# Patient Record
Sex: Female | Born: 1960 | Race: Black or African American | Hispanic: No | Smoking: Current every day smoker
Health system: Southern US, Community
[De-identification: ages and names within clinical notes are randomized; demographics above are authoritative.]

## PROBLEM LIST (undated history)

## (undated) DIAGNOSIS — F259 Schizoaffective disorder, unspecified: Secondary | ICD-10-CM

## (undated) DIAGNOSIS — G43909 Migraine, unspecified, not intractable, without status migrainosus: Secondary | ICD-10-CM

## (undated) DIAGNOSIS — T7840XA Allergy, unspecified, initial encounter: Secondary | ICD-10-CM

## (undated) DIAGNOSIS — I10 Essential (primary) hypertension: Secondary | ICD-10-CM

## (undated) DIAGNOSIS — K219 Gastro-esophageal reflux disease without esophagitis: Secondary | ICD-10-CM

## (undated) DIAGNOSIS — M1990 Unspecified osteoarthritis, unspecified site: Secondary | ICD-10-CM

## (undated) DIAGNOSIS — M5430 Sciatica, unspecified side: Secondary | ICD-10-CM

## (undated) DIAGNOSIS — J449 Chronic obstructive pulmonary disease, unspecified: Secondary | ICD-10-CM

## (undated) DIAGNOSIS — B1920 Unspecified viral hepatitis C without hepatic coma: Secondary | ICD-10-CM

## (undated) DIAGNOSIS — J45909 Unspecified asthma, uncomplicated: Secondary | ICD-10-CM

## (undated) DIAGNOSIS — F32A Depression, unspecified: Secondary | ICD-10-CM

## (undated) DIAGNOSIS — F419 Anxiety disorder, unspecified: Secondary | ICD-10-CM

## (undated) DIAGNOSIS — L659 Nonscarring hair loss, unspecified: Secondary | ICD-10-CM

## (undated) DIAGNOSIS — H409 Unspecified glaucoma: Secondary | ICD-10-CM

## (undated) DIAGNOSIS — F329 Major depressive disorder, single episode, unspecified: Secondary | ICD-10-CM

## (undated) DIAGNOSIS — D509 Iron deficiency anemia, unspecified: Secondary | ICD-10-CM

## (undated) DIAGNOSIS — R569 Unspecified convulsions: Secondary | ICD-10-CM

## (undated) DIAGNOSIS — IMO0002 Reserved for concepts with insufficient information to code with codable children: Secondary | ICD-10-CM

## (undated) DIAGNOSIS — H269 Unspecified cataract: Secondary | ICD-10-CM

## (undated) DIAGNOSIS — F1910 Other psychoactive substance abuse, uncomplicated: Secondary | ICD-10-CM

## (undated) DIAGNOSIS — IMO0001 Reserved for inherently not codable concepts without codable children: Secondary | ICD-10-CM

## (undated) HISTORY — DX: Reserved for concepts with insufficient information to code with codable children: IMO0002

## (undated) HISTORY — PX: COLONOSCOPY W/ BIOPSIES: SHX1374

## (undated) HISTORY — DX: Iron deficiency anemia, unspecified: D50.9

## (undated) HISTORY — DX: Unspecified glaucoma: H40.9

## (undated) HISTORY — DX: Nonscarring hair loss, unspecified: L65.9

## (undated) HISTORY — DX: Other psychoactive substance abuse, uncomplicated: F19.10

## (undated) HISTORY — DX: Essential (primary) hypertension: I10

## (undated) HISTORY — PX: GYNECOLOGIC CRYOSURGERY: SHX857

## (undated) HISTORY — DX: Unspecified cataract: H26.9

## (undated) HISTORY — PX: EXPLORATORY LAPAROTOMY: SUR591

## (undated) HISTORY — PX: COLONOSCOPY: SHX174

## (undated) HISTORY — PX: ABDOMINAL HYSTERECTOMY: SHX81

## (undated) HISTORY — DX: Depression, unspecified: F32.A

## (undated) HISTORY — DX: Allergy, unspecified, initial encounter: T78.40XA

## (undated) HISTORY — PX: POLYPECTOMY: SHX149

## (undated) HISTORY — DX: Major depressive disorder, single episode, unspecified: F32.9

## (undated) HISTORY — DX: Anxiety disorder, unspecified: F41.9

## (undated) HISTORY — PX: DILATION AND CURETTAGE OF UTERUS: SHX78

---

## 2006-12-13 HISTORY — PX: OVARIAN CYST REMOVAL: SHX89

## 2011-06-20 ENCOUNTER — Encounter: Payer: Self-pay | Admitting: Emergency Medicine

## 2011-06-20 ENCOUNTER — Emergency Department (HOSPITAL_BASED_OUTPATIENT_CLINIC_OR_DEPARTMENT_OTHER)
Admission: EM | Admit: 2011-06-20 | Discharge: 2011-06-20 | Disposition: A | Discharge status: Home / Self Care | Payer: Medicare Other | Attending: Emergency Medicine | Admitting: Emergency Medicine

## 2011-06-20 DIAGNOSIS — J45909 Unspecified asthma, uncomplicated: Secondary | ICD-10-CM | POA: Insufficient documentation

## 2011-06-20 DIAGNOSIS — Z8739 Personal history of other diseases of the musculoskeletal system and connective tissue: Secondary | ICD-10-CM | POA: Insufficient documentation

## 2011-06-20 DIAGNOSIS — M549 Dorsalgia, unspecified: Secondary | ICD-10-CM | POA: Insufficient documentation

## 2011-06-20 DIAGNOSIS — N390 Urinary tract infection, site not specified: Secondary | ICD-10-CM

## 2011-06-20 HISTORY — DX: Unspecified osteoarthritis, unspecified site: M19.90

## 2011-06-20 HISTORY — DX: Unspecified convulsions: R56.9

## 2011-06-20 HISTORY — DX: Unspecified viral hepatitis C without hepatic coma: B19.20

## 2011-06-20 HISTORY — DX: Sciatica, unspecified side: M54.30

## 2011-06-20 LAB — URINALYSIS, ROUTINE W REFLEX MICROSCOPIC
Ketones, ur: NEGATIVE mg/dL
Nitrite: NEGATIVE
Specific Gravity, Urine: 1.024 (ref 1.005–1.030)
pH: 5.5 (ref 5.0–8.0)

## 2011-06-20 LAB — URINE MICROSCOPIC-ADD ON

## 2011-06-20 MED ORDER — OXYCODONE-ACETAMINOPHEN 5-325 MG PO TABS
2.0000 | ORAL_TABLET | ORAL | Status: AC | PRN
Start: 2011-06-20 — End: 2011-06-30

## 2011-06-20 MED ORDER — NITROFURANTOIN MONOHYD MACRO 100 MG PO CAPS: 100.0000 mg | ORAL_CAPSULE | Freq: Two times a day (BID) | ORAL | Status: DC

## 2011-06-20 MED ORDER — NITROFURANTOIN MONOHYD MACRO 100 MG PO CAPS: 100.0000 mg | ORAL_CAPSULE | Freq: Two times a day (BID) | ORAL | Status: AC

## 2011-06-20 NOTE — ED Provider Notes (Addendum)
History     Chief Complaint  Patient presents with  . Back Pain   HPI Comments: Pt complains of pain in her left low back.  Pt reports she has had sciatica and this feels similar.  Pt has some burning with urination.  No local MD.  Pt recently moved here.  Patient is a 50 y.o. female presenting with back pain. The history is provided by the patient. The history is limited by a language barrier.  Back Pain  The current episode started 2 days ago. The problem occurs constantly. The problem has been gradually worsening. The pain is associated with no known injury. The pain is present in the lumbar spine. Associated symptoms include dysuria. She has tried nothing for the symptoms.    Past Medical History  Diagnosis Date  . Sciatic pain   . Seizures   . Schizoaffective disorder   . Hepatitis C   . Arthritis   . Asthma     Past Surgical History  Procedure Date  . Abdominal hysterectomy   . Dilation and curettage of uterus   . Exploratory laparotomy     History reviewed. No pertinent family history.  History  Substance Use Topics  . Smoking status: Current Everyday Smoker  . Smokeless tobacco: Not on file  . Alcohol Use: Yes     occ    OB History    Grav Para Term Preterm Abortions TAB SAB Ect Mult Living                  Review of Systems  Genitourinary: Positive for dysuria.  Musculoskeletal: Positive for back pain.    Physical Exam  BP 134/72  Pulse 73  Temp(Src) 97.7 F (36.5 C) (Oral)  Resp 16  SpO2 98%  Physical Exam  Constitutional: She is oriented to person, place, and time. She appears well-developed and well-nourished.  HENT:  Head: Normocephalic.  Eyes: Pupils are equal, round, and reactive to light.  Neck: Normal range of motion.  Cardiovascular: Normal rate.   Pulmonary/Chest: Effort normal.  Abdominal: Soft. There is tenderness.  Musculoskeletal: She exhibits tenderness.  Neurological: She is alert and oriented to person, place, and time.    Skin: Skin is warm.  Psychiatric: She has a normal mood and affect.    ED Course  Procedures  MDM will refer pt for local md and urine shows uti.  I will treat with macrobid and percocet      Langston Masker, [place] 06/20/11 2139  Langston Masker, [place] 06/28/11 2327

## 2011-11-10 ENCOUNTER — Emergency Department (EMERGENCY_DEPARTMENT_HOSPITAL)
Admission: EM | Admit: 2011-11-10 | Discharge: 2011-11-11 | Disposition: A | Discharge status: Other Acute Inpatient Hospital | Payer: Medicare Other | Source: Home / Self Care | Attending: Emergency Medicine | Admitting: Emergency Medicine

## 2011-11-10 ENCOUNTER — Encounter (HOSPITAL_COMMUNITY): Payer: Self-pay

## 2011-11-10 DIAGNOSIS — R748 Abnormal levels of other serum enzymes: Secondary | ICD-10-CM

## 2011-11-10 DIAGNOSIS — F1010 Alcohol abuse, uncomplicated: Secondary | ICD-10-CM
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
## 2011-11-11 ENCOUNTER — Encounter (HOSPITAL_COMMUNITY): Payer: Self-pay | Admitting: Behavioral Health

## 2011-11-11 ENCOUNTER — Inpatient Hospital Stay (HOSPITAL_COMMUNITY)
Admission: AD | Admit: 2011-11-11 | Discharge: 2011-11-16 | DRG: 885 | Disposition: A | Discharge status: Home / Self Care | Payer: Medicare Other | Attending: Psychiatry | Admitting: Psychiatry

## 2011-11-11 DIAGNOSIS — F1221 Cannabis dependence, in remission: Secondary | ICD-10-CM

## 2011-11-11 DIAGNOSIS — G40909 Epilepsy, unspecified, not intractable, without status epilepticus: Secondary | ICD-10-CM

## 2011-11-11 DIAGNOSIS — F1910 Other psychoactive substance abuse, uncomplicated: Secondary | ICD-10-CM | POA: Diagnosis present

## 2011-11-11 DIAGNOSIS — Z9181 History of falling: Secondary | ICD-10-CM

## 2011-11-11 DIAGNOSIS — F259 Schizoaffective disorder, unspecified: Principal | ICD-10-CM

## 2011-11-11 DIAGNOSIS — F1010 Alcohol abuse, uncomplicated: Secondary | ICD-10-CM

## 2011-11-11 DIAGNOSIS — F1210 Cannabis abuse, uncomplicated: Secondary | ICD-10-CM

## 2011-11-11 LAB — COMPREHENSIVE METABOLIC PANEL
BUN: 9 mg/dL (ref 6–23)
Calcium: 9.8 mg/dL (ref 8.4–10.5)
GFR calc Af Amer: >90 mL/min (ref >90)
Glucose, Bld: 91 mg/dL (ref 70–99)
Sodium: 138 mEq/L (ref 135–145)
Total Protein: 8.4 g/dL — ABNORMAL HIGH (ref 6.0–8.3)

## 2011-11-11 LAB — CBC
Hemoglobin: 12.3 g/dL (ref 12.0–15.0)
MCH: 32.5 pg (ref 26.0–34.0)
MCHC: 34 g/dL (ref 30.0–36.0)
RDW: 14 % (ref 11.5–15.5)

## 2011-11-11 LAB — RAPID URINE DRUG SCREEN, HOSP PERFORMED
Cocaine: NOT DETECTED
Opiates: NOT DETECTED

## 2011-11-11 LAB — VALPROIC ACID LEVEL: Valproic Acid Lvl: <10 ug/mL — ABNORMAL LOW (ref 50.0–100.0)

## 2011-11-11 LAB — ETHANOL: Alcohol, Ethyl (B): 129 mg/dL — ABNORMAL HIGH (ref 0–11)

## 2011-11-11 MED ORDER — QUETIAPINE FUMARATE 100 MG PO TABS
300.0000 mg | ORAL_TABLET | Freq: Two times a day (BID) | ORAL | Status: DC
  Administered 2011-11-11: 300 mg via ORAL
  Filled 2011-11-11: qty 3

## 2011-11-11 MED ORDER — AMLODIPINE BESYLATE 10 MG PO TABS
10.0000 mg | ORAL_TABLET | Freq: Every day | ORAL | Status: DC
  Administered 2011-11-11: 10 mg via ORAL
  Filled 2011-11-11: qty 1

## 2011-11-11 MED ORDER — NAPROXEN 375 MG PO TABS
375.0000 mg | ORAL_TABLET | Freq: Two times a day (BID) | ORAL | Status: DC
  Administered 2011-11-11: 375 mg via ORAL
  Filled 2011-11-11 (×2): qty 1

## 2011-11-11 MED ORDER — ALBUTEROL 90 MCG/ACT IN AERS
2.0000 | INHALATION_SPRAY | Freq: Two times a day (BID) | RESPIRATORY_TRACT | Status: DC
  Administered 2011-11-11: 2 via RESPIRATORY_TRACT

## 2011-11-11 MED ORDER — ONDANSETRON 4 MG PO TBDP: 4.0000 mg | ORAL_TABLET | Freq: Four times a day (QID) | ORAL | Status: AC | PRN

## 2011-11-11 MED ORDER — PNEUMOCOCCAL VAC POLYVALENT 25 MCG/0.5ML IJ INJ
0.5000 mL | INJECTION | INTRAMUSCULAR | Status: AC
  Administered 2011-11-12: 0.5 mL via INTRAMUSCULAR

## 2011-11-11 MED ORDER — CHLORDIAZEPOXIDE HCL 25 MG PO CAPS
50.0000 mg | ORAL_CAPSULE | Freq: Once | ORAL | Status: AC
  Administered 2011-11-11: 50 mg via ORAL
  Filled 2011-11-11: qty 2

## 2011-11-11 MED ORDER — ONDANSETRON HCL 4 MG PO TABS: 4.0000 mg | ORAL_TABLET | Freq: Three times a day (TID) | ORAL | Status: DC | PRN

## 2011-11-11 MED ORDER — NICOTINE 21 MG/24HR TD PT24
21.0000 mg | MEDICATED_PATCH | Freq: Every day | TRANSDERMAL | Status: DC
  Administered 2011-11-12 – 2011-11-16 (×5): 21 mg via TRANSDERMAL
  Filled 2011-11-11 (×8): qty 1

## 2011-11-11 MED ORDER — ALUM & MAG HYDROXIDE-SIMETH 200-200-20 MG/5ML PO SUSP: 30.0000 mL | ORAL | Status: DC | PRN

## 2011-11-11 MED ORDER — DIVALPROEX SODIUM ER 500 MG PO TB24
500.0000 mg | ORAL_TABLET | Freq: Every day | ORAL | Status: DC
  Administered 2011-11-12 – 2011-11-15 (×4): 500 mg via ORAL
  Filled 2011-11-11 (×5): qty 1

## 2011-11-11 MED ORDER — THIAMINE HCL 100 MG/ML IJ SOLN: 100.0000 mg | Freq: Once | INTRAMUSCULAR | Status: DC

## 2011-11-11 MED ORDER — VITAMIN B-1 100 MG PO TABS
100.0000 mg | ORAL_TABLET | Freq: Every day | ORAL | Status: DC
  Administered 2011-11-12 – 2011-11-16 (×5): 100 mg via ORAL
  Filled 2011-11-11 (×8): qty 1

## 2011-11-11 MED ORDER — ACETAMINOPHEN 325 MG PO TABS
650.0000 mg | ORAL_TABLET | Freq: Four times a day (QID) | ORAL | Status: DC | PRN
  Administered 2011-11-11 – 2011-11-15 (×4): 650 mg via ORAL

## 2011-11-11 MED ORDER — ZOLPIDEM TARTRATE 5 MG PO TABS: 5.0000 mg | ORAL_TABLET | Freq: Every evening | ORAL | Status: DC | PRN

## 2011-11-11 MED ORDER — DIVALPROEX SODIUM ER 500 MG PO TB24: 500.0000 mg | ORAL_TABLET | Freq: Once | ORAL | Status: DC

## 2011-11-11 MED ORDER — QUETIAPINE FUMARATE 300 MG PO TABS
300.0000 mg | ORAL_TABLET | Freq: Once | ORAL | Status: AC
  Administered 2011-11-11: 300 mg via ORAL
  Filled 2011-11-11: qty 1

## 2011-11-11 MED ORDER — HYDROXYZINE PAMOATE 25 MG PO CAPS: 25.0000 mg | ORAL_CAPSULE | Freq: Four times a day (QID) | ORAL | Status: AC | PRN

## 2011-11-11 MED ORDER — NICOTINE 21 MG/24HR TD PT24
21.0000 mg | MEDICATED_PATCH | Freq: Every day | TRANSDERMAL | Status: DC
Start: 2011-11-11 — End: 2011-11-11

## 2011-11-11 MED ORDER — ALBUTEROL SULFATE HFA 108 (90 BASE) MCG/ACT IN AERS
2.0000 | INHALATION_SPRAY | Freq: Two times a day (BID) | RESPIRATORY_TRACT | Status: DC
  Administered 2011-11-11: 2 via RESPIRATORY_TRACT
  Filled 2011-11-11: qty 6.7

## 2011-11-11 MED ORDER — DIVALPROEX SODIUM ER 500 MG PO TB24: 1000.0000 mg | ORAL_TABLET | Freq: Once | ORAL | Status: DC

## 2011-11-11 MED ORDER — DIVALPROEX SODIUM ER 500 MG PO TB24
1000.0000 mg | ORAL_TABLET | Freq: Once | ORAL | Status: AC
  Administered 2011-11-11: 1000 mg via ORAL
  Filled 2011-11-11: qty 2

## 2011-11-11 MED ORDER — CHLORDIAZEPOXIDE HCL 25 MG PO CAPS
25.0000 mg | ORAL_CAPSULE | Freq: Four times a day (QID) | ORAL | Status: AC | PRN
  Administered 2011-11-12 – 2011-11-14 (×3): 25 mg via ORAL
  Filled 2011-11-11 (×3): qty 1

## 2011-11-11 MED ORDER — LORAZEPAM 1 MG PO TABS: 1.0000 mg | ORAL_TABLET | Freq: Three times a day (TID) | ORAL | Status: DC | PRN

## 2011-11-11 MED ORDER — LOPERAMIDE HCL 2 MG PO CAPS: 2.0000 mg | ORAL_CAPSULE | ORAL | Status: AC | PRN

## 2011-11-11 MED ORDER — THERA M PLUS PO TABS
1.0000 | ORAL_TABLET | Freq: Every day | ORAL | Status: DC
  Administered 2011-11-12 – 2011-11-16 (×5): 1 via ORAL
  Filled 2011-11-11 (×6): qty 1

## 2011-11-11 MED ORDER — MAGNESIUM HYDROXIDE 400 MG/5ML PO SUSP: 30.0000 mL | Freq: Every day | ORAL | Status: DC | PRN

## 2011-11-11 NOTE — Discharge Planning (Signed)
Patient has been accepted to [facility], Dr. Koren Shiver, room 302-1. Patient's support paperwork has been completed and faxed to [facility].   Ileene Hutchinson , MSW, LCSWA 11/11/2011 11:38 AM

## 2011-11-11 NOTE — ED Provider Notes (Addendum)
She is alert, cooperative. She is not expressing any agitation. She will be assessed later this morning. By the psychiatrist.  Flint Melter, MD 11/11/11 [phone]  At this time. The patient complains of "sciatica". He is requesting Naprosyn for the pain has been there for several years. It is ordered.  Flint Melter, MD 11/11/11 1011
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
## 2011-11-11 NOTE — ED Notes (Signed)
Melissa Bridges Act in at bs with pt

## 2011-11-11 NOTE — ED Notes (Signed)
Pt is currently sleeping and is in no distress

## 2011-11-11 NOTE — ED Notes (Signed)
Attempted to call report.  Nurse not available.  Left message for her to call me back.
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
## 2011-11-11 NOTE — ED Notes (Signed)
Telepsych completed.  

## 2011-11-11 NOTE — ED Notes (Signed)
Attempted to call report to [facility].  Nurse unable to take report at this time but will call back ASAP.
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
## 2011-11-12 MED ORDER — QUETIAPINE FUMARATE 300 MG PO TABS
300.0000 mg | ORAL_TABLET | Freq: Two times a day (BID) | ORAL | Status: DC
  Administered 2011-11-12 – 2011-11-16 (×8): 300 mg via ORAL
  Filled 2011-11-12: qty 14
  Filled 2011-11-12 (×2): qty 1
  Filled 2011-11-12: qty 14
  Filled 2011-11-12 (×9): qty 1

## 2011-11-12 MED FILL — Albuterol Sulfate Inhal Aero 108 MCG/ACT (90MCG Base Equiv): RESPIRATORY_TRACT | Qty: 6.7 | Status: AC
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
## 2011-11-12 NOTE — Progress Notes (Deleted)
[facility] Adult Inpatient Family/Significant Other Suicide Prevention Education  Suicide Prevention Education:  Education Completed; Melissa Bridges, [phone] has been identified by the patient as the family member/significant other with whom the patient will be residing, and identified as the person(s) who will aid the patient in the event of a mental health crisis (suicidal ideations/suicide attempt).  With written consent from the patient, the family member/significant other has been provided the following suicide prevention education, prior to the and/or following the discharge of the patient.  The suicide prevention education provided includes the following:  Suicide risk factors  Suicide prevention and interventions  National Suicide Hotline telephone number  [facility] assessment telephone number  [facility] Emergency Assistance 911  [facility] and/or Residential Mobile Crisis Unit telephone number  Request made of family/significant other to:  Remove weapons (e.g., guns, rifles, knives), all items previously/currently identified as safety concern.    Remove drugs/medications (over-the-counter, prescriptions, illicit drugs), all items previously/currently identified as a safety concern.  Mr Webb Silversmith states he will secure all of patient's meds which she currently has in the home in his gun safe which he carries the keys to on his person 24/7  The family member/significant other verbalizes understanding of the suicide prevention education information provided.  The family member/significant other agrees to remove the items of safety concern listed above.  Clide Dales 11/12/2011, 4:49 PM

## 2011-11-12 NOTE — Progress Notes (Signed)
Patient ID: Melissa Bridges, female   DOB: 1960/12/20, 50 y.o.   MRN: [id number] Pt is lying in bed this AM. Pt affect is sad/flat and mood is withdrawn. Pt denies SI/HI and A/V hallucinations. Pt is attending group but is not participating. She states that she does not wish to share personal information with others. Writer will continue to monitor.
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
## 2011-11-12 NOTE — Progress Notes (Signed)
[facility] Group Notes:  (Counselor/Nursing/MHT/Case Management/Adjunct)  11/12/2011 3:48 PM  Type of Therapy:  Group Processing at 11   Participation Level:  Did Not Attend  Type of Therapy:  Group Processing at 1:15  Participation Level:  Pt did not attend group other than the last 5 minutes which was too late to involve in discussion    Clide Dales 11/12/2011, 3:48 PM

## 2011-11-12 NOTE — Progress Notes (Signed)
Pt attended AM group.  Declined to participate.  "I am not comfortable with other people knowing my business."  Later in treatment team pt stated she came here to get away from an impossible situation with her daughter, and is hopeful that she can get her own place.  Gave her a list of affordable housing in [place].  See tx plan for more information.

## 2011-11-12 NOTE — Progress Notes (Signed)
Pt positive for group, appropriate on unit, no complaints voiced.  Pt spoke on phone with daughter for quite some time and was pleased by the conversation.  Pt does wish to speak with doctor about her blood pressure medications tomorrow.  Denies SI/HI/hallucinations, bright on approach.  Will continue to monitor.

## 2011-11-13 DIAGNOSIS — F1210 Cannabis abuse, uncomplicated: Secondary | ICD-10-CM

## 2011-11-13 DIAGNOSIS — G40909 Epilepsy, unspecified, not intractable, without status epilepticus: Secondary | ICD-10-CM

## 2011-11-13 DIAGNOSIS — F1221 Cannabis dependence, in remission: Secondary | ICD-10-CM

## 2011-11-13 DIAGNOSIS — F1010 Alcohol abuse, uncomplicated: Secondary | ICD-10-CM

## 2011-11-13 DIAGNOSIS — F259 Schizoaffective disorder, unspecified: Principal | ICD-10-CM

## 2011-11-13 NOTE — Progress Notes (Signed)
Pt is out in milieu interacting with peers and attending groups with minimal participation. Rates depression and hopelessness at 6 and states she is here "because her daughter's put her here.  I got off my meds." Minimizing SA issues.  Focused on physical issues. Reports poor sleep,low energy, and improving attention span.  Patient inventory reports "pain in my bones" but did not verbalize this to RN.  Denies SI/HI. Compliant with schedule medications and 15' checks cont for safety.

## 2011-11-13 NOTE — Progress Notes (Signed)
  Patient ID: Melissa Bridges, female   DOB: 12-15-1960, 50 y.o.   MRN: [id number]  Pt. Came in late to the aftercare planning group.Pt accept NA and AA meeting schedules.

## 2011-11-13 NOTE — Progress Notes (Signed)
Pt positive for group, interacting appropriately within milieu.  No complaints voiced.  Pt bright on approach, pleasant.  Denies SI/HI/hallucinations.  Support and encouragement offered.  Will continue to monitor.

## 2011-11-13 NOTE — Progress Notes (Signed)
[facility] Group Notes:  (Counselor/Nursing/MHT/Case Management/Adjunct)  11/13/2011 1315PM  Type of Therapy:  Psychoeducational Skills  Participation Level:  Active  Participation Quality:  Appropriate  Affect:  Appropriate  Cognitive:  Appropriate  Insight:  Good  Engagement in Group:  Good  Engagement in Therapy:  Good  Modes of Intervention:  Clarification, Problem-solving and Support  Summary of Progress/Problems: Pt. chose to color blue to describe her feeling of being down today. In order for pt. To increase her self care and decrease her self-sabotage, pt. committed herself to focus more on what makes her happy, and find her identity. Pt. stated that she feels as though she is doing what others want her to do instead of what she wants for herself.    Crosswell, Desiree 11/13/2011, 3:39 PM

## 2011-11-13 NOTE — Progress Notes (Signed)
Melissa Bridges is a 50 y.o. female [id number] [birth year] Dx: Schizoaffective D/O, Alcohol abuse, Cocaine Dependence in remission, Cannabis Abuse Subjective/Objective: [name] is up and dressed and active in the groups and in milieu.  She states she slept well last night almost 5 hours.  She reports her appetite is good but she only drank liquids this morning as she is not very hungry.  She reports no withdrawal symptoms and has no new complaints.  She states her depression is a5/10 or and reports no anxiety today. She reveals that she is grieving the loss of several friends and relatives over the last year and notes that is a stumbling block to her recovery.  She is motivated today as she went to an AA meeting this morning and is motivated to return to sobriety. [name] animated in her discussions of AA/NA/CA meetings in [place] and [place].  She states they were often very crowded, but always helpful to  her.   She has also spoken with her daughter whom she reports is pleased with her progress and now [name] is considering returning to her daughter's house upon discharge. She denies AH/VH states no SI/HI.  Her speech is clear and goal directed. Her insight is good as is her judgement. We will continue the same plan of care with no changes at this time. Assessment/Plan:   We will continue the same plan of care with no changes at this time.   Filed Vitals:   11/13/11 1146  BP: 120/85  Pulse: 98  Temp:   Resp: 16    Lab Results: Review of her labs upon admission the patient was minimally hypokalemic and also had elevated AST/ALT.  Urine was Was likely contaminated.  Will continue to monitor and recheck when Depakote level is rechecked.   BMET    Component Value Date/Time   NA 138 11/10/2011 2359   K 3.3* 11/10/2011 2359   CL 98 11/10/2011 2359   CO2 30 11/10/2011 2359   GLUCOSE 91 11/10/2011 2359   BUN 9 11/10/2011 2359   CREATININE 0.84 11/10/2011 2359   CALCIUM 9.8 11/10/2011 2359   GFRNONAA 79* 11/10/2011 2359   GFRAA >90 11/10/2011 2359    Medications:  Scheduled:     . divalproex  500 mg Oral Daily  . multivitamins ther. w/minerals  1 tablet Oral Daily  . nicotine  21 mg Transdermal Q0600  . QUEtiapine  300 mg Oral BID  . thiamine  100 mg Intramuscular Once  . thiamine  100 mg Oral Daily     PRN Meds acetaminophen, alum & mag hydroxide-simeth, chlordiazePOXIDE, hydrOXYzine, loperamide, magnesium hydroxide, ondansetron   Lloyd Huger T. Monzerat Handler [facility] 11/13/2011

## 2011-11-14 DIAGNOSIS — F1910 Other psychoactive substance abuse, uncomplicated: Secondary | ICD-10-CM | POA: Diagnosis present

## 2011-11-14 NOTE — Progress Notes (Signed)
[facility] Group Notes:  (Counselor/Nursing/MHT/Case Management/Adjunct)  11/14/2011 10:27 PM  Type of Therapy:  Psychoeducational Skills  Participation Level:  Minimal  Participation Quality:  Resistant  Affect:  Irritable  Cognitive:  Confused  Insight:  None  Engagement in Group:  Limited  Engagement in Therapy:  n/a  Modes of Intervention:  Activity, Education, Problem-solving, Socialization and Support  Summary of Progress/Problems:  Jeydi participated with little to no insight in group activity labeling themselves and their peers. Piya was somewhat active in discussion defining what a label is, what the effects of labeling people are, and listing positive and negative labels they have been called or callled themselves in the past but was unable to completely understand the subject. Leinaala was asked to complete a worksheet listing labels they have had and the reality of the label.    Wandra Scot 11/14/2011, 10:27 PM

## 2011-11-14 NOTE — Progress Notes (Signed)
Pt is out in milieu most of shift. Cont to c/o agitation and cravings and is observably restless. Also cont to c/o back pain. PRN meds requested and received but pt was upset that provider would not order an alternative. Rates depression at 4 and hopelessness at 6. Denies SI. Support offered and 15' checks cont for safety.

## 2011-11-14 NOTE — Progress Notes (Signed)
Patient ID: Melissa Bridges, female   DOB: November 18, 1961, 51 y.o.   MRN: [id number]  Pt. attended and participated in aftercare planning group. Pt. Stated that she slept poorly last night because of her physical pain and being awakened every 15 minutes for checks. Pt did not share any information in regards to D/C.   Ginelle Bays L. [employer]

## 2011-11-14 NOTE — Progress Notes (Signed)
Pt pleasant and bright on approach, positive for wrap-up group.  No complaints voiced, denies SI/HI/hallucinations at this time.  Support and encouragement offered, will continue to monitor.

## 2011-11-14 NOTE — Progress Notes (Signed)
Melissa Bridges is a 50 y.o. female [id number] 04/14/1961  Subjective/Objective:    PT.  Is up and active in the group milieu.  She states she slept 4 hours last night.  She is eating today and ate well. Ysabela does report her back is hurting her and that the Naproxyn didn't help.  We will try an ice pack. She states her sciatica is painful and she appears in discomfort and shifts in her seat.  She is cooperative and informative.  Shannan states she doesn't think she wants to go into rehab when she leaves here but will consider it over the next day.  Speech is fair, but clear, due to poor dentition, goal directed and coherent.  She denies SI/HI voices no complaints of WD sx other than her sciatica. No AH/ VH.   Assessment/Plan:    Continue current plan of care, include ice pack for sciatica, and will consider rehab upon D/C.  Pt. States she could live with her mother or either of her sisters if she does not return to her daughter's home.  Will need to have a family meeting prior to d/c and Jamyia is ok with [place] contacting her daughter.   Filed Vitals:   11/14/11 0631  BP: 126/86  Pulse: 95  Temp:   Resp:     Lab Results:   BMET    Component Value Date/Time   NA 138 11/10/2011 2359   K 3.3* 11/10/2011 2359   CL 98 11/10/2011 2359   CO2 30 11/10/2011 2359   GLUCOSE 91 11/10/2011 2359   BUN 9 11/10/2011 2359   CREATININE 0.84 11/10/2011 2359   CALCIUM 9.8 11/10/2011 2359   GFRNONAA 79* 11/10/2011 2359   GFRAA >90 11/10/2011 2359    Medications:  Scheduled:     . divalproex  500 mg Oral Daily  . multivitamins ther. w/minerals  1 tablet Oral Daily  . nicotine  21 mg Transdermal Q0600  . QUEtiapine  300 mg Oral BID  . thiamine  100 mg Intramuscular Once  . thiamine  100 mg Oral Daily     PRN Meds acetaminophen, alum & mag hydroxide-simeth, chlordiazePOXIDE, hydrOXYzine, loperamide, magnesium hydroxide, ondansetron   Lloyd Huger T. Yaiden Yang [facility] 11/14/2011

## 2011-11-14 NOTE — Progress Notes (Signed)
[facility] Group Notes:  (Counselor/Nursing/MHT/Case Management/Adjunct)  11/14/2011 2:56 PM  Type of Therapy:  group therapy  Participation Level:  Minimal  Participation Quality:  Resistant  Affect:  Irritable  Cognitive:  Oriented  Insight:  Limited  Engagement in Group:  Limited  Engagement in Therapy:  Limited  Modes of Intervention:  Problem-solving, Support and exploration  Summary of Progress/Problems: Pt stated she had no comment. Pt was sleeping on and off in group. Pt was able to stay that one thing she will do to support herself is look for a place of her own.   Purcell Nails 11/14/2011, 2:56 PM

## 2011-11-15 MED ORDER — DIVALPROEX SODIUM ER 500 MG PO TB24
1000.0000 mg | ORAL_TABLET | Freq: Every day | ORAL | Status: DC
  Administered 2011-11-16: 1000 mg via ORAL
  Filled 2011-11-15: qty 28
  Filled 2011-11-15 (×2): qty 2

## 2011-11-15 MED ORDER — QUETIAPINE FUMARATE 300 MG PO TABS
300.0000 mg | ORAL_TABLET | Freq: Once | ORAL | Status: AC
  Administered 2011-11-15: 300 mg via ORAL

## 2011-11-15 NOTE — Progress Notes (Signed)
Pt is pleasant and cooperative. Pt seems to have good insight and have a good plan when she leaves here.Pt rates depression at a 4 and hopelessness at a 2. Pt attends groups and interacts well with peers and staff. Pt was offered support and encouragement. Pt denies SI/HI. Pt is receptive to treatment and safety is maintained on unit.

## 2011-11-15 NOTE — Progress Notes (Signed)
[facility] Group Notes:  (Counselor/Nursing/MHT/Case Management/Adjunct)  11/15/2011 4:45 PM  Type of Therapy:  Group therapy at 11:00am  Participation Level:  Minimal  Participation Quality:  Inattentive  Affect:  Irritable  Cognitive:  Oriented  Insight:  Limited  Engagement in Group:  Limited  Engagement in Therapy:  Limited  Modes of Intervention:  Clarification, Orientation, Socialization and Support  Summary of Progress/Problems:Counselor introduced topic of overcoming obstacles and asked patients to share recent success in dealing with obstacles in any area of life. After a few examples members then shared an obstacle to recovery/wellness they may face upon discharge. The list included feelings, situations, specific people and old behaviors they anticipated difficulty with. Patient was inattentive and quiet.   Melissa Bridges 11/15/2011, 4:45 PM  [facility] Group Notes:  (Counselor/Nursing/MHT/Case Management/Adjunct)  11/15/2011 4:47 PM  Type of Therapy:  Group therapy at 1:15pm  Participation Level:  Active  Participation Quality: Attentive   Affect:  Appropriate  Cognitive:  Appropriate  Insight:  Limited  Engagement in Group:  Good  Engagement in Therapy:  Good  Modes of Intervention:  Clarification, Socialization, Support and information  Summary of Progress/Problems:[name]  Volunteer from [employer] came in to share her story and provide information regarding peer led services at [facility] and group support meetings. Krislyn expressed interest in Lauren's story and shared that she was feeling better as meds had worn off ' but hey it's probably ready to do again" Pt also shared gratitude for God's love and stated she is past ready to discharge.   Melissa Bridges 11/15/2011, 4:47 PM
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
## 2011-11-15 NOTE — Progress Notes (Signed)
Pt attended morning d/c planning group. Good mood. Says daughter, granddaughter visited yesterday, and it was good to see them. Plan for d/c is to stay in [place] area, attend AA meetings and to continue to stay on meds. Will follow up with [facility] after d/c.  Hoping to D/C today.

## 2011-11-16 MED ORDER — CITALOPRAM HYDROBROMIDE 10 MG PO TABS: 10.0000 mg | ORAL_TABLET | Freq: Every day | ORAL | Status: DC

## 2011-11-16 MED ORDER — METHOCARBAMOL 500 MG PO TABS: 750.0000 mg | ORAL_TABLET | Freq: Two times a day (BID) | ORAL | Status: DC | PRN

## 2011-11-16 MED ORDER — LOPERAMIDE HCL 2 MG PO CAPS: 2.0000 mg | ORAL_CAPSULE | ORAL | Status: DC | PRN

## 2011-11-16 MED ORDER — DIVALPROEX SODIUM ER 500 MG PO TB24: 1000.0000 mg | ORAL_TABLET | Freq: Every day | ORAL | Status: DC

## 2011-11-16 MED ORDER — VITAMIN B-1 100 MG PO TABS
100.0000 mg | ORAL_TABLET | Freq: Every day | ORAL | Status: DC
  Filled 2011-11-16 (×2): qty 1

## 2011-11-16 MED ORDER — THIAMINE HCL 100 MG/ML IJ SOLN: 100.0000 mg | Freq: Once | INTRAMUSCULAR | Status: DC

## 2011-11-16 MED ORDER — ONDANSETRON 4 MG PO TBDP: 4.0000 mg | ORAL_TABLET | Freq: Four times a day (QID) | ORAL | Status: DC | PRN

## 2011-11-16 MED ORDER — THERA M PLUS PO TABS
1.0000 | ORAL_TABLET | Freq: Every day | ORAL | Status: DC
Start: 2011-11-16 — End: 2011-11-16
  Filled 2011-11-16: qty 1

## 2011-11-16 MED ORDER — HYDROXYZINE HCL 25 MG PO TABS: 25.0000 mg | ORAL_TABLET | Freq: Four times a day (QID) | ORAL | Status: DC | PRN

## 2011-11-16 MED ORDER — CITALOPRAM HYDROBROMIDE 10 MG PO TABS
10.0000 mg | ORAL_TABLET | Freq: Every day | ORAL | Status: DC
  Administered 2011-11-16: 10 mg via ORAL
  Filled 2011-11-16: qty 0.5
  Filled 2011-11-16: qty 1
  Filled 2011-11-16: qty 0.5
  Filled 2011-11-16: qty 1

## 2011-11-16 MED ORDER — CHLORDIAZEPOXIDE HCL 25 MG PO CAPS: 25.0000 mg | ORAL_CAPSULE | Freq: Four times a day (QID) | ORAL | Status: DC | PRN
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
## 2011-11-16 NOTE — Progress Notes (Signed)
Recreation Therapy Group Note  Date: 11/16/2011         Time: 1415      Group Topic/Focus: The focus of this group is on discussing various styles of communication and communicating assertively using 'I' (feeling) statements.  Participation Level: Active  Participation Quality: Redirectable  Affect: Appropriate  Cognitive: Oriented  Additional Comments: None.  Tonja Jezewski 11/16/2011 5:43 PM

## 2011-11-16 NOTE — Progress Notes (Signed)
Patient ID: Melissa Bridges, female   DOB: 1961-09-22, 50 y.o.   MRN: [id number] Pt. Noted that she takes Seroquel 300 mg for sleep at night. Writer told pt. That recent documents showed that she had refused Seroquel evening dose pt. Denies. Writer called on call Serena Colonel, NP Received order for Seroquel 300 mg x l tonight.  Writer also told NP that pt. Had a Depakote lab value on 11/29 or <10. No new orders received.

## 2011-11-16 NOTE — Progress Notes (Signed)
[facility] Group Notes:  (Counselor/Nursing/MHT/Case Management/Adjunct)  11/16/2011 1:19 PM  Type of Therapy:  Group Therapy at 11:00am  Participation Level:  Minimal  Participation Quality:  Attentive yet quiet  Affect:  Depressed  Cognitive:  Oriented  Insight:  None shared  Engagement in Group:  Limited  Engagement in Therapy:  Limited  Modes of Intervention:  Clarification, Socialization and Support  Summary of Progress/Problems: Patients were asked to share their mental health diagnosis and a 2 week vacation location during inro to group. Session topic was feelings about diagnosis. Kennadi shared her diagnosis of cross addiction and desire to return to [place] where she lived for many years. Pt was quiet during group yet showed verbal que's of agreement when others spoke of dishonesty, fear of rejection & denial   Clide Dales 11/16/2011, 1:18 PM

## 2011-11-16 NOTE — Progress Notes (Signed)
Pt. Verbalized understanding of d/cinstructions. Pt. Denies SI,HI, & AVH.Pt. Given belongings from locker # 24. Pt. Given scripts & samples. Pt. D/c,d home.

## 2011-11-16 NOTE — Progress Notes (Signed)
Spoke with patient one to one in reference to history of self harm and SI> Discussion included warning signs, risk factors and resources to contact should patient ever experience sign of suicidal ideation again.  Pt agrees with Clinical research associate that her biggest risk factor will be using alcohol again. Pt returning to [place] at discharge and intends to agree to disagree with daughter on things our of her control. Pt shown mobile crisis management services card, and explanation of services provided along with card in pt's card for her at discharge.

## 2011-11-16 NOTE — Progress Notes (Signed)
Patient ID: Melissa Bridges, female   DOB: 1961/11/18, 50 y.o.   MRN: [id number]   Patient guarded on approach. Ventilating about not receiving the right medication since admitted but was happy to get the depakote this am. Denies depressed mood or SI this am. Wants discharge today. Doesn't feel that she needs to be here any longer. Staff will monitor and encourage group attendance.

## 2011-11-18 NOTE — Progress Notes (Signed)
Patient Discharge Instructions:  After Visit Summary Faxed,  11/18/2011 Faxed to the Next Level Care provider:  11/18/2011 Facesheet faxed 11/18/2011 SI Risk Assessment Admission faxed 11/18/2011  Faxed to [facility] @ [phone] And [facility] @ [phone]  Wandra Scot, 11/18/2011, 6:57 PM

## 2011-11-21 NOTE — Discharge Planning (Signed)
Patient ID: Melissa Bridges MRN: [id number] DOB/AGE: [birth year] 50 y.o.  Admit date: 11/11/2011 Discharge date: 11/18/2011  Discharge Diagnoses: Polysubstance Abuse                                          Schizoaffective disorder   HPI: Melissa Bridges was admitted to [facility] from ED where she presented with severe alcohol and cocaine intoxication. The patient was placed on a librium detox protocol and responded well. Home medications were continued as prescribed.   Hospital Course : She was seen and evaluated by the treatment team including the Psychiatrist, PA,Nurses,Social Worker, Sports coach, and Counselor. He/She was an active participant in the treatment and discharge planning. The treatment included medication management, health assessment, recovery programming through daily groups, counseling, AA, and discharge follow up planning. Health problems were identified and treated appropriately.          She was cooperative with staff and got along well with other patients. He/She followed the rules on the unit and was not a security risk.          She was active in the social milieu on a daily basis and interacted appropriately with other patients. The patient participated in and endorsed benefits from AA and groups offered on the unit.  [name] was motivated to receive the maximum benefits of unit programming for recovery.             Her health improved on a daily basis as the withdrawal symptoms decreased. The CIWA score was monitored daily and decreased appropriately. Her mood and attitude improved quickly as she received supportive care. The patient denied SI/HI within 24 hours of admission to [facility]. She remained in full contact with reality and did not display any symptoms of psychosis. The patient endorsed anxiety and concern over maintaining sobriety and worked with the team to develop coping skills to use upon discharge. The treatment team worked to help the patient identify potential stressors that could  trigger relapse. Contact was made with the family regarding Melissa Bridges' follow up care and arrangements were made for her to see a local physician for general health concerns.           Residential rehab was discussed with the patient  The patient  was also educated that continued alcohol use could cause serious health problems, and lead to an early death. She was encouraged to avoid all alcohol upon discharge. On the day of discharge all symptom of intoxication had resolved and the patient was discharged home in stable condition.   Labs:  Discharge Orders    Future Orders Please Complete By Expires   Diet - low sodium heart healthy         Discharge Medication List as of 11/18/2011  5:56 PM    CONTINUE these medications which have CHANGED   Details  citalopram (CELEXA) 10 MG tablet Take 1 tablet (10 mg total) by mouth daily., Starting 11/16/2011, Until Wed 11/15/12, Print    divalproex (DEPAKOTE ER) 500 MG 24 hr tablet Take 2 tablets (1,000 mg total) by mouth daily., Starting 11/16/2011, Until Wed 11/15/12, Print      CONTINUE these medications which have NOT CHANGED   Details  amLODipine (NORVASC) 10 MG tablet Take 10 mg by mouth daily.  , Until Discontinued, Historical Med    QUEtiapine (SEROQUEL) 300 MG tablet Take 300 mg by mouth 2 (  two) times daily.  , Until Discontinued, Historical Med      STOP taking these medications     albuterol (PROVENTIL,VENTOLIN) 90 MCG/ACT inhaler      diclofenac (FLECTOR) 1.3 % PTCH      meloxicam (MOBIC) 15 MG tablet      methocarbamol (ROBAXIN) 750 MG tablet      oxyCODONE-acetaminophen (PERCOCET) 5-325 MG per tablet         Follow-up Information    Follow up with [facility] on 11/23/2011. (Tuesday 12/11 at 2:30pm with Dr. Dicky Doe)    Contact information:   [address] [place] [address] phone [phone]      Follow up with [facility] at [facility] on 11/18/2011. Melissa Bridges 12/6 at 9:45 am with Dr. Nehemiah Settle)    Contact  information:   [address] [place], [place] [address]  Phone [phone]         Signed: Amaiya Scruton 11/21/2011, 7:59 PM

## 2011-12-22 DIAGNOSIS — R05 Cough: Secondary | ICD-10-CM | POA: Diagnosis not present

## 2011-12-22 DIAGNOSIS — I10 Essential (primary) hypertension: Secondary | ICD-10-CM | POA: Diagnosis not present

## 2011-12-22 DIAGNOSIS — J45909 Unspecified asthma, uncomplicated: Secondary | ICD-10-CM | POA: Diagnosis not present

## 2011-12-22 DIAGNOSIS — J209 Acute bronchitis, unspecified: Secondary | ICD-10-CM | POA: Diagnosis not present

## 2011-12-22 DIAGNOSIS — R059 Cough, unspecified: Secondary | ICD-10-CM | POA: Diagnosis not present

## 2012-01-06 DIAGNOSIS — M545 Low back pain, unspecified: Secondary | ICD-10-CM | POA: Diagnosis not present

## 2012-01-06 DIAGNOSIS — I10 Essential (primary) hypertension: Secondary | ICD-10-CM | POA: Diagnosis not present

## 2012-01-10 DIAGNOSIS — M5430 Sciatica, unspecified side: Secondary | ICD-10-CM | POA: Diagnosis not present

## 2012-01-17 NOTE — Discharge Summary (Signed)
PA Physician Assistant Signed Psychiatry Discharge Planning 11/21/2011 7:59 PM   Patient ID:  Melissa Bridges  MRN: [id number]  DOB/AGE: 53-Mar-1962 53 y.o.  Admit date: 11/11/2011  Discharge date: 11/18/2011  Discharge Diagnoses: Polysubstance Abuse  Schizoaffective disorder  HPI: Melissa Bridges was admitted to [facility] from ED where she presented with severe alcohol and cocaine intoxication. The patient was placed on a librium detox protocol and responded well. Home medications were continued as prescribed.  Hospital Course : She was seen and evaluated by the treatment team including the Psychiatrist, PA,Nurses,Social Worker, Sports coach, and Counselor. He/She was an active participant in the treatment and discharge planning. The treatment included medication management, health assessment, recovery programming through daily groups, counseling, AA, and discharge follow up planning. Health problems were identified and treated appropriately.  She was cooperative with staff and got along well with other patients. He/She followed the rules on the unit and was not a security risk.  She was active in the social milieu on a daily basis and interacted appropriately with other patients. The patient participated in and endorsed benefits from AA and groups offered on the unit. Melissa Bridges was motivated to receive the maximum benefits of unit programming for recovery.  Her health improved on a daily basis as the withdrawal symptoms decreased. The CIWA score was monitored daily and decreased appropriately. Her mood and attitude improved quickly as she received supportive care. The patient denied SI/HI within 24 hours of admission to [facility]. She remained in full contact with reality and did not display any symptoms of psychosis. The patient endorsed anxiety and concern over maintaining sobriety and worked with the team to develop coping skills to use upon discharge. The treatment team worked to help the patient identify potential stressors  that could trigger relapse. Contact was made with the family regarding [name]' follow up care and arrangements were made for her to see a local physician for general health concerns.  Residential rehab was discussed with the patient The patient was also educated that continued alcohol use could cause serious health problems, and lead to an early death. She was encouraged to avoid all alcohol upon discharge. On the day of discharge all symptom of intoxication had resolved and the patient was discharged home in stable condition.  Labs:    Discharge Orders     Future Orders  Please Complete By  Expires     Diet - low sodium heart healthy            Discharge Medication List as of 11/18/2011 5:56 PM       CONTINUE these medications which have CHANGED     Details    citalopram (CELEXA) 10 MG tablet  Take 1 tablet (10 mg total) by mouth daily., Starting 11/16/2011, Until Wed 11/15/12, Print       divalproex (DEPAKOTE ER) 500 MG 24 hr tablet  Take 2 tablets (1,000 mg total) by mouth daily., Starting 11/16/2011, Until Wed 11/15/12, Print          CONTINUE these medications which have NOT CHANGED     Details    amLODipine (NORVASC) 10 MG tablet  Take 10 mg by mouth daily. , Until Discontinued, Historical Med       QUEtiapine (SEROQUEL) 300 MG tablet  Take 300 mg by mouth 2 (two) times daily. , Until Discontinued, Historical Med          STOP taking these medications        albuterol (PROVENTIL,VENTOLIN) 90 MCG/ACT inhaler  diclofenac (FLECTOR) 1.3 % PTCH         meloxicam (MOBIC) 15 MG tablet         methocarbamol (ROBAXIN) 750 MG tablet         oxyCODONE-acetaminophen (PERCOCET) 5-325 MG per tablet              Follow-up Information     Follow up with [facility] on 11/23/2011. (Tuesday 12/11 at 2:30pm with Dr. Dicky Doe)     Contact information:     [address], [place] [address] phone [phone]         Follow up with [facility] at [facility] on 11/18/2011.  Charlie Pitter 12/6 at 9:45 am with Dr. Nehemiah Settle)     Contact information:     [address] [place], [place] [address]  Phone [phone]            Signed:  Chamia Schmutz  11/21/2011, 7:59 PM

## 2012-01-22 DIAGNOSIS — M545 Low back pain, unspecified: Secondary | ICD-10-CM | POA: Diagnosis not present

## 2012-01-22 DIAGNOSIS — I10 Essential (primary) hypertension: Secondary | ICD-10-CM | POA: Diagnosis not present

## 2012-02-10 DIAGNOSIS — I10 Essential (primary) hypertension: Secondary | ICD-10-CM | POA: Diagnosis not present

## 2012-02-10 DIAGNOSIS — R300 Dysuria: Secondary | ICD-10-CM | POA: Diagnosis not present

## 2012-03-29 DIAGNOSIS — F17200 Nicotine dependence, unspecified, uncomplicated: Secondary | ICD-10-CM | POA: Diagnosis not present

## 2012-03-29 DIAGNOSIS — M545 Low back pain, unspecified: Secondary | ICD-10-CM | POA: Diagnosis not present

## 2012-03-29 DIAGNOSIS — I10 Essential (primary) hypertension: Secondary | ICD-10-CM | POA: Diagnosis not present

## 2012-03-29 DIAGNOSIS — J45909 Unspecified asthma, uncomplicated: Secondary | ICD-10-CM | POA: Diagnosis not present

## 2012-04-28 DIAGNOSIS — M545 Low back pain, unspecified: Secondary | ICD-10-CM | POA: Diagnosis not present

## 2012-04-28 DIAGNOSIS — I10 Essential (primary) hypertension: Secondary | ICD-10-CM | POA: Diagnosis not present

## 2012-04-28 DIAGNOSIS — J45909 Unspecified asthma, uncomplicated: Secondary | ICD-10-CM | POA: Diagnosis not present

## 2012-06-19 DIAGNOSIS — G8929 Other chronic pain: Secondary | ICD-10-CM | POA: Diagnosis not present

## 2012-06-19 DIAGNOSIS — I10 Essential (primary) hypertension: Secondary | ICD-10-CM | POA: Diagnosis not present

## 2012-07-20 DIAGNOSIS — I10 Essential (primary) hypertension: Secondary | ICD-10-CM | POA: Diagnosis not present

## 2012-07-20 DIAGNOSIS — F17200 Nicotine dependence, unspecified, uncomplicated: Secondary | ICD-10-CM | POA: Diagnosis not present

## 2012-07-20 DIAGNOSIS — M545 Low back pain, unspecified: Secondary | ICD-10-CM | POA: Diagnosis not present

## 2012-07-20 DIAGNOSIS — E785 Hyperlipidemia, unspecified: Secondary | ICD-10-CM | POA: Diagnosis not present

## 2012-09-05 DIAGNOSIS — G43909 Migraine, unspecified, not intractable, without status migrainosus: Secondary | ICD-10-CM | POA: Diagnosis not present

## 2012-09-05 DIAGNOSIS — I10 Essential (primary) hypertension: Secondary | ICD-10-CM | POA: Diagnosis not present

## 2012-09-05 DIAGNOSIS — F17200 Nicotine dependence, unspecified, uncomplicated: Secondary | ICD-10-CM | POA: Diagnosis not present

## 2012-09-05 DIAGNOSIS — Z23 Encounter for immunization: Secondary | ICD-10-CM | POA: Diagnosis not present

## 2012-10-10 DIAGNOSIS — M545 Low back pain, unspecified: Secondary | ICD-10-CM | POA: Diagnosis not present

## 2012-10-10 DIAGNOSIS — R319 Hematuria, unspecified: Secondary | ICD-10-CM | POA: Diagnosis not present

## 2012-10-10 DIAGNOSIS — Z7251 High risk heterosexual behavior: Secondary | ICD-10-CM | POA: Diagnosis not present

## 2012-10-10 DIAGNOSIS — R109 Unspecified abdominal pain: Secondary | ICD-10-CM | POA: Diagnosis not present

## 2012-10-31 ENCOUNTER — Emergency Department (HOSPITAL_BASED_OUTPATIENT_CLINIC_OR_DEPARTMENT_OTHER)
Admission: EM | Admit: 2012-10-31 | Discharge: 2012-10-31 | Disposition: A | Discharge status: Home / Self Care | Payer: Medicare Other | Attending: Emergency Medicine | Admitting: Emergency Medicine

## 2012-10-31 ENCOUNTER — Encounter (HOSPITAL_BASED_OUTPATIENT_CLINIC_OR_DEPARTMENT_OTHER): Payer: Self-pay

## 2012-10-31 DIAGNOSIS — M545 Low back pain, unspecified: Secondary | ICD-10-CM | POA: Diagnosis not present

## 2012-10-31 DIAGNOSIS — M119 Crystal arthropathy, unspecified: Secondary | ICD-10-CM | POA: Diagnosis not present

## 2012-10-31 DIAGNOSIS — J45909 Unspecified asthma, uncomplicated: Secondary | ICD-10-CM | POA: Insufficient documentation

## 2012-10-31 DIAGNOSIS — F259 Schizoaffective disorder, unspecified: Secondary | ICD-10-CM | POA: Diagnosis not present

## 2012-10-31 DIAGNOSIS — F17200 Nicotine dependence, unspecified, uncomplicated: Secondary | ICD-10-CM | POA: Diagnosis not present

## 2012-10-31 DIAGNOSIS — G40909 Epilepsy, unspecified, not intractable, without status epilepticus: Secondary | ICD-10-CM | POA: Diagnosis not present

## 2012-10-31 DIAGNOSIS — Z79899 Other long term (current) drug therapy: Secondary | ICD-10-CM | POA: Diagnosis not present

## 2012-10-31 DIAGNOSIS — M5440 Lumbago with sciatica, unspecified side: Secondary | ICD-10-CM

## 2012-10-31 DIAGNOSIS — M5430 Sciatica, unspecified side: Secondary | ICD-10-CM | POA: Insufficient documentation

## 2012-10-31 DIAGNOSIS — G43909 Migraine, unspecified, not intractable, without status migrainosus: Secondary | ICD-10-CM | POA: Diagnosis not present

## 2012-10-31 HISTORY — DX: Migraine, unspecified, not intractable, without status migrainosus: G43.909

## 2012-10-31 MED ORDER — PREDNISONE 10 MG PO TABS: ORAL_TABLET | ORAL | Status: DC

## 2012-10-31 MED ORDER — OXYCODONE-ACETAMINOPHEN 5-325 MG PO TABS: 2.0000 | ORAL_TABLET | ORAL | Status: DC | PRN

## 2012-10-31 MED ORDER — OXYCODONE-ACETAMINOPHEN 5-325 MG PO TABS
2.0000 | ORAL_TABLET | Freq: Once | ORAL | Status: AC
  Administered 2012-10-31: 2 via ORAL
  Filled 2012-10-31 (×2): qty 2

## 2012-10-31 NOTE — ED Provider Notes (Signed)
History     CSN: [id number]  Arrival date & time 10/31/12  1925   First MD Initiated Contact with Patient 10/31/12 2008      Chief Complaint  Patient presents with  . Back Pain    (Consider location/radiation/quality/duration/timing/severity/associated sxs/prior treatment) Patient is a 51 y.o. female presenting with back pain. The history is provided by the patient. No language interpreter was used.  Back Pain  This is a new problem. The current episode started more than 1 week ago. The problem occurs constantly. The problem has been gradually worsening. The pain is associated with lifting heavy objects. The pain is present in the lumbar spine. The quality of the pain is described as shooting. The pain radiates to the left thigh. The pain is at a severity of 6/10. The pain is moderate. The symptoms are aggravated by bending and twisting. The pain is the same all the time. Stiffness is present all day. Pertinent negatives include no abdominal pain. She has tried nothing for the symptoms. The treatment provided no relief. Risk factors: hx of sciatica.    Past Medical History  Diagnosis Date  . Sciatic pain   . Seizures   . Schizoaffective disorder   . Hepatitis C   . Arthritis   . Asthma   . Migraine     Past Surgical History  Procedure Date  . Abdominal hysterectomy   . Dilation and curettage of uterus   . Exploratory laparotomy   . Ovarian cyst removal 2008    Family History  Problem Relation Age of Onset  . Diabetes type II Mother   . Diabetes type II Maternal Aunt   . Cancer Maternal Uncle     History  Substance Use Topics  . Smoking status: Current Every Day Smoker -- 1.0 packs/day  . Smokeless tobacco: Not on file  . Alcohol Use: No    OB History    Grav Para Term Preterm Abortions TAB SAB Ect Mult Living                  Review of Systems  Gastrointestinal: Negative for abdominal pain.  Musculoskeletal: Positive for back pain.  All other systems  reviewed and are negative.    Allergies  Ibuprofen and Aspirin  Home Medications   Current Outpatient Rx  Name  Route  Sig  Dispense  Refill  . ROBAXIN PO   Oral   Take by mouth.         . AMLODIPINE BESYLATE 10 MG PO TABS   Oral   Take 10 mg by mouth daily.           [name] CITALOPRAM HYDROBROMIDE 10 MG PO TABS   Oral   Take 1 tablet (10 mg total) by mouth daily.   30 tablet   0   . DIVALPROEX SODIUM ER 500 MG PO TB24   Oral   Take 2 tablets (1,000 mg total) by mouth daily.   30 tablet   0   . QUETIAPINE FUMARATE 300 MG PO TABS   Oral   Take 300 mg by mouth 2 (two) times daily.             BP 127/84  Pulse 72  Temp 98.1 F (36.7 C) (Oral)  Resp 16  Ht 5\' 3"  (1.6 m)  Wt 127 lb (57.607 kg)  BMI 22.50 kg/m2  SpO2 100%  Physical Exam  Nursing note and vitals reviewed. Constitutional: She is oriented to person, place, and time.  She appears well-developed.  HENT:  Head: Normocephalic and atraumatic.  Right Ear: External ear normal.  Left Ear: External ear normal.  Nose: Nose normal.  Mouth/Throat: Oropharynx is clear and moist.  Eyes: Pupils are equal, round, and reactive to light.  Neck: Normal range of motion.  Cardiovascular: Normal rate and normal heart sounds.   Pulmonary/Chest: Effort normal and breath sounds normal.  Abdominal: Soft. Bowel sounds are normal.  Musculoskeletal: Normal range of motion.       Tender lumbar spine,  Tender sciatic area  Neurological: She is alert and oriented to person, place, and time.  Skin: Skin is warm.  Psychiatric: She has a normal mood and affect.    ED Course  Procedures (including critical care time)  Labs Reviewed - No data to display No results found.   No diagnosis found.    MDM  Pt advised follow up with her MD for recheck.  Pt given rx for prednisone and percocet for pain        Lonia Skinner [place], [place] 10/31/12 2033

## 2012-10-31 NOTE — ED Notes (Addendum)
C/o lower back pain since 11/15-denies injury-reports hx of sciatica-pt states she is out of percocet

## 2012-10-31 NOTE — ED Provider Notes (Signed)
Medical screening examination/treatment/procedure(s) were performed by non-physician practitioner and as supervising physician I was immediately available for consultation/collaboration.    Nelia Shi, MD 10/31/12 2035

## 2012-11-06 ENCOUNTER — Ambulatory Visit (INDEPENDENT_AMBULATORY_CARE_PROVIDER_SITE_OTHER): Payer: Medicare Other | Admitting: Family Medicine

## 2012-11-06 VITALS — BP 154/79 | HR 73 | Temp 97.8°F | Resp 16 | Ht 64.0 in | Wt 129.0 lb

## 2012-11-06 DIAGNOSIS — G40909 Epilepsy, unspecified, not intractable, without status epilepticus: Secondary | ICD-10-CM | POA: Diagnosis not present

## 2012-11-06 DIAGNOSIS — F259 Schizoaffective disorder, unspecified: Secondary | ICD-10-CM | POA: Diagnosis not present

## 2012-11-06 DIAGNOSIS — M549 Dorsalgia, unspecified: Secondary | ICD-10-CM

## 2012-11-06 MED ORDER — CITALOPRAM HYDROBROMIDE 10 MG PO TABS: 10.0000 mg | ORAL_TABLET | Freq: Every day | ORAL | Status: DC

## 2012-11-06 MED ORDER — QUETIAPINE FUMARATE 300 MG PO TABS: 300.0000 mg | ORAL_TABLET | Freq: Two times a day (BID) | ORAL | Status: DC

## 2012-11-06 MED ORDER — OXYCODONE-ACETAMINOPHEN 5-325 MG PO TABS: 2.0000 | ORAL_TABLET | Freq: Three times a day (TID) | ORAL | Status: DC | PRN

## 2012-11-06 NOTE — Progress Notes (Signed)
Patient ID: Melissa Bridges, female   DOB: Apr 23, 1961, 51 y.o.   MRN: [id number] Melissa Bridges is a 51 y.o. female who presents to Urgent Care today for low back pain.  She recently moved here about 2 weeks ago from [place].  She has not yet established care in [place]:    1.  Low back pain:  Present for about 1 week.  Presented to ED due to pain.  Was prescribed Prednisone and Percocet.  Could not afford the Prednisone because she doesn't get paid until Dec 6.  Describes sharp, stabbing pain that is relieved with Percocet.  Radiates down Left leg.  Prior history of back pain as well.  Has not had flare for several months, but was helping her family move when pain flared up.  No fevers or chills, no bladder/bowel incontinence, no saddle anesthesia.    2.  Seizure disorder:  Controlled with Depakote.  Had several refills provided before she moved from [place].  Cannot remember last time she had a seizure.  She requests referral for new neurologist since she has relocated.  Of note, she also has history of schizoaffective disorder.     PMH reviewed.  ROS as above otherwise neg.  No chest pain, palpitations, SOB, Fever, Chills, Abd pain, N/V/D.  Medications reviewed. Current Outpatient Prescriptions  Medication Sig Dispense Refill  . albuterol (PROVENTIL HFA;VENTOLIN HFA) 108 (90 BASE) MCG/ACT inhaler Inhale 2 puffs into the lungs every 6 (six) hours as needed.      [name] amLODipine (NORVASC) 10 MG tablet Take 10 mg by mouth daily.        . citalopram (CELEXA) 10 MG tablet Take 1 tablet (10 mg total) by mouth daily.  30 tablet  0  . divalproex (DEPAKOTE ER) 500 MG 24 hr tablet Take 2 tablets (1,000 mg total) by mouth daily.  30 tablet  0  . Methocarbamol (ROBAXIN PO) Take by mouth.      . QUEtiapine (SEROQUEL) 300 MG tablet Take 300 mg by mouth 2 (two) times daily.        [name] oxyCODONE-acetaminophen (PERCOCET/ROXICET) 5-325 MG per tablet Take 2 tablets by mouth every 4 (four) hours as needed  for pain.  6 tablet  0  . predniSONE (DELTASONE) 10 MG tablet 6,5,4,3,2,1 taper  21 tablet  0    Exam:  BP 154/79  Pulse 73  Temp 97.8 F (36.6 C) (Oral)  Resp 16  Ht 5\' 4"  (1.626 m)  Wt 129 lb (58.514 kg)  BMI 22.14 kg/m2  SpO2 100% Gen: Well NAD HEENT: EOMI,  MMM Lungs: CTABL Nl WOB Heart: RRR no MRG Abd: NABS, NT, ND Back:  Normal skin, Spine with normal alignment and no deformity.  No tenderness to vertebral process palpation.  Paraspinous muscles are tender with spasm bilateral lumbar region.   Range of motion is full at neck and lumbar sacral regions.  Straight leg raise is positive for back pain bilaterally  Neuro:  Sensation and motor function 5/5 bilateral lower extremities.  Patellar and Achilles  DTR's +2 patellar BL Psych:  Pleasant, conversant, not depressed, manic, or anxious appearing.  Denies current hallucinations.      Assessment and Plan:  1.  Back pain:  No red flags today.  Provided patient with week's worth of Oxycodone.  Recommended to start OTC ibuprofen as anti-inflammatory.  Heat and massage.  Return for recheck in 10 days or sooner if no improvement.   2.  Seizure disorder:  Controlled with Depakote.  Requests neurology referral, will place today.   3.  Schizoaffective disorder:  Requests Psychiatry referral, will place today.

## 2012-11-06 NOTE — Patient Instructions (Addendum)
It was good to meet you.    Heating pads and massage are good for your back.   Take the Percocet for pain relief.   I refilled your other medicines.    I have put in a Neurology and Psychiatry referral as well.

## 2012-11-08 NOTE — Progress Notes (Signed)
I have read, reviewed, and agree with plan of care by Dr. Walden  

## 2012-12-12 ENCOUNTER — Ambulatory Visit (INDEPENDENT_AMBULATORY_CARE_PROVIDER_SITE_OTHER): Payer: Medicare Other | Admitting: Emergency Medicine

## 2012-12-12 ENCOUNTER — Other Ambulatory Visit: Payer: Self-pay | Admitting: Emergency Medicine

## 2012-12-12 ENCOUNTER — Ambulatory Visit: Payer: Medicare Other

## 2012-12-12 VITALS — BP 157/99 | HR 85 | Temp 99.0°F | Resp 16 | Ht 64.0 in | Wt 126.0 lb

## 2012-12-12 DIAGNOSIS — I10 Essential (primary) hypertension: Secondary | ICD-10-CM

## 2012-12-12 DIAGNOSIS — R05 Cough: Secondary | ICD-10-CM

## 2012-12-12 DIAGNOSIS — B1920 Unspecified viral hepatitis C without hepatic coma: Secondary | ICD-10-CM

## 2012-12-12 DIAGNOSIS — R059 Cough, unspecified: Secondary | ICD-10-CM

## 2012-12-12 DIAGNOSIS — F250 Schizoaffective disorder, bipolar type: Secondary | ICD-10-CM | POA: Insufficient documentation

## 2012-12-12 DIAGNOSIS — F259 Schizoaffective disorder, unspecified: Secondary | ICD-10-CM

## 2012-12-12 DIAGNOSIS — R569 Unspecified convulsions: Secondary | ICD-10-CM | POA: Insufficient documentation

## 2012-12-12 DIAGNOSIS — Z206 Contact with and (suspected) exposure to human immunodeficiency virus [HIV]: Secondary | ICD-10-CM

## 2012-12-12 DIAGNOSIS — F17200 Nicotine dependence, unspecified, uncomplicated: Secondary | ICD-10-CM

## 2012-12-12 DIAGNOSIS — J45909 Unspecified asthma, uncomplicated: Secondary | ICD-10-CM | POA: Diagnosis not present

## 2012-12-12 DIAGNOSIS — Z20828 Contact with and (suspected) exposure to other viral communicable diseases: Secondary | ICD-10-CM | POA: Diagnosis not present

## 2012-12-12 LAB — COMPREHENSIVE METABOLIC PANEL
Albumin: 4.7 g/dL (ref 3.5–5.2)
Alkaline Phosphatase: 102 U/L (ref 39–117)
BUN: 11 mg/dL (ref 6–23)
Creat: 1.09 mg/dL (ref 0.50–1.10)
Glucose, Bld: 87 mg/dL (ref 70–99)
Total Bilirubin: 0.5 mg/dL (ref 0.3–1.2)

## 2012-12-12 LAB — POCT CBC
HCT, POC: 39.3 % (ref 37.7–47.9)
Lymph, poc: 2.3 (ref 0.6–3.4)
MCH, POC: 28.6 pg (ref 27–31.2)
MCHC: 30 g/dL — AB (ref 31.8–35.4)
MCV: 95.3 fL (ref 80–97)
MID (cbc): 0.5 (ref 0–0.9)
POC LYMPH PERCENT: 34.4 %L (ref 10–50)
RDW, POC: 16.4 %

## 2012-12-12 LAB — HIV ANTIBODY (ROUTINE TESTING W REFLEX): HIV: NONREACTIVE

## 2012-12-12 MED ORDER — ALBUTEROL SULFATE (2.5 MG/3ML) 0.083% IN NEBU: 2.5000 mg | INHALATION_SOLUTION | Freq: Once | RESPIRATORY_TRACT | Status: DC

## 2012-12-12 MED ORDER — CITALOPRAM HYDROBROMIDE 10 MG PO TABS: 10.0000 mg | ORAL_TABLET | Freq: Every day | ORAL | Status: DC

## 2012-12-12 MED ORDER — AMLODIPINE BESYLATE 10 MG PO TABS: 10.0000 mg | ORAL_TABLET | Freq: Every day | ORAL | Status: DC

## 2012-12-12 MED ORDER — ALBUTEROL SULFATE HFA 108 (90 BASE) MCG/ACT IN AERS: 2.0000 | INHALATION_SPRAY | RESPIRATORY_TRACT | Status: DC | PRN

## 2012-12-12 MED ORDER — PREDNISONE 20 MG PO TABS: ORAL_TABLET | ORAL | Status: DC

## 2012-12-12 MED ORDER — DIVALPROEX SODIUM ER 500 MG PO TB24: 1000.0000 mg | ORAL_TABLET | Freq: Every day | ORAL | Status: DC

## 2012-12-12 MED ORDER — QUETIAPINE FUMARATE 300 MG PO TABS: 300.0000 mg | ORAL_TABLET | Freq: Two times a day (BID) | ORAL | Status: DC

## 2012-12-12 NOTE — Progress Notes (Signed)
  Subjective:    Patient ID: Melissa Bridges, female    DOB: Apr 25, 1961, 51 y.o.   MRN: [id number]  HPI problem #1 asthma. Patient continues to smoke at least a pack per day. She's had recent low-grade temp. She's been using her inhaler about every 4 hours. She continues to have cough she has not been on any preventative medications in the past. Problem #2 schizoaffective disorder. She's currently on Seroquel for this problem. She has been stable and hypomanic at times. Problem #3 is a seizure disorder. She's currently on Depakote for this problem and has not had any recurrent seizures today. Problem #4 hypertension she denies any chest pain or shortness of breath but does need a refill on her medications.    Review of Systems     Objective:   Physical Exam  Constitutional: She appears well-developed. No distress.       Patient is not agitated but walks around and rhythm gets on and off the table and changes the exam table paper.  Eyes: Pupils are equal, round, and reactive to light.  Neck: Normal range of motion. No thyromegaly present.  Cardiovascular: Normal rate, regular rhythm and normal heart sounds.   Pulmonary/Chest:       Breath sounds are symmetrical. There are bilateral expiratory wheezes noted. There no areas of dullness .  Abdominal: She exhibits no distension. There is no tenderness.  Musculoskeletal:       She is tenderness over the L5-S1 area no focal neurological signs.  Neurological:       Patient has no focal neurological signs. As stated above she is very active in the room  Skin: She is not diaphoretic.   Posttreatment peak flow was 250 after one albuterol treatment.  [facility] reading (PRIMARY) by  Dr. Cleta Alberts Chest x-ray shows evidence of air trapping but no pneumothorax is seen no infiltrate is seen .       Assessment & Plan:   I refilled her albuterol amlodipine Celexa and Depakote and Seroquel. I felt she needed to be on prednisone in order to breathe. I put a  tapered dose of prednisone 60 mg a day for 3 days 40 mg a day for 3 days and 20 mg a day for 3 days. Advised her to come in and see me in a couple of weeks and I will try and arrange for her to have followup with a psychiatrist and a neurologist and see if we get her established with a PCP.

## 2012-12-12 NOTE — Progress Notes (Deleted)
  Subjective:    Patient ID: Melissa Bridges, female    DOB: [birth year], 51 y.o.   MRN: [id number]  HPI    Review of Systems     Objective:   Physical Exam        Assessment & Plan:

## 2012-12-17 ENCOUNTER — Telehealth: Payer: Self-pay

## 2012-12-17 NOTE — Telephone Encounter (Signed)
PT RETURNING OUR CALL ABOUT LAB RESULS  BEST NUMBER [phone]

## 2012-12-18 NOTE — Telephone Encounter (Signed)
Already spoken to yesterday

## 2012-12-19 NOTE — Addendum Note (Signed)
Addended by: Johnnette Litter on: 12/19/2012 02:03 PM   Modules accepted: Orders

## 2012-12-28 ENCOUNTER — Ambulatory Visit: Payer: Medicare Other

## 2012-12-28 ENCOUNTER — Ambulatory Visit (INDEPENDENT_AMBULATORY_CARE_PROVIDER_SITE_OTHER): Payer: Medicare Other | Admitting: Family Medicine

## 2012-12-28 VITALS — BP 116/74 | HR 72 | Temp 98.1°F | Resp 16 | Ht 64.0 in | Wt 125.0 lb

## 2012-12-28 DIAGNOSIS — G43509 Persistent migraine aura without cerebral infarction, not intractable, without status migrainosus: Secondary | ICD-10-CM

## 2012-12-28 DIAGNOSIS — M549 Dorsalgia, unspecified: Secondary | ICD-10-CM

## 2012-12-28 DIAGNOSIS — B1920 Unspecified viral hepatitis C without hepatic coma: Secondary | ICD-10-CM | POA: Diagnosis not present

## 2012-12-28 DIAGNOSIS — G40909 Epilepsy, unspecified, not intractable, without status epilepticus: Secondary | ICD-10-CM

## 2012-12-28 DIAGNOSIS — F259 Schizoaffective disorder, unspecified: Secondary | ICD-10-CM

## 2012-12-28 DIAGNOSIS — G43909 Migraine, unspecified, not intractable, without status migrainosus: Secondary | ICD-10-CM

## 2012-12-28 LAB — POCT CBC
Hemoglobin: 12.2 g/dL (ref 12.2–16.2)
MPV: 8.9 fL (ref 0–99.8)
POC Granulocyte: 6 (ref 2–6.9)
POC MID %: 7 %M (ref 0–12)
RBC: 4.2 M/uL (ref 4.04–5.48)

## 2012-12-28 LAB — COMPREHENSIVE METABOLIC PANEL
ALT: 81 U/L — ABNORMAL HIGH (ref 0–35)
CO2: 25 mEq/L (ref 19–32)
Calcium: 9.7 mg/dL (ref 8.4–10.5)
Chloride: 101 mEq/L (ref 96–112)
Sodium: 136 mEq/L (ref 135–145)
Total Bilirubin: 0.4 mg/dL (ref 0.3–1.2)
Total Protein: 7.7 g/dL (ref 6.0–8.3)

## 2012-12-28 MED ORDER — MELOXICAM 15 MG PO TABS: 15.0000 mg | ORAL_TABLET | Freq: Every day | ORAL | Status: DC

## 2012-12-28 MED ORDER — HYDROCODONE-ACETAMINOPHEN 5-500 MG PO TABS: 1.0000 | ORAL_TABLET | Freq: Four times a day (QID) | ORAL | Status: DC | PRN

## 2012-12-28 NOTE — Progress Notes (Signed)
[address]   [place], [place]  [address]   [phone]  Subjective:    Patient ID: Melissa Bridges, female    DOB: [birth year], 52 y.o.   MRN: [id number]  HPIThis 52 y.o. female presents for evaluation of low back pain.  Onset of low back pain six days ago.  No triggering event; no injury.  A rainy or cold days causes worsening pain.  R sided pain.  Radiation into B legs but worse into R leg. Radiates into R calf.  No n/t; +burning sensation.  +R leg weakness.  Normal b/b function.  No saddle paresthesias.  Taking Percocet and Vicodin; taking narcotic every four hours or as needed; no Percocet currently.  Prescribed Prednisone at last visit for asthma exacerbation which seemed to help back pain.  Taking a lot of Tylenol.  Severity 9/10.  2.  HA: onset two days ago; B frontal region and radiates into upper back; +nausea with vomiting x 1; history of migraines but has not had one in two years.  Typical migraine.  +photophobia and phonophobia.  Previously took Fioricet for migraines.  +blurred vision with headache.  No diplopia.  +dizziness which is typical feature of migraines.  No confusion or fever.  Severity 7/10.    3.  Health Maintenance: last physical one year ago in [place].  4.  Hepatitis C: diagnosed in college.  Iv drug use in college.  No previous treatment for Hepatitis C.  Awaiting referral to hepatitis clinic.    5.  Seizures:  Awaiting referral to neurology; last seizure three months ago; unable to drive for one year after last seizure.  Daughter made pt take the bus to office.  PCP:  [facility] Neurologist:  None yet; waiting on referral. Psychiatrist: pending; waiting on referral.   Review of Systems  Constitutional: Negative for fever, chills, diaphoresis and fatigue.  Eyes: Positive for photophobia and visual disturbance.  Gastrointestinal: Positive for nausea and vomiting. Negative for abdominal pain and diarrhea.  Musculoskeletal: Positive for myalgias, back pain and gait  problem. Negative for joint swelling.  Neurological: Positive for dizziness, seizures, weakness and headaches. Negative for tremors, syncope, facial asymmetry, speech difficulty, light-headedness and numbness.    Past Medical History  Diagnosis Date  . Sciatic pain   . Seizures   . Schizoaffective disorder   . Hepatitis C   . Arthritis   . Asthma   . Migraine   . Allergy   . Depression   . Anxiety   . Hypertension   . Alopecia     Past Surgical History  Procedure Date  . Abdominal hysterectomy   . Dilation and curettage of uterus   . Exploratory laparotomy   . Ovarian cyst removal 2008    Prior to Admission medications   Medication Sig Start Date End Date Taking? Authorizing Provider  albuterol (PROVENTIL HFA;VENTOLIN HFA) 108 (90 BASE) MCG/ACT inhaler Inhale 2 puffs into the lungs every 4 (four) hours as needed. 12/12/12  Yes Collene Gobble, MD  amLODipine (NORVASC) 10 MG tablet Take 1 tablet (10 mg total) by mouth daily. 12/12/12  Yes Collene Gobble, MD  citalopram (CELEXA) 10 MG tablet Take 1 tablet (10 mg total) by mouth daily. 12/12/12 12/12/13 Yes Collene Gobble, MD  divalproex (DEPAKOTE ER) 500 MG 24 hr tablet Take 2 tablets (1,000 mg total) by mouth daily. 12/12/12 12/12/13 Yes Collene Gobble, MD  Methocarbamol (ROBAXIN PO) Take by mouth.   Yes Historical Provider, MD  QUEtiapine (  SEROQUEL) 300 MG tablet Take 1 tablet (300 mg total) by mouth 2 (two) times daily. 12/12/12  Yes Collene Gobble, MD  oxyCODONE-acetaminophen (PERCOCET/ROXICET) 5-325 MG per tablet Take 2 tablets by mouth every 8 (eight) hours as needed for pain. 11/06/12   Tobey Grim, MD  predniSONE (DELTASONE) 20 MG tablet 3 a day for 3 days 2 a day for 3 days 1 a day for 3 days 12/12/12   Collene Gobble, MD    Allergies  Allergen Reactions  . Ibuprofen Other (See Comments)    Stomach upset  . Aspirin Rash and Other (See Comments)    Stomach upset    History   Social History  . Marital Status:  Widowed    Spouse Name: N/A    Number of Children: N/A  . Years of Education: N/A   Occupational History  . Not on file.   Social History Main Topics  . Smoking status: Current Every Day Smoker -- 1.0 packs/day  . Smokeless tobacco: Not on file  . Alcohol Use: No  . Drug Use: No  . Sexually Active: Not on file     Comment: widow   Other Topics Concern  . Not on file   Social History Narrative   Marital status:  Widowed since 2002.  Married x 16 years.  Not dating.  Moved from [employer] to live with daughter in 2013.  Children:  One child; two grandchildren   Lives: with daughter, grandchildren.   Employment:  Disability for schizoaffective disorder.   Tobacco: 1 ppd   Alcohol:  None    Drugs: none   Exercise: none    Family History  Problem Relation Age of Onset  . Diabetes type II Mother   . Hypertension Mother   . Arthritis Mother   . Diabetes type II Maternal Aunt   . Cancer Maternal Uncle   . Hypertension Father   . Heart murmur Father   . Alopecia Sister   . Alopecia Brother   . Alopecia Sister        Objective:   Physical Exam  Nursing note and vitals reviewed. Constitutional: She is oriented to person, place, and time. She appears well-developed and well-nourished. No distress.  HENT:  Head: Normocephalic and atraumatic.  Right Ear: External ear normal.  Left Ear: External ear normal.  Nose: Nose normal.  Mouth/Throat: Oropharynx is clear and moist.  Eyes: Conjunctivae normal and EOM are normal. Pupils are equal, round, and reactive to light.  Neck: Normal range of motion. Neck supple. No JVD present. No thyromegaly present.  Cardiovascular: Normal rate and regular rhythm.  Exam reveals no gallop and no friction rub.   No murmur heard. Pulmonary/Chest: Effort normal and breath sounds normal. No respiratory distress. She has no wheezes. She has no rales.  Abdominal: Soft. Bowel sounds are normal. She exhibits no distension and no mass. There is no  tenderness. There is no rebound and no guarding.  Musculoskeletal:       Lumbar back: She exhibits tenderness, pain and spasm. She exhibits normal range of motion and no bony tenderness.       LUMBAR SPINE: +TTP R PARASPINAL REGION AND SI JOINT REGION; STRAIGHT LEG RAISES EQUIVOCAL.  MOTOR 5/5.  TOE AND HEEL WALKING INTACT; MARCHING INTACT.  Lymphadenopathy:    She has no cervical adenopathy.  Neurological: She is alert and oriented to person, place, and time. She has normal strength and normal reflexes. She displays no atrophy and no  tremor. No cranial nerve deficit or sensory deficit. She exhibits normal muscle tone. Coordination normal.  Skin: She is not diaphoretic.  Psychiatric: She has a normal mood and affect. Her behavior is normal. Judgment and thought content normal.       ANXIOUS.   Results for orders placed in visit on 12/28/12  POCT CBC      Component Value Range   WBC 10.9 (*) 4.6 - 10.2 K/uL   Lymph, poc 4.1 (*) 0.6 - 3.4   POC LYMPH PERCENT 37.9  10 - 50 %L   MID (cbc) 0.8  0 - 0.9   POC MID % 7.0  0 - 12 %M   POC Granulocyte 6.0  2 - 6.9   Granulocyte percent 55.1  37 - 80 %G   RBC 4.20  4.04 - 5.48 M/uL   Hemoglobin 12.2  12.2 - 16.2 g/dL   HCT, POC 40.9  81.1 - 47.9 %   MCV 94.2  80 - 97 fL   MCH, POC 29.0  27 - 31.2 pg   MCHC 30.7 (*) 31.8 - 35.4 g/dL   RDW, POC 91.4     Platelet Count, POC 346  142 - 424 K/uL   MPV 8.9  0 - 99.8 fL    [facility] reading (PRIMARY) by  Dr. Katrinka Blazing.  LS SPINE FILMS:  SPURRING AT VARIOUS LEVELS; DDD; NO ACUTE FINDINGS.      Assessment & Plan:   1. Back pain  DG Lumbar Spine Complete, POCT CBC, POCT UA - Microscopic Only, POCT urinalysis dipstick, Comprehensive metabolic panel  2. Migraine    3. Hepatitis C    4. Seizure disorder    5. Schizoaffective disorder       1.  Low back pain:  Recurrent/worsening; rx for  Meloxicam 15mg  daily provided; previous Prednisone worsened psychiatric symptoms; rx for Hydrocodone also provided  for PRN use.  Home exercise program provided to perform daily.  Avoid lifting> 10 pounds for next two weeks; also avoid repetitive bending, twisting, rotating.   Has left over Robaxin at home; advised to take scheduled for next week.   2.  Migraine Headache: recurrent.  Rx for hydrocodone provided to use for back pain and migraine. 3.  Hepatitis C:  Stable; will place referral for Hepatitis clinic. 4.  Seizure disorder: stable; refer for neurology. 5.  Schizoaffective disorder: stable; refer to local psychiatrist; pt recently relocated to [facility] to live with daughter for improved social support.   Meds ordered this encounter  Medications  . meloxicam (MOBIC) 15 MG tablet    Sig: Take 1 tablet (15 mg total) by mouth daily.    Dispense:  30 tablet    Refill:  0  . HYDROcodone-acetaminophen (VICODIN) 5-500 MG per tablet    Sig: Take 1 tablet by mouth every 6 (six) hours as needed for pain.    Dispense:  30 tablet    Refill:  0

## 2012-12-28 NOTE — Patient Instructions (Addendum)
1. Back pain  DG Lumbar Spine Complete, POCT CBC, POCT UA - Microscopic Only, POCT urinalysis dipstick, Comprehensive metabolic panel  2. Migraine    3. Hepatitis C    4. Seizure disorder    5. Schizoaffective disorder     Sciatica with Rehab The sciatic nerve runs from the back down the leg and is responsible for sensation and control of the muscles in the back (posterior) side of the thigh, lower leg, and foot. Sciatica is a condition that is characterized by inflammation of this nerve.  SYMPTOMS   Signs of nerve damage, including numbness and/or weakness along the posterior side of the lower extremity.  Pain in the back of the thigh that may also travel down the leg.  Pain that worsens when sitting for long periods of time.  Occasionally, pain in the back or buttock. CAUSES  Inflammation of the sciatic nerve is the cause of sciatica. The inflammation is due to something irritating the nerve. Common sources of irritation include:  Sitting for long periods of time.  Direct trauma to the nerve.  Arthritis of the spine.  Herniated or ruptured disk.  Slipping of the vertebrae (spondylolithesis)  Pressure from soft tissues, such as muscles or ligament-like tissue (fascia). RISK INCREASES WITH:  Sports that place pressure or stress on the spine (football or weightlifting).  Poor strength and flexibility.  Failure to warm-up properly before activity.  Family history of low back pain or disk disorders.  Previous back injury or surgery.  Poor body mechanics, especially when lifting, or poor posture. PREVENTION   Warm up and stretch properly before activity.  Maintain physical fitness:  Strength, flexibility, and endurance.  Cardiovascular fitness.  Learn and use proper technique, especially with posture and lifting. When possible, have coach correct improper technique.  Avoid activities that place stress on the spine. PROGNOSIS If treated properly, then sciatica  usually resolves within 6 weeks. However, occasionally surgery is necessary.  RELATED COMPLICATIONS   Permanent nerve damage, including pain, numbness, tingle, or weakness.  Chronic back pain.  Risks of surgery: infection, bleeding, nerve damage, or damage to surrounding tissues. TREATMENT Treatment initially involves resting from any activities that aggravate your symptoms. The use of ice and medication may help reduce pain and inflammation. The use of strengthening and stretching exercises may help reduce pain with activity. These exercises may be performed at home or with referral to a therapist. A therapist may recommend further treatments, such as transcutaneous electronic nerve stimulation (TENS) or ultrasound. Your caregiver may recommend corticosteroid injections to help reduce inflammation of the sciatic nerve. If symptoms persist despite non-surgical (conservative) treatment, then surgery may be recommended. MEDICATION  If pain medication is necessary, then nonsteroidal anti-inflammatory medications, such as aspirin and ibuprofen, or other minor pain relievers, such as acetaminophen, are often recommended.  Do not take pain medication for 7 days before surgery.  Prescription pain relievers may be given if deemed necessary by your caregiver. Use only as directed and only as much as you need.  Ointments applied to the skin may be helpful.  Corticosteroid injections may be given by your caregiver. These injections should be reserved for the most serious cases, because they may only be given a certain number of times. HEAT AND COLD  Cold treatment (icing) relieves pain and reduces inflammation. Cold treatment should be applied for 10 to 15 minutes every 2 to 3 hours for inflammation and pain and immediately after any activity that aggravates your symptoms. Use ice packs  or massage the area with a piece of ice (ice massage).  Heat treatment may be used prior to performing the stretching  and strengthening activities prescribed by your caregiver, physical therapist, or athletic trainer. Use a heat pack or soak the injury in warm water. SEEK MEDICAL CARE IF:  Treatment seems to offer no benefit, or the condition worsens.  Any medications produce adverse side effects. EXERCISES  RANGE OF MOTION (ROM) AND STRETCHING EXERCISES - Sciatica Most people with sciatic will find that their symptoms worsen with either excessive bending forward (flexion) or arching at the low back (extension). The exercises which will help resolve your symptoms will focus on the opposite motion. Your physician, physical therapist or athletic trainer will help you determine which exercises will be most helpful to resolve your low back pain. Do not complete any exercises without first consulting with your clinician. Discontinue any exercises which worsen your symptoms until you speak to your clinician. If you have pain, numbness or tingling which travels down into your buttocks, leg or foot, the goal of the therapy is for these symptoms to move closer to your back and eventually resolve. Occasionally, these leg symptoms will get better, but your low back pain may worsen; this is typically an indication of progress in your rehabilitation. Be certain to be very alert to any changes in your symptoms and the activities in which you participated in the 24 hours prior to the change. Sharing this information with your clinician will allow him/her to most efficiently treat your condition. These exercises may help you when beginning to rehabilitate your injury. Your symptoms may resolve with or without further involvement from your physician, physical therapist or athletic trainer. While completing these exercises, remember:   Restoring tissue flexibility helps normal motion to return to the joints. This allows healthier, less painful movement and activity.  An effective stretch should be held for at least 30 seconds.  A  stretch should never be painful. You should only feel a gentle lengthening or release in the stretched tissue. FLEXION RANGE OF MOTION AND STRETCHING EXERCISES: STRETCH  Flexion, Single Knee to Chest   Lie on a firm bed or floor with both legs extended in front of you.  Keeping one leg in contact with the floor, bring your opposite knee to your chest. Hold your leg in place by either grabbing behind your thigh or at your knee.  Pull until you feel a gentle stretch in your low back. Hold __________ seconds.  Slowly release your grasp and repeat the exercise with the opposite side. Repeat __________ times. Complete this exercise __________ times per day.  STRETCH  Flexion, Double Knee to Chest  Lie on a firm bed or floor with both legs extended in front of you.  Keeping one leg in contact with the floor, bring your opposite knee to your chest.  Tense your stomach muscles to support your back and then lift your other knee to your chest. Hold your legs in place by either grabbing behind your thighs or at your knees.  Pull both knees toward your chest until you feel a gentle stretch in your low back. Hold __________ seconds.  Tense your stomach muscles and slowly return one leg at a time to the floor. Repeat __________ times. Complete this exercise __________ times per day.  STRETCH  Low Trunk Rotation   Lie on a firm bed or floor. Keeping your legs in front of you, bend your knees so they are both pointed toward  the ceiling and your feet are flat on the floor.  Extend your arms out to the side. This will stabilize your upper body by keeping your shoulders in contact with the floor.  Gently and slowly drop both knees together to one side until you feel a gentle stretch in your low back. Hold for __________ seconds.  Tense your stomach muscles to support your low back as you bring your knees back to the starting position. Repeat the exercise to the other side. Repeat __________ times.  Complete this exercise __________ times per day  EXTENSION RANGE OF MOTION AND FLEXIBILITY EXERCISES: STRETCH  Extension, Prone on Elbows  Lie on your stomach on the floor, a bed will be too soft. Place your palms about shoulder width apart and at the height of your head.  Place your elbows under your shoulders. If this is too painful, stack pillows under your chest.  Allow your body to relax so that your hips drop lower and make contact more completely with the floor.  Hold this position for __________ seconds.  Slowly return to lying flat on the floor. Repeat __________ times. Complete this exercise __________ times per day.  RANGE OF MOTION  Extension, Prone Press Ups  Lie on your stomach on the floor, a bed will be too soft. Place your palms about shoulder width apart and at the height of your head.  Keeping your back as relaxed as possible, slowly straighten your elbows while keeping your hips on the floor. You may adjust the placement of your hands to maximize your comfort. As you gain motion, your hands will come more underneath your shoulders.  Hold this position __________ seconds.  Slowly return to lying flat on the floor. Repeat __________ times. Complete this exercise __________ times per day.  STRENGTHENING EXERCISES - Sciatica  These exercises may help you when beginning to rehabilitate your injury. These exercises should be done near your "sweet spot." This is the neutral, low-back arch, somewhere between fully rounded and fully arched, that is your least painful position. When performed in this safe range of motion, these exercises can be used for people who have either a flexion or extension based injury. These exercises may resolve your symptoms with or without further involvement from your physician, physical therapist or athletic trainer. While completing these exercises, remember:   Muscles can gain both the endurance and the strength needed for everyday activities  through controlled exercises.  Complete these exercises as instructed by your physician, physical therapist or athletic trainer. Progress with the resistance and repetition exercises only as your caregiver advises.  You may experience muscle soreness or fatigue, but the pain or discomfort you are trying to eliminate should never worsen during these exercises. If this pain does worsen, stop and make certain you are following the directions exactly. If the pain is still present after adjustments, discontinue the exercise until you can discuss the trouble with your clinician. STRENGTHENING Deep Abdominals, Pelvic Tilt   Lie on a firm bed or floor. Keeping your legs in front of you, bend your knees so they are both pointed toward the ceiling and your feet are flat on the floor.  Tense your lower abdominal muscles to press your low back into the floor. This motion will rotate your pelvis so that your tail bone is scooping upwards rather than pointing at your feet or into the floor.  With a gentle tension and even breathing, hold this position for __________ seconds. Repeat __________ times. Complete this  exercise __________ times per day.  STRENGTHENING  Abdominals, Crunches   Lie on a firm bed or floor. Keeping your legs in front of you, bend your knees so they are both pointed toward the ceiling and your feet are flat on the floor. Cross your arms over your chest.  Slightly tip your chin down without bending your neck.  Tense your abdominals and slowly lift your trunk high enough to just clear your shoulder blades. Lifting higher can put excessive stress on the low back and does not further strengthen your abdominal muscles.  Control your return to the starting position. Repeat __________ times. Complete this exercise __________ times per day.  STRENGTHENING  Quadruped, Opposite UE/LE Lift  Assume a hands and knees position on a firm surface. Keep your hands under your shoulders and your knees  under your hips. You may place padding under your knees for comfort.  Find your neutral spine and gently tense your abdominal muscles so that you can maintain this position. Your shoulders and hips should form a rectangle that is parallel with the floor and is not twisted.  Keeping your trunk steady, lift your right hand no higher than your shoulder and then your left leg no higher than your hip. Make sure you are not holding your breath. Hold this position __________ seconds.  Continuing to keep your abdominal muscles tense and your back steady, slowly return to your starting position. Repeat with the opposite arm and leg. Repeat __________ times. Complete this exercise __________ times per day.  STRENGTHENING  Abdominals and Quadriceps, Straight Leg Raise   Lie on a firm bed or floor with both legs extended in front of you.  Keeping one leg in contact with the floor, bend the other knee so that your foot can rest flat on the floor.  Find your neutral spine, and tense your abdominal muscles to maintain your spinal position throughout the exercise.  Slowly lift your straight leg off the floor about 6 inches for a count of 15, making sure to not hold your breath.  Still keeping your neutral spine, slowly lower your leg all the way to the floor. Repeat this exercise with each leg __________ times. Complete this exercise __________ times per day. POSTURE AND BODY MECHANICS CONSIDERATIONS - Sciatica Keeping correct posture when sitting, standing or completing your activities will reduce the stress put on different body tissues, allowing injured tissues a chance to heal and limiting painful experiences. The following are general guidelines for improved posture. Your physician or physical therapist will provide you with any instructions specific to your needs. While reading these guidelines, remember:  The exercises prescribed by your provider will help you have the flexibility and strength to  maintain correct postures.  The correct posture provides the optimal environment for your joints to work. All of your joints have less wear and tear when properly supported by a spine with good posture. This means you will experience a healthier, less painful body.  Correct posture must be practiced with all of your activities, especially prolonged sitting and standing. Correct posture is as important when doing repetitive low-stress activities (typing) as it is when doing a single heavy-load activity (lifting). RESTING POSITIONS Consider which positions are most painful for you when choosing a resting position. If you have pain with flexion-based activities (sitting, bending, stooping, squatting), choose a position that allows you to rest in a less flexed posture. You would want to avoid curling into a fetal position on your side. If  your pain worsens with extension-based activities (prolonged standing, working overhead), avoid resting in an extended position such as sleeping on your stomach. Most people will find more comfort when they rest with their spine in a more neutral position, neither too rounded nor too arched. Lying on a non-sagging bed on your side with a pillow between your knees, or on your back with a pillow under your knees will often provide some relief. Keep in mind, being in any one position for a prolonged period of time, no matter how correct your posture, can still lead to stiffness. PROPER SITTING POSTURE In order to minimize stress and discomfort on your spine, you must sit with correct posture Sitting with good posture should be effortless for a healthy body. Returning to good posture is a gradual process. Many people can work toward this most comfortably by using various supports until they have the flexibility and strength to maintain this posture on their own. When sitting with proper posture, your ears will fall over your shoulders and your shoulders will fall over your hips.  You should use the back of the chair to support your upper back. Your low back will be in a neutral position, just slightly arched. You may place a small pillow or folded towel at the base of your low back for support.  When working at a desk, create an environment that supports good, upright posture. Without extra support, muscles fatigue and lead to excessive strain on joints and other tissues. Keep these recommendations in mind: CHAIR:   A chair should be able to slide under your desk when your back makes contact with the back of the chair. This allows you to work closely.  The chair's height should allow your eyes to be level with the upper part of your monitor and your hands to be slightly lower than your elbows. BODY POSITION  Your feet should make contact with the floor. If this is not possible, use a foot rest.  Keep your ears over your shoulders. This will reduce stress on your neck and low back. INCORRECT SITTING POSTURES   If you are feeling tired and unable to assume a healthy sitting posture, do not slouch or slump. This puts excessive strain on your back tissues, causing more damage and pain. Healthier options include:  Using more support, like a lumbar pillow.  Switching tasks to something that requires you to be upright or walking.  Talking a brief walk.  Lying down to rest in a neutral-spine position. PROLONGED STANDING WHILE SLIGHTLY LEANING FORWARD  When completing a task that requires you to lean forward while standing in one place for a long time, place either foot up on a stationary 2-4 inch high object to help maintain the best posture. When both feet are on the ground, the low back tends to lose its slight inward curve. If this curve flattens (or becomes too large), then the back and your other joints will experience too much stress, fatigue more quickly and can cause pain.  CORRECT STANDING POSTURES Proper standing posture should be assumed with all daily  activities, even if they only take a few moments, like when brushing your teeth. As in sitting, your ears should fall over your shoulders and your shoulders should fall over your hips. You should keep a slight tension in your abdominal muscles to brace your spine. Your tailbone should point down to the ground, not behind your body, resulting in an over-extended swayback posture.  INCORRECT STANDING POSTURES  Common incorrect standing postures include a forward head, locked knees and/or an excessive swayback. WALKING Walk with an upright posture. Your ears, shoulders and hips should all line-up. PROLONGED ACTIVITY IN A FLEXED POSITION When completing a task that requires you to bend forward at your waist or lean over a low surface, try to find a way to stabilize 3 of 4 of your limbs. You can place a hand or elbow on your thigh or rest a knee on the surface you are reaching across. This will provide you more stability so that your muscles do not fatigue as quickly. By keeping your knees relaxed, or slightly bent, you will also reduce stress across your low back. CORRECT LIFTING TECHNIQUES DO :   Assume a wide stance. This will provide you more stability and the opportunity to get as close as possible to the object which you are lifting.  Tense your abdominals to brace your spine; then bend at the knees and hips. Keeping your back locked in a neutral-spine position, lift using your leg muscles. Lift with your legs, keeping your back straight.  Test the weight of unknown objects before attempting to lift them.  Try to keep your elbows locked down at your sides in order get the best strength from your shoulders when carrying an object.  Always ask for help when lifting heavy or awkward objects. INCORRECT LIFTING TECHNIQUES DO NOT:   Lock your knees when lifting, even if it is a small object.  Bend and twist. Pivot at your feet or move your feet when needing to change directions.  Assume that you  cannot safely pick up a paperclip without proper posture. Document Released: 11/29/2005 Document Revised: 02/21/2012 Document Reviewed: 03/13/2009 [facility] Patient Information 2013 [place], [place].

## 2013-01-07 ENCOUNTER — Encounter: Payer: Self-pay | Admitting: Radiology

## 2013-01-08 NOTE — Progress Notes (Signed)
Tried to call pt to schedule appt, cell not taking calls and home seems incorrect. Lab already sent letter for pt to call us as they were unable to reach her.

## 2013-01-11 ENCOUNTER — Telehealth: Payer: Self-pay

## 2013-01-11 ENCOUNTER — Ambulatory Visit (INDEPENDENT_AMBULATORY_CARE_PROVIDER_SITE_OTHER): Payer: Medicare Other | Admitting: Emergency Medicine

## 2013-01-11 VITALS — BP 125/77 | HR 80 | Temp 98.5°F | Resp 18 | Wt 128.0 lb

## 2013-01-11 DIAGNOSIS — S335XXA Sprain of ligaments of lumbar spine, initial encounter: Secondary | ICD-10-CM

## 2013-01-11 DIAGNOSIS — S46919A Strain of unspecified muscle, fascia and tendon at shoulder and upper arm level, unspecified arm, initial encounter: Secondary | ICD-10-CM

## 2013-01-11 NOTE — Telephone Encounter (Signed)
Pt needs call back to in regards to lab results.   [phone]

## 2013-01-11 NOTE — Progress Notes (Signed)
[facility] [address], [place] [address] [id number]  Date:  01/11/2013   Name:  Melissa Bridges   DOB:  04/09/1961   MRN:  [id number]  PCP:  Octavio Manns, MD    Chief Complaint: Back Pain   History of Present Illness:  Melissa Bridges is a 52 y.o. very pleasant female patient who presents with the following:  Says that she has a long history of low back pain which she attributes to arthritis.  Helped daughter move furniture on Tuesday and has increased low back pain.  Describes pain into her left leg to just below the knee and pain in her left shoulder as it is "out of place" and worse now.  She denies any direct injury or other complaints.  No weakness or paresthesias  Patient Active Problem List  Diagnosis  . Substance abuse  . Smoker  . Seizures  . Schizoaffective disorder    Past Medical History  Diagnosis Date  . Sciatic pain   . Seizures   . Schizoaffective disorder   . Hepatitis C   . Arthritis   . Asthma   . Migraine   . Allergy   . Depression   . Anxiety   . Hypertension   . Alopecia     Past Surgical History  Procedure Date  . Abdominal hysterectomy   . Dilation and curettage of uterus   . Exploratory laparotomy   . Ovarian cyst removal 2008    History  Substance Use Topics  . Smoking status: Current Every Day Smoker -- 1.0 packs/day  . Smokeless tobacco: Not on file  . Alcohol Use: No    Family History  Problem Relation Age of Onset  . Diabetes type II Mother   . Hypertension Mother   . Arthritis Mother   . Diabetes type II Maternal Aunt   . Cancer Maternal Uncle   . Hypertension Father   . Heart murmur Father   . Alopecia Sister   . Alopecia Brother   . Alopecia Sister     Allergies  Allergen Reactions  . Ibuprofen Other (See Comments)    Stomach upset  . Aspirin Rash and Other (See Comments)    Stomach upset    Medication list has been reviewed and updated.  Current Outpatient Prescriptions on File  Prior to Visit  Medication Sig Dispense Refill  . albuterol (PROVENTIL HFA;VENTOLIN HFA) 108 (90 BASE) MCG/ACT inhaler Inhale 2 puffs into the lungs every 4 (four) hours as needed.  1 Inhaler  11  . amLODipine (NORVASC) 10 MG tablet Take 1 tablet (10 mg total) by mouth daily.  30 tablet  1  . citalopram (CELEXA) 10 MG tablet Take 1 tablet (10 mg total) by mouth daily.  30 tablet  1  . divalproex (DEPAKOTE ER) 500 MG 24 hr tablet Take 2 tablets (1,000 mg total) by mouth daily.  30 tablet  1  . HYDROcodone-acetaminophen (VICODIN) 5-500 MG per tablet Take 1 tablet by mouth every 6 (six) hours as needed for pain.  30 tablet  0  . meloxicam (MOBIC) 15 MG tablet Take 1 tablet (15 mg total) by mouth daily.  30 tablet  0  . Methocarbamol (ROBAXIN PO) Take by mouth.      . oxyCODONE-acetaminophen (PERCOCET/ROXICET) 5-325 MG per tablet Take 2 tablets by mouth every 8 (eight) hours as needed for pain.  25 tablet  0  . QUEtiapine (SEROQUEL) 300 MG tablet Take 1 tablet (300 mg  total) by mouth 2 (two) times daily.  60 tablet  1    Review of Systems:  As per HPI, otherwise negative.     Physical Examination: Filed Vitals:   01/11/13 0945  BP: 125/77  Pulse: 80  Temp: 98.5 F (36.9 C)  Resp: 18   Filed Vitals:   01/11/13 0945  Weight: 128 lb (58.06 kg)   There is no height on file to calculate BMI. Ideal Body Weight:    GEN: WDWN, NAD, Non-toxic, Alert & Oriented x 3 HEENT: Atraumatic, Normocephalic.  Ears and Nose: No external deformity. EXTR: No clubbing/cyanosis/edema NEURO: Normal gait.  PSYCH: Normally interactive. Conversant. Not depressed or anxious appearing.  Calm demeanor.  BACK:  Lumbar tenderness. Gross motor intact Left shoulder:  Chronic AC separation and tenderness in muscles in left trapezius     Assessment and Plan:  Shoulder strain  Lumbar strain Continue robaxin Continue mobic Follow up as needed No narcotics due substance abuse history Local heat  Carmelina Dane, MD

## 2013-01-11 NOTE — Patient Instructions (Signed)

## 2013-01-12 NOTE — Telephone Encounter (Signed)
Please review and advise.

## 2013-01-12 NOTE — Telephone Encounter (Signed)
This patient is a patient seen by Dr Katrinka Blazing.  The notes say a letter was sent as the phone number is not correct.  Not sure why I received this note

## 2013-01-15 NOTE — Telephone Encounter (Signed)
LMOM to CB. 

## 2013-01-17 ENCOUNTER — Telehealth: Payer: Self-pay | Admitting: *Deleted

## 2013-01-17 NOTE — Telephone Encounter (Signed)
Spoke with patient and gave her lab results.  She will make appointment with Dr. Katrinka Blazing for follow up on her labs.

## 2013-01-17 NOTE — Progress Notes (Signed)
Appt scheduled for 2/10 with Dr. Katrinka Blazing.

## 2013-01-21 NOTE — Telephone Encounter (Signed)
Spoke with pt advised labs, pt has appt on Monday at 1230. Pt understood.

## 2013-01-22 ENCOUNTER — Encounter: Payer: Self-pay | Admitting: Family Medicine

## 2013-01-22 ENCOUNTER — Telehealth: Payer: Self-pay

## 2013-01-22 ENCOUNTER — Ambulatory Visit (INDEPENDENT_AMBULATORY_CARE_PROVIDER_SITE_OTHER): Payer: Medicare Other | Admitting: Family Medicine

## 2013-01-22 VITALS — BP 143/87 | HR 85 | Temp 98.5°F | Resp 16 | Ht 64.0 in | Wt 126.0 lb

## 2013-01-22 DIAGNOSIS — N289 Disorder of kidney and ureter, unspecified: Secondary | ICD-10-CM

## 2013-01-22 DIAGNOSIS — M25519 Pain in unspecified shoulder: Secondary | ICD-10-CM

## 2013-01-22 DIAGNOSIS — R7989 Other specified abnormal findings of blood chemistry: Secondary | ICD-10-CM | POA: Diagnosis not present

## 2013-01-22 DIAGNOSIS — M5430 Sciatica, unspecified side: Secondary | ICD-10-CM | POA: Diagnosis not present

## 2013-01-22 DIAGNOSIS — M5432 Sciatica, left side: Secondary | ICD-10-CM

## 2013-01-22 DIAGNOSIS — M25512 Pain in left shoulder: Secondary | ICD-10-CM

## 2013-01-22 LAB — POCT UA - MICROSCOPIC ONLY
Mucus, UA: NEGATIVE
WBC, Ur, HPF, POC: NEGATIVE

## 2013-01-22 LAB — COMPREHENSIVE METABOLIC PANEL
ALT: 58 U/L — ABNORMAL HIGH (ref 0–35)
Albumin: 4.5 g/dL (ref 3.5–5.2)
BUN: 9 mg/dL (ref 6–23)
CO2: 25 mEq/L (ref 19–32)
Calcium: 9.8 mg/dL (ref 8.4–10.5)
Chloride: 102 mEq/L (ref 96–112)
Creat: 0.88 mg/dL (ref 0.50–1.10)
Potassium: 3.8 mEq/L (ref 3.5–5.3)

## 2013-01-22 LAB — POCT URINALYSIS DIPSTICK
Bilirubin, UA: NEGATIVE
Ketones, UA: NEGATIVE
Leukocytes, UA: NEGATIVE
pH, UA: 7

## 2013-01-22 NOTE — Telephone Encounter (Signed)
PT WAS SEEN TODAY @ 104. AFTER SHE LEFT AND HAD TO WAIT AT THE BUS STOP IN THE COLD, SHE CALLED TO SAY THAT SHE NEEDS SOME PAIN MEDS  [facility]

## 2013-01-22 NOTE — Telephone Encounter (Signed)
I called her, to see what she is requesting, she is asking for Oxycodone, please advise.

## 2013-01-22 NOTE — Telephone Encounter (Signed)
As discussed at visit, needs to continue with Meloxicam which was recently prescribed as well as Robaxin.  I have also referred her to orthopedic specialist to address back pain further.

## 2013-01-22 NOTE — Progress Notes (Signed)
[address]   [place], [place]  [address]   [phone]  Subjective:    Patient ID: Melissa Bridges, female    DOB: 1961/10/30, 52 y.o.   MRN: [id number]  HPI This 52 y.o. female presents for evaluation of the following:  1. Renal insufficiency: during visit 12/28/2012 Creatinine of 1.32; has been drinking more water; does not like to drink water much.  Took bus to appointment.    2. Elevated LFTs: stable; known history of Hepatitis C; +iv drug use in college.  Awaiting referral to hepatitis clinic.  3. Low back pain:  Unchanged from last visit; radiation into leg; no weakness.  +tingling in bottom of feet.  Normal b/b function.  No saddle paresthesias.  Has refill of Robaxin at pharmacy.  Requesting refill of hydrocodone.  Persistent pain.  4.  Shoulder pain L:  Intermittent; improved; will throb some; chronic issue for patient.     Review of Systems  Constitutional: Negative for fever, chills, diaphoresis and fatigue.  Respiratory: Negative for shortness of breath.   Cardiovascular: Negative for chest pain, palpitations and leg swelling.  Musculoskeletal: Positive for myalgias, back pain, arthralgias and gait problem. Negative for joint swelling.  Skin: Negative for rash.        Past Medical History  Diagnosis Date  . Sciatic pain   . Seizures   . Schizoaffective disorder   . Hepatitis C   . Arthritis   . Asthma   . Migraine   . Allergy   . Depression   . Anxiety   . Hypertension   . Alopecia     Past Surgical History  Procedure Laterality Date  . Abdominal hysterectomy    . Dilation and curettage of uterus    . Exploratory laparotomy    . Ovarian cyst removal  2008    Prior to Admission medications   Medication Sig Start Date End Date Taking? Authorizing Provider  albuterol (PROVENTIL HFA;VENTOLIN HFA) 108 (90 BASE) MCG/ACT inhaler Inhale 2 puffs into the lungs every 4 (four) hours as needed. 12/12/12  Yes Collene Gobble, MD  amLODipine (NORVASC) 10 MG tablet  Take 1 tablet (10 mg total) by mouth daily. 12/12/12  Yes Collene Gobble, MD  citalopram (CELEXA) 10 MG tablet Take 1 tablet (10 mg total) by mouth daily. 12/12/12 12/12/13 Yes Collene Gobble, MD  divalproex (DEPAKOTE ER) 500 MG 24 hr tablet Take 2 tablets (1,000 mg total) by mouth daily. 12/12/12 12/12/13 Yes Collene Gobble, MD  meloxicam (MOBIC) 15 MG tablet Take 1 tablet (15 mg total) by mouth daily. 12/28/12  Yes Ethelda Chick, MD  Methocarbamol (ROBAXIN PO) Take by mouth.   Yes Historical Provider, MD  QUEtiapine (SEROQUEL) 300 MG tablet Take 1 tablet (300 mg total) by mouth 2 (two) times daily. 12/12/12  Yes Collene Gobble, MD  HYDROcodone-acetaminophen (VICODIN) 5-500 MG per tablet Take 1 tablet by mouth every 6 (six) hours as needed for pain. 12/28/12   Ethelda Chick, MD  oxyCODONE-acetaminophen (PERCOCET/ROXICET) 5-325 MG per tablet Take 2 tablets by mouth every 8 (eight) hours as needed for pain. 11/06/12   Tobey Grim, MD    Allergies  Allergen Reactions  . Ibuprofen Other (See Comments)    Stomach upset  . Aspirin Rash and Other (See Comments)    Stomach upset    History   Social History  . Marital Status: Widowed    Spouse Name: N/A    Number of Children:  N/A  . Years of Education: N/A   Occupational History  . Not on file.   Social History Main Topics  . Smoking status: Current Every Day Smoker -- 1.00 packs/day  . Smokeless tobacco: Not on file  . Alcohol Use: 0.0 oz/week  . Drug Use: No  . Sexually Active: Yes    Birth Control/ Protection: None     Comment: widow   Other Topics Concern  . Not on file   Social History Narrative   Marital status:  Widowed since 2002.  Married x 16 years.  Not dating.  Moved from [place] to live with daughter in 2013.     Children:  One child; two grandchildren      Lives: with daughter, grandchildren.      Employment:  Disability for schizoaffective disorder.      Tobacco: 1 ppd      Alcohol:  None       Drugs: none       Exercise: none    Family History  Problem Relation Age of Onset  . Diabetes type II Mother   . Hypertension Mother   . Arthritis Mother   . Diabetes type II Maternal Aunt   . Cancer Maternal Uncle   . Hypertension Father   . Heart murmur Father   . Alopecia Sister   . Alopecia Brother   . Alopecia Sister     Objective:   Physical Exam  Nursing note and vitals reviewed. Constitutional: She is oriented to person, place, and time. She appears well-developed and well-nourished. No distress.  Eyes: Conjunctivae are normal. Pupils are equal, round, and reactive to light.  Neck: Normal range of motion. Neck supple. No thyromegaly present.  Cardiovascular: Normal rate, regular rhythm, normal heart sounds and intact distal pulses.  Exam reveals no gallop and no friction rub.   No murmur heard. Pulmonary/Chest: Effort normal and breath sounds normal. She has no wheezes. She has no rales.  Musculoskeletal:       Right shoulder: Normal.       Left shoulder: She exhibits normal range of motion, no tenderness, no bony tenderness, no swelling, no deformity, no pain, no spasm and normal strength.       Lumbar back: She exhibits normal range of motion, no tenderness, no bony tenderness, no swelling, no pain and no spasm.  Lumbar spine:  No midline TTP; straight leg raises equivocal; motor 5/5 BLE; toe and heel walking intact; marching intact.  Non-tender paraspinal regions.  Lymphadenopathy:    She has no cervical adenopathy.  Neurological: She is alert and oriented to person, place, and time. She has normal reflexes.  Skin: She is not diaphoretic.  Psychiatric: She has a normal mood and affect. Her behavior is normal.          Results for orders placed in visit on 01/22/13  POCT UA - MICROSCOPIC ONLY      Result Value Range   WBC, Ur, HPF, POC neg     RBC, urine, microscopic 0-4     Bacteria, U Microscopic neg     Mucus, UA neg     Epithelial cells, urine per micros 0-2      Crystals, Ur, HPF, POC neg     Casts, Ur, LPF, POC neg     Yeast, UA neg    POCT URINALYSIS DIPSTICK      Result Value Range   Color, UA yellow     Clarity, UA clear  Glucose, UA neg     Bilirubin, UA neg     Ketones, UA neg     Spec Grav, UA <=1.005     Blood, UA trace     pH, UA 7.0     Protein, UA neg     Urobilinogen, UA 0.2     Nitrite, UA neg     Leukocytes, UA Negative      Assessment & Plan:   1. Elevated LFTs  POCT UA - Microscopic Only   POCT UA - Microscopic Only   POCT urinalysis dipstick   Comprehensive metabolic panel  2. Acute renal insufficiency    3. Sciatica neuralgia, left  Ambulatory referral to Orthopedic Surgery   Ambulatory referral to Orthopedic Surgery  4. Shoulder pain, left  Ambulatory referral to Orthopedic Surgery   Ambulatory referral to Orthopedic Surgery    1.  Elevated LFTs: improved at last visit; known Hepatitis C; clarify date of appointment with Hepatitis C clinic. 2.  Acute renal insufficiency:  New at visit 12/28/12.  Repeat labs today; asymptomatic. 3.  L Sciatica: Persistent; refer to ortho for further management.  No new medications provided. 4.  L Shoulder Pain: Improved but persistent; refer to ortho.

## 2013-01-22 NOTE — Patient Instructions (Addendum)
1. Elevated LFTs  POCT UA - Microscopic Only   POCT UA - Microscopic Only   POCT urinalysis dipstick   Comprehensive metabolic panel  2. Acute renal insufficiency    3. Sciatica neuralgia, left  Ambulatory referral to Orthopedic Surgery   Ambulatory referral to Orthopedic Surgery  4. Shoulder pain, left  Ambulatory referral to Orthopedic Surgery   Ambulatory referral to Orthopedic Surgery

## 2013-01-23 NOTE — Telephone Encounter (Signed)
I have called her to advise.  

## 2013-02-09 DIAGNOSIS — IMO0002 Reserved for concepts with insufficient information to code with codable children: Secondary | ICD-10-CM | POA: Diagnosis not present

## 2013-02-14 DIAGNOSIS — R7989 Other specified abnormal findings of blood chemistry: Secondary | ICD-10-CM | POA: Diagnosis not present

## 2013-02-14 DIAGNOSIS — B182 Chronic viral hepatitis C: Secondary | ICD-10-CM | POA: Diagnosis not present

## 2013-02-20 ENCOUNTER — Other Ambulatory Visit: Payer: Self-pay | Admitting: *Deleted

## 2013-02-20 DIAGNOSIS — B1920 Unspecified viral hepatitis C without hepatic coma: Secondary | ICD-10-CM

## 2013-02-20 DIAGNOSIS — R7989 Other specified abnormal findings of blood chemistry: Secondary | ICD-10-CM

## 2013-03-08 ENCOUNTER — Ambulatory Visit: Payer: Medicare Other

## 2013-03-08 ENCOUNTER — Ambulatory Visit (INDEPENDENT_AMBULATORY_CARE_PROVIDER_SITE_OTHER): Payer: Medicare Other | Admitting: Family Medicine

## 2013-03-08 VITALS — BP 110/78 | HR 92 | Temp 98.5°F | Resp 16 | Ht 64.0 in | Wt 129.0 lb

## 2013-03-08 DIAGNOSIS — M545 Low back pain, unspecified: Secondary | ICD-10-CM | POA: Diagnosis not present

## 2013-03-08 DIAGNOSIS — B1920 Unspecified viral hepatitis C without hepatic coma: Secondary | ICD-10-CM

## 2013-03-08 DIAGNOSIS — F259 Schizoaffective disorder, unspecified: Secondary | ICD-10-CM

## 2013-03-08 DIAGNOSIS — J45909 Unspecified asthma, uncomplicated: Secondary | ICD-10-CM | POA: Diagnosis not present

## 2013-03-08 DIAGNOSIS — J45901 Unspecified asthma with (acute) exacerbation: Secondary | ICD-10-CM | POA: Diagnosis not present

## 2013-03-08 DIAGNOSIS — R062 Wheezing: Secondary | ICD-10-CM

## 2013-03-08 DIAGNOSIS — I10 Essential (primary) hypertension: Secondary | ICD-10-CM

## 2013-03-08 DIAGNOSIS — R059 Cough, unspecified: Secondary | ICD-10-CM

## 2013-03-08 DIAGNOSIS — R05 Cough: Secondary | ICD-10-CM

## 2013-03-08 DIAGNOSIS — R569 Unspecified convulsions: Secondary | ICD-10-CM

## 2013-03-08 DIAGNOSIS — B182 Chronic viral hepatitis C: Secondary | ICD-10-CM | POA: Insufficient documentation

## 2013-03-08 MED ORDER — ALBUTEROL SULFATE (2.5 MG/3ML) 0.083% IN NEBU
2.5000 mg | INHALATION_SOLUTION | Freq: Once | RESPIRATORY_TRACT | Status: AC
  Administered 2013-03-08: 2.5 mg via RESPIRATORY_TRACT

## 2013-03-08 MED ORDER — ALBUTEROL SULFATE HFA 108 (90 BASE) MCG/ACT IN AERS: 2.0000 | INHALATION_SPRAY | RESPIRATORY_TRACT | Status: DC | PRN

## 2013-03-08 MED ORDER — HYDROCODONE-ACETAMINOPHEN 5-325 MG PO TABS: 1.0000 | ORAL_TABLET | Freq: Four times a day (QID) | ORAL | Status: DC | PRN

## 2013-03-08 MED ORDER — AMLODIPINE BESYLATE 10 MG PO TABS: 10.0000 mg | ORAL_TABLET | Freq: Every day | ORAL | Status: DC

## 2013-03-08 MED ORDER — DIVALPROEX SODIUM ER 500 MG PO TB24: 1000.0000 mg | ORAL_TABLET | Freq: Every day | ORAL | Status: DC

## 2013-03-08 MED ORDER — MELOXICAM 15 MG PO TABS: 15.0000 mg | ORAL_TABLET | Freq: Every day | ORAL | Status: DC

## 2013-03-08 MED ORDER — BUDESONIDE-FORMOTEROL FUMARATE 80-4.5 MCG/ACT IN AERO: 2.0000 | INHALATION_SPRAY | Freq: Two times a day (BID) | RESPIRATORY_TRACT | Status: DC

## 2013-03-08 MED ORDER — METHYLPREDNISOLONE ACETATE 80 MG/ML IJ SUSP
80.0000 mg | Freq: Once | INTRAMUSCULAR | Status: AC
  Administered 2013-03-08: 80 mg via INTRAMUSCULAR

## 2013-03-08 NOTE — Patient Instructions (Addendum)
Cough - Plan: albuterol (PROVENTIL) (2.5 MG/3ML) 0.083% nebulizer solution 2.5 mg, DG Chest 2 View  Asthma with acute exacerbation - Plan: budesonide-formoterol (SYMBICORT) 80-4.5 MCG/ACT inhaler, methylPREDNISolone acetate (DEPO-MEDROL) injection 80 mg  Wheezing - Plan: albuterol (PROVENTIL) (2.5 MG/3ML) 0.083% nebulizer solution 2.5 mg, DG Chest 2 View  Seizures - Plan: divalproex (DEPAKOTE ER) 500 MG 24 hr tablet  Schizoaffective disorder  Hepatitis C  Hypertension - Plan: amLODipine (NORVASC) 10 MG tablet  Asthma - Plan: albuterol (PROVENTIL HFA;VENTOLIN HFA) 108 (90 BASE) MCG/ACT inhaler  Lower back pain - Plan: meloxicam (MOBIC) 15 MG tablet, HYDROcodone-acetaminophen (NORCO/VICODIN) 5-325 MG per tablet    1.  START SYMBICORT INHALER TWO PUFFS TWICE DAILY FOR ASTHMA PREVENTION.

## 2013-03-08 NOTE — Progress Notes (Signed)
[address]   [place], [place]  [address]   [phone]  Subjective:    Patient ID: Melissa Bridges, female    DOB: 1961/02/07, 52 y.o.   MRN: [id number]  HPI This 52 y.o. female presents for evaluation of the following:  1.  Low back pain: s/p ortho consult after last visit;  Needs copy of insurance sent to ortho group; needs to know if insurance will cover MRI at certain facility.  Did not have insurance cards at visit; to be scheduled for MRI.  PA Jason Coop by [facility].  Prescribed Prednisone, Robaxin.  Prednisone interferes with mental health medicines.    2. HTN: stable; reports compliance with medication; good tolerance with medication; good symptom control. Needs refill on medication.    3. Seizure disorder:  Needs refill on Depakote.   No neurology referral yet.  [facility] has attempted to contact patient on several occasions and unable to reach.  4.  Hepatitis C:  S/p consultation by Hepatitis Clinic.  To undergo evaluation with abdominal u/s next week.    S/p Hepatitis panel; +HCV antibodies; +Hepatitis A ab; +Hepatita B core ab.  Deborah Chalk, MD.  Recommending experimental drug or other established drug.  To determine treatment.  5. Cough:  With wheezing for past week.  Onset few months ago.  +feverish at night low grade.  No rhinorrhea; no nasal congestion.  No sore throat or ear pain.  +HA.  Dry cough.  If coughs, able to breathe better.  History of asthma; +wheezing.  Out of Proair; needs refill.  Was using Proair bid; also has nebulizer of granddaughters; out of medication.  Diagnosed with asthma fifteen years ago in [place].   Review of Systems  Constitutional: Negative for fever, chills, diaphoresis and fatigue.  HENT: Negative for ear pain, congestion, rhinorrhea, sneezing and postnasal drip.   Respiratory: Positive for cough, shortness of breath and wheezing.   Cardiovascular: Negative for chest pain, palpitations and leg swelling.    Gastrointestinal: Negative for abdominal pain and abdominal distention.  Musculoskeletal: Positive for myalgias, back pain and gait problem.        Past Medical History  Diagnosis Date  . Sciatic pain   . Seizures   . Schizoaffective disorder   . Hepatitis C   . Arthritis   . Asthma   . Migraine   . Allergy   . Depression   . Anxiety   . Hypertension   . Alopecia   . Substance abuse     Past Surgical History  Procedure Laterality Date  . Abdominal hysterectomy    . Dilation and curettage of uterus    . Exploratory laparotomy    . Ovarian cyst removal  2008    Prior to Admission medications   Medication Sig Start Date End Date Taking? Authorizing Provider  albuterol (PROVENTIL HFA;VENTOLIN HFA) 108 (90 BASE) MCG/ACT inhaler Inhale 2 puffs into the lungs every 4 (four) hours as needed. 12/12/12  Yes Collene Gobble, MD  amLODipine (NORVASC) 10 MG tablet Take 1 tablet (10 mg total) by mouth daily. 12/12/12  Yes Collene Gobble, MD  citalopram (CELEXA) 10 MG tablet Take 1 tablet (10 mg total) by mouth daily. 12/12/12 12/12/13 Yes Collene Gobble, MD  divalproex (DEPAKOTE ER) 500 MG 24 hr tablet Take 2 tablets (1,000 mg total) by mouth daily. 12/12/12 12/12/13 Yes Collene Gobble, MD  meloxicam (MOBIC) 15 MG tablet Take 1 tablet (15 mg total) by mouth daily.  12/28/12  Yes Ethelda Chick, MD  Methocarbamol (ROBAXIN PO) Take by mouth.   Yes Historical Provider, MD  oxyCODONE-acetaminophen (PERCOCET/ROXICET) 5-325 MG per tablet Take 2 tablets by mouth every 8 (eight) hours as needed for pain. 11/06/12  Yes Tobey Grim, MD  QUEtiapine (SEROQUEL) 300 MG tablet Take 1 tablet (300 mg total) by mouth 2 (two) times daily. 12/12/12  Yes Collene Gobble, MD    Allergies  Allergen Reactions  . Ibuprofen Other (See Comments)    Stomach upset  . Aspirin Rash and Other (See Comments)    Stomach upset    History   Social History  . Marital Status: Widowed    Spouse Name: N/A    Number  of Children: N/A  . Years of Education: N/A   Occupational History  . Not on file.   Social History Main Topics  . Smoking status: Current Every Day Smoker -- 1.00 packs/day  . Smokeless tobacco: Not on file  . Alcohol Use: 0.0 oz/week  . Drug Use: No  . Sexually Active: Yes    Birth Control/ Protection: None     Comment: widow   Other Topics Concern  . Not on file   Social History Narrative   Marital status:  Widowed since 2002.  Married x 16 years.  Not dating.  Moved from [place] to live with daughter in 2013.     Children:  One child; two grandchildren      Lives: with daughter, grandchildren.      Employment:  Disability for schizoaffective disorder.      Tobacco: 1 ppd      Alcohol:  None       Drugs: none      Exercise: none    Family History  Problem Relation Age of Onset  . Diabetes type II Mother   . Hypertension Mother   . Arthritis Mother   . Diabetes type II Maternal Aunt   . Cancer Maternal Uncle   . Hypertension Father   . Heart murmur Father   . Alopecia Sister   . Alopecia Brother   . Alopecia Sister     Objective:   Physical Exam  Nursing note and vitals reviewed. Constitutional: She is oriented to person, place, and time. She appears well-developed and well-nourished. No distress.  HENT:  Head: Normocephalic and atraumatic.  Right Ear: External ear normal.  Left Ear: External ear normal.  Nose: Nose normal.  Mouth/Throat: Oropharynx is clear and moist.  Eyes: Conjunctivae and EOM are normal. Pupils are equal, round, and reactive to light.  Neck: Normal range of motion. Neck supple. No thyromegaly present.  Cardiovascular: Normal rate, regular rhythm and normal heart sounds.  Exam reveals no gallop and no friction rub.   No murmur heard. Pulmonary/Chest: No accessory muscle usage. Tachypnea noted. She is in respiratory distress. She has decreased breath sounds in the right lower field and the left lower field. She has wheezes. She has no  rhonchi. She has no rales.  SCANT EXPIRATORY WHEEZING B AND DIFFUSELY.  Lymphadenopathy:    She has no cervical adenopathy.  Neurological: She is alert and oriented to person, place, and time. No cranial nerve deficit. She exhibits normal muscle tone. Coordination normal.  Skin: Skin is warm and dry. No rash noted. She is not diaphoretic. No erythema.  Psychiatric: She has a normal mood and affect. Her behavior is normal.    ALBUTEROL NEBULIZER ADMINISTERED DURING VISIT.    [facility] reading (PRIMARY)  by  Dr. Katrinka Blazing. CXR:  NAD.  DEPOMEDROL 80MG  IM ADMINISTERED BY TIFFANY.     Assessment & Plan:  Cough - Plan: albuterol (PROVENTIL) (2.5 MG/3ML) 0.083% nebulizer solution 2.5 mg, DG Chest 2 View  Asthma with acute exacerbation - Plan: budesonide-formoterol (SYMBICORT) 80-4.5 MCG/ACT inhaler, methylPREDNISolone acetate (DEPO-MEDROL) injection 80 mg  Wheezing - Plan: albuterol (PROVENTIL) (2.5 MG/3ML) 0.083% nebulizer solution 2.5 mg, DG Chest 2 View  Seizures - Plan: divalproex (DEPAKOTE ER) 500 MG 24 hr tablet  Schizoaffective disorder  Hepatitis C  Hypertension - Plan: amLODipine (NORVASC) 10 MG tablet  Asthma - Plan: albuterol (PROVENTIL HFA;VENTOLIN HFA) 108 (90 BASE) MCG/ACT inhaler  Lower back pain - Plan: meloxicam (MOBIC) 15 MG tablet, HYDROcodone-acetaminophen (NORCO/VICODIN) 5-325 MG per tablet    Meds ordered this encounter  Medications  . albuterol (PROVENTIL) (2.5 MG/3ML) 0.083% nebulizer solution 2.5 mg    Sig:   . amLODipine (NORVASC) 10 MG tablet    Sig: Take 1 tablet (10 mg total) by mouth daily.    Dispense:  30 tablet    Refill:  1  . divalproex (DEPAKOTE ER) 500 MG 24 hr tablet    Sig: Take 2 tablets (1,000 mg total) by mouth daily.    Dispense:  30 tablet    Refill:  1  . meloxicam (MOBIC) 15 MG tablet    Sig: Take 1 tablet (15 mg total) by mouth daily.    Dispense:  30 tablet    Refill:  0  . albuterol (PROVENTIL HFA;VENTOLIN HFA) 108 (90 BASE) MCG/ACT  inhaler    Sig: Inhale 2 puffs into the lungs every 4 (four) hours as needed.    Dispense:  1 Inhaler    Refill:  11  . HYDROcodone-acetaminophen (NORCO/VICODIN) 5-325 MG per tablet    Sig: Take 1 tablet by mouth every 6 (six) hours as needed for pain.    Dispense:  30 tablet    Refill:  0  . budesonide-formoterol (SYMBICORT) 80-4.5 MCG/ACT inhaler    Sig: Inhale 2 puffs into the lungs 2 (two) times daily.    Dispense:  1 Inhaler    Refill:  3  . methylPREDNISolone acetate (DEPO-MEDROL) injection 80 mg    Sig:

## 2013-03-09 DIAGNOSIS — I10 Essential (primary) hypertension: Secondary | ICD-10-CM | POA: Insufficient documentation

## 2013-03-09 DIAGNOSIS — M545 Low back pain, unspecified: Secondary | ICD-10-CM | POA: Insufficient documentation

## 2013-03-09 DIAGNOSIS — J45901 Unspecified asthma with (acute) exacerbation: Secondary | ICD-10-CM | POA: Insufficient documentation

## 2013-03-09 NOTE — Assessment & Plan Note (Signed)
New.  S/p Albuterol nebulizer in office.  S/p DepoMedrol 80mg  IM in office; refill of Albuterol provided. Also provided with rx for Symbicort to use bid.

## 2013-03-09 NOTE — Assessment & Plan Note (Signed)
Stable; pt has been contacted twice with psychiatry information; pt responsible for making appointment; will attempt to coordinate referral again.

## 2013-03-09 NOTE — Assessment & Plan Note (Signed)
Controlled; refill of medication provided; recent labs thus will not obtain today.

## 2013-03-09 NOTE — Assessment & Plan Note (Signed)
Stable; has established with hepatitis clinic; scheduled for abdominal u/s.

## 2013-03-09 NOTE — Assessment & Plan Note (Signed)
Persistent; to undergo MRI lumbar spine; rx for Hydrocodone provided but advised this is to be used sparingly.

## 2013-03-09 NOTE — Assessment & Plan Note (Signed)
Stable; has not scheduled appointment with neurology yet; [facility] has attempted to contact patient on several occasions.  Refill of Depakote provided.

## 2013-03-13 ENCOUNTER — Other Ambulatory Visit: Payer: Medicare Other

## 2013-04-18 ENCOUNTER — Encounter: Payer: Self-pay | Admitting: Family Medicine

## 2013-04-18 ENCOUNTER — Ambulatory Visit (INDEPENDENT_AMBULATORY_CARE_PROVIDER_SITE_OTHER): Payer: Medicare Other | Admitting: Family Medicine

## 2013-04-18 ENCOUNTER — Other Ambulatory Visit: Payer: Self-pay | Admitting: Family Medicine

## 2013-04-18 VITALS — BP 133/77 | HR 84 | Temp 98.5°F | Resp 20 | Wt 123.0 lb

## 2013-04-18 DIAGNOSIS — R569 Unspecified convulsions: Secondary | ICD-10-CM

## 2013-04-18 DIAGNOSIS — F17200 Nicotine dependence, unspecified, uncomplicated: Secondary | ICD-10-CM

## 2013-04-18 DIAGNOSIS — Z139 Encounter for screening, unspecified: Secondary | ICD-10-CM | POA: Diagnosis not present

## 2013-04-18 DIAGNOSIS — F259 Schizoaffective disorder, unspecified: Secondary | ICD-10-CM

## 2013-04-18 DIAGNOSIS — J45901 Unspecified asthma with (acute) exacerbation: Secondary | ICD-10-CM | POA: Diagnosis not present

## 2013-04-18 DIAGNOSIS — I10 Essential (primary) hypertension: Secondary | ICD-10-CM

## 2013-04-18 DIAGNOSIS — Z01419 Encounter for gynecological examination (general) (routine) without abnormal findings: Secondary | ICD-10-CM

## 2013-04-18 DIAGNOSIS — Z1231 Encounter for screening mammogram for malignant neoplasm of breast: Secondary | ICD-10-CM

## 2013-04-18 DIAGNOSIS — Z131 Encounter for screening for diabetes mellitus: Secondary | ICD-10-CM

## 2013-04-18 DIAGNOSIS — M545 Low back pain, unspecified: Secondary | ICD-10-CM

## 2013-04-18 DIAGNOSIS — Z0000 Encounter for general adult medical examination without abnormal findings: Secondary | ICD-10-CM

## 2013-04-18 DIAGNOSIS — J029 Acute pharyngitis, unspecified: Secondary | ICD-10-CM

## 2013-04-18 LAB — POCT UA - MICROSCOPIC ONLY
Mucus, UA: POSITIVE
Yeast, UA: NEGATIVE

## 2013-04-18 LAB — POCT URINALYSIS DIPSTICK
Bilirubin, UA: NEGATIVE
Glucose, UA: NEGATIVE
Spec Grav, UA: 1.015

## 2013-04-18 LAB — IFOBT (OCCULT BLOOD): IFOBT: NEGATIVE

## 2013-04-18 MED ORDER — ALBUTEROL SULFATE (2.5 MG/3ML) 0.083% IN NEBU
2.5000 mg | INHALATION_SOLUTION | Freq: Once | RESPIRATORY_TRACT | Status: AC
  Administered 2013-04-18: 2.5 mg via RESPIRATORY_TRACT

## 2013-04-18 NOTE — Progress Notes (Signed)
[address]   [place], [place]  [address]   [phone]  Subjective:    Patient ID: Melissa Bridges, female    DOB: 19-Feb-1961, 52 y.o.   MRN: [id number]  HPI This 52 y.o. female presents for Annual Wellness Exam.  Last Annual Exam three years ago. Pap smear years ago. Mammogram never; interested in mammogram.  Colonoscopy never. Tetanus vaccine less than 10 years. Pneumovax 2012. Influenza vaccine 2013. Eye exam three years ago.  +glaucoma diagnosis in past. Dental exam +dentures.  1.  HTN: checking blood pressure as needed at home; daughter checks it periodically.  Reports compliance with medications; good tolerance with medications; good symptom control.  2.  Cough, wheezing: onset last week; everyone at home sick. No fever/chills/sweats.  No headache; +ST; no ear pain; +mild rhinorrhea and nasal congestion.  +cough; +wheezing.  Out of Proair; has Symbicort but not using much.  No v/d.  No rash. Using Nyquil.  3.  Schizoaffective disorder: has not made appointment with psychiatry; has been referred x 2 from [facility]; work conflict with daughter's work.  4.  Seizure disorder: has not made appointment with neurologist; having transportation issues with daughter's work schedule.  Has been referred x 2 by [facility] to neurology.  5.  Sciatica: still has not completed MRI per ortho; no follow-up with ortho until MRI to be completed.   Review of Systems  Constitutional: Negative for fever, chills, diaphoresis, activity change, appetite change, fatigue and unexpected weight change.  HENT: Positive for sore throat. Negative for hearing loss, ear pain, nosebleeds, congestion, facial swelling, rhinorrhea, sneezing, drooling, mouth sores, trouble swallowing, neck pain, neck stiffness, dental problem, voice change, postnasal drip, sinus pressure, tinnitus and ear discharge.   Eyes: Negative for photophobia, pain, discharge, redness, itching and visual disturbance.  Respiratory: Positive for cough,  shortness of breath and wheezing. Negative for stridor.   Cardiovascular: Negative for chest pain, palpitations and leg swelling.  Gastrointestinal: Negative for nausea, vomiting, abdominal pain, diarrhea, constipation, blood in stool, abdominal distention, anal bleeding and rectal pain.  Endocrine: Negative for cold intolerance, heat intolerance, polydipsia, polyphagia and polyuria.  Genitourinary: Negative for dysuria, urgency, frequency, hematuria, flank pain, decreased urine volume, vaginal bleeding, vaginal discharge, enuresis, difficulty urinating, genital sores, pelvic pain and dyspareunia.       Nocturia x multiple (5-6).  Musculoskeletal: Positive for myalgias and back pain. Negative for joint swelling, arthralgias and gait problem.  Skin: Negative for color change, pallor, rash and wound.  Allergic/Immunologic: Negative for environmental allergies, food allergies and immunocompromised state.  Neurological: Positive for seizures. Negative for dizziness, tremors, syncope, facial asymmetry, speech difficulty, weakness, light-headedness, numbness and headaches.  Hematological: Negative for adenopathy. Does not bruise/bleed easily.  Psychiatric/Behavioral: Positive for agitation. Negative for behavioral problems, confusion and decreased concentration.    Past Medical History  Diagnosis Date  . Sciatic pain   . Schizoaffective disorder   . Hepatitis C   . Arthritis   . Asthma   . Migraine   . Allergy   . Depression   . Anxiety   . Alopecia   . Substance abuse   . Hypertension     onset age 1.  . Seizures     onset in childhood.  Generalized tonic-clonic.      Past Surgical History  Procedure Laterality Date  . Dilation and curettage of uterus    . Exploratory laparotomy    . Ovarian cyst removal  2008  . Abdominal hysterectomy  ovarian cyst B, cervical dysplasia, fibroids.   Ovaries intact.  . Behavioral health admissions      multiple    Prior to Admission  medications   Medication Sig Start Date End Date Taking? Authorizing Provider  albuterol (PROVENTIL HFA;VENTOLIN HFA) 108 (90 BASE) MCG/ACT inhaler Inhale 2 puffs into the lungs every 4 (four) hours as needed. 03/08/13  Yes Ethelda Chick, MD  amLODipine (NORVASC) 10 MG tablet Take 1 tablet (10 mg total) by mouth daily. 03/08/13  Yes Ethelda Chick, MD  budesonide-formoterol [facility]) 80-4.5 MCG/ACT inhaler Inhale 2 puffs into the lungs 2 (two) times daily. 03/08/13  Yes Ethelda Chick, MD  citalopram (CELEXA) 10 MG tablet Take 1 tablet (10 mg total) by mouth daily. 12/12/12 12/12/13 Yes Collene Gobble, MD  divalproex (DEPAKOTE ER) 500 MG 24 hr tablet Take 2 tablets (1,000 mg total) by mouth daily. 03/08/13 03/08/14 Yes Ethelda Chick, MD  HYDROcodone-acetaminophen (NORCO/VICODIN) 5-325 MG per tablet Take 1 tablet by mouth every 6 (six) hours as needed for pain. 03/08/13  Yes Ethelda Chick, MD  meloxicam (MOBIC) 15 MG tablet Take 1 tablet (15 mg total) by mouth daily. 03/08/13  Yes Ethelda Chick, MD  Methocarbamol (ROBAXIN PO) Take by mouth.   Yes Historical Provider, MD  oxyCODONE-acetaminophen (PERCOCET/ROXICET) 5-325 MG per tablet Take 2 tablets by mouth every 8 (eight) hours as needed for pain. 11/06/12  Yes Tobey Grim, MD  QUEtiapine (SEROQUEL) 300 MG tablet Take 1 tablet (300 mg total) by mouth 2 (two) times daily. 12/12/12  Yes Collene Gobble, MD    Allergies  Allergen Reactions  . Ibuprofen Other (See Comments)    Stomach upset  . Aspirin Rash and Other (See Comments)    Stomach upset    History   Social History  . Marital Status: Widowed    Spouse Name: N/A    Number of Children: N/A  . Years of Education: N/A   Occupational History  . Not on file.   Social History Main Topics  . Smoking status: Current Every Day Smoker -- 1.00 packs/day  . Smokeless tobacco: Not on file  . Alcohol Use: 0.0 oz/week  . Drug Use: No  . Sexually Active: Yes    Birth Control/ Protection:  None     Comment: widow   Other Topics Concern  . Not on file   Social History Narrative   Marital status:  Widowed since 2002.  Married x 16 years.  Not dating.  Moved from [place] to live with daughter in 2013.     Children:  One child/daughter (33); two grandchildren.      Lives: with daughter, grandchildren 2.  Does not drive due to epilepsy.      Employment:  Disability for schizoaffective disorder.      Tobacco: 1 ppd x since 8th grade.      Alcohol:  Social; rare drinking due to seizure medications.       Drugs: none; previous use of marijuana.  Previous iv drug use, cocaine.      Exercise: none      Seatbelt:  100%      Guns: none    Family History  Problem Relation Age of Onset  . Diabetes type II Mother   . Hypertension Mother   . Arthritis Mother   . Diabetes type II Maternal Aunt   . Cancer Maternal Uncle   . Hypertension Father   . Heart murmur Father   .  Arthritis Father   . Alopecia Sister   . Alopecia Brother   . Alopecia Sister   . Mental retardation Sister         Objective:   Physical Exam  Nursing note and vitals reviewed. Constitutional: She is oriented to person, place, and time. She appears well-developed and well-nourished. No distress.  HENT:  Head: Normocephalic and atraumatic.  Right Ear: External ear normal.  Left Ear: External ear normal.  Nose: Nose normal.  Mouth/Throat: Oropharynx is clear and moist.  Eyes: Conjunctivae and EOM are normal. Pupils are equal, round, and reactive to light.  Neck: Normal range of motion. Neck supple. No JVD present. No thyromegaly present.  Cardiovascular: Normal rate, regular rhythm, normal heart sounds and intact distal pulses.  Exam reveals no gallop and no friction rub.   No murmur heard. Pulmonary/Chest: Effort normal. No accessory muscle usage. Not tachypneic. No respiratory distress. She has wheezes in the right upper field, the right middle field, the right lower field, the left upper field,  the left middle field and the left lower field. She has no rhonchi. She has no rales.  Abdominal: Soft. Bowel sounds are normal. She exhibits no distension and no mass. There is no tenderness. There is no rebound and no guarding. Hernia confirmed negative in the right inguinal area and confirmed negative in the left inguinal area.  Genitourinary: Vagina normal. Rectal exam shows external hemorrhoid. No breast swelling, tenderness, discharge or bleeding. There is no rash, tenderness or lesion on the right labia. There is no rash, tenderness or lesion on the left labia. Right adnexum displays no mass, no tenderness and no fullness. Left adnexum displays no mass, no tenderness and no fullness.  Musculoskeletal:       Right shoulder: Normal.       Left shoulder: Normal.       Cervical back: Normal.       Thoracic back: Normal.       Lumbar back: Normal.  Lymphadenopathy:    She has no cervical adenopathy.       Right: No inguinal adenopathy present.       Left: No inguinal adenopathy present.  Neurological: She is alert and oriented to person, place, and time. She has normal reflexes. No cranial nerve deficit. She exhibits normal muscle tone. Coordination normal.  Skin: Skin is warm and dry. No rash noted. She is not diaphoretic.  Psychiatric: She has a normal mood and affect. Her behavior is normal. Judgment and thought content normal.   EKG:  NSR.       Assessment & Plan:  Routine general medical examination at a health care facility - Plan: POCT urinalysis dipstick, IFOBT POC (occult bld, rslt in office), POCT UA - Microscopic Only, CBC, Comprehensive metabolic panel, Lipid panel, Hemoglobin A1c, Pap IG (Image Guided), EKG 12-Lead  Routine gynecological examination - Plan: Pap IG (Image Guided)  Essential hypertension, benign - Plan: Comprehensive metabolic panel, Lipid panel  Acute pharyngitis - Plan: CBC, albuterol (PROVENTIL) (2.5 MG/3ML) 0.083% nebulizer solution 2.5 mg  Screening  for diabetes mellitus - Plan: Hemoglobin A1c  Asthma with acute exacerbation  Lower back pain  Schizoaffective disorder  Seizures  Smoker  Meds ordered this encounter  Medications  . albuterol (PROVENTIL) (2.5 MG/3ML) 0.083% nebulizer solution 2.5 mg    Sig:

## 2013-04-19 ENCOUNTER — Telehealth: Payer: Self-pay

## 2013-04-19 LAB — COMPREHENSIVE METABOLIC PANEL
Albumin: 4.4 g/dL (ref 3.5–5.2)
Alkaline Phosphatase: 68 U/L (ref 39–117)
BUN: 8 mg/dL (ref 6–23)
Creat: 0.85 mg/dL (ref 0.50–1.10)
Glucose, Bld: 83 mg/dL (ref 70–99)
Potassium: 4.1 mEq/L (ref 3.5–5.3)

## 2013-04-19 LAB — CBC
HCT: 30 % — ABNORMAL LOW (ref 36.0–46.0)
Hemoglobin: 9.4 g/dL — ABNORMAL LOW (ref 12.0–15.0)
MCH: 26.9 pg (ref 26.0–34.0)
MCHC: 31.3 g/dL (ref 30.0–36.0)
MCV: 86 fL (ref 78.0–100.0)
RDW: 16.9 % — ABNORMAL HIGH (ref 11.5–15.5)

## 2013-04-19 LAB — LIPID PANEL
HDL: 65 mg/dL (ref >39)
Total CHOL/HDL Ratio: 3 Ratio
Triglycerides: 144 mg/dL (ref <150)

## 2013-04-19 NOTE — Telephone Encounter (Signed)
[facility] needs a test code for patient's pap. Best # [phone] ext [phone].

## 2013-04-19 NOTE — Telephone Encounter (Signed)
The test code is clarified by Vernona Rieger, in the lab, this is done.

## 2013-04-19 NOTE — Telephone Encounter (Signed)
V70.0 called to advise.

## 2013-04-20 LAB — PAP IG (IMAGE GUIDED)

## 2013-04-28 ENCOUNTER — Other Ambulatory Visit: Payer: Self-pay | Admitting: Emergency Medicine

## 2013-05-03 NOTE — Progress Notes (Signed)
Appt made for 05/22/13.

## 2013-05-12 DIAGNOSIS — M545 Low back pain, unspecified: Secondary | ICD-10-CM | POA: Diagnosis not present

## 2013-05-12 DIAGNOSIS — M5126 Other intervertebral disc displacement, lumbar region: Secondary | ICD-10-CM | POA: Diagnosis not present

## 2013-05-22 ENCOUNTER — Ambulatory Visit: Payer: Medicare Other | Admitting: Family Medicine

## 2013-05-24 ENCOUNTER — Other Ambulatory Visit: Payer: Self-pay | Admitting: Orthopedic Surgery

## 2013-05-24 DIAGNOSIS — M545 Low back pain, unspecified: Secondary | ICD-10-CM

## 2013-06-02 ENCOUNTER — Ambulatory Visit
Admission: RE | Admit: 2013-06-02 | Discharge: 2013-06-02 | Disposition: A | Discharge status: Home / Self Care | Payer: Medicare Other | Source: Ambulatory Visit | Attending: Orthopedic Surgery | Admitting: Orthopedic Surgery

## 2013-06-02 DIAGNOSIS — M5126 Other intervertebral disc displacement, lumbar region: Secondary | ICD-10-CM | POA: Diagnosis not present

## 2013-06-02 DIAGNOSIS — M545 Low back pain, unspecified: Secondary | ICD-10-CM

## 2013-06-05 DIAGNOSIS — M5126 Other intervertebral disc displacement, lumbar region: Secondary | ICD-10-CM | POA: Diagnosis not present

## 2013-06-12 DIAGNOSIS — Z01419 Encounter for gynecological examination (general) (routine) without abnormal findings: Secondary | ICD-10-CM | POA: Insufficient documentation

## 2013-06-12 DIAGNOSIS — Z0000 Encounter for general adult medical examination without abnormal findings: Secondary | ICD-10-CM | POA: Insufficient documentation

## 2013-06-12 DIAGNOSIS — Z131 Encounter for screening for diabetes mellitus: Secondary | ICD-10-CM | POA: Insufficient documentation

## 2013-06-12 NOTE — Assessment & Plan Note (Signed)
Obtain glucose and HgbA1c.

## 2013-06-12 NOTE — Assessment & Plan Note (Signed)
Recurrent.  S/p albuterol nebulizer in office; advised to restart Symbicort bid; use Albuterol HFA every six hours PRN wheezing.

## 2013-06-12 NOTE — Assessment & Plan Note (Signed)
Pre-contemplative at this time.

## 2013-06-12 NOTE — Assessment & Plan Note (Signed)
Stable; no change in medications; encourage pt to establish with local neurologist.

## 2013-06-12 NOTE — Assessment & Plan Note (Signed)
New. Rapid strep test negative in office; supportive care with rest, fluids, Ibuprofen.

## 2013-06-12 NOTE — Assessment & Plan Note (Signed)
Completed; pap smear obtained; refer for mammogram.

## 2013-06-12 NOTE — Assessment & Plan Note (Signed)
Anticipatory guidance --- smoking cessation, exercise.  Pap smear obtained. Refer for mammogram. Hemosure obtained; counseled on colonoscopy for colon cancer screening; pt to consider.  Immunizations UTD.  Highly recommend establishing with local psychiatrist and neurologist; this office has referred pt to both specialties twice in past year.  Does not drive due to seizures.  Moderate fall risk. Independent with all other ADLs. No hearing loss.

## 2013-06-12 NOTE — Assessment & Plan Note (Signed)
Stable; non-compliance with referral to psychiatry; encourage pt to establish with psychiatry in [place].  Emotionally stable at this time.

## 2013-07-02 ENCOUNTER — Ambulatory Visit: Payer: Medicare Other

## 2013-07-12 ENCOUNTER — Ambulatory Visit (INDEPENDENT_AMBULATORY_CARE_PROVIDER_SITE_OTHER): Payer: Medicare Other | Admitting: Internal Medicine

## 2013-07-12 VITALS — BP 153/90 | HR 87 | Temp 98.2°F | Resp 17 | Ht 65.0 in | Wt 114.0 lb

## 2013-07-12 DIAGNOSIS — M5430 Sciatica, unspecified side: Secondary | ICD-10-CM

## 2013-07-12 DIAGNOSIS — M545 Low back pain, unspecified: Secondary | ICD-10-CM

## 2013-07-12 DIAGNOSIS — M5431 Sciatica, right side: Secondary | ICD-10-CM

## 2013-07-12 DIAGNOSIS — M79604 Pain in right leg: Secondary | ICD-10-CM

## 2013-07-12 MED ORDER — METHOCARBAMOL 750 MG PO TABS: 750.0000 mg | ORAL_TABLET | Freq: Four times a day (QID) | ORAL | Status: DC

## 2013-07-12 MED ORDER — OXYCODONE-ACETAMINOPHEN 5-325 MG PO TABS: 1.0000 | ORAL_TABLET | Freq: Three times a day (TID) | ORAL | Status: DC | PRN

## 2013-07-12 NOTE — Patient Instructions (Signed)
Laminectomy - Laminotomy - Discectomy  Your surgeon has decided that a laminectomy (entire lamina removal) or laminotomy (partial lamina removal) is the best treatment for your back problem. These procedures involve removal of bone to relieve pressure on nerve roots. It allows the surgeon access to parts of the spine where other problems are located. This could be an injured disc (the cartilage-like structures located between the bones of the back). In this surgery your surgeon removes a part of the boney arch that surrounds your spinal canal. This may be compressing nerve roots. In some cases, the surgeon will remove the disc and fuse (stick together) vertebral bodies (the bones of your back) to make the spine more stable. The type of procedure you will need is usually decided prior to surgery, however modifications may be necessary. The time in surgery depends on the findings in surgery and the procedure necessary to correct the problems.  DISCECTOMY  For people with disc problems, the surgeon removes the portion of the disc that is causing the pressure on the nerve root. Some surgeons perform a micro (small) discectomy, which may require removal of only a small portion of the lamina. A disc nucleus (center) may also be removed either through a needle (percutaneous discectomy) or by injecting an enzyme called chymopapain into the disc. Chymopapain is an enzyme that dissolves the disc. For people with back instability, the surgeon fuses vertebrae that are next to each other with tiny pieces of bone. These are used as bone grafts on the facets, or between the vertebrae. When this heals, the bones will no longer be able to move. These bone chips are often taken from the pelvic bones. Bones and bone grafts grow into one unit, stabilizing the segments of the spinal column.  LET YOUR CAREGIVER KNOW ABOUT:  · Allergies.  · Medicines taken including herbs, eyedrops, over-the-counter medicines, and creams.  · Use of  steroids (by mouth or creams).  · Previous problems with anesthetics or numbing medicine.  · Possibility of pregnancy, if this applies.  · History of blood clots (thrombophlebitis).  · History of bleeding or blood problems.  · Previous surgery.  · Other health problems.  RISKS AND COMPLICATIONS  Your caregiver will discuss possible risks and complications with you before surgery. In addition to the usual risks of anesthesia, other common risks and complications include:  · Blood loss and replacement.  · Temporary increase in pain due to surgery.  · Uncorrected back pain.  · Infection.  · New nerve damage (tingling, numbness, and pain).  BEFORE THE PROCEDURE  · Stop smoking at least 1 week prior to surgery. This lowers risk during surgery.  · Your caregiver may advise that you stop taking certain medicines that may affect the outcome of the surgery and your ability to heal. For example, you may need to stop taking anti-inflammatories, such as aspirin, because of possible bleeding problems. Other medicines may have interactions with anesthesia.  · Tell your caregiver if you have been on steroids for long periods of time. Often, additional steroids are administered intravenously before and during the procedure to prevent complications.  · You should be present 60 minutes prior to your procedure or as directed.  AFTER THE PROCEDURE  After surgery, you will be taken to the recovery area where a nurse will watch and check your progress. Generally, you will be allowed to go home within 1 week barring other problems.  HOME CARE INSTRUCTIONS   · Check the surgical cut (  incision) twice a day for signs of infection. Some signs may include a bad smelling, greenish or yellowish discharge from the wound; increased pain or increased redness over the incision site; an opening of the incision; flu-like symptoms; or a temperature above 101.5° F (38.6° C).  · Change your bandages in about 24 to 36 hours following surgery or as  directed.  · You may shower once the bandage is removed or as directed. Avoid bathtubs, swimming pools, and hot tubs for 3 weeks or until your incision has healed completely. If you have stitches (sutures) or staples they may be removed 2 to 3 weeks after surgery, or as directed by your caregiver.  · Follow your caregiver's instructions for activities, exercises, and physical therapy.  · Weight reduction may be beneficial if you are overweight.  · Walking is permitted. You may use a treadmill without an incline. Cut down on activities if you have discomfort. You may also go up and down stairs as tolerated.  · Do not lift anything heavier than 10 to 15 pounds. Avoid bending or twisting at the waist. Always bend your knees.  · Maintain strength and range of motion as instructed.  · No driving is permitted for 2 to 3 weeks, or as directed by your caregiver. You may be a passenger for 20 to 30 minute trips. Lying back in the passenger seat may be more comfortable for you.  · Limit your sitting to 20 to 30 minute intervals. You should lie down or walk in between sitting periods. There are no limitations for sitting in a recliner chair.  · Only take over-the-counter or prescription medicines for pain, discomfort, or fever as directed by your caregiver.  SEEK MEDICAL CARE IF:   · There is increased bleeding (more than a small spot) from the wound.  · You notice redness, swelling, or increasing pain in the wound.  · Pus is coming from the wound.  · An unexplained oral temperature above 102° F (38.9° C) develops.  · You notice a bad smell coming from the wound or dressing.  SEEK IMMEDIATE MEDICAL CARE IF:   · You develop a rash.  · You have difficulty breathing.  · You have any allergic problems.  Document Released: 11/26/2000 Document Revised: 02/21/2012 Document Reviewed: 09/24/2008  ExitCare® Patient Information ©2014 [employer].

## 2013-07-12 NOTE — Progress Notes (Signed)
  Subjective:    Patient ID: Melissa Bridges, female    DOB: 1961-08-29, 52 y.o.   MRN: [id number]  HPI Has proven HNP with right sxs. Scheduled to see spine doc 1 week. No incontinence.   Review of Systems     Objective:   Physical Exam LS spine pain Greatly decreased DTR right knee.       Assessment & Plan:  LB pain with right HNP and function loss See othopedic spine doc 8/8 as scheduled RF meds

## 2013-07-18 ENCOUNTER — Ambulatory Visit
Admission: RE | Admit: 2013-07-18 | Discharge: 2013-07-18 | Disposition: A | Discharge status: Home / Self Care | Payer: Medicare Other | Source: Ambulatory Visit | Attending: Family Medicine | Admitting: Family Medicine

## 2013-07-18 DIAGNOSIS — Z1231 Encounter for screening mammogram for malignant neoplasm of breast: Secondary | ICD-10-CM | POA: Diagnosis not present

## 2013-07-19 ENCOUNTER — Other Ambulatory Visit: Payer: Self-pay | Admitting: Family Medicine

## 2013-07-19 DIAGNOSIS — R928 Other abnormal and inconclusive findings on diagnostic imaging of breast: Secondary | ICD-10-CM

## 2013-07-23 DIAGNOSIS — E785 Hyperlipidemia, unspecified: Secondary | ICD-10-CM | POA: Diagnosis not present

## 2013-07-23 DIAGNOSIS — J449 Chronic obstructive pulmonary disease, unspecified: Secondary | ICD-10-CM | POA: Diagnosis not present

## 2013-07-23 DIAGNOSIS — E782 Mixed hyperlipidemia: Secondary | ICD-10-CM | POA: Diagnosis not present

## 2013-07-23 DIAGNOSIS — H81399 Other peripheral vertigo, unspecified ear: Secondary | ICD-10-CM | POA: Diagnosis not present

## 2013-07-23 DIAGNOSIS — B182 Chronic viral hepatitis C: Secondary | ICD-10-CM | POA: Diagnosis not present

## 2013-07-23 DIAGNOSIS — I10 Essential (primary) hypertension: Secondary | ICD-10-CM | POA: Diagnosis not present

## 2013-07-23 DIAGNOSIS — B1920 Unspecified viral hepatitis C without hepatic coma: Secondary | ICD-10-CM | POA: Diagnosis not present

## 2013-07-23 DIAGNOSIS — B188 Other chronic viral hepatitis: Secondary | ICD-10-CM | POA: Diagnosis not present

## 2013-08-06 ENCOUNTER — Inpatient Hospital Stay: Admission: RE | Admit: 2013-08-06 | Payer: Medicare Other | Source: Ambulatory Visit

## 2013-08-07 DIAGNOSIS — B1920 Unspecified viral hepatitis C without hepatic coma: Secondary | ICD-10-CM | POA: Diagnosis not present

## 2013-08-07 DIAGNOSIS — R109 Unspecified abdominal pain: Secondary | ICD-10-CM | POA: Diagnosis not present

## 2013-08-07 DIAGNOSIS — B182 Chronic viral hepatitis C: Secondary | ICD-10-CM | POA: Diagnosis not present

## 2013-08-23 DIAGNOSIS — H81399 Other peripheral vertigo, unspecified ear: Secondary | ICD-10-CM | POA: Diagnosis not present

## 2013-08-23 DIAGNOSIS — M5126 Other intervertebral disc displacement, lumbar region: Secondary | ICD-10-CM | POA: Diagnosis not present

## 2013-08-23 DIAGNOSIS — M2559 Pain in other specified joint: Secondary | ICD-10-CM | POA: Diagnosis not present

## 2013-08-23 DIAGNOSIS — F209 Schizophrenia, unspecified: Secondary | ICD-10-CM | POA: Diagnosis not present

## 2013-09-25 DIAGNOSIS — J45909 Unspecified asthma, uncomplicated: Secondary | ICD-10-CM | POA: Diagnosis not present

## 2013-09-25 DIAGNOSIS — I10 Essential (primary) hypertension: Secondary | ICD-10-CM | POA: Diagnosis not present

## 2013-09-25 DIAGNOSIS — H81399 Other peripheral vertigo, unspecified ear: Secondary | ICD-10-CM | POA: Diagnosis not present

## 2013-09-25 DIAGNOSIS — M545 Low back pain, unspecified: Secondary | ICD-10-CM | POA: Diagnosis not present

## 2013-09-25 DIAGNOSIS — F209 Schizophrenia, unspecified: Secondary | ICD-10-CM | POA: Diagnosis not present

## 2013-09-25 DIAGNOSIS — M5126 Other intervertebral disc displacement, lumbar region: Secondary | ICD-10-CM | POA: Diagnosis not present

## 2013-09-25 DIAGNOSIS — F17200 Nicotine dependence, unspecified, uncomplicated: Secondary | ICD-10-CM | POA: Diagnosis not present

## 2013-10-30 DIAGNOSIS — M545 Low back pain, unspecified: Secondary | ICD-10-CM | POA: Diagnosis not present

## 2013-10-30 DIAGNOSIS — I10 Essential (primary) hypertension: Secondary | ICD-10-CM | POA: Diagnosis not present

## 2013-10-30 DIAGNOSIS — E785 Hyperlipidemia, unspecified: Secondary | ICD-10-CM | POA: Diagnosis not present

## 2013-10-30 DIAGNOSIS — F17200 Nicotine dependence, unspecified, uncomplicated: Secondary | ICD-10-CM | POA: Diagnosis not present

## 2013-11-29 DIAGNOSIS — M545 Low back pain, unspecified: Secondary | ICD-10-CM | POA: Diagnosis not present

## 2013-11-29 DIAGNOSIS — F17200 Nicotine dependence, unspecified, uncomplicated: Secondary | ICD-10-CM | POA: Diagnosis not present

## 2013-11-29 DIAGNOSIS — Z79899 Other long term (current) drug therapy: Secondary | ICD-10-CM | POA: Diagnosis not present

## 2013-11-29 DIAGNOSIS — I10 Essential (primary) hypertension: Secondary | ICD-10-CM | POA: Diagnosis not present

## 2013-11-29 DIAGNOSIS — G40909 Epilepsy, unspecified, not intractable, without status epilepticus: Secondary | ICD-10-CM | POA: Diagnosis not present

## 2013-11-29 DIAGNOSIS — F329 Major depressive disorder, single episode, unspecified: Secondary | ICD-10-CM | POA: Diagnosis not present

## 2014-01-03 DIAGNOSIS — J309 Allergic rhinitis, unspecified: Secondary | ICD-10-CM | POA: Diagnosis not present

## 2014-01-03 DIAGNOSIS — M545 Low back pain, unspecified: Secondary | ICD-10-CM | POA: Diagnosis not present

## 2014-01-03 DIAGNOSIS — R945 Abnormal results of liver function studies: Secondary | ICD-10-CM | POA: Diagnosis not present

## 2014-01-03 DIAGNOSIS — B182 Chronic viral hepatitis C: Secondary | ICD-10-CM | POA: Diagnosis not present

## 2014-01-04 DIAGNOSIS — Z79899 Other long term (current) drug therapy: Secondary | ICD-10-CM | POA: Diagnosis not present

## 2014-01-17 ENCOUNTER — Ambulatory Visit: Payer: Medicare Other

## 2014-01-17 ENCOUNTER — Ambulatory Visit (INDEPENDENT_AMBULATORY_CARE_PROVIDER_SITE_OTHER): Payer: Medicare Other | Admitting: Family Medicine

## 2014-01-17 VITALS — BP 136/76 | HR 83 | Temp 98.1°F | Resp 16 | Ht 64.0 in | Wt 123.0 lb

## 2014-01-17 DIAGNOSIS — M5430 Sciatica, unspecified side: Secondary | ICD-10-CM | POA: Diagnosis not present

## 2014-01-17 DIAGNOSIS — I10 Essential (primary) hypertension: Secondary | ICD-10-CM | POA: Diagnosis not present

## 2014-01-17 DIAGNOSIS — M545 Low back pain, unspecified: Secondary | ICD-10-CM

## 2014-01-17 DIAGNOSIS — D649 Anemia, unspecified: Secondary | ICD-10-CM | POA: Diagnosis not present

## 2014-01-17 DIAGNOSIS — M79604 Pain in right leg: Secondary | ICD-10-CM

## 2014-01-17 DIAGNOSIS — M5431 Sciatica, right side: Secondary | ICD-10-CM

## 2014-01-17 DIAGNOSIS — R928 Other abnormal and inconclusive findings on diagnostic imaging of breast: Secondary | ICD-10-CM | POA: Diagnosis not present

## 2014-01-17 LAB — POCT CBC
Granulocyte percent: 46.5 %G (ref 37–80)
HCT, POC: 39.4 % (ref 37.7–47.9)
HEMOGLOBIN: 12.1 g/dL — AB (ref 12.2–16.2)
LYMPH, POC: 2.6 (ref 0.6–3.4)
MCH: 31.8 pg — AB (ref 27–31.2)
MCHC: 30.7 g/dL — AB (ref 31.8–35.4)
MCV: 103.3 fL — AB (ref 80–97)
MID (cbc): 0.4 (ref 0–0.9)
MPV: 8.9 fL (ref 0–99.8)
POC Granulocyte: 2.6 (ref 2–6.9)
POC LYMPH PERCENT: 45.7 %L (ref 10–50)
POC MID %: 7.8 % (ref 0–12)
Platelet Count, POC: 190 10*3/uL (ref 142–424)
RBC: 3.81 M/uL — AB (ref 4.04–5.48)
RDW, POC: 15.6 %
WBC: 5.6 10*3/uL (ref 4.6–10.2)

## 2014-01-17 MED ORDER — AMLODIPINE BESYLATE 10 MG PO TABS: 10.0000 mg | ORAL_TABLET | Freq: Every day | ORAL | Status: DC

## 2014-01-17 MED ORDER — HYDROCODONE-ACETAMINOPHEN 5-325 MG PO TABS: 1.0000 | ORAL_TABLET | Freq: Four times a day (QID) | ORAL | Status: DC | PRN

## 2014-01-17 MED ORDER — METHOCARBAMOL 750 MG PO TABS: 750.0000 mg | ORAL_TABLET | Freq: Four times a day (QID) | ORAL | Status: DC

## 2014-01-17 NOTE — Patient Instructions (Signed)
Sciatica °Sciatica is pain, weakness, numbness, or tingling along the path of the sciatic nerve. The nerve starts in the lower back and runs down the back of each leg. The nerve controls the muscles in the lower leg and in the back of the knee, while also providing sensation to the back of the thigh, lower leg, and the sole of your foot. Sciatica is a symptom of another medical condition. For instance, nerve damage or certain conditions, such as a herniated disk or bone spur on the spine, pinch or put pressure on the sciatic nerve. This causes the pain, weakness, or other sensations normally associated with sciatica. Generally, sciatica only affects one side of the body. °CAUSES  °· Herniated or slipped disc. °· Degenerative disk disease. °· A pain disorder involving the narrow muscle in the buttocks (piriformis syndrome). °· Pelvic injury or fracture. °· Pregnancy. °· Tumor (rare). °SYMPTOMS  °Symptoms can vary from mild to very severe. The symptoms usually travel from the low back to the buttocks and down the back of the leg. Symptoms can include: °· Mild tingling or dull aches in the lower back, leg, or hip. °· Numbness in the back of the calf or sole of the foot. °· Burning sensations in the lower back, leg, or hip. °· Sharp pains in the lower back, leg, or hip. °· Leg weakness. °· Severe back pain inhibiting movement. °These symptoms may get worse with coughing, sneezing, laughing, or prolonged sitting or standing. Also, being overweight may worsen symptoms. °DIAGNOSIS  °Your caregiver will perform a physical exam to look for common symptoms of sciatica. He or she may ask you to do certain movements or activities that would trigger sciatic nerve pain. Other tests may be performed to find the cause of the sciatica. These may include: °· Blood tests. °· X-rays. °· Imaging tests, such as an MRI or CT scan. °TREATMENT  °Treatment is directed at the cause of the sciatic pain. Sometimes, treatment is not necessary  and the pain and discomfort goes away on its own. If treatment is needed, your caregiver may suggest: °· Over-the-counter medicines to relieve pain. °· Prescription medicines, such as anti-inflammatory medicine, muscle relaxants, or narcotics. °· Applying heat or ice to the painful area. °· Steroid injections to lessen pain, irritation, and inflammation around the nerve. °· Reducing activity during periods of pain. °· Exercising and stretching to strengthen your abdomen and improve flexibility of your spine. Your caregiver may suggest losing weight if the extra weight makes the back pain worse. °· Physical therapy. °· Surgery to eliminate what is pressing or pinching the nerve, such as a bone spur or part of a herniated disk. °HOME CARE INSTRUCTIONS  °· Only take over-the-counter or prescription medicines for pain or discomfort as directed by your caregiver. °· Apply ice to the affected area for 20 minutes, 3 4 times a day for the first 48 72 hours. Then try heat in the same way. °· Exercise, stretch, or perform your usual activities if these do not aggravate your pain. °· Attend physical therapy sessions as directed by your caregiver. °· Keep all follow-up appointments as directed by your caregiver. °· Do not wear high heels or shoes that do not provide proper support. °· Check your mattress to see if it is too soft. A firm mattress may lessen your pain and discomfort. °SEEK IMMEDIATE MEDICAL CARE IF:  °· You lose control of your bowel or bladder (incontinence). °· You have increasing weakness in the lower back,   pelvis, buttocks, or legs. °· You have redness or swelling of your back. °· You have a burning sensation when you urinate. °· You have pain that gets worse when you lie down or awakens you at night. °· Your pain is worse than you have experienced in the past. °· Your pain is lasting longer than 4 weeks. °· You are suddenly losing weight without reason. °MAKE SURE YOU: °· Understand these  instructions. °· Will watch your condition. °· Will get help right away if you are not doing well or get worse. °Document Released: 11/23/2001 Document Revised: 05/30/2012 Document Reviewed: 04/09/2012 °ExitCare® Patient Information ©2014 [employer]. ° °

## 2014-01-17 NOTE — Progress Notes (Signed)
Patient ID: Ryliee Figge, female   DOB: 31-May-1961, 53 y.o.   MRN: [id number]   Subjective:    Patient ID: Wenda Low, female    DOB: 1961/02/04, 53 y.o.   MRN: [id number]  01/17/2014   HPI   Pt presents today with right side sciatica radiating to mid calf for over a week Normal BM's, no urinary sxs, no tingling/ numbness/weakness in lower extremities.  Has a h/o of this in past. Has tried Tylenol for this episode.  Normally flares up on rainy days, long car rides and excessive movements. Flared up this time after cleaning her house. Travels a lot to visit family around [place]. Has been to the ER twice for sciatica. Went to an urgent care in [place] to have her meds refilled. Has not seen a psychiatrist since we saw her last.  Had anemia at her last OV with [place].  Since then has been taking iron supplements. Denies bloody stools or black stools; denies n/v/d/c; denies abdominal pain. Needs refill of blood pressure medications. Patient never went for additional mammogram views as requested by radiologist.   Review of Systems  Constitutional: Negative for fever, chills, diaphoresis and fatigue.  Respiratory: Negative for shortness of breath, wheezing and stridor.   Cardiovascular: Negative for chest pain, palpitations and leg swelling.  Gastrointestinal: Negative for nausea, vomiting, abdominal pain, diarrhea, constipation and blood in stool.  Musculoskeletal: Positive for back pain.  Skin: Negative for rash.  Neurological: Negative for weakness and numbness.    Past Medical History  Diagnosis Date  . Sciatic pain   . Schizoaffective disorder   . Hepatitis C   . Arthritis   . Asthma   . Migraine   . Allergy   . Depression   . Anxiety   . Alopecia   . Substance abuse   . Hypertension     onset age 40.  . Seizures     onset in childhood.  Generalized tonic-clonic.     Allergies  Allergen Reactions  . Ibuprofen Other (See Comments)    Stomach upset; However, pt can take  Meloxicam without incident and takes this at home.   . Aspirin Rash and Other (See Comments)    Stomach upset   Current Outpatient Prescriptions  Medication Sig Dispense Refill  . albuterol (PROVENTIL HFA;VENTOLIN HFA) 108 (90 BASE) MCG/ACT inhaler Inhale 2 puffs into the lungs every 4 (four) hours as needed for shortness of breath. 1 Inhaler 11  . amLODipine (NORVASC) 5 MG tablet Take 1 tablet (5 mg total) by mouth daily. High blood pressure    . beclomethasone (QVAR) 40 MCG/ACT inhaler Inhale 2 puffs into the lungs 2 (two) times daily. Asthma 1 Inhaler 12  . budesonide-formoterol (SYMBICORT) 80-4.5 MCG/ACT inhaler Inhale 2 puffs into the lungs daily as needed (for shortness of breath). 1 Inhaler 12  . citalopram (CELEXA) 10 MG tablet Take 1 tablet (10 mg total) by mouth daily. For depression    . divalproex (DEPAKOTE ER) 500 MG 24 hr tablet Take 2 tablets (1,000 mg total) by mouth daily. For mood stabilization    . HYDROcodone-acetaminophen (NORCO/VICODIN) 5-325 MG per tablet Take 1 tablet by mouth every 6 (six) hours as needed for moderate pain. 30 tablet 0  . meloxicam (MOBIC) 15 MG tablet Take 1 tablet (15 mg total) by mouth daily. For arthritis 30 tablet 1  . QUEtiapine (SEROQUEL) 300 MG tablet Take 1 tablet (300 mg total) by mouth at bedtime. Mood control     No  current facility-administered medications for this visit.       Objective:    BP 136/76 mmHg  Pulse 83  Temp(Src) 98.1 F (36.7 C) (Oral)  Resp 16  Ht 5\' 4"  (1.626 m)  Wt 123 lb (55.792 kg)  BMI 21.10 kg/m2  SpO2 99% Physical Exam  Constitutional: She is oriented to person, place, and time. She appears well-developed and well-nourished. No distress.  HENT:  Head: Normocephalic and atraumatic.  Right Ear: External ear normal.  Left Ear: External ear normal.  Nose: Nose normal.  Mouth/Throat: Oropharynx is clear and moist.  Eyes: Conjunctivae and EOM are normal. Pupils are equal, round, and reactive to light.    Neck: Normal range of motion. Neck supple. Carotid bruit is not present. No thyromegaly present.  Cardiovascular: Normal rate, regular rhythm, normal heart sounds and intact distal pulses.  Exam reveals no gallop and no friction rub.   No murmur heard. Pulmonary/Chest: Effort normal and breath sounds normal. She has no wheezes. She has no rales.  Abdominal: Soft. Bowel sounds are normal. She exhibits no distension and no mass. There is no tenderness. There is no rebound and no guarding.  Musculoskeletal:       Lumbar back: She exhibits pain. She exhibits normal range of motion, no tenderness and no bony tenderness.  Lumbar spine:  Straight leg raises equivocal; toe and heel walking intact.  Marching intact.  Pain with flexion of back; otherwise, full ROM.  Motor 5/5 BLE.  Lymphadenopathy:    She has no cervical adenopathy.  Neurological: She is alert and oriented to person, place, and time. She has normal strength. No cranial nerve deficit or sensory deficit.  Reflex Scores:      Patellar reflexes are 2+ on the right side and 2+ on the left side.      Achilles reflexes are 2+ on the right side and 2+ on the left side. Skin: Skin is warm and dry. No rash noted. She is not diaphoretic. No erythema. No pallor.  Psychiatric: She has a normal mood and affect. Her behavior is normal.       Results for orders placed or performed in visit on 01/17/14  Comprehensive metabolic panel  Result Value Ref Range   Sodium 138 135 - 145 mEq/L   Potassium 4.1 3.5 - 5.3 mEq/L   Chloride 103 96 - 112 mEq/L   CO2 23 19 - 32 mEq/L   Glucose, Bld 80 70 - 99 mg/dL   BUN 12 6 - 23 mg/dL   Creat 0.85 0.50 - 1.10 mg/dL   Total Bilirubin 0.5 0.2 - 1.2 mg/dL   Alkaline Phosphatase 102 39 - 117 U/L   AST 148 (H) 0 - 37 U/L   ALT 81 (H) 0 - 35 U/L   Total Protein 8.5 (H) 6.0 - 8.3 g/dL   Albumin 4.6 3.5 - 5.2 g/dL   Calcium 10.3 8.4 - 10.5 mg/dL  POCT CBC  Result Value Ref Range   WBC 5.6 4.6 - 10.2 K/uL    Lymph, poc 2.6 0.6 - 3.4   POC LYMPH PERCENT 45.7 10 - 50 %L   MID (cbc) 0.4 0 - 0.9   POC MID % 7.8 0 - 12 %M   POC Granulocyte 2.6 2 - 6.9   Granulocyte percent 46.5 37 - 80 %G   RBC 3.81 (A) 4.04 - 5.48 M/uL   Hemoglobin 12.1 (A) 12.2 - 16.2 g/dL   HCT, POC 39.4 37.7 - 47.9 %  MCV 103.3 (A) 80 - 97 fL   MCH, POC 31.8 (A) 27 - 31.2 pg   MCHC 30.7 (A) 31.8 - 35.4 g/dL   RDW, POC 15.6 %   Platelet Count, POC 190 142 - 424 K/uL   MPV 8.9 0 - 99.8 fL   [facility] reading (PRIMARY) by  Dr. Tamala Julian.  LUMBAR SPINE FILMS:  SPURRING MULTILEVEL; NO NARROWING.   Assessment & Plan:  Lower back pain - Plan: DG Lumbar Spine Complete, DISCONTINUED: HYDROcodone-acetaminophen (NORCO/VICODIN) 5-325 MG per tablet  Sciatica of right side - Plan: DG Lumbar Spine Complete  Anemia, unspecified - Plan: POCT CBC  Essential hypertension, benign - Plan: Comprehensive metabolic panel  LBP radiating to right leg - Plan: DISCONTINUED: methocarbamol (ROBAXIN-750) 750 MG tablet  Hypertension - Plan: DISCONTINUED: amLODipine (NORVASC) 10 MG tablet  Mammogram abnormal - Plan: CANCELED: MM Digital Diagnostic Unilat R, CANCELED: US BREAST COMPLETE UNI RIGHT INC AXILLA   1. Lower back pain/R sciatica: recurrent; rx for Robaxin, Hydrocodone provided.  S/p ortho evaluation in past; if no improvement with current treatment, recommend follow-up with ortho; home exercise program provided to perform daily. 2.  Anemia: stable and improved from 04/2013; pt non-compliant with follow-up for repeat labs after 04/2013; pt refuses colonoscopy.  Compliance with iron supplementation; pt denies GI symptoms. 3.  HTN: controlled; obtain labs; refill of Amlodipine provided; follow-up in six months. 4.  Mammogram abnormal: non-compliance with further mammogram views; agreeable to undergoing further studies now that pt has returned to [place].   Meds ordered this encounter  Medications  . DISCONTD: methocarbamol (ROBAXIN-750) 750 MG tablet      Sig: Take 1 tablet (750 mg total) by mouth 4 (four) times daily.    Dispense:  40 tablet    Refill:  1  . DISCONTD: HYDROcodone-acetaminophen (NORCO/VICODIN) 5-325 MG per tablet    Sig: Take 1 tablet by mouth every 6 (six) hours as needed.    Dispense:  30 tablet    Refill:  0  . DISCONTD: amLODipine (NORVASC) 10 MG tablet    Sig: Take 1 tablet (10 mg total) by mouth daily.    Dispense:  30 tablet    Refill:  5    Return in about 3 months (around 04/16/2014), or if symptoms worsen or fail to improve.    Reginia Forts, M.D.  [facility] [address] [place], [place]  [address] [phone] phone [phone] fax

## 2014-01-18 LAB — COMPREHENSIVE METABOLIC PANEL
ALT: 81 U/L — ABNORMAL HIGH (ref 0–35)
AST: 148 U/L — ABNORMAL HIGH (ref 0–37)
Albumin: 4.6 g/dL (ref 3.5–5.2)
Alkaline Phosphatase: 102 U/L (ref 39–117)
BILIRUBIN TOTAL: 0.5 mg/dL (ref 0.2–1.2)
BUN: 12 mg/dL (ref 6–23)
CALCIUM: 10.3 mg/dL (ref 8.4–10.5)
CHLORIDE: 103 meq/L (ref 96–112)
CO2: 23 meq/L (ref 19–32)
CREATININE: 0.85 mg/dL (ref 0.50–1.10)
GLUCOSE: 80 mg/dL (ref 70–99)
Potassium: 4.1 mEq/L (ref 3.5–5.3)
Sodium: 138 mEq/L (ref 135–145)
Total Protein: 8.5 g/dL — ABNORMAL HIGH (ref 6.0–8.3)

## 2014-05-12 ENCOUNTER — Ambulatory Visit (INDEPENDENT_AMBULATORY_CARE_PROVIDER_SITE_OTHER): Payer: Medicare Other | Admitting: Family Medicine

## 2014-05-12 VITALS — BP 110/80 | HR 79 | Temp 98.3°F | Ht 63.0 in | Wt 118.2 lb

## 2014-05-12 DIAGNOSIS — M545 Low back pain, unspecified: Secondary | ICD-10-CM

## 2014-05-12 DIAGNOSIS — I10 Essential (primary) hypertension: Secondary | ICD-10-CM

## 2014-05-12 DIAGNOSIS — M79604 Pain in right leg: Secondary | ICD-10-CM

## 2014-05-12 DIAGNOSIS — M5431 Sciatica, right side: Secondary | ICD-10-CM

## 2014-05-12 DIAGNOSIS — D649 Anemia, unspecified: Secondary | ICD-10-CM

## 2014-05-12 DIAGNOSIS — M5430 Sciatica, unspecified side: Secondary | ICD-10-CM

## 2014-05-12 DIAGNOSIS — R928 Other abnormal and inconclusive findings on diagnostic imaging of breast: Secondary | ICD-10-CM

## 2014-05-12 DIAGNOSIS — J45901 Unspecified asthma with (acute) exacerbation: Secondary | ICD-10-CM | POA: Diagnosis not present

## 2014-05-12 LAB — POCT URINALYSIS DIPSTICK
Bilirubin, UA: NEGATIVE
GLUCOSE UA: NEGATIVE
Ketones, UA: NEGATIVE
Leukocytes, UA: NEGATIVE
NITRITE UA: NEGATIVE
PROTEIN UA: NEGATIVE
Spec Grav, UA: 1.015
UROBILINOGEN UA: 0.2
pH, UA: 6

## 2014-05-12 LAB — POCT CBC
GRANULOCYTE PERCENT: 65.6 % (ref 37–80)
HEMATOCRIT: 38.4 % (ref 37.7–47.9)
HEMOGLOBIN: 12.1 g/dL — AB (ref 12.2–16.2)
Lymph, poc: 1.7 (ref 0.6–3.4)
MCH, POC: 30.9 pg (ref 27–31.2)
MCHC: 31.5 g/dL — AB (ref 31.8–35.4)
MCV: 98 fL — AB (ref 80–97)
MID (cbc): 0.6 (ref 0–0.9)
MPV: 9.2 fL (ref 0–99.8)
POC GRANULOCYTE: 4.3 (ref 2–6.9)
POC LYMPH %: 25.5 % (ref 10–50)
POC MID %: 8.9 %M (ref 0–12)
Platelet Count, POC: 212 10*3/uL (ref 142–424)
RBC: 3.92 M/uL — AB (ref 4.04–5.48)
RDW, POC: 16.5 %
WBC: 6.6 10*3/uL (ref 4.6–10.2)

## 2014-05-12 LAB — COMPREHENSIVE METABOLIC PANEL
ALBUMIN: 4.6 g/dL (ref 3.5–5.2)
ALT: 67 U/L — ABNORMAL HIGH (ref 0–35)
AST: 94 U/L — ABNORMAL HIGH (ref 0–37)
Alkaline Phosphatase: 116 U/L (ref 39–117)
BUN: 10 mg/dL (ref 6–23)
CALCIUM: 9.6 mg/dL (ref 8.4–10.5)
CHLORIDE: 101 meq/L (ref 96–112)
CO2: 29 meq/L (ref 19–32)
Creat: 0.8 mg/dL (ref 0.50–1.10)
GLUCOSE: 94 mg/dL (ref 70–99)
POTASSIUM: 4.1 meq/L (ref 3.5–5.3)
SODIUM: 137 meq/L (ref 135–145)
TOTAL PROTEIN: 8.1 g/dL (ref 6.0–8.3)
Total Bilirubin: 0.4 mg/dL (ref 0.2–1.2)

## 2014-05-12 LAB — TSH: TSH: 0.992 u[IU]/mL (ref 0.350–4.500)

## 2014-05-12 MED ORDER — BUDESONIDE-FORMOTEROL FUMARATE 80-4.5 MCG/ACT IN AERO: 2.0000 | INHALATION_SPRAY | Freq: Two times a day (BID) | RESPIRATORY_TRACT | Status: DC

## 2014-05-12 MED ORDER — METHOCARBAMOL 750 MG PO TABS: 750.0000 mg | ORAL_TABLET | Freq: Four times a day (QID) | ORAL | Status: DC

## 2014-05-12 MED ORDER — MELOXICAM 15 MG PO TABS: 15.0000 mg | ORAL_TABLET | Freq: Every day | ORAL | Status: DC

## 2014-05-12 MED ORDER — AMLODIPINE BESYLATE 5 MG PO TABS: 5.0000 mg | ORAL_TABLET | Freq: Every day | ORAL | Status: DC

## 2014-05-12 NOTE — Progress Notes (Signed)
Subjective:    Patient ID: Melissa Bridges, female    DOB: Jul 04, 1961, 53 y.o.   MRN: [id number]  05/12/2014  Tailbone Pain   HPI This 53 y.o. female presents for evaluation of tailbone pain. Onset one month with persistent pain for past week. No injury. Stays very busy.  Increased cleaning of the house; big house.  Cleans all the time.  R lower back pain radiating into R leg.  Tingling in R hip. No numbness.  No weakness.  Prolonged standing or sitting makes worse.  Took Aleve and Tylenol to help with pain.  No b/b dysfunction.  Excessive urination chronic; drinks a lot of soda.  Eating ice which is chronic issue for patient.  Applied heating pad to lower back with improvement.    Just returned from [place] in past month; was staying with boyfriend and parents.  Now has returned to live with daughter.  Non-compliant with medications. No BP medication in one week.  Has not established with psychiatrist locally.    Never went for additional mammogram images after 04/2013 mammogram.  Requesting refill of Symbicort.  Smoking less; smoking 1 pack every three days.   Review of Systems  Constitutional: Negative for fever, chills, diaphoresis and fatigue.  Respiratory: Negative for cough, shortness of breath, wheezing and stridor.   Cardiovascular: Negative for chest pain, palpitations and leg swelling.  Genitourinary: Negative for difficulty urinating.  Musculoskeletal: Positive for back pain. Negative for arthralgias, gait problem and joint swelling.  Neurological: Negative for weakness and numbness.    Past Medical History  Diagnosis Date  . Sciatic pain   . Schizoaffective disorder   . Hepatitis C   . Arthritis   . Asthma   . Migraine   . Allergy   . Depression   . Anxiety   . Alopecia   . Substance abuse   . Hypertension     onset age 36.  . Seizures     onset in childhood.  Generalized tonic-clonic.     Past Surgical History  Procedure Laterality Date  . Dilation  and curettage of uterus    . Exploratory laparotomy    . Ovarian cyst removal  2008  . Abdominal hysterectomy      ovarian cyst B, cervical dysplasia, fibroids.   Ovaries intact.  [facility] admissions      multiple    Allergies  Allergen Reactions  . Ibuprofen Other (See Comments)    Stomach upset  . Aspirin Rash and Other (See Comments)    Stomach upset   Current Outpatient Prescriptions  Medication Sig Dispense Refill  . albuterol (PROVENTIL HFA;VENTOLIN HFA) 108 (90 BASE) MCG/ACT inhaler Inhale 2 puffs into the lungs every 4 (four) hours as needed.  1 Inhaler  11  . amLODipine (NORVASC) 5 MG tablet Take 1 tablet (5 mg total) by mouth daily.  30 tablet  5  . budesonide-formoterol (SYMBICORT) 80-4.5 MCG/ACT inhaler Inhale 2 puffs into the lungs 2 (two) times daily.  1 Inhaler  11  . citalopram (CELEXA) 10 MG tablet Take 1 tablet (10 mg total) by mouth daily.  30 tablet  1  . divalproex (DEPAKOTE ER) 500 MG 24 hr tablet Take 2 tablets (1,000 mg total) by mouth daily.  30 tablet  1  . meloxicam (MOBIC) 15 MG tablet Take 1 tablet (15 mg total) by mouth daily.  30 tablet  1  . methocarbamol (ROBAXIN-750) 750 MG tablet Take 1 tablet (750 mg total) by mouth  4 (four) times daily.  40 tablet  1  . oxyCODONE-acetaminophen (PERCOCET/ROXICET) 5-325 MG per tablet Take 1 tablet by mouth every 8 (eight) hours as needed for pain.  30 tablet  0  . QUEtiapine (SEROQUEL) 300 MG tablet Take 1 tablet (300 mg total) by mouth 2 (two) times daily.  60 tablet  1   No current facility-administered medications for this visit.   History   Social History  . Marital Status: Widowed    Spouse Name: N/A    Number of Children: N/A  . Years of Education: N/A   Occupational History  . Not on file.   Social History Main Topics  . Smoking status: Current Every Day Smoker -- 0.50 packs/day for 44 years    Types: Cigarettes  . Smokeless tobacco: Not on file  . Alcohol Use: 0.0 oz/week  . Drug Use:  No  . Sexual Activity: Yes    Birth Control/ Protection: None     Comment: widow   Other Topics Concern  . Not on file   Social History Narrative   Marital status:  Widowed since 2002.  Married x 16 years.  Not dating.  Moved from [place] to live with daughter in 2013.     Children:  One child/daughter (33); two grandchildren.      Lives: with daughter, grandchildren 2.  Does not drive due to epilepsy.      Employment:  Disability for schizoaffective disorder.      Tobacco: 1 ppd x since 8th grade.      Alcohol:  Social; rare drinking due to seizure medications.       Drugs: none; previous use of marijuana.  Previous iv drug use, cocaine.      Exercise: none      Seatbelt:  100%      Guns: none       Objective:    BP 110/80  Pulse 79  Temp(Src) 98.3 F (36.8 C) (Oral)  Ht 5\' 3"  (1.6 m)  Wt 118 lb 3.2 oz (53.615 kg)  BMI 20.94 kg/m2  SpO2 98% Physical Exam  Nursing note and vitals reviewed. Constitutional: She is oriented to person, place, and time. She appears well-developed and well-nourished. No distress.  HENT:  Head: Normocephalic and atraumatic.  Eyes: Conjunctivae are normal. Pupils are equal, round, and reactive to light.  Neck: Normal range of motion. Neck supple.  Cardiovascular: Normal rate, regular rhythm and normal heart sounds.  Exam reveals no gallop and no friction rub.   No murmur heard. Pulmonary/Chest: Effort normal and breath sounds normal. She has no wheezes. She has no rales.  Musculoskeletal:       Lumbar back: She exhibits pain. She exhibits normal range of motion, no tenderness, no bony tenderness and no spasm.  Lumbar spine:  Non-tender. Straight leg raises negative; toe and heel walking intact; motor 4/5 RLE; 5/5 LLE.  Marching intact.  Full ROM lumbar spine.    Neurological: She is alert and oriented to person, place, and time.  Skin: She is not diaphoretic.  Psychiatric: She has a normal mood and affect. Her behavior is normal.    Results for orders placed in visit on 05/12/14  POCT CBC      Result Value Ref Range   WBC 6.6  4.6 - 10.2 K/uL   Lymph, poc 1.7  0.6 - 3.4   POC LYMPH PERCENT 25.5  10 - 50 %L   MID (cbc) 0.6  0 - 0.9  POC MID % 8.9  0 - 12 %M   POC Granulocyte 4.3  2 - 6.9   Granulocyte percent 65.6  37 - 80 %G   RBC 3.92 (*) 4.04 - 5.48 M/uL   Hemoglobin 12.1 (*) 12.2 - 16.2 g/dL   HCT, POC 38.4  37.7 - 47.9 %   MCV 98.0 (*) 80 - 97 fL   MCH, POC 30.9  27 - 31.2 pg   MCHC 31.5 (*) 31.8 - 35.4 g/dL   RDW, POC 16.5     Platelet Count, POC 212  142 - 424 K/uL   MPV 9.2  0 - 99.8 fL  POCT URINALYSIS DIPSTICK      Result Value Ref Range   Color, UA yellow     Clarity, UA clear     Glucose, UA neg     Bilirubin, UA neg     Ketones, UA neg     Spec Grav, UA 1.015     Blood, UA small     pH, UA 6.0     Protein, UA neg     Urobilinogen, UA 0.2     Nitrite, UA neg     Leukocytes, UA Negative         Assessment & Plan:  Sciatica of right side  Hypertension - Plan: Comprehensive metabolic panel, POCT urinalysis dipstick, TSH, amLODipine (NORVASC) 5 MG tablet  Anemia - Plan: POCT CBC  LBP radiating to right leg - Plan: methocarbamol (ROBAXIN-750) 750 MG tablet  Lower back pain - Plan: meloxicam (MOBIC) 15 MG tablet  Asthma with acute exacerbation - Plan: budesonide-formoterol (SYMBICORT) 80-4.5 MCG/ACT inhaler  Mammogram abnormal  1.  R sciatica: recurrent; rx for Mobic and Robaxin provided; home exercise program provided to perform daily.  Recommend follow-up with ortho if persistent. 2.  HTN: elevated today due to non-compliance with medication; rx for Amlodipine 5mg  daily provided. 3.  Anemia: improved/stable.   4.  Asthma:  Controlled; refill of Symbicort provided; recommend smoking cessation. 5.  Abnormal mammogram R:patient non-compliant with additional views last year; refer for diagnostic mammogram and R breast US.   Meds ordered this encounter  Medications  .  methocarbamol (ROBAXIN-750) 750 MG tablet    Sig: Take 1 tablet (750 mg total) by mouth 4 (four) times daily.    Dispense:  40 tablet    Refill:  1  . meloxicam (MOBIC) 15 MG tablet    Sig: Take 1 tablet (15 mg total) by mouth daily.    Dispense:  30 tablet    Refill:  1  . amLODipine (NORVASC) 5 MG tablet    Sig: Take 1 tablet (5 mg total) by mouth daily.    Dispense:  30 tablet    Refill:  5  . budesonide-formoterol (SYMBICORT) 80-4.5 MCG/ACT inhaler    Sig: Inhale 2 puffs into the lungs 2 (two) times daily.    Dispense:  1 Inhaler    Refill:  11    No Follow-up on file.  Reginia Forts, M.D.  [facility] [address] [place], [place]  [address] [phone] phone [phone] fax

## 2014-05-12 NOTE — Patient Instructions (Signed)

## 2014-05-22 ENCOUNTER — Ambulatory Visit (INDEPENDENT_AMBULATORY_CARE_PROVIDER_SITE_OTHER): Payer: Medicare Other | Admitting: Family Medicine

## 2014-05-22 ENCOUNTER — Encounter: Payer: Self-pay | Admitting: Family Medicine

## 2014-05-22 ENCOUNTER — Ambulatory Visit (INDEPENDENT_AMBULATORY_CARE_PROVIDER_SITE_OTHER): Payer: Medicare Other

## 2014-05-22 VITALS — BP 162/89 | HR 80 | Temp 97.5°F | Resp 18 | Ht 63.5 in | Wt 117.0 lb

## 2014-05-22 DIAGNOSIS — M5430 Sciatica, unspecified side: Secondary | ICD-10-CM | POA: Diagnosis not present

## 2014-05-22 DIAGNOSIS — M545 Low back pain, unspecified: Secondary | ICD-10-CM

## 2014-05-22 DIAGNOSIS — M5431 Sciatica, right side: Secondary | ICD-10-CM

## 2014-05-22 MED ORDER — HYDROCODONE-ACETAMINOPHEN 5-325 MG PO TABS: 1.0000 | ORAL_TABLET | Freq: Four times a day (QID) | ORAL | Status: DC | PRN

## 2014-05-22 NOTE — Progress Notes (Signed)
Subjective:  This chart was scribed for Wardell Honour, MD by Ladene Artist, ED Scribe. The patient was seen in room 28. Patient's care was started at 12:19 PM.   Patient ID: Melissa Bridges, female    DOB: 10-18-1961, 53 y.o.   MRN: [id number]  05/22/2014  Back Pain   HPI HPI Comments: Melissa Bridges is a 53 y.o. female who presents to the [facility] complaining of constant back pain that radiates down R leg. Pt was seen 10 days ago for lower back pain. Pt was diagnosed with recurrent R sciatica and was started on Mobic and Robaxin. Also refilled pt's BP medication. Pt states that the back pain eased but returned after traveling to [place], [place] for a family member's death. Pt left last 04-06-2023 and returned Tuesday. She reports associated numbness in her R toes. She denies numbness in her groin; denies saddle paresthesias. She states that she is taking 3 muscle relaxers daily. She also states that she has been taking 3 tablets of Acetaminophen 650 mg x 3 daily for the past month.  Denies b/b dysfunction.  Patient is scheduled for mammogram in upcoming 2 weeks.  Review of Systems  Constitutional: Negative for fever, chills, diaphoresis and fatigue.  Genitourinary: Negative for decreased urine volume and difficulty urinating.  Musculoskeletal: Positive for back pain and myalgias. Negative for gait problem.  Skin: Negative for rash.  Neurological: Positive for numbness. Negative for weakness.    Past Medical History  Diagnosis Date  . Sciatic pain   . Schizoaffective disorder   . Hepatitis C   . Arthritis   . Asthma   . Migraine   . Allergy   . Depression   . Anxiety   . Alopecia   . Substance abuse   . Hypertension     onset age 20.  . Seizures     onset in childhood.  Generalized tonic-clonic.     Past Surgical History  Procedure Laterality Date  . Dilation and curettage of uterus    . Exploratory laparotomy    . Ovarian cyst removal  2008  .  Abdominal hysterectomy      ovarian cyst B, cervical dysplasia, fibroids.   Ovaries intact.  [facility] admissions      multiple    Allergies  Allergen Reactions  . Ibuprofen Other (See Comments)    Stomach upset  . Aspirin Rash and Other (See Comments)    Stomach upset   Current Outpatient Prescriptions  Medication Sig Dispense Refill  . acetaminophen (TYLENOL) 650 MG CR tablet Take 650 mg by mouth every 8 (eight) hours as needed for pain.      [name] albuterol (PROVENTIL HFA;VENTOLIN HFA) 108 (90 BASE) MCG/ACT inhaler Inhale 2 puffs into the lungs every 4 (four) hours as needed.  1 Inhaler  11  . amLODipine (NORVASC) 5 MG tablet Take 1 tablet (5 mg total) by mouth daily.  30 tablet  5  . budesonide-formoterol (SYMBICORT) 80-4.5 MCG/ACT inhaler Inhale 2 puffs into the lungs 2 (two) times daily.  1 Inhaler  11  . meloxicam (MOBIC) 15 MG tablet Take 1 tablet (15 mg total) by mouth daily.  30 tablet  1  . methocarbamol (ROBAXIN-750) 750 MG tablet Take 1 tablet (750 mg total) by mouth 4 (four) times daily.  40 tablet  1  . QUEtiapine (SEROQUEL) 300 MG tablet Take 1 tablet (300 mg total) by mouth 2 (two) times daily.  60 tablet  1  . citalopram (CELEXA) 10 MG tablet Take 1 tablet (10 mg total) by mouth daily.  30 tablet  1  . divalproex (DEPAKOTE ER) 500 MG 24 hr tablet Take 2 tablets (1,000 mg total) by mouth daily.  30 tablet  1  . HYDROcodone-acetaminophen (NORCO/VICODIN) 5-325 MG per tablet Take 1 tablet by mouth every 6 (six) hours as needed for moderate pain.  30 tablet  0   No current facility-administered medications for this visit.       Objective:    Triage Vitals: BP 162/89  Pulse 80  Temp(Src) 97.5 F (36.4 C) (Oral)  Resp 18  Ht 5' 3.5" (1.613 m)  Wt 117 lb (53.071 kg)  BMI 20.40 kg/m2  SpO2 96% Physical Exam  Nursing note and vitals reviewed. Constitutional: She is oriented to person, place, and time. She appears well-developed and well-nourished.  HENT:    Head: Normocephalic and atraumatic.  Eyes: Conjunctivae and EOM are normal.  Neck: Normal range of motion.  Cardiovascular: Normal rate, regular rhythm and normal heart sounds.   No murmur heard. Pulmonary/Chest: Effort normal and breath sounds normal. She has no wheezes. She has no rales.  Musculoskeletal:       Lumbar back: She exhibits tenderness and pain. She exhibits normal range of motion, no bony tenderness and no spasm.  Lumbar spine: no midline TTP; +TTP R paraspinal muscle region; straight leg raise equivocal.  Full ROM with flexion/extension/rotation/lateral bending.  Marching difficult for patient; toe and heel walking intact.  Normal gait.  Motor 5/5 BLE.    Neurological: She is alert and oriented to person, place, and time.  Reflex Scores:      Patellar reflexes are 0 on the right side and 2+ on the left side. Straight leg raise was equivocal   Skin: Skin is warm and dry.  Psychiatric: She has a normal mood and affect. Her behavior is normal.   Results for orders placed in visit on 05/12/14  COMPREHENSIVE METABOLIC PANEL      Result Value Ref Range   Sodium 137  135 - 145 mEq/L   Potassium 4.1  3.5 - 5.3 mEq/L   Chloride 101  96 - 112 mEq/L   CO2 29  19 - 32 mEq/L   Glucose, Bld 94  70 - 99 mg/dL   BUN 10  6 - 23 mg/dL   Creat 0.80  0.50 - 1.10 mg/dL   Total Bilirubin 0.4  0.2 - 1.2 mg/dL   Alkaline Phosphatase 116  39 - 117 U/L   AST 94 (*) 0 - 37 U/L   ALT 67 (*) 0 - 35 U/L   Total Protein 8.1  6.0 - 8.3 g/dL   Albumin 4.6  3.5 - 5.2 g/dL   Calcium 9.6  8.4 - 10.5 mg/dL  TSH      Result Value Ref Range   TSH 0.992  0.350 - 4.500 uIU/mL  POCT CBC      Result Value Ref Range   WBC 6.6  4.6 - 10.2 K/uL   Lymph, poc 1.7  0.6 - 3.4   POC LYMPH PERCENT 25.5  10 - 50 %L   MID (cbc) 0.6  0 - 0.9   POC MID % 8.9  0 - 12 %M   POC Granulocyte 4.3  2 - 6.9   Granulocyte percent 65.6  37 - 80 %G   RBC 3.92 (*) 4.04 - 5.48 M/uL   Hemoglobin 12.1 (*) 12.2 - 16.2 g/dL  HCT, POC 38.4  37.7 - 47.9 %   MCV 98.0 (*) 80 - 97 fL   MCH, POC 30.9  27 - 31.2 pg   MCHC 31.5 (*) 31.8 - 35.4 g/dL   RDW, POC 16.5     Platelet Count, POC 212  142 - 424 K/uL   MPV 9.2  0 - 99.8 fL  POCT URINALYSIS DIPSTICK      Result Value Ref Range   Color, UA yellow     Clarity, UA clear     Glucose, UA neg     Bilirubin, UA neg     Ketones, UA neg     Spec Grav, UA 1.015     Blood, UA small     pH, UA 6.0     Protein, UA neg     Urobilinogen, UA 0.2     Nitrite, UA neg     Leukocytes, UA Negative     [facility] reading (PRIMARY) by  Dr. Tamala Julian.  LUMBAR SPINE: NAD; mild narrowing L4-5.      Assessment & Plan:   1. Lower back pain   2. Right sided sciatica    1.  R sciatica: recurrent/worsening in past week; continue Mobic and Robaxin; rx for Hydrocodone provided to use for severe pain. Advised for mild to moderate pain, take 1 Tylenol 650mg  only.  Home exercise program encouraged again.  If no improvement in 1-2 weeks, recommend follow-up with ortho; pt desires referral to be placed due to increased frequency and intensity of sciatica pain.  Meds ordered this encounter  Medications  . acetaminophen (TYLENOL) 650 MG CR tablet    Sig: Take 650 mg by mouth every 8 (eight) hours as needed for pain.  [name] HYDROcodone-acetaminophen (NORCO/VICODIN) 5-325 MG per tablet    Sig: Take 1 tablet by mouth every 6 (six) hours as needed for moderate pain.    Dispense:  30 tablet    Refill:  0    No Follow-up on file.  I personally performed the services described in this documentation, which was scribed in my presence. The recorded information has been reviewed and is accurate.  Reginia Forts, M.D.  [facility] [address] [place], [place]  [address] [phone] phone [phone] fax

## 2014-05-22 NOTE — Patient Instructions (Signed)
1.  If lower back pain persists, recommend follow up with Dr. [facility] of orth[facility]ics. 2. Do not take more than 1 Acetaminophen tablet every six hours for mild to moderate pain.  If pain is severe, take Hydrocodone one every six hours.

## 2014-05-30 ENCOUNTER — Other Ambulatory Visit: Payer: Self-pay | Admitting: Family Medicine

## 2014-05-30 ENCOUNTER — Ambulatory Visit
Admission: RE | Admit: 2014-05-30 | Discharge: 2014-05-30 | Disposition: A | Discharge status: Home / Self Care | Payer: Medicare Other | Source: Ambulatory Visit | Attending: Family Medicine | Admitting: Family Medicine

## 2014-05-30 DIAGNOSIS — R928 Other abnormal and inconclusive findings on diagnostic imaging of breast: Secondary | ICD-10-CM

## 2014-05-30 DIAGNOSIS — N6459 Other signs and symptoms in breast: Secondary | ICD-10-CM | POA: Diagnosis not present

## 2014-06-11 ENCOUNTER — Telehealth: Payer: Self-pay

## 2014-06-14 ENCOUNTER — Telehealth: Payer: Self-pay

## 2014-06-14 NOTE — Telephone Encounter (Signed)
Pharm sent request to change Symbicort Rx which is not covered by insurance to QVAR which is covered by pt's ins. Pended lowest dose QVAR since pt was on lower strength Symbicort, but did NOT put sig on Rx bc wasn't sure what dose you would want.

## 2014-06-18 MED ORDER — BECLOMETHASONE DIPROPIONATE 40 MCG/ACT IN AERS: 2.0000 | INHALATION_SPRAY | Freq: Two times a day (BID) | RESPIRATORY_TRACT | Status: DC

## 2014-06-18 NOTE — Telephone Encounter (Signed)
Rx for QVAR approved and sent to pharmacy.

## 2014-08-27 ENCOUNTER — Ambulatory Visit (INDEPENDENT_AMBULATORY_CARE_PROVIDER_SITE_OTHER): Payer: Medicare Other | Admitting: Family Medicine

## 2014-08-27 VITALS — BP 132/70 | HR 91 | Temp 97.9°F | Resp 18 | Wt 127.6 lb

## 2014-08-27 DIAGNOSIS — M545 Low back pain, unspecified: Secondary | ICD-10-CM

## 2014-08-27 DIAGNOSIS — M5441 Lumbago with sciatica, right side: Secondary | ICD-10-CM

## 2014-08-27 DIAGNOSIS — M5430 Sciatica, unspecified side: Secondary | ICD-10-CM

## 2014-08-27 DIAGNOSIS — M79604 Pain in right leg: Secondary | ICD-10-CM

## 2014-08-27 MED ORDER — HYDROCODONE-ACETAMINOPHEN 5-325 MG PO TABS: 1.0000 | ORAL_TABLET | Freq: Four times a day (QID) | ORAL | Status: DC | PRN

## 2014-08-27 MED ORDER — MELOXICAM 15 MG PO TABS: 15.0000 mg | ORAL_TABLET | Freq: Every day | ORAL | Status: DC

## 2014-08-27 MED ORDER — METHOCARBAMOL 750 MG PO TABS: 750.0000 mg | ORAL_TABLET | Freq: Four times a day (QID) | ORAL | Status: DC

## 2014-08-27 NOTE — Progress Notes (Signed)
This is a 53 y.o.female who complains of right sciatica.  Character of pain: sharp pain radiating down right leg Location of pain:  Right  Lower back and right posterior leg Radiation of pain:  Right let Onset associated with:  Moving furniture 2.5 weeks ato Patient has a past history of low back pain for which she has taken meloxicam and robaxin with pain medicine  Arriyana Rodell denies any urinary symptoms, bowel problems, numbness in the legs, loss of motor power. Wenda Low had no fever.  Karpel Gottlieb has tried generic tylenol without much help.  Past Medical History  Diagnosis Date  . Sciatic pain   . Schizoaffective disorder   . Hepatitis C   . Arthritis   . Asthma   . Migraine   . Allergy   . Depression   . Anxiety   . Alopecia   . Substance abuse   . Hypertension     onset age 73.  . Seizures     onset in childhood.  Generalized tonic-clonic.       Past Surgical History  Procedure Laterality Date  . Dilation and curettage of uterus    . Exploratory laparotomy    . Ovarian cyst removal  2008  . Abdominal hysterectomy      ovarian cyst B, cervical dysplasia, fibroids.   Ovaries intact.  . Behavioral health admissions      multiple  disabled from seizures and schizoaffective disorder.  She lives with daughter  Objective:  middle-aged female in no acute distress; she is restless Blood pressure 132/70, pulse 91, temperature 97.9 F (36.6 C), temperature source Oral, resp. rate 18, weight 127 lb 9.6 oz (57.879 kg), SpO2 99.00%.Body mass index is 22.25 kg/(m^2). Palpation of the back reveals some tenderness with deep palpation on right CVA:  nontender Abdomen: soft, nontender Peripheral pulses:  DP/PT good Inspection of the back: Reveals no scoliosis Straight-leg raising: positive on right Motor exam of lower extremity: No abnormal weakness. Reflexes: Symmetric and normal Skin exam: normal  Assessment/Plan: Acute lower back pain without acute neurological  findings.  Right-sided low back pain with right-sided sciatica - Plan: HYDROcodone-acetaminophen (NORCO/VICODIN) 5-325 MG per tablet, meloxicam (MOBIC) 15 MG tablet, Ambulatory referral to Physical Therapy  LBP radiating to right leg - Plan: HYDROcodone-acetaminophen (NORCO/VICODIN) 5-325 MG per tablet, methocarbamol (ROBAXIN-750) 750 MG tablet, Ambulatory referral to Physical Therapy  Signed, [name], MD

## 2014-08-27 NOTE — Patient Instructions (Signed)

## 2014-09-05 ENCOUNTER — Emergency Department (HOSPITAL_COMMUNITY)
Admission: EM | Admit: 2014-09-05 | Discharge: 2014-09-06 | Disposition: A | Discharge status: Other Acute Inpatient Hospital | Payer: Medicare HMO | Attending: Emergency Medicine | Admitting: Emergency Medicine

## 2014-09-05 ENCOUNTER — Encounter (HOSPITAL_COMMUNITY): Payer: Self-pay | Admitting: Emergency Medicine

## 2014-09-05 DIAGNOSIS — R259 Unspecified abnormal involuntary movements: Secondary | ICD-10-CM | POA: Diagnosis present

## 2014-09-05 DIAGNOSIS — F919 Conduct disorder, unspecified: Secondary | ICD-10-CM | POA: Insufficient documentation

## 2014-09-05 DIAGNOSIS — M129 Arthropathy, unspecified: Secondary | ICD-10-CM | POA: Diagnosis not present

## 2014-09-05 DIAGNOSIS — F200 Paranoid schizophrenia: Secondary | ICD-10-CM | POA: Diagnosis not present

## 2014-09-05 DIAGNOSIS — F1210 Cannabis abuse, uncomplicated: Secondary | ICD-10-CM | POA: Diagnosis not present

## 2014-09-05 DIAGNOSIS — J45909 Unspecified asthma, uncomplicated: Secondary | ICD-10-CM | POA: Diagnosis not present

## 2014-09-05 DIAGNOSIS — G40909 Epilepsy, unspecified, not intractable, without status epilepticus: Secondary | ICD-10-CM | POA: Insufficient documentation

## 2014-09-05 DIAGNOSIS — Z791 Long term (current) use of non-steroidal anti-inflammatories (NSAID): Secondary | ICD-10-CM | POA: Insufficient documentation

## 2014-09-05 DIAGNOSIS — F1010 Alcohol abuse, uncomplicated: Secondary | ICD-10-CM | POA: Diagnosis present

## 2014-09-05 DIAGNOSIS — IMO0002 Reserved for concepts with insufficient information to code with codable children: Secondary | ICD-10-CM | POA: Insufficient documentation

## 2014-09-05 DIAGNOSIS — G43909 Migraine, unspecified, not intractable, without status migrainosus: Secondary | ICD-10-CM | POA: Diagnosis not present

## 2014-09-05 DIAGNOSIS — F911 Conduct disorder, childhood-onset type: Secondary | ICD-10-CM | POA: Insufficient documentation

## 2014-09-05 DIAGNOSIS — F29 Unspecified psychosis not due to a substance or known physiological condition: Secondary | ICD-10-CM | POA: Insufficient documentation

## 2014-09-05 DIAGNOSIS — F250 Schizoaffective disorder, bipolar type: Secondary | ICD-10-CM

## 2014-09-05 DIAGNOSIS — Z79899 Other long term (current) drug therapy: Secondary | ICD-10-CM | POA: Diagnosis not present

## 2014-09-05 DIAGNOSIS — I10 Essential (primary) hypertension: Secondary | ICD-10-CM | POA: Insufficient documentation

## 2014-09-05 DIAGNOSIS — M549 Dorsalgia, unspecified: Secondary | ICD-10-CM | POA: Diagnosis not present

## 2014-09-05 DIAGNOSIS — F17200 Nicotine dependence, unspecified, uncomplicated: Secondary | ICD-10-CM | POA: Diagnosis not present

## 2014-09-05 DIAGNOSIS — Z8619 Personal history of other infectious and parasitic diseases: Secondary | ICD-10-CM | POA: Insufficient documentation

## 2014-09-05 DIAGNOSIS — F319 Bipolar disorder, unspecified: Secondary | ICD-10-CM | POA: Insufficient documentation

## 2014-09-05 DIAGNOSIS — Z872 Personal history of diseases of the skin and subcutaneous tissue: Secondary | ICD-10-CM | POA: Insufficient documentation

## 2014-09-05 LAB — URINE MICROSCOPIC-ADD ON

## 2014-09-05 LAB — VALPROIC ACID LEVEL: Valproic Acid Lvl: <10 ug/mL — ABNORMAL LOW (ref 50.0–100.0)

## 2014-09-05 LAB — COMPREHENSIVE METABOLIC PANEL
ALK PHOS: 104 U/L (ref 39–117)
ALT: 109 U/L — ABNORMAL HIGH (ref 0–35)
AST: 166 U/L — ABNORMAL HIGH (ref 0–37)
Albumin: 3.9 g/dL (ref 3.5–5.2)
Anion gap: 14 (ref 5–15)
BUN: 11 mg/dL (ref 6–23)
CO2: 24 mEq/L (ref 19–32)
Calcium: 9.2 mg/dL (ref 8.4–10.5)
Chloride: 106 mEq/L (ref 96–112)
Creatinine, Ser: 0.83 mg/dL (ref 0.50–1.10)
GFR calc Af Amer: >90 mL/min (ref >90)
GFR calc non Af Amer: 78 mL/min — ABNORMAL LOW (ref >90)
Glucose, Bld: 102 mg/dL — ABNORMAL HIGH (ref 70–99)
POTASSIUM: 3.9 meq/L (ref 3.7–5.3)
SODIUM: 144 meq/L (ref 137–147)
Total Bilirubin: 0.2 mg/dL — ABNORMAL LOW (ref 0.3–1.2)
Total Protein: 8.3 g/dL (ref 6.0–8.3)

## 2014-09-05 LAB — URINALYSIS, ROUTINE W REFLEX MICROSCOPIC
BILIRUBIN URINE: NEGATIVE
GLUCOSE, UA: NEGATIVE mg/dL
Ketones, ur: NEGATIVE mg/dL
Leukocytes, UA: NEGATIVE
Nitrite: NEGATIVE
PH: 5.5 (ref 5.0–8.0)
Protein, ur: NEGATIVE mg/dL
Specific Gravity, Urine: 1.012 (ref 1.005–1.030)
Urobilinogen, UA: 0.2 mg/dL (ref 0.0–1.0)

## 2014-09-05 LAB — CBC
HEMATOCRIT: 36.1 % (ref 36.0–46.0)
Hemoglobin: 12.3 g/dL (ref 12.0–15.0)
MCH: 32.4 pg (ref 26.0–34.0)
MCHC: 34.1 g/dL (ref 30.0–36.0)
MCV: 95 fL (ref 78.0–100.0)
Platelets: 204 10*3/uL (ref 150–400)
RBC: 3.8 MIL/uL — ABNORMAL LOW (ref 3.87–5.11)
RDW: 15.1 % (ref 11.5–15.5)
WBC: 5.7 10*3/uL (ref 4.0–10.5)

## 2014-09-05 LAB — SALICYLATE LEVEL: Salicylate Lvl: <2 mg/dL — ABNORMAL LOW (ref 2.8–20.0)

## 2014-09-05 LAB — ETHANOL: Alcohol, Ethyl (B): 170 mg/dL — ABNORMAL HIGH (ref 0–11)

## 2014-09-05 LAB — RAPID URINE DRUG SCREEN, HOSP PERFORMED
Amphetamines: NOT DETECTED
Barbiturates: NOT DETECTED
Benzodiazepines: NOT DETECTED
COCAINE: NOT DETECTED
Opiates: NOT DETECTED
TETRAHYDROCANNABINOL: POSITIVE — AB

## 2014-09-05 LAB — ACETAMINOPHEN LEVEL: Acetaminophen (Tylenol), Serum: <15 ug/mL (ref 10–30)

## 2014-09-05 MED ORDER — ZIPRASIDONE MESYLATE 20 MG IM SOLR
10.0000 mg | Freq: Once | INTRAMUSCULAR | Status: AC
  Administered 2014-09-05: 10 mg via INTRAMUSCULAR

## 2014-09-05 MED ORDER — ALBUTEROL SULFATE HFA 108 (90 BASE) MCG/ACT IN AERS: 2.0000 | INHALATION_SPRAY | RESPIRATORY_TRACT | Status: DC | PRN

## 2014-09-05 MED ORDER — ACETAMINOPHEN ER 650 MG PO TBCR: 650.0000 mg | EXTENDED_RELEASE_TABLET | Freq: Three times a day (TID) | ORAL | Status: DC | PRN

## 2014-09-05 MED ORDER — ZIPRASIDONE MESYLATE 20 MG IM SOLR
20.0000 mg | Freq: Once | INTRAMUSCULAR | Status: DC
  Filled 2014-09-05: qty 20

## 2014-09-05 MED ORDER — DIVALPROEX SODIUM ER 500 MG PO TB24
1000.0000 mg | ORAL_TABLET | Freq: Every day | ORAL | Status: DC
  Administered 2014-09-05 – 2014-09-06 (×2): 1000 mg via ORAL
  Filled 2014-09-05 (×2): qty 2

## 2014-09-05 MED ORDER — FLUTICASONE PROPIONATE HFA 44 MCG/ACT IN AERO: 1.0000 | INHALATION_SPRAY | Freq: Two times a day (BID) | RESPIRATORY_TRACT | Status: DC

## 2014-09-05 MED ORDER — LORAZEPAM 1 MG PO TABS
1.0000 mg | ORAL_TABLET | Freq: Three times a day (TID) | ORAL | Status: DC | PRN
  Administered 2014-09-06: 1 mg via ORAL
  Filled 2014-09-05: qty 1

## 2014-09-05 MED ORDER — STERILE WATER FOR INJECTION IJ SOLN
INTRAMUSCULAR | Status: AC
Start: 2014-09-05 — End: 2014-09-05
  Administered 2014-09-05: 18:00:00
  Filled 2014-09-05: qty 10

## 2014-09-05 MED ORDER — QUETIAPINE FUMARATE 300 MG PO TABS: 300.0000 mg | ORAL_TABLET | Freq: Two times a day (BID) | ORAL | Status: DC

## 2014-09-05 MED ORDER — CITALOPRAM HYDROBROMIDE 10 MG PO TABS
10.0000 mg | ORAL_TABLET | Freq: Every day | ORAL | Status: DC
  Administered 2014-09-05 – 2014-09-06 (×2): 10 mg via ORAL
  Filled 2014-09-05 (×2): qty 1

## 2014-09-05 MED ORDER — AMLODIPINE BESYLATE 5 MG PO TABS
5.0000 mg | ORAL_TABLET | Freq: Every day | ORAL | Status: DC
  Administered 2014-09-06: 5 mg via ORAL
  Filled 2014-09-05: qty 1

## 2014-09-05 MED ORDER — BUDESONIDE-FORMOTEROL FUMARATE 80-4.5 MCG/ACT IN AERO
2.0000 | INHALATION_SPRAY | Freq: Two times a day (BID) | RESPIRATORY_TRACT | Status: DC
  Administered 2014-09-05 – 2014-09-06 (×2): 2 via RESPIRATORY_TRACT
  Filled 2014-09-05: qty 6.9
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
## 2014-09-05 NOTE — ED Notes (Signed)
Pt changed into scrubs and socks. Pt has one belongings bag. Bag contains one pink bra, one brown t-shirt, one pair of black jeans, one pair of white underwear, one pair of socks, one pair of tennis shoes, one black belt, and one small bag containing a silver pair of earrings, a silver anklet, and a silver necklace.

## 2014-09-05 NOTE — ED Notes (Signed)
Contact info for pt's daughter   Darrick Meigs [phone]
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
## 2014-09-05 NOTE — ED Notes (Signed)
Bed: WA09 Expected date:  Expected time:  Means of arrival:  Comments: Ems- seizure, hallucinating?

## 2014-09-06 ENCOUNTER — Encounter (HOSPITAL_COMMUNITY): Payer: Self-pay | Admitting: Behavioral Health

## 2014-09-06 ENCOUNTER — Inpatient Hospital Stay (HOSPITAL_COMMUNITY)
Admission: AD | Admit: 2014-09-06 | Discharge: 2014-09-09 | DRG: 885 | Disposition: A | Discharge status: Hospice | Payer: Medicare HMO | Source: Intra-hospital | Attending: Psychiatry | Admitting: Psychiatry

## 2014-09-06 ENCOUNTER — Encounter (HOSPITAL_COMMUNITY): Payer: Self-pay | Admitting: *Deleted

## 2014-09-06 DIAGNOSIS — F1994 Other psychoactive substance use, unspecified with psychoactive substance-induced mood disorder: Secondary | ICD-10-CM | POA: Diagnosis present

## 2014-09-06 DIAGNOSIS — Z609 Problem related to social environment, unspecified: Secondary | ICD-10-CM

## 2014-09-06 DIAGNOSIS — G40909 Epilepsy, unspecified, not intractable, without status epilepticus: Secondary | ICD-10-CM

## 2014-09-06 DIAGNOSIS — F259 Schizoaffective disorder, unspecified: Principal | ICD-10-CM | POA: Diagnosis present

## 2014-09-06 DIAGNOSIS — J45909 Unspecified asthma, uncomplicated: Secondary | ICD-10-CM | POA: Diagnosis present

## 2014-09-06 DIAGNOSIS — R7989 Other specified abnormal findings of blood chemistry: Secondary | ICD-10-CM | POA: Diagnosis present

## 2014-09-06 DIAGNOSIS — I10 Essential (primary) hypertension: Secondary | ICD-10-CM

## 2014-09-06 DIAGNOSIS — M5441 Lumbago with sciatica, right side: Secondary | ICD-10-CM

## 2014-09-06 DIAGNOSIS — M129 Arthropathy, unspecified: Secondary | ICD-10-CM | POA: Diagnosis present

## 2014-09-06 DIAGNOSIS — F1210 Cannabis abuse, uncomplicated: Secondary | ICD-10-CM | POA: Diagnosis present

## 2014-09-06 DIAGNOSIS — F250 Schizoaffective disorder, bipolar type: Secondary | ICD-10-CM

## 2014-09-06 DIAGNOSIS — G4700 Insomnia, unspecified: Secondary | ICD-10-CM | POA: Diagnosis present

## 2014-09-06 DIAGNOSIS — Z91199 Patient's noncompliance with other medical treatment and regimen due to unspecified reason: Secondary | ICD-10-CM | POA: Diagnosis not present

## 2014-09-06 DIAGNOSIS — F411 Generalized anxiety disorder: Secondary | ICD-10-CM | POA: Diagnosis present

## 2014-09-06 DIAGNOSIS — F251 Schizoaffective disorder, depressive type: Secondary | ICD-10-CM

## 2014-09-06 DIAGNOSIS — R945 Abnormal results of liver function studies: Secondary | ICD-10-CM

## 2014-09-06 DIAGNOSIS — Z833 Family history of diabetes mellitus: Secondary | ICD-10-CM

## 2014-09-06 DIAGNOSIS — Z9119 Patient's noncompliance with other medical treatment and regimen: Secondary | ICD-10-CM | POA: Diagnosis not present

## 2014-09-06 DIAGNOSIS — Z8249 Family history of ischemic heart disease and other diseases of the circulatory system: Secondary | ICD-10-CM

## 2014-09-06 DIAGNOSIS — F1010 Alcohol abuse, uncomplicated: Secondary | ICD-10-CM | POA: Diagnosis present

## 2014-09-06 DIAGNOSIS — B1920 Unspecified viral hepatitis C without hepatic coma: Secondary | ICD-10-CM | POA: Diagnosis present

## 2014-09-06 DIAGNOSIS — F17200 Nicotine dependence, unspecified, uncomplicated: Secondary | ICD-10-CM | POA: Diagnosis present

## 2014-09-06 DIAGNOSIS — F200 Paranoid schizophrenia: Secondary | ICD-10-CM | POA: Diagnosis not present

## 2014-09-06 DIAGNOSIS — F1910 Other psychoactive substance abuse, uncomplicated: Secondary | ICD-10-CM

## 2014-09-06 MED ORDER — CITALOPRAM HYDROBROMIDE 10 MG PO TABS
10.0000 mg | ORAL_TABLET | Freq: Every day | ORAL | Status: DC
  Administered 2014-09-07 – 2014-09-09 (×3): 10 mg via ORAL
  Filled 2014-09-06 (×5): qty 1

## 2014-09-06 MED ORDER — QUETIAPINE FUMARATE 100 MG PO TABS: 100.0000 mg | ORAL_TABLET | Freq: Every day | ORAL | Status: DC

## 2014-09-06 MED ORDER — BUDESONIDE-FORMOTEROL FUMARATE 80-4.5 MCG/ACT IN AERO
2.0000 | INHALATION_SPRAY | Freq: Two times a day (BID) | RESPIRATORY_TRACT | Status: DC
Start: 2014-09-06 — End: 2014-09-09
  Administered 2014-09-06 – 2014-09-09 (×6): 2 via RESPIRATORY_TRACT
  Filled 2014-09-06 (×2): qty 6.9

## 2014-09-06 MED ORDER — HYDROCHLOROTHIAZIDE 12.5 MG PO CAPS
12.5000 mg | ORAL_CAPSULE | Freq: Every day | ORAL | Status: DC
  Administered 2014-09-07 – 2014-09-09 (×3): 12.5 mg via ORAL
  Filled 2014-09-06 (×5): qty 1

## 2014-09-06 MED ORDER — POTASSIUM CHLORIDE CRYS ER 20 MEQ PO TBCR: 10.0000 meq | EXTENDED_RELEASE_TABLET | Freq: Every day | ORAL | Status: DC

## 2014-09-06 MED ORDER — HYDROCHLOROTHIAZIDE 12.5 MG PO CAPS
12.5000 mg | ORAL_CAPSULE | Freq: Every day | ORAL | Status: DC
  Filled 2014-09-06: qty 1

## 2014-09-06 MED ORDER — QUETIAPINE FUMARATE 100 MG PO TABS
100.0000 mg | ORAL_TABLET | Freq: Every day | ORAL | Status: DC
  Administered 2014-09-06 – 2014-09-08 (×3): 100 mg via ORAL
  Filled 2014-09-06 (×5): qty 1

## 2014-09-06 MED ORDER — ALBUTEROL SULFATE HFA 108 (90 BASE) MCG/ACT IN AERS: 2.0000 | INHALATION_SPRAY | RESPIRATORY_TRACT | Status: DC | PRN

## 2014-09-06 MED ORDER — MAGNESIUM HYDROXIDE 400 MG/5ML PO SUSP
30.0000 mL | Freq: Every day | ORAL | Status: DC | PRN
Start: 2014-09-06 — End: 2014-09-09

## 2014-09-06 MED ORDER — QUETIAPINE FUMARATE 25 MG PO TABS
25.0000 mg | ORAL_TABLET | Freq: Three times a day (TID) | ORAL | Status: DC
  Administered 2014-09-06 – 2014-09-09 (×8): 25 mg via ORAL
  Filled 2014-09-06 (×13): qty 1

## 2014-09-06 MED ORDER — LORAZEPAM 1 MG PO TABS: 1.0000 mg | ORAL_TABLET | Freq: Three times a day (TID) | ORAL | Status: DC | PRN

## 2014-09-06 MED ORDER — ACETAMINOPHEN 325 MG PO TABS
650.0000 mg | ORAL_TABLET | Freq: Four times a day (QID) | ORAL | Status: DC | PRN
  Administered 2014-09-06 – 2014-09-08 (×3): 650 mg via ORAL
  Filled 2014-09-06 (×3): qty 2

## 2014-09-06 MED ORDER — LORAZEPAM 1 MG PO TABS
1.0000 mg | ORAL_TABLET | Freq: Three times a day (TID) | ORAL | Status: DC | PRN
  Administered 2014-09-06 – 2014-09-08 (×4): 1 mg via ORAL
  Filled 2014-09-06 (×4): qty 1

## 2014-09-06 MED ORDER — AMLODIPINE BESYLATE 5 MG PO TABS
5.0000 mg | ORAL_TABLET | Freq: Every day | ORAL | Status: DC
  Administered 2014-09-07 – 2014-09-09 (×3): 5 mg via ORAL
  Filled 2014-09-06 (×5): qty 1

## 2014-09-06 MED ORDER — ALUM & MAG HYDROXIDE-SIMETH 200-200-20 MG/5ML PO SUSP: 30.0000 mL | ORAL | Status: DC | PRN

## 2014-09-06 MED ORDER — QUETIAPINE FUMARATE 25 MG PO TABS: 25.0000 mg | ORAL_TABLET | Freq: Three times a day (TID) | ORAL | Status: DC

## 2014-09-06 MED ORDER — DIVALPROEX SODIUM ER 500 MG PO TB24
1000.0000 mg | ORAL_TABLET | Freq: Every day | ORAL | Status: DC
  Administered 2014-09-07 – 2014-09-09 (×3): 1000 mg via ORAL
  Filled 2014-09-06 (×5): qty 2

## 2014-09-06 MED ORDER — POTASSIUM CHLORIDE CRYS ER 10 MEQ PO TBCR
10.0000 meq | EXTENDED_RELEASE_TABLET | Freq: Every day | ORAL | Status: DC
  Administered 2014-09-07 – 2014-09-09 (×3): 10 meq via ORAL
  Filled 2014-09-06 (×5): qty 1

## 2014-09-06 NOTE — ED Notes (Signed)
Pt states she drinks 2-3 40 oz beers per day.

## 2014-09-06 NOTE — Consult Note (Signed)
[facility] Face-to-Face Psychiatry Consult   Reason for Consult:  paranoia Referring Physician:  EDP  Melissa Bridges is an 53 y.o. female. Total Time spent with patient: 20 minutes  Assessment: AXIS I:  Schizoaffective Disorder and Substance Abuse AXIS II:  Deferred AXIS III:   Past Medical History  Diagnosis Date  . Sciatic pain   . Schizoaffective disorder   . Hepatitis C   . Arthritis   . Asthma   . Migraine   . Allergy   . Depression   . Anxiety   . Alopecia   . Substance abuse   . Hypertension     onset age 13.  . Seizures     onset in childhood.  Generalized tonic-clonic.     AXIS IV:  problems related to social environment, problems with access to health care services and problems with primary support group AXIS V: Severe  Plan:  Recommend psychiatric Inpatient admission when medically cleared.  Dr. Darleene Cleaver assessed the patient and concurs with the plan.  Subjective:   Melissa Bridges is a 53 y.o. female patient admitted with paranoia and seizures. [name] reports she recently moved from [place] to [place] to live with her daughter. About a month ago patient reports she "ran out" of medications because she has yet to find a psychiatrist in [place], and started drinking 2 fourty ounce beers everyday for the last month. Patient states this has happened in the past; she will run out of medications so she will start drinking alcohol. [name] states she had a seizure since she ran out of medications this past month. Patient reports in the past she has come off her medications on her own when she starts to "feel better". Patient currently endorses AVH, stating "somebody is trying to get me", displays paranoid behavior, but denies SI/HI.   HPI Elements:   Location:  generalized. Quality:  acute. Severity:  severe. Timing:  constant. Duration:  month. Context:  not taking her medications..  Past Psychiatric History: Past Medical History  Diagnosis Date  . Sciatic  pain   . Schizoaffective disorder   . Hepatitis C   . Arthritis   . Asthma   . Migraine   . Allergy   . Depression   . Anxiety   . Alopecia   . Substance abuse   . Hypertension     onset age 49.  . Seizures     onset in childhood.  Generalized tonic-clonic.      reports that she has been smoking Cigarettes.  She has a 22 pack-year smoking history. She does not have any smokeless tobacco history on file. She reports that she drinks alcohol. She reports that she uses illicit drugs (Marijuana) about twice per week. Family History  Problem Relation Age of Onset  . Diabetes type II Mother   . Hypertension Mother   . Arthritis Mother   . Diabetes type II Maternal Aunt   . Cancer Maternal Uncle   . Hypertension Father   . Heart murmur Father   . Arthritis Father   . Alopecia Sister   . Alopecia Brother   . Alopecia Sister   . Mental retardation Sister    Family History Substance Abuse: No Family Supports: Yes, List: (Daughter ) Living Arrangements: Other relatives (Lives with family ) Can pt return to current living arrangement?: Yes Abuse/Neglect [facility]) Physical Abuse: Denies Verbal Abuse: Denies Sexual Abuse: Denies Allergies:   Allergies  Allergen Reactions  . Ibuprofen Other (See Comments)  Stomach upset  . Aspirin Rash and Other (See Comments)    Stomach upset    ACT Assessment Complete:  Yes:    Educational Status    Risk to Self: Risk to self with the past 6 months Suicidal Ideation: No-Not Currently/Within Last 6 Months Suicidal Intent: No-Not Currently/Within Last 6 Months Is patient at risk for suicide?: No Suicidal Plan?: No-Not Currently/Within Last 6 Months Access to Means: No What has been your use of drugs/alcohol within the last 12 months?: Pt admits she drinks beer and THC  Previous Attempts/Gestures: No How many times?: 0 Other Self Harm Risks: None  Triggers for Past Attempts: None known Intentional Self Injurious Behavior: None Family  Suicide History: No Recent stressful life event(s): Recent negative physical changes Persecutory voices/beliefs?: No Depression: Yes Depression Symptoms: Loss of interest in usual pleasures Substance abuse history and/or treatment for substance abuse?: Yes Suicide prevention information given to non-admitted patients: Not applicable  Risk to Others: Risk to Others within the past 6 months Homicidal Ideation: No Thoughts of Harm to Others: No Current Homicidal Intent: No Current Homicidal Plan: No Access to Homicidal Means: No Identified Victim: None  History of harm to others?: No Assessment of Violence: None Noted Violent Behavior Description: None  Does patient have access to weapons?: No Criminal Charges Pending?: No Does patient have a court date: No  Abuse: Abuse/Neglect Assessment (Assessment to be complete while patient is alone) Physical Abuse: Denies Verbal Abuse: Denies Sexual Abuse: Denies Exploitation of patient/patient's resources: Denies Self-Neglect: Denies  Prior Inpatient Therapy: Prior Inpatient Therapy Prior Inpatient Therapy: Yes Prior Therapy Dates: 2012 Prior Therapy Facilty/Provider([facility]): [facility]  Reason for Treatment: SI/depression   Prior Outpatient Therapy: Prior Outpatient Therapy Prior Outpatient Therapy: No Prior Therapy Dates: None  Prior Therapy Facilty/Provider(s): None  Reason for Treatment: None   Additional Information: Additional Information 1:1 In Past 12 Months?: No CIRT Risk: No Elopement Risk: No Does patient have medical clearance?: Yes                  Objective: Blood pressure 147/95, pulse 89, temperature 98.7 F (37.1 C), temperature source Oral, resp. rate 18, SpO2 99.00%.There is no weight on file to calculate BMI. Results for orders placed during the hospital encounter of 09/05/14 (from the past 72 hour(s))  ACETAMINOPHEN LEVEL     Status: None   Collection Time    09/05/14  6:01 PM      Result Value Ref Range    Acetaminophen (Tylenol), Serum <15.0  10 - 30 ug/mL   Comment:            THERAPEUTIC CONCENTRATIONS VARY     SIGNIFICANTLY. A RANGE OF 10-30     ug/mL MAY BE AN EFFECTIVE     CONCENTRATION FOR MANY PATIENTS.     HOWEVER, SOME ARE BEST TREATED     AT CONCENTRATIONS OUTSIDE THIS     RANGE.     ACETAMINOPHEN CONCENTRATIONS     >150 ug/mL AT 4 HOURS AFTER     INGESTION AND >50 ug/mL AT 12     HOURS AFTER INGESTION ARE     OFTEN ASSOCIATED WITH TOXIC     REACTIONS.  CBC     Status: Abnormal   Collection Time    09/05/14  6:01 PM      Result Value Ref Range   WBC 5.7  4.0 - 10.5 K/uL   RBC 3.80 (*) 3.87 - 5.11 MIL/uL   Hemoglobin 12.3  12.0 -  15.0 g/dL   HCT 36.1  36.0 - 46.0 %   MCV 95.0  78.0 - 100.0 fL   MCH 32.4  26.0 - 34.0 pg   MCHC 34.1  30.0 - 36.0 g/dL   RDW 15.1  11.5 - 15.5 %   Platelets 204  150 - 400 K/uL  COMPREHENSIVE METABOLIC PANEL     Status: Abnormal   Collection Time    09/05/14  6:01 PM      Result Value Ref Range   Sodium 144  137 - 147 mEq/L   Potassium 3.9  3.7 - 5.3 mEq/L   Chloride 106  96 - 112 mEq/L   CO2 24  19 - 32 mEq/L   Glucose, Bld 102 (*) 70 - 99 mg/dL   BUN 11  6 - 23 mg/dL   Creatinine, Ser 0.83  0.50 - 1.10 mg/dL   Calcium 9.2  8.4 - 10.5 mg/dL   Total Protein 8.3  6.0 - 8.3 g/dL   Albumin 3.9  3.5 - 5.2 g/dL   AST 166 (*) 0 - 37 U/L   ALT 109 (*) 0 - 35 U/L   Alkaline Phosphatase 104  39 - 117 U/L   Total Bilirubin 0.2 (*) 0.3 - 1.2 mg/dL   GFR calc non Af Amer 78 (*) >90 mL/min   GFR calc Af Amer >90  >90 mL/min   Comment: (NOTE)     The eGFR has been calculated using the CKD EPI equation.     This calculation has not been validated in all clinical situations.     eGFR's persistently <90 mL/min signify possible Chronic Kidney     Disease.   Anion gap 14  5 - 15  ETHANOL     Status: Abnormal   Collection Time    09/05/14  6:01 PM      Result Value Ref Range   Alcohol, Ethyl (B) 170 (*) 0 - 11 mg/dL   Comment:             LOWEST DETECTABLE LIMIT FOR     SERUM ALCOHOL IS 11 mg/dL     FOR MEDICAL PURPOSES ONLY  SALICYLATE LEVEL     Status: Abnormal   Collection Time    09/05/14  6:01 PM      Result Value Ref Range   Salicylate Lvl <4.0 (*) 2.8 - 20.0 mg/dL  VALPROIC ACID LEVEL     Status: Abnormal   Collection Time    09/05/14  6:05 PM      Result Value Ref Range   Valproic Acid Lvl <10.0 (*) 50.0 - 100.0 ug/mL   Comment: Performed at [facility] ([place])     Status: Abnormal   Collection Time    09/05/14  6:07 PM      Result Value Ref Range   Opiates NONE DETECTED  NONE DETECTED   Cocaine NONE DETECTED  NONE DETECTED   Benzodiazepines NONE DETECTED  NONE DETECTED   Amphetamines NONE DETECTED  NONE DETECTED   Tetrahydrocannabinol POSITIVE (*) NONE DETECTED   Barbiturates NONE DETECTED  NONE DETECTED   Comment:            DRUG SCREEN FOR MEDICAL PURPOSES     ONLY.  IF CONFIRMATION IS NEEDED     FOR ANY PURPOSE, NOTIFY LAB     WITHIN 5 DAYS.                LOWEST  DETECTABLE LIMITS     FOR URINE DRUG SCREEN     Drug Class       Cutoff (ng/mL)     Amphetamine      1000     Barbiturate      200     Benzodiazepine   528     Tricyclics       413     Opiates          300     Cocaine          300     THC              50  URINALYSIS, ROUTINE W REFLEX MICROSCOPIC     Status: Abnormal   Collection Time    09/05/14  6:07 PM      Result Value Ref Range   Color, Urine YELLOW  YELLOW   APPearance CLEAR  CLEAR   Specific Gravity, Urine 1.012  1.005 - 1.030   pH 5.5  5.0 - 8.0   Glucose, UA NEGATIVE  NEGATIVE mg/dL   Hgb urine dipstick TRACE (*) NEGATIVE   Bilirubin Urine NEGATIVE  NEGATIVE   Ketones, ur NEGATIVE  NEGATIVE mg/dL   Protein, ur NEGATIVE  NEGATIVE mg/dL   Urobilinogen, UA 0.2  0.0 - 1.0 mg/dL   Nitrite NEGATIVE  NEGATIVE   Leukocytes, UA NEGATIVE  NEGATIVE  URINE MICROSCOPIC-ADD ON     Status: None   Collection Time    09/05/14  6:07 PM       Result Value Ref Range   Squamous Epithelial / LPF RARE  RARE   WBC, UA 0-2  <3 WBC/hpf   RBC / HPF 0-2  <3 RBC/hpf   Labs are reviewed and are pertinent for no medical issues noted.  Current Facility-Administered Medications  Medication Dose Route Frequency Provider Last Rate Last Dose  . albuterol (PROVENTIL HFA;VENTOLIN HFA) 108 (90 BASE) MCG/ACT inhaler 2 puff  2 puff Inhalation Q4H PRN Malvin Johns, MD      . amLODipine (NORVASC) tablet 5 mg  5 mg Oral Daily Malvin Johns, MD   5 mg at 09/06/14 0930  . budesonide-formoterol (SYMBICORT) 80-4.5 MCG/ACT inhaler 2 puff  2 puff Inhalation BID Malvin Johns, MD   2 puff at 09/06/14 0929  . citalopram (CELEXA) tablet 10 mg  10 mg Oral Daily Malvin Johns, MD   10 mg at 09/06/14 0929  . divalproex (DEPAKOTE ER) 24 hr tablet 1,000 mg  1,000 mg Oral Daily Malvin Johns, MD   1,000 mg at 09/06/14 0929  . LORazepam (ATIVAN) tablet 1 mg  1 mg Oral Q8H PRN Waylan Boga, NP       Current Outpatient Prescriptions  Medication Sig Dispense Refill  . albuterol (PROVENTIL HFA;VENTOLIN HFA) 108 (90 BASE) MCG/ACT inhaler Inhale 2 puffs into the lungs every 4 (four) hours as needed.  1 Inhaler  11  . amLODipine (NORVASC) 5 MG tablet Take 5 mg by mouth daily.      . beclomethasone (QVAR) 40 MCG/ACT inhaler Inhale 2 puffs into the lungs 2 (two) times daily.  1 Inhaler  12  . budesonide-formoterol (SYMBICORT) 80-4.5 MCG/ACT inhaler Inhale 2 puffs into the lungs daily as needed (for shortness of breath).      . citalopram (CELEXA) 10 MG tablet Take 10 mg by mouth daily.      . divalproex (DEPAKOTE ER) 500 MG 24 hr tablet Take 1,000 mg by mouth daily.      [name]  HYDROcodone-acetaminophen (NORCO/VICODIN) 5-325 MG per tablet Take 1 tablet by mouth every 6 (six) hours as needed for moderate pain.  30 tablet  0  . meloxicam (MOBIC) 15 MG tablet Take 1 tablet (15 mg total) by mouth daily.  30 tablet  1  . QUEtiapine (SEROQUEL) 300 MG tablet Take 300 mg by mouth at  bedtime.        Psychiatric Specialty Exam:     Blood pressure 147/95, pulse 89, temperature 98.7 F (37.1 C), temperature source Oral, resp. rate 18, SpO2 99.00%.There is no weight on file to calculate BMI.  General Appearance: Disheveled  Eye Contact::  Good  Speech:  Clear and Coherent and Normal Rate  Volume:  Normal  Mood:  Anxious  Affect:  Appropriate  Thought Process:  Coherent, Goal Directed and Intact  Orientation:  Full (Time, Place, and Person)  Thought Content:  Paranoid  Suicidal Thoughts:  No  Homicidal Thoughts:  No  Memory:  Immediate;   Good Recent;   Good Remote;   Good  Judgement:  Fair  Insight:  Fair  Psychomotor Activity:  Restlessness, anxiety  Concentration:  Good  Recall:  Good  Fund of Knowledge:Fair  Language: Fair  Akathisia:  NA  Handed:  Right  AIMS (if indicated):     Assets:  Communication Skills Desire for Improvement Financial Resources/Insurance Physical Health Social Support  Sleep:      Musculoskeletal: Strength & Muscle Tone: within normal limits Gait & Station: normal Patient leans: N/A  Treatment Plan Summary: Daily contact with patient to assess and evaluate symptoms and progress in treatment Medication management; admit to inpatient unit.  Waylan Boga, [place] 09/06/2014 2:22 PM

## 2014-09-06 NOTE — Progress Notes (Signed)
Admission Note  D: Patient appropriate and cooperative with staff. Patient admitted to [facility] from [facility]. She reported that she recently moved here from [place] and has not been able to find a doctor to see, so she eventually ran out of her medications and began to drink on a daily basis, 2- 40 oz. beers. Also, she had verbalized that she's been hearing muffled voices telling her someone is out to get her, along with seeing shadows.   A: Support and encouragement provided to patient. Oriented patient to the unit and informed of her of the rules/policies of the hospital. Initiated Q15 minute checks for safety.  R: Patient receptive. Currently, denies SI/HI/AVH. Patient remains safe on the unit.

## 2014-09-06 NOTE — Progress Notes (Signed)
Psychoeducational Group Note  Date:  09/06/2014 Time:  2213  Group Topic/Focus:  Wrap-Up Group:   The focus of this group is to help patients review their daily goal of treatment and discuss progress on daily workbooks.  Participation Level: Did Not Attend  Participation Quality:  Not Applicable  Affect:  Not Applicable  Cognitive:  Not Applicable  Insight:  Not Applicable  Engagement in Group: Not Applicable  Additional Comments:  The patient did not attend group this evening.   Archie Balboa S 09/06/2014, 10:13 PM

## 2014-09-06 NOTE — Tx Team (Signed)
Initial Interdisciplinary Treatment Plan   PATIENT STRESSORS: Health problems Medication change or noncompliance Substance abuse   PROBLEM LIST: Problem List/Patient Goals Date to be addressed Date deferred Reason deferred Estimated date of resolution  Psychosis 09/06/14     Substance abuse 09/06/14                                                DISCHARGE CRITERIA:  Ability to meet basic life and health needs Improved stabilization in mood, thinking, and/or behavior Motivation to continue treatment in a less acute level of care  PRELIMINARY DISCHARGE PLAN: Attend aftercare/continuing care group Outpatient therapy Return to previous living arrangement  PATIENT/FAMIILY INVOLVEMENT: This treatment plan has been presented to and reviewed with the patient, Melissa Bridges.  The patient and family have been given the opportunity to ask questions and make suggestions.  Kathlen Brunswick 09/06/2014, 6:21 PM
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
## 2014-09-07 ENCOUNTER — Encounter (HOSPITAL_COMMUNITY): Payer: Self-pay | Admitting: Psychiatry

## 2014-09-07 DIAGNOSIS — F1994 Other psychoactive substance use, unspecified with psychoactive substance-induced mood disorder: Secondary | ICD-10-CM

## 2014-09-07 DIAGNOSIS — F1910 Other psychoactive substance abuse, uncomplicated: Secondary | ICD-10-CM

## 2014-09-07 DIAGNOSIS — F259 Schizoaffective disorder, unspecified: Principal | ICD-10-CM

## 2014-09-07 MED ORDER — METHOCARBAMOL 500 MG PO TABS: 500.0000 mg | ORAL_TABLET | Freq: Three times a day (TID) | ORAL | Status: DC | PRN

## 2014-09-07 MED ORDER — MELOXICAM 7.5 MG PO TABS
15.0000 mg | ORAL_TABLET | Freq: Every day | ORAL | Status: DC
  Administered 2014-09-07 – 2014-09-09 (×3): 15 mg via ORAL
  Filled 2014-09-07: qty 2
  Filled 2014-09-07: qty 1
  Filled 2014-09-07: qty 2
  Filled 2014-09-07 (×2): qty 1
  Filled 2014-09-07: qty 2

## 2014-09-07 MED ORDER — NICOTINE 21 MG/24HR TD PT24
21.0000 mg | MEDICATED_PATCH | Freq: Every day | TRANSDERMAL | Status: DC
  Administered 2014-09-07 – 2014-09-09 (×3): 21 mg via TRANSDERMAL
  Filled 2014-09-07 (×6): qty 1

## 2014-09-07 MED ORDER — MELOXICAM 15 MG PO TABS
15.0000 mg | ORAL_TABLET | Freq: Once | ORAL | Status: AC
  Administered 2014-09-07: 15 mg via ORAL
  Filled 2014-09-07: qty 2
  Filled 2014-09-07: qty 1

## 2014-09-07 NOTE — Plan of Care (Signed)
Problem: Ineffective individual coping Goal: STG: Patient will remain free from self harm Outcome: Progressing Pt denies SI and has been safe on the unit     Problem: Alteration in mood & ability to function due to Goal: LTG-Pt reports reduction in suicidal thoughts (Patient reports reduction in suicidal thoughts and is able to verbalize a safety plan for whenever patient is feeling suicidal)  Outcome: Progressing Denies SI Goal: STG-Patient will report withdrawal symptoms Outcome: Progressing Pt stated general aches and pains and was observed acting very anxious at the med window and pt stated she was feelin funny

## 2014-09-07 NOTE — Progress Notes (Signed)
Patient ID: Melissa Bridges, female   DOB: Nov 04, 1961, 53 y.o.   MRN: [id number] Psychoeducational Group Note  Date:  09/07/2014 Time:1020am  Group Topic/Focus:  Identifying Needs:   The focus of this group is to help patients identify their personal needs that have been historically problematic and identify healthy behaviors to address their needs.  Participation Level:  Active  Participation Quality:  Appropriate  Affect:  Appropriate  Cognitive:  Appropriate  Insight:  Supportive  Engagement in Group:  Supportive  Additional Comments:  Healthy coping skills.   Pricilla Larsson 09/07/2014,10:31 AM

## 2014-09-07 NOTE — Plan of Care (Signed)
Problem: Ineffective individual coping Goal: STG: Patient will remain free from self harm Outcome: Progressing Pt has remained free from self harm this shift.    Problem: Alteration in mood & ability to function due to Goal: STG-Patient will attend groups Outcome: Progressing Attended evening group 09/07/14.

## 2014-09-07 NOTE — Progress Notes (Signed)
Patient ID: Melissa Bridges, female   DOB: [birth year], 53 y.o.   MRN: [id number] Psychoeducational Group Note  Date:  09/07/2014 Time:1000am  Group Topic/Focus:  Identifying Needs:   The focus of this group is to help patients identify their personal needs that have been historically problematic and identify healthy behaviors to address their needs.  Participation Level:  Active  Participation Quality:  Appropriate  Affect:  Anxious  Cognitive:  Appropriate  Insight:  Supportive  Engagement in Group:  Supportive  Additional Comments:  Inventory group   Pricilla Larsson 09/07/2014,10:30 AM

## 2014-09-07 NOTE — Progress Notes (Signed)
D: Pt has appropriate affect, anxious mood.  Pt denies SI/HI/hallucinations.  Pt reports her day has been "good, I've mostly been asleep."   A: Medications administered per order.  Encouraged and supported pt.  Safety maintained. R: Pt is compliant with medications.  Pt verbally contracts for safety and is in no distress.  Pt attended evening group and interacts with peers and staff appropriately.  Will continue to monitor and assess for safety.

## 2014-09-07 NOTE — H&P (Signed)
Psychiatric Admission Assessment Adult  Melissa Bridges Identification:  Melissa Bridges Date of Evaluation:  09/07/2014 Chief Complaint:  SCHIZOAFFECTIVE DISORDER  Subjective: Pt seen and chart reviewed. Pt reports that she has been feeling depressed and "I drank a lot, a whole lot to try to feel better". Pt reports that her noncompliance with medication made her symptoms much worse when she ran out and that drinking was her coping mechanism. Denies SI, HI, and AVH, contracts for safety.   History of Present Illness:: Melissa Bridges is a 53 y.o. female Melissa Bridges admitted with paranoia and seizures. Melissa Bridges reports she recently moved from [place] to [place] to live with her daughter. About a month ago Melissa Bridges reports she "ran out" of medications because she has yet to find a psychiatrist in [place], and started drinking 2 fourty ounce beers everyday for the last month. Melissa Bridges states this has happened in the past; she will run out of medications so she will start drinking alcohol. Melissa Bridges states she had a seizure since she ran out of medications this past month. Melissa Bridges reports in the past she has come off her medications on her own when she starts to "feel better". Melissa Bridges currently endorses AVH, stating "somebody is trying to get me", displays paranoid behavior, but denies SI/HI.   Elements:  Location:  Psychiatric. Quality:  Improving. Severity:  Severe. Timing:  Intermittent. Duration:  Transient. Context:  Exacerbation of underlying psychiatric illness likely secondary to abrupt cessation of psychotropic medications. . Associated Signs/Synptoms: Depression Symptoms:  depressed mood, anhedonia, insomnia, psychomotor retardation, fatigue, hopelessness, impaired memory, (Hypo) Manic Symptoms:  Impulsivity, Anxiety Symptoms:  Excessive Worry, Psychotic Symptoms:  Paranoia (subsiding already) PTSD Symptoms: Denies Total Time spent with Melissa Bridges: 45 minutes  Psychiatric Specialty  Exam: Physical Exam Full Physical Exam performed in ED; reviewed, stable, and I concur with this assessment.   Review of Systems  Constitutional: Negative.   HENT: Negative.   Eyes: Negative.   Respiratory: Negative.   Cardiovascular: Negative.   Gastrointestinal: Negative.   Genitourinary: Negative.   Musculoskeletal: Negative.   Skin: Negative.   Neurological: Negative.   Endo/Heme/Allergies: Negative.   Psychiatric/Behavioral: Positive for depression. The Melissa Bridges is nervous/anxious.     Blood pressure 110/69, pulse 96, temperature 98.2 F (36.8 C), temperature source Oral, resp. rate 18, height 5' 3" (1.6 m), weight 57.607 kg (127 lb), SpO2 100.00%.Body mass index is 22.5 kg/(m^2).  General Appearance: Casual  Eye Contact::  Good  Speech:  Clear and Coherent  Volume:  Normal  Mood:  Anxious and Depressed  Affect:  Depressed  Thought Process:  Coherent and Goal Directed  Orientation:  Full (Time, Place, and Person)  Thought Content:  WDL  Suicidal Thoughts:  No  Homicidal Thoughts:  No  Memory:  Immediate;   Fair Recent;   Fair Remote;   Fair  Judgement:  Fair  Insight:  Fair  Psychomotor Activity:  NA  Concentration:  Good  Recall:  [place] of Knowledge:Good  Language: Good  Akathisia:  No  Handed:    AIMS (if indicated):     Assets:  Communication Skills Desire for Improvement Resilience  Sleep:  Number of Hours: 6.25    Musculoskeletal: Strength & Muscle Tone: within normal limits Gait & Station: normal Melissa Bridges leans: N/A  Past Psychiatric History: Diagnosis: Schizoaffective, Alcohol Abuse, Substance-induced mood disorder  Hospitalizations:[facility], [facility], [facility], [facility], [facility]  Outpatient Care: None current, hx in [place]  Substance Abuse Care: Denies  Self-Mutilation: Denies  Suicidal Attempts: Denies  Violent Behaviors: Yes, hitting others, but not in a long time   Past Medical History:   Past Medical History  Diagnosis Date  .  Sciatic pain   . Schizoaffective disorder   . Hepatitis C   . Arthritis   . Asthma   . Migraine   . Allergy   . Depression   . Anxiety   . Alopecia   . Substance abuse   . Hypertension     onset age 35.  . Seizures     onset in childhood.  Generalized tonic-clonic.     Seizure History:  Not in a long time Allergies:   Allergies  Allergen Reactions  . Ibuprofen Other (See Comments)    Stomach upset  . Aspirin Rash and Other (See Comments)    Stomach upset   PTA Medications: Prescriptions prior to admission  Medication Sig Dispense Refill  . albuterol (PROVENTIL HFA;VENTOLIN HFA) 108 (90 BASE) MCG/ACT inhaler Inhale 2 puffs into the lungs every 4 (four) hours as needed.  1 Inhaler  11  . amLODipine (NORVASC) 5 MG tablet Take 5 mg by mouth daily.      . beclomethasone (QVAR) 40 MCG/ACT inhaler Inhale 2 puffs into the lungs 2 (two) times daily.  1 Inhaler  12  . budesonide-formoterol (SYMBICORT) 80-4.5 MCG/ACT inhaler Inhale 2 puffs into the lungs daily as needed (for shortness of breath).      . citalopram (CELEXA) 10 MG tablet Take 10 mg by mouth daily.      . divalproex (DEPAKOTE ER) 500 MG 24 hr tablet Take 1,000 mg by mouth daily.      [name] HYDROcodone-acetaminophen (NORCO/VICODIN) 5-325 MG per tablet Take 1 tablet by mouth every 6 (six) hours as needed for moderate pain.  30 tablet  0  . meloxicam (MOBIC) 15 MG tablet Take 1 tablet (15 mg total) by mouth daily.  30 tablet  1  . QUEtiapine (SEROQUEL) 300 MG tablet Take 300 mg by mouth at bedtime.        Previous Psychotropic Medications:  Medication/Dose  SEE MAR               Substance Abuse History in the last 12 months:  Yes.    Consequences of Substance Abuse: Medical Consequences:  Hospitalization  Social History:  reports that she has been smoking Cigarettes.  She has a 44 pack-year smoking history. She does not have any smokeless tobacco history on file. She reports that she drinks about 8.4 ounces of  alcohol per week. She reports that she uses illicit drugs (Marijuana) about twice per week. 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 Diabetes type II Mother   . Hypertension Mother   . Arthritis Mother   . Diabetes type II Maternal Aunt   . Cancer Maternal Uncle   . Hypertension Father   . Heart murmur Father   . Arthritis Father   .  Alopecia Sister   . Alopecia Brother   . Alopecia Sister   . Mental retardation Sister     Results for orders placed during the hospital encounter of 09/05/14 (from the past 72 hour(s))  ACETAMINOPHEN LEVEL     Status: None   Collection Time    09/05/14  6:01 PM      Result Value Ref Range   Acetaminophen (Tylenol), Serum <15.0  10 - 30 ug/mL   Comment:            THERAPEUTIC CONCENTRATIONS VARY     SIGNIFICANTLY. A RANGE OF 10-30     ug/mL MAY BE AN EFFECTIVE     CONCENTRATION FOR MANY PATIENTS.     HOWEVER, SOME ARE BEST TREATED     AT CONCENTRATIONS OUTSIDE THIS     RANGE.     ACETAMINOPHEN CONCENTRATIONS     >150 ug/mL AT 4 HOURS AFTER     INGESTION AND >50 ug/mL AT 12     HOURS AFTER INGESTION ARE     OFTEN ASSOCIATED WITH TOXIC     REACTIONS.  CBC     Status: Abnormal   Collection Time    09/05/14  6:01 PM      Result Value Ref Range   WBC 5.7  4.0 - 10.5 K/uL   RBC 3.80 (*) 3.87 - 5.11 MIL/uL   Hemoglobin 12.3  12.0 - 15.0 g/dL   HCT 36.1  36.0 - 46.0 %    MCV 95.0  78.0 - 100.0 fL   MCH 32.4  26.0 - 34.0 pg   MCHC 34.1  30.0 - 36.0 g/dL   RDW 15.1  11.5 - 15.5 %   Platelets 204  150 - 400 K/uL  COMPREHENSIVE METABOLIC PANEL     Status: Abnormal   Collection Time    09/05/14  6:01 PM      Result Value Ref Range   Sodium 144  137 - 147 mEq/L   Potassium 3.9  3.7 - 5.3 mEq/L   Chloride 106  96 - 112 mEq/L   CO2 24  19 - 32 mEq/L   Glucose, Bld 102 (*) 70 - 99 mg/dL   BUN 11  6 - 23 mg/dL   Creatinine, Ser 0.83  0.50 - 1.10 mg/dL   Calcium 9.2  8.4 - 10.5 mg/dL   Total Protein 8.3  6.0 - 8.3 g/dL   Albumin 3.9  3.5 - 5.2 g/dL   AST 166 (*) 0 - 37 U/L   ALT 109 (*) 0 - 35 U/L   Alkaline Phosphatase 104  39 - 117 U/L   Total Bilirubin 0.2 (*) 0.3 - 1.2 mg/dL   GFR calc non Af Amer 78 (*) >90 mL/min   GFR calc Af Amer >90  >90 mL/min   Comment: (NOTE)     The eGFR has been calculated using the CKD EPI equation.     This calculation has not been validated in all clinical situations.     eGFR's persistently <90 mL/min signify possible Chronic Kidney     Disease.   Anion gap 14  5 - 15  ETHANOL     Status: Abnormal   Collection Time    09/05/14  6:01 PM      Result Value Ref Range   Alcohol, Ethyl (B) 170 (*) 0 - 11 mg/dL   Comment:            LOWEST DETECTABLE LIMIT FOR  SERUM ALCOHOL IS 11 mg/dL     FOR MEDICAL PURPOSES ONLY  SALICYLATE LEVEL     Status: Abnormal   Collection Time    09/05/14  6:01 PM      Result Value Ref Range   Salicylate Lvl <0.9 (*) 2.8 - 20.0 mg/dL  VALPROIC ACID LEVEL     Status: Abnormal   Collection Time    09/05/14  6:05 PM      Result Value Ref Range   Valproic Acid Lvl <10.0 (*) 50.0 - 100.0 ug/mL   Comment: Performed at [facility]     Status: Abnormal   Collection Time    09/05/14  6:07 PM      Result Value Ref Range   Opiates NONE DETECTED  NONE DETECTED   Cocaine NONE DETECTED  NONE DETECTED   Benzodiazepines NONE DETECTED  NONE  DETECTED   Amphetamines NONE DETECTED  NONE DETECTED   Tetrahydrocannabinol POSITIVE (*) NONE DETECTED   Barbiturates NONE DETECTED  NONE DETECTED   Comment:            DRUG SCREEN FOR MEDICAL PURPOSES     ONLY.  IF CONFIRMATION IS NEEDED     FOR ANY PURPOSE, NOTIFY LAB     WITHIN 5 DAYS.                LOWEST DETECTABLE LIMITS     FOR URINE DRUG SCREEN     Drug Class       Cutoff (ng/mL)     Amphetamine      1000     Barbiturate      200     Benzodiazepine   735     Tricyclics       329     Opiates          300     Cocaine          300     THC              50  URINALYSIS, ROUTINE W REFLEX MICROSCOPIC     Status: Abnormal   Collection Time    09/05/14  6:07 PM      Result Value Ref Range   Color, Urine YELLOW  YELLOW   APPearance CLEAR  CLEAR   Specific Gravity, Urine 1.012  1.005 - 1.030   pH 5.5  5.0 - 8.0   Glucose, UA NEGATIVE  NEGATIVE mg/dL   Hgb urine dipstick TRACE (*) NEGATIVE   Bilirubin Urine NEGATIVE  NEGATIVE   Ketones, ur NEGATIVE  NEGATIVE mg/dL   Protein, ur NEGATIVE  NEGATIVE mg/dL   Urobilinogen, UA 0.2  0.0 - 1.0 mg/dL   Nitrite NEGATIVE  NEGATIVE   Leukocytes, UA NEGATIVE  NEGATIVE  URINE MICROSCOPIC-ADD ON     Status: None   Collection Time    09/05/14  6:07 PM      Result Value Ref Range   Squamous Epithelial / LPF RARE  RARE   WBC, UA 0-2  <3 WBC/hpf   RBC / HPF 0-2  <3 RBC/hpf   Psychological Evaluations:  Assessment:   DSM5:  Schizophrenia Disorders:  Schizoaffective Obsessive-Compulsive Disorders:  N/A Trauma-Stressor Disorders:  N/A Substance/Addictive Disorders:  Alcohol Related Disorder - Severe (303.90) Depressive Disorders:  Major Depressive Disorder - Severe (296.23)  AXIS I:  Alcohol Abuse, Schizoaffective Disorder, Substance Abuse and Substance Induced Mood Disorder AXIS II:  Deferred AXIS III:  Past Medical History  Diagnosis Date  . Sciatic pain   . Schizoaffective disorder   . Hepatitis C   . Arthritis   . Asthma    . Migraine   . Allergy   . Depression   . Anxiety   . Alopecia   . Substance abuse   . Hypertension     onset age 74.  . Seizures     onset in childhood.  Generalized tonic-clonic.     AXIS IV:  other psychosocial or environmental problems, problems related to social environment and problems with access to health care services AXIS V:  41-50 serious symptoms  Treatment Plan/Recommendations:   Review of chart, vital signs, medications, and notes.  1-Individual and group therapy  2-Medication management for depression and anxiety: Medications reviewed with the Melissa Bridges and she stated no untoward effects. -Mobic resumed from home meds 3-Coping skills for depression, anxiety  4-Continue crisis stabilization and management  5-Address health issues--monitoring vital signs, stable  6-Treatment plan in progress to prevent relapse of depression and anxiety  Treatment Plan Summary: Daily contact with Melissa Bridges to assess and evaluate symptoms and progress in treatment Medication management Current Medications:  Current Facility-Administered Medications  Medication Dose Route Frequency Provider Last Rate Last Dose  . acetaminophen (TYLENOL) tablet 650 mg  650 mg Oral Q6H PRN Waylan Boga, NP   650 mg at 09/06/14 2130  . albuterol (PROVENTIL HFA;VENTOLIN HFA) 108 (90 BASE) MCG/ACT inhaler 2 puff  2 puff Inhalation Q4H PRN Waylan Boga, NP      . alum & mag hydroxide-simeth (MAALOX/MYLANTA) 200-200-20 MG/5ML suspension 30 mL  30 mL Oral Q4H PRN Waylan Boga, NP      . amLODipine (NORVASC) tablet 5 mg  5 mg Oral Daily Waylan Boga, NP   5 mg at 09/07/14 0818  . budesonide-formoterol (SYMBICORT) 80-4.5 MCG/ACT inhaler 2 puff  2 puff Inhalation BID Waylan Boga, NP   2 puff at 09/07/14 [phone]  . citalopram (CELEXA) tablet 10 mg  10 mg Oral Daily Waylan Boga, NP   10 mg at 09/07/14 0813  . divalproex (DEPAKOTE ER) 24 hr tablet 1,000 mg  1,000 mg Oral Daily Waylan Boga, NP   1,000 mg at 09/07/14 0813   . hydrochlorothiazide (MICROZIDE) capsule 12.5 mg  12.5 mg Oral Daily Waylan Boga, NP   12.5 mg at 09/07/14 0813  . LORazepam (ATIVAN) tablet 1 mg  1 mg Oral Q8H PRN Waylan Boga, NP   1 mg at 09/07/14 0815  . magnesium hydroxide (MILK OF MAGNESIA) suspension 30 mL  30 mL Oral Daily PRN Waylan Boga, NP      . potassium chloride (K-DUR,KLOR-CON) CR tablet 10 mEq  10 mEq Oral Daily Waylan Boga, NP   10 mEq at 09/07/14 0813  . QUEtiapine (SEROQUEL) tablet 100 mg  100 mg Oral QHS Waylan Boga, NP   100 mg at 09/06/14 2132  . QUEtiapine (SEROQUEL) tablet 25 mg  25 mg Oral TID Waylan Boga, NP   25 mg at 09/07/14 5638    Observation Level/Precautions:  15 minute checks  Laboratory:  Labs resulted, reviewed, and stable at this time.   Psychotherapy:  Group therapy, individual therapy, psychoeducation  Medications:  See MAR above  Consultations: None    Discharge Concerns: None    Estimated LOS: 5-7 days  Other:  N/A   I certify that inpatient services furnished can reasonably be expected to improve the Melissa Bridges's condition.   Benjamine Mola, FNP-BC 9/26/201511:19 AM I  personally assessed the Melissa Bridges, reviewed the physical exam and labs and formulated the treatment plan Geralyn Flash A. Sabra Heck, M.D.

## 2014-09-07 NOTE — [facility] Suicide Risk Assessment (Signed)
Suicide Risk Assessment  Admission Assessment     Nursing information obtained from:    Demographic factors:    Current Mental Status:    Loss Factors:    Historical Factors:    Risk Reduction Factors:    Total Time spent with patient: 30 min  CLINICAL FACTORS:   Alcohol/Substance Abuse/Dependencies Schizophrenia:   Depressive state  Psychiatric Specialty Exam:     Blood pressure 110/69, pulse 96, temperature 98.2 F (36.8 C), temperature source Oral, resp. rate 18, height 5\' 3"  (1.6 m), weight 57.607 kg (127 lb), SpO2 100.00%.Body mass index is 22.5 kg/(m^2).  General Appearance: Fairly Groomed  Engineer, water::  Fair  Speech:  Clear and Coherent  Volume:  Normal  Mood:  Anxious, Depressed and worried  Affect:  Restricted  Thought Process:  Coherent and Goal Directed  Orientation:  Full (Time, Place, and Person)  Thought Content:  symptoms, worries, concerns  Suicidal Thoughts:  No  Homicidal Thoughts:  No  Memory:  Immediate;   Fair Recent;   Fair Remote;   Fair  Judgement:  Fair  Insight:  Present  Psychomotor Activity:  Restlessness  Concentration:  Fair  Recall:  [employer] of Knowledge:NA  Language: Fair  Akathisia:  No  Handed:    AIMS (if indicated):     Assets:  Desire for Improvement  Sleep:  Number of Hours: 6.25   Musculoskeletal: Strength & Muscle Tone: within normal limits Gait & Station: normal Patient leans: N/A  COGNITIVE FEATURES THAT CONTRIBUTE TO RISK:  Closed-mindedness Loss of executive function Polarized thinking Thought constriction (tunnel vision)    SUICIDE RISK:   Moderate:  Frequent suicidal ideation with limited intensity, and duration, some specificity in terms of plans, no associated intent, good self-control, limited dysphoria/symptomatology, some risk factors present, and identifiable protective factors, including available and accessible social support.  PLAN OF CARE: Supportive approach/coping skills/relapse prevention                            Resume medications  I certify that inpatient services furnished can reasonably be expected to improve the patient's condition.  Trever Streater A 09/07/2014, 6:08 PM

## 2014-09-07 NOTE — Progress Notes (Signed)
Patient ID: Melissa Bridges, female   DOB: [birth year], 53 y.o.   MRN: [id number] Nursing shift note: D: pt has been visible and very cooperative today. She is coming to the window for her medications, going to groups and interacting with her peers. A: she is becoming reacquainted with her medications at this facility, since she had been non compliant with them prior to this admission. She is showing no adverse effects from medications. RN obtained orders for mobic for musculoskeletal issues and she got a ativan prn this am for anxiety. R: she denies any si/hi/av today. RN will monitor and Q 15 min ck's continues.

## 2014-09-07 NOTE — [facility] Group Notes (Signed)
[facility] Group Notes:  (Clinical Social Work)  09/07/2014  11:15-12:00PM  Summary of Progress/Problems:   The main focus of today's process group was to discuss patients' feelings about hospitalization, the stigma attached to mental health, and sources of motivation to stay well.  We then worked to identify a specific plan to avoid future hospitalizations when discharged from the hospital for this admission.  The patient expressed nothing during group, just shook her head when asked questions.  She was a few minutes late to group, then left a few minutes early, not contributing at all, but remaining alert throughout.  Type of Therapy:  Group Therapy - Process  Participation Level:  Minimal  Participation Quality:  Attentive  Affect:  Flat  Cognitive:  Alert  Insight:  Limited  Engagement in Therapy:  Limited  Modes of Intervention:  Exploration, Discussion  Selmer Dominion, LCSW 09/07/2014, 12:42 PM

## 2014-09-07 NOTE — Progress Notes (Signed)
The focus of this group is to help patients review their daily goal of treatment and discuss progress on daily workbooks. Pt attended the evening group session and responded to all discussion prompts from the [facility]. Pt shared that today was a good day, the highlight of which was talking to her parents on the phone, whom she considers supportive of her seeking treatment. Pt told the Writer that she hoped to go home soon so that she can interview for a job she was wanting. "I won't make much money in this job, but it's more about getting out, doing something and having comradery among my coworkers." When asked, Pt said she had no additional needs from Nursing Staff this evening. Her affect was appropriate.

## 2014-09-08 NOTE — Progress Notes (Addendum)
[facility] MD Progress Note  09/08/2014 12:15 PM Melissa Bridges  MRN:  [id number] Subjective:   Patient was seen today on rounds in the day room.  She reports doing well and that she slept well last night.  Patient states she regrets not seeking help earlier and instead  started drinking and using Marijuana.  Patient states she feels much better getting back on her medications.  She plans to have an outpatient Psychiatrist in place before leaving the hospital.  Patient enjoys participating in groups.  She denies SI/HI/AVH Diagnosis:   DSM5: Schizophrenia Disorders:   Obsessive-Compulsive Disorders:   Trauma-Stressor Disorders:   Substance/Addictive Disorders:  Alcohol Related Disorder - Moderate (303.90), Substance abuse, Marijuana abuse Depressive Disorders:  Schizoaffective disorder Total Time spent with patient: 30 minutes  Axis I: Schizoaffective Disorder and Substance Abuse Axis II: Deferred Axis III:  Past Medical History  Diagnosis Date  . Sciatic pain   . Schizoaffective disorder   . Hepatitis C   . Arthritis   . Asthma   . Migraine   . Allergy   . Depression   . Anxiety   . Alopecia   . Substance abuse   . Hypertension     onset age 21.  . Seizures     onset in childhood.  Generalized tonic-clonic.     Axis IV: other psychosocial or environmental problems and problems related to social environment Axis V: 51-60 moderate symptoms  ADL's:  Intact  Sleep: Good  Appetite:  Good  Suicidal Ideation:  Denies Homicidal Ideation:  Denies Psychiatric Specialty Exam: Physical Exam  ROS  Blood pressure 131/91, pulse 78, temperature 98.2 F (36.8 C), temperature source Oral, resp. rate 17, height 5\' 3"  (1.6 m), weight 57.607 kg (127 lb), SpO2 100.00%.Body mass index is 22.5 kg/(m^2).  General Appearance: Casual and Fairly Groomed  Engineer, water::  Good  Speech:  Clear and Coherent and Normal Rate  Volume:  Normal  Mood:  Euthymic  Affect:  Appropriate and Congruent   Thought Process:  Coherent, Goal Directed and Intact  Orientation:  Full (Time, Place, and Person)  Thought Content:  WDL  Suicidal Thoughts:  No  Homicidal Thoughts:  No  Memory:  Immediate;   Good Recent;   Good Remote;   Good  Judgement:  Good  Insight:  Good  Psychomotor Activity:  Normal  Concentration:  Good  Recall:  NA  Fund of Knowledge:Good  Language: Good  Akathisia:  NA  Handed:  Right  AIMS (if indicated):     Assets:  Desire for Improvement  Sleep:  Number of Hours: 6.25   Musculoskeletal: Strength & Muscle Tone: within normal limits Gait & Station: normal Patient leans: N/A  Current Medications: Current Facility-Administered Medications  Medication Dose Route Frequency Provider Last Rate Last Dose  . acetaminophen (TYLENOL) tablet 650 mg  650 mg Oral Q6H PRN Waylan Boga, NP   650 mg at 09/07/14 2112  . albuterol (PROVENTIL HFA;VENTOLIN HFA) 108 (90 BASE) MCG/ACT inhaler 2 puff  2 puff Inhalation Q4H PRN Waylan Boga, NP      . alum & mag hydroxide-simeth (MAALOX/MYLANTA) 200-200-20 MG/5ML suspension 30 mL  30 mL Oral Q4H PRN Waylan Boga, NP      . amLODipine (NORVASC) tablet 5 mg  5 mg Oral Daily Waylan Boga, NP   5 mg at 09/08/14 0757  . budesonide-formoterol (SYMBICORT) 80-4.5 MCG/ACT inhaler 2 puff  2 puff Inhalation BID Waylan Boga, NP   2 puff at 09/08/14 0756  .  citalopram (CELEXA) tablet 10 mg  10 mg Oral Daily Waylan Boga, NP   10 mg at 09/08/14 0757  . divalproex (DEPAKOTE ER) 24 hr tablet 1,000 mg  1,000 mg Oral Daily Waylan Boga, NP   1,000 mg at 09/08/14 0757  . hydrochlorothiazide (MICROZIDE) capsule 12.5 mg  12.5 mg Oral Daily Waylan Boga, NP   12.5 mg at 09/08/14 0757  . LORazepam (ATIVAN) tablet 1 mg  1 mg Oral Q8H PRN Waylan Boga, NP   1 mg at 09/07/14 2112  . magnesium hydroxide (MILK OF MAGNESIA) suspension 30 mL  30 mL Oral Daily PRN Waylan Boga, NP      . meloxicam (MOBIC) tablet 15 mg  15 mg Oral Daily Benjamine Mola, FNP   15 mg  at 09/08/14 0758  . methocarbamol (ROBAXIN) tablet 500 mg  500 mg Oral Q8H PRN Nicholaus [place], MD      . nicotine (NICODERM CQ - dosed in mg/24 hours) patch 21 mg  21 mg Transdermal Daily London Pepper, RN   21 mg at 09/08/14 0757  . potassium chloride (K-DUR,KLOR-CON) CR tablet 10 mEq  10 mEq Oral Daily Waylan Boga, NP   10 mEq at 09/08/14 0758  . QUEtiapine (SEROQUEL) tablet 100 mg  100 mg Oral QHS Waylan Boga, NP   100 mg at 09/07/14 2102  . QUEtiapine (SEROQUEL) tablet 25 mg  25 mg Oral TID Waylan Boga, NP   25 mg at 09/08/14 1159    Lab Results: No results found for this or any previous visit (from the past 48 hour(s)).  Physical Findings: AIMS:  , ,  ,  ,    CIWA:    COWS:     Treatment Plan Summary: Daily contact with patient to assess and evaluate symptoms and progress in treatment Medication management  Plan: Continue with plan of care Continue crisis management Encourage to participate in group and individual sessions Continue medication management/ and review as needed Divalproex 1000 mg ER po daily for mood stabilization Seroquel 25 mg po three times a day and 100 mg at bed time. Discharge  Plan in progress Address health issues /V/S as needed    Medical Decision Making Problem Points:  Established problem, stable/improving (1) Data Points:  Review and summation of old records (2)  I certify that inpatient services furnished can reasonably be expected to improve the patient's condition.   Charmaine Downs, C   PMHNP-BC 09/08/2014, 12:15 PM I agree with assessment and plan Geralyn Flash A. Sabra Heck, M.D.
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
## 2014-09-08 NOTE — BHH Group Notes (Signed)
[facility] Group Notes:  (Clinical Social Work)  09/08/2014   11:15am-12:00pm  Summary of Progress/Problems:  The main focus of today's process group was to listen to a variety of genres of music and to identify that different types of music provoke different responses.  The patient then was able to identify personally what was soothing for them, as well as energizing.  Handouts were used to record feelings evoked, as well as how patient can personally use this knowledge in sleep habits, with depression, and with other symptoms.  The patient expressed understanding of concepts, as well as knowledge of how each type of music affected him/her and how this can be used at home as a wellness/recovery tool.  She was interactive with the music at many points during group, dancing, clapping, smiling.  Type of Therapy:  Music Therapy   Participation Level:  Active  Participation Quality:  Attentive and Sharing  Affect:  Blunted  Cognitive:  Oriented  Insight:  Engaged  Engagement in Therapy:  Engaged  Modes of Intervention:   Activity, Exploration  Selmer Dominion, LCSW 09/08/2014, 12:30pm

## 2014-09-08 NOTE — Progress Notes (Addendum)
Patient ID: Melissa Bridges, female   DOB: Apr 30, 1961, 53 y.o.   MRN: [id number] Nursing shift note: D: pt has been visible in the milieu today, taking her medications and went to her groups. A: when asked by RN, she has not voiced any complaints of pain or otherwise today. She has not made any reference to any auditory hallucinations, nor any feeling that " someone is out to get her" today and she denies any si/hi. R: she is receptive to suggestions by staff and has been cooperative. Pt fill out a 72 hour request form for discharge on 09-08-14  At 0930 am. RN will monitor and Q 15 min ck's continue.

## 2014-09-09 DIAGNOSIS — F319 Bipolar disorder, unspecified: Secondary | ICD-10-CM

## 2014-09-09 DIAGNOSIS — G40909 Epilepsy, unspecified, not intractable, without status epilepticus: Secondary | ICD-10-CM

## 2014-09-09 DIAGNOSIS — F2081 Schizophreniform disorder: Secondary | ICD-10-CM

## 2014-09-09 DIAGNOSIS — Z9119 Patient's noncompliance with other medical treatment and regimen: Secondary | ICD-10-CM

## 2014-09-09 DIAGNOSIS — R945 Abnormal results of liver function studies: Secondary | ICD-10-CM

## 2014-09-09 DIAGNOSIS — Z91199 Patient's noncompliance with other medical treatment and regimen due to unspecified reason: Secondary | ICD-10-CM

## 2014-09-09 DIAGNOSIS — R7989 Other specified abnormal findings of blood chemistry: Secondary | ICD-10-CM

## 2014-09-09 DIAGNOSIS — F1010 Alcohol abuse, uncomplicated: Secondary | ICD-10-CM

## 2014-09-09 DIAGNOSIS — F1020 Alcohol dependence, uncomplicated: Secondary | ICD-10-CM

## 2014-09-09 MED ORDER — ARIPIPRAZOLE 10 MG PO TABS
10.0000 mg | ORAL_TABLET | Freq: Every evening | ORAL | Status: DC
  Filled 2014-09-09 (×2): qty 1

## 2014-09-09 NOTE — Consult Note (Addendum)
[facility] Medical Consultation  Melissa Bridges BTD:[id number] DOB: 07/28/1961 DOA: 09/06/2014 PCP: Reginia Forts, MD   Requesting physician: Dr. Shea Evans  Date of consultation: 09/09/2014 Reason for consultation: elevated LFTs and use of Depakote  Impression/Recommendations Active Problems:   Schizoaffective disorder   Elevated LFTs  1. Elevated LFTs - patient has a history of HCV as well as active ETOH abuse. She is non compliant with her Depakote and has not been using it for the past couple of months. I believe that her current LFT elevation is due to ETOH abuse mainly, and possibly superimposed HCV, especially given AST:ALT ratio. Alkaline phosphatase is more sensitive for Depakote liver toxicity and her current levels are normal. Patient does not wish to change her current regimen since it has been working well for her for a number of years. I recommend to continue Depakote with close monitoring of her LFTs as an outpatient. She needs to be established with a local PCP, Neurology and GI. Recommend case management consult to establish a PCP. Will add information for [facility] on her AVS. Her case was discussed over the phone as well with the on call Neurologist, Dr. Doy Mince. Repeat LFTs on Wednesday (and HCV viral load) if she is still here, otherwise she should get her LFTs as an outpatient in ~1 week to monitor for Depakote liver toxicity. 2. Seizure disorder - continue current regimen, suspect non compliance and ETOH use play a major role in her seizures. 3. HCV - recommend outpatient referral to GI.  Will sign off, please call with additional questions, thank you for the consult.   HPI:  53 yo F with history of schizoaffective disorder, seizure disorder, ETOH abuse, HCV, medical non-compliance, admitted to [facility] on 9/26 with paranoia and seizures.   Review of Systems:  Negative for chest pain, dyspnea, headaches, no GI or GU complaints, otherwise negative.   Past  Medical History  Diagnosis Date  . Sciatic pain   . Schizoaffective disorder   . Hepatitis C   . Arthritis   . Asthma   . Migraine   . Allergy   . Depression   . Anxiety   . Alopecia   . Substance abuse   . Hypertension     onset age 35.  . Seizures     onset in childhood.  Generalized tonic-clonic.     Past Surgical History  Procedure Laterality Date  . Dilation and curettage of uterus    . Exploratory laparotomy    . Ovarian cyst removal  2008  . Abdominal hysterectomy      ovarian cyst B, cervical dysplasia, fibroids.   Ovaries intact.  . Behavioral health admissions      multiple   Social History:  reports that she has been smoking Cigarettes.  She has a 44 pack-year smoking history. She does not have any smokeless tobacco history on file. She reports that she drinks about 8.4 ounces of alcohol per week. She reports that she uses illicit drugs (Marijuana) about twice per week.  Allergies  Allergen Reactions  . Ibuprofen Other (See Comments)    Stomach upset; However, pt can take Meloxicam without incident and takes this at home.   . Aspirin Rash and Other (See Comments)    Stomach upset   Family History  Problem Relation Age of Onset  . Diabetes type II Mother   . Hypertension Mother   . Arthritis Mother   . Diabetes type II Maternal Aunt   . Cancer Maternal  Uncle   . Hypertension Father   . Heart murmur Father   . Arthritis Father   . Alopecia Sister   . Alopecia Brother   . Alopecia Sister   . Mental retardation Sister     Prior to Admission medications   Medication Sig Start Date End Date Taking? Authorizing Provider  albuterol (PROVENTIL HFA;VENTOLIN HFA) 108 (90 BASE) MCG/ACT inhaler Inhale 2 puffs into the lungs every 4 (four) hours as needed. 03/08/13   Wardell Honour, MD  amLODipine (NORVASC) 5 MG tablet Take 5 mg by mouth daily.    Historical Provider, MD  beclomethasone (QVAR) 40 MCG/ACT inhaler Inhale 2 puffs into the lungs 2 (two) times daily.     Wardell Honour, MD  budesonide-formoterol [facility]) 80-4.5 MCG/ACT inhaler Inhale 2 puffs into the lungs daily as needed (for shortness of breath).    Historical Provider, MD  citalopram (CELEXA) 10 MG tablet Take 10 mg by mouth daily.    Historical Provider, MD  divalproex (DEPAKOTE ER) 500 MG 24 hr tablet Take 1,000 mg by mouth daily.    Historical Provider, MD  HYDROcodone-acetaminophen (NORCO/VICODIN) 5-325 MG per tablet Take 1 tablet by mouth every 6 (six) hours as needed for moderate pain. 08/27/14   [name], MD  meloxicam (MOBIC) 15 MG tablet Take 1 tablet (15 mg total) by mouth daily. 08/27/14   [name], MD  QUEtiapine (SEROQUEL) 300 MG tablet Take 300 mg by mouth at bedtime.    Historical Provider, MD   Physical Exam: Blood pressure 113/77, pulse 83, temperature 98.6 F (37 C), temperature source Oral, resp. rate 20, height 5\' 3"  (1.6 m), weight 57.607 kg (127 lb), SpO2 100.00%. Filed Vitals:   09/09/14 0803  BP: 113/77  Pulse: 83  Temp:   Resp:      General:  NAD  Eyes: no scleral icterus  Cardiovascular: RRR no MRG  Respiratory: CTA biL  Abdomen: soft, non tender  Skin: no rashes  Musculoskeletal: no edema  Neurologic: non focal  Labs on Admission:  Basic Metabolic Panel:  Recent Labs Lab 09/05/14 1801  NA 144  K 3.9  CL 106  CO2 24  GLUCOSE 102*  BUN 11  CREATININE 0.83  CALCIUM 9.2   Liver Function Tests:  Recent Labs Lab 09/05/14 1801  AST 166*  ALT 109*  ALKPHOS 104  BILITOT 0.2*  PROT 8.3  ALBUMIN 3.9   CBC:  Recent Labs Lab 09/05/14 1801  WBC 5.7  HGB 12.3  HCT 36.1  MCV 95.0  PLT 204   Time spent: 70 minutes  Marzetta Board [facility] Pager [phone]  If 7PM-7AM, please contact night-coverage [email or web address] Password [facility] 09/09/2014, 12:37 PM

## 2014-09-09 NOTE — Progress Notes (Signed)
[facility] Adult Case Management Discharge Plan :  Will you be returning to the same living situation after discharge: No. At discharge, do you have transportation home?:No. Do you have the ability to pay for your medications:Yes,  insurance  Release of information consent forms completed and in the chart;  Patient's signature needed at discharge.  Patient to Follow up at: Follow-up Information   Follow up with SMITH,KRISTI, MD In 1 week.   Specialty:  Family Medicine   Contact information:   [place] [address] [phone]       Follow up with [facility]    . Schedule an appointment as soon as possible for a visit in 2 weeks.   Contact information:   [place] [id number] [phone]      Follow up with [facility]. Schedule an appointment as soon as possible for a visit in 3 weeks.   Specialty:  Neurology   Contact information:   [address] [place], [place] [address] [phone]      Follow up with [facility]. (You will need to call for a follow up appointment for psychiatry and counseling.  I was unable to schedule one for you before you left.)    Contact information:   [place] [id number]      Patient denies SI/HI:   Yes,  yes    Safety Planning and Suicide Prevention discussed:  Yes,  yes  Roque Lias B 09/09/2014, 3:50 PM

## 2014-09-09 NOTE — BHH Group Notes (Signed)
[facility] LCSW Aftercare Discharge Planning Group Note   09/09/2014 11:31 AM  Participation Quality:  Engaged  Mood/Affect:  Flat  Depression Rating:  denies  Anxiety Rating:  denies  Thoughts of Suicide:  No Will you contract for safety?   NA  Current AVH:  Denies  Plan for Discharge/Comments:  "I had a seizure, so they brought me to the emergency room.  I just needed to get back on my meds.  I am ready to be discharged."  Mentions nothing about drinking alcohol.  Insists that she signed her 68 hour request on Saturday and she should be released today.  Was living in [place] for 5-6 years until hse came to [place] to stay with daughter in the spring.  I gave her number for [facility] as she said she had an appointment today that she needed to reschedule.  Transportation Means: daughter  Supports: daughter   Trish Mage

## 2014-09-09 NOTE — [facility] Suicide Risk Assessment (Addendum)
Demographic Factors:  Low socioeconomic status and Unemployed  Total Time spent with patient: 20 minutes  Psychiatric Specialty Exam: Physical Exam  Constitutional: She is oriented to person, place, and time. She appears well-developed and well-nourished.  HENT:  Head: Normocephalic and atraumatic.  Neck: Normal range of motion. Neck supple.  Musculoskeletal: Normal range of motion.  Neurological: She is alert and oriented to person, place, and time.  Psychiatric: Her speech is normal. Her mood appears not anxious. Cognition and memory are normal. She expresses impulsivity. She does not exhibit a depressed mood. She expresses no homicidal and no suicidal ideation.    Review of Systems  Constitutional: Negative.   HENT: Negative.   Eyes: Negative.   Respiratory: Negative.   Cardiovascular: Negative.   Gastrointestinal: Negative.   Genitourinary: Negative.   Musculoskeletal: Negative.   Skin: Negative.   Neurological: Negative.   Psychiatric/Behavioral: Negative for depression, suicidal ideas and hallucinations.    Blood pressure 113/77, pulse 83, temperature 98.6 F (37 C), temperature source Oral, resp. rate 20, height 5\' 3"  (1.6 m), weight 57.607 kg (127 lb), SpO2 100.00%.Body mass index is 22.5 kg/(m^2).  General Appearance: Casual  Eye Contact::  Good  Speech:  Normal Rate  Volume:  Normal  Mood:  Euthymic  Affect:  Congruent  Thought Process:  Goal Directed  Orientation:  Full (Time, Place, and Person)  Thought Content:  WDL  Suicidal Thoughts:  No  Homicidal Thoughts:  No  Memory:  Immediate;   Fair Recent;   Fair Remote;   Fair  Judgement:  Impaired  Insight:  Fair  Psychomotor Activity:  Normal  Concentration:  Good  Recall:  Good  Fund of Knowledge:Good  Language: Good  Akathisia:  No    AIMS (if indicated):     Assets:  Communication Skills  Sleep:  Number of Hours: 6.75    Musculoskeletal: Strength & Muscle Tone: within normal limits Gait &  Station: normal Patient leans: N/A   Mental Status Per Nursing Assessment::   On Admission:     Current Mental Status by Physician: Patient denies SI/HI/AH/VH. Patient reports wanting sign AMA ,reports that the only reason she came to [facility] was because she ran out of prescriptions and has no psychiatrist on an outpatient basis. Patient reports wanting to go to [facility] services as a walk in and would like to sign out AMA.  Loss Factors: Financial problems/change in socioeconomic status  Historical Factors: Impulsivity  Risk Reduction Factors:   Positive social support and Positive therapeutic relationship  Continued Clinical Symptoms:  Previous Psychiatric Diagnoses and Treatments  Cognitive Features That Contribute To Risk:  Closed-mindedness Polarized thinking Thought constriction (tunnel vision)    Suicide Risk:  Minimal: No identifiable suicidal ideation.  Patients presenting with no risk factors but with morbid ruminations; may be classified as minimal risk based on the severity of the depressive symptoms  Discharge Diagnoses:  DSM 5 : Primary psychiatric diagnosis: Schizoaffective disorder, bipolar type,multiple episodes ,currently in acute episode  Secondary psychiatric diagnosis: Alcohol use disorder,severe Non compliance with medication regimen  Non psychiatric diagnosis: Seizure disorder DM per hx  Migraine Asthma Hypertension  Hepatitis C  Past Medical History  Diagnosis Date  . Sciatic pain   . Schizoaffective disorder   . Hepatitis C   . Arthritis   . Asthma   . Migraine   . Allergy   . Depression   . Anxiety   . Alopecia   . Substance abuse   . Hypertension  onset age 65.  . Seizures     onset in childhood.  Generalized tonic-clonic.      Plan Of Care/Follow-up recommendations:  It was discussed with patient that she may benefit from continued stay in the hospital to help readjust her medications. Patient however reports wanting to sign  out AMA. Hospitalist consult was made to help manage her elevated LFTs as well as seizure disorder. Per hospitalist ,they had recommended Neurology referral. However ,since patient is leaving AMA ,these recommendations cannot be followed up.  Is patient on multiple antipsychotic therapies at discharge:  No   Has Patient had three or more failed trials of antipsychotic monotherapy by history:  No  Recommended Plan for Multiple Antipsychotic Therapies: NA    Shonice Wrisley MD 09/09/2014, 1:37 PM

## 2014-09-09 NOTE — [facility] Group Notes (Signed)
[facility] LCSW Group Therapy  09/09/2014 1:15 pm  Type of Therapy: Process Group Therapy  Participation Level:  Active  Participation Quality:  Appropriate  Affect:  Flat  Cognitive:  Oriented  Insight:  Improving  Engagement in Group:  Limited  Engagement in Therapy:  Limited  Modes of Intervention:  Activity, Clarification, Education, Problem-solving and Support  Summary of Progress/Problems: Today's group addressed the issue of overcoming obstacles.  Patients were asked to identify their biggest obstacle post d/c that stands in the way of their on-going success, and then problem solve as to how to manage this.  Melissa Bridges stayed for the entire group.  She was not disruptive, but declined to answer any of my questions.    Trish Mage 09/09/2014   3:26 PM

## 2014-09-09 NOTE — Progress Notes (Signed)
D: Pt has anxious affect and mood.  Pt denies SI/HI/hallucinations.  Pt interacts appropriately with staff and peers.  Pt reports her goal for the day was "not to get into it with anyone, and I've done good."   A: Medications administered per order.  Safety maintained.  PRN medications administered for anxiety and pain, see flowsheet.  Encouraged and supported pt. R: Pt verbally contracts for safety and is in no distress.  Pt is compliant with medications.  Pt attended evening group.  Will continue to monitor and assess for safety.

## 2014-09-09 NOTE — Care Management Utilization Note (Signed)
   Per State Regulation 482.30  This chart was reviewed for necessity with respect to the patient's Admission/ Duration of stay.  Next review date: 09/12/14  Skipper Cliche RN, BSN

## 2014-09-09 NOTE — Tx Team (Signed)
  Interdisciplinary Treatment Plan Update   Date Reviewed:  09/09/2014  Time Reviewed:  11:24 AM  Progress in Treatment:   Attending groups: Yes Participating in groups: Yes Taking medication as prescribed: Yes  Tolerating medication: Yes Family/Significant other contact made: Yes  Patient understands diagnosis: Yes AEB asking to be restarted on meds Discussing patient identified problems/goals with staff: Yes  See initial care plan Medical problems stabilized or resolved: Yes Denies suicidal/homicidal ideation: Yes  In tx team Patient has not harmed self or others: Yes  For review of initial/current patient goals, please see plan of care.  Estimated Length of Stay:  3-4 days  Reason for Continuation of Hospitalization: Depression Hallucinations Medication stabilization  New Problems/Goals identified:  N/A  Discharge Plan or Barriers:   return home, follow up outpt  Additional Comments:  Pt reports that she has been feeling depressed and "I drank a lot, a whole lot to try to feel better". Pt reports that her noncompliance with medication made her symptoms much worse when she ran out and that drinking was her coping mechanism. Denies SI, HI, and AVH, contracts for safety.  : Melissa Bridges is a 53 y.o. female patient admitted with paranoia and seizures. Melissa Bridges reports she recently moved from [place] to [place] to live with her daughter. About a month ago patient reports she "ran out" of medications because she has yet to find a psychiatrist in [place], and started drinking 2 fourty ounce beers everyday for the last month. Patient states this has happened in the past; she will run out of medications so she will start drinking alcohol.  Restarted on meds which are Depakote and Seroquel.   Attendees:  Signature: Steva Colder, MD 09/09/2014 11:24 AM   Signature: Ripley Fraise, LCSW 09/09/2014 11:24 AM  Signature: 09/09/2014 11:24 AM  Signature: Desma Paganini, RN 09/09/2014 11:24  AM  Signature: 09/09/2014 11:24 AM  Signature:  09/09/2014 11:24 AM  Signature:   09/09/2014 11:24 AM  Signature:    Signature:    Signature:    Signature:    Signature:    Signature:      Scribe for Treatment Team:   Ripley Fraise, LCSW  09/09/2014 11:24 AM

## 2014-09-09 NOTE — Progress Notes (Signed)
Pt signed AMA paper with order by MD.

## 2014-09-09 NOTE — [facility] Suicide Risk Assessment (Signed)
[facility] INPATIENT:  Family/Significant Other Suicide Prevention Education  Suicide Prevention Education:  Family/Significant Other Refusal to Support Patient after Discharge:  Suicide Prevention Education Not Provided:  Patient has identified home of family/significant other as the place the patient will be residing after discharge.  With written consent of the patient, two attempts were made to provide Suicide Prevention Education to [facility], daughter, [phone]  This person indicates he/she will not be responsible for the patient after discharge.  Ms Merry Proud called [place] asking for help to get placement for her mother.  "I can't manage her here anymore.  It's the same thing over and over again.  She denies there is anything wrong with her.  She refuses to take her medication, and then makes things worse by drinking alcohol.  I can't let her grandchildren continue to witness this."  I explained the need for MCD or self pay for placement, and that the patient has to be on board with the plan.   Larin and I called her daughter together after this conversation.  Ms Merry Proud requested that her mother stay until the Dr made the decision to release her.  Atara refused, and said "come pick me up."  Ms Merry Proud not only refused, but stated that if Chesney did not stay for the duration of her hospitalization, she was not welcome at home.  Adriel left the room.  She was subsequently discharged without telling me her plan for boarding.  Ms Merry Proud stated that her mother gets her check on the 30th, and she becomes particularly belligerent when it gets close to check day.  Roque Lias B 09/09/2014,3:42 PM

## 2014-09-09 NOTE — Progress Notes (Signed)
Pt d/c from the hospital. All items returned. Pt denies si and hi.

## 2014-09-09 NOTE — Plan of Care (Signed)
Problem: Alteration in thought process Goal: LTG-Patient has not harmed self or others in at least 2 days Outcome: Completed/Met Date Met:  09/09/14 Pt has not been aggressive to self or others for the past 2 days.   Goal: STG-Patient does not respond to command hallucinations Outcome: Progressing Pt denied having hallucinations this shift.

## 2014-09-11 MED ORDER — QUETIAPINE FUMARATE 300 MG PO TABS: 300.0000 mg | ORAL_TABLET | Freq: Every day | ORAL | Status: DC

## 2014-09-11 MED ORDER — AMLODIPINE BESYLATE 5 MG PO TABS: 5.0000 mg | ORAL_TABLET | Freq: Every day | ORAL | Status: DC

## 2014-09-11 MED ORDER — ALBUTEROL SULFATE HFA 108 (90 BASE) MCG/ACT IN AERS: 2.0000 | INHALATION_SPRAY | RESPIRATORY_TRACT | Status: DC | PRN

## 2014-09-11 MED ORDER — BECLOMETHASONE DIPROPIONATE 40 MCG/ACT IN AERS: 2.0000 | INHALATION_SPRAY | Freq: Two times a day (BID) | RESPIRATORY_TRACT | Status: DC

## 2014-09-11 MED ORDER — CITALOPRAM HYDROBROMIDE 10 MG PO TABS: 10.0000 mg | ORAL_TABLET | Freq: Every day | ORAL | Status: DC

## 2014-09-11 MED ORDER — MELOXICAM 15 MG PO TABS: 15.0000 mg | ORAL_TABLET | Freq: Every day | ORAL | Status: DC

## 2014-09-11 MED ORDER — BUDESONIDE-FORMOTEROL FUMARATE 80-4.5 MCG/ACT IN AERO: 2.0000 | INHALATION_SPRAY | Freq: Every day | RESPIRATORY_TRACT | Status: DC | PRN

## 2014-09-11 MED ORDER — DIVALPROEX SODIUM ER 500 MG PO TB24: 1000.0000 mg | ORAL_TABLET | Freq: Every day | ORAL | Status: DC

## 2014-09-11 NOTE — Discharge Summary (Signed)
Physician Discharge Summary Note  Patient:  Melissa Bridges is an 53 y.o., female MRN:  [id number] DOB:  1961/05/13 Patient phone:  [phone] (home)  Patient address:   [place] [address],  Total Time spent with patient: 20 minutes  Date of Admission:  09/06/2014 Date of Discharge: 09/09/14  Reason for Admission:  Mood stabilization   Discharge Diagnoses: Active Problems:   Schizoaffective disorder   Elevated LFTs   Seizure disorder  Psychiatric Specialty Exam: Physical Exam  Psychiatric: She has a normal mood and affect. Her speech is normal and behavior is normal. Judgment and thought content normal. Cognition and memory are normal.    Review of Systems  Constitutional: Negative.   HENT: Negative.   Eyes: Negative.   Respiratory: Negative.   Cardiovascular: Negative.   Gastrointestinal: Negative.   Genitourinary: Negative.   Musculoskeletal: Negative.   Skin: Negative.   Neurological: Negative.   Endo/Heme/Allergies: Negative.   Psychiatric/Behavioral: Substance abuse: Stable     Blood pressure 113/77, pulse 83, temperature 98.6 F (37 C), temperature source Oral, resp. rate 20, height 5\' 3"  (1.6 m), weight 57.607 kg (127 lb), SpO2 100.00%.Body mass index is 22.5 kg/(m^2).   General Appearance: Casual   Eye Contact:: Good   Speech: Normal Rate   Volume: Normal   Mood: Euthymic   Affect: Congruent   Thought Process: Goal Directed   Orientation: Full (Time, Place, and Person)   Thought Content: WDL   Suicidal Thoughts: No   Homicidal Thoughts: No   Memory: Immediate; Fair  Recent; Fair  Remote; Fair   Judgement: Impaired   Insight: Fair   Psychomotor Activity: Normal   Concentration: Good   Recall: Good   Fund of Knowledge:Good   Language: Good   Akathisia: No     AIMS (if indicated):   Assets: Communication Skills   Sleep: Number of Hours: 6.75    Musculoskeletal: Strength & Muscle Tone: within normal limits Gait & Station:  normal Patient leans: N/A  DSM5:  Axis Diagnosis:   Primary psychiatric diagnosis:  Schizoaffective disorder, bipolar type,multiple episodes ,currently in acute episode  Secondary psychiatric diagnosis:  Alcohol use disorder,severe  Non compliance with medication regimen  Non psychiatric diagnosis:  Seizure disorder  DM per hx  Migraine  Asthma  Hypertension  Hepatitis C  Past Medical History   Diagnosis  Date   .  Sciatic pain    .  Schizoaffective disorder    .  Hepatitis C    .  Arthritis    .  Asthma    .  Migraine    .  Allergy    .  Depression    .  Anxiety    .  Alopecia    .  Substance abuse    .  Hypertension      onset age 19.   .  Seizures      onset in childhood. Generalized tonic-clonic.    Level of Care:  OP  Hospital Course:  Melissa Bridges is a 53 y.o. female patient admitted with paranoia and seizures on 09/07/14. Melissa Bridges reports she recently moved from [place] to [place] to live with her daughter. About a month ago patient reports she "ran out" of medications because she has yet to find a psychiatrist in [place], and started drinking 2 fourty ounce beers everyday for the last month. Patient states this has happened in the past; she will run out of medications so she will start drinking alcohol. Ms.  Bridges states she had a seizure since she ran out of medications this past month. Patient reports in the past she has come off her medications on her own when she starts to "feel better". Patient currently endorses AVH, stating "somebody is trying to get me", displays paranoid behavior, but denies SI/HI. She was admitted to for mood stabilization treatments.  However, subsequent follow-up care by Dr. Shea Evans is whereby Melissa Bridges decided to go home against medical advice. It was discussed with patient that she may benefit from continued stay in the hospital to help readjust her medications. Patient however reports wanting to sign out AMA. Hospitalist  consult was made to help manage her elevated LFTs as well as seizure disorder. Per hospitalist, they had recommended Neurology referral. And because Melissa Bridges is currently leaving the hospital AMA, these recommendations cannot be followed up.  Consults:  Internal Medicine for elevated liver enzymes   Significant Diagnostic Studies:  Chemistry panel with elevated AST/ALT, CBC, Depakote level, UA, UDS positive for marijuana  Discharge Vitals:   Blood pressure 113/77, pulse 83, temperature 98.6 F (37 C), temperature source Oral, resp. rate 20, height 5\' 3"  (1.6 m), weight 57.607 kg (127 lb), SpO2 100.00%. Body mass index is 22.5 kg/(m^2). Lab Results:   No results found for this or any previous visit (from the past 72 hour(s)).  Physical Findings: AIMS:  , ,  ,  ,    CIWA:    COWS:     Psychiatric Specialty Exam: See Psychiatric Specialty Exam and Suicide Risk Assessment completed by Attending Physician prior to discharge.  Discharge destination:  Home  Is patient on multiple antipsychotic therapies at discharge:  No   Has Patient had three or more failed trials of antipsychotic monotherapy by history:  No  Recommended Plan for Multiple Antipsychotic Therapies: NA    Medication List    STOP taking these medications       HYDROcodone-acetaminophen 5-325 MG per tablet  Commonly known as:  NORCO/VICODIN      TAKE these medications     Indication   albuterol 108 (90 BASE) MCG/ACT inhaler  Commonly known as:  PROVENTIL HFA;VENTOLIN HFA  Inhale 2 puffs into the lungs every 4 (four) hours as needed for shortness of breath.   Indication:  Asthma     amLODipine 5 MG tablet  Commonly known as:  NORVASC  Take 1 tablet (5 mg total) by mouth daily. High blood pressure   Indication:  High Blood Pressure     beclomethasone 40 MCG/ACT inhaler  Commonly known as:  QVAR  Inhale 2 puffs into the lungs 2 (two) times daily. Asthma   Indication:  Asthma     budesonide-formoterol 80-4.5  MCG/ACT inhaler  Commonly known as:  SYMBICORT  Inhale 2 puffs into the lungs daily as needed (for shortness of breath).   Indication:  Asthma     citalopram 10 MG tablet  Commonly known as:  CELEXA  Take 1 tablet (10 mg total) by mouth daily. For depression   Indication:  Depression     divalproex 500 MG 24 hr tablet  Commonly known as:  DEPAKOTE ER  Take 2 tablets (1,000 mg total) by mouth daily. For mood stabilization   Indication:  Mood stabilization     meloxicam 15 MG tablet  Commonly known as:  MOBIC  Take 1 tablet (15 mg total) by mouth daily. For arthritis   Indication:  Joint Damage causing Pain and Loss of Function     QUEtiapine  300 MG tablet  Commonly known as:  SEROQUEL  Take 1 tablet (300 mg total) by mouth at bedtime. Mood control   Indication:  mood control       Follow-up Information   Follow up with SMITH,KRISTI, MD In 1 week.   Specialty:  Family Medicine   Contact information:   [place] [address] [phone]       Follow up with [facility]    . Schedule an appointment as soon as possible for a visit in 2 weeks.   Contact information:   [place] [address] [phone]      Follow up with [facility]. Schedule an appointment as soon as possible for a visit in 3 weeks.   Specialty:  Neurology   Contact information:   [address] [place], [place] [address] [phone]      Follow up with [facility]. (You will need to call for a follow up appointment for psychiatry and counseling.  I was unable to schedule one for you before you left.)    Contact information:   [place] [phone]     Comments:   Take all your medications as prescribed by your original mental healthcare provider.  Report any adverse effects and or reactions from your medicines to your outpatient provider promptly.  Patient is instructed and  cautioned to not engage in alcohol and or illegal drug use while on prescription medicines.  In the event of worsening symptoms, patient is instructed to call the crisis hotline, 911 and or go to the nearest ED for appropriate evaluation and treatment of symptoms.  Follow-up with your primary care provider for your other medical issues, concerns and or health care needs.   Total Discharge Time:  Greater than 30 minutes.  SignedLindell Spar I NP-C 09/11/2014, 3:19 PM

## 2014-09-11 NOTE — Discharge Summary (Signed)
Discharge summary (patient signed out AMA)    Melissa Bridges is a 53 y.o. female patient admitted with paranoia and seizures. [name] reported she recently moved from [place] to [place] to live with her daughter. About a month ago patient reports she "ran out" of medications because she has yet to find a psychiatrist in [place], and started drinking 2 fourty ounce beers everyday for the last month. Patient stated this has happened in the past; she will run out of medications so she will start drinking alcohol. [name] stated she had a seizure since she ran out of medications this past month. Patient reported in the past she has come off her medications on her own when she starts to "feel better". Patent on admission endorsed AH ,however denied SI/HI.  A hospitalist consult was placed to manage her seizure disorder.  Patient however on subsequent follow up care ,decided to sign out against medical advice. Patient was encouraged to stay on medication as recommended by her previous provider . Patient did not want to follow up with their recommendations.  Patient was encouraged to follow up with outpatient provider.  Patient when she left the hospital ,was not suicidal,homicidal and did not report AH/VH.  Ursula Alert ,MD Attending [facility]

## 2014-09-12 NOTE — Discharge Summary (Signed)
Patient was seen face to face for psychiatric evaluation, suicide risk assessment and case discussed with treatment team and NP and made appropriate disposition plans. Reviewed the information documented and agree with the treatment plan.   Chanice Brenton ,MD Attending Psychiatrist  [facility]    

## 2014-09-12 NOTE — Progress Notes (Signed)
Patient Discharge Instructions:  After Visit Summary (AVS):   Faxed to:  09/12/14 Discharge Summary Note:   Faxed to:  09/12/14 Psychiatric Admission Assessment Note:   Faxed to:  09/12/14 Suicide Risk Assessment - Discharge Assessment:   Faxed to:  09/12/14 Faxed/Sent to the Next Level Care provider:  09/12/14 Next Level Care Provider Has Access to the EMR, 09/12/14 Faxed to [facility] @ [phone] Records provided to [place] via [facility]/Epic access.  Patsey Berthold, 09/12/2014, 3:29 PM

## 2014-10-06 DIAGNOSIS — M542 Cervicalgia: Secondary | ICD-10-CM | POA: Diagnosis not present

## 2014-12-21 ENCOUNTER — Ambulatory Visit (INDEPENDENT_AMBULATORY_CARE_PROVIDER_SITE_OTHER): Payer: Medicare Other | Admitting: Family Medicine

## 2014-12-21 VITALS — BP 108/72 | HR 77 | Temp 97.4°F | Resp 19 | Ht 63.5 in | Wt 117.2 lb

## 2014-12-21 DIAGNOSIS — M5441 Lumbago with sciatica, right side: Secondary | ICD-10-CM

## 2014-12-21 DIAGNOSIS — M545 Low back pain, unspecified: Secondary | ICD-10-CM

## 2014-12-21 DIAGNOSIS — F250 Schizoaffective disorder, bipolar type: Secondary | ICD-10-CM | POA: Diagnosis not present

## 2014-12-21 DIAGNOSIS — M79604 Pain in right leg: Secondary | ICD-10-CM

## 2014-12-21 DIAGNOSIS — R300 Dysuria: Secondary | ICD-10-CM

## 2014-12-21 DIAGNOSIS — J441 Chronic obstructive pulmonary disease with (acute) exacerbation: Secondary | ICD-10-CM

## 2014-12-21 DIAGNOSIS — I10 Essential (primary) hypertension: Secondary | ICD-10-CM

## 2014-12-21 LAB — POCT URINALYSIS DIPSTICK
BILIRUBIN UA: NEGATIVE
GLUCOSE UA: NEGATIVE
KETONES UA: NEGATIVE
NITRITE UA: NEGATIVE
PROTEIN UA: NEGATIVE
Spec Grav, UA: <=1.005
UROBILINOGEN UA: 0.2
pH, UA: 7

## 2014-12-21 LAB — POCT UA - MICROSCOPIC ONLY
Bacteria, U Microscopic: NEGATIVE
CASTS, UR, LPF, POC: NEGATIVE
Crystals, Ur, HPF, POC: NEGATIVE
Epithelial cells, urine per micros: NEGATIVE
Mucus, UA: NEGATIVE
Yeast, UA: NEGATIVE

## 2014-12-21 MED ORDER — DIVALPROEX SODIUM ER 500 MG PO TB24: 1000.0000 mg | ORAL_TABLET | Freq: Every day | ORAL | Status: DC

## 2014-12-21 MED ORDER — AMLODIPINE BESYLATE 5 MG PO TABS: 5.0000 mg | ORAL_TABLET | Freq: Every day | ORAL | Status: DC

## 2014-12-21 MED ORDER — HYDROCODONE-ACETAMINOPHEN 5-325 MG PO TABS: 1.0000 | ORAL_TABLET | Freq: Four times a day (QID) | ORAL | Status: DC | PRN

## 2014-12-21 MED ORDER — QUETIAPINE FUMARATE 300 MG PO TABS: 300.0000 mg | ORAL_TABLET | Freq: Every day | ORAL | Status: DC

## 2014-12-21 MED ORDER — CITALOPRAM HYDROBROMIDE 10 MG PO TABS: 10.0000 mg | ORAL_TABLET | Freq: Every day | ORAL | Status: DC

## 2014-12-21 MED ORDER — BUDESONIDE-FORMOTEROL FUMARATE 80-4.5 MCG/ACT IN AERO: 2.0000 | INHALATION_SPRAY | Freq: Every day | RESPIRATORY_TRACT | Status: DC | PRN

## 2014-12-21 NOTE — Progress Notes (Signed)
Subjective: 54 year old lady who is here for a couple things. She's been having some dysuria and urinary frequency for the past week. She has low back pain radiating down her right leg. She has a history of previous lumbar radiculopathy. She had an MRI in the past a year and half ago showing some disc disease and possible nerve compression. She was doing okay until last few days she's been hurting with pain over down to her right foot. This is not related with the urine symptoms. She has a history of a schizoaffective disorder and is on psychotropic medications. Dr. Tamala Julian told her she needs to a psychiatrist and she apparently spends half her time here and have her time down in [facility] with her parents. When her psychiatric condition gets bad for her daughter makes her move away. The patient says she only drinks some beer when she is out of her medications. She has also been told that she may need to find a chronic pain doctor.  Objective: No major acute distress. Abdomen is soft. Normal bowel sounds. No CVA tenderness in this. Is tender in the low lumbar region. Straight leg raising positive at about 40 on the right common negative on the left. Gait normal. Mental status seems fairly normal today. Her granddaughter accompany her here today.  Assessment: History of COPD Dysuria History of schizoaffective disorder History of depression History of bipolar Right lumbar radiculopathy with history of lumbar disc disease History of hypertension  Plan: Refill her medications. Urged her to go to a pain clinic if she continues to have chronic back. Told her to see a psychiatrist, that we cannot keep: Represcribed her psychotropic medicines forever.  Check urinalysis  Treat the sciatic go pains time and pain medication. Decided to avoid prednisone because of the skin just refractive problems that might be made worse by anything this. However if pain gets to bed she may need to be  tried on this.  Results for orders placed or performed in visit on 12/21/14  POCT UA - Microscopic Only  Result Value Ref Range   WBC, Ur, HPF, POC 0-4    RBC, urine, microscopic 0-1    Bacteria, U Microscopic neg    Mucus, UA neg    Epithelial cells, urine per micros neg    Crystals, Ur, HPF, POC neg    Casts, Ur, LPF, POC neg    Yeast, UA neg   POCT urinalysis dipstick  Result Value Ref Range   Color, UA yellow    Clarity, UA clear    Glucose, UA neg    Bilirubin, UA neg    Ketones, UA neg    Spec Grav, UA <=1.005    Blood, UA trace    pH, UA 7.0    Protein, UA neg    Urobilinogen, UA 0.2    Nitrite, UA neg    Leukocytes, UA small (1+)

## 2014-12-21 NOTE — Addendum Note (Signed)
Addended by: Constance Goltz on: 12/21/2014 03:08 PM   Modules accepted: Orders, SmartSet

## 2014-12-21 NOTE — Progress Notes (Signed)
   Subjective:    Patient ID: Melissa Bridges, female    DOB: 1961-09-22, 54 y.o.   MRN: [id number]  HPI    Review of Systems     Objective:   Physical Exam        Assessment & Plan:

## 2014-12-21 NOTE — Patient Instructions (Addendum)
We will make a referral to psychiatry and a referral to pain clinic.  Continue current medications for now  Pain pills if needed for severe back pain. If you continue to have a lot of bad back pain we may need to refer you to a back specialist. For milder pain take ibuprofen or Tylenol. Save the pain pills for when you're hurting badly.  Use some topical medications like BenGay to try and get relief. Apply ice for about 15 minutes followed by heat for about 15 minutes. To this for 5 times a day if you're hurting.  Avoid heavy lifting or prolonged time in your feet.  Return if not improving

## 2014-12-21 NOTE — Addendum Note (Signed)
Addended by: Madie Reno on: 12/21/2014 02:18 PM   Modules accepted: Orders

## 2014-12-23 LAB — URINE CULTURE: Colony Count: 25000

## 2015-01-27 ENCOUNTER — Ambulatory Visit (INDEPENDENT_AMBULATORY_CARE_PROVIDER_SITE_OTHER): Payer: Medicare Other | Admitting: Family Medicine

## 2015-01-27 ENCOUNTER — Ambulatory Visit (INDEPENDENT_AMBULATORY_CARE_PROVIDER_SITE_OTHER): Payer: Medicare Other

## 2015-01-27 VITALS — BP 110/64 | HR 100 | Temp 97.8°F | Resp 20 | Ht 63.5 in | Wt 121.0 lb

## 2015-01-27 DIAGNOSIS — R05 Cough: Secondary | ICD-10-CM

## 2015-01-27 DIAGNOSIS — J441 Chronic obstructive pulmonary disease with (acute) exacerbation: Secondary | ICD-10-CM | POA: Diagnosis not present

## 2015-01-27 DIAGNOSIS — R0602 Shortness of breath: Secondary | ICD-10-CM

## 2015-01-27 DIAGNOSIS — M5431 Sciatica, right side: Secondary | ICD-10-CM

## 2015-01-27 DIAGNOSIS — R059 Cough, unspecified: Secondary | ICD-10-CM

## 2015-01-27 DIAGNOSIS — R062 Wheezing: Secondary | ICD-10-CM | POA: Diagnosis not present

## 2015-01-27 LAB — POCT CBC
Granulocyte percent: 69.7 %G (ref 37–80)
HEMATOCRIT: 32 % — AB (ref 37.7–47.9)
HEMOGLOBIN: 9.9 g/dL — AB (ref 12.2–16.2)
LYMPH, POC: 2.2 (ref 0.6–3.4)
MCH: 24.5 pg — AB (ref 27–31.2)
MCHC: 30.9 g/dL — AB (ref 31.8–35.4)
MCV: 79.3 fL — AB (ref 80–97)
MID (CBC): 0.3 (ref 0–0.9)
MPV: 7.6 fL (ref 0–99.8)
POC Granulocyte: 5.8 (ref 2–6.9)
POC LYMPH %: 26.5 % (ref 10–50)
POC MID %: 3.8 % (ref 0–12)
Platelet Count, POC: 282 10*3/uL (ref 142–424)
RBC: 4.03 M/uL — AB (ref 4.04–5.48)
RDW, POC: 20.4 %
WBC: 8.3 10*3/uL (ref 4.6–10.2)

## 2015-01-27 MED ORDER — PREDNISONE 20 MG PO TABS: 20.0000 mg | ORAL_TABLET | Freq: Every day | ORAL | Status: DC

## 2015-01-27 MED ORDER — CEFDINIR 300 MG PO CAPS: 300.0000 mg | ORAL_CAPSULE | Freq: Two times a day (BID) | ORAL | Status: DC

## 2015-01-27 MED ORDER — ALBUTEROL SULFATE (2.5 MG/3ML) 0.083% IN NEBU
2.5000 mg | INHALATION_SOLUTION | Freq: Once | RESPIRATORY_TRACT | Status: AC
  Administered 2015-01-27: 2.5 mg via RESPIRATORY_TRACT

## 2015-01-27 NOTE — Patient Instructions (Addendum)
When you use inhalers blowout all you are, insert the inhaler in your mouth, and spray as you inhale, sucking the maximum amount of the medicine into your lungs. The and hold it for 10 seconds before releasing it. It is best to wait a couple of minutes between puffs.  Continue your Symbicort  Take the prednisone 20 mg 3 pills daily for 2 days  Take the Omnicef (cefdinir) antibiotic one pill twice daily for infection  Use the albuterol inhaler 2 inhalations every 4-6 hours as needed for wheezing or shortness of breath (you have refills on file, I believe you had the Pro-Air brand. Ask the pharmacist for a new one if you do not have any.)  In the event of getting shorter of breath return or go to the emergency room if necessary

## 2015-01-27 NOTE — Progress Notes (Signed)
Subjective: Patient has been having a problem with pulmonary congestion for the past 2 weeks. She has been coughing and short of breath. His gotten worse last few days. She says it is usually not this bad. She smokes about a third pack of cigarettes a day still. She uses Symbicort. She is on a number of medications, please refer to list. She is not coughing up a lot of phlegm but cough up some. She aches in the left side of her chest. She has a history of right-sided sciatica, and it is been doing worse.  Objective: Coughing a lot and short of breath. O2 saturation was 97%. Her throat is clear. TMs normal. Neck supple without nodes. Chest without rhonchi, rales. She does have some soft expiratory wheezing. Chest wall is tender, more on the left. Heart regular without murmurs but a little tachycardic. Peak flow. Considerably on the 3 tests, with 300 being the best, 250 next best.  Assessment: COPD with exacerbation Tobacco use disorder Cough Wheezing Sciatica right-sided  Plan: Chest x-ray and CBC Albuterol nebulizer  She has no evidence of any pneumonia. This appears just to be a COPD with exacerbation. The coughing has aggravated her sciatic I am sure. Taking steroids would help both  [facility] reading (PRIMARY) by  Dr. Linna Darner Copd.  No active infiltrate noted    Results for orders placed or performed in visit on 01/27/15  POCT CBC  Result Value Ref Range   WBC 8.3 4.6 - 10.2 K/uL   Lymph, poc 2.2 0.6 - 3.4   POC LYMPH PERCENT 26.5 10 - 50 %L   MID (cbc) 0.3 0 - 0.9   POC MID % 3.8 0 - 12 %M   POC Granulocyte 5.8 2 - 6.9   Granulocyte percent 69.7 37 - 80 %G   RBC 4.03 (A) 4.04 - 5.48 M/uL   Hemoglobin 9.9 (A) 12.2 - 16.2 g/dL   HCT, POC 32.0 (A) 37.7 - 47.9 %   MCV 79.3 (A) 80 - 97 fL   MCH, POC 24.5 (A) 27 - 31.2 pg   MCHC 30.9 (A) 31.8 - 35.4 g/dL   RDW, POC 20.4 %   Platelet Count, POC 282 142 - 424 K/uL   MPV 7.6 0 - 99.8 fL   Follow-up peak flow was 330 at best. However  she felt much better.  She has a psychiatric problem and could not take azithromycin that I started to order because of drug drug interaction. We'll give Omnicef. She has an albuterol inhaler on file but apparently doesn't have one. I told her to get it. Told her how to use a inhaler properly. She is to return if in all worse.

## 2015-03-09 ENCOUNTER — Telehealth: Payer: Self-pay

## 2015-03-09 NOTE — Telephone Encounter (Signed)
Received fax from [facility] saying they will not pay for long term use of Symbicort. Per Dr. Linna Darner, advised to RTC to discuss other pt's. Pt will do this and in the meantime, call her ins company to see what they will pay for.

## 2015-03-30 ENCOUNTER — Ambulatory Visit (INDEPENDENT_AMBULATORY_CARE_PROVIDER_SITE_OTHER): Payer: Medicare Other | Admitting: Family Medicine

## 2015-03-30 VITALS — BP 112/68 | HR 109 | Temp 97.6°F | Resp 18 | Ht 64.0 in | Wt 126.0 lb

## 2015-03-30 DIAGNOSIS — D509 Iron deficiency anemia, unspecified: Secondary | ICD-10-CM

## 2015-03-30 DIAGNOSIS — M5441 Lumbago with sciatica, right side: Secondary | ICD-10-CM | POA: Diagnosis not present

## 2015-03-30 DIAGNOSIS — B182 Chronic viral hepatitis C: Secondary | ICD-10-CM | POA: Diagnosis not present

## 2015-03-30 DIAGNOSIS — Z79899 Other long term (current) drug therapy: Secondary | ICD-10-CM

## 2015-03-30 DIAGNOSIS — J4530 Mild persistent asthma, uncomplicated: Secondary | ICD-10-CM | POA: Diagnosis not present

## 2015-03-30 DIAGNOSIS — R1031 Right lower quadrant pain: Secondary | ICD-10-CM

## 2015-03-30 DIAGNOSIS — M5431 Sciatica, right side: Secondary | ICD-10-CM | POA: Diagnosis not present

## 2015-03-30 DIAGNOSIS — I10 Essential (primary) hypertension: Secondary | ICD-10-CM

## 2015-03-30 DIAGNOSIS — Z1231 Encounter for screening mammogram for malignant neoplasm of breast: Secondary | ICD-10-CM | POA: Diagnosis not present

## 2015-03-30 DIAGNOSIS — Z1239 Encounter for other screening for malignant neoplasm of breast: Secondary | ICD-10-CM | POA: Diagnosis not present

## 2015-03-30 DIAGNOSIS — J4531 Mild persistent asthma with (acute) exacerbation: Secondary | ICD-10-CM | POA: Diagnosis not present

## 2015-03-30 DIAGNOSIS — Z1211 Encounter for screening for malignant neoplasm of colon: Secondary | ICD-10-CM

## 2015-03-30 LAB — POCT UA - MICROSCOPIC ONLY
Bacteria, U Microscopic: NEGATIVE
CRYSTALS, UR, HPF, POC: NEGATIVE
Casts, Ur, LPF, POC: NEGATIVE
MUCUS UA: NEGATIVE
RBC, urine, microscopic: NEGATIVE
WBC, Ur, HPF, POC: NEGATIVE
YEAST UA: NEGATIVE

## 2015-03-30 LAB — POCT CBC
Granulocyte percent: 61.9 %G (ref 37–80)
HEMATOCRIT: 33 % — AB (ref 37.7–47.9)
HEMOGLOBIN: 10.2 g/dL — AB (ref 12.2–16.2)
Lymph, poc: 2.3 (ref 0.6–3.4)
MCH, POC: 23.7 pg — AB (ref 27–31.2)
MCHC: 30.9 g/dL — AB (ref 31.8–35.4)
MCV: 76.8 fL — AB (ref 80–97)
MID (cbc): 0.5 (ref 0–0.9)
MPV: 7.7 fL (ref 0–99.8)
POC Granulocyte: 4.5 (ref 2–6.9)
POC LYMPH %: 31.4 % (ref 10–50)
POC MID %: 6.7 %M (ref 0–12)
Platelet Count, POC: 303 10*3/uL (ref 142–424)
RBC: 4.29 M/uL (ref 4.04–5.48)
RDW, POC: 21.3 %
WBC: 7.2 10*3/uL (ref 4.6–10.2)

## 2015-03-30 LAB — POCT URINALYSIS DIPSTICK
Bilirubin, UA: NEGATIVE
Blood, UA: NEGATIVE
Glucose, UA: NEGATIVE
Ketones, UA: NEGATIVE
Leukocytes, UA: NEGATIVE
Nitrite, UA: NEGATIVE
Protein, UA: 30
Spec Grav, UA: 1.02
UROBILINOGEN UA: 0.2
pH, UA: 7

## 2015-03-30 MED ORDER — MELOXICAM 15 MG PO TABS: 15.0000 mg | ORAL_TABLET | Freq: Every day | ORAL | Status: DC

## 2015-03-30 MED ORDER — FERROUS SULFATE 325 (65 FE) MG PO TABS: 325.0000 mg | ORAL_TABLET | Freq: Every day | ORAL | Status: DC

## 2015-03-30 MED ORDER — BECLOMETHASONE DIPROPIONATE 40 MCG/ACT IN AERS: INHALATION_SPRAY | RESPIRATORY_TRACT | Status: DC

## 2015-03-30 MED ORDER — TIZANIDINE HCL 2 MG PO CAPS: 2.0000 mg | ORAL_CAPSULE | Freq: Three times a day (TID) | ORAL | Status: DC | PRN

## 2015-03-30 MED ORDER — ALBUTEROL SULFATE HFA 108 (90 BASE) MCG/ACT IN AERS
2.0000 | INHALATION_SPRAY | Freq: Four times a day (QID) | RESPIRATORY_TRACT | Status: DC | PRN
Start: 2015-03-30 — End: 2015-09-29

## 2015-03-30 NOTE — Patient Instructions (Addendum)
[facility]   [phone]   ---  Call for transportation to appointments.     Sciatica with Rehab The sciatic nerve runs from the back down the leg and is responsible for sensation and control of the muscles in the back (posterior) side of the thigh, lower leg, and foot. Sciatica is a condition that is characterized by inflammation of this nerve.  SYMPTOMS   Signs of nerve damage, including numbness and/or weakness along the posterior side of the lower extremity.  Pain in the back of the thigh that may also travel down the leg.  Pain that worsens when sitting for long periods of time.  Occasionally, pain in the back or buttock. CAUSES  Inflammation of the sciatic nerve is the cause of sciatica. The inflammation is due to something irritating the nerve. Common sources of irritation include:  Sitting for long periods of time.  Direct trauma to the nerve.  Arthritis of the spine.  Herniated or ruptured disk.  Slipping of the vertebrae (spondylolisthesis).  Pressure from soft tissues, such as muscles or ligament-like tissue (fascia). RISK INCREASES WITH:  Sports that place pressure or stress on the spine (football or weightlifting).  Poor strength and flexibility.  Failure to warm up properly before activity.  Family history of low back pain or disk disorders.  Previous back injury or surgery.  Poor body mechanics, especially when lifting, or poor posture. PREVENTION   Warm up and stretch properly before activity.  Maintain physical fitness:  Strength, flexibility, and endurance.  Cardiovascular fitness.  Learn and use proper technique, especially with posture and lifting. When possible, have coach correct improper technique.  Avoid activities that place stress on the spine. PROGNOSIS If treated properly, then sciatica usually resolves within 6 weeks. However, occasionally surgery is necessary.  RELATED COMPLICATIONS    Permanent nerve damage, including pain, numbness, tingle, or weakness.  Chronic back pain.  Risks of surgery: infection, bleeding, nerve damage, or damage to surrounding tissues. TREATMENT Treatment initially involves resting from any activities that aggravate your symptoms. The use of ice and medication may help reduce pain and inflammation. The use of strengthening and stretching exercises may help reduce pain with activity. These exercises may be performed at home or with referral to a therapist. A therapist may recommend further treatments, such as transcutaneous electronic nerve stimulation (TENS) or ultrasound. Your caregiver may recommend corticosteroid injections to help reduce inflammation of the sciatic nerve. If symptoms persist despite non-surgical (conservative) treatment, then surgery may be recommended. MEDICATION  If pain medication is necessary, then nonsteroidal anti-inflammatory medications, such as aspirin and ibuprofen, or other minor pain relievers, such as acetaminophen, are often recommended.  Do not take pain medication for 7 days before surgery.  Prescription pain relievers may be given if deemed necessary by your caregiver. Use only as directed and only as much as you need.  Ointments applied to the skin may be helpful.  Corticosteroid injections may be given by your caregiver. These injections should be reserved for the most serious cases, because they may only be given a certain number of times. HEAT AND COLD  Cold treatment (icing) relieves pain and reduces inflammation. Cold treatment should be applied for 10 to 15 minutes every 2 to 3 hours for inflammation and pain and immediately after any activity that aggravates your symptoms. Use ice packs or massage the area with a piece of ice (ice massage).  Heat treatment may be used prior to performing the stretching  and strengthening activities prescribed by your caregiver, physical therapist, or athletic trainer.  Use a heat pack or soak the injury in warm water. SEEK MEDICAL CARE IF:  Treatment seems to offer no benefit, or the condition worsens.  Any medications produce adverse side effects. EXERCISES  RANGE OF MOTION (ROM) AND STRETCHING EXERCISES - Sciatica Most people with sciatic will find that their symptoms worsen with either excessive bending forward (flexion) or arching at the low back (extension). The exercises which will help resolve your symptoms will focus on the opposite motion. Your physician, physical therapist or athletic trainer will help you determine which exercises will be most helpful to resolve your low back pain. Do not complete any exercises without first consulting with your clinician. Discontinue any exercises which worsen your symptoms until you speak to your clinician. If you have pain, numbness or tingling which travels down into your buttocks, leg or foot, the goal of the therapy is for these symptoms to move closer to your back and eventually resolve. Occasionally, these leg symptoms will get better, but your low back pain may worsen; this is typically an indication of progress in your rehabilitation. Be certain to be very alert to any changes in your symptoms and the activities in which you participated in the 24 hours prior to the change. Sharing this information with your clinician will allow him/her to most efficiently treat your condition. These exercises may help you when beginning to rehabilitate your injury. Your symptoms may resolve with or without further involvement from your physician, physical therapist or athletic trainer. While completing these exercises, remember:   Restoring tissue flexibility helps normal motion to return to the joints. This allows healthier, less painful movement and activity.  An effective stretch should be held for at least 30 seconds.  A stretch should never be painful. You should only feel a gentle lengthening or release in the stretched  tissue. FLEXION RANGE OF MOTION AND STRETCHING EXERCISES: STRETCH - Flexion, Single Knee to Chest   Lie on a firm bed or floor with both legs extended in front of you.  Keeping one leg in contact with the floor, bring your opposite knee to your chest. Hold your leg in place by either grabbing behind your thigh or at your knee.  Pull until you feel a gentle stretch in your low back. Hold __________ seconds.  Slowly release your grasp and repeat the exercise with the opposite side. Repeat __________ times. Complete this exercise __________ times per day.  STRETCH - Flexion, Double Knee to Chest  Lie on a firm bed or floor with both legs extended in front of you.  Keeping one leg in contact with the floor, bring your opposite knee to your chest.  Tense your stomach muscles to support your back and then lift your other knee to your chest. Hold your legs in place by either grabbing behind your thighs or at your knees.  Pull both knees toward your chest until you feel a gentle stretch in your low back. Hold __________ seconds.  Tense your stomach muscles and slowly return one leg at a time to the floor. Repeat __________ times. Complete this exercise __________ times per day.  STRETCH - Low Trunk Rotation   Lie on a firm bed or floor. Keeping your legs in front of you, bend your knees so they are both pointed toward the ceiling and your feet are flat on the floor.  Extend your arms out to the side. This will stabilize your  upper body by keeping your shoulders in contact with the floor.  Gently and slowly drop both knees together to one side until you feel a gentle stretch in your low back. Hold for __________ seconds.  Tense your stomach muscles to support your low back as you bring your knees back to the starting position. Repeat the exercise to the other side. Repeat __________ times. Complete this exercise __________ times per day  EXTENSION RANGE OF MOTION AND FLEXIBILITY  EXERCISES: STRETCH - Extension, Prone on Elbows  Lie on your stomach on the floor, a bed will be too soft. Place your palms about shoulder width apart and at the height of your head.  Place your elbows under your shoulders. If this is too painful, stack pillows under your chest.  Allow your body to relax so that your hips drop lower and make contact more completely with the floor.  Hold this position for __________ seconds.  Slowly return to lying flat on the floor. Repeat __________ times. Complete this exercise __________ times per day.  RANGE OF MOTION - Extension, Prone Press Ups  Lie on your stomach on the floor, a bed will be too soft. Place your palms about shoulder width apart and at the height of your head.  Keeping your back as relaxed as possible, slowly straighten your elbows while keeping your hips on the floor. You may adjust the placement of your hands to maximize your comfort. As you gain motion, your hands will come more underneath your shoulders.  Hold this position __________ seconds.  Slowly return to lying flat on the floor. Repeat __________ times. Complete this exercise __________ times per day.  STRENGTHENING EXERCISES - Sciatica  These exercises may help you when beginning to rehabilitate your injury. These exercises should be done near your "sweet spot." This is the neutral, low-back arch, somewhere between fully rounded and fully arched, that is your least painful position. When performed in this safe range of motion, these exercises can be used for people who have either a flexion or extension based injury. These exercises may resolve your symptoms with or without further involvement from your physician, physical therapist or athletic trainer. While completing these exercises, remember:   Muscles can gain both the endurance and the strength needed for everyday activities through controlled exercises.  Complete these exercises as instructed by your physician,  physical therapist or athletic trainer. Progress with the resistance and repetition exercises only as your caregiver advises.  You may experience muscle soreness or fatigue, but the pain or discomfort you are trying to eliminate should never worsen during these exercises. If this pain does worsen, stop and make certain you are following the directions exactly. If the pain is still present after adjustments, discontinue the exercise until you can discuss the trouble with your clinician. STRENGTHENING - Deep Abdominals, Pelvic Tilt   Lie on a firm bed or floor. Keeping your legs in front of you, bend your knees so they are both pointed toward the ceiling and your feet are flat on the floor.  Tense your lower abdominal muscles to press your low back into the floor. This motion will rotate your pelvis so that your tail bone is scooping upwards rather than pointing at your feet or into the floor.  With a gentle tension and even breathing, hold this position for __________ seconds. Repeat __________ times. Complete this exercise __________ times per day.  STRENGTHENING - Abdominals, Crunches   Lie on a firm bed or floor. Keeping your  legs in front of you, bend your knees so they are both pointed toward the ceiling and your feet are flat on the floor. Cross your arms over your chest.  Slightly tip your chin down without bending your neck.  Tense your abdominals and slowly lift your trunk high enough to just clear your shoulder blades. Lifting higher can put excessive stress on the low back and does not further strengthen your abdominal muscles.  Control your return to the starting position. Repeat __________ times. Complete this exercise __________ times per day.  STRENGTHENING - Quadruped, Opposite UE/LE Lift  Assume a hands and knees position on a firm surface. Keep your hands under your shoulders and your knees under your hips. You may place padding under your knees for comfort.  Find your  neutral spine and gently tense your abdominal muscles so that you can maintain this position. Your shoulders and hips should form a rectangle that is parallel with the floor and is not twisted.  Keeping your trunk steady, lift your right hand no higher than your shoulder and then your left leg no higher than your hip. Make sure you are not holding your breath. Hold this position __________ seconds.  Continuing to keep your abdominal muscles tense and your back steady, slowly return to your starting position. Repeat with the opposite arm and leg. Repeat __________ times. Complete this exercise __________ times per day.  STRENGTHENING - Abdominals and Quadriceps, Straight Leg Raise   Lie on a firm bed or floor with both legs extended in front of you.  Keeping one leg in contact with the floor, bend the other knee so that your foot can rest flat on the floor.  Find your neutral spine, and tense your abdominal muscles to maintain your spinal position throughout the exercise.  Slowly lift your straight leg off the floor about 6 inches for a count of 15, making sure to not hold your breath.  Still keeping your neutral spine, slowly lower your leg all the way to the floor. Repeat this exercise with each leg __________ times. Complete this exercise __________ times per day. POSTURE AND BODY MECHANICS CONSIDERATIONS - Sciatica Keeping correct posture when sitting, standing or completing your activities will reduce the stress put on different body tissues, allowing injured tissues a chance to heal and limiting painful experiences. The following are general guidelines for improved posture. Your physician or physical therapist will provide you with any instructions specific to your needs. While reading these guidelines, remember:  The exercises prescribed by your provider will help you have the flexibility and strength to maintain correct postures.  The correct posture provides the optimal environment for  your joints to work. All of your joints have less wear and tear when properly supported by a spine with good posture. This means you will experience a healthier, less painful body.  Correct posture must be practiced with all of your activities, especially prolonged sitting and standing. Correct posture is as important when doing repetitive low-stress activities (typing) as it is when doing a single heavy-load activity (lifting). RESTING POSITIONS Consider which positions are most painful for you when choosing a resting position. If you have pain with flexion-based activities (sitting, bending, stooping, squatting), choose a position that allows you to rest in a less flexed posture. You would want to avoid curling into a fetal position on your side. If your pain worsens with extension-based activities (prolonged standing, working overhead), avoid resting in an extended position such as sleeping on your  stomach. Most people will find more comfort when they rest with their spine in a more neutral position, neither too rounded nor too arched. Lying on a non-sagging bed on your side with a pillow between your knees, or on your back with a pillow under your knees will often provide some relief. Keep in mind, being in any one position for a prolonged period of time, no matter how correct your posture, can still lead to stiffness. PROPER SITTING POSTURE In order to minimize stress and discomfort on your spine, you must sit with correct posture Sitting with good posture should be effortless for a healthy body. Returning to good posture is a gradual process. Many people can work toward this most comfortably by using various supports until they have the flexibility and strength to maintain this posture on their own. When sitting with proper posture, your ears will fall over your shoulders and your shoulders will fall over your hips. You should use the back of the chair to support your upper back. Your low back will be  in a neutral position, just slightly arched. You may place a small pillow or folded towel at the base of your low back for support.  When working at a desk, create an environment that supports good, upright posture. Without extra support, muscles fatigue and lead to excessive strain on joints and other tissues. Keep these recommendations in mind: CHAIR:   A chair should be able to slide under your desk when your back makes contact with the back of the chair. This allows you to work closely.  The chair's height should allow your eyes to be level with the upper part of your monitor and your hands to be slightly lower than your elbows. BODY POSITION  Your feet should make contact with the floor. If this is not possible, use a foot rest.  Keep your ears over your shoulders. This will reduce stress on your neck and low back. INCORRECT SITTING POSTURES   If you are feeling tired and unable to assume a healthy sitting posture, do not slouch or slump. This puts excessive strain on your back tissues, causing more damage and pain. Healthier options include:  Using more support, like a lumbar pillow.  Switching tasks to something that requires you to be upright or walking.  Talking a brief walk.  Lying down to rest in a neutral-spine position. PROLONGED STANDING WHILE SLIGHTLY LEANING FORWARD  When completing a task that requires you to lean forward while standing in one place for a long time, place either foot up on a stationary 2-4 inch high object to help maintain the best posture. When both feet are on the ground, the low back tends to lose its slight inward curve. If this curve flattens (or becomes too large), then the back and your other joints will experience too much stress, fatigue more quickly and can cause pain.  CORRECT STANDING POSTURES Proper standing posture should be assumed with all daily activities, even if they only take a few moments, like when brushing your teeth. As in sitting,  your ears should fall over your shoulders and your shoulders should fall over your hips. You should keep a slight tension in your abdominal muscles to brace your spine. Your tailbone should point down to the ground, not behind your body, resulting in an over-extended swayback posture.  INCORRECT STANDING POSTURES  Common incorrect standing postures include a forward head, locked knees and/or an excessive swayback. WALKING Walk with an upright posture. Your  ears, shoulders and hips should all line-up. PROLONGED ACTIVITY IN A FLEXED POSITION When completing a task that requires you to bend forward at your waist or lean over a low surface, try to find a way to stabilize 3 of 4 of your limbs. You can place a hand or elbow on your thigh or rest a knee on the surface you are reaching across. This will provide you more stability so that your muscles do not fatigue as quickly. By keeping your knees relaxed, or slightly bent, you will also reduce stress across your low back. CORRECT LIFTING TECHNIQUES DO :   Assume a wide stance. This will provide you more stability and the opportunity to get as close as possible to the object which you are lifting.  Tense your abdominals to brace your spine; then bend at the knees and hips. Keeping your back locked in a neutral-spine position, lift using your leg muscles. Lift with your legs, keeping your back straight.  Test the weight of unknown objects before attempting to lift them.  Try to keep your elbows locked down at your sides in order get the best strength from your shoulders when carrying an object.  Always ask for help when lifting heavy or awkward objects. INCORRECT LIFTING TECHNIQUES DO NOT:   Lock your knees when lifting, even if it is a small object.  Bend and twist. Pivot at your feet or move your feet when needing to change directions.  Assume that you cannot safely pick up a paperclip without proper posture. Document Released: 11/29/2005  Document Revised: 04/15/2014 Document Reviewed: 03/13/2009 [facility] Patient Information 2015 [place], [place]. This information is not intended to replace advice given to you by your health care provider. Make sure you discuss any questions you have with your health care provider.
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
## 2015-03-31 LAB — COMPREHENSIVE METABOLIC PANEL
ALT: 48 U/L — AB (ref 0–35)
AST: 74 U/L — AB (ref 0–37)
Albumin: 4.3 g/dL (ref 3.5–5.2)
Alkaline Phosphatase: 84 U/L (ref 39–117)
BILIRUBIN TOTAL: 0.4 mg/dL (ref 0.2–1.2)
BUN: 12 mg/dL (ref 6–23)
CO2: 26 mEq/L (ref 19–32)
Calcium: 9.9 mg/dL (ref 8.4–10.5)
Chloride: 102 mEq/L (ref 96–112)
Creat: 0.84 mg/dL (ref 0.50–1.10)
GLUCOSE: 89 mg/dL (ref 70–99)
Potassium: 4.2 mEq/L (ref 3.5–5.3)
SODIUM: 135 meq/L (ref 135–145)
Total Protein: 8.1 g/dL (ref 6.0–8.3)

## 2015-04-02 ENCOUNTER — Telehealth: Payer: Self-pay

## 2015-04-02 NOTE — Telephone Encounter (Signed)
Pt saw Dr. Tamala Julian 4/17 for sciatic pain, states the muscle relaxer she was given has not been working, and would like to be called in something else

## 2015-04-04 MED ORDER — ACETAMINOPHEN-CODEINE 300-30 MG PO TABS: 1.0000 | ORAL_TABLET | Freq: Four times a day (QID) | ORAL | Status: DC | PRN

## 2015-04-04 NOTE — Telephone Encounter (Signed)
Called pt, advised Rx was sent in.

## 2015-04-04 NOTE — Telephone Encounter (Signed)
Please call in Tylenol#3 as prescribed to pharmacy.

## 2015-04-16 ENCOUNTER — Telehealth: Payer: Self-pay | Admitting: Radiology

## 2015-05-06 ENCOUNTER — Other Ambulatory Visit (HOSPITAL_COMMUNITY)
Admission: RE | Admit: 2015-05-06 | Discharge: 2015-05-06 | Disposition: A | Discharge status: Home / Self Care | Payer: Medicare Other | Source: Ambulatory Visit | Attending: Oral Surgery | Admitting: Oral Surgery

## 2015-05-06 DIAGNOSIS — Z01818 Encounter for other preprocedural examination: Secondary | ICD-10-CM | POA: Insufficient documentation

## 2015-05-06 DIAGNOSIS — B1920 Unspecified viral hepatitis C without hepatic coma: Secondary | ICD-10-CM | POA: Diagnosis not present

## 2015-05-06 DIAGNOSIS — D539 Nutritional anemia, unspecified: Secondary | ICD-10-CM | POA: Diagnosis not present

## 2015-05-06 LAB — BASIC METABOLIC PANEL
Anion gap: 9 (ref 5–15)
BUN: 9 mg/dL (ref 6–20)
CO2: 30 mmol/L (ref 22–32)
Calcium: 9.7 mg/dL (ref 8.9–10.3)
Chloride: 98 mmol/L — ABNORMAL LOW (ref 101–111)
Creatinine, Ser: 0.97 mg/dL (ref 0.44–1.00)
GFR calc Af Amer: >60 mL/min (ref >60)
Glucose, Bld: 99 mg/dL (ref 65–99)
Potassium: 3.7 mmol/L (ref 3.5–5.1)
Sodium: 137 mmol/L (ref 135–145)

## 2015-05-06 LAB — CBC
HCT: 30.8 % — ABNORMAL LOW (ref 36.0–46.0)
Hemoglobin: 10 g/dL — ABNORMAL LOW (ref 12.0–15.0)
MCH: 28.9 pg (ref 26.0–34.0)
MCHC: 32.5 g/dL (ref 30.0–36.0)
MCV: 89 fL (ref 78.0–100.0)
PLATELETS: 202 10*3/uL (ref 150–400)
RBC: 3.46 MIL/uL — ABNORMAL LOW (ref 3.87–5.11)
RDW: 24.1 % — ABNORMAL HIGH (ref 11.5–15.5)
WBC: 7.9 10*3/uL (ref 4.0–10.5)

## 2015-05-13 ENCOUNTER — Telehealth: Payer: Self-pay | Admitting: *Deleted

## 2015-05-13 NOTE — Telephone Encounter (Signed)
Called patient to schedule lab appt for Hep C.  She said she needed to arrange transportation and call back. Just need to add to lab schedule. Myrtis Hopping

## 2015-05-15 ENCOUNTER — Other Ambulatory Visit: Payer: Self-pay

## 2015-05-29 ENCOUNTER — Ambulatory Visit (INDEPENDENT_AMBULATORY_CARE_PROVIDER_SITE_OTHER): Payer: Medicare Other | Admitting: Family Medicine

## 2015-05-29 VITALS — BP 110/62 | HR 97 | Temp 98.1°F | Resp 18 | Ht 64.0 in | Wt 122.0 lb

## 2015-05-29 DIAGNOSIS — G40909 Epilepsy, unspecified, not intractable, without status epilepticus: Secondary | ICD-10-CM | POA: Diagnosis not present

## 2015-05-29 DIAGNOSIS — M5441 Lumbago with sciatica, right side: Secondary | ICD-10-CM

## 2015-05-29 MED ORDER — METHOCARBAMOL 750 MG PO TABS
750.0000 mg | ORAL_TABLET | Freq: Three times a day (TID) | ORAL | Status: DC | PRN
Start: 2015-05-29 — End: 2016-01-02

## 2015-05-29 MED ORDER — OXYCODONE-ACETAMINOPHEN 10-325 MG PO TABS: 1.0000 | ORAL_TABLET | Freq: Two times a day (BID) | ORAL | Status: DC | PRN

## 2015-05-29 NOTE — Progress Notes (Signed)
Subjective:    Patient ID: Melissa Bridges, female    DOB: 16-Nov-1961, 54 y.o.   MRN: [id number]  HPI This is a pleasant 54 yo female with multiple medical problems. She presents today with complaint of worsening sciatic pain. She was seen 03/30/15 by Dr. Tamala Julian for similar symptoms, but she is worse after two long car trips. She has gotten some relief with Robaxin and percocet. She tried meloxicam and it made her nauseous. Right leg pain from buttock to foot. Has not had any falls. She was referred to Dr. Othelia Pulling but has had difficulties with transportation. She is very much interested in a new referral and appointment with him now. She reports that her daughter, who is a Marine scientist, can give her a ride with enough notice.   Has noticed left elbow pain and some discoloration of skin.   She is requesting a referral to a neurologist for her chronic seizures. She reports a longstanding history of seizures with last seizure about 8-10 months ago.   Has reduced cigarette smoking to 1 pack every 3 days. Asthma stable. Rarely use of albuterol.   Past Medical History  Diagnosis Date  . Sciatic pain   . Schizoaffective disorder   . Hepatitis C   . Arthritis   . Asthma   . Migraine   . Allergy   . Depression   . Anxiety   . Alopecia   . Substance abuse   . Hypertension     onset age 30.  . Seizures     onset in childhood.  Generalized tonic-clonic.     Past Surgical History  Procedure Laterality Date  . Dilation and curettage of uterus    . Exploratory laparotomy    . Ovarian cyst removal  2008  . Behavioral health admissions      multiple  . Abdominal hysterectomy      ovarian cyst B, cervical dysplasia, fibroids.   Ovaries intact.   Family History  Problem Relation Age of Onset  . Diabetes type II Mother   . Hypertension Mother   . Arthritis Mother   . Diabetes type II Maternal Aunt   . Cancer Maternal Uncle   . Hypertension Father   . Heart murmur Father   . Arthritis Father     . Alopecia Sister   . Alopecia Brother   . Alopecia Sister   . Mental retardation Sister    History  Substance Use Topics  . Smoking status: Current Every Day Smoker -- 1.00 packs/day for 44 years    Types: Cigarettes  . Smokeless tobacco: Never Used  . Alcohol Use: 8.4 oz/week    14 Cans of beer per week   Medications, allergies, past medical history, surgical history, family history, social history and problem list reviewed and updated.  Review of Systems No falls, no worsening of SOB. Had some diarrhea for two days. None today. No fecal or urinary incontinence. No urinary frequency/dysuria/hematuria.    Objective:   Physical Exam  Constitutional:  Thin female. Appears moderately uncomfortable. Shifting in her chair and standing/sitting frequently.   HENT:  Head: Normocephalic and atraumatic.  Right Ear: Tympanic membrane, external ear and ear canal normal.  Left Ear: Tympanic membrane, external ear and ear canal normal.  Nose: Nose normal.  Mouth/Throat: Uvula is midline and oropharynx is clear and moist. She has dentures.  Eyes: Conjunctivae are normal. Pupils are equal, round, and reactive to light.  Neck: Normal range of motion. Neck supple.  Cardiovascular:  Normal rate, regular rhythm and normal heart sounds.   Pulmonary/Chest: Effort normal and breath sounds normal.  Abdominal: Soft. Bowel sounds are normal.  Musculoskeletal: She exhibits no edema.       Cervical back: Normal.       Thoracic back: Normal.       Lumbar back: She exhibits decreased range of motion, tenderness (right lumbar wrapping around to right abdomen.) and pain.  Pain with straight leg raise and even lying on exam table.   Lymphadenopathy:    She has no cervical adenopathy.  Neurological: She is alert.  Reflex Scores:      Bicep reflexes are 1+ on the right side and 1+ on the left side.      Brachioradialis reflexes are 1+ on the right side and 1+ on the left side.      Patellar reflexes are 0  on the right side and 0 on the left side. Skin: Skin is warm and dry. She is not diaphoretic.  Vitals reviewed.   BP 110/62 mmHg  Pulse 97  Temp(Src) 98.1 F (36.7 C) (Oral)  Resp 18  Ht 5\' 4"  (1.626 m)  Wt 122 lb (55.339 kg)  BMI 20.93 kg/m2  SpO2 99%     Assessment & Plan:  1. Right-sided low back pain with right-sided sciatica - I discussed limits of what we can do for her medically with this chronic problem with exacerbation. I will provide her enough medication to get her through to her appointment with Dr. Othelia Pulling. - oxyCODONE-acetaminophen (PERCOCET) 10-325 MG per tablet; Take 1 tablet by mouth every 12 (twelve) hours as needed for pain.  Dispense: 20 tablet; Refill: 0 - methocarbamol (ROBAXIN) 750 MG tablet; Take 1 tablet (750 mg total) by mouth every 8 (eight) hours as needed for muscle spasms.  Dispense: 30 tablet; Refill: 0 - Ambulatory referral to Orthopedic Surgery- called Dr. Alton Revere office and made patient an appointment for 6/24 at 9:15   2. Seizure disorder - Ambulatory referral to Neurology  - Follow up with Dr. Tamala Julian next month as scheduled.   Clarene Reamer, FNP-BC  [facility]  05/29/2015 12:20 PM

## 2015-05-29 NOTE — Patient Instructions (Signed)
You have an appointment with Dr. Othelia Pulling on 6/24 at 9:15 am  Radicular Pain Radicular pain in either the arm or leg is usually from a bulging or herniated disk in the spine. A piece of the herniated disk may press against the nerves as the nerves exit the spine. This causes pain which is felt at the tips of the nerves down the arm or leg. Other causes of radicular pain may include:  Fractures.  Heart disease.  Cancer.  An abnormal and usually degenerative state of the nervous system or nerves (neuropathy). Diagnosis may require CT or MRI scanning to determine the primary cause.  Nerves that start at the neck (nerve roots) may cause radicular pain in the outer shoulder and arm. It can spread down to the thumb and fingers. The symptoms vary depending on which nerve root has been affected. In most cases radicular pain improves with conservative treatment. Neck problems may require physical therapy, a neck collar, or cervical traction. Treatment may take many weeks, and surgery may be considered if the symptoms do not improve.  Conservative treatment is also recommended for sciatica. Sciatica causes pain to radiate from the lower back or buttock area down the leg into the foot. Often there is a history of back problems. Most patients with sciatica are better after 2 to 4 weeks of rest and other supportive care. Short term bed rest can reduce the disk pressure considerably. Sitting, however, is not a good position since this increases the pressure on the disk. You should avoid bending, lifting, and all other activities which make the problem worse. Traction can be used in severe cases. Surgery is usually reserved for patients who do not improve within the first months of treatment. Only take over-the-counter or prescription medicines for pain, discomfort, or fever as directed by your caregiver. Narcotics and muscle relaxants may help by relieving more severe pain and spasm and by providing mild sedation.  Cold or massage can give significant relief. Spinal manipulation is not recommended. It can increase the degree of disc protrusion. Epidural steroid injections are often effective treatment for radicular pain. These injections deliver medicine to the spinal nerve in the space between the protective covering of the spinal cord and back bones (vertebrae). Your caregiver can give you more information about steroid injections. These injections are most effective when given within two weeks of the onset of pain.  You should see your caregiver for follow up care as recommended. A program for neck and back injury rehabilitation with stretching and strengthening exercises is an important part of management.  SEEK IMMEDIATE MEDICAL CARE IF:  You develop increased pain, weakness, or numbness in your arm or leg.  You develop difficulty with bladder or bowel control.  You develop abdominal pain. Document Released: 01/06/2005 Document Revised: 02/21/2012 Document Reviewed: 03/24/2009 [facility] Patient Information 2015 [place], [place]. This information is not intended to replace advice given to you by your health care provider. Make sure you discuss any questions you have with your health care provider.

## 2015-06-06 DIAGNOSIS — M5416 Radiculopathy, lumbar region: Secondary | ICD-10-CM | POA: Diagnosis not present

## 2015-06-09 ENCOUNTER — Other Ambulatory Visit: Payer: Self-pay | Admitting: Orthopedic Surgery

## 2015-06-09 DIAGNOSIS — M545 Low back pain: Secondary | ICD-10-CM

## 2015-06-10 ENCOUNTER — Ambulatory Visit (INDEPENDENT_AMBULATORY_CARE_PROVIDER_SITE_OTHER): Payer: Medicare Other | Admitting: Family Medicine

## 2015-06-10 VITALS — BP 110/68 | HR 93 | Temp 97.6°F | Resp 18 | Ht 63.0 in | Wt 124.0 lb

## 2015-06-10 DIAGNOSIS — A599 Trichomoniasis, unspecified: Secondary | ICD-10-CM

## 2015-06-10 DIAGNOSIS — M5431 Sciatica, right side: Secondary | ICD-10-CM | POA: Diagnosis not present

## 2015-06-10 DIAGNOSIS — M5441 Lumbago with sciatica, right side: Secondary | ICD-10-CM

## 2015-06-10 DIAGNOSIS — R300 Dysuria: Secondary | ICD-10-CM | POA: Diagnosis not present

## 2015-06-10 LAB — POCT URINALYSIS DIPSTICK
BILIRUBIN UA: NEGATIVE
Glucose, UA: NEGATIVE
Ketones, UA: NEGATIVE
Nitrite, UA: NEGATIVE
PH UA: 6.5
Protein, UA: NEGATIVE
Spec Grav, UA: 1.015
Urobilinogen, UA: 0.2

## 2015-06-10 LAB — POCT WET PREP WITH KOH
Clue Cells Wet Prep HPF POC: 100
KOH PREP POC: NEGATIVE
RBC WET PREP PER HPF POC: NEGATIVE
TRICHOMONAS UA: POSITIVE
YEAST WET PREP PER HPF POC: NEGATIVE

## 2015-06-10 LAB — POCT UA - MICROSCOPIC ONLY
Casts, Ur, LPF, POC: NEGATIVE
Crystals, Ur, HPF, POC: NEGATIVE
MUCUS UA: NEGATIVE
Yeast, UA: NEGATIVE

## 2015-06-10 MED ORDER — CEPHALEXIN 500 MG PO CAPS: 500.0000 mg | ORAL_CAPSULE | Freq: Two times a day (BID) | ORAL | Status: DC

## 2015-06-10 MED ORDER — OXYCODONE-ACETAMINOPHEN 10-325 MG PO TABS: 1.0000 | ORAL_TABLET | Freq: Two times a day (BID) | ORAL | Status: DC | PRN

## 2015-06-10 MED ORDER — METRONIDAZOLE 500 MG PO TABS: 500.0000 mg | ORAL_TABLET | Freq: Two times a day (BID) | ORAL | Status: DC

## 2015-06-10 NOTE — Patient Instructions (Addendum)
Your urinary symptoms are likely due to your trichomonas infection- this is an STD.   We will treat this with flagyl twice a day for one week and  will be in touch with the rest of your labs asap You can continue to use the pain medication as needed but be cautious of sedation Remember that you cannot drink alcohol while on the flagyl Any partner(s) also need to be treated to prevent re-infection No sex for 2 weeks   For not you do not need to fill the keflex (urinary antibiotic) rx- leave this at the drug store and I will tell you if you need it for a UTI aslso

## 2015-06-10 NOTE — Progress Notes (Addendum)
[facility] [address] [place] [address] [phone]  Date:  06/10/2015   Name:  Melissa Bridges   DOB:  1961-11-06   MRN:  [id number]  PCP:  Reginia Forts, MD    Chief Complaint: Sciatica; burning with urination; and Depression   History of Present Illness:  Melissa Bridges is a 54 y.o. very pleasant female patient who presents with the following:  She was seen here on 4/17 and again 6/16 with complaint of sciatic nerve pain.    She has a seizure disorder but has not had a seizure in nearly a year. Currently on depakote.  She has been referred to ortho and had an appt with Dr. Othelia Pulling on 6/24.  She reports that she was seen, is set up for an MRI on 06/21/15.  Pt states that Dr. Othelia Pulling would not give her pain medication and asked her to see her PCP about this.  She feels that she needs some more pain medication as her pain is pretty severe  She notes urinary pain for about one week.  Her sx have been getting worse.  Also she has noted pain with intercourse for 4-5 months.    Review of controlled substance database shows #20 oxycodone 10 dispensed on 6/16 from [facility]  Patient Active Problem List   Diagnosis Date Noted  . Elevated LFTs 09/09/2014  . Seizure disorder 09/09/2014  . Alcohol abuse 09/06/2014  . Routine general medical examination at a health care facility 06/12/2013  . Routine gynecological examination 06/12/2013  . Screening for diabetes mellitus 06/12/2013  . Asthma with acute exacerbation 03/09/2013  . Hypertension 03/09/2013  . Lower back pain 03/09/2013  . Hepatitis C 03/08/2013  . Smoker 12/12/2012  . Seizures 12/12/2012  . Schizoaffective disorder 12/12/2012  . Substance abuse 11/14/2011    Past Medical History  Diagnosis Date  . Sciatic pain   . Schizoaffective disorder   . Hepatitis C   . Arthritis   . Asthma   . Migraine   . Allergy   . Depression   . Anxiety   . Alopecia   . Substance abuse   . Hypertension    onset age 63.  . Seizures     onset in childhood.  Generalized tonic-clonic.      Past Surgical History  Procedure Laterality Date  . Dilation and curettage of uterus    . Exploratory laparotomy    . Ovarian cyst removal  2008  . Behavioral health admissions      multiple  . Abdominal hysterectomy      ovarian cyst B, cervical dysplasia, fibroids.   Ovaries intact.    History  Substance Use Topics  . Smoking status: Current Every Day Smoker -- 1.00 packs/day for 44 years    Types: Cigarettes  . Smokeless tobacco: Never Used  . Alcohol Use: 8.4 oz/week    14 Cans of beer per week    Family History  Problem Relation Age of Onset  . Diabetes type II Mother   . Hypertension Mother   . Arthritis Mother   . Diabetes type II Maternal Aunt   . Cancer Maternal Uncle   . Hypertension Father   . Heart murmur Father   . Arthritis Father   . Alopecia Sister   . Alopecia Brother   . Alopecia Sister   . Mental retardation Sister     Allergies  Allergen Reactions  . Ibuprofen Other (See Comments)    Stomach upset; However,  pt can take Meloxicam without incident and takes this at home.   . Aspirin Rash and Other (See Comments)    Stomach upset    Medication list has been reviewed and updated.  Current Outpatient Prescriptions on File Prior to Visit  Medication Sig Dispense Refill  . albuterol (PROVENTIL HFA;VENTOLIN HFA) 108 (90 BASE) MCG/ACT inhaler Inhale 2 puffs into the lungs every 6 (six) hours as needed for shortness of breath. 1 Inhaler 11  . amLODipine (NORVASC) 5 MG tablet Take 1 tablet (5 mg total) by mouth daily. High blood pressure 30 tablet 5  . budesonide-formoterol (SYMBICORT) 80-4.5 MCG/ACT inhaler Inhale 2 puffs into the lungs daily as needed (for shortness of breath). 1 Inhaler 12  . citalopram (CELEXA) 10 MG tablet Take 1 tablet (10 mg total) by mouth daily. For depression 30 tablet 2  . divalproex (DEPAKOTE ER) 500 MG 24 hr tablet Take 2 tablets (1,000 mg  total) by mouth daily. For mood stabilization 60 tablet 2  . methocarbamol (ROBAXIN) 750 MG tablet Take 1 tablet (750 mg total) by mouth every 8 (eight) hours as needed for muscle spasms. 30 tablet 0  . QUEtiapine (SEROQUEL) 300 MG tablet Take 1 tablet (300 mg total) by mouth at bedtime. Mood control 30 tablet 2  . Acetaminophen-Codeine 300-30 MG per tablet Take 1 tablet by mouth every 6 (six) hours as needed for pain. (Patient not taking: Reported on 05/29/2015) 40 tablet 0  . oxyCODONE-acetaminophen (PERCOCET) 10-325 MG per tablet Take 1 tablet by mouth every 12 (twelve) hours as needed for pain. (Patient not taking: Reported on 06/10/2015) 20 tablet 0  . tizanidine (ZANAFLEX) 2 MG capsule Take 1 capsule (2 mg total) by mouth 3 (three) times daily as needed for muscle spasms. (Patient not taking: Reported on 05/29/2015) 30 capsule 0   No current facility-administered medications on file prior to visit.    Review of Systems:  As per HPI- otherwise negative.   Physical Examination: Filed Vitals:   06/10/15 1126  BP: 110/68  Pulse: 93  Temp: 97.6 F (36.4 C)  Resp: 18   Filed Vitals:   06/10/15 1126  Height: 5\' 3"  (1.6 m)  Weight: 124 lb (56.246 kg)   Body mass index is 21.97 kg/(m^2). Ideal Body Weight: Weight in (lb) to have BMI = 25: 140.8  GEN: WDWN, NAD, Non-toxic, A & O x 3, looks well, slim build HEENT: Atraumatic, Normocephalic. Neck supple. No masses, No LAD. Ears and Nose: No external deformity. CV: RRR, No M/G/R. No JVD. No thrill. No extra heart sounds. PULM: CTA B, no wheezes, crackles, rhonchi. No retractions. No resp. distress. No accessory muscle use. ABD: S, NT, ND EXTR: No c/c/e NEURO Normal gait.  PSYCH: Normally interactive. Conversant. Not depressed or anxious appearing.  Calm demeanor.  Positive SLR on the right only, tenderness over the right sciatic notch.  Moves stiffly for age.  Restricted lumbar flexion GU: normal external and internal exam.  No  lesions, thin white discharge  Results for orders placed or performed in visit on 06/10/15  POCT urinalysis dipstick  Result Value Ref Range   Color, UA yellow    Clarity, UA clear    Glucose, UA neg    Bilirubin, UA neg    Ketones, UA neg    Spec Grav, UA 1.015    Blood, UA tr-lysed    pH, UA 6.5    Protein, UA neg    Urobilinogen, UA 0.2    Nitrite, UA  neg    Leukocytes, UA small (1+) (A) Negative  POCT UA - Microscopic Only  Result Value Ref Range   WBC, Ur, HPF, POC 6-15    RBC, urine, microscopic 0-1    Bacteria, U Microscopic trace    Mucus, UA neg    Epithelial cells, urine per micros 0-5    Crystals, Ur, HPF, POC neg    Casts, Ur, LPF, POC neg    Yeast, UA neg   POCT Wet Prep with KOH  Result Value Ref Range   Trichomonas, UA Positive    Clue Cells Wet Prep HPF POC 100%    Epithelial Wet Prep HPF POC Moderate Few, Moderate, Many   Yeast Wet Prep HPF POC neg    Bacteria Wet Prep HPF POC Many (A) Few   RBC Wet Prep HPF POC neg    WBC Wet Prep HPF POC 0-3    KOH Prep POC Negative    Assessment and Plan: Burning with urination - Plan: POCT urinalysis dipstick, POCT UA - Microscopic Only, Urine culture, GC/Chlamydia Probe Amp, POCT Wet Prep with KOH, cephALEXin (KEFLEX) 500 MG capsule  Right sided sciatica  Right-sided low back pain with right-sided sciatica - Plan: oxyCODONE-acetaminophen (PERCOCET) 10-325 MG per tablet  Trichomonas vaginalis infection - Plan: metroNIDAZOLE (FLAGYL) 500 MG tablet  Counseled pt that she has trich, this is the likely cause of her urinary sx and discomfort with sex.  Treat with flagyl- confirmed that she is not drinking regularly and advised that she cannot drink on this medication.  Avoid sex for 2 weeks. Partner also needs to be treated.  Gave her a keflex rx to hold in case her urine culture is also positive  Refilled a small supply of percocet for her back pain- await MRI  Meds ordered this encounter  Medications  . ferrous  sulfate 325 (65 FE) MG tablet    Sig: Take 325 mg by mouth daily with breakfast.  . oxyCODONE-acetaminophen (PERCOCET) 10-325 MG per tablet    Sig: Take 1 tablet by mouth every 12 (twelve) hours as needed for pain.    Dispense:  20 tablet    Refill:  0  . cephALEXin (KEFLEX) 500 MG capsule    Sig: Take 1 capsule (500 mg total) by mouth 2 (two) times daily.    Dispense:  14 capsule    Refill:  0  . metroNIDAZOLE (FLAGYL) 500 MG tablet    Sig: Take 1 tablet (500 mg total) by mouth 2 (two) times daily. Take 1 pill twice daily for one week. NO alcohol    Dispense:  14 tablet    Refill:  0   Called and let her know GC/CMZ negative on 6/29  Signed Lamar Blinks, MD

## 2015-06-11 LAB — GC/CHLAMYDIA PROBE AMP
CT Probe RNA: NEGATIVE
GC Probe RNA: NEGATIVE

## 2015-06-12 ENCOUNTER — Encounter: Payer: Self-pay | Admitting: Family Medicine

## 2015-06-12 LAB — URINE CULTURE
COLONY COUNT: NO GROWTH
Organism ID, Bacteria: NO GROWTH

## 2015-06-19 ENCOUNTER — Other Ambulatory Visit (INDEPENDENT_AMBULATORY_CARE_PROVIDER_SITE_OTHER): Payer: Medicare Other

## 2015-06-19 DIAGNOSIS — B182 Chronic viral hepatitis C: Secondary | ICD-10-CM

## 2015-06-19 LAB — IRON: Iron: 24 ug/dL — ABNORMAL LOW (ref 42–145)

## 2015-06-19 LAB — HIV ANTIBODY (ROUTINE TESTING W REFLEX): HIV: NONREACTIVE

## 2015-06-20 LAB — HEPATITIS C RNA QUANTITATIVE
HCV Quantitative Log: 6.66 {Log} — ABNORMAL HIGH (ref <1.18)
HCV Quantitative: 4525749 IU/mL — ABNORMAL HIGH (ref <15)

## 2015-06-20 LAB — PROTIME-INR
INR: 1.08 (ref <1.50)
PROTHROMBIN TIME: 14 s (ref 11.6–15.2)

## 2015-06-20 LAB — HEPATITIS B CORE ANTIBODY, TOTAL: HEP B C TOTAL AB: REACTIVE — AB

## 2015-06-20 LAB — HEPATITIS A ANTIBODY, TOTAL: Hep A Total Ab: REACTIVE — AB

## 2015-06-20 LAB — ANA: ANA: NEGATIVE

## 2015-06-20 LAB — HEPATITIS B SURFACE ANTIBODY,QUALITATIVE: Hep B S Ab: POSITIVE — AB

## 2015-06-20 LAB — HEPATITIS B SURFACE ANTIGEN: Hepatitis B Surface Ag: NEGATIVE

## 2015-06-21 ENCOUNTER — Inpatient Hospital Stay: Admission: RE | Admit: 2015-06-21 | Payer: Self-pay | Source: Ambulatory Visit

## 2015-06-25 LAB — HEPATITIS C GENOTYPE

## 2015-06-26 ENCOUNTER — Encounter (HOSPITAL_COMMUNITY): Payer: Self-pay | Admitting: *Deleted

## 2015-06-26 MED ORDER — CEFAZOLIN SODIUM-DEXTROSE 2-3 GM-% IV SOLR
2.0000 g | INTRAVENOUS | Status: AC
  Administered 2015-06-27: 2 g via INTRAVENOUS
  Filled 2015-06-26 (×2): qty 50

## 2015-06-26 NOTE — Progress Notes (Signed)
   06/26/15 1638  OBSTRUCTIVE SLEEP APNEA  Have you ever been diagnosed with sleep apnea through a sleep study? No  Do you snore loudly (loud enough to be heard through closed doors)?  1  Do you often feel tired, fatigued, or sleepy during the daytime? 1  Has anyone observed you stop breathing during your sleep? 1  Do you have, or are you being treated for high blood pressure? 1  BMI more than 35 kg/m2? 0  Age over 56 years old? 1  Neck circumference greater than 40 cm/16 inches? 0  Gender: 0

## 2015-06-26 NOTE — H&P (Signed)
HISTORY AND PHYSICAL  Melissa Bridges is a 54 y.o. female patient with CC: unable to wear dentures  No diagnosis found.  Past Medical History  Diagnosis Date  . Sciatic pain   . Schizoaffective disorder   . Hepatitis C   . Arthritis   . Asthma   . Migraine   . Allergy   . Depression   . Anxiety   . Alopecia   . Substance abuse   . Hypertension     onset age 62.  . Seizures     onset in childhood.  Generalized tonic-clonic.  After stoping medications.    Current Facility-Administered Medications  Medication Dose Route Frequency Provider Last Rate Last Dose  . [START ON 06/27/2015] ceFAZolin (ANCEF) IVPB 2 g/50 mL premix  2 g Intravenous To SS-Surg Diona Browner, DDS       Current Outpatient Prescriptions  Medication Sig Dispense Refill  . albuterol (PROVENTIL HFA;VENTOLIN HFA) 108 (90 BASE) MCG/ACT inhaler Inhale 2 puffs into the lungs every 6 (six) hours as needed for shortness of breath. 1 Inhaler 11  . amLODipine (NORVASC) 5 MG tablet Take 1 tablet (5 mg total) by mouth daily. High blood pressure 30 tablet 5  . budesonide-formoterol (SYMBICORT) 80-4.5 MCG/ACT inhaler Inhale 2 puffs into the lungs daily as needed (for shortness of breath). 1 Inhaler 12  . cephALEXin (KEFLEX) 500 MG capsule Take 1 capsule (500 mg total) by mouth 2 (two) times daily. 14 capsule 0  . citalopram (CELEXA) 10 MG tablet Take 1 tablet (10 mg total) by mouth daily. For depression 30 tablet 2  . divalproex (DEPAKOTE ER) 500 MG 24 hr tablet Take 2 tablets (1,000 mg total) by mouth daily. For mood stabilization 60 tablet 2  . ferrous sulfate 325 (65 FE) MG tablet Take 325 mg by mouth daily with breakfast.    . methocarbamol (ROBAXIN) 750 MG tablet Take 1 tablet (750 mg total) by mouth every 8 (eight) hours as needed for muscle spasms. 30 tablet 0  . metroNIDAZOLE (FLAGYL) 500 MG tablet Take 1 tablet (500 mg total) by mouth 2 (two) times daily. Take 1 pill twice daily for one week. NO alcohol 14 tablet 0  .  oxyCODONE-acetaminophen (PERCOCET) 10-325 MG per tablet Take 1 tablet by mouth every 12 (twelve) hours as needed for pain. 20 tablet 0  . QUEtiapine (SEROQUEL) 300 MG tablet Take 1 tablet (300 mg total) by mouth at bedtime. Mood control 30 tablet 2  . tizanidine (ZANAFLEX) 2 MG capsule Take 1 capsule (2 mg total) by mouth 3 (three) times daily as needed for muscle spasms. (Patient not taking: Reported on 05/29/2015) 30 capsule 0   Allergies  Allergen Reactions  . Ibuprofen Other (See Comments)    Stomach upset; However, pt can take Meloxicam without incident and takes this at home.   . Aspirin Rash and Other (See Comments)    Stomach upset   Active Problems:   * No active hospital problems. *  Vitals: There were no vitals taken for this visit. Lab results:No results found for this or any previous visit (from the past 75 hour(s)). Radiology Results: No results found. General appearance: alert, cooperative and no distress Head: Normocephalic, without obvious abnormality, atraumatic Eyes: negative Nose: Nares normal. Septum midline. Mucosa normal. No drainage or sinus tenderness. Throat: lips, mucosa, and tongue normal; teeth and gums normal and Edentulous maxilla. Palatal torus midline. 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 Pharynx clear. 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  Plan:Remove Maxillary and mandibular tori, Maxillary and mandibular exostoses. General anesthesia. Day surgery.   Gae Bon 06/26/2015

## 2015-06-27 ENCOUNTER — Encounter (HOSPITAL_COMMUNITY): Payer: Self-pay | Admitting: *Deleted

## 2015-06-27 ENCOUNTER — Ambulatory Visit (HOSPITAL_COMMUNITY)
Admission: RE | Admit: 2015-06-27 | Discharge: 2015-06-27 | Disposition: A | Discharge status: Home / Self Care | Payer: Medicare Other | Source: Ambulatory Visit | Attending: Oral Surgery | Admitting: Oral Surgery

## 2015-06-27 ENCOUNTER — Encounter (HOSPITAL_COMMUNITY)
Admission: RE | Disposition: A | Discharge status: Home / Self Care | Payer: Self-pay | Source: Ambulatory Visit | Attending: Oral Surgery

## 2015-06-27 ENCOUNTER — Ambulatory Visit (HOSPITAL_COMMUNITY): Payer: Medicare Other | Admitting: Anesthesiology

## 2015-06-27 DIAGNOSIS — F259 Schizoaffective disorder, unspecified: Secondary | ICD-10-CM | POA: Insufficient documentation

## 2015-06-27 DIAGNOSIS — G43909 Migraine, unspecified, not intractable, without status migrainosus: Secondary | ICD-10-CM | POA: Diagnosis not present

## 2015-06-27 DIAGNOSIS — F419 Anxiety disorder, unspecified: Secondary | ICD-10-CM | POA: Insufficient documentation

## 2015-06-27 DIAGNOSIS — F329 Major depressive disorder, single episode, unspecified: Secondary | ICD-10-CM | POA: Insufficient documentation

## 2015-06-27 DIAGNOSIS — B1920 Unspecified viral hepatitis C without hepatic coma: Secondary | ICD-10-CM | POA: Insufficient documentation

## 2015-06-27 DIAGNOSIS — J45909 Unspecified asthma, uncomplicated: Secondary | ICD-10-CM | POA: Insufficient documentation

## 2015-06-27 DIAGNOSIS — I10 Essential (primary) hypertension: Secondary | ICD-10-CM | POA: Diagnosis not present

## 2015-06-27 DIAGNOSIS — M278 Other specified diseases of jaws: Secondary | ICD-10-CM | POA: Diagnosis not present

## 2015-06-27 DIAGNOSIS — Z886 Allergy status to analgesic agent status: Secondary | ICD-10-CM | POA: Diagnosis not present

## 2015-06-27 DIAGNOSIS — J449 Chronic obstructive pulmonary disease, unspecified: Secondary | ICD-10-CM | POA: Diagnosis not present

## 2015-06-27 DIAGNOSIS — Z79899 Other long term (current) drug therapy: Secondary | ICD-10-CM | POA: Diagnosis not present

## 2015-06-27 DIAGNOSIS — L659 Nonscarring hair loss, unspecified: Secondary | ICD-10-CM | POA: Insufficient documentation

## 2015-06-27 DIAGNOSIS — M270 Developmental disorders of jaws: Secondary | ICD-10-CM | POA: Insufficient documentation

## 2015-06-27 DIAGNOSIS — K219 Gastro-esophageal reflux disease without esophagitis: Secondary | ICD-10-CM | POA: Diagnosis not present

## 2015-06-27 DIAGNOSIS — M1990 Unspecified osteoarthritis, unspecified site: Secondary | ICD-10-CM | POA: Insufficient documentation

## 2015-06-27 HISTORY — DX: Reserved for inherently not codable concepts without codable children: IMO0001

## 2015-06-27 HISTORY — DX: Schizoaffective disorder, unspecified: F25.9

## 2015-06-27 HISTORY — DX: Gastro-esophageal reflux disease without esophagitis: K21.9

## 2015-06-27 HISTORY — PX: MANDIBLE RECONSTRUCTION: SHX431

## 2015-06-27 HISTORY — DX: Chronic obstructive pulmonary disease, unspecified: J44.9

## 2015-06-27 LAB — COMPREHENSIVE METABOLIC PANEL
ALBUMIN: 3.6 g/dL (ref 3.5–5.0)
ALK PHOS: 66 U/L (ref 38–126)
ALT: 52 U/L (ref 14–54)
AST: 74 U/L — AB (ref 15–41)
Anion gap: 11 (ref 5–15)
BILIRUBIN TOTAL: 0.6 mg/dL (ref 0.3–1.2)
BUN: <5 mg/dL — ABNORMAL LOW (ref 6–20)
CALCIUM: 9.4 mg/dL (ref 8.9–10.3)
CHLORIDE: 101 mmol/L (ref 101–111)
CO2: 24 mmol/L (ref 22–32)
Creatinine, Ser: 0.91 mg/dL (ref 0.44–1.00)
GFR calc Af Amer: >60 mL/min (ref >60)
GFR calc non Af Amer: >60 mL/min (ref >60)
GLUCOSE: 131 mg/dL — AB (ref 65–99)
Potassium: 3.3 mmol/L — ABNORMAL LOW (ref 3.5–5.1)
Sodium: 136 mmol/L (ref 135–145)
Total Protein: 7.7 g/dL (ref 6.5–8.1)

## 2015-06-27 LAB — CBC
HEMATOCRIT: 32.7 % — AB (ref 36.0–46.0)
Hemoglobin: 10.5 g/dL — ABNORMAL LOW (ref 12.0–15.0)
MCH: 27.8 pg (ref 26.0–34.0)
MCHC: 32.1 g/dL (ref 30.0–36.0)
MCV: 86.5 fL (ref 78.0–100.0)
Platelets: 243 10*3/uL (ref 150–400)
RBC: 3.78 MIL/uL — ABNORMAL LOW (ref 3.87–5.11)
RDW: 18 % — ABNORMAL HIGH (ref 11.5–15.5)
WBC: 6.4 10*3/uL (ref 4.0–10.5)

## 2015-06-27 SURGERY — RECONSTRUCTION, MANDIBLE
Anesthesia: General | Site: Mouth

## 2015-06-27 MED ORDER — LIDOCAINE-EPINEPHRINE 2 %-1:100000 IJ SOLN
INTRAMUSCULAR | Status: DC | PRN
  Administered 2015-06-27: 20 mL

## 2015-06-27 MED ORDER — FENTANYL CITRATE (PF) 250 MCG/5ML IJ SOLN
INTRAMUSCULAR | Status: AC
  Filled 2015-06-27: qty 5

## 2015-06-27 MED ORDER — PROPOFOL 10 MG/ML IV BOLUS
INTRAVENOUS | Status: AC
  Filled 2015-06-27: qty 20

## 2015-06-27 MED ORDER — LIDOCAINE-EPINEPHRINE 2 %-1:100000 IJ SOLN
INTRAMUSCULAR | Status: AC
  Filled 2015-06-27: qty 1

## 2015-06-27 MED ORDER — PHENYLEPHRINE HCL 10 MG/ML IJ SOLN
INTRAMUSCULAR | Status: DC | PRN
  Administered 2015-06-27: 80 ug via INTRAVENOUS

## 2015-06-27 MED ORDER — SODIUM CHLORIDE 0.9 % IR SOLN
Status: DC | PRN
  Administered 2015-06-27: 1000 mL

## 2015-06-27 MED ORDER — MIDAZOLAM HCL 2 MG/2ML IJ SOLN
INTRAMUSCULAR | Status: AC
  Filled 2015-06-27: qty 2

## 2015-06-27 MED ORDER — DEXAMETHASONE SODIUM PHOSPHATE 4 MG/ML IJ SOLN
INTRAMUSCULAR | Status: DC | PRN
  Administered 2015-06-27: 8 mg via INTRAVENOUS

## 2015-06-27 MED ORDER — 0.9 % SODIUM CHLORIDE (POUR BTL) OPTIME
TOPICAL | Status: DC | PRN
  Administered 2015-06-27: 1000 mL

## 2015-06-27 MED ORDER — LIDOCAINE HCL (CARDIAC) 20 MG/ML IV SOLN
INTRAVENOUS | Status: DC | PRN
  Administered 2015-06-27: 50 mg via INTRAVENOUS

## 2015-06-27 MED ORDER — FENTANYL CITRATE (PF) 100 MCG/2ML IJ SOLN
INTRAMUSCULAR | Status: DC | PRN
  Administered 2015-06-27: 100 ug via INTRAVENOUS
  Administered 2015-06-27: 50 ug via INTRAVENOUS

## 2015-06-27 MED ORDER — PROPOFOL 10 MG/ML IV BOLUS
INTRAVENOUS | Status: DC | PRN
  Administered 2015-06-27: 200 mg via INTRAVENOUS

## 2015-06-27 MED ORDER — GLYCOPYRROLATE 0.2 MG/ML IJ SOLN
INTRAMUSCULAR | Status: DC | PRN
  Administered 2015-06-27: 0.6 mg via INTRAVENOUS

## 2015-06-27 MED ORDER — ROCURONIUM BROMIDE 100 MG/10ML IV SOLN
INTRAVENOUS | Status: DC | PRN
  Administered 2015-06-27: 10 mg via INTRAVENOUS

## 2015-06-27 MED ORDER — ONDANSETRON HCL 4 MG/2ML IJ SOLN
INTRAMUSCULAR | Status: DC | PRN
  Administered 2015-06-27: 4 mg via INTRAVENOUS

## 2015-06-27 MED ORDER — OXYMETAZOLINE HCL 0.05 % NA SOLN
1.0000 | Freq: Two times a day (BID) | NASAL | Status: DC
  Filled 2015-06-27: qty 15

## 2015-06-27 MED ORDER — NEOSTIGMINE METHYLSULFATE 10 MG/10ML IV SOLN
INTRAVENOUS | Status: DC | PRN
  Administered 2015-06-27: 5 mg via INTRAVENOUS

## 2015-06-27 MED ORDER — OXYCODONE-ACETAMINOPHEN 10-325 MG PO TABS: 1.0000 | ORAL_TABLET | ORAL | Status: DC | PRN

## 2015-06-27 MED ORDER — OXYMETAZOLINE HCL 0.05 % NA SOLN
NASAL | Status: AC
  Filled 2015-06-27: qty 15

## 2015-06-27 MED ORDER — LACTATED RINGERS IV SOLN
INTRAVENOUS | Status: DC
  Administered 2015-06-27 (×3): via INTRAVENOUS

## 2015-06-27 MED ORDER — AMOXICILLIN 500 MG PO CAPS: 500.0000 mg | ORAL_CAPSULE | Freq: Three times a day (TID) | ORAL | Status: DC

## 2015-06-27 MED ORDER — SUCCINYLCHOLINE CHLORIDE 20 MG/ML IJ SOLN
INTRAMUSCULAR | Status: DC | PRN
  Administered 2015-06-27: 100 mg via INTRAVENOUS

## 2015-06-27 MED ORDER — PROMETHAZINE HCL 25 MG/ML IJ SOLN
6.2500 mg | INTRAMUSCULAR | Status: DC | PRN
Start: 2015-06-27 — End: 2015-06-27

## 2015-06-27 MED ORDER — MIDAZOLAM HCL 5 MG/5ML IJ SOLN
INTRAMUSCULAR | Status: DC | PRN
  Administered 2015-06-27: 2 mg via INTRAVENOUS

## 2015-06-27 MED ORDER — OXYMETAZOLINE HCL 0.05 % NA SOLN
1.0000 | Freq: Once | NASAL | Status: AC
  Administered 2015-06-27: 1 via NASAL
  Filled 2015-06-27: qty 15

## 2015-06-27 MED ORDER — HYDROMORPHONE HCL 1 MG/ML IJ SOLN: 0.2500 mg | INTRAMUSCULAR | Status: DC | PRN

## 2015-06-27 SURGICAL SUPPLY — 26 items
BUR CROSS CUT FISSURE 1.6 (BURR) ×2 IMPLANT
BUR EGG ELITE 4.0 (BURR) ×2 IMPLANT
CANISTER SUCTION 2500CC (MISCELLANEOUS) ×2 IMPLANT
COVER SURGICAL LIGHT HANDLE (MISCELLANEOUS) ×2 IMPLANT
CRADLE DONUT ADULT HEAD (MISCELLANEOUS) ×2 IMPLANT
FLUID NSS /IRRIG 1000 ML XXX (MISCELLANEOUS) ×2 IMPLANT
GAUZE PACKING FOLDED 2  STR (GAUZE/BANDAGES/DRESSINGS) ×1
GAUZE PACKING FOLDED 2 STR (GAUZE/BANDAGES/DRESSINGS) ×1 IMPLANT
GLOVE BIO SURGEON STRL SZ 6.5 (GLOVE) ×4 IMPLANT
GLOVE BIO SURGEON STRL SZ7.5 (GLOVE) ×2 IMPLANT
GLOVE BIOGEL PI IND STRL 7.0 (GLOVE) ×2 IMPLANT
GLOVE BIOGEL PI INDICATOR 7.0 (GLOVE) ×2
GOWN STRL REUS W/ TWL LRG LVL3 (GOWN DISPOSABLE) ×2 IMPLANT
GOWN STRL REUS W/ TWL XL LVL3 (GOWN DISPOSABLE) ×1 IMPLANT
GOWN STRL REUS W/TWL LRG LVL3 (GOWN DISPOSABLE) ×2
GOWN STRL REUS W/TWL XL LVL3 (GOWN DISPOSABLE) ×1
KIT BASIN OR (CUSTOM PROCEDURE TRAY) ×2 IMPLANT
KIT ROOM TURNOVER OR (KITS) ×2 IMPLANT
NEEDLE 22X1 1/2 (OR ONLY) (NEEDLE) ×4 IMPLANT
NS IRRIG 1000ML POUR BTL (IV SOLUTION) ×2 IMPLANT
PAD ARMBOARD 7.5X6 YLW CONV (MISCELLANEOUS) ×2 IMPLANT
SUT CHROMIC 3 0 PS 2 (SUTURE) ×4 IMPLANT
SYR CONTROL 10ML LL (SYRINGE) ×2 IMPLANT
TRAY ENT MC OR (CUSTOM PROCEDURE TRAY) ×2 IMPLANT
TUBING IRRIGATION (MISCELLANEOUS) ×2 IMPLANT
YANKAUER SUCT BULB TIP NO VENT (SUCTIONS) ×2 IMPLANT

## 2015-06-27 NOTE — Anesthesia Postprocedure Evaluation (Signed)
Anesthesia Post Note  Patient: Melissa Bridges  Procedure(s) Performed: Procedure(s) (LRB): REMOVAL MAXILLARY PALATAL TORUS.  REMOVAL MANDIBULAR TORUS AND EXOSTOSIS.   (N/A)  Anesthesia type: general  Patient location: PACU  Post pain: Pain level controlled  Post assessment: Patient's Cardiovascular Status Stable  Last Vitals:  Filed Vitals:   06/27/15 1203  BP: 154/83  Pulse: 97  Temp:   Resp: 18    Post vital signs: Reviewed and stable  Level of consciousness: sedated  Complications: No apparent anesthesia complications

## 2015-06-27 NOTE — Transfer of Care (Signed)
Immediate Anesthesia Transfer of Care Note  Patient: Melissa Bridges  Procedure(s) Performed: Procedure(s): REMOVAL MAXILLARY PALATAL TORUS.  REMOVAL MANDIBULAR TORUS AND EXOSTOSIS.   (N/A)  Patient Location: PACU  Anesthesia Type:General  Level of Consciousness: awake, alert  and oriented  Airway & Oxygen Therapy: Patient Spontanous Breathing  Post-op Assessment: Report given to RN  Post vital signs: Reviewed and stable  Last Vitals:  Filed Vitals:   06/27/15 0816  BP: 144/83  Pulse: 93  Temp: 21.8 C    Complications: No apparent anesthesia complications

## 2015-06-27 NOTE — Anesthesia Preprocedure Evaluation (Addendum)
Anesthesia Evaluation  Patient identified by MRN, date of birth, ID band Patient awake    Reviewed: Allergy & Precautions, NPO status , Patient's Chart, lab work & pertinent test results  History of Anesthesia Complications Negative for: history of anesthetic complications  Airway Mallampati: II  TM Distance: >3 FB Neck ROM: Full    Dental  (+) Poor Dentition, Dental Advisory Given   Pulmonary asthma , COPDCurrent Smoker,    Pulmonary exam normal       Cardiovascular hypertension, Normal cardiovascular exam    Neuro/Psych Seizures -,  PSYCHIATRIC DISORDERS Anxiety Depression Schizophrenia  Neuromuscular disease    GI/Hepatic GERD-  ,(+) Hepatitis -, C  Endo/Other  negative endocrine ROS  Renal/GU      Musculoskeletal   Abdominal   Peds  Hematology   Anesthesia Other Findings   Reproductive/Obstetrics                            Anesthesia Physical Anesthesia Plan  ASA: III  Anesthesia Plan: General   Post-op Pain Management:    Induction: Intravenous  Airway Management Planned: Nasal ETT  Additional Equipment:   Intra-op Plan:   Post-operative Plan: Extubation in OR  Informed Consent: I have reviewed the patients History and Physical, chart, labs and discussed the procedure including the risks, benefits and alternatives for the proposed anesthesia with the patient or authorized representative who has indicated his/her understanding and acceptance.   Dental advisory given  Plan Discussed with: Anesthesiologist, CRNA and Surgeon  Anesthesia Plan Comments:        Anesthesia Quick Evaluation

## 2015-06-27 NOTE — Anesthesia Procedure Notes (Signed)
Procedure Name: Intubation Date/Time: 06/27/2015 10:33 AM Performed by: Vennie Homans Pre-anesthesia Checklist: Patient identified, Timeout performed, Emergency Drugs available, Suction available and Patient being monitored Patient Re-evaluated:Patient Re-evaluated prior to inductionOxygen Delivery Method: Circle system utilized Preoxygenation: Pre-oxygenation with 100% oxygen Intubation Type: IV induction Ventilation: Mask ventilation without difficulty Laryngoscope Size: Mac and 3 Grade View: Grade I Nasal Tubes: Nasal Rae, Right and Magill forceps - small, utilized Tube size: 7.0 mm Number of attempts: 1 Airway Equipment and Method: Stylet Placement Confirmation: ETT inserted through vocal cords under direct vision,  breath sounds checked- equal and bilateral and positive ETCO2 Secured at: 24 cm Tube secured with: Tape Dental Injury: Teeth and Oropharynx as per pre-operative assessment

## 2015-06-27 NOTE — H&P (Signed)
Anesthesia H&P Update: History and Physical Exam reviewed; patient is OK for planned anesthetic and procedure. ? ?

## 2015-06-27 NOTE — Op Note (Signed)
06/27/2015  11:14 AM  PATIENT:  Melissa Bridges  54 y.o. 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  REMOVAL BILATERAL MANDIBULAR TORi AND EXOSTOSIS.    SURGEON:  Surgeon(s): Diona Browner, DDS  ANESTHESIA:   local and general  EBL:  minimal  DRAINS: none   SPECIMEN:  No Specimen  COUNTS:  YES  PLAN OF CARE: Discharge to home after PACU  PATIENT DISPOSITION:  PACU - hemodynamically stable.   PROCEDURE DETAILS: Dictation # [id number]  Gae Bon, DMD 06/27/2015 11:14 AM

## 2015-06-27 NOTE — H&P (Signed)
H&P documentation  -History and Physical Reviewed  -Patient has been re-examined  -No change in the plan of care  Melissa Bridges M  

## 2015-06-28 NOTE — Op Note (Signed)
NAME:  Melissa Bridges, SPIKES               ACCOUNT NO.:  [id number]  MEDICAL RECORD NO.:  [id number]  LOCATION:  [facility]                         FACILITY:  [facility]  PHYSICIAN:  Gae Bon, M.D.  DATE OF BIRTH:  18-Nov-1961  DATE OF PROCEDURE:  06/27/2015 DATE OF DISCHARGE:                              OPERATIVE REPORT   PREOPERATIVE DIAGNOSES:  Maxillary palatal torus, bilateral mandibular tori, bilateral mandibular exostoses.  POSTOPERATIVE DIAGNOSES:  Maxillary palatal torus, bilateral mandibular tori, bilateral mandibular exostoses.  PROCEDURES:  Removal of maxillary palatal torus, removal of bilateral mandibular tori, and exostoses.  SURGEON:  Gae Bon, M.D.  ANESTHESIA:  General.  Dr. Tobias Alexander attending.  Nasal intubation.  DESCRIPTION OF PROCEDURE:  The patient was taken to the operating room, placed on the table in supine position.  General anesthesia was administered and a nasal endotracheal tube was placed and secured.  The eyes were protected.  The patient was draped for the procedure.  Time- out was performed.  The posterior pharynx was suctioned, and the throat pack was placed.  A 2% lidocaine with 1:100,000 epinephrine was infiltrated in an inferior alveolar block on the right and left side and in palatal and buccal infiltration of the maxilla, a total of 18 mL was utilized.  A bite block was placed in the right side of the mouth, and a sweetheart retractor was used to retract the tongue.  A #15 blade used to make an incision in the posterior left mandible along the alveolar crest extending to the medial aspect of the lateral incisor that remained in the mandible.  The periosteum was reflected both buccally and lingually to expose the lingual torus and to expose the buccal exostosis.  Then, a Seldin retractor was placed to retract the tissues from the floor of the mouth to give good access to the mandibular torus and then the egg-shaped burr was used to reduce the  torus and reduce the buccal exostosis as well.  Then, the bone file was used to smooth these areas.  Then, the areas were irrigated and closed with 3-0 chromic. Then, the bite block was repositioned and similar incision was created on the right mandibular alveolar crest using a 15 blade.  The periosteum was reflected buccally and lingually to expose the exostosis and to expose the lingual torus.  The Seldin elevator was used to protect the soft tissues on the floor of the mouth.  Egg-shaped burr and bone file were then used to remove the lingual torus on the right side and the buccal exostosis on the right side.  Then, the areas were irrigated and closed with 3-0 chromic.  Then, the a U shaped palatal incision was made in the palate, outside the palatal torus approximately half a cm.  Then, the periosteum was reflected to expose the palatal torus in all directions.  The Seldin retractor was used to retract the tissue, so that they were not injured during removal of torus.  Then, the egg- shaped burr was used to make a central trough posterior to anteriorly through the bulk of the torus and then another cut was made laterally to the midportion of the torus to create 4 different  areas.  Then, the egg- shaped burr was used to reduce each of these areas until there was a nice smoothness to the palate and normal palatal vault shape was present.  The bone file was used to smoothen the areas and then the area was irrigated and closed with 3-0 chromic.  The patient had a pre- existing denture and this was placed in the mouth and it was attempted to be used as a postsurgical splint, however, the denture was U shaped and had been fractured before, so that this splint technique was aborted.  Then, the oral cavity was irrigated and suctioned.  Throat pack was removed.  The patient was awakened, taken to the recovery room, breathing spontaneously in good condition.  EBL:  Minimal.  COMPLICATIONS:   None.  SPECIMENS:  None.     Gae Bon, M.D.     SMJ/MEDQ  D:  06/27/2015  T:  06/28/2015  Job:  [id number]

## 2015-06-30 ENCOUNTER — Encounter (HOSPITAL_COMMUNITY): Payer: Self-pay | Admitting: Oral Surgery

## 2015-06-30 NOTE — Anesthesia Postprocedure Evaluation (Signed)
Anesthesia Post Note  Patient: Melissa Bridges  Procedure(s) Performed: Procedure(s) (LRB): REMOVAL MAXILLARY PALATAL TORUS.  REMOVAL MANDIBULAR TORUS AND EXOSTOSIS.   (N/A)  Anesthesia type: general  Patient location: PACU  Post pain: Pain level controlled  Post assessment: Patient's Cardiovascular Status Stable  Last Vitals:  Filed Vitals:   06/27/15 1203  BP: 154/83  Pulse: 97  Temp:   Resp: 18    Post vital signs: Reviewed and stable  Level of consciousness: sedated  Complications: No apparent anesthesia complications

## 2015-07-02 ENCOUNTER — Encounter: Payer: Self-pay | Admitting: Family Medicine

## 2015-07-02 NOTE — Addendum Note (Signed)
Addendum  created 07/02/15 1519 by Duane Boston, MD   Modules edited: Anesthesia Responsible Staff

## 2015-07-11 ENCOUNTER — Ambulatory Visit (INDEPENDENT_AMBULATORY_CARE_PROVIDER_SITE_OTHER): Payer: Medicare Other | Admitting: Family Medicine

## 2015-07-11 VITALS — BP 148/84 | HR 95 | Temp 98.0°F | Resp 18 | Ht 64.5 in | Wt 117.4 lb

## 2015-07-11 DIAGNOSIS — M5431 Sciatica, right side: Secondary | ICD-10-CM

## 2015-07-11 MED ORDER — OXYCODONE-ACETAMINOPHEN 10-325 MG PO TABS: 1.0000 | ORAL_TABLET | Freq: Four times a day (QID) | ORAL | Status: DC | PRN

## 2015-07-11 NOTE — Addendum Note (Signed)
Addended by: [name] R on: 07/11/2015 12:11 PM   Modules accepted: Level of Service

## 2015-07-11 NOTE — Patient Instructions (Addendum)
Make sure you keep the follow-up for your MRI of your back, then follow-up with your back specialist for treatment of your pain. Continue the muscle relaxer, and then Percocet only if needed for breakthrough pain. I am concerned with long-term use of the oxycodone especially with history of addiction. I did refill this medication temporarily today, as we do not have the results of your MRI yet. However for any refills please return to discuss with Dr. Tamala Julian to make sure it is still an appropriate medication. If you feel you are becoming addicted to this medicine, please let us know as soon as possible so we can help you with this.  Return to the clinic or go to the nearest emergency room if any of your symptoms worsen or new symptoms occur.  [facility] Policy for Prescribing Controlled Substances (Revised 10/2012) 1. Prescriptions for controlled substances will be filled by ONE provider at [facility] with whom you have established and developed a plan for your care, including follow-up. 2. You are encouraged to schedule an appointment with your prescriber at our appointment center for follow-up visits whenever possible. 3. If you request a prescription for the controlled substance while at [facility] for an acute problem (with someone other than your regular prescriber), you MAY be given a ONE-TIME prescription for a 30-day supply of the controlled substance, to allow time for you to return to see your regular prescriber for additional prescriptions.  Sciatica Sciatica is pain, weakness, numbness, or tingling along the path of the sciatic nerve. The nerve starts in the lower back and runs down the back of each leg. The nerve controls the muscles in the lower leg and in the back of the knee, while also providing sensation to the back of the thigh, lower leg, and the sole of your foot. Sciatica is a symptom of another medical condition. For instance, nerve damage or certain conditions, such as a herniated disk or bone spur on  the spine, pinch or put pressure on the sciatic nerve. This causes the pain, weakness, or other sensations normally associated with sciatica. Generally, sciatica only affects one side of the body. CAUSES   Herniated or slipped disc.  Degenerative disk disease.  A pain disorder involving the narrow muscle in the buttocks (piriformis syndrome).  Pelvic injury or fracture.  Pregnancy.  Tumor (rare). SYMPTOMS  Symptoms can vary from mild to very severe. The symptoms usually travel from the low back to the buttocks and down the back of the leg. Symptoms can include:  Mild tingling or dull aches in the lower back, leg, or hip.  Numbness in the back of the calf or sole of the foot.  Burning sensations in the lower back, leg, or hip.  Sharp pains in the lower back, leg, or hip.  Leg weakness.  Severe back pain inhibiting movement. These symptoms may get worse with coughing, sneezing, laughing, or prolonged sitting or standing. Also, being overweight may worsen symptoms. DIAGNOSIS  Your caregiver will perform a physical exam to look for common symptoms of sciatica. He or she may ask you to do certain movements or activities that would trigger sciatic nerve pain. Other tests may be performed to find the cause of the sciatica. These may include:  Blood tests.  X-rays.  Imaging tests, such as an MRI or CT scan. TREATMENT  Treatment is directed at the cause of the sciatic pain. Sometimes, treatment is not necessary and the pain and discomfort goes away on its own. If treatment is  needed, your caregiver may suggest:  Over-the-counter medicines to relieve pain.  Prescription medicines, such as anti-inflammatory medicine, muscle relaxants, or narcotics.  Applying heat or ice to the painful area.  Steroid injections to lessen pain, irritation, and inflammation around the nerve.  Reducing activity during periods of pain.  Exercising and stretching to strengthen your abdomen and  improve flexibility of your spine. Your caregiver may suggest losing weight if the extra weight makes the back pain worse.  Physical therapy.  Surgery to eliminate what is pressing or pinching the nerve, such as a bone spur or part of a herniated disk. HOME CARE INSTRUCTIONS   Only take over-the-counter or prescription medicines for pain or discomfort as directed by your caregiver.  Apply ice to the affected area for 20 minutes, 3-4 times a day for the first 48-72 hours. Then try heat in the same way.  Exercise, stretch, or perform your usual activities if these do not aggravate your pain.  Attend physical therapy sessions as directed by your caregiver.  Keep all follow-up appointments as directed by your caregiver.  Do not wear high heels or shoes that do not provide proper support.  Check your mattress to see if it is too soft. A firm mattress may lessen your pain and discomfort. SEEK IMMEDIATE MEDICAL CARE IF:   You lose control of your bowel or bladder (incontinence).  You have increasing weakness in the lower back, pelvis, buttocks, or legs.  You have redness or swelling of your back.  You have a burning sensation when you urinate.  You have pain that gets worse when you lie down or awakens you at night.  Your pain is worse than you have experienced in the past.  Your pain is lasting longer than 4 weeks.  You are suddenly losing weight without reason. MAKE SURE YOU:  Understand these instructions.  Will watch your condition.  Will get help right away if you are not doing well or get worse. Document Released: 11/23/2001 Document Revised: 05/30/2012 Document Reviewed: 04/09/2012 [facility] Patient Information 2015 [place], [place]. This information is not intended to replace advice given to you by your health care provider. Make sure you discuss any questions you have with your health care provider.

## 2015-07-11 NOTE — Progress Notes (Signed)
Subjective:  This chart was scribed for Melissa Ray, MD by Thea Alken, ED Scribe. This patient was seen in room 2 and the patient's care was started at 10:29 A.M.   Patient ID: Melissa Bridges, female    DOB: [birth year], 54 y.o.   MRN: [id number]  HPI Chief Complaint  Patient presents with  . Hip Pain    hip pain all the way down to foot, right side   . Medication Refill    percocet    HPI Comments: Melissa Bridges is a 54 y.o. female who presents to the [facility] complaining of hip pain. She was diagnosed with right lower back with sciatic 6/15. Previously treated by Dr. Tamala Julian as well. Was referred to Dr. Lynann Bologna. She was advised in June medication could be given until appointment with Dr. Lynann Bologna. Appears an MRI of LS was order 8/2 but not completed. Was ordered on 6/22 but scheduled for 8/2. She was also treated with robaxin for muscle spasms. She was seen in f/u on 6/28 with Dr. Lorelei Pont who provided # 20 percocet until she could have MRI. She was hospitalized 7/15 for dental procedure with removal of bony areas of maxilla and mandible and was prescribed 40 percocet 10/325 by Dr. Stefanie Libel   Pt c/o low back and right hip pain, same sciatic pain as seen for prior. She denies new injury. She has taken pain medication and muscle relaxer in the past to help with sciatic pain. She takes robaxin 3 times a day and ran out her percocet 3 days ago. She is out of percocet prescribed by the dental surgeon and was taking 6 pills a day. She denies feeling addicted to pain medication but reports hx of cocaine addiction in college several years ago. She states once she has an MRI 8/2 she will f/u with Dr. Lynann Bologna. She had 40 percocet 10 mg prescribed 7/15, 20 prescribed 6/28,and 20 prescribed 6/16. She has had diarrhea but denies bladder and bowel incontinent, saddle anesthesia and lower extremity weakness.    Patient Active Problem List   Diagnosis Date Noted  . Elevated LFTs  09/09/2014  . Seizure disorder 09/09/2014  . Alcohol abuse 09/06/2014  . Routine general medical examination at a health care facility 06/12/2013  . Routine gynecological examination 06/12/2013  . Screening for diabetes mellitus 06/12/2013  . Asthma with acute exacerbation 03/09/2013  . Hypertension 03/09/2013  . Lower back pain 03/09/2013  . Hepatitis C 03/08/2013  . Smoker 12/12/2012  . Seizures 12/12/2012  . Schizoaffective disorder 12/12/2012  . Substance abuse 11/14/2011   Past Medical History  Diagnosis Date  . Sciatic pain   . Schizoaffective disorder   . Hepatitis C   . Arthritis   . Asthma   . Migraine   . Allergy   . Depression   . Anxiety   . Alopecia   . Substance abuse   . Hypertension     onset age 41.  [name] Shortness of breath dyspnea   . COPD (chronic obstructive pulmonary disease)   . Schizoaffective disorder   . GERD (gastroesophageal reflux disease)     o cc- takes OTC if needed.  . Seizures     onset in childhood.  Generalized tonic-clonic.Last seizure couple of months ago   Past Surgical History  Procedure Laterality Date  . Dilation and curettage of uterus    . Exploratory laparotomy      x3  . Ovarian cyst removal  2008  . Abdominal  hysterectomy      ovarian cyst B, cervical dysplasia, fibroids.   Ovaries intact.  . Gynecologic cryosurgery      x3  . Mandible reconstruction N/A 06/27/2015    Procedure: REMOVAL MAXILLARY PALATAL TORUS.  REMOVAL MANDIBULAR TORUS AND EXOSTOSIS.  ;  Surgeon: Diona Browner, DDS;  Location: [facility];  Service: Oral Surgery;  Laterality: N/A;   Allergies  Allergen Reactions  . Fruit & Vegetable Daily [Nutritional Supplements] Shortness Of Breath    Aloe  . Ibuprofen Other (See Comments)    Stomach upset; However, pt can take Meloxicam without incident and takes this at home.   . Aspirin Rash and Other (See Comments)    Stomach upset   Prior to Admission medications   Medication Sig Start Date End Date Taking?  Authorizing Provider  albuterol (PROVENTIL HFA;VENTOLIN HFA) 108 (90 BASE) MCG/ACT inhaler Inhale 2 puffs into the lungs every 6 (six) hours as needed for shortness of breath. 03/30/15  Yes Wardell Honour, MD  amLODipine (NORVASC) 5 MG tablet Take 1 tablet (5 mg total) by mouth daily. High blood pressure 12/21/14  Yes Posey Boyer, MD  amoxicillin (AMOXIL) 500 MG capsule Take 1 capsule (500 mg total) by mouth 3 (three) times daily. 06/27/15  Yes Diona Browner, DDS  budesonide-formoterol (SYMBICORT) 80-4.5 MCG/ACT inhaler Inhale 2 puffs into the lungs daily as needed (for shortness of breath). 12/21/14  Yes Posey Boyer, MD  citalopram (CELEXA) 10 MG tablet Take 1 tablet (10 mg total) by mouth daily. For depression 12/21/14  Yes Posey Boyer, MD  divalproex (DEPAKOTE) 500 MG DR tablet Take 500 mg by mouth 3 (three) times daily.   Yes Historical Provider, MD  ipratropium-albuterol (DUONEB) 0.5-2.5 (3) MG/3ML SOLN Take 3 mLs by nebulization every 4 (four) hours as needed (shortness of breath).    Yes Historical Provider, MD  methocarbamol (ROBAXIN) 750 MG tablet Take 1 tablet (750 mg total) by mouth every 8 (eight) hours as needed for muscle spasms. 05/29/15  Yes Elby Beck, FNP  QUEtiapine (SEROQUEL) 300 MG tablet Take 1 tablet (300 mg total) by mouth at bedtime. Mood control 12/21/14  Yes Posey Boyer, MD  oxyCODONE-acetaminophen (PERCOCET) 10-325 MG per tablet Take 1-2 tablets by mouth every 4 (four) hours as needed for pain. Patient not taking: Reported on 07/11/2015 06/27/15   Diona Browner, DDS   History   Social History  . Marital Status: Widowed    Spouse Name: N/A  . Number of Children: N/A  . Years of Education: N/A   Occupational History  . Not on file.   Social History Main Topics  . Smoking status: Current Every Day Smoker -- 0.33 packs/day for 44 years    Types: Cigarettes  . Smokeless tobacco: Never Used  . Alcohol Use: 8.4 oz/week    14 Cans of beer per week     Comment:  06/26/15- no alcohol since starting back on pscy meds- approx 6 months  . Drug Use: No     Comment: 06/26/15- dfenies  . Sexual Activity: Yes    Birth Control/ Protection: None     Comment: widow   Other Topics Concern  . Not on file   Social History Narrative   Marital status:  Widowed since 2002.  Married x 16 years.  Not dating.  Moved from [place] to live with daughter in 2013.     Children:  One child/daughter (33); two grandchildren.      Lives:  with daughter, grandchildren 2.  Does not drive due to epilepsy.      Employment:  Disability for schizoaffective disorder.      Tobacco: 1 ppd x since 8th grade.      Alcohol:  Social; rare drinking due to seizure medications.       Drugs: none; previous use of marijuana.  Previous iv drug use, cocaine.      Exercise: none      Seatbelt:  100%      Guns: none   Review of Systems  Constitutional: Negative for unexpected weight change.  Gastrointestinal: Positive for diarrhea. Negative for nausea and vomiting.  Genitourinary: Negative for enuresis and difficulty urinating.  Musculoskeletal: Positive for myalgias and back pain.  Neurological: Negative for weakness and numbness.    Objective:   Physical Exam  Constitutional: She is oriented to person, place, and time. She appears well-developed and well-nourished. No distress.  HENT:  Head: Normocephalic and atraumatic.  Eyes: Conjunctivae and EOM are normal.  Neck: Neck supple.  Cardiovascular: Normal rate.   Pulmonary/Chest: Effort normal.  Abdominal: There is no tenderness.  Musculoskeletal: Normal range of motion.  Postive seated straight leg with pain radiating from back down right leg with extension of right knee. Tenderness over lower lumbar spine on right. Most tender to right sciatic notch with pain to anterior hip. Able to heel toe walk without difficulty.   Neurological: She is alert and oriented to person, place, and time. She displays no Babinski's sign on the  right side. She displays no Babinski's sign on the left side.  Reflex Scores:      Patellar reflexes are 1+ on the right side and 2+ on the left side.      Achilles reflexes are 2+ on the right side and 2+ on the left side. Skin: Skin is warm and dry.  Psychiatric: She has a normal mood and affect. Her behavior is normal.  Nursing note and vitals reviewed.    Filed Vitals:   07/11/15 1006  BP: 148/84  Pulse: 95  Temp: 98 F (36.7 C)  TempSrc: Oral  Resp: 18  Height: 5' 4.5" (1.638 m)  Weight: 117 lb 6.4 oz (53.252 kg)  SpO2: 95%    Assessment & Plan:   Melissa Bridges is a 54 y.o. female Sciatica, right - Plan: oxyCODONE-acetaminophen (PERCOCET) 10-325 MG per tablet  - persistent, typical symptoms of her prior sciatica. MRI is pending, and has follow-up with back specialist.  - No red flags on history, but does have slight decreased patellar reflex on affected side. May have disc herniation contributing to her symptoms.  - Continue muscle relaxant she has this at home.  - Agreed to refill Percocet temporarily, but I discussed with her my concerns of addiction especially with cocaine addiction she had in the past. She denies current addiction symptoms  With oxycodone, but advised on symptoms or signs of this to watch out for.  Cautioned about persistent use of this medicine and ideally treat the back pain as soon as possible so we can wean off of this medicine.  - Refilled #20 of oxycodone, but discussed controlled substance policy at [facility] and plan to follow-up with Dr. Tamala Julian if needed for next refill.    Meds ordered this encounter  Medications  . oxyCODONE-acetaminophen (PERCOCET) 10-325 MG per tablet    Sig: Take 1 tablet by mouth every 6 (six) hours as needed for pain.    Dispense:  20 tablet  Refill:  0   Patient Instructions  Make sure you keep the follow-up for your MRI of your back, then follow-up with your back specialist for treatment of your pain. Continue the muscle  relaxer, and then Percocet only if needed for breakthrough pain. I am concerned with long-term use of the oxycodone especially with history of addiction. I did refill this medication temporarily today, as we do not have the results of your MRI yet. However for any refills please return to discuss with Dr. Tamala Julian to make sure it is still an appropriate medication. If you feel you are becoming addicted to this medicine, please let us know as soon as possible so we can help you with this.  Return to the clinic or go to the nearest emergency room if any of your symptoms worsen or new symptoms occur.  [facility] Policy for Prescribing Controlled Substances (Revised 10/2012) 1. Prescriptions for controlled substances will be filled by ONE provider at [facility] with whom you have established and developed a plan for your care, including follow-up. 2. You are encouraged to schedule an appointment with your prescriber at our appointment center for follow-up visits whenever possible. 3. If you request a prescription for the controlled substance while at [facility] for an acute problem (with someone other than your regular prescriber), you MAY be given a ONE-TIME prescription for a 30-day supply of the controlled substance, to allow time for you to return to see your regular prescriber for additional prescriptions.  Sciatica Sciatica is pain, weakness, numbness, or tingling along the path of the sciatic nerve. The nerve starts in the lower back and runs down the back of each leg. The nerve controls the muscles in the lower leg and in the back of the knee, while also providing sensation to the back of the thigh, lower leg, and the sole of your foot. Sciatica is a symptom of another medical condition. For instance, nerve damage or certain conditions, such as a herniated disk or bone spur on the spine, pinch or put pressure on the sciatic nerve. This causes the pain, weakness, or other sensations normally associated with sciatica.  Generally, sciatica only affects one side of the body. CAUSES   Herniated or slipped disc.  Degenerative disk disease.  A pain disorder involving the narrow muscle in the buttocks (piriformis syndrome).  Pelvic injury or fracture.  Pregnancy.  Tumor (rare). SYMPTOMS  Symptoms can vary from mild to very severe. The symptoms usually travel from the low back to the buttocks and down the back of the leg. Symptoms can include:  Mild tingling or dull aches in the lower back, leg, or hip.  Numbness in the back of the calf or sole of the foot.  Burning sensations in the lower back, leg, or hip.  Sharp pains in the lower back, leg, or hip.  Leg weakness.  Severe back pain inhibiting movement. These symptoms may get worse with coughing, sneezing, laughing, or prolonged sitting or standing. Also, being overweight may worsen symptoms. DIAGNOSIS  Your caregiver will perform a physical exam to look for common symptoms of sciatica. He or she may ask you to do certain movements or activities that would trigger sciatic nerve pain. Other tests may be performed to find the cause of the sciatica. These may include:  Blood tests.  X-rays.  Imaging tests, such as an MRI or CT scan. TREATMENT  Treatment is directed at the cause of the sciatic pain. Sometimes, treatment is not necessary and the pain and  discomfort goes away on its own. If treatment is needed, your caregiver may suggest:  Over-the-counter medicines to relieve pain.  Prescription medicines, such as anti-inflammatory medicine, muscle relaxants, or narcotics.  Applying heat or ice to the painful area.  Steroid injections to lessen pain, irritation, and inflammation around the nerve.  Reducing activity during periods of pain.  Exercising and stretching to strengthen your abdomen and improve flexibility of your spine. Your caregiver may suggest losing weight if the extra weight makes the back pain worse.  Physical  therapy.  Surgery to eliminate what is pressing or pinching the nerve, such as a bone spur or part of a herniated disk. HOME CARE INSTRUCTIONS   Only take over-the-counter or prescription medicines for pain or discomfort as directed by your caregiver.  Apply ice to the affected area for 20 minutes, 3-4 times a day for the first 48-72 hours. Then try heat in the same way.  Exercise, stretch, or perform your usual activities if these do not aggravate your pain.  Attend physical therapy sessions as directed by your caregiver.  Keep all follow-up appointments as directed by your caregiver.  Do not wear high heels or shoes that do not provide proper support.  Check your mattress to see if it is too soft. A firm mattress may lessen your pain and discomfort. SEEK IMMEDIATE MEDICAL CARE IF:   You lose control of your bowel or bladder (incontinence).  You have increasing weakness in the lower back, pelvis, buttocks, or legs.  You have redness or swelling of your back.  You have a burning sensation when you urinate.  You have pain that gets worse when you lie down or awakens you at night.  Your pain is worse than you have experienced in the past.  Your pain is lasting longer than 4 weeks.  You are suddenly losing weight without reason. MAKE SURE YOU:  Understand these instructions.  Will watch your condition.  Will get help right away if you are not doing well or get worse. Document Released: 11/23/2001 Document Revised: 05/30/2012 Document Reviewed: 04/09/2012 [facility] Patient Information 2015 [place], [place]. This information is not intended to replace advice given to you by your health care provider. Make sure you discuss any questions you have with your health care provider.      I personally performed the services described in this documentation, which was scribed in my presence. The recorded information has been reviewed and considered, and addended by me as needed.

## 2015-07-15 ENCOUNTER — Ambulatory Visit
Admission: RE | Admit: 2015-07-15 | Discharge: 2015-07-15 | Disposition: A | Discharge status: Home / Self Care | Payer: Medicare Other | Source: Ambulatory Visit | Attending: Orthopedic Surgery | Admitting: Orthopedic Surgery

## 2015-07-15 DIAGNOSIS — M4806 Spinal stenosis, lumbar region: Secondary | ICD-10-CM | POA: Diagnosis not present

## 2015-07-15 DIAGNOSIS — M5126 Other intervertebral disc displacement, lumbar region: Secondary | ICD-10-CM | POA: Diagnosis not present

## 2015-07-15 DIAGNOSIS — M545 Low back pain: Secondary | ICD-10-CM

## 2015-07-15 DIAGNOSIS — M47817 Spondylosis without myelopathy or radiculopathy, lumbosacral region: Secondary | ICD-10-CM | POA: Diagnosis not present

## 2015-07-16 ENCOUNTER — Encounter: Payer: Self-pay | Admitting: Internal Medicine

## 2015-07-16 ENCOUNTER — Ambulatory Visit (INDEPENDENT_AMBULATORY_CARE_PROVIDER_SITE_OTHER): Payer: Medicare Other | Admitting: Internal Medicine

## 2015-07-16 VITALS — BP 117/79 | HR 84 | Temp 98.2°F | Ht 63.0 in | Wt 119.0 lb

## 2015-07-16 DIAGNOSIS — B182 Chronic viral hepatitis C: Secondary | ICD-10-CM | POA: Diagnosis not present

## 2015-07-16 NOTE — Patient Instructions (Signed)
Date 07/16/2015  Dear Ms Melissa Bridges, As discussed in the [facility], your hepatitis C therapy will include the following medications:          Harvoni 90mg /400mg  tablet:           Take 1 tablet by mouth once daily   Please note that ALL MEDICATIONS WILL START ON THE SAME DATE for a total of 12 weeks. ---------------------------------------------------------------- Your HCV Treatment Start Date: TBA   Your HCV genotype:  1b    Liver Fibrosis: TBD    ---------------------------------------------------------------- YOUR PHARMACY CONTACT:   [place] [facility] [employer] [facility] [place] Phone: [phone] Hours: Monday to Friday 7:30 am to 6:00 pm   Please always contact your pharmacy at least 3-4 business days before you run out of medications to ensure your next month's medication is ready or 1 week prior to running out if you receive it by mail.  Remember, each prescription is for 28 days. ---------------------------------------------------------------- GENERAL NOTES REGARDING YOUR HEPATITIS C MEDICATION:  SOFOSBUVIR/LEDIPASVIR (HARVONI): - Harvoni tablet is taken daily with OR without food. - The tablets are orange. - The tablets should be stored at room temperature.  - Acid reducing agents such as H2 blockers (ie. Pepcid (famotidine), Zantac (ranitidine), Tagamet (cimetidine), Axid (nizatidine) and proton pump inhibitors (ie. Prilosec (omeprazole), Protonix (pantoprazole), Nexium (esomeprazole), or Aciphex (rabeprazole)) can decrease effectiveness of Harvoni. Do not take until you have discussed with a health care provider.    -Antacids that contain magnesium and/or aluminum hydroxide (ie. Milk of Magensia, Rolaids, Gaviscon, Maalox, Mylanta, an dArthritis Pain Formula)can reduce absorption of Harvoni, so take them at least 4 hours before or after Harvoni.  -Calcium carbonate (calcium supplements or antacids such as Tums, Caltrate,  Os-Cal)needs to be taken at least 4 hours hours before or after Harvoni.  -St. John's wort or any products that contain St. John's wort like some herbal supplements  Please inform the office prior to starting any of these medications.  - The common side effects with Harvoni:      1. Fatigue      2. Headache      3. Nausea      4. Diarrhea      5. Insomnia   Support Path is a suite of resources designed to help patients start with HARVONI and move toward treatment completion [place] helps patients access therapy and get off to an efficient start  Benefits investigation and prior authorization support Co-pay and other financial assistance A specialty pharmacy finder CO-PAY COUPON [facility] co-pay coupon may help eligible patients lower their out-of-pocket costs. With a co-pay coupon, most eligible patients may pay no more than $5 per co-pay (restrictions apply) www.harvoni.com call 6140024234 Not valid for patients enrolled in government healthcare prescription drug programs, such as Medicare Part D and Medicaid. Patients in the coverage gap known as the "donut hole" also are not eligible The HARVONI co-pay coupon program will cover the out-of-pocket costs for HARVONI prescriptions up to a maximum of 25% of the catalog price of a 12-week regimen of HARVONI  Please note that this only lists the most common side effects and is NOT a comprehensive list of the potential side effects of these medications. For more information, please review the drug information sheets that come with your medication package from the pharmacy.  ---------------------------------------------------------------- GENERAL HELPFUL HINTS ON HCV THERAPY: 1. No alcohol. 2. Protect against sun-sensitivity/sunburns (wear sunglasses, hat, long sleeves, pants  and sunscreen). 3. Stay well-hydrated/well-moisturized. 4. Notify the [facility] of any changes in your other over-the-counter/herbal or  prescription medications. 5. If you miss a dose of your medication, take the missed dose as soon as you remember. Return to your regular time/dose schedule the next day.  6.  Do not stop taking your medications without first talking with your healthcare provider. 7.  You may take Tylenol (acetaminophen), as long as the dose is less than 2000 mg (OR no more than 4 tablets of the Tylenol Extra Strengths '500mg'$  tablet) in 24 hours. 8.  You will need to obtain routine labs and/or office visits at [facility] at weeks 4 and 12 as well as 12 and 24 weeks after completion of treatment.   Scharlene Gloss, [place] for [place], [place]  [id number] [phone]

## 2015-07-16 NOTE — Progress Notes (Signed)
HPI:  +Melissa Bridges is a 54 y.o. female who presents for initial evaluation and management of chronic hepatitis C.  Patient tested positive she says in the 1980s. Hepatitis C risk factors present are: IV drug abuse (details: reports use in the 1980s). Patient denies multiple sexual partners, sexual contact with person with liver disease, tattoos. Patient has had other studies performed. Results: hepatitis C RNA by PCR, result: positive. Patient has not had prior treatment for Hepatitis C. Patient does not have a past history of liver disease. Patient does have a family history of liver disease.  Multiple family member with cirrhosis of the liver, some thought to have hepatitis C.  No liver cancer.     Patient does have documented immunity to Hepatitis A. Patient does have documented immunity to Hepatitis B.     Review of Systems A comprehensive review of systems was negative except for: Gastrointestinal: positive for abdominal pain   Past Medical History  Diagnosis Date  . Sciatic pain   . Schizoaffective disorder   . Hepatitis C   . Arthritis   . Asthma   . Migraine   . Allergy   . Depression   . Anxiety   . Alopecia   . Substance abuse   . Hypertension     onset age 33.  [name] Shortness of breath dyspnea   . COPD (chronic obstructive pulmonary disease)   . Schizoaffective disorder   . GERD (gastroesophageal reflux disease)     o cc- takes OTC if needed.  . Seizures     onset in childhood.  Generalized tonic-clonic.Last seizure couple of months ago    Prior to Admission medications   Medication Sig Start Date End Date Taking? Authorizing Provider  albuterol (PROVENTIL HFA;VENTOLIN HFA) 108 (90 BASE) MCG/ACT inhaler Inhale 2 puffs into the lungs every 6 (six) hours as needed for shortness of breath. 03/30/15   Wardell Honour, MD  amLODipine (NORVASC) 5 MG tablet Take 1 tablet (5 mg total) by mouth daily. High blood pressure 12/21/14   Posey Boyer, MD  amoxicillin (AMOXIL) 500 MG  capsule Take 1 capsule (500 mg total) by mouth 3 (three) times daily. 06/27/15   Diona Browner, DDS  budesonide-formoterol (SYMBICORT) 80-4.5 MCG/ACT inhaler Inhale 2 puffs into the lungs daily as needed (for shortness of breath). 12/21/14   Posey Boyer, MD  citalopram (CELEXA) 10 MG tablet Take 1 tablet (10 mg total) by mouth daily. For depression 12/21/14   Posey Boyer, MD  divalproex (DEPAKOTE) 500 MG DR tablet Take 500 mg by mouth 3 (three) times daily.    Historical Provider, MD  ipratropium-albuterol (DUONEB) 0.5-2.5 (3) MG/3ML SOLN Take 3 mLs by nebulization every 4 (four) hours as needed (shortness of breath).     Historical Provider, MD  methocarbamol (ROBAXIN) 750 MG tablet Take 1 tablet (750 mg total) by mouth every 8 (eight) hours as needed for muscle spasms. 05/29/15   Elby Beck, FNP  oxyCODONE-acetaminophen (PERCOCET) 10-325 MG per tablet Take 1 tablet by mouth every 6 (six) hours as needed for pain. 07/11/15   Wendie Agreste, MD  QUEtiapine (SEROQUEL) 300 MG tablet Take 1 tablet (300 mg total) by mouth at bedtime. Mood control 12/21/14   Posey Boyer, MD    Allergies  Allergen Reactions  . Fruit & Vegetable Daily [Nutritional Supplements] Shortness Of Breath    Aloe  . Ibuprofen Other (See Comments)    Stomach upset; However, pt can take Meloxicam  without incident and takes this at home.   . Aspirin Rash and Other (See Comments)    Stomach upset    History  Substance Use Topics  . Smoking status: Current Every Day Smoker -- 0.33 packs/day for 44 years    Types: Cigarettes  . Smokeless tobacco: Never Used  . Alcohol Use: 8.4 oz/week    14 Cans of beer per week     Comment: 06/26/15- no alcohol since starting back on pscy meds- approx 6 months    Family History  Problem Relation Age of Onset  . Diabetes type II Mother   . Hypertension Mother   . Arthritis Mother   . Diabetes type II Maternal Aunt   . Cancer Maternal Uncle   . Hypertension Father   . Heart  murmur Father   . Arthritis Father   . Alopecia Sister   . Alopecia Brother   . Alopecia Sister   . Mental retardation Sister       Objective:  There were no vitals filed for this visit. in no apparent distress and alert HEENT: anicteric Cor RRR and No murmurs clear Bowel sounds are normal, liver is not enlarged, spleen is not enlarged peripheral pulses normal, no pedal edema, no clubbing or cyanosis negative for - jaundice, spider hemangioma, telangiectasia, palmar erythema, ecchymosis and atrophy Musculoskeletal: no joint swelling  Laboratory Genotype:  Lab Results  Component Value Date   HCVGENOTYPE 1b 06/19/2015   HCV viral load:  Lab Results  Component Value Date   HCVQUANT [id number]* 06/19/2015   Lab Results  Component Value Date   WBC 6.4 06/27/2015   HGB 10.5* 06/27/2015   HCT 32.7* 06/27/2015   MCV 86.5 06/27/2015   PLT 243 06/27/2015    Lab Results  Component Value Date   CREATININE 0.91 06/27/2015   BUN <5* 06/27/2015   NA 136 06/27/2015   K 3.3* 06/27/2015   CL 101 06/27/2015   CO2 24 06/27/2015    Lab Results  Component Value Date   ALT 52 06/27/2015   AST 74* 06/27/2015   ALKPHOS 66 06/27/2015   BILITOT 0.6 06/27/2015   INR 1.08 06/19/2015      Assessment: Chronic Hepatitis C genotype 1b  Plan: 1) Patient counseled extensively on limiting acetaminophen to no more than 2 grams daily, avoidance of alcohol. 2) Transmission discussed with patient including sexual transmission, sharing razors and toothbrush.   3) Will need referral to gastroenterology if concern for cirrhosis 4) Will need referral for substance abuse counseling: Yes.   she is a daily drinker and needs counseling.  Gets withdrawal symptoms so will need a program.  Daughter with her and supportive.    5) Will prescribe Harvoni for 12 weeks once work up complete 6) Hepatitis A vaccine No. 7) Hepatitis B vaccine No. 8) Pneumovax vaccine if concern for cirrhosis 9) will follow up  after starting medication 10) occasional ppi but will use ranitidine at same time when needed

## 2015-07-23 ENCOUNTER — Ambulatory Visit: Payer: Self-pay | Admitting: Neurology

## 2015-07-23 ENCOUNTER — Telehealth: Payer: Self-pay

## 2015-07-23 DIAGNOSIS — M5431 Sciatica, right side: Secondary | ICD-10-CM

## 2015-07-23 MED ORDER — OXYCODONE-ACETAMINOPHEN 10-325 MG PO TABS: 1.0000 | ORAL_TABLET | Freq: Four times a day (QID) | ORAL | Status: DC | PRN

## 2015-07-23 NOTE — Telephone Encounter (Signed)
Controlled substance database reviewed. No listings since her last prescription from me.  As we discussed at her last visit and information given on her after visit summary, this medication needs to be filled by her primary care provider for further refills. What is the plan from Dr. Lynann Bologna since her MRI has now been completed?  As Dr. Tamala Julian is now out of the office today, I will make a one-time exception for a prescription of #4 of the percocet until she can be seen by Dr. Tamala Julian who is here at 4 p.m. tomorrow.  No further refills from me after this prescription, but she can discuss this medication with Dr. Tamala Julian.   To Dr. Tamala Julian - FYI.

## 2015-07-23 NOTE — Telephone Encounter (Signed)
Noted  

## 2015-07-23 NOTE — Telephone Encounter (Signed)
Patient is requesting refills for her pain medicine.  She said she needs an OV before refills can be prescribed, but she is unable to come in until tomorrow.  She is very emotional (crying on the phone) due to her situation and the pain, and would appreciate if she could get a refill.  If she could get a partial/temporary refill until she can come in to the office to see Dr. Tamala Julian, she would appreciate anything that can be done.  CB#: [phone].

## 2015-07-29 DIAGNOSIS — M5416 Radiculopathy, lumbar region: Secondary | ICD-10-CM | POA: Diagnosis not present

## 2015-08-05 ENCOUNTER — Ambulatory Visit (HOSPITAL_COMMUNITY): Payer: Medicare Other

## 2015-08-06 DIAGNOSIS — M5416 Radiculopathy, lumbar region: Secondary | ICD-10-CM | POA: Diagnosis not present

## 2015-08-07 ENCOUNTER — Encounter: Payer: Self-pay | Admitting: Family Medicine

## 2015-08-07 DIAGNOSIS — M5126 Other intervertebral disc displacement, lumbar region: Secondary | ICD-10-CM | POA: Insufficient documentation

## 2015-08-08 ENCOUNTER — Ambulatory Visit (INDEPENDENT_AMBULATORY_CARE_PROVIDER_SITE_OTHER): Payer: Medicare Other | Admitting: Family Medicine

## 2015-08-08 VITALS — BP 150/92 | HR 100 | Temp 98.6°F | Resp 16 | Ht 63.0 in | Wt 119.0 lb

## 2015-08-08 DIAGNOSIS — N898 Other specified noninflammatory disorders of vagina: Secondary | ICD-10-CM | POA: Diagnosis not present

## 2015-08-08 DIAGNOSIS — Z113 Encounter for screening for infections with a predominantly sexual mode of transmission: Secondary | ICD-10-CM

## 2015-08-08 DIAGNOSIS — B1710 Acute hepatitis C without hepatic coma: Secondary | ICD-10-CM

## 2015-08-08 DIAGNOSIS — Z7251 High risk heterosexual behavior: Secondary | ICD-10-CM | POA: Diagnosis not present

## 2015-08-08 DIAGNOSIS — F258 Other schizoaffective disorders: Secondary | ICD-10-CM | POA: Diagnosis not present

## 2015-08-08 DIAGNOSIS — M5431 Sciatica, right side: Secondary | ICD-10-CM | POA: Diagnosis not present

## 2015-08-08 DIAGNOSIS — Z5181 Encounter for therapeutic drug level monitoring: Secondary | ICD-10-CM | POA: Diagnosis not present

## 2015-08-08 DIAGNOSIS — Z202 Contact with and (suspected) exposure to infections with a predominantly sexual mode of transmission: Secondary | ICD-10-CM | POA: Diagnosis not present

## 2015-08-08 DIAGNOSIS — F250 Schizoaffective disorder, bipolar type: Secondary | ICD-10-CM | POA: Diagnosis not present

## 2015-08-08 LAB — POCT WET PREP WITH KOH
KOH Prep POC: NEGATIVE
RBC Wet Prep HPF POC: NEGATIVE
Trichomonas, UA: NEGATIVE
Yeast Wet Prep HPF POC: NEGATIVE

## 2015-08-08 MED ORDER — OXYCODONE-ACETAMINOPHEN 10-325 MG PO TABS: 1.0000 | ORAL_TABLET | Freq: Four times a day (QID) | ORAL | Status: DC | PRN

## 2015-08-08 MED ORDER — DIVALPROEX SODIUM 500 MG PO DR TAB: 500.0000 mg | DELAYED_RELEASE_TABLET | Freq: Three times a day (TID) | ORAL | Status: DC

## 2015-08-08 MED ORDER — QUETIAPINE FUMARATE 300 MG PO TABS: 300.0000 mg | ORAL_TABLET | Freq: Every day | ORAL | Status: DC

## 2015-08-08 NOTE — Patient Instructions (Signed)
I will be in touch with your labs Be sure to keep your upcoming appointment with Dr. Tamala Julian I will send your labs to you in the mail Continue to take your depaoke and seroquel as directed

## 2015-08-08 NOTE — Progress Notes (Signed)
[facility] [address] [place] [address] [phone]  Date:  08/08/2015   Name:  Melissa Bridges   DOB:  May 19, 1961   MRN:  [id number]  PCP:  Reginia Forts, MD    Chief Complaint: Sciatica; Arm Pain; Medication Refill; and Exposure to STD   History of Present Illness:  Melissa Bridges is a 54 y.o. very pleasant female patient who presents with the following:  Patient with somewhat complex mental health/ behavorial history.  Here today with concern regarding back pain Per our last note she was to have an MRI per Dr. Lynann Bologna on 8/2  MRI IMPRESSION: 1. Mild disc and moderate facet degeneration in the lumbar spine without significant interval change. 2. Unchanged right foraminal/extraforaminal disc protrusion at L4-5 potentially affecting the right L4 nerve root. 3. Unchanged, mild multifactorial spinal stenosis at L2-3 and L3-4.  Per pt they plan to treat her with PT; she has started doing this already.  Ortho will not treat her pain through medication according to the pt- they can give her epidural steroid shots OR surgery.  She was told to see her PCP for pain medication as needed.   However epidural steroids are not a good idea for her as it affects her mental health.  She is thinking about surgery but is not yet sure about this.    She is concerned about an STI- she did have intercourse earlier this summer and she was noted to have trich at our last visit in June.  She wants to be sure that his is clear  She also has a history of left shoulder pain  She has hepatitis C and they are in the process of getting this treated   She is on seroquel, depakote and also celexa- these are all on her behavorial health discharge summary from last year   Patient Active Problem List   Diagnosis Date Noted  . 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 Elevated LFTs 09/09/2014  . Seizure disorder 09/09/2014  . Alcohol abuse 09/06/2014  . Routine general medical  examination at a health care facility 06/12/2013  . Routine gynecological examination 06/12/2013  . Screening for diabetes mellitus 06/12/2013  . Asthma with acute exacerbation 03/09/2013  . Hypertension 03/09/2013  . Lower back pain 03/09/2013  . Chronic hepatitis C without hepatic coma 03/08/2013  . Smoker 12/12/2012  . Seizures 12/12/2012  . Schizoaffective disorder 12/12/2012  . Substance abuse 11/14/2011    Past Medical History  Diagnosis Date  . Sciatic pain   . Schizoaffective disorder   . Hepatitis C   . Arthritis   . Asthma   . Migraine   . Allergy   . Depression   . Anxiety   . Alopecia   . Substance abuse   . Hypertension     onset age 49.  [name] Shortness of breath dyspnea   . COPD (chronic obstructive pulmonary disease)   . Schizoaffective disorder   . GERD (gastroesophageal reflux disease)     o cc- takes OTC if needed.  . Seizures     onset in childhood.  Generalized tonic-clonic.Last seizure couple of months ago    Past Surgical History  Procedure Laterality Date  . Dilation and curettage of uterus    . Exploratory laparotomy      x3  . Ovarian cyst removal  2008  . Abdominal hysterectomy      ovarian cyst B, cervical dysplasia, fibroids.   Ovaries intact.  . Gynecologic cryosurgery  x3  . Mandible reconstruction N/A 06/27/2015    Procedure: REMOVAL MAXILLARY PALATAL TORUS.  REMOVAL MANDIBULAR TORUS AND EXOSTOSIS.  ;  Surgeon: Diona Browner, DDS;  Location: [facility];  Service: Oral Surgery;  Laterality: N/A;    Social History  Substance Use Topics  . Smoking status: Current Every Day Smoker -- 0.33 packs/day for 44 years    Types: Cigarettes  . Smokeless tobacco: Never Used  . Alcohol Use: 8.4 oz/week    14 Cans of beer per week     Comment: daily alcohol    Family History  Problem Relation Age of Onset  . Diabetes type II Mother   . Hypertension Mother   . Arthritis Mother   . Diabetes type II Maternal Aunt   . Cancer Maternal Uncle   .  Hypertension Father   . Heart murmur Father   . Arthritis Father   . Alopecia Sister   . Alopecia Brother   . Alopecia Sister   . Mental retardation Sister     Allergies  Allergen Reactions  . Fruit & Vegetable Daily [Nutritional Supplements] Shortness Of Breath    Aloe  . Ibuprofen Other (See Comments)    Stomach upset; However, pt can take Meloxicam without incident and takes this at home.   . Aspirin Rash and Other (See Comments)    Stomach upset    Medication list has been reviewed and updated.  Current Outpatient Prescriptions on File Prior to Visit  Medication Sig Dispense Refill  . albuterol (PROVENTIL HFA;VENTOLIN HFA) 108 (90 BASE) MCG/ACT inhaler Inhale 2 puffs into the lungs every 6 (six) hours as needed for shortness of breath. 1 Inhaler 11  . amLODipine (NORVASC) 5 MG tablet Take 1 tablet (5 mg total) by mouth daily. High blood pressure 30 tablet 5  . budesonide-formoterol (SYMBICORT) 80-4.5 MCG/ACT inhaler Inhale 2 puffs into the lungs daily as needed (for shortness of breath). 1 Inhaler 12  . citalopram (CELEXA) 10 MG tablet Take 1 tablet (10 mg total) by mouth daily. For depression 30 tablet 2  . divalproex (DEPAKOTE) 500 MG DR tablet Take 500 mg by mouth 3 (three) times daily.    [name] ipratropium-albuterol (DUONEB) 0.5-2.5 (3) MG/3ML SOLN Take 3 mLs by nebulization every 4 (four) hours as needed (shortness of breath).     . methocarbamol (ROBAXIN) 750 MG tablet Take 1 tablet (750 mg total) by mouth every 8 (eight) hours as needed for muscle spasms. 30 tablet 0  . oxyCODONE-acetaminophen (PERCOCET) 10-325 MG per tablet Take 1 tablet by mouth every 6 (six) hours as needed for pain. 4 tablet 0  . QUEtiapine (SEROQUEL) 300 MG tablet Take 1 tablet (300 mg total) by mouth at bedtime. Mood control 30 tablet 2   No current facility-administered medications on file prior to visit.    Review of Systems:  As per HPI- otherwise negative.   Physical Examination: Filed  Vitals:   08/08/15 1528  BP: 150/92  Pulse: 100  Temp: 98.6 F (37 C)  Resp: 16   Filed Vitals:   08/08/15 1528  Height: 5\' 3"  (1.6 m)  Weight: 119 lb (53.978 kg)   Body mass index is 21.09 kg/(m^2). Ideal Body Weight: Weight in (lb) to have BMI = 25: 140.8  GEN: WDWN, NAD, Non-toxic, A & O x 3, thin build, looks well HEENT: Atraumatic, Normocephalic. Neck supple. No masses, No LAD. Ears and Nose: No external deformity. CV: RRR, No M/G/R. No JVD. No thrill. No extra heart  sounds. PULM: CTA B, no wheezes, crackles, rhonchi. No retractions. No resp. distress. No accessory muscle use. ABD: S, NT, ND EXTR: No c/c/e NEURO Normal gait.  PSYCH: Normally interactive. Conversant. Not depressed or anxious appearing.  Calm demeanor.  Positive SLR on the right Normal pelvic exam- no lesions, discharge or masses  Results for orders placed or performed in visit on 08/08/15  POCT Wet Prep with KOH  Result Value Ref Range   Trichomonas, UA Negative    Clue Cells Wet Prep HPF POC 0-3    Epithelial Wet Prep HPF POC Moderate Few, Moderate, Many   Yeast Wet Prep HPF POC neg    Bacteria Wet Prep HPF POC Moderate (A) None, Few   RBC Wet Prep HPF POC neg    WBC Wet Prep HPF POC 6-8    KOH Prep POC Negative    Test of cure for trich- negative  Assessment and Plan: Sciatica, right - Plan: oxyCODONE-acetaminophen (PERCOCET) 10-325 MG per tablet  Routine screening for STI (sexually transmitted infection) - Plan: POCT Wet Prep with KOH, Hepatitis B surface antibody, Hepatitis B surface antigen, RPR, HIV antibody, GC/Chlamydia Probe Amp  Other schizoaffective disorders - Plan: divalproex (DEPAKOTE) 500 MG DR tablet  Medication monitoring encounter - Plan: Comprehensive metabolic panel, CBC, Valproic acid level  Acute hepatitis C virus infection without hepatic coma - Plan: HIV antibody, GC/Chlamydia Probe Amp  High risk sexual behavior - Plan: GC/Chlamydia Probe Amp  Vaginal discharge -  Plan: GC/Chlamydia Probe Amp  Exposure to STD - Plan: GC/Chlamydia Probe Amp  Schizoaffective disorder, bipolar type - Plan: QUEtiapine (SEROQUEL) 300 MG tablet  Refilled her usual depakote and seroquel.   STI screening per her request Refilled percocet which she uses for her spine disease as needed She will see Dr. Tamala Julian in October Will need to get her on a regular schedule of follow-up if we are going to treat her pain with chronic narcotics.  However I think her habit of seeing multiple providers is likely due to poor executive function rather than any mal intent, and she does have a real reason for her back pain  Signed Lamar Blinks, MD

## 2015-08-09 LAB — COMPREHENSIVE METABOLIC PANEL
ALT: 75 U/L — AB (ref 6–29)
AST: 127 U/L — ABNORMAL HIGH (ref 10–35)
Albumin: 4.6 g/dL (ref 3.6–5.1)
Alkaline Phosphatase: 95 U/L (ref 33–130)
BILIRUBIN TOTAL: 0.5 mg/dL (ref 0.2–1.2)
BUN: 7 mg/dL (ref 7–25)
CALCIUM: 10.4 mg/dL (ref 8.6–10.4)
CO2: 26 mmol/L (ref 20–31)
Chloride: 100 mmol/L (ref 98–110)
Creat: 0.82 mg/dL (ref 0.50–1.05)
Glucose, Bld: 84 mg/dL (ref 65–99)
POTASSIUM: 3.9 mmol/L (ref 3.5–5.3)
Sodium: 137 mmol/L (ref 135–146)
Total Protein: 8.6 g/dL — ABNORMAL HIGH (ref 6.1–8.1)

## 2015-08-09 LAB — CBC
HCT: 38.1 % (ref 36.0–46.0)
HEMOGLOBIN: 11.8 g/dL — AB (ref 12.0–15.0)
MCH: 25.9 pg — ABNORMAL LOW (ref 26.0–34.0)
MCHC: 31 g/dL (ref 30.0–36.0)
MCV: 83.7 fL (ref 78.0–100.0)
MPV: 10.4 fL (ref 8.6–12.4)
PLATELETS: 342 10*3/uL (ref 150–400)
RBC: 4.55 MIL/uL (ref 3.87–5.11)
RDW: 18.8 % — ABNORMAL HIGH (ref 11.5–15.5)
WBC: 8.8 10*3/uL (ref 4.0–10.5)

## 2015-08-09 LAB — RPR

## 2015-08-09 LAB — GC/CHLAMYDIA PROBE AMP
CT Probe RNA: NEGATIVE
GC Probe RNA: NEGATIVE

## 2015-08-09 LAB — HIV ANTIBODY (ROUTINE TESTING W REFLEX): HIV 1&2 Ab, 4th Generation: NONREACTIVE

## 2015-08-09 LAB — VALPROIC ACID LEVEL: Valproic Acid Lvl: 37.1 ug/mL — ABNORMAL LOW (ref 50.0–100.0)

## 2015-08-09 LAB — HEPATITIS B SURFACE ANTIBODY, QUANTITATIVE: HEPATITIS B-POST: 12 m[IU]/mL

## 2015-08-09 LAB — HEPATITIS B SURFACE ANTIGEN: HEP B S AG: NEGATIVE

## 2015-08-10 ENCOUNTER — Encounter: Payer: Self-pay | Admitting: Family Medicine

## 2015-08-19 ENCOUNTER — Ambulatory Visit (HOSPITAL_COMMUNITY): Admission: RE | Admit: 2015-08-19 | Payer: Medicare Other | Source: Ambulatory Visit

## 2015-08-25 ENCOUNTER — Encounter (HOSPITAL_COMMUNITY): Payer: Self-pay | Admitting: Emergency Medicine

## 2015-08-25 ENCOUNTER — Ambulatory Visit (INDEPENDENT_AMBULATORY_CARE_PROVIDER_SITE_OTHER): Payer: Medicare Other | Admitting: Family Medicine

## 2015-08-25 ENCOUNTER — Ambulatory Visit: Payer: Self-pay | Admitting: Internal Medicine

## 2015-08-25 ENCOUNTER — Emergency Department (HOSPITAL_COMMUNITY)
Admission: EM | Admit: 2015-08-25 | Discharge: 2015-08-26 | Disposition: A | Discharge status: Home / Self Care | Payer: Medicare Other | Attending: Emergency Medicine | Admitting: Emergency Medicine

## 2015-08-25 ENCOUNTER — Emergency Department (HOSPITAL_COMMUNITY): Payer: Medicare Other

## 2015-08-25 VITALS — BP 108/70 | HR 98 | Temp 98.3°F | Resp 16 | Ht 63.0 in | Wt 120.8 lb

## 2015-08-25 DIAGNOSIS — Z90710 Acquired absence of both cervix and uterus: Secondary | ICD-10-CM | POA: Insufficient documentation

## 2015-08-25 DIAGNOSIS — N941 Dyspareunia: Secondary | ICD-10-CM | POA: Diagnosis not present

## 2015-08-25 DIAGNOSIS — F329 Major depressive disorder, single episode, unspecified: Secondary | ICD-10-CM | POA: Insufficient documentation

## 2015-08-25 DIAGNOSIS — Z720 Tobacco use: Secondary | ICD-10-CM | POA: Diagnosis not present

## 2015-08-25 DIAGNOSIS — J449 Chronic obstructive pulmonary disease, unspecified: Secondary | ICD-10-CM | POA: Diagnosis not present

## 2015-08-25 DIAGNOSIS — G8929 Other chronic pain: Secondary | ICD-10-CM

## 2015-08-25 DIAGNOSIS — I10 Essential (primary) hypertension: Secondary | ICD-10-CM | POA: Diagnosis not present

## 2015-08-25 DIAGNOSIS — R1031 Right lower quadrant pain: Secondary | ICD-10-CM | POA: Insufficient documentation

## 2015-08-25 DIAGNOSIS — M1990 Unspecified osteoarthritis, unspecified site: Secondary | ICD-10-CM | POA: Diagnosis not present

## 2015-08-25 DIAGNOSIS — Z7951 Long term (current) use of inhaled steroids: Secondary | ICD-10-CM | POA: Diagnosis not present

## 2015-08-25 DIAGNOSIS — Z8619 Personal history of other infectious and parasitic diseases: Secondary | ICD-10-CM | POA: Insufficient documentation

## 2015-08-25 DIAGNOSIS — F259 Schizoaffective disorder, unspecified: Secondary | ICD-10-CM | POA: Diagnosis not present

## 2015-08-25 DIAGNOSIS — R100 Acute abdomen: Secondary | ICD-10-CM | POA: Diagnosis not present

## 2015-08-25 DIAGNOSIS — K5710 Diverticulosis of small intestine without perforation or abscess without bleeding: Secondary | ICD-10-CM | POA: Diagnosis not present

## 2015-08-25 DIAGNOSIS — K219 Gastro-esophageal reflux disease without esophagitis: Secondary | ICD-10-CM | POA: Diagnosis not present

## 2015-08-25 DIAGNOSIS — Z8679 Personal history of other diseases of the circulatory system: Secondary | ICD-10-CM | POA: Diagnosis not present

## 2015-08-25 DIAGNOSIS — Z872 Personal history of diseases of the skin and subcutaneous tissue: Secondary | ICD-10-CM | POA: Diagnosis not present

## 2015-08-25 DIAGNOSIS — F419 Anxiety disorder, unspecified: Secondary | ICD-10-CM | POA: Insufficient documentation

## 2015-08-25 DIAGNOSIS — Z79899 Other long term (current) drug therapy: Secondary | ICD-10-CM | POA: Diagnosis not present

## 2015-08-25 LAB — URINALYSIS, ROUTINE W REFLEX MICROSCOPIC
Bilirubin Urine: NEGATIVE
GLUCOSE, UA: NEGATIVE mg/dL
Hgb urine dipstick: NEGATIVE
Ketones, ur: NEGATIVE mg/dL
LEUKOCYTES UA: NEGATIVE
Nitrite: NEGATIVE
PH: 6.5 (ref 5.0–8.0)
Protein, ur: NEGATIVE mg/dL
Specific Gravity, Urine: 1.003 — ABNORMAL LOW (ref 1.005–1.030)
Urobilinogen, UA: 0.2 mg/dL (ref 0.0–1.0)

## 2015-08-25 LAB — COMPREHENSIVE METABOLIC PANEL
ALBUMIN: 4.5 g/dL (ref 3.5–5.0)
ALK PHOS: 93 U/L (ref 38–126)
ALT: 54 U/L (ref 14–54)
AST: 99 U/L — ABNORMAL HIGH (ref 15–41)
Anion gap: 10 (ref 5–15)
BUN: 11 mg/dL (ref 6–20)
CO2: 28 mmol/L (ref 22–32)
CREATININE: 0.85 mg/dL (ref 0.44–1.00)
Calcium: 9.9 mg/dL (ref 8.9–10.3)
Chloride: 99 mmol/L — ABNORMAL LOW (ref 101–111)
GFR calc Af Amer: >60 mL/min (ref >60)
GFR calc non Af Amer: >60 mL/min (ref >60)
GLUCOSE: 103 mg/dL — AB (ref 65–99)
Potassium: 3.4 mmol/L — ABNORMAL LOW (ref 3.5–5.1)
SODIUM: 137 mmol/L (ref 135–145)
Total Bilirubin: 0.5 mg/dL (ref 0.3–1.2)
Total Protein: 8.9 g/dL — ABNORMAL HIGH (ref 6.5–8.1)

## 2015-08-25 LAB — LIPASE, BLOOD: Lipase: 39 U/L (ref 22–51)

## 2015-08-25 LAB — CBC
HCT: 35.8 % — ABNORMAL LOW (ref 36.0–46.0)
HEMOGLOBIN: 11.4 g/dL — AB (ref 12.0–15.0)
MCH: 26.1 pg (ref 26.0–34.0)
MCHC: 31.8 g/dL (ref 30.0–36.0)
MCV: 82.1 fL (ref 78.0–100.0)
PLATELETS: 282 10*3/uL (ref 150–400)
RBC: 4.36 MIL/uL (ref 3.87–5.11)
RDW: 19.9 % — ABNORMAL HIGH (ref 11.5–15.5)
WBC: 10 10*3/uL (ref 4.0–10.5)

## 2015-08-25 MED ORDER — SODIUM CHLORIDE 0.9 % IV SOLN
INTRAVENOUS | Status: DC
  Administered 2015-08-25: 22:00:00 via INTRAVENOUS

## 2015-08-25 MED ORDER — IOHEXOL 300 MG/ML  SOLN
100.0000 mL | Freq: Once | INTRAMUSCULAR | Status: AC | PRN
  Administered 2015-08-25: 100 mL via INTRAVENOUS

## 2015-08-25 NOTE — ED Provider Notes (Signed)
CSN: [id number]     Arrival date & time 08/25/15  1742 History   First MD Initiated Contact with Patient 08/25/15 2122     Chief Complaint  Patient presents with  . Abdominal Pain     (Consider location/radiation/quality/duration/timing/severity/associated sxs/prior Treatment) HPI Comments: Patient here complaining of rated a 6 month history of right lower quadrant colicky abdominal pain. Seen at urgent care today and those notes reviewed and patient's exam shows severe right lower quadrant tenderness. Patient sent to ED for further management. Patient states that her pain is gradually resolve. She's not using treatment prior to arrival. No fever or chills. No nausea vomiting or diarrhea. Symptoms were worse with intercourse several days ago but she denies any current vaginal bleeding or discharge. No dysuria appreciated.  Patient is a 54 y.o. female presenting with abdominal pain. The history is provided by the patient.  Abdominal Pain   Past Medical History  Diagnosis Date  . Sciatic pain   . Schizoaffective disorder   . Hepatitis C   . Arthritis   . Asthma   . Migraine   . Allergy   . Depression   . Anxiety   . Alopecia   . Substance abuse   . Hypertension     onset age 53.  [name] Shortness of breath dyspnea   . COPD (chronic obstructive pulmonary disease)   . Schizoaffective disorder   . GERD (gastroesophageal reflux disease)     o cc- takes OTC if needed.  . Seizures     onset in childhood.  Generalized tonic-clonic.Last seizure couple of months ago   Past Surgical History  Procedure Laterality Date  . Dilation and curettage of uterus    . Exploratory laparotomy      x3  . Ovarian cyst removal  2008  . Abdominal hysterectomy      ovarian cyst B, cervical dysplasia, fibroids.   Ovaries intact.  . Gynecologic cryosurgery      x3  . Mandible reconstruction N/A 06/27/2015    Procedure: REMOVAL MAXILLARY PALATAL TORUS.  REMOVAL MANDIBULAR TORUS AND EXOSTOSIS.  ;  Surgeon:  Diona Browner, DDS;  Location: [facility];  Service: Oral Surgery;  Laterality: N/A;   Family History  Problem Relation Age of Onset  . Diabetes type II Mother   . Hypertension Mother   . Arthritis Mother   . Diabetes type II Maternal Aunt   . Cancer Maternal Uncle   . Hypertension Father   . Heart murmur Father   . Arthritis Father   . Alopecia Sister   . Alopecia Brother   . Alopecia Sister   . Mental retardation Sister    Social History  Substance Use Topics  . Smoking status: Current Every Day Smoker -- 0.33 packs/day for 44 years    Types: Cigarettes  . Smokeless tobacco: Never Used  . Alcohol Use: 8.4 oz/week    14 Cans of beer per week     Comment: daily alcohol   OB History    No data available     Review of Systems  Gastrointestinal: Positive for abdominal pain.  All other systems reviewed and are negative.     Allergies  Fruit & vegetable daily; Ibuprofen; and Aspirin  Home Medications   Prior to Admission medications   Medication Sig Start Date End Date Taking? Authorizing Provider  albuterol (PROVENTIL HFA;VENTOLIN HFA) 108 (90 BASE) MCG/ACT inhaler Inhale 2 puffs into the lungs every 6 (six) hours as needed for shortness of breath.  03/30/15   Wardell Honour, MD  amLODipine (NORVASC) 5 MG tablet Take 1 tablet (5 mg total) by mouth daily. High blood pressure 12/21/14   Posey Boyer, MD  budesonide-formoterol [facility]) 80-4.5 MCG/ACT inhaler Inhale 2 puffs into the lungs daily as needed (for shortness of breath). 12/21/14   Posey Boyer, MD  citalopram (CELEXA) 10 MG tablet Take 1 tablet (10 mg total) by mouth daily. For depression 12/21/14   Posey Boyer, MD  divalproex (DEPAKOTE) 500 MG DR tablet Take 1 tablet (500 mg total) by mouth 3 (three) times daily. 08/08/15   Gay Filler Copland, MD  ipratropium-albuterol (DUONEB) 0.5-2.5 (3) MG/3ML SOLN Take 3 mLs by nebulization every 4 (four) hours as needed (shortness of breath).     Historical Provider, MD    methocarbamol (ROBAXIN) 750 MG tablet Take 1 tablet (750 mg total) by mouth every 8 (eight) hours as needed for muscle spasms. 05/29/15   Elby Beck, FNP  oxyCODONE-acetaminophen (PERCOCET) 10-325 MG per tablet Take 1 tablet by mouth every 6 (six) hours as needed for pain. Patient not taking: Reported on 08/25/2015 08/08/15   Darreld Mclean, MD  QUEtiapine (SEROQUEL) 300 MG tablet Take 1 tablet (300 mg total) by mouth at bedtime. Mood control 08/08/15   Gay Filler Copland, MD   BP 127/68 mmHg  Pulse 82  Temp(Src) 98.2 F (36.8 C) (Oral)  Resp 16  SpO2 96% Physical Exam  Constitutional: She is oriented to person, place, and time. She appears well-developed and well-nourished.  Non-toxic appearance. No distress.  HENT:  Head: Normocephalic and atraumatic.  Eyes: Conjunctivae, EOM and lids are normal. Pupils are equal, round, and reactive to light.  Neck: Normal range of motion. Neck supple. No tracheal deviation present. No thyroid mass present.  Cardiovascular: Normal rate, regular rhythm and normal heart sounds.  Exam reveals no gallop.   No murmur heard. Pulmonary/Chest: Effort normal and breath sounds normal. No stridor. No respiratory distress. She has no decreased breath sounds. She has no wheezes. She has no rhonchi. She has no rales.  Abdominal: Soft. Normal appearance and bowel sounds are normal. She exhibits no distension. There is tenderness in the right lower quadrant. There is no rigidity, no rebound, no guarding and no CVA tenderness.    Musculoskeletal: Normal range of motion. She exhibits no edema or tenderness.  Neurological: She is alert and oriented to person, place, and time. She has normal strength. No cranial nerve deficit or sensory deficit. GCS eye subscore is 4. GCS verbal subscore is 5. GCS motor subscore is 6.  Skin: Skin is warm and dry. No abrasion and no rash noted.  Psychiatric: She has a normal mood and affect. Her speech is normal and behavior is  normal.  Nursing note and vitals reviewed.   ED Course  Procedures (including critical care time) Labs Review Labs Reviewed  COMPREHENSIVE METABOLIC PANEL - Abnormal; Notable for the following:    Potassium 3.4 (*)    Chloride 99 (*)    Glucose, Bld 103 (*)    Total Protein 8.9 (*)    AST 99 (*)    All other components within normal limits  CBC - Abnormal; Notable for the following:    Hemoglobin 11.4 (*)    HCT 35.8 (*)    RDW 19.9 (*)    All other components within normal limits  URINALYSIS, ROUTINE W REFLEX MICROSCOPIC (NOT AT [facility]) - Abnormal; Notable for the following:    APPearance  CLOUDY (*)    Specific Gravity, Urine 1.003 (*)    All other components within normal limits  LIPASE, BLOOD    Imaging Review No results found. I have personally reviewed and evaluated these images and lab results as part of my medical decision-making.   EKG Interpretation None      MDM   Final diagnoses:  None    Abdominal CT negative here. For acute findings. Patient stable for discharge repeat exam at time of discharge she is pain-free    Lacretia Leigh, MD 08/26/15 0000

## 2015-08-25 NOTE — ED Notes (Signed)
Pt c/o right lower abdominal pain for several months changed from intermittent to constant pain in past couple weeks, pt c/o painful intercourse as well.

## 2015-08-25 NOTE — Progress Notes (Addendum)
This chart was scribed for Melissa Haber, MD by Melissa Bridges, medical scribe at [facility].The patient was seen in exam room 12 and the patient's care was started at 5:04 PM.  Patient ID: Melissa Bridges MRN: [id number], DOB: [birth year], 54 y.o. Date of Encounter: 08/25/2015  Primary Physician: Melissa Forts, MD  Chief Complaint:  Chief Complaint  Patient presents with   Abdominal Pain    right side lower, extends to lower back   STD testing   Immunizations    flu vaccine    HPI:  Melissa Bridges is a 55 y.o. female who presents to [facility] complaining of intermittent right lower abdominal pain for the past month. She saw her Dr. Tamala Bridges with this issue a few months ago, but the symptoms persisted. She also complains constipation and when she can finally go to the bathroom, she feels really sick. She tried having intercourse and felt the pain constantly. She has fever, chills and sweats at night. It hurts her when she lays down because of her sciatica on her right side. She denies vaginal discharge, and UTI symptoms. Symptoms have significantly worsened over the weekend to the point where patient has difficulty lying down flat or moving.  She sees Dr. Tamala Bridges for PCP.   Past Medical History  Diagnosis Date   Sciatic pain    Schizoaffective disorder    Hepatitis C    Arthritis    Asthma    Migraine    Allergy    Depression    Anxiety    Alopecia    Substance abuse    Hypertension     onset age 40.   Shortness of breath dyspnea    COPD (chronic obstructive pulmonary disease)    Schizoaffective disorder    GERD (gastroesophageal reflux disease)     o cc- takes OTC if needed.   Seizures     onset in childhood.  Generalized tonic-clonic.Last seizure couple of months ago     Home Meds: Prior to Admission medications   Medication Sig Start Date End Date Taking? Authorizing Provider  albuterol (PROVENTIL HFA;VENTOLIN  HFA) 108 (90 BASE) MCG/ACT inhaler Inhale 2 puffs into the lungs every 6 (six) hours as needed for shortness of breath. 03/30/15  Yes Melissa Honour, MD  amLODipine (NORVASC) 5 MG tablet Take 1 tablet (5 mg total) by mouth daily. High Bridges pressure 12/21/14  Yes Melissa Boyer, MD  budesonide-formoterol [facility]) 80-4.5 MCG/ACT inhaler Inhale 2 puffs into the lungs daily as needed (for shortness of breath). 12/21/14  Yes Melissa Boyer, MD  citalopram (CELEXA) 10 MG tablet Take 1 tablet (10 mg total) by mouth daily. For depression 12/21/14  Yes Melissa Boyer, MD  divalproex (DEPAKOTE) 500 MG DR tablet Take 1 tablet (500 mg total) by mouth 3 (three) times daily. 08/08/15  Yes Melissa Filler Copland, MD  ipratropium-albuterol (DUONEB) 0.5-2.5 (3) MG/3ML SOLN Take 3 mLs by nebulization every 4 (four) hours as needed (shortness of breath).    Yes Historical Provider, MD  methocarbamol (ROBAXIN) 750 MG tablet Take 1 tablet (750 mg total) by mouth every 8 (eight) hours as needed for muscle spasms. 05/29/15  Yes Melissa Beck, FNP  QUEtiapine (SEROQUEL) 300 MG tablet Take 1 tablet (300 mg total) by mouth at bedtime. Mood control 08/08/15  Yes Melissa Filler Copland, MD  oxyCODONE-acetaminophen (PERCOCET) 10-325 MG per tablet Take 1 tablet by mouth every 6 (six) hours as needed for pain.  Patient not taking: Reported on 08/25/2015 08/08/15   Melissa Mclean, MD    Allergies:  Allergies  Allergen Reactions   Fruit & Vegetable Daily [Nutritional Supplements] Shortness Of Breath    Aloe   Ibuprofen Other (See Comments)    Stomach upset; However, pt can take Meloxicam without incident and takes this at home.    Aspirin Rash and Other (See Comments)    Stomach upset    Social History   Social History   Marital Status: Widowed    Spouse Name: N/A   Number of Children: N/A   Years of Education: N/A   Occupational History   Not on file.   Social History Main Topics   Smoking status: Current Every Day  Smoker -- 0.33 packs/day for 44 years    Types: Cigarettes   Smokeless tobacco: Never Used   Alcohol Use: 8.4 oz/week    14 Cans of beer per week     Comment: daily alcohol   Drug Use: No     Comment: 06/26/15- dfenies   Sexual Activity: Yes    Birth Control/ Protection: None     Comment: widow   Other Topics Concern   Not on file   Social History Narrative   Marital status:  Widowed since 2002.  Married x 16 years.  Not dating.  Moved from [place] to live with daughter in 2013.     Children:  One child/daughter (33); two grandchildren.      Lives: with daughter, grandchildren 2.  Does not drive due to epilepsy.      Employment:  Disability for schizoaffective disorder.      Tobacco: 1 ppd x since 8th grade.      Alcohol:  Social; rare drinking due to seizure medications.       Drugs: none; previous use of marijuana.  Previous iv drug use, cocaine.      Exercise: none      Seatbelt:  100%      Guns: none     Review of Systems: Constitutional: negative for weight changes, or fatigue; positive for fever, chills, night sweats,  HEENT: negative for vision changes, hearing loss, congestion, rhinorrhea, ST, epistaxis, or sinus pressure Cardiovascular: negative for chest pain or palpitations Respiratory: negative for hemoptysis, wheezing, shortness of breath, or cough Abdominal: negative for nausea, vomiting, diarrhea; positive for constipation, abdominal pain Dermatological: negative for rash Neurologic: negative for headache, dizziness, or syncope GU: negative for UTI symptoms All other systems reviewed and are otherwise negative with the exception to those above and in the HPI.  Physical Exam: Bridges pressure 108/70, pulse 98, temperature 98.3 F (36.8 C), temperature source Oral, resp. rate 16, height 5\' 3"  (1.6 m), weight 120 lb 12.8 oz (54.795 kg), SpO2 98 %., Body mass index is 21.4 kg/(m^2). General: Well developed, well nourished, in no acute distress. Head:  Normocephalic, atraumatic, eyes without discharge, sclera non-icteric, nares are without discharge. Bilateral auditory canals clear, TM's are without perforation, pearly grey and translucent with reflective cone of light bilaterally. Oral cavity moist, posterior pharynx without exudate, erythema, peritonsillar abscess, or post nasal drip.  Neck: Supple. No thyromegaly. Full ROM. No lymphadenopathy. Lungs: Clear bilaterally to auscultation without wheezes, rales, or rhonchi. Breathing is unlabored. Heart: RRR with S1 S2. No murmurs, rubs, or gallops appreciated. Abdomen: Soft, with guarding and rebound in the right lower quadrant. Patient's very tender in the right lower quadrant is well although a do not feel a mass. There is no  HSM. Msk:  Strength and tone normal for age. Extremities/Skin: Warm and dry. No clubbing or cyanosis. No edema. No rashes or suspicious lesions. Neuro: Alert and oriented X 3. Moves all extremities spontaneously. Gait is normal. CNII-XII grossly in tact. Psych:  Responds to questions appropriately with a normal affect.   Wt Readings from Last 3 Encounters:  08/25/15 120 lb 12.8 oz (54.795 kg)  08/08/15 119 lb (53.978 kg)  07/16/15 119 lb (53.978 kg)    ASSESSMENT AND PLAN:  54 y.o. year old female with progressively's severe right lower quadrant abdominal pain, now with guarding and rebound, and associated with night sweats and fevers. Patient is directed to go to the emergency room at [facility] area she's going to have her daughter take her there. This chart was scribed in my presence and reviewed by me personally.    ICD-9-CM ICD-10-CM   1. Abdominal pain, chronic, right lower quadrant 789.03 R10.31    338.29 G89.29   2. Acute abdomen 789.00 R10.0      Signed, Melissa Haber, MD  Signed, Melissa Haber, MD 08/25/2015 5:04 PM

## 2015-08-26 NOTE — Discharge Instructions (Signed)
Abdominal Pain °Many things can cause abdominal pain. Usually, abdominal pain is not caused by a disease and will improve without treatment. It can often be observed and treated at home. Your health care provider will do a physical exam and possibly order blood tests and X-rays to help determine the seriousness of your pain. However, in many cases, more time must pass before a clear cause of the pain can be found. Before that point, your health care provider may not know if you need more testing or further treatment. °HOME CARE INSTRUCTIONS  °Monitor your abdominal pain for any changes. The following actions may help to alleviate any discomfort you are experiencing: °· Only take over-the-counter or prescription medicines as directed by your health care provider. °· Do not take laxatives unless directed to do so by your health care provider. °· Try a clear liquid diet (broth, tea, or water) as directed by your health care provider. Slowly move to a bland diet as tolerated. °SEEK MEDICAL CARE IF: °· You have unexplained abdominal pain. °· You have abdominal pain associated with nausea or diarrhea. °· You have pain when you urinate or have a bowel movement. °· You experience abdominal pain that wakes you in the night. °· You have abdominal pain that is worsened or improved by eating food. °· You have abdominal pain that is worsened with eating fatty foods. °· You have a fever. °SEEK IMMEDIATE MEDICAL CARE IF:  °· Your pain does not go away within 2 hours. °· You keep throwing up (vomiting). °· Your pain is felt only in portions of the abdomen, such as the right side or the left lower portion of the abdomen. °· You pass bloody or black tarry stools. °MAKE SURE YOU: °· Understand these instructions.   °· Will watch your condition.   °· Will get help right away if you are not doing well or get worse.   °Document Released: 09/08/2005 Document Revised: 12/04/2013 Document Reviewed: 08/08/2013 °Exit[employer]® Patient Information  ©2015 [employer]. This information is not intended to replace advice given to you by your health care provider. Make sure you discuss any questions you have with your health care provider. ° °Abdominal Pain, Women °Abdominal (stomach, pelvic, or belly) pain can be caused by many things. It is important to tell your doctor: °· The location of the pain. °· Does it come and go or is it present all the time? °· Are there things that start the pain (eating certain foods, exercise)? °· Are there other symptoms associated with the pain (fever, nausea, vomiting, diarrhea)? °All of this is helpful to know when trying to find the cause of the pain. °CAUSES  °· Stomach: virus or bacteria infection, or ulcer. °· Intestine: appendicitis (inflamed appendix), regional ileitis (Crohn's disease), ulcerative colitis (inflamed colon), irritable bowel syndrome, diverticulitis (inflamed diverticulum of the colon), or cancer of the stomach or intestine. °· Gallbladder disease or stones in the gallbladder. °· Kidney disease, kidney stones, or infection. °· Pancreas infection or cancer. °· Fibromyalgia (pain disorder). °· Diseases of the female organs: °¨ Uterus: fibroid (non-cancerous) tumors or infection. °¨ Fallopian tubes: infection or tubal pregnancy. °¨ Ovary: cysts or tumors. °¨ Pelvic adhesions (scar tissue). °¨ Endometriosis (uterus lining tissue growing in the pelvis and on the pelvic organs). °¨ Pelvic congestion syndrome (female organs filling up with blood just before the menstrual period). °¨ Pain with the menstrual period. °¨ Pain with ovulation (producing an egg). °¨ Pain with an IUD (intrauterine device, birth control) in the uterus. °¨   Cancer of the female organs. °· Functional pain (pain not caused by a disease, may improve without treatment). °· Psychological pain. °· Depression. °DIAGNOSIS  °Your doctor will decide the seriousness of your pain by doing an examination. °· Blood tests. °· X-rays. °· Ultrasound. °· CT  scan (computed tomography, special type of X-ray). °· MRI (magnetic resonance imaging). °· Cultures, for infection. °· Barium enema (dye inserted in the large intestine, to better view it with X-rays). °· Colonoscopy (looking in intestine with a lighted tube). °· Laparoscopy (minor surgery, looking in abdomen with a lighted tube). °· Major abdominal exploratory surgery (looking in abdomen with a large incision). °TREATMENT  °The treatment will depend on the cause of the pain.  °· Many cases can be observed and treated at home. °· Over-the-counter medicines recommended by your caregiver. °· Prescription medicine. °· Antibiotics, for infection. °· Birth control pills, for painful periods or for ovulation pain. °· Hormone treatment, for endometriosis. °· Nerve blocking injections. °· Physical therapy. °· Antidepressants. °· Counseling with a psychologist or psychiatrist. °· Minor or major surgery. °HOME CARE INSTRUCTIONS  °· Do not take laxatives, unless directed by your caregiver. °· Take over-the-counter pain medicine only if ordered by your caregiver. Do not take aspirin because it can cause an upset stomach or bleeding. °· Try a clear liquid diet (broth or water) as ordered by your caregiver. Slowly move to a bland diet, as tolerated, if the pain is related to the stomach or intestine. °· Have a thermometer and take your temperature several times a day, and record it. °· Bed rest and sleep, if it helps the pain. °· Avoid sexual intercourse, if it causes pain. °· Avoid stressful situations. °· Keep your follow-up appointments and tests, as your caregiver orders. °· If the pain does not go away with medicine or surgery, you may try: °¨ Acupuncture. °¨ Relaxation exercises (yoga, meditation). °¨ Group therapy. °¨ Counseling. °SEEK MEDICAL CARE IF:  °· You notice certain foods cause stomach pain. °· Your home care treatment is not helping your pain. °· You need stronger pain medicine. °· You want your IUD  removed. °· You feel faint or lightheaded. °· You develop nausea and vomiting. °· You develop a rash. °· You are having side effects or an allergy to your medicine. °SEEK IMMEDIATE MEDICAL CARE IF:  °· Your pain does not go away or gets worse. °· You have a fever. °· Your pain is felt only in portions of the abdomen. The right side could possibly be appendicitis. The left lower portion of the abdomen could be colitis or diverticulitis. °· You are passing blood in your stools (bright red or black tarry stools, with or without vomiting). °· You have blood in your urine. °· You develop chills, with or without a fever. °· You pass out. °MAKE SURE YOU:  °· Understand these instructions. °· Will watch your condition. °· Will get help right away if you are not doing well or get worse. °Document Released: 09/26/2007 Document Revised: 04/15/2014 Document Reviewed: 10/16/2009 °ExitCare® Patient Information ©2015 [employer]. This information is not intended to replace advice given to you by your health care provider. Make sure you discuss any questions you have with your health care provider. ° °

## 2015-08-26 NOTE — ED Notes (Signed)
Patient was alert, oriented and stable upon discharge. RN went over AVS and patient had no further questions.  

## 2015-09-10 ENCOUNTER — Ambulatory Visit: Payer: Self-pay | Admitting: Neurology

## 2015-09-18 ENCOUNTER — Ambulatory Visit (INDEPENDENT_AMBULATORY_CARE_PROVIDER_SITE_OTHER): Payer: Medicare Other | Admitting: Family Medicine

## 2015-09-18 VITALS — BP 120/80 | HR 88 | Temp 98.0°F | Resp 18 | Ht 63.0 in | Wt 118.0 lb

## 2015-09-18 DIAGNOSIS — F5089 Other specified eating disorder: Secondary | ICD-10-CM | POA: Diagnosis not present

## 2015-09-18 DIAGNOSIS — R1031 Right lower quadrant pain: Secondary | ICD-10-CM | POA: Diagnosis not present

## 2015-09-18 DIAGNOSIS — G8929 Other chronic pain: Secondary | ICD-10-CM

## 2015-09-18 DIAGNOSIS — R4589 Other symptoms and signs involving emotional state: Secondary | ICD-10-CM

## 2015-09-18 DIAGNOSIS — R450 Nervousness: Secondary | ICD-10-CM

## 2015-09-18 DIAGNOSIS — M5441 Lumbago with sciatica, right side: Secondary | ICD-10-CM | POA: Diagnosis not present

## 2015-09-18 DIAGNOSIS — M5126 Other intervertebral disc displacement, lumbar region: Secondary | ICD-10-CM

## 2015-09-18 DIAGNOSIS — F39 Unspecified mood [affective] disorder: Secondary | ICD-10-CM | POA: Diagnosis not present

## 2015-09-18 DIAGNOSIS — D509 Iron deficiency anemia, unspecified: Secondary | ICD-10-CM

## 2015-09-18 LAB — POCT URINALYSIS DIP (MANUAL ENTRY)
GLUCOSE UA: NEGATIVE
Leukocytes, UA: NEGATIVE
Nitrite, UA: NEGATIVE
PH UA: 7
SPEC GRAV UA: 1.025
Urobilinogen, UA: 1

## 2015-09-18 LAB — COMPREHENSIVE METABOLIC PANEL
ALBUMIN: 4.7 g/dL (ref 3.6–5.1)
ALT: 61 U/L — ABNORMAL HIGH (ref 6–29)
AST: 108 U/L — ABNORMAL HIGH (ref 10–35)
Alkaline Phosphatase: 87 U/L (ref 33–130)
BILIRUBIN TOTAL: 1 mg/dL (ref 0.2–1.2)
BUN: 8 mg/dL (ref 7–25)
CHLORIDE: 100 mmol/L (ref 98–110)
CO2: 30 mmol/L (ref 20–31)
CREATININE: 1.06 mg/dL — AB (ref 0.50–1.05)
Calcium: 10.4 mg/dL (ref 8.6–10.4)
Glucose, Bld: 103 mg/dL — ABNORMAL HIGH (ref 65–99)
Potassium: 3.5 mmol/L (ref 3.5–5.3)
SODIUM: 138 mmol/L (ref 135–146)
TOTAL PROTEIN: 8.9 g/dL — AB (ref 6.1–8.1)

## 2015-09-18 LAB — POCT CBC
GRANULOCYTE PERCENT: 74 % (ref 37–80)
HEMATOCRIT: 37.1 % — AB (ref 37.7–47.9)
Hemoglobin: 11.1 g/dL — AB (ref 12.2–16.2)
Lymph, poc: 1.9 (ref 0.6–3.4)
MCH, POC: 24.6 pg — AB (ref 27–31.2)
MCHC: 30 g/dL — AB (ref 31.8–35.4)
MCV: 82.1 fL (ref 80–97)
MID (cbc): 0.3 (ref 0–0.9)
MPV: 7 fL (ref 0–99.8)
POC Granulocyte: 6.2 (ref 2–6.9)
POC LYMPH PERCENT: 22.9 %L (ref 10–50)
POC MID %: 3.1 % (ref 0–12)
Platelet Count, POC: 316 10*3/uL (ref 142–424)
RBC: 4.52 M/uL (ref 4.04–5.48)
RDW, POC: 21.3 %
WBC: 8.4 10*3/uL (ref 4.6–10.2)

## 2015-09-18 LAB — IRON AND TIBC
%SAT: 12 % (ref 11–50)
IRON: 72 ug/dL (ref 45–160)
TIBC: 610 ug/dL — ABNORMAL HIGH (ref 250–450)
UIBC: 538 ug/dL — AB (ref 125–400)

## 2015-09-18 LAB — POC MICROSCOPIC URINALYSIS (UMFC)

## 2015-09-18 MED ORDER — PREDNISONE 20 MG PO TABS: ORAL_TABLET | ORAL | Status: DC

## 2015-09-18 MED ORDER — TRAMADOL HCL 50 MG PO TABS: 50.0000 mg | ORAL_TABLET | Freq: Three times a day (TID) | ORAL | Status: DC | PRN

## 2015-09-18 NOTE — Patient Instructions (Addendum)
Take over-the-counter iron 1 pill twice daily  Referral is being made to a gastroenterologist for evaluation of your colon. You need a colonoscopy. Someone will contact you in the next few days to let you know what doctor that is scheduled with.  Take prednisone 3 pills daily for 3 days, then 2 daily for 3 days, then 1 daily for 3 days, then one half daily. Sometimes it makes your nerves worse while you're on this, and if that is a major problem come back in or see your psychiatrist. Discussed with your daughter before taking any of the prednisone.  If you cannot tolerate the prednisone, or if your back and pain down the leg is not improving, you'll need to go back and see Dr. Leda Gauze.  Take Tylenol 500 mg maximum of 2 pills 3 times daily for pain.  Avoid Advil (ibuprofen) or Aleve (naproxen). These tend to make your anemia worse.  Take tramadol if needed for severe pain. You can take it in combination with Tylenol for better effect. It can interact with citalopram, so if taking the tramadol more than twice daily I recommend decreasing the citalopram to just one half pill until you are off the tramadol.   If the gastroenterologist does not find any problems, we will need to make a referral to an OB/GYN also for further evaluation.

## 2015-09-18 NOTE — Progress Notes (Signed)
Patient ID: Melissa Bridges, female    DOB: 03/19/1961  Age: 54 y.o. MRN: [id number]  Chief Complaint  Patient presents with  . Back Pain    Lower  . Sciatica    Right side Onset 3 weeks off and on getting more constant  . Anemia    States she needs another RX for iron    Subjective:   Patient is here complaining of low back pain. She has had problems with this in the past. She has been evaluated in the past with an MRI and has been found to have lumbar disc disease to mild or moderate degree. She gets radicular pain down to her right knee. It is been bad at times, better another times. She has been treated several times here in the past.  She has a long history of deficiency anemia. She has a history of pica and eating cornstarch and ice. She has been treated with iron but never has had a GI workup. She did have a history of right lower quadrant abdominal pain for which she saw another physician not long ago. She had a CT scan of her abdomen which did not show any obvious lesions. However she has never had a colonoscopy.  She has a history of a schizoaffective disorder, is very anxious.  She has a long history of smoking says she is down to 1 pack every 3 days.  Current allergies, medications, problem list, past/family and social histories reviewed.  Objective:  BP 120/80 mmHg  Pulse 88  Temp(Src) 98 F (36.7 C) (Oral)  Resp 18  Ht 5\' 3"  (1.6 m)  Wt 118 lb (53.524 kg)  BMI 20.91 kg/m2  SpO2 95%  Very fidgety lady, up and down and wiggling all the time. Her chest is clear. Heart regular without murmurs. Small stronger certain smoke. Abdomen has normal bowel sounds, soft without masses but is a little tender in the right lower quadrant. That's where she is persisted in hurting apparently. No CVA tenderness. Her pain goes from her back down her right leg. Straight leg raising test positive on the right at about 45. Negative on the left. Deep tendon reflexes 1+ on the left, absent on the  right knee. Her ankle jerks are 1+ symmetrical.  Assessment & Plan:   Assessment: 1. Right-sided low back pain with right-sided sciatica   2. Anemia, iron deficiency   3. Abdominal pain, chronic, right lower quadrant   4. 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 Lumbar disc herniation   7. Pica in adults       Plan: Needs treatment for her back. If she gets to where she doesn't respond to steroids packs she may have to see a neurosurgeon.  Urged her to quit smoking since tobacco use increases back pain problems  Needs a colonoscopy to evaluate her anemia and right lower quadrant pain.  Orders Placed This Encounter  Procedures  . Comprehensive metabolic panel  . Iron and TIBC  . Ambulatory referral to Gastroenterology    Referral Priority:  Routine    Referral Type:  Consultation    Referral Reason:  Specialty Services Required    Number of Visits Requested:  1  . POCT CBC  . POCT urinalysis dipstick  . POCT Microscopic Urinalysis ([facility])   Results for orders placed or performed in visit on 09/18/15  POCT CBC  Result Value Ref Range   WBC 8.4 4.6 - 10.2 K/uL   Lymph, poc 1.9 0.6 -  3.4   POC LYMPH PERCENT 22.9 10 - 50 %L   MID (cbc) 0.3 0 - 0.9   POC MID % 3.1 0 - 12 %M   POC Granulocyte 6.2 2 - 6.9   Granulocyte percent 74.0 37 - 80 %G   RBC 4.52 4.04 - 5.48 M/uL   Hemoglobin 11.1 (A) 12.2 - 16.2 g/dL   HCT, POC 37.1 (A) 37.7 - 47.9 %   MCV 82.1 80 - 97 fL   MCH, POC 24.6 (A) 27 - 31.2 pg   MCHC 30.0 (A) 31.8 - 35.4 g/dL   RDW, POC 21.3 %   Platelet Count, POC 316 142 - 424 K/uL   MPV 7.0 0 - 99.8 fL  POCT urinalysis dipstick  Result Value Ref Range   Color, UA yellow yellow   Clarity, UA clear clear   Glucose, UA negative negative   Bilirubin, UA small (A) negative   Ketones, POC UA small (15) (A) negative   Spec Grav, UA 1.025    Blood, UA small (A) negative   pH, UA 7.0    Protein Ur, POC >=300 (A) negative   Urobilinogen, UA 1.0     Nitrite, UA Negative Negative   Leukocytes, UA Negative Negative  POCT Microscopic Urinalysis ([facility])  Result Value Ref Range   WBC,UR,HPF,POC Few (A) None WBC/hpf   RBC,UR,HPF,POC Few (A) None RBC/hpf   Bacteria Few (A) None   Mucus Present (A) Absent   Epithelial Cells, UR Per Microscopy Moderate (A) None cells/hpf         Patient Instructions  Take over-the-counter iron 1 pill twice daily  Referral is being made to a gastroenterologist for evaluation of your colon. You need a colonoscopy. Someone will contact you in the next few days to let you know what doctor that is scheduled with.  Take prednisone 3 pills daily for 3 days, then 2 daily for 3 days, then 1 daily for 3 days, then one half daily. Sometimes it makes your nerves worse while you're on this, and if that is a major problem come back in or see your psychiatrist. Discussed with your daughter before taking any of the prednisone.  If you cannot tolerate the prednisone, or if your back and pain down the leg is not improving, you'll need to go back and see Dr. Leda Gauze.  Take Tylenol 500 mg maximum of 2 pills 3 times daily for pain.  Avoid Advil (ibuprofen) or Aleve (naproxen). These tend to make your anemia worse.  Take tramadol if needed for severe pain. You can take it in combination with Tylenol for better effect. It can interact with citalopram, so if taking the tramadol more than twice daily I recommend decreasing the citalopram to just one half pill until you are off the tramadol.   If the gastroenterologist does not find any problems, we will need to make a referral to an OB/GYN also for further evaluation.     Return if symptoms worsen or fail to improve.   HOPPER,DAVID, MD 09/18/2015

## 2015-09-25 ENCOUNTER — Encounter: Payer: Self-pay | Admitting: Gastroenterology

## 2015-09-26 ENCOUNTER — Telehealth: Payer: Self-pay | Admitting: Internal Medicine

## 2015-09-26 NOTE — Telephone Encounter (Signed)
Patient called wanting to reschedule her u/s appointment of the liver that she cancelled twice already. I gave her the number for radiology so she can make the appointment, then she will call here to notify us so we can get it approved through her insurance. Patient is aware to do this

## 2015-09-29 ENCOUNTER — Encounter: Payer: Self-pay | Admitting: Family Medicine

## 2015-09-29 ENCOUNTER — Ambulatory Visit (INDEPENDENT_AMBULATORY_CARE_PROVIDER_SITE_OTHER): Payer: Medicare Other | Admitting: Family Medicine

## 2015-09-29 ENCOUNTER — Other Ambulatory Visit: Payer: Self-pay

## 2015-09-29 VITALS — BP 132/90 | HR 85 | Temp 98.5°F | Resp 16 | Ht 64.5 in | Wt 119.4 lb

## 2015-09-29 DIAGNOSIS — Z1211 Encounter for screening for malignant neoplasm of colon: Secondary | ICD-10-CM | POA: Diagnosis not present

## 2015-09-29 DIAGNOSIS — Z720 Tobacco use: Secondary | ICD-10-CM | POA: Diagnosis not present

## 2015-09-29 DIAGNOSIS — I10 Essential (primary) hypertension: Secondary | ICD-10-CM

## 2015-09-29 DIAGNOSIS — F1010 Alcohol abuse, uncomplicated: Secondary | ICD-10-CM

## 2015-09-29 DIAGNOSIS — F17200 Nicotine dependence, unspecified, uncomplicated: Secondary | ICD-10-CM

## 2015-09-29 DIAGNOSIS — N898 Other specified noninflammatory disorders of vagina: Secondary | ICD-10-CM | POA: Diagnosis not present

## 2015-09-29 DIAGNOSIS — R569 Unspecified convulsions: Secondary | ICD-10-CM | POA: Diagnosis not present

## 2015-09-29 DIAGNOSIS — Z1231 Encounter for screening mammogram for malignant neoplasm of breast: Secondary | ICD-10-CM | POA: Diagnosis not present

## 2015-09-29 DIAGNOSIS — B182 Chronic viral hepatitis C: Secondary | ICD-10-CM

## 2015-09-29 DIAGNOSIS — F1910 Other psychoactive substance abuse, uncomplicated: Secondary | ICD-10-CM | POA: Diagnosis not present

## 2015-09-29 DIAGNOSIS — Z131 Encounter for screening for diabetes mellitus: Secondary | ICD-10-CM

## 2015-09-29 DIAGNOSIS — Z13220 Encounter for screening for lipoid disorders: Secondary | ICD-10-CM | POA: Diagnosis not present

## 2015-09-29 DIAGNOSIS — F250 Schizoaffective disorder, bipolar type: Secondary | ICD-10-CM

## 2015-09-29 DIAGNOSIS — Z01419 Encounter for gynecological examination (general) (routine) without abnormal findings: Secondary | ICD-10-CM

## 2015-09-29 DIAGNOSIS — N952 Postmenopausal atrophic vaginitis: Secondary | ICD-10-CM

## 2015-09-29 DIAGNOSIS — J441 Chronic obstructive pulmonary disease with (acute) exacerbation: Secondary | ICD-10-CM | POA: Diagnosis not present

## 2015-09-29 DIAGNOSIS — M5126 Other intervertebral disc displacement, lumbar region: Secondary | ICD-10-CM

## 2015-09-29 DIAGNOSIS — Z0000 Encounter for general adult medical examination without abnormal findings: Secondary | ICD-10-CM | POA: Diagnosis not present

## 2015-09-29 DIAGNOSIS — J4530 Mild persistent asthma, uncomplicated: Secondary | ICD-10-CM

## 2015-09-29 DIAGNOSIS — Z23 Encounter for immunization: Secondary | ICD-10-CM

## 2015-09-29 LAB — POCT URINALYSIS DIP (MANUAL ENTRY)
BILIRUBIN UA: NEGATIVE
Bilirubin, UA: NEGATIVE
Glucose, UA: NEGATIVE
Nitrite, UA: NEGATIVE
PH UA: 5.5
PROTEIN UA: NEGATIVE
SPEC GRAV UA: 1.015
UROBILINOGEN UA: 0.2

## 2015-09-29 LAB — COMPREHENSIVE METABOLIC PANEL
ALBUMIN: 4.5 g/dL (ref 3.6–5.1)
ALK PHOS: 91 U/L (ref 33–130)
ALT: 84 U/L — AB (ref 6–29)
AST: 171 U/L — ABNORMAL HIGH (ref 10–35)
BUN: 9 mg/dL (ref 7–25)
CHLORIDE: 101 mmol/L (ref 98–110)
CO2: 25 mmol/L (ref 20–31)
CREATININE: 0.78 mg/dL (ref 0.50–1.05)
Calcium: 9.7 mg/dL (ref 8.6–10.4)
Glucose, Bld: 75 mg/dL (ref 65–99)
POTASSIUM: 3.7 mmol/L (ref 3.5–5.3)
SODIUM: 138 mmol/L (ref 135–146)
TOTAL PROTEIN: 8.6 g/dL — AB (ref 6.1–8.1)
Total Bilirubin: 0.3 mg/dL (ref 0.2–1.2)

## 2015-09-29 LAB — CBC WITH DIFFERENTIAL/PLATELET
BASOS ABS: 0.1 10*3/uL (ref 0.0–0.1)
Basophils Relative: 1 % (ref 0–1)
EOS ABS: 0.1 10*3/uL (ref 0.0–0.7)
Eosinophils Relative: 1 % (ref 0–5)
HCT: 37.4 % (ref 36.0–46.0)
HEMOGLOBIN: 11.6 g/dL — AB (ref 12.0–15.0)
LYMPHS ABS: 2.9 10*3/uL (ref 0.7–4.0)
Lymphocytes Relative: 34 % (ref 12–46)
MCH: 25.7 pg — AB (ref 26.0–34.0)
MCHC: 31 g/dL (ref 30.0–36.0)
MCV: 82.7 fL (ref 78.0–100.0)
MONOS PCT: 7 % (ref 3–12)
MPV: 10.4 fL (ref 8.6–12.4)
Monocytes Absolute: 0.6 10*3/uL (ref 0.1–1.0)
NEUTROS ABS: 4.8 10*3/uL (ref 1.7–7.7)
NEUTROS PCT: 57 % (ref 43–77)
Platelets: 310 10*3/uL (ref 150–400)
RBC: 4.52 MIL/uL (ref 3.87–5.11)
RDW: 18.4 % — ABNORMAL HIGH (ref 11.5–15.5)
WBC: 8.4 10*3/uL (ref 4.0–10.5)

## 2015-09-29 LAB — POCT WET + KOH PREP: Trich by wet prep: ABSENT

## 2015-09-29 MED ORDER — FERROUS SULFATE 325 (65 FE) MG PO TABS: 325.0000 mg | ORAL_TABLET | Freq: Every day | ORAL | Status: DC

## 2015-09-29 MED ORDER — AMLODIPINE BESYLATE 5 MG PO TABS: 5.0000 mg | ORAL_TABLET | Freq: Every day | ORAL | Status: DC

## 2015-09-29 MED ORDER — ESTROGENS, CONJUGATED 0.625 MG/GM VA CREA: 1.0000 | TOPICAL_CREAM | Freq: Every day | VAGINAL | Status: DC

## 2015-09-29 MED ORDER — ALBUTEROL SULFATE HFA 108 (90 BASE) MCG/ACT IN AERS: 2.0000 | INHALATION_SPRAY | Freq: Four times a day (QID) | RESPIRATORY_TRACT | Status: DC | PRN

## 2015-09-29 MED ORDER — BUDESONIDE-FORMOTEROL FUMARATE 80-4.5 MCG/ACT IN AERO: 2.0000 | INHALATION_SPRAY | Freq: Every day | RESPIRATORY_TRACT | Status: DC | PRN

## 2015-09-29 NOTE — Patient Instructions (Signed)
Keeping You Healthy  Get These Tests  Blood Pressure- Have your blood pressure checked by your healthcare provider at least once a year.  Normal blood pressure is 120/80.  Weight- Have your body mass index (BMI) calculated to screen for obesity.  BMI is a measure of body fat based on height and weight.  You can calculate your own BMI at www.nhlbisupport.com/bmi/  Cholesterol- Have your cholesterol checked every year.  Diabetes- Have your blood sugar checked every year if you have high blood pressure, high cholesterol, a family history of diabetes or if you are overweight.  Pap Test - Have a pap test every 1 to 5 years if you have been sexually active.  If you are older than 56 and recent pap tests have been normal you may not need additional pap tests.  In addition, if you have had a hysterectomy  for benign disease additional pap tests are not necessary.  Mammogram-Yearly mammograms are essential for early detection of breast cancer  Screening for Colon Cancer- Colonoscopy starting at age 56. Screening may begin sooner depending on your family history and other health conditions.  Follow up colonoscopy as directed by your Gastroenterologist.  Screening for Osteoporosis- Screening begins at age 56 with bone density scanning, sooner if you are at higher risk for developing Osteoporosis.  Get these medicines  Calcium with Vitamin D- Your body requires 1200-1500 mg of Calcium a day and 800-1000 IU of Vitamin D a day.  You can only absorb 500 mg of Calcium at a time therefore Calcium must be taken in 2 or 3 separate doses throughout the day.  Hormones- Hormone therapy has been associated with increased risk for certain cancers and heart disease.  Talk to your healthcare provider about if you need relief from menopausal symptoms.  Aspirin- Ask your healthcare provider about taking Aspirin to prevent Heart Disease and Stroke.  Get these Immuniztions  Flu shot- Every fall  Pneumonia shot-  Once after the age of 56; if you are younger ask your healthcare provider if you need a pneumonia shot.  Tetanus- Every ten years.  Zostavax- Once after the age of 56 to prevent shingles.  Take these steps  Don't smoke- Your healthcare provider can help you quit. For tips on how to quit, ask your healthcare provider or go to www.smokefree.gov or call 1-800 QUIT-NOW.  Be physically active- Exercise 5 days a week for a minimum of 30 minutes.  If you are not already physically active, start slow and gradually work up to 30 minutes of moderate physical activity.  Try walking, dancing, bike riding, swimming, etc.  Eat a healthy diet- Eat a variety of healthy foods such as fruits, vegetables, whole grains, low fat milk, low fat cheeses, yogurt, lean meats, chicken, fish, eggs, dried beans, tofu, etc.  For more information go to www.thenutritionsource.org  Dental visit- Brush and floss teeth twice daily; visit your dentist twice a year.  Eye exam- Visit your Optometrist or Ophthalmologist yearly.  Drink alcohol in moderation- Limit alcohol intake to one drink or less a day.  Never drink and drive.  Depression- Your emotional health is as important as your physical health.  If you're feeling down or losing interest in things you normally enjoy, please talk to your healthcare provider.  Seat Belts- can save your life; always wear one  Smoke/Carbon Monoxide detectors- These detectors need to be installed on the appropriate level of your home.  Replace batteries at least once a year.  Violence- If   anyone is threatening or hurting you, please tell your healthcare provider.  Living Will/ Health care power of attorney- Discuss with your healthcare provider and family. 

## 2015-09-29 NOTE — Progress Notes (Signed)
Subjective:    Patient ID: Melissa Bridges, female    DOB: 30-Mar-1961, 54 y.o.   MRN: [id number]  09/29/2015  Annual Exam and depression screen   HPI This 54 y.o. female presents for Annual Wellness Examination.  Last physical:  2014 Pap smear:  2014 Mammogram:  05-30-2014 Colonoscopy: never; scheduled 11/24/15.   Bone density: never TDAP: not in system Pneumovax:  2012 Zostavax: never Influenza:  Never documented.  Agreeable. Eye exam:  Last year.  Lost eye glasses. Dental exam: s/p surgery by oral surgery.    Seizures: scheduled 10/15/15 to see neurology for first time since moving to [place]; daughter now available to drive patient to all appointments.  Hepatitis C: appointment October 21, 2015 with ID.  DDD lumbar: s/p PT; s/p MRI; s/p Prednisone which caused mental worsening with mania followed by depression. Daughter will not allow pt to take prednisone again.   Colon cancer screening: appointment this month for colonoscopy.  HTN: Patient reports good compliance with medication, good tolerance to medication, and good symptom control.    Asthma: Patient reports good compliance with medication, good tolerance to medication, and good symptom control.  Intermittent SOB.   Schizoaffective disorder: has not established with psychiatry yet is now agreeable; several referrals have been placed in the past.   Review of Systems  Constitutional: Negative for fever, chills, diaphoresis, activity change, appetite change, fatigue and unexpected weight change.  HENT: Negative for congestion, dental problem, drooling, ear discharge, ear pain, facial swelling, hearing loss, mouth sores, nosebleeds, postnasal drip, rhinorrhea, sinus pressure, sneezing, sore throat, tinnitus, trouble swallowing and voice change.   Eyes: Negative for photophobia, pain, discharge, redness, itching and visual disturbance.  Respiratory: Negative for apnea, cough, choking, chest tightness, shortness of breath,  wheezing and stridor.   Cardiovascular: Negative for chest pain, palpitations and leg swelling.  Gastrointestinal: Negative for nausea, vomiting, abdominal pain, diarrhea, constipation, blood in stool, abdominal distention, anal bleeding and rectal pain.  Endocrine: Negative for cold intolerance, heat intolerance, polydipsia, polyphagia and polyuria.  Genitourinary: Positive for vaginal discharge and vaginal pain. Negative for dysuria, urgency, frequency, hematuria, flank pain, decreased urine volume, vaginal bleeding, enuresis, difficulty urinating, genital sores, menstrual problem, pelvic pain and dyspareunia.       +vaginal dryness.  Musculoskeletal: Positive for back pain. Negative for myalgias, joint swelling, arthralgias, gait problem, neck pain and neck stiffness.  Skin: Negative for color change, pallor, rash and wound.  Allergic/Immunologic: Negative for environmental allergies, food allergies and immunocompromised state.  Neurological: Negative for dizziness, tremors, seizures, syncope, facial asymmetry, speech difficulty, weakness, light-headedness, numbness and headaches.  Hematological: Negative for adenopathy. Does not bruise/bleed easily.  Psychiatric/Behavioral: Negative for suicidal ideas, hallucinations, behavioral problems, confusion, sleep disturbance, self-injury, dysphoric mood, decreased concentration and agitation. The patient is not nervous/anxious and is not hyperactive.     Past Medical History  Diagnosis Date  . Sciatic pain   . Schizoaffective disorder   . Hepatitis C   . Arthritis   . Asthma   . Migraine   . Allergy   . Depression   . Anxiety   . Alopecia   . Substance abuse   . Hypertension     onset age 34.  [name] Shortness of breath dyspnea   . COPD (chronic obstructive pulmonary disease) (HCC)   . Schizoaffective disorder (HCC)   . GERD (gastroesophageal reflux disease)     o cc- takes OTC if needed.  . Seizures (HCC)     onset in childhood.  Generalized tonic-clonic.Last seizure couple of months ago   Past Surgical History  Procedure Laterality Date  . Dilation and curettage of uterus      x2  . Exploratory laparotomy      x3  . Ovarian cyst removal  2008  . Abdominal hysterectomy      ovarian cyst B, cervical dysplasia, fibroids.   Ovaries intact.  . Gynecologic cryosurgery      x3  . Mandible reconstruction N/A 06/27/2015    Procedure: REMOVAL MAXILLARY PALATAL TORUS.  REMOVAL MANDIBULAR TORUS AND EXOSTOSIS.  ;  Surgeon: Ocie Doyne, DDS;  Location: [facility] OR;  Service: Oral Surgery;  Laterality: N/A;   Allergies  Allergen Reactions  . Fruit & Vegetable Daily [Nutritional Supplements] Shortness Of Breath    Aloe  . Ibuprofen Other (See Comments)    Stomach upset; However, pt can take Meloxicam without incident and takes this at home.   . Aspirin Rash and Other (See Comments)    Stomach upset   Current Outpatient Prescriptions  Medication Sig Dispense Refill  . albuterol (PROVENTIL HFA;VENTOLIN HFA) 108 (90 BASE) MCG/ACT inhaler Inhale 2 puffs into the lungs every 6 (six) hours as needed for shortness of breath. 1 Inhaler 3  . budesonide-formoterol (SYMBICORT) 80-4.5 MCG/ACT inhaler Inhale 2 puffs into the lungs daily as needed (for shortness of breath). 1 Inhaler 12  . citalopram (CELEXA) 10 MG tablet Take 1 tablet (10 mg total) by mouth daily. For depression 30 tablet 2  . divalproex (DEPAKOTE) 500 MG DR tablet Take 1 tablet (500 mg total) by mouth 3 (three) times daily. 90 tablet 3  . ipratropium-albuterol (DUONEB) 0.5-2.5 (3) MG/3ML SOLN Take 3 mLs by nebulization every 4 (four) hours as needed (shortness of breath).     . methocarbamol (ROBAXIN) 750 MG tablet Take 1 tablet (750 mg total) by mouth every 8 (eight) hours as needed for muscle spasms. 30 tablet 0  . QUEtiapine (SEROQUEL) 300 MG tablet Take 1 tablet (300 mg total) by mouth at bedtime. Mood control 30 tablet 2  . amLODipine (NORVASC) 5 MG tablet TAKE 1  TABLET (5 MG TOTAL) BY MOUTH DAILY. HIGH BLOOD PRESSURE 30 tablet 2  . conjugated estrogens (PREMARIN) vaginal cream Place 1 Applicatorful vaginally daily. 30 g 12  . ferrous sulfate 325 (65 FE) MG tablet Take 1 tablet (325 mg total) by mouth daily with breakfast. 90 tablet 1  . naproxen (NAPROSYN) 500 MG tablet TAKE 1 TABLET (500 MG TOTAL) BY MOUTH 2 (TWO) TIMES DAILY WITH A MEAL. 180 tablet 1  . oxyCODONE-acetaminophen (PERCOCET/ROXICET) 5-325 MG tablet Take 1 tablet by mouth every 6 (six) hours as needed for severe pain. 30 tablet 0   No current facility-administered medications for this visit.   Social History   Social History  . Marital Status: Widowed    Spouse Name: N/A  . Number of Children: 1  . Years of Education: N/A   Occupational History  . disabled     mental illness; seizures   Social History Main Topics  . Smoking status: Current Every Day Smoker -- 0.33 packs/day for 44 years    Types: Cigarettes  . Smokeless tobacco: Never Used  . Alcohol Use: 8.4 oz/week    14 Cans of beer per week     Comment: daily alcohol  . Drug Use: No     Comment: 06/26/15- dfenies  . Sexual Activity: Yes    Birth Control/ Protection: None     Comment: widow  Other Topics Concern  . Not on file   Social History Narrative   Marital status:  Widowed since 2002.  Married x 16 years. + dating.  Moved from [place] to live with daughter in 2013.     Children:  One child/daughter (33); two grandchildren.      Lives: with daughter, grandchildren 2.  Does not drive due to epilepsy.      Employment:  Disability for schizoaffective disorder.      Tobacco: 1 ppd x since 8th grade.      Alcohol:  Social; rare drinking due to seizure medications.  Weekends.       Drugs: none; previous use of marijuana.  Previous iv drug use, cocaine.      Exercise: none      Seatbelt:  100%      Guns: none   Family History  Problem Relation Age of Onset  . Diabetes type II Mother   . Hypertension  Mother   . Arthritis Mother   . Diabetes Mother   . Mental illness Mother     bipolar  . Diabetes type II Maternal Aunt   . Cancer Maternal Uncle   . Hypertension Father   . Heart murmur Father   . Arthritis Father   . Stroke Father   . Alopecia Sister   . Alopecia Brother   . Alopecia Sister   . Mental retardation Sister     depression       Objective:    BP 132/90 mmHg  Pulse 85  Temp(Src) 98.5 F (36.9 C) (Oral)  Resp 16  Ht 5' 4.5" (1.638 m)  Wt 119 lb 6.4 oz (54.159 kg)  BMI 20.19 kg/m2 Physical Exam  Constitutional: She is oriented to person, place, and time. She appears well-developed and well-nourished. No distress.  HENT:  Head: Normocephalic and atraumatic.  Right Ear: External ear normal.  Left Ear: External ear normal.  Nose: Nose normal.  Mouth/Throat: Oropharynx is clear and moist.  Eyes: Conjunctivae and EOM are normal. Pupils are equal, round, and reactive to light.  Neck: Normal range of motion and full passive range of motion without pain. Neck supple. No JVD present. Carotid bruit is not present. No thyromegaly present.  Cardiovascular: Normal rate, regular rhythm and normal heart sounds.  Exam reveals no gallop and no friction rub.   No murmur heard. Pulmonary/Chest: Effort normal and breath sounds normal. She has no wheezes. She has no rales. Right breast exhibits no inverted nipple, no mass, no nipple discharge, no skin change and no tenderness. Left breast exhibits no inverted nipple, no mass, no nipple discharge, no skin change and no tenderness. Breasts are symmetrical.  Abdominal: Soft. Bowel sounds are normal. She exhibits no distension and no mass. There is no tenderness. There is no rebound and no guarding.  Genitourinary: There is no rash, tenderness or lesion on the right labia. There is no rash, tenderness or lesion on the left labia. Right adnexum displays no mass, no tenderness and no fullness. Left adnexum displays no mass, no tenderness  and no fullness. Vaginal discharge found.  Musculoskeletal:       Right shoulder: Normal.       Left shoulder: Normal.       Cervical back: Normal.  Lymphadenopathy:    She has no cervical adenopathy.  Neurological: She is alert and oriented to person, place, and time. She has normal reflexes. No cranial nerve deficit. She exhibits normal muscle tone. Coordination normal.  Skin:  Skin is warm and dry. No rash noted. She is not diaphoretic. No erythema. No pallor.  Psychiatric: She has a normal mood and affect. Her behavior is normal. Judgment and thought content normal.  Nursing note and vitals reviewed.       Assessment & Plan:   1. Encounter for Medicare annual wellness exam   2. Substance abuse   3. Smoker   4. Seizures (HCC)   5. Screening for diabetes mellitus   6. Schizoaffective disorder, bipolar type (HCC)   7. Lumbar disc herniation   8. Essential hypertension   9. Chronic hepatitis C without hepatic coma (HCC)   10. Asthma with acute exacerbation, mild persistent   11. Alcohol abuse   12. Need for prophylactic vaccination and inoculation against influenza   13. Encounter for screening mammogram for breast cancer   14. Colon cancer screening   15. Asthma, mild persistent, uncomplicated   16. COPD exacerbation (HCC)   17. Atrophic vaginitis   18. Screening, lipid   19. Vaginal discharge   20. Encounter for routine gynecological examination     1. Annual wellness exam: anticipatory guidance --- exercise, smoking cessation, 3 servings of calcium/dairy daily. Pap smear UTD; refer for mammogram.  Refer for colonoscopy.  S/p flu vaccine in office.  Moderate fall risk due to seizures, mental illness, chronic back pain.  Does not drive.  Independent with ADLs.  No advanced directives.  No hearing loss.   2.  Gynecological exam: pap smear UTD; refer for mammogram. 3.  Seizures: stable; appointment next month with neurology; obtain labs. 4. Screening DMII: obtain glucose. 5.   Schizoaffective disorder: stable; refer again to psychiatry now that daughter available to transport patient to appointments. 6.  Lumbar disc herniation: persistent; managed by [facility].  S/p MRI lumbar spine; s/p PT.  Cannot tolerate Prednisone. 7.  Chronic hepatitis C: stable; appointment in upcoming month with ID. 8.  Asthma: stable; refills provided.  Encourage smoking cessation. 9.  Atrophic vaginitis: New; rx for estrogen cream provided; will not prescribed HRT due to smoking, HTN. 10.  Candidiasis vulvovaginal: New. rx for Terazol provided.   Orders Placed This Encounter  Procedures  . MM Digital Screening    Standing Status: Future     Number of Occurrences:      Standing Expiration Date: 11/28/2016    Order Specific Question:  Reason for Exam (SYMPTOM  OR DIAGNOSIS REQUIRED)    Answer:  Fenton Malling EXAM    Order Specific Question:  Is the patient pregnant?    Answer:  No    Order Specific Question:  Preferred imaging location?    Answer:  [facility]  . Flu Vaccine QUAD 36+ mos IM  . CBC with Differential/Platelet  . Comprehensive metabolic panel    Order Specific Question:  Has the patient fasted?    Answer:  Yes  . Ambulatory referral to Gastroenterology    Referral Priority:  Routine    Referral Type:  Consultation    Referral Reason:  Specialty Services Required    Number of Visits Requested:  1  . Ambulatory referral to Psychiatry    Referral Priority:  Routine    Referral Type:  Psychiatric    Referral Reason:  Specialty Services Required    Requested Specialty:  Psychiatry    Number of Visits Requested:  1  . POCT urinalysis dipstick  . POCT Wet + KOH Prep ([facility])   Meds ordered this encounter  Medications  . DISCONTD: amLODipine (NORVASC)  5 MG tablet    Sig: Take 1 tablet (5 mg total) by mouth daily. High blood pressure    Dispense:  30 tablet    Refill:  5  . albuterol (PROVENTIL HFA;VENTOLIN HFA) 108 (90 BASE) MCG/ACT inhaler    Sig: Inhale 2  puffs into the lungs every 6 (six) hours as needed for shortness of breath.    Dispense:  1 Inhaler    Refill:  3  . budesonide-formoterol (SYMBICORT) 80-4.5 MCG/ACT inhaler    Sig: Inhale 2 puffs into the lungs daily as needed (for shortness of breath).    Dispense:  1 Inhaler    Refill:  12  . ferrous sulfate 325 (65 FE) MG tablet    Sig: Take 1 tablet (325 mg total) by mouth daily with breakfast.    Dispense:  90 tablet    Refill:  1  . conjugated estrogens (PREMARIN) vaginal cream    Sig: Place 1 Applicatorful vaginally daily.    Dispense:  30 g    Refill:  12  . DISCONTD: terconazole (TERAZOL 7) 0.4 % vaginal cream    Sig: Place 1 applicator vaginally at bedtime.    Dispense:  45 g    Refill:  0    Return in about 3 months (around 12/30/2015) for recheck.     Taavi Hoose Paulita Fujita, M.D. [facility] [address] [place], [place]  [address] [phone] phone [phone] fax

## 2015-10-01 LAB — PAP IG W/ RFLX HPV ASCU

## 2015-10-08 ENCOUNTER — Ambulatory Visit (INDEPENDENT_AMBULATORY_CARE_PROVIDER_SITE_OTHER): Payer: Medicare Other | Admitting: Family Medicine

## 2015-10-08 VITALS — BP 104/64 | HR 78 | Temp 98.5°F | Resp 16 | Ht 64.5 in | Wt 123.4 lb

## 2015-10-08 DIAGNOSIS — F1910 Other psychoactive substance abuse, uncomplicated: Secondary | ICD-10-CM

## 2015-10-08 DIAGNOSIS — M5431 Sciatica, right side: Secondary | ICD-10-CM | POA: Diagnosis not present

## 2015-10-08 MED ORDER — NAPROXEN 500 MG PO TABS: 500.0000 mg | ORAL_TABLET | Freq: Two times a day (BID) | ORAL | Status: DC

## 2015-10-08 MED ORDER — TERCONAZOLE 0.4 % VA CREA: 1.0000 | TOPICAL_CREAM | Freq: Every day | VAGINAL | Status: DC

## 2015-10-08 MED ORDER — OXYCODONE-ACETAMINOPHEN 5-325 MG PO TABS: 1.0000 | ORAL_TABLET | Freq: Four times a day (QID) | ORAL | Status: DC | PRN

## 2015-10-08 NOTE — Patient Instructions (Signed)
Sciatica With Rehab The sciatic nerve runs from the back down the leg and is responsible for sensation and control of the muscles in the back (posterior) side of the thigh, lower leg, and foot. Sciatica is a condition that is characterized by inflammation of this nerve.  SYMPTOMS   Signs of nerve damage, including numbness and/or weakness along the posterior side of the lower extremity.  Pain in the back of the thigh that may also travel down the leg.  Pain that worsens when sitting for long periods of time.  Occasionally, pain in the back or buttock. CAUSES  Inflammation of the sciatic nerve is the cause of sciatica. The inflammation is due to something irritating the nerve. Common sources of irritation include:  Sitting for long periods of time.  Direct trauma to the nerve.  Arthritis of the spine.  Herniated or ruptured disk.  Slipping of the vertebrae (spondylolisthesis).  Pressure from soft tissues, such as muscles or ligament-like tissue (fascia). RISK INCREASES WITH:  Sports that place pressure or stress on the spine (football or weightlifting).  Poor strength and flexibility.  Failure to warm up properly before activity.  Family history of low back pain or disk disorders.  Previous back injury or surgery.  Poor body mechanics, especially when lifting, or poor posture. PREVENTION   Warm up and stretch properly before activity.  Maintain physical fitness:  Strength, flexibility, and endurance.  Cardiovascular fitness.  Learn and use proper technique, especially with posture and lifting. When possible, have coach correct improper technique.  Avoid activities that place stress on the spine. PROGNOSIS If treated properly, then sciatica usually resolves within 6 weeks. However, occasionally surgery is necessary.  RELATED COMPLICATIONS   Permanent nerve damage, including pain, numbness, tingle, or weakness.  Chronic back pain.  Risks of surgery: infection,  bleeding, nerve damage, or damage to surrounding tissues. TREATMENT Treatment initially involves resting from any activities that aggravate your symptoms. The use of ice and medication may help reduce pain and inflammation. The use of strengthening and stretching exercises may help reduce pain with activity. These exercises may be performed at home or with referral to a therapist. A therapist may recommend further treatments, such as transcutaneous electronic nerve stimulation (TENS) or ultrasound. Your caregiver may recommend corticosteroid injections to help reduce inflammation of the sciatic nerve. If symptoms persist despite non-surgical (conservative) treatment, then surgery may be recommended. MEDICATION  If pain medication is necessary, then nonsteroidal anti-inflammatory medications, such as aspirin and ibuprofen, or other minor pain relievers, such as acetaminophen, are often recommended.  Do not take pain medication for 7 days before surgery.  Prescription pain relievers may be given if deemed necessary by your caregiver. Use only as directed and only as much as you need.  Ointments applied to the skin may be helpful.  Corticosteroid injections may be given by your caregiver. These injections should be reserved for the most serious cases, because they may only be given a certain number of times. HEAT AND COLD  Cold treatment (icing) relieves pain and reduces inflammation. Cold treatment should be applied for 10 to 15 minutes every 2 to 3 hours for inflammation and pain and immediately after any activity that aggravates your symptoms. Use ice packs or massage the area with a piece of ice (ice massage).  Heat treatment may be used prior to performing the stretching and strengthening activities prescribed by your caregiver, physical therapist, or athletic trainer. Use a heat pack or soak the injury in warm water.   SEEK MEDICAL CARE IF:  Treatment seems to offer no benefit, or the condition  worsens.  Any medications produce adverse side effects. EXERCISES  RANGE OF MOTION (ROM) AND STRETCHING EXERCISES - Sciatica Most people with sciatic will find that their symptoms worsen with either excessive bending forward (flexion) or arching at the low back (extension). The exercises which will help resolve your symptoms will focus on the opposite motion. Your physician, physical therapist or athletic trainer will help you determine which exercises will be most helpful to resolve your low back pain. Do not complete any exercises without first consulting with your clinician. Discontinue any exercises which worsen your symptoms until you speak to your clinician. If you have pain, numbness or tingling which travels down into your buttocks, leg or foot, the goal of the therapy is for these symptoms to move closer to your back and eventually resolve. Occasionally, these leg symptoms will get better, but your low back pain may worsen; this is typically an indication of progress in your rehabilitation. Be certain to be very alert to any changes in your symptoms and the activities in which you participated in the 24 hours prior to the change. Sharing this information with your clinician will allow him/her to most efficiently treat your condition. These exercises may help you when beginning to rehabilitate your injury. Your symptoms may resolve with or without further involvement from your physician, physical therapist or athletic trainer. While completing these exercises, remember:   Restoring tissue flexibility helps normal motion to return to the joints. This allows healthier, less painful movement and activity.  An effective stretch should be held for at least 30 seconds.  A stretch should never be painful. You should only feel a gentle lengthening or release in the stretched tissue. FLEXION RANGE OF MOTION AND STRETCHING EXERCISES: STRETCH - Flexion, Single Knee to Chest   Lie on a firm bed or floor  with both legs extended in front of you.  Keeping one leg in contact with the floor, bring your opposite knee to your chest. Hold your leg in place by either grabbing behind your thigh or at your knee.  Pull until you feel a gentle stretch in your low back. Hold __________ seconds.  Slowly release your grasp and repeat the exercise with the opposite side. Repeat __________ times. Complete this exercise __________ times per day.  STRETCH - Flexion, Double Knee to Chest  Lie on a firm bed or floor with both legs extended in front of you.  Keeping one leg in contact with the floor, bring your opposite knee to your chest.  Tense your stomach muscles to support your back and then lift your other knee to your chest. Hold your legs in place by either grabbing behind your thighs or at your knees.  Pull both knees toward your chest until you feel a gentle stretch in your low back. Hold __________ seconds.  Tense your stomach muscles and slowly return one leg at a time to the floor. Repeat __________ times. Complete this exercise __________ times per day.  STRETCH - Low Trunk Rotation   Lie on a firm bed or floor. Keeping your legs in front of you, bend your knees so they are both pointed toward the ceiling and your feet are flat on the floor.  Extend your arms out to the side. This will stabilize your upper body by keeping your shoulders in contact with the floor.  Gently and slowly drop both knees together to one side until   you feel a gentle stretch in your low back. Hold for __________ seconds.  Tense your stomach muscles to support your low back as you bring your knees back to the starting position. Repeat the exercise to the other side. Repeat __________ times. Complete this exercise __________ times per day  EXTENSION RANGE OF MOTION AND FLEXIBILITY EXERCISES: STRETCH - Extension, Prone on Elbows  Lie on your stomach on the floor, a bed will be too soft. Place your palms about shoulder  width apart and at the height of your head.  Place your elbows under your shoulders. If this is too painful, stack pillows under your chest.  Allow your body to relax so that your hips drop lower and make contact more completely with the floor.  Hold this position for __________ seconds.  Slowly return to lying flat on the floor. Repeat __________ times. Complete this exercise __________ times per day.  RANGE OF MOTION - Extension, Prone Press Ups  Lie on your stomach on the floor, a bed will be too soft. Place your palms about shoulder width apart and at the height of your head.  Keeping your back as relaxed as possible, slowly straighten your elbows while keeping your hips on the floor. You may adjust the placement of your hands to maximize your comfort. As you gain motion, your hands will come more underneath your shoulders.  Hold this position __________ seconds.  Slowly return to lying flat on the floor. Repeat __________ times. Complete this exercise __________ times per day.  STRENGTHENING EXERCISES - Sciatica  These exercises may help you when beginning to rehabilitate your injury. These exercises should be done near your "sweet spot." This is the neutral, low-back arch, somewhere between fully rounded and fully arched, that is your least painful position. When performed in this safe range of motion, these exercises can be used for people who have either a flexion or extension based injury. These exercises may resolve your symptoms with or without further involvement from your physician, physical therapist or athletic trainer. While completing these exercises, remember:   Muscles can gain both the endurance and the strength needed for everyday activities through controlled exercises.  Complete these exercises as instructed by your physician, physical therapist or athletic trainer. Progress with the resistance and repetition exercises only as your caregiver advises.  You may  experience muscle soreness or fatigue, but the pain or discomfort you are trying to eliminate should never worsen during these exercises. If this pain does worsen, stop and make certain you are following the directions exactly. If the pain is still present after adjustments, discontinue the exercise until you can discuss the trouble with your clinician. STRENGTHENING - Deep Abdominals, Pelvic Tilt   Lie on a firm bed or floor. Keeping your legs in front of you, bend your knees so they are both pointed toward the ceiling and your feet are flat on the floor.  Tense your lower abdominal muscles to press your low back into the floor. This motion will rotate your pelvis so that your tail bone is scooping upwards rather than pointing at your feet or into the floor.  With a gentle tension and even breathing, hold this position for __________ seconds. Repeat __________ times. Complete this exercise __________ times per day.  STRENGTHENING - Abdominals, Crunches   Lie on a firm bed or floor. Keeping your legs in front of you, bend your knees so they are both pointed toward the ceiling and your feet are flat on the   floor. Cross your arms over your chest.  Slightly tip your chin down without bending your neck.  Tense your abdominals and slowly lift your trunk high enough to just clear your shoulder blades. Lifting higher can put excessive stress on the low back and does not further strengthen your abdominal muscles.  Control your return to the starting position. Repeat __________ times. Complete this exercise __________ times per day.  STRENGTHENING - Quadruped, Opposite UE/LE Lift  Assume a hands and knees position on a firm surface. Keep your hands under your shoulders and your knees under your hips. You may place padding under your knees for comfort.  Find your neutral spine and gently tense your abdominal muscles so that you can maintain this position. Your shoulders and hips should form a rectangle  that is parallel with the floor and is not twisted.  Keeping your trunk steady, lift your right hand no higher than your shoulder and then your left leg no higher than your hip. Make sure you are not holding your breath. Hold this position __________ seconds.  Continuing to keep your abdominal muscles tense and your back steady, slowly return to your starting position. Repeat with the opposite arm and leg. Repeat __________ times. Complete this exercise __________ times per day.  STRENGTHENING - Abdominals and Quadriceps, Straight Leg Raise   Lie on a firm bed or floor with both legs extended in front of you.  Keeping one leg in contact with the floor, bend the other knee so that your foot can rest flat on the floor.  Find your neutral spine, and tense your abdominal muscles to maintain your spinal position throughout the exercise.  Slowly lift your straight leg off the floor about 6 inches for a count of 15, making sure to not hold your breath.  Still keeping your neutral spine, slowly lower your leg all the way to the floor. Repeat this exercise with each leg __________ times. Complete this exercise __________ times per day. POSTURE AND BODY MECHANICS CONSIDERATIONS - Sciatica Keeping correct posture when sitting, standing or completing your activities will reduce the stress put on different body tissues, allowing injured tissues a chance to heal and limiting painful experiences. The following are general guidelines for improved posture. Your physician or physical therapist will provide you with any instructions specific to your needs. While reading these guidelines, remember:  The exercises prescribed by your provider will help you have the flexibility and strength to maintain correct postures.  The correct posture provides the optimal environment for your joints to work. All of your joints have less wear and tear when properly supported by a spine with good posture. This means you will  experience a healthier, less painful body.  Correct posture must be practiced with all of your activities, especially prolonged sitting and standing. Correct posture is as important when doing repetitive low-stress activities (typing) as it is when doing a single heavy-load activity (lifting). RESTING POSITIONS Consider which positions are most painful for you when choosing a resting position. If you have pain with flexion-based activities (sitting, bending, stooping, squatting), choose a position that allows you to rest in a less flexed posture. You would want to avoid curling into a fetal position on your side. If your pain worsens with extension-based activities (prolonged standing, working overhead), avoid resting in an extended position such as sleeping on your stomach. Most people will find more comfort when they rest with their spine in a more neutral position, neither too rounded nor too   arched. Lying on a non-sagging bed on your side with a pillow between your knees, or on your back with a pillow under your knees will often provide some relief. Keep in mind, being in any one position for a prolonged period of time, no matter how correct your posture, can still lead to stiffness. PROPER SITTING POSTURE In order to minimize stress and discomfort on your spine, you must sit with correct posture Sitting with good posture should be effortless for a healthy body. Returning to good posture is a gradual process. Many people can work toward this most comfortably by using various supports until they have the flexibility and strength to maintain this posture on their own. When sitting with proper posture, your ears will fall over your shoulders and your shoulders will fall over your hips. You should use the back of the chair to support your upper back. Your low back will be in a neutral position, just slightly arched. You may place a small pillow or folded towel at the base of your low back for support.  When  working at a desk, create an environment that supports good, upright posture. Without extra support, muscles fatigue and lead to excessive strain on joints and other tissues. Keep these recommendations in mind: CHAIR:   A chair should be able to slide under your desk when your back makes contact with the back of the chair. This allows you to work closely.  The chair's height should allow your eyes to be level with the upper part of your monitor and your hands to be slightly lower than your elbows. BODY POSITION  Your feet should make contact with the floor. If this is not possible, use a foot rest.  Keep your ears over your shoulders. This will reduce stress on your neck and low back. INCORRECT SITTING POSTURES   If you are feeling tired and unable to assume a healthy sitting posture, do not slouch or slump. This puts excessive strain on your back tissues, causing more damage and pain. Healthier options include:  Using more support, like a lumbar pillow.  Switching tasks to something that requires you to be upright or walking.  Talking a brief walk.  Lying down to rest in a neutral-spine position. PROLONGED STANDING WHILE SLIGHTLY LEANING FORWARD  When completing a task that requires you to lean forward while standing in one place for a long time, place either foot up on a stationary 2-4 inch high object to help maintain the best posture. When both feet are on the ground, the low back tends to lose its slight inward curve. If this curve flattens (or becomes too large), then the back and your other joints will experience too much stress, fatigue more quickly and can cause pain.  CORRECT STANDING POSTURES Proper standing posture should be assumed with all daily activities, even if they only take a few moments, like when brushing your teeth. As in sitting, your ears should fall over your shoulders and your shoulders should fall over your hips. You should keep a slight tension in your abdominal  muscles to brace your spine. Your tailbone should point down to the ground, not behind your body, resulting in an over-extended swayback posture.  INCORRECT STANDING POSTURES  Common incorrect standing postures include a forward head, locked knees and/or an excessive swayback. WALKING Walk with an upright posture. Your ears, shoulders and hips should all line-up. PROLONGED ACTIVITY IN A FLEXED POSITION When completing a task that requires you to bend forward   at your waist or lean over a low surface, try to find a way to stabilize 3 of 4 of your limbs. You can place a hand or elbow on your thigh or rest a knee on the surface you are reaching across. This will provide you more stability so that your muscles do not fatigue as quickly. By keeping your knees relaxed, or slightly bent, you will also reduce stress across your low back. CORRECT LIFTING TECHNIQUES DO :   Assume a wide stance. This will provide you more stability and the opportunity to get as close as possible to the object which you are lifting.  Tense your abdominals to brace your spine; then bend at the knees and hips. Keeping your back locked in a neutral-spine position, lift using your leg muscles. Lift with your legs, keeping your back straight.  Test the weight of unknown objects before attempting to lift them.  Try to keep your elbows locked down at your sides in order get the best strength from your shoulders when carrying an object.  Always ask for help when lifting heavy or awkward objects. INCORRECT LIFTING TECHNIQUES DO NOT:   Lock your knees when lifting, even if it is a small object.  Bend and twist. Pivot at your feet or move your feet when needing to change directions.  Assume that you cannot safely pick up a paperclip without proper posture.   This information is not intended to replace advice given to you by your health care provider. Make sure you discuss any questions you have with your health care provider.     Document Released: 11/29/2005 Document Revised: 04/15/2015 Document Reviewed: 03/13/2009 [employer] Patient Education 2016 [employer]  

## 2015-10-08 NOTE — Progress Notes (Signed)
Subjective:    Patient ID: Melissa Bridges, female    DOB: 11/30/1961, 54 y.o.   MRN: [id number]  10/08/2015  nerve pain  This chart was scribed for Nilda Simmer, MD by Littie Deeds, Medical Scribe. This patient was seen in Room 4 and the patient's care was started at 6:20 PM.   HPI  HPI Comments: Melissa Bridges is a 54 y.o. female with a history of lumbar disc herniation and chronic hepatitis C who presents with her adult daughter to the [facility] complaining of gradual onset, constant back pain radiating down her right leg to the knee that started over a week ago, but worsened 3 days ago. The pain was intermittent initially, but has become a constant pain over the past 3 days. Patient has associated tingling in her right leg; no burning pain in her leg. She has tried Aleve and Tylenol with some relief. She has also been taking oxycodone. Patient denies saddle anesthesia and bowel or bladder incontinence. She has an appointment with her orthopedist next month. She has ongoing right-sided groin pain and will be having a colonoscopy. She takes Robaxin 3 times a day for muscle spasms in her arms, legs and hands, but she states it does not help with the spasms. Patient has allergies to ibuprofen (makes her stomach upset) and Mobic (causes nausea). She is able to tolerate hydrocodone.   Review of Systems  Genitourinary: Negative for decreased urine volume, enuresis and difficulty urinating.  Musculoskeletal: Positive for myalgias and back pain. Negative for joint swelling, arthralgias, gait problem, neck pain and neck stiffness.  Neurological: Negative for weakness and numbness.    Past Medical History  Diagnosis Date  . Sciatic pain   . Schizoaffective disorder   . Hepatitis C   . Arthritis   . Asthma   . Migraine   . Allergy   . Depression   . Anxiety   . Alopecia   . Substance abuse   . Hypertension     onset age 70.  [name] Shortness of breath dyspnea   . COPD  (chronic obstructive pulmonary disease) (HCC)   . Schizoaffective disorder (HCC)   . GERD (gastroesophageal reflux disease)     o cc- takes OTC if needed.  . Seizures (HCC)     onset in childhood.  Generalized tonic-clonic.Last seizure couple of months ago   Past Surgical History  Procedure Laterality Date  . Dilation and curettage of uterus      x2  . Exploratory laparotomy      x3  . Ovarian cyst removal  2008  . Abdominal hysterectomy      ovarian cyst B, cervical dysplasia, fibroids.   Ovaries intact.  . Gynecologic cryosurgery      x3  . Mandible reconstruction N/A 06/27/2015    Procedure: REMOVAL MAXILLARY PALATAL TORUS.  REMOVAL MANDIBULAR TORUS AND EXOSTOSIS.  ;  Surgeon: Ocie Doyne, DDS;  Location: [facility] OR;  Service: Oral Surgery;  Laterality: N/A;   Allergies  Allergen Reactions  . Fruit & Vegetable Daily [Nutritional Supplements] Shortness Of Breath    Aloe  . Ibuprofen Other (See Comments)    Stomach upset; However, pt can take Meloxicam without incident and takes this at home.   . Aspirin Rash and Other (See Comments)    Stomach upset   Current Outpatient Prescriptions  Medication Sig Dispense Refill  . albuterol (PROVENTIL HFA;VENTOLIN HFA) 108 (90 BASE) MCG/ACT inhaler Inhale 2 puffs into the lungs every 6 (  six) hours as needed for shortness of breath. 1 Inhaler 3  . budesonide-formoterol (SYMBICORT) 80-4.5 MCG/ACT inhaler Inhale 2 puffs into the lungs daily as needed (for shortness of breath). 1 Inhaler 12  . citalopram (CELEXA) 10 MG tablet Take 1 tablet (10 mg total) by mouth daily. For depression 30 tablet 2  . conjugated estrogens (PREMARIN) vaginal cream Place 1 Applicatorful vaginally daily. 30 g 12  . divalproex (DEPAKOTE) 500 MG DR tablet Take 1 tablet (500 mg total) by mouth 3 (three) times daily. 90 tablet 3  . ferrous sulfate 325 (65 FE) MG tablet Take 1 tablet (325 mg total) by mouth daily with breakfast. 90 tablet 1  . ipratropium-albuterol (DUONEB)  0.5-2.5 (3) MG/3ML SOLN Take 3 mLs by nebulization every 4 (four) hours as needed (shortness of breath).     . methocarbamol (ROBAXIN) 750 MG tablet Take 1 tablet (750 mg total) by mouth every 8 (eight) hours as needed for muscle spasms. 30 tablet 0  . QUEtiapine (SEROQUEL) 300 MG tablet Take 1 tablet (300 mg total) by mouth at bedtime. Mood control 30 tablet 2  . amLODipine (NORVASC) 5 MG tablet TAKE 1 TABLET (5 MG TOTAL) BY MOUTH DAILY. HIGH BLOOD PRESSURE 30 tablet 2  . naproxen (NAPROSYN) 500 MG tablet TAKE 1 TABLET (500 MG TOTAL) BY MOUTH 2 (TWO) TIMES DAILY WITH A MEAL. 180 tablet 1  . oxyCODONE-acetaminophen (PERCOCET/ROXICET) 5-325 MG tablet Take 1 tablet by mouth every 6 (six) hours as needed for severe pain. 30 tablet 0   No current facility-administered medications for this visit.   Social History   Social History  . Marital Status: Widowed    Spouse Name: N/A  . Number of Children: 1  . Years of Education: N/A   Occupational History  . disabled     mental illness; seizures   Social History Main Topics  . Smoking status: Current Every Day Smoker -- 0.33 packs/day for 44 years    Types: Cigarettes  . Smokeless tobacco: Never Used  . Alcohol Use: 8.4 oz/week    14 Cans of beer per week     Comment: daily alcohol  . Drug Use: No     Comment: 06/26/15- dfenies  . Sexual Activity: Yes    Birth Control/ Protection: None     Comment: widow   Other Topics Concern  . Not on file   Social History Narrative   Marital status:  Widowed since 2002.  Married x 16 years. + dating.  Moved from [place] to live with daughter in 2013.     Children:  One child/daughter (33); two grandchildren.      Lives: with daughter, grandchildren 2.  Does not drive due to epilepsy.      Employment:  Disability for schizoaffective disorder.      Tobacco: 1 ppd x since 8th grade.      Alcohol:  Social; rare drinking due to seizure medications.  Weekends.       Drugs: none; previous use of  marijuana.  Previous iv drug use, cocaine.      Exercise: none      Seatbelt:  100%      Guns: none   Family History  Problem Relation Age of Onset  . Diabetes type II Mother   . Hypertension Mother   . Arthritis Mother   . Diabetes Mother   . Mental illness Mother     bipolar  . Diabetes type II Maternal Aunt   . Cancer  Maternal Uncle   . Hypertension Father   . Heart murmur Father   . Arthritis Father   . Stroke Father   . Alopecia Sister   . Alopecia Brother   . Alopecia Sister   . Mental retardation Sister     depression       Objective:    BP 104/64 mmHg  Pulse 78  Temp(Src) 98.5 F (36.9 C) (Oral)  Resp 16  Ht 5' 4.5" (1.638 m)  Wt 123 lb 6.4 oz (55.974 kg)  BMI 20.86 kg/m2  SpO2 95% Physical Exam  Constitutional: She is oriented to person, place, and time. She appears well-developed and well-nourished. No distress.  HENT:  Head: Normocephalic and atraumatic.  Mouth/Throat: Oropharynx is clear and moist. No oropharyngeal exudate.  Eyes: Conjunctivae are normal. Pupils are equal, round, and reactive to light.  Neck: Normal range of motion. Neck supple.  Cardiovascular: Normal rate, regular rhythm and normal heart sounds.  Exam reveals no gallop and no friction rub.   No murmur heard. Pulmonary/Chest: Effort normal and breath sounds normal. She has no wheezes. She has no rales.  Musculoskeletal: She exhibits no edema.       Lumbar back: She exhibits decreased range of motion, pain and spasm. She exhibits no tenderness and no bony tenderness.  Lumbar spine:  Non-tender midline; non-tender paraspinal regions B.  Straight leg raises negative B; toe and heel walking intact; marching intact; motor 5/5 BLE.  decreased ROM lumbar spine due to pain.   Neurological: She is alert and oriented to person, place, and time. No cranial nerve deficit.  Skin: Skin is warm and dry. No rash noted. She is not diaphoretic.  Psychiatric: She has a normal mood and affect. Her  behavior is normal.  Nursing note and vitals reviewed.       Assessment & Plan:   1. Right sided sciatica   2. Sciatica, right    -Recurrent. -followed by [facility]; appointment next month; s/p MRI lumbar spine in past year. -Rx for Naproxen and Percocet provided. -Recommend home exercise program. -Use narcotics sparingly due to history of substance abuse.  No orders of the defined types were placed in this encounter.   Meds ordered this encounter  Medications  . DISCONTD: naproxen (NAPROSYN) 500 MG tablet    Sig: Take 1 tablet (500 mg total) by mouth 2 (two) times daily with a meal.    Dispense:  45 tablet    Refill:  0  . DISCONTD: oxyCODONE-acetaminophen (PERCOCET/ROXICET) 5-325 MG tablet    Sig: Take 1 tablet by mouth every 6 (six) hours as needed for severe pain.    Dispense:  30 tablet    Refill:  0    Return if symptoms worsen or fail to improve.    By signing my name below, I, Littie Deeds, attest that this documentation has been prepared under the direction and in the presence of Nilda Simmer, MD.  Electronically Signed: Littie Deeds, Medical Scribe. 11/09/2015. 5:27 PM.   I personally performed the services described in this documentation, which was scribed in my presence. The recorded information has been reviewed and considered.  Kalayah Leske Paulita Fujita, M.D. [facility] [address] [place], [place]  [address] [phone] phone [phone] fax

## 2015-10-14 ENCOUNTER — Other Ambulatory Visit: Payer: Self-pay | Admitting: Family Medicine

## 2015-10-15 ENCOUNTER — Ambulatory Visit (INDEPENDENT_AMBULATORY_CARE_PROVIDER_SITE_OTHER): Payer: Medicare Other | Admitting: Neurology

## 2015-10-15 ENCOUNTER — Encounter: Payer: Self-pay | Admitting: Neurology

## 2015-10-15 VITALS — BP 104/64 | HR 89 | Resp 14 | Ht 63.25 in | Wt 120.0 lb

## 2015-10-15 DIAGNOSIS — G40009 Localization-related (focal) (partial) idiopathic epilepsy and epileptic syndromes with seizures of localized onset, not intractable, without status epilepticus: Secondary | ICD-10-CM

## 2015-10-15 DIAGNOSIS — F259 Schizoaffective disorder, unspecified: Secondary | ICD-10-CM | POA: Diagnosis not present

## 2015-10-15 NOTE — Patient Instructions (Addendum)
1. Take Depakote 500mg : 3 tablets at night regularly 2. After taking medication regularly for 2 weeks, have bloodwork done for Depakote level 3. Schedule routine EEG 4. Schedule MRI brain with and without contrast 5. Follow-up in 1 month  6. You have been scheduled at [facility] for MRI on 11/03/15. Please arrive @ 11:30 am.       [place], [place] [address]   [phone]  Seizure Precautions: 1. If medication has been prescribed for you to prevent seizures, take it exactly as directed.  Do not stop taking the medicine without talking to your doctor first, even if you have not had a seizure in a long time.   2. Avoid activities in which a seizure would cause danger to yourself or to others.  Don't operate dangerous machinery, swim alone, or climb in high or dangerous places, such as on ladders, roofs, or girders.  Do not drive unless your doctor says you may.  3. If you have any warning that you may have a seizure, lay down in a safe place where you can't hurt yourself.    4.  No driving for 6 months from last seizure, as per [facility] [place] [facility].   Please refer to the following link on the [place] website for more information: http://[email or web address].org/[email or web address]/driving/drivingu.cfm   5.  Maintain good sleep hygiene. Avoid alcohol  6.  Contact your doctor if you have any problems that may be related to the medicine you are taking.  7.  Call 911 and bring the patient back to the ED if:        A.  The seizure lasts longer than 5 minutes.       B.  The patient doesn't awaken shortly after the seizure  C.  The patient has new problems such as difficulty seeing, speaking or moving  D.  The patient was injured during the seizure  E.  The patient has a temperature over 102 F (39C)  F.  The patient vomited and now is having trouble breathing

## 2015-10-15 NOTE — Progress Notes (Signed)
NEUROLOGY CONSULTATION NOTE  Melissa Bridges MRN: [id number] DOB: May 27, 1961  Referring provider: Dr. Reginia Forts Primary care provider: Dr. Reginia Forts  Reason for consult:  Establish care for seizures  Dear Dr Tamala Julian:  Thank you for your kind referral of Melissa Bridges for consultation of the above symptoms. Although her history is well known to you, please allow me to reiterate it for the purpose of our medical record. The patient was accompanied to the clinic by her daughter who also provides collateral information. Records and images were personally reviewed where available.  HISTORY OF PRESENT ILLNESS: This is a pleasant 54 year old right-handed woman with a history of hypertension, schizoaffective disorder, and seizures since childhood, presenting to establish care for recurrent seizures. She has "always had seizures." She would usually have a headache or hear a high pitched sound, followed by loss of hearing, then she loses consciousness and has stiffening and shaking. Sometimes she smells blood and feels sick before a seizure. In the past she could feel them coming on but recently they "just hit." She has injured herself and woken up on the ground, hit her head a few times, with tongue bite and urinary incontinence. Her daughter has never seen any of the seizures, but reports her granddaughter has witnessed the stiffening and shaking, and has seen a couple of episodes where she blanks out for a second or so. The last seizure she is aware of occurred 2 months ago, however she was told by a friend that she had 2 seizures in her sleep 2 weeks ago, she had woken up feeling sore. Her longest seizure-free interval has been at least 2-3 months. She recalls being on Depakote and Dilantin in the past when she was having seizures "back to back," then when they quieted down some, she was taken off Dilantin and has been on Depakote for many years now. Her daughter reports some compliance issues, a  Depakote level in August 2016 was low at 37.1. She is supposed to be taking Depakote DR 1500mg  qhs. In the past she was instructed to take it TID, but felt better taking them all at the same time. Her daughter reports that she does not take it regularly because it affects her mood. With her schizoaffective disorder, she reports auditory and visual hallucinations. The visual hallucinations are better, she is still hearing things but not like before. She will be seeing Psychiatry next month.  She has intermittent headaches, mostly when stressed, no associated nausea/vomiting/photo/phonophobia. She feels her vision is blurred. She denies any dysarthria/dysphagia, neck pain, focal numbness/tingling/weakness. She has right-sided low back pain and sciatica. She reports constipation. She also has pica, which has improved since starting iron supplements. She has noticed memory changes over the past year. She does not drive.   Epilepsy Risk Factors:  There is a strong family history of seizures. Her paternal uncle died from a seizure. Her maternal aunt, mother, and nephew and niece (sister's children) have seizures. Otherwise she had a normal birth and early development.  There is no history of febrile convulsions, CNS infections such as meningitis/encephalitis, significant traumatic brain injury, neurosurgical procedures.  Laboratory Data: Lab Results  Component Value Date   WBC 8.4 09/29/2015   HGB 11.6* 09/29/2015   HCT 37.4 09/29/2015   MCV 82.7 09/29/2015   PLT 310 09/29/2015       Chemistry      Component Value Date/Time   NA 138 09/29/2015 1518   K 3.7 09/29/2015 1518  CL 101 09/29/2015 1518   CO2 25 09/29/2015 1518   BUN 9 09/29/2015 1518   CREATININE 0.78 09/29/2015 1518   CREATININE 0.85 08/25/2015 1923      Component Value Date/Time   CALCIUM 9.7 09/29/2015 1518   ALKPHOS 91 09/29/2015 1518   AST 171* 09/29/2015 1518   ALT 84* 09/29/2015 1518   BILITOT 0.3 09/29/2015 1518      Lab Results  Component Value Date   VALPROATE 37.1* 08/08/2015    PAST MEDICAL HISTORY: Past Medical History  Diagnosis Date  . Sciatic pain   . Schizoaffective disorder   . Hepatitis C   . Arthritis   . Asthma   . Migraine   . Allergy   . Depression   . Anxiety   . Alopecia   . Substance abuse   . Hypertension     onset age 52.  [name] Shortness of breath dyspnea   . COPD (chronic obstructive pulmonary disease) ([place])   . Schizoaffective disorder ([place])   . GERD (gastroesophageal reflux disease)     o cc- takes OTC if needed.  . Seizures ([place])     onset in childhood.  Generalized tonic-clonic.Last seizure couple of months ago    PAST SURGICAL HISTORY: Past Surgical History  Procedure Laterality Date  . Dilation and curettage of uterus      x2  . Exploratory laparotomy      x3  . Ovarian cyst removal  2008  . Abdominal hysterectomy      ovarian cyst B, cervical dysplasia, fibroids.   Ovaries intact.  . Gynecologic cryosurgery      x3  . Mandible reconstruction N/A 06/27/2015    Procedure: REMOVAL MAXILLARY PALATAL TORUS.  REMOVAL MANDIBULAR TORUS AND EXOSTOSIS.  ;  Surgeon: Diona Browner, DDS;  Location: [facility];  Service: Oral Surgery;  Laterality: N/A;    MEDICATIONS: Current Outpatient Prescriptions on File Prior to Visit  Medication Sig Dispense Refill  . albuterol (PROVENTIL HFA;VENTOLIN HFA) 108 (90 BASE) MCG/ACT inhaler Inhale 2 puffs into the lungs every 6 (six) hours as needed for shortness of breath. 1 Inhaler 3  . amLODipine (NORVASC) 5 MG tablet TAKE 1 TABLET (5 MG TOTAL) BY MOUTH DAILY. HIGH BLOOD PRESSURE 30 tablet 2  . budesonide-formoterol (SYMBICORT) 80-4.5 MCG/ACT inhaler Inhale 2 puffs into the lungs daily as needed (for shortness of breath). 1 Inhaler 12  . citalopram (CELEXA) 10 MG tablet Take 1 tablet (10 mg total) by mouth daily. For depression 30 tablet 2  . conjugated estrogens (PREMARIN) vaginal cream Place 1 Applicatorful vaginally daily. 30 g  12  . divalproex (DEPAKOTE) 500 MG DR tablet Take 1 tablet (500 mg total) by mouth 3 (three) times daily. 90 tablet 3  . ferrous sulfate 325 (65 FE) MG tablet Take 1 tablet (325 mg total) by mouth daily with breakfast. 90 tablet 1  . ipratropium-albuterol (DUONEB) 0.5-2.5 (3) MG/3ML SOLN Take 3 mLs by nebulization every 4 (four) hours as needed (shortness of breath).     . methocarbamol (ROBAXIN) 750 MG tablet Take 1 tablet (750 mg total) by mouth every 8 (eight) hours as needed for muscle spasms. 30 tablet 0  . naproxen (NAPROSYN) 500 MG tablet Take 1 tablet (500 mg total) by mouth 2 (two) times daily with a meal. 45 tablet 0  . oxyCODONE-acetaminophen (PERCOCET/ROXICET) 5-325 MG tablet Take 1 tablet by mouth every 6 (six) hours as needed for severe pain. 30 tablet 0  . QUEtiapine (SEROQUEL) 300  MG tablet Take 1 tablet (300 mg total) by mouth at bedtime. Mood control 30 tablet 2   No current facility-administered medications on file prior to visit.    ALLERGIES: Allergies  Allergen Reactions  . Fruit & Vegetable Daily [Nutritional Supplements] Shortness Of Breath    Aloe  . Ibuprofen Other (See Comments)    Stomach upset; However, pt can take Meloxicam without incident and takes this at home.   . Aspirin Rash and Other (See Comments)    Stomach upset    FAMILY HISTORY: Family History  Problem Relation Age of Onset  . Diabetes type II Mother   . Hypertension Mother   . Arthritis Mother   . Diabetes Mother   . Mental illness Mother     bipolar  . Diabetes type II Maternal Aunt   . Cancer Maternal Uncle   . Hypertension Father   . Heart murmur Father   . Arthritis Father   . Stroke Father   . Alopecia Sister   . Alopecia Brother   . Alopecia Sister   . Mental retardation Sister     depression    SOCIAL HISTORY: Social History   Social History  . Marital Status: Widowed    Spouse Name: N/A  . Number of Children: 1  . Years of Education: N/A   Occupational History   . disabled     mental illness; seizures   Social History Main Topics  . Smoking status: Current Every Day Smoker -- 0.33 packs/day for 44 years    Types: Cigarettes  . Smokeless tobacco: Never Used  . Alcohol Use: 8.4 oz/week    14 Cans of beer per week     Comment: daily alcohol  . Drug Use: No     Comment: 06/26/15- dfenies  . Sexual Activity: Yes    Birth Control/ Protection: None     Comment: widow   Other Topics Concern  . Not on file   Social History Narrative   Marital status:  Widowed since 2002.  Married x 16 years. + dating.  Moved from [place] to live with daughter in 2013.     Children:  One child/daughter (33); two grandchildren.      Lives: with daughter, grandchildren 2.  Does not drive due to epilepsy.      Employment:  Disability for schizoaffective disorder.      Tobacco: 1 ppd x since 8th grade.      Alcohol:  Social; rare drinking due to seizure medications.  Weekends.       Drugs: none; previous use of marijuana.  Previous iv drug use, cocaine.      Exercise: none      Seatbelt:  100%      Guns: none    REVIEW OF SYSTEMS: Constitutional: No fevers, chills, or sweats, no generalized fatigue, change in appetite Eyes: No visual changes, double vision, eye pain Ear, nose and throat: No hearing loss, ear pain, nasal congestion, sore throat Cardiovascular: No chest pain, palpitations Respiratory:  No shortness of breath at rest or with exertion, wheezes GastrointestinaI: No nausea, vomiting, diarrhea, abdominal pain, fecal incontinence Genitourinary:  No dysuria, urinary retention or frequency Musculoskeletal:  No neck pain, back pain Integumentary: No rash, pruritus, skin lesions Neurological: as above Psychiatric: + depression, insomnia, anxiety Endocrine: No palpitations, fatigue, diaphoresis, mood swings, change in appetite, change in weight, increased thirst Hematologic/Lymphatic:  No anemia, purpura, petechiae. Allergic/Immunologic: no  itchy/runny eyes, nasal congestion, recent allergic reactions, rashes  PHYSICAL  EXAM: Filed Vitals:   10/15/15 1413  BP: 104/64  Pulse: 89  Resp: 14   General: No acute distress, very restless, movements suggestive of akathesia Head:  Normocephalic/atraumatic Eyes: Fundoscopic exam shows bilateral sharp discs, no vessel changes, exudates, or hemorrhages Neck: supple, no paraspinal tenderness, full range of motion Back: No paraspinal tenderness Heart: regular rate and rhythm Lungs: Clear to auscultation bilaterally. Vascular: No carotid bruits. Skin/Extremities: No rash, no edema Neurological Exam: Mental status: alert and oriented to person, place, and time, no dysarthria or aphasia, Fund of knowledge is appropriate.  Remote memory intact. 1/3 delayed recall.  Attention and concentration are normal.    Able to name objects and repeat phrases. Cranial nerves: CN I: not tested CN II: pupils equal, round and reactive to light, visual fields intact, fundi unremarkable. CN III, IV, VI:  full range of motion, no nystagmus, no ptosis CN V: facial sensation intact CN VII: upper and lower face symmetric CN VIII: hearing intact to finger rub CN IX, X: gag intact, uvula midline CN XI: sternocleidomastoid and trapezius muscles intact CN XII: tongue midline Bulk & Tone: normal, no fasciculations. Motor: 5/5 throughout with no pronator drift. Sensation: intact to light touch, cold, pin, vibration and joint position sense.  No extinction to double simultaneous stimulation.  Romberg test negative Deep Tendon Reflexes: +2 throughout, no ankle clonus Plantar responses: downgoing bilaterally Cerebellar: no incoordination on finger to nose, heel to shin. No dysdiadochokinesia Gait: narrow-based and steady, able to tandem walk adequately. 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 Last seizure was 2 weeks ago. She reports some compliance issues with Depakote due to mood changes when she takes it regularly. We discussed better control of seizures with medication compliance, she will start taking Depakote 1500mg  qhs regularly and a Depakote level will be checked in 2 weeks. Continue to monitor mood, she will be seeing Psychiatry next month. MRI brain with and without contrast and routine EEG will be ordered to classify her seizures. If mood changes become an issue with Depakote, consideration for switching to Lamotrigine or oxcarbazepine will be done, which can also help with mood stabilization. [place] driving laws were discussed with the patient, and she knows to stop driving after a seizure, until 6 months seizure-free. She does not drive. She will follow-up in 1 month and knows to call for any changes.   Thank you for allowing me to participate in the care of this patient. Please do not hesitate to call for any questions or concerns.   Ellouise Newer, M.D.  CC: Dr. Tamala Julian

## 2015-10-16 DIAGNOSIS — G40009 Localization-related (focal) (partial) idiopathic epilepsy and epileptic syndromes with seizures of localized onset, not intractable, without status epilepticus: Secondary | ICD-10-CM | POA: Insufficient documentation

## 2015-10-21 ENCOUNTER — Ambulatory Visit (HOSPITAL_COMMUNITY)
Admission: RE | Admit: 2015-10-21 | Discharge: 2015-10-21 | Disposition: A | Discharge status: Home / Self Care | Payer: Medicare Other | Source: Ambulatory Visit | Attending: Internal Medicine | Admitting: Internal Medicine

## 2015-10-21 ENCOUNTER — Telehealth: Payer: Self-pay | Admitting: *Deleted

## 2015-10-21 DIAGNOSIS — B1920 Unspecified viral hepatitis C without hepatic coma: Secondary | ICD-10-CM | POA: Diagnosis present

## 2015-10-21 DIAGNOSIS — C539 Malignant neoplasm of cervix uteri, unspecified: Secondary | ICD-10-CM | POA: Diagnosis not present

## 2015-10-21 DIAGNOSIS — Z90710 Acquired absence of both cervix and uterus: Secondary | ICD-10-CM | POA: Insufficient documentation

## 2015-10-21 DIAGNOSIS — B182 Chronic viral hepatitis C: Secondary | ICD-10-CM

## 2015-10-21 NOTE — Telephone Encounter (Signed)
-----   Message from Thayer Headings, MD sent at 10/21/2015 12:16 PM EST ----- elastography was done.  Can you remind her she will need to be seen by [name] for alcohol abuse before we can prescribe Hep C medication - unless she already went through an outside program. thanks

## 2015-10-21 NOTE — Telephone Encounter (Signed)
Scheduled appt with [name] for 11/05/15. Melissa Bridges

## 2015-10-29 ENCOUNTER — Ambulatory Visit
Admission: RE | Admit: 2015-10-29 | Discharge: 2015-10-29 | Disposition: A | Discharge status: Home / Self Care | Payer: Medicare Other | Source: Ambulatory Visit | Attending: Neurology | Admitting: Neurology

## 2015-10-29 ENCOUNTER — Other Ambulatory Visit: Payer: Self-pay | Admitting: Family Medicine

## 2015-10-29 ENCOUNTER — Other Ambulatory Visit: Payer: Medicare Other

## 2015-10-29 ENCOUNTER — Ambulatory Visit (INDEPENDENT_AMBULATORY_CARE_PROVIDER_SITE_OTHER): Payer: Medicare Other | Admitting: Neurology

## 2015-10-29 DIAGNOSIS — G40009 Localization-related (focal) (partial) idiopathic epilepsy and epileptic syndromes with seizures of localized onset, not intractable, without status epilepticus: Secondary | ICD-10-CM

## 2015-10-29 DIAGNOSIS — G40909 Epilepsy, unspecified, not intractable, without status epilepticus: Secondary | ICD-10-CM | POA: Diagnosis not present

## 2015-10-29 MED ORDER — GADOBENATE DIMEGLUMINE 529 MG/ML IV SOLN
10.0000 mL | Freq: Once | INTRAVENOUS | Status: AC | PRN
  Administered 2015-10-29: 10 mL via INTRAVENOUS

## 2015-10-30 ENCOUNTER — Ambulatory Visit (INDEPENDENT_AMBULATORY_CARE_PROVIDER_SITE_OTHER): Payer: Medicare Other | Admitting: Family Medicine

## 2015-10-30 VITALS — BP 140/76 | HR 72 | Temp 98.8°F | Resp 16 | Ht 63.0 in | Wt 122.4 lb

## 2015-10-30 DIAGNOSIS — M79604 Pain in right leg: Secondary | ICD-10-CM

## 2015-10-30 DIAGNOSIS — M549 Dorsalgia, unspecified: Secondary | ICD-10-CM | POA: Diagnosis not present

## 2015-10-30 LAB — CREATININE, SERUM: CREATININE: 0.87 mg/dL (ref 0.50–1.05)

## 2015-10-30 LAB — VALPROIC ACID LEVEL: VALPROIC ACID LVL: 120.2 ug/mL — AB (ref 50.0–100.0)

## 2015-10-30 LAB — BUN: BUN: 11 mg/dL (ref 7–25)

## 2015-10-30 MED ORDER — OXYCODONE-ACETAMINOPHEN 5-325 MG PO TABS: 1.0000 | ORAL_TABLET | Freq: Four times a day (QID) | ORAL | Status: DC | PRN

## 2015-10-30 NOTE — Patient Instructions (Signed)
You can use the oxycodone as needed for back pain- remember this may make you feel sleepy!  Let us know if your back is not feeling better soon

## 2015-10-30 NOTE — Progress Notes (Signed)
[facility] [address] [place] [address] [phone]  Date:  10/30/2015   Name:  Melissa Bridges   DOB:  20-Jan-1961   MRN:  [id number]  PCP:  Reginia Forts, MD    Chief Complaint: Back Pain and Medication Refill   History of Present Illness:  Melissa Bridges is a 55 y.o. very pleasant female patient who presents with the following:  She has been seen several times over the last few months for her right sided back pain and sciatica. Last visit with me was in August.  MRI from earlier that month showed the following:  IMPRESSION: 1. Mild disc and moderate facet degeneration in the lumbar spine without significant interval change. 2. Unchanged right foraminal/extraforaminal disc protrusion at L4-5 potentially affecting the right L4 nerve root. 3. Unchanged, mild multifactorial spinal stenosis at L2-3 and L3-4.  She was seen for her sciatica again on 10/26 by Dr. Tamala Julian- her back did get better and then the pain came back.  She was moving some things around in her home in preparation for thanksgiving which seemed to exacerbate her pain.   She notes pain in her right lower back and going down the right leg.  She was not able to do PT, and steroids exacerbate her mental health concerns.   Surgery now seems like her best option. They plan to operate on her back in January per Dr. Lynann Bologna.  She is having her usual pain right now- "it's the same, it's been like 2 days now"  she may have some numbness in her leg and it "gave out" while she was moving some furniture.  No bowel or bladder dysfunction  Checked [facility]- last rx was for #30 oxycodone 5 from Dr. Tamala Julian on 10/27.  No unexpected entries  Patient Active Problem List   Diagnosis Date Noted  . Localization-related idiopathic epilepsy and epileptic syndromes with seizures of localized onset, not intractable, without status epilepticus ([place]) 10/16/2015  . 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 Elevated LFTs  09/09/2014  . Seizure disorder ([place]) 09/09/2014  . Alcohol abuse 09/06/2014  . Routine general medical examination at a health care facility 06/12/2013  . Routine gynecological examination 06/12/2013  . Screening for diabetes mellitus 06/12/2013  . Asthma with acute exacerbation 03/09/2013  . Hypertension 03/09/2013  . Lower back pain 03/09/2013  . Chronic hepatitis C without hepatic coma ([place]) 03/08/2013  . Smoker 12/12/2012  . Seizures ([place]) 12/12/2012  . Schizoaffective disorder ([place]) 12/12/2012  . Substance abuse 11/14/2011    Past Medical History  Diagnosis Date  . Sciatic pain   . Schizoaffective disorder   . Hepatitis C   . Arthritis   . Asthma   . Migraine   . Allergy   . Depression   . Anxiety   . Alopecia   . Substance abuse   . Hypertension     onset age 46.  [name] Shortness of breath dyspnea   . COPD (chronic obstructive pulmonary disease) ([place])   . Schizoaffective disorder ([place])   . GERD (gastroesophageal reflux disease)     o cc- takes OTC if needed.  . Seizures ([place])     onset in childhood.  Generalized tonic-clonic.Last seizure couple of months ago    Past Surgical History  Procedure Laterality Date  . Dilation and curettage of uterus      x2  . Exploratory laparotomy      x3  . Ovarian cyst removal  2008  . Abdominal hysterectomy  ovarian cyst B, cervical dysplasia, fibroids.   Ovaries intact.  . Gynecologic cryosurgery      x3  . Mandible reconstruction N/A 06/27/2015    Procedure: REMOVAL MAXILLARY PALATAL TORUS.  REMOVAL MANDIBULAR TORUS AND EXOSTOSIS.  ;  Surgeon: Diona Browner, DDS;  Location: [facility];  Service: Oral Surgery;  Laterality: N/A;    Social History  Substance Use Topics  . Smoking status: Current Every Day Smoker -- 0.33 packs/day for 44 years    Types: Cigarettes  . Smokeless tobacco: Never Used  . Alcohol Use: 8.4 oz/week    14 Cans of beer per week     Comment: daily alcohol    Family History  Problem Relation Age of  Onset  . Diabetes type II Mother   . Hypertension Mother   . Arthritis Mother   . Diabetes Mother   . Mental illness Mother     bipolar  . Diabetes type II Maternal Aunt   . Cancer Maternal Uncle   . Hypertension Father   . Heart murmur Father   . Arthritis Father   . Stroke Father   . Alopecia Sister   . Alopecia Brother   . Alopecia Sister   . Mental retardation Sister     depression    Allergies  Allergen Reactions  . Fruit & Vegetable Daily [Nutritional Supplements] Shortness Of Breath    Aloe  . Ibuprofen Other (See Comments)    Stomach upset; However, pt can take Meloxicam without incident and takes this at home.   . Aspirin Rash and Other (See Comments)    Stomach upset    Medication list has been reviewed and updated.  Current Outpatient Prescriptions on File Prior to Visit  Medication Sig Dispense Refill  . albuterol (PROVENTIL HFA;VENTOLIN HFA) 108 (90 BASE) MCG/ACT inhaler Inhale 2 puffs into the lungs every 6 (six) hours as needed for shortness of breath. 1 Inhaler 3  . amLODipine (NORVASC) 5 MG tablet TAKE 1 TABLET (5 MG TOTAL) BY MOUTH DAILY. HIGH BLOOD PRESSURE 30 tablet 2  . budesonide-formoterol (SYMBICORT) 80-4.5 MCG/ACT inhaler Inhale 2 puffs into the lungs daily as needed (for shortness of breath). 1 Inhaler 12  . citalopram (CELEXA) 10 MG tablet Take 1 tablet (10 mg total) by mouth daily. For depression 30 tablet 2  . conjugated estrogens (PREMARIN) vaginal cream Place 1 Applicatorful vaginally daily. 30 g 12  . divalproex (DEPAKOTE) 500 MG DR tablet Take 1 tablet (500 mg total) by mouth 3 (three) times daily. 90 tablet 3  . ferrous sulfate 325 (65 FE) MG tablet Take 1 tablet (325 mg total) by mouth daily with breakfast. 90 tablet 1  . ipratropium-albuterol (DUONEB) 0.5-2.5 (3) MG/3ML SOLN Take 3 mLs by nebulization every 4 (four) hours as needed (shortness of breath).     . methocarbamol (ROBAXIN) 750 MG tablet Take 1 tablet (750 mg total) by mouth  every 8 (eight) hours as needed for muscle spasms. 30 tablet 0  . oxyCODONE-acetaminophen (PERCOCET/ROXICET) 5-325 MG tablet Take 1 tablet by mouth every 6 (six) hours as needed for severe pain. 30 tablet 0  . QUEtiapine (SEROQUEL) 300 MG tablet Take 1 tablet (300 mg total) by mouth at bedtime. Mood control 30 tablet 2  . naproxen (NAPROSYN) 500 MG tablet Take 1 tablet (500 mg total) by mouth 2 (two) times daily with a meal. (Patient not taking: Reported on 10/30/2015) 45 tablet 0   No current facility-administered medications on file prior to visit.  Review of Systems:  As per HPI- otherwise negative.   Physical Examination: Filed Vitals:   10/30/15 1508  BP: 140/76  Pulse: 72  Temp: 98.8 F (37.1 C)  Resp: 16   Filed Vitals:   10/30/15 1508  Height: 5\' 3"  (1.6 m)  Weight: 122 lb 6.4 oz (55.52 kg)   Body mass index is 21.69 kg/(m^2). Ideal Body Weight: Weight in (lb) to have BMI = 25: 140.8  GEN: WDWN, NAD, Non-toxic, A & O x 3, slim build, looks well HEENT: Atraumatic, Normocephalic. Neck supple. No masses, No LAD. Ears and Nose: No external deformity. CV: RRR, No M/G/R. No JVD. No thrill. No extra heart sounds. PULM: CTA B, no wheezes, crackles, rhonchi. No retractions. No resp. distress. No accessory muscle use. EXTR: No c/c/e NEURO Normal gait.  PSYCH: Normally interactive. Conversant. Not depressed or anxious appearing.  Calm demeanor.  Right lower back tenderness, positive SLR on the right, weakness of the right leg at hamstring and quad.  No numbness or saddle anesthesia however.  Decreased patellar DTR on the right compared to left.     Assessment and Plan: Pain of back and right lower extremity - Plan: oxyCODONE-acetaminophen (PERCOCET/ROXICET) 5-325 MG tablet  Recurrent pain from known disc disease in her lower back. Refilled her oxycodone for pain.  She plans to have surgery at some point in the next few months- expect they plan a discectomy   Signed Lamar Blinks, MD

## 2015-10-31 ENCOUNTER — Telehealth: Payer: Self-pay | Admitting: Neurology

## 2015-10-31 NOTE — Telephone Encounter (Signed)
PT called and had a question regarding her MRI/Dawn CB# [phone]

## 2015-10-31 NOTE — Telephone Encounter (Signed)
Returned call. Patient has questions about MRI brain scan scheduled for Monday @ [facility]. She states that she already had an MRI brain this week at [facility]. Patient was questioning if she needed to keep her appt that is scheduled @ [facility]. Told patient that appt isn't required since she has already had the MRI scan. Will cancel upcoming appt with [facility].

## 2015-10-31 NOTE — Procedures (Signed)
ELECTROENCEPHALOGRAM REPORT  Date of Study: 10/29/2015  Patient's Name: Melissa Bridges MRN: [id number] Date of Birth: [birth year]  Referring Provider: Dr. Ellouise Newer  Clinical History: This is a 54 year old woman with recurrent seizures preceded by a headache or high pitched sound, followed by loss of hearing, then she loses consciousness and has stiffening and shaking. Sometimes she smells blood and feels sick before a seizure. EEG for classification.  Medications: Depakote, albuterol, Norvasc, Symbicort, Celexa, Premarin, Duoneb, 65Fe, Robaxin, Naprosyn, Percocet/Roxicet, Seroquel  Technical Summary: A multichannel digital EEG recording measured by the international 10-20 system with electrodes applied with paste and impedances below 5000 ohms performed in our laboratory with EKG monitoring in an awake and drowsy patient.  Hyperventilation and photic stimulation were performed.  The digital EEG was referentially recorded, reformatted, and digitally filtered in a variety of bipolar and referential montages for optimal display.    Description: The patient is awake and drowsy during the recording.  During maximal wakefulness, there is a symmetric, medium voltage 9-9.5 Hz posterior dominant rhythm that attenuates with eye opening.  The record is symmetric.  During drowsiness, there is an increase in theta slowing of the background.  Deeper stages of sleep were not seen.  Hyperventilation and photic stimulation did not elicit any abnormalities.  There were no epileptiform discharges or electrographic seizures seen.    EKG lead was unremarkable.  Impression: This awake and drowsy EEG is normal.    Clinical Correlation: A normal EEG does not exclude a clinical diagnosis of epilepsy.  If further clinical questions remain, prolonged EEG may be helpful.  Clinical correlation is advised.   Ellouise Newer, M.D.

## 2015-11-04 ENCOUNTER — Telehealth: Payer: Self-pay | Admitting: Family Medicine

## 2015-11-04 NOTE — Telephone Encounter (Signed)
Patient was notified of results and advisement. 

## 2015-11-04 NOTE — Telephone Encounter (Signed)
-----   Message from Cameron Sprang, MD sent at 11/04/2015  8:45 AM EST ----- Pls let her know I reviewed MRI brain, it is unremarkable, no evidence of tumor, stroke, or bleed. EEG is normal. Continue Depakote. Thanks

## 2015-11-05 ENCOUNTER — Ambulatory Visit: Payer: Self-pay | Admitting: *Deleted

## 2015-11-24 ENCOUNTER — Ambulatory Visit: Payer: Self-pay | Admitting: Gastroenterology

## 2015-12-02 ENCOUNTER — Ambulatory Visit (INDEPENDENT_AMBULATORY_CARE_PROVIDER_SITE_OTHER): Payer: Medicare Other | Admitting: Neurology

## 2015-12-02 ENCOUNTER — Encounter: Payer: Self-pay | Admitting: Neurology

## 2015-12-02 VITALS — BP 108/68 | HR 86 | Ht 63.25 in | Wt 118.0 lb

## 2015-12-02 DIAGNOSIS — G40009 Localization-related (focal) (partial) idiopathic epilepsy and epileptic syndromes with seizures of localized onset, not intractable, without status epilepticus: Secondary | ICD-10-CM

## 2015-12-02 DIAGNOSIS — F259 Schizoaffective disorder, unspecified: Secondary | ICD-10-CM

## 2015-12-02 MED ORDER — LAMOTRIGINE ER 25 MG PO TB24
ORAL_TABLET | ORAL | Status: DC
Start: 2015-12-02 — End: 2018-05-24

## 2015-12-02 NOTE — Progress Notes (Signed)
NEUROLOGY FOLLOW UP OFFICE NOTE  [name] [id number]  HISTORY OF PRESENT ILLNESS: I had the pleasure of seeing Melissa Bridges in follow-up in the neurology clinic on 12/02/2015.  The patient was last seen 6 weeks ago for recurrent seizures. Records and images were personally reviewed where available.  I personally reviewed MRI brain with and without contrast which did not show any acute changes, hippocampi symmetric without abnormal signal or enhancement seen. 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 Her wake and drowsy EEG was normal. She denies any seizures since October 2016. On her last visit, she was instructed to take Depakote regularly, and repeat Depakote level was 120.2. She reports that taking Depakote daily made her drowsy, even if reducing it to 2 tablets daily. She reports taking the medication only twice a week, taking 3 tablets at a time. She states the Depakote is for her bipolar disorder, she only takes it when she is feeling bad or stressed. She denies any headaches, dizziness, diplopia, focal numbness/tingling/weakness. She has a history of auditory and visual hallucinations, and has not seen Psychiatry yet.   HPI: This is a pleasant 54 yo RH woman with a history of hypertension, schizoaffective disorder, and seizures since childhood, with recurrent seizures. She has "always had seizures." She would usually have a headache or hear a high pitched sound, followed by loss of hearing, then she loses consciousness and has stiffening and shaking. Sometimes she smells blood and feels sick before a seizure. In the past she could feel them coming on but recently they "just hit." She has injured herself and woken up on the ground, hit her head a few times, with tongue bite and urinary incontinence. Her daughter has never seen any of the seizures, but reports her granddaughter has witnessed the stiffening and shaking,  and has seen a couple of episodes where she blanks out for a second or so. The last seizure she is aware of occurred September 2016, however she was told by a friend that she had 2 seizures in her sleep in October 2016, she had woken up feeling sore. Her longest seizure-free interval has been at least 2-3 months. She recalls being on Depakote and Dilantin in the past when she was having seizures "back to back," then when they quieted down some, she was taken off Dilantin and has been on Depakote for many years now. Her daughter reports some compliance issues, a Depakote level in August 2016 was low at 37.1. She is supposed to be taking Depakote DR 1500mg  qhs. In the past she was instructed to take it TID, but felt better taking them all at the same time. Her daughter reports that she does not take it regularly because it affects her mood. With her schizoaffective disorder, she reports auditory and visual hallucinations. The visual hallucinations are better, she is still hearing things but not like before.   Epilepsy Risk Factors: There is a strong family history of seizures. Her paternal uncle died from a seizure. Her maternal aunt, mother, and nephew and niece (sister's children) have seizures. Otherwise she had a normal birth and early development. There is no history of febrile convulsions, CNS infections such as meningitis/encephalitis, significant traumatic brain injury, neurosurgical procedures.  PAST MEDICAL HISTORY: Past Medical History  Diagnosis Date  . Sciatic pain   . Schizoaffective disorder   . Hepatitis C   . Arthritis   . Asthma   . Migraine   .  Allergy   . Depression   . Anxiety   . Alopecia   . Substance abuse   . Hypertension     onset age 23.  [name] Shortness of breath dyspnea   . COPD (chronic obstructive pulmonary disease) ([place])   . Schizoaffective disorder ([place])   . GERD (gastroesophageal reflux disease)     o cc- takes OTC if needed.  . Seizures ([place])     onset in  childhood.  Generalized tonic-clonic.Last seizure couple of months ago  . Iron deficiency anemia     MEDICATIONS: Current Outpatient Prescriptions on File Prior to Visit  Medication Sig Dispense Refill  . albuterol (PROVENTIL HFA;VENTOLIN HFA) 108 (90 BASE) MCG/ACT inhaler Inhale 2 puffs into the lungs every 6 (six) hours as needed for shortness of breath. 1 Inhaler 3  . amLODipine (NORVASC) 5 MG tablet TAKE 1 TABLET (5 MG TOTAL) BY MOUTH DAILY. HIGH BLOOD PRESSURE 30 tablet 2  . budesonide-formoterol (SYMBICORT) 80-4.5 MCG/ACT inhaler Inhale 2 puffs into the lungs daily as needed (for shortness of breath). 1 Inhaler 12  . citalopram (CELEXA) 10 MG tablet Take 1 tablet (10 mg total) by mouth daily. For depression 30 tablet 2  . conjugated estrogens (PREMARIN) vaginal cream Place 1 Applicatorful vaginally daily. 30 g 12  . divalproex (DEPAKOTE) 500 MG DR tablet Take 1 tablet (500 mg total) by mouth 3 (three) times daily. 90 tablet 3  . ferrous sulfate 325 (65 FE) MG tablet Take 1 tablet (325 mg total) by mouth daily with breakfast. 90 tablet 1  . methocarbamol (ROBAXIN) 750 MG tablet Take 1 tablet (750 mg total) by mouth every 8 (eight) hours as needed for muscle spasms. 30 tablet 0  . naproxen (NAPROSYN) 500 MG tablet TAKE 1 TABLET (500 MG TOTAL) BY MOUTH 2 (TWO) TIMES DAILY WITH A MEAL. 180 tablet 1  . QUEtiapine (SEROQUEL) 300 MG tablet Take 1 tablet (300 mg total) by mouth at bedtime. Mood control 30 tablet 2  . ipratropium-albuterol (DUONEB) 0.5-2.5 (3) MG/3ML SOLN Take 3 mLs by nebulization every 4 (four) hours as needed (shortness of breath). Reported on 12/02/2015    . oxyCODONE-acetaminophen (PERCOCET/ROXICET) 5-325 MG tablet Take 1 tablet by mouth every 6 (six) hours as needed for severe pain. (Patient not taking: Reported on 12/02/2015) 30 tablet 0   No current facility-administered medications on file prior to visit.    ALLERGIES: Allergies  Allergen Reactions  . Fruit &  Vegetable Daily [Nutritional Supplements] Shortness Of Breath    Aloe  . Ibuprofen Other (See Comments)    Stomach upset; However, pt can take Meloxicam without incident and takes this at home.   . Aspirin Rash and Other (See Comments)    Stomach upset    FAMILY HISTORY: Family History  Problem Relation Age of Onset  . Diabetes type II Mother   . Hypertension Mother   . Arthritis Mother   . Diabetes Mother   . Mental illness Mother     bipolar  . Diabetes type II Maternal Aunt   . Cancer Maternal Uncle   . Hypertension Father   . Heart murmur Father   . Arthritis Father   . Stroke Father   . Alopecia Sister   . Alopecia Brother   . Alopecia Sister   . Mental retardation Sister     depression    SOCIAL HISTORY: Social History   Social History  . Marital Status: Widowed    Spouse Name: N/A  . Number  of Children: 1  . Years of Education: N/A   Occupational History  . disabled     mental illness; seizures   Social History Main Topics  . Smoking status: Current Every Day Smoker -- 0.33 packs/day for 44 years    Types: Cigarettes  . Smokeless tobacco: Never Used  . Alcohol Use: 8.4 oz/week    14 Cans of beer per week     Comment: daily alcohol  . Drug Use: No     Comment: 06/26/15- dfenies  . Sexual Activity: Yes    Birth Control/ Protection: None     Comment: widow   Other Topics Concern  . Not on file   Social History Narrative   Marital status:  Widowed since 2002.  Married x 16 years. + dating.  Moved from [place] to live with daughter in 2013.     Children:  One child/daughter (33); two grandchildren.      Lives: with daughter, grandchildren 2.  Does not drive due to epilepsy.      Employment:  Disability for schizoaffective disorder.      Tobacco: 1 ppd x since 8th grade.      Alcohol:  Social; rare drinking due to seizure medications.  Weekends.       Drugs: none; previous use of marijuana.  Previous iv drug use, cocaine.      Exercise: none       Seatbelt:  100%      Guns: none    REVIEW OF SYSTEMS: Constitutional: No fevers, chills, or sweats, no generalized fatigue, change in appetite Eyes: No visual changes, double vision, eye pain Ear, nose and throat: No hearing loss, ear pain, nasal congestion, sore throat Cardiovascular: No chest pain, palpitations Respiratory:  No shortness of breath at rest or with exertion, wheezes GastrointestinaI: No nausea, vomiting, diarrhea, abdominal pain, fecal incontinence Genitourinary:  No dysuria, urinary retention or frequency Musculoskeletal:  No neck pain,+back pain Integumentary: No rash, pruritus, skin lesions Neurological: as above Psychiatric: No depression, insomnia, anxiety Endocrine: No palpitations, fatigue, diaphoresis, mood swings, change in appetite, change in weight, increased thirst Hematologic/Lymphatic:  No anemia, purpura, petechiae. Allergic/Immunologic: no itchy/runny eyes, nasal congestion, recent allergic reactions, rashes  PHYSICAL EXAM: Filed Vitals:   12/02/15 1112  BP: 108/68  Pulse: 86   General: No acute distress Head:  Normocephalic/atraumatic Neck: supple, no paraspinal tenderness, full range of motion Heart:  Regular rate and rhythm Lungs:  Clear to auscultation bilaterally Back: No paraspinal tenderness Skin/Extremities: No rash, no edema Neurological Exam: alert and oriented to person, place, and time. No aphasia or dysarthria. Fund of knowledge is appropriate.  Recent and remote memory are intact.  Attention and concentration are normal.    Able to name objects and repeat phrases. Cranial nerves: Pupils equal, round, reactive to light.  Fundoscopic exam unremarkable, no papilledema. Extraocular movements intact with no nystagmus. Visual fields full. Facial sensation intact. No facial asymmetry. Tongue, uvula, palate midline.  Motor: Bulk and tone normal, muscle strength 5/5 throughout with no pronator drift.  Sensation to light touch intact.  No  extinction to double simultaneous stimulation.  Deep tendon reflexes 2+ throughout, toes downgoing.  Finger to nose testing intact.  Gait narrow-based and steady, able to tandem walk adequately.  Romberg negative.  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 MRI brain and routine EEG unremarkable. Last  seizure was October 2016. She continues to be poorly compliant with Depakote because it makes her drowsy, taking it only twice a week. Continue taking as how she currently take it, but she is agreeable to switching to a different AED with mood-stabilizing properties, and will start Lamotrigine ER 25mg  daily x 2 weeks, with uptitration every 2 weeks to 100mg /day. Side effects, including Kathreen Cosier syndrome, were discussed. She was advised to speak with her Psychiatrist about Depakote intake and starting Lamotrigine on her visit next month. She does not drive. She will follow-up in 6 weeks and knows to call for any changes.   Thank you for allowing me to participate in her care.  Please do not hesitate to call for any questions or concerns.  The duration of this appointment visit was 24 minutes of face-to-face time with the patient.  Greater than 50% of this time was spent in counseling, explanation of diagnosis, planning of further management, and coordination of care.   Ellouise Newer, M.D.   CC: Dr. Tamala Julian

## 2015-12-02 NOTE — Patient Instructions (Signed)
1. Start Lamictal ER 25mg : take 1 tablet daily for 2 weeks, then increase to 2 tablets daily for 2 weeks, then increase to 4 tablets daily then see me in follow-up  2. Continue taking Depakote as you are currently doing 3. Follow-up in 6 weeks, call for any problems  Seizure Precautions: 1. If medication has been prescribed for you to prevent seizures, take it exactly as directed.  Do not stop taking the medicine without talking to your doctor first, even if you have not had a seizure in a long time.   2. Avoid activities in which a seizure would cause danger to yourself or to others.  Don't operate dangerous machinery, swim alone, or climb in high or dangerous places, such as on ladders, roofs, or girders.  Do not drive unless your doctor says you may.  3. If you have any warning that you may have a seizure, lay down in a safe place where you can't hurt yourself.    4.  No driving for 6 months from last seizure, as per [facility].   Please refer to the following link on the [place] website for more information: http://[email or web address]/[email or web address]/drivingu.cfm   5.  Maintain good sleep hygiene. Avoid alcohol.  6.  Contact your doctor if you have any problems that may be related to the medicine you are taking.  7.  Call 911 and bring the patient back to the ED if:        A.  The seizure lasts longer than 5 minutes.       B.  The patient doesn't awaken shortly after the seizure  C.  The patient has new problems such as difficulty seeing, speaking or moving  D.  The patient was injured during the seizure  E.  The patient has a temperature over 102 F (39C)  F.  The patient vomited and now is having trouble breathing

## 2015-12-04 ENCOUNTER — Telehealth: Payer: Self-pay | Admitting: Gastroenterology

## 2015-12-04 NOTE — Telephone Encounter (Signed)
Iron def anemia is the diagnosis.

## 2015-12-04 NOTE — Telephone Encounter (Signed)
Ok thanks 

## 2015-12-04 NOTE — Telephone Encounter (Signed)
That's okay. What is the diagnosis?

## 2015-12-06 ENCOUNTER — Ambulatory Visit (INDEPENDENT_AMBULATORY_CARE_PROVIDER_SITE_OTHER): Payer: Medicare Other | Admitting: Family Medicine

## 2015-12-06 VITALS — BP 122/78 | HR 80 | Temp 97.6°F | Resp 16 | Ht 64.0 in | Wt 122.0 lb

## 2015-12-06 DIAGNOSIS — M79604 Pain in right leg: Secondary | ICD-10-CM

## 2015-12-06 DIAGNOSIS — M549 Dorsalgia, unspecified: Secondary | ICD-10-CM

## 2015-12-06 DIAGNOSIS — M5431 Sciatica, right side: Secondary | ICD-10-CM

## 2015-12-06 MED ORDER — OXYCODONE-ACETAMINOPHEN 5-325 MG PO TABS: 1.0000 | ORAL_TABLET | Freq: Four times a day (QID) | ORAL | Status: DC | PRN

## 2015-12-06 NOTE — Progress Notes (Signed)
Chief Complaint:  Chief Complaint  Patient presents with  . Back Pain    for over a week    HPI: Melissa Bridges is a 54 y.o. female who reports to [facility] today complaining of normal right sided back pain with sciatica. No new changes, no urinary incontinence She has a hx of seizure, she cannot take stroids either because makes he "act out". She has ortho appt with [facility] and is planning to have surgery in March when her daughter is able to help her MRI shows spinal stenosis and also Herniated discs    Last ov from Dr Lorelei Pont in 09/2016   Melissa Bridges is a 54 y.o. very pleasant female patient who presents with the following:  She has been seen several times over the last few months for her right sided back pain and sciatica. Last visit with me was in August. MRI from earlier that month showed the following:  IMPRESSION: 1. Mild disc and moderate facet degeneration in the lumbar spine without significant interval change. 2. Unchanged right foraminal/extraforaminal disc protrusion at L4-5 potentially affecting the right L4 nerve root. 3. Unchanged, mild multifactorial spinal stenosis at L2-3 and L3-4.  She was seen for her sciatica again on 10/26 by Dr. Tamala Julian- her back did get better and then the pain came back. She was moving some things around in her home in preparation for thanksgiving which seemed to exacerbate her pain.   She notes pain in her right lower back and going down the right leg.  She was not able to do PT, and steroids exacerbate her mental health concerns. Surgery now seems like her best option. They plan to operate on her back in January per Dr. Lynann Bologna.  She is having her usual pain right now- "it's the same, it's been like 2 days now" she may have some numbness in her leg and it "gave out" while she was moving some furniture. No bowel or bladder dysfunction  Checked [facility]- last rx was for #30 oxycodone 5 from Dr. Tamala Julian on 10/27. No unexpected  entries   Past Medical History  Diagnosis Date  . Sciatic pain   . Schizoaffective disorder   . Hepatitis C   . Arthritis   . Asthma   . Migraine   . Allergy   . Depression   . Anxiety   . Alopecia   . Substance abuse   . Hypertension     onset age 72.  [name] Shortness of breath dyspnea   . COPD (chronic obstructive pulmonary disease) ([place])   . Schizoaffective disorder ([place])   . GERD (gastroesophageal reflux disease)     o cc- takes OTC if needed.  . Seizures ([place])     onset in childhood.  Generalized tonic-clonic.Last seizure couple of months ago  . Iron deficiency anemia    Past Surgical History  Procedure Laterality Date  . Dilation and curettage of uterus      x2  . Exploratory laparotomy      x3  . Ovarian cyst removal  2008  . Abdominal hysterectomy      ovarian cyst B, cervical dysplasia, fibroids.   Ovaries intact.  . Gynecologic cryosurgery      x3  . Mandible reconstruction N/A 06/27/2015    Procedure: REMOVAL MAXILLARY PALATAL TORUS.  REMOVAL MANDIBULAR TORUS AND EXOSTOSIS.  ;  Surgeon: Diona Browner, DDS;  Location: [place];  Service: Oral Surgery;  Laterality: N/A;   Social History   Social  History  . Marital Status: Widowed    Spouse Name: N/A  . Number of Children: 1  . Years of Education: N/A   Occupational History  . disabled     mental illness; seizures   Social History Main Topics  . Smoking status: Current Every Day Smoker -- 0.33 packs/day for 44 years    Types: Cigarettes  . Smokeless tobacco: Never Used  . Alcohol Use: 8.4 oz/week    14 Cans of beer per week     Comment: daily alcohol  . Drug Use: No     Comment: 06/26/15- dfenies  . Sexual Activity: Yes    Birth Control/ Protection: None     Comment: widow   Other Topics Concern  . None   Social History Narrative   Marital status:  Widowed since 2002.  Married x 16 years. + dating.  Moved from [place] to live with daughter in 2013.     Children:  One child/daughter (33); two  grandchildren.      Lives: with daughter, grandchildren 2.  Does not drive due to epilepsy.      Employment:  Disability for schizoaffective disorder.      Tobacco: 1 ppd x since 8th grade.      Alcohol:  Social; rare drinking due to seizure medications.  Weekends.       Drugs: none; previous use of marijuana.  Previous iv drug use, cocaine.      Exercise: none      Seatbelt:  100%      Guns: none   Family History  Problem Relation Age of Onset  . Diabetes type II Mother   . Hypertension Mother   . Arthritis Mother   . Diabetes Mother   . Mental illness Mother     bipolar  . Diabetes type II Maternal Aunt   . Cancer Maternal Uncle   . Hypertension Father   . Heart murmur Father   . Arthritis Father   . Stroke Father   . Alopecia Sister   . Alopecia Brother   . Alopecia Sister   . Mental retardation Sister     depression   Allergies  Allergen Reactions  . Fruit & Vegetable Daily [Nutritional Supplements] Shortness Of Breath    Aloe  . Ibuprofen Other (See Comments)    Stomach upset; However, pt can take Meloxicam without incident and takes this at home.   . Aspirin Rash and Other (See Comments)    Stomach upset   Prior to Admission medications   Medication Sig Start Date End Date Taking? Authorizing Provider  albuterol (PROVENTIL HFA;VENTOLIN HFA) 108 (90 BASE) MCG/ACT inhaler Inhale 2 puffs into the lungs every 6 (six) hours as needed for shortness of breath. 09/29/15  Yes Wardell Honour, MD  amLODipine (NORVASC) 5 MG tablet TAKE 1 TABLET (5 MG TOTAL) BY MOUTH DAILY. HIGH BLOOD PRESSURE 10/14/15  Yes Wardell Honour, MD  budesonide-formoterol [facility]) 80-4.5 MCG/ACT inhaler Inhale 2 puffs into the lungs daily as needed (for shortness of breath). 09/29/15  Yes Wardell Honour, MD  citalopram (CELEXA) 10 MG tablet Take 1 tablet (10 mg total) by mouth daily. For depression 12/21/14  Yes Posey Boyer, MD  conjugated estrogens (PREMARIN) vaginal cream Place 1 Applicatorful  vaginally daily. 09/29/15  Yes Wardell Honour, MD  divalproex (DEPAKOTE) 500 MG DR tablet Take 1 tablet (500 mg total) by mouth 3 (three) times daily. 08/08/15  Yes Darreld Mclean, MD  ferrous sulfate  325 (65 FE) MG tablet Take 1 tablet (325 mg total) by mouth daily with breakfast. 09/29/15  Yes Wardell Honour, MD  ipratropium-albuterol (DUONEB) 0.5-2.5 (3) MG/3ML SOLN Take 3 mLs by nebulization every 4 (four) hours as needed (shortness of breath). Reported on 12/02/2015   Yes Historical Provider, MD  methocarbamol (ROBAXIN) 750 MG tablet Take 1 tablet (750 mg total) by mouth every 8 (eight) hours as needed for muscle spasms. 05/29/15  Yes Elby Beck, FNP  naproxen (NAPROSYN) 500 MG tablet TAKE 1 TABLET (500 MG TOTAL) BY MOUTH 2 (TWO) TIMES DAILY WITH A MEAL. 10/31/15  Yes Gay Filler Copland, MD  QUEtiapine (SEROQUEL) 300 MG tablet Take 1 tablet (300 mg total) by mouth at bedtime. Mood control 08/08/15  Yes Darreld Mclean, MD  LamoTRIgine (LAMICTAL XR) 25 MG TB24 tablet take 1 tablet daily for 2 weeks, then increase to 2 tablets daily for 2 weeks, then increase to 4 tablets daily then see me in follow-up Patient not taking: Reported on 12/06/2015 12/02/15   Cameron Sprang, MD  oxyCODONE-acetaminophen (PERCOCET/ROXICET) 5-325 MG tablet Take 1 tablet by mouth every 6 (six) hours as needed for severe pain. Patient not taking: Reported on 12/02/2015 10/30/15   Darreld Mclean, MD     ROS: The patient denies fevers, chills, night sweats, unintentional weight loss, chest pain, palpitations, wheezing, dyspnea on exertion, nausea, vomiting, abdominal pain, dysuria, hematuria, melena, numbness, weakness, or tingling.  All other systems have been reviewed and were otherwise negative with the exception of those mentioned in the HPI and as above.    PHYSICAL EXAM: Filed Vitals:   12/06/15 0911  BP: 122/78  Pulse: 80  Temp: 97.6 F (36.4 C)  Resp: 16   Body mass index is 20.93  kg/(m^2).   General: Alert, no acute distress HEENT:  Normocephalic, atraumatic, oropharynx patent. EOMI, PERRLA Cardiovascular:  Regular rate and rhythm, no rubs murmurs or gallops.  Respiratory: Clear to auscultation bilaterally.  No wheezes, rales, or rhonchi.  No cyanosis, no use of accessory musculature Abdominal: No organomegaly, abdomen is soft and non-tender, positive bowel sounds. No masses. Skin: No rashes. Neurologic: Facial musculature symmetric. Psychiatric: Patient acts appropriately throughout our interaction. Lymphatic: No cervical or submandibular lymphadenopathy Musculoskeletal: Gait antalgic. No edema, tenderness Right lower back rtenderness, decrease ROM + straight leg right side +4/5 Right leg. 2/2 DTRs, neg for saddle anesthesia   LABS: Results for orders placed or performed in visit on 10/29/15  Valproic Acid level  Result Value Ref Range   Valproic Acid Lvl 120.2 (H) 50.0 - 100.0 ug/mL  BUN  Result Value Ref Range   BUN 11 7 - 25 mg/dL  Creatinine  Result Value Ref Range   Creat 0.87 0.50 - 1.05 mg/dL     EKG/XRAY:   Primary read interpreted by Dr. Marin Comment at [facility].   ASSESSMENT/PLAN: Encounter Diagnoses  Name Primary?  . Pain of back and right lower extremity Yes  . Right sided sciatica    I have referred her to pain management She is to fu with Dr Lynann Bologna [place] controlled substance DB pulled and no e/o illegal activities Has hx of seozires so cannot take tramadol, has NSAID allergies Fu prn    Gross sideeffects, risk and benefits, and alternatives of medications d/w patient. Patient is aware that all medications have potential sideeffects and we are unable to predict every sideeffect or drug-drug interaction that may occur.  Azlin Zilberman DO  12/10/2015 11:10 AM

## 2015-12-11 ENCOUNTER — Ambulatory Visit: Payer: Self-pay

## 2015-12-16 ENCOUNTER — Ambulatory Visit: Payer: Self-pay | Admitting: Internal Medicine

## 2015-12-23 ENCOUNTER — Telehealth: Payer: Self-pay

## 2015-12-23 NOTE — Telephone Encounter (Signed)
Betty at [facility] called to give an update on the patient's referral.  Dr Vira Blanco feels that unless she wants to cancel her surgery at [facility] and undergo spinal cord stimulation at his office, he feels there isn't anything he can do for her.  The facility would like to know how the patient would like to proceed.  I have not contacted her with this information yet, as she likely would need medical advise on the best course of action given her medical history.  Please advise, thank you.  CB# for [facility]: [phone] CB#: for patient: [phone]

## 2015-12-23 NOTE — Telephone Encounter (Signed)
Can you call her to let her know what her options are. I would prefer her to talk to her orthopedist about this before cancelling her surgery appt.   I also think that if this is the case then we can look at other pain management options, can we refer her to another pain management facility?   Thanks

## 2015-12-29 NOTE — Telephone Encounter (Signed)
Spoke to the patient and let her know about the pain management situation with [facility].  She said she would talk to her orthopedic doctor and see what the best course of action is for her medical situation.  She said the doctor had told her that this (surgery) is the only option left for her.  I told her if she wanted to proceed with the surgery to let us know, and we would find another pain management facility for her.

## 2016-01-02 ENCOUNTER — Ambulatory Visit (INDEPENDENT_AMBULATORY_CARE_PROVIDER_SITE_OTHER): Payer: Medicare Other | Admitting: Family Medicine

## 2016-01-02 ENCOUNTER — Encounter: Payer: Self-pay | Admitting: Family Medicine

## 2016-01-02 VITALS — BP 133/86 | HR 74 | Temp 98.5°F | Resp 16 | Ht 64.0 in | Wt 120.0 lb

## 2016-01-02 DIAGNOSIS — M549 Dorsalgia, unspecified: Secondary | ICD-10-CM

## 2016-01-02 DIAGNOSIS — F250 Schizoaffective disorder, bipolar type: Secondary | ICD-10-CM | POA: Diagnosis not present

## 2016-01-02 DIAGNOSIS — F1910 Other psychoactive substance abuse, uncomplicated: Secondary | ICD-10-CM

## 2016-01-02 DIAGNOSIS — I10 Essential (primary) hypertension: Secondary | ICD-10-CM

## 2016-01-02 DIAGNOSIS — F1010 Alcohol abuse, uncomplicated: Secondary | ICD-10-CM

## 2016-01-02 DIAGNOSIS — M79604 Pain in right leg: Secondary | ICD-10-CM | POA: Diagnosis not present

## 2016-01-02 DIAGNOSIS — B182 Chronic viral hepatitis C: Secondary | ICD-10-CM | POA: Diagnosis not present

## 2016-01-02 DIAGNOSIS — R569 Unspecified convulsions: Secondary | ICD-10-CM

## 2016-01-02 DIAGNOSIS — M5441 Lumbago with sciatica, right side: Secondary | ICD-10-CM

## 2016-01-02 MED ORDER — OXYCODONE-ACETAMINOPHEN 5-325 MG PO TABS: 1.0000 | ORAL_TABLET | Freq: Four times a day (QID) | ORAL | Status: DC | PRN

## 2016-01-02 MED ORDER — METHOCARBAMOL 750 MG PO TABS: 750.0000 mg | ORAL_TABLET | Freq: Three times a day (TID) | ORAL | Status: DC | PRN

## 2016-01-02 NOTE — Progress Notes (Signed)
Subjective:    Patient ID: Melissa Bridges, female    DOB: [birth year], 55 y.o.   MRN: [id number]  01/02/2016  Follow-up and Medication Refill   HPI This 55 y.o. female presents for follow-up:  1. DDD lumbar: s/p evaluation by pain management who recommended follow-up with [facility]; appointment iwht [facility] on 02-02-16.  2.  Seizure: neurologist switched medication but needs switch medications to [facility].  Stopped Depakote; switched to Lamictal.  Had been maintained on Depakote forever.  Daughter cried.  Lamictal causes less liver damage.  Weaning Depakote; no seizure.    3. Colon cancer screening:  Missed colonoscopy appointment; missed too many.  Since missed one, physician refusing to perform.    4.  Schizoaffective disorder: has not been going to a psychiatrist in years.  When she returns to [place], PCP will fill medications. Daughter knows when patient is not taking medication.  Daughter does not think that patient can process and understand how it has on body.  This is a bad month for her; this is anniversary; husband died 51 years ago; father did everything for patient.  Now just learning. Co-dependent; usually has a boyfriend.  Just learned to cook and wash clothes.  Married x 16 years; together for 26 years. Never worked.  Husband was diabled Conservation officer, nature.  Pulmonary fibrosis.   Celexa 10mg  daily. Lamictal XR 25mg  daily.  Seroquel 300mg  qhs.   5. Hepatitis C:  bruising a lot. S/p ID consultation x 2.  Has not stopped drinking yet.  Drinking in binges.   Drinks 3 forty ounces per day.  Daughter big on budgeting.  Prefers drinking out of large bottle.  Most days drink beer.    6. HTN: taking amlodipine sometimes.   Review of Systems  Constitutional: Negative for fever, chills, diaphoresis and fatigue.  Eyes: Negative for visual disturbance.  Respiratory: Negative for cough and shortness of breath.   Cardiovascular: Negative for chest pain, palpitations and leg swelling.    Gastrointestinal: Negative for nausea, vomiting, abdominal pain, diarrhea and constipation.  Endocrine: Negative for cold intolerance, heat intolerance, polydipsia, polyphagia and polyuria.  Musculoskeletal: Positive for back pain.  Neurological: Negative for dizziness, tremors, seizures, syncope, facial asymmetry, speech difficulty, weakness, light-headedness, numbness and headaches.  Psychiatric/Behavioral: Negative for suicidal ideas, sleep disturbance and self-injury. The patient is nervous/anxious.     Past Medical History  Diagnosis Date  . Sciatic pain   . Schizoaffective disorder   . Hepatitis C   . Arthritis   . Asthma   . Migraine   . Allergy   . Depression   . Anxiety   . Alopecia   . Substance abuse   . Hypertension     onset age 58.  [name] Shortness of breath dyspnea   . COPD (chronic obstructive pulmonary disease) ([place])   . Schizoaffective disorder ([place])   . GERD (gastroesophageal reflux disease)     o cc- takes OTC if needed.  . Seizures ([place])     onset in childhood.  Generalized tonic-clonic.Last seizure couple of months ago  . Iron deficiency anemia    Past Surgical History  Procedure Laterality Date  . Dilation and curettage of uterus      x2  . Exploratory laparotomy      x3  . Ovarian cyst removal  2008  . Abdominal hysterectomy      ovarian cyst B, cervical dysplasia, fibroids.   Ovaries intact.  . Gynecologic cryosurgery      x3  .  Mandible reconstruction N/A 06/27/2015    Procedure: REMOVAL MAXILLARY PALATAL TORUS.  REMOVAL MANDIBULAR TORUS AND EXOSTOSIS.  ;  Surgeon: Diona Browner, DDS;  Location: [facility];  Service: Oral Surgery;  Laterality: N/A;   Allergies  Allergen Reactions  . Fruit & Vegetable Daily [Nutritional Supplements] Shortness Of Breath    Aloe  . Ibuprofen Other (See Comments)    Stomach upset; However, pt can take Meloxicam without incident and takes this at home.   . Aspirin Rash and Other (See Comments)    Stomach upset    Current Outpatient Prescriptions  Medication Sig Dispense Refill  . albuterol (PROVENTIL HFA;VENTOLIN HFA) 108 (90 BASE) MCG/ACT inhaler Inhale 2 puffs into the lungs every 6 (six) hours as needed for shortness of breath. 1 Inhaler 3  . amLODipine (NORVASC) 5 MG tablet TAKE 1 TABLET (5 MG TOTAL) BY MOUTH DAILY. HIGH BLOOD PRESSURE 30 tablet 2  . budesonide-formoterol (SYMBICORT) 80-4.5 MCG/ACT inhaler Inhale 2 puffs into the lungs daily as needed (for shortness of breath). 1 Inhaler 12  . citalopram (CELEXA) 10 MG tablet Take 1 tablet (10 mg total) by mouth daily. For depression 30 tablet 2  . conjugated estrogens (PREMARIN) vaginal cream Place 1 Applicatorful vaginally daily. 30 g 12  . ferrous sulfate 325 (65 FE) MG tablet Take 1 tablet (325 mg total) by mouth daily with breakfast. 90 tablet 1  . ipratropium-albuterol (DUONEB) 0.5-2.5 (3) MG/3ML SOLN Take 3 mLs by nebulization every 4 (four) hours as needed (shortness of breath). Reported on 12/02/2015    . LamoTRIgine (LAMICTAL XR) 25 MG TB24 tablet take 1 tablet daily for 2 weeks, then increase to 2 tablets daily for 2 weeks, then increase to 4 tablets daily then see me in follow-up 120 tablet 0  . methocarbamol (ROBAXIN) 750 MG tablet Take 1 tablet (750 mg total) by mouth every 8 (eight) hours as needed for muscle spasms. 60 tablet 0  . naproxen (NAPROSYN) 500 MG tablet TAKE 1 TABLET (500 MG TOTAL) BY MOUTH 2 (TWO) TIMES DAILY WITH A MEAL. 180 tablet 1  . oxyCODONE-acetaminophen (PERCOCET/ROXICET) 5-325 MG tablet Take 1 tablet by mouth every 6 (six) hours as needed for severe pain. 60 tablet 0  . QUEtiapine (SEROQUEL) 300 MG tablet Take 1 tablet (300 mg total) by mouth at bedtime. Mood control 30 tablet 2  . divalproex (DEPAKOTE) 500 MG DR tablet Take 1 tablet (500 mg total) by mouth 3 (three) times daily. (Patient not taking: Reported on 01/02/2016) 90 tablet 3   No current facility-administered medications for this visit.   Social History    Social History  . Marital Status: Widowed    Spouse Name: N/A  . Number of Children: 1  . Years of Education: N/A   Occupational History  . disabled     mental illness; seizures   Social History Main Topics  . Smoking status: Current Every Day Smoker -- 0.33 packs/day for 44 years    Types: Cigarettes  . Smokeless tobacco: Never Used  . Alcohol Use: 8.4 oz/week    14 Cans of beer per week     Comment: daily alcohol  . Drug Use: No     Comment: 06/26/15- dfenies  . Sexual Activity: Yes    Birth Control/ Protection: None     Comment: widow   Other Topics Concern  . Not on file   Social History Narrative   Marital status:  Widowed since 2002.  Married x 16 years. + dating.  Moved from [place] to live with daughter in 2013.     Children:  One child/daughter (33); two grandchildren.      Lives: with daughter, grandchildren 2.  Does not drive due to epilepsy.      Employment:  Disability for schizoaffective disorder.      Tobacco: 1 ppd x since 8th grade.      Alcohol:  Social; rare drinking due to seizure medications.  Weekends.       Drugs: none; previous use of marijuana.  Previous iv drug use, cocaine.      Exercise: none      Seatbelt:  100%      Guns: none   Family History  Problem Relation Age of Onset  . Diabetes type II Mother   . Hypertension Mother   . Arthritis Mother   . Diabetes Mother   . Mental illness Mother     bipolar  . Diabetes type II Maternal Aunt   . Cancer Maternal Uncle   . Hypertension Father   . Heart murmur Father   . Arthritis Father   . Stroke Father   . Alopecia Sister   . Alopecia Brother   . Alopecia Sister   . Mental retardation Sister     depression       Objective:    BP 133/86 mmHg  Pulse 74  Temp(Src) 98.5 F (36.9 C) (Oral)  Resp 16  Ht 5\' 4"  (1.626 m)  Wt 120 lb (54.432 kg)  BMI 20.59 kg/m2  SpO2 98% Physical Exam  Constitutional: She is oriented to person, place, and time. She appears well-developed  and well-nourished. No distress.  HENT:  Head: Normocephalic and atraumatic.  Right Ear: External ear normal.  Left Ear: External ear normal.  Nose: Nose normal.  Mouth/Throat: Oropharynx is clear and moist.  Eyes: Conjunctivae and EOM are normal. Pupils are equal, round, and reactive to light.  Neck: Normal range of motion. Neck supple. Carotid bruit is not present. No thyromegaly present.  Cardiovascular: Normal rate, regular rhythm, normal heart sounds and intact distal pulses.  Exam reveals no gallop and no friction rub.   No murmur heard. Pulmonary/Chest: Effort normal and breath sounds normal. She has no wheezes. She has no rales.  Abdominal: Soft. Bowel sounds are normal. She exhibits no distension and no mass. There is no tenderness. There is no rebound and no guarding.  Lymphadenopathy:    She has no cervical adenopathy.  Neurological: She is alert and oriented to person, place, and time. No cranial nerve deficit.  Skin: Skin is warm and dry. No rash noted. She is not diaphoretic. No erythema. No pallor.  Psychiatric: She has a normal mood and affect. Her behavior is normal.   Results for orders placed or performed in visit on 10/29/15  Valproic Acid level  Result Value Ref Range   Valproic Acid Lvl 120.2 (H) 50.0 - 100.0 ug/mL  BUN  Result Value Ref Range   BUN 11 7 - 25 mg/dL  Creatinine  Result Value Ref Range   Creat 0.87 0.50 - 1.05 mg/dL       Assessment & Plan:   1. Right-sided low back pain with right-sided sciatica   2. Pain of back and right lower extremity   3. Substance abuse   4. Essential hypertension   5. Seizures ([place])   6. Schizoaffective disorder, bipolar type ([place])   7. Alcohol abuse   8. Chronic hepatitis C without hepatic coma (HCC)  No orders of the defined types were placed in this encounter.   Meds ordered this encounter  Medications  . methocarbamol (ROBAXIN) 750 MG tablet    Sig: Take 1 tablet (750 mg total) by mouth every 8  (eight) hours as needed for muscle spasms.    Dispense:  60 tablet    Refill:  0  . oxyCODONE-acetaminophen (PERCOCET/ROXICET) 5-325 MG tablet    Sig: Take 1 tablet by mouth every 6 (six) hours as needed for severe pain.    Dispense:  60 tablet    Refill:  0    Return in about 3 months (around 04/01/2016) for recheck blood pressure, mood.Norwood Levo, M.D. [facility] [address] [place], [place]  [address] [phone] phone [phone] fax

## 2016-01-12 ENCOUNTER — Telehealth: Payer: Self-pay

## 2016-01-12 NOTE — Telephone Encounter (Signed)
[facility] faxed order for respiratory supplies stating pt placed order and they need signature from MD. Placed in Dr Thompson Caul box for review.

## 2016-01-21 ENCOUNTER — Ambulatory Visit: Payer: Self-pay | Admitting: Neurology

## 2016-01-22 ENCOUNTER — Telehealth: Payer: Self-pay | Admitting: Family Medicine

## 2016-01-22 NOTE — Telephone Encounter (Signed)
Left msg on vm to rtn my call. Need to speak with her about her medication. Her insurance wouldn't cover Lamotrigine ER. She needs to be switched to Lamotrigine 50 mg: Take 1 tablet bid x 2 weeks, then increase to 2 tablets bid & continue.

## 2016-01-27 DIAGNOSIS — H25013 Cortical age-related cataract, bilateral: Secondary | ICD-10-CM | POA: Diagnosis not present

## 2016-01-27 DIAGNOSIS — H40053 Ocular hypertension, bilateral: Secondary | ICD-10-CM | POA: Diagnosis not present

## 2016-01-27 DIAGNOSIS — H2513 Age-related nuclear cataract, bilateral: Secondary | ICD-10-CM | POA: Diagnosis not present

## 2016-01-27 DIAGNOSIS — H40013 Open angle with borderline findings, low risk, bilateral: Secondary | ICD-10-CM | POA: Diagnosis not present

## 2016-01-29 NOTE — Telephone Encounter (Signed)
Lmovm to rtn my call. 

## 2016-02-02 NOTE — Telephone Encounter (Signed)
Will send patient a letter, since no return call.

## 2016-02-23 ENCOUNTER — Ambulatory Visit (INDEPENDENT_AMBULATORY_CARE_PROVIDER_SITE_OTHER): Payer: Medicare Other | Admitting: Physician Assistant

## 2016-02-23 VITALS — BP 124/80 | HR 85 | Temp 98.1°F | Resp 20 | Ht 63.5 in | Wt 116.0 lb

## 2016-02-23 DIAGNOSIS — R059 Cough, unspecified: Secondary | ICD-10-CM

## 2016-02-23 DIAGNOSIS — Z113 Encounter for screening for infections with a predominantly sexual mode of transmission: Secondary | ICD-10-CM

## 2016-02-23 DIAGNOSIS — R1030 Lower abdominal pain, unspecified: Secondary | ICD-10-CM

## 2016-02-23 DIAGNOSIS — R05 Cough: Secondary | ICD-10-CM

## 2016-02-23 DIAGNOSIS — B001 Herpesviral vesicular dermatitis: Secondary | ICD-10-CM | POA: Diagnosis not present

## 2016-02-23 DIAGNOSIS — Z114 Encounter for screening for human immunodeficiency virus [HIV]: Secondary | ICD-10-CM

## 2016-02-23 DIAGNOSIS — F1010 Alcohol abuse, uncomplicated: Secondary | ICD-10-CM

## 2016-02-23 DIAGNOSIS — F1910 Other psychoactive substance abuse, uncomplicated: Secondary | ICD-10-CM

## 2016-02-23 DIAGNOSIS — B182 Chronic viral hepatitis C: Secondary | ICD-10-CM

## 2016-02-23 LAB — POCT CBC
Granulocyte percent: 70.9 %G (ref 37–80)
HCT, POC: 36.4 % — AB (ref 37.7–47.9)
HEMOGLOBIN: 12.8 g/dL (ref 12.2–16.2)
Lymph, poc: 2.2 (ref 0.6–3.4)
MCH, POC: 34 pg — AB (ref 27–31.2)
MCHC: 35 g/dL (ref 31.8–35.4)
MCV: 97.1 fL — AB (ref 80–97)
MID (cbc): 0.5 (ref 0–0.9)
MPV: 7.7 fL (ref 0–99.8)
PLATELET COUNT, POC: 175 10*3/uL (ref 142–424)
POC Granulocyte: 6.5 (ref 2–6.9)
POC LYMPH PERCENT: 24.1 %L (ref 10–50)
POC MID %: 5 %M (ref 0–12)
RBC: 3.75 M/uL — AB (ref 4.04–5.48)
RDW, POC: 14.6 %
WBC: 9.2 10*3/uL (ref 4.6–10.2)

## 2016-02-23 LAB — POCT URINALYSIS DIP (MANUAL ENTRY)
BILIRUBIN UA: NEGATIVE
Glucose, UA: NEGATIVE
LEUKOCYTES UA: NEGATIVE
Nitrite, UA: NEGATIVE
Protein Ur, POC: 30 — AB
Spec Grav, UA: 1.02
Urobilinogen, UA: 1
pH, UA: 6

## 2016-02-23 LAB — POC MICROSCOPIC URINALYSIS (UMFC): MUCUS RE: ABSENT

## 2016-02-23 MED ORDER — VALACYCLOVIR HCL 1 G PO TABS: ORAL_TABLET | ORAL | Status: DC

## 2016-02-23 MED ORDER — PROMETHAZINE-DM 6.25-15 MG/5ML PO SYRP: 5.0000 mL | ORAL_SOLUTION | Freq: Four times a day (QID) | ORAL | Status: DC | PRN

## 2016-02-23 MED ORDER — BENZONATATE 100 MG PO CAPS: 100.0000 mg | ORAL_CAPSULE | Freq: Three times a day (TID) | ORAL | Status: DC | PRN

## 2016-02-23 NOTE — Patient Instructions (Addendum)
Drink plenty of water (64 oz/day) and get plenty of rest. If you have been prescribed a cough syrup, do not drive or operate heavy machinery while using this medication. Take tessalon during the day for cough. Stop taking home mucinex. If your symptoms are not improving in 1 week, return to clinic.  I will call you with your lab results.  Valtrex 2 tabs once, then repeat 2 tabs 12 hours later.    IF you received an x-ray today, you will receive an invoice from [facility]. Please contact [facility] at [phone] with questions or concerns regarding your invoice.   IF you received labwork today, you will receive an invoice from [employer]. Please contact [facility] at [phone] with questions or concerns regarding your invoice.   Our billing staff will not be able to assist you with questions regarding bills from these companies.  You will be contacted with the lab results as soon as they are available. The fastest way to get your results is to activate your My Chart account. Instructions are located on the last page of this paperwork. If you have not heard from [place] regarding the results in 2 weeks, please contact this office.

## 2016-02-23 NOTE — Progress Notes (Signed)
[facility] [address] [place] [address] [phone]  Date:  02/23/2016   Name:  Melissa Bridges   DOB:  Aug 13, 1961   MRN:  [id number]  PCP:  Reginia Forts, MD    Chief Complaint: Abdominal Pain; Cough; Fever; and Sore Throat   History of Present Illness:  This is a 55 y.o. female with PMH seizure disorder, substance abuse, asthma, chronic hep c, HTN, tobacco abuse who is presenting with cough, sore throat and fever. Cough is dry. Having chest tightness. Using albuterol 4 times a day right now. Had fever the past few days. States she checked one day and 101. Another time it was 100. Also having left Bridges pain. She is feeling better today and no more fevers. Taking mucinex, aleve, vit C and not helping at all.  She is also wanting STD testing today. Was sexually active for the first time in 6 months. Condom broke. Having a little pain at lower abdomen, otherwise no symptoms. No vaginal discharge or dysuria.   She is needing refill of valtrex. She states she feels a cold sore coming on.  Pt states she has not been drinking alcohol and no substance use. States she used to do cocaine.  Review of Systems:  Review of Systems See HPI  Patient Active Problem List   Diagnosis Date Noted  . Localization-related idiopathic epilepsy and epileptic syndromes with seizures of localized onset, not intractable, without status epilepticus ([place]) 10/16/2015  . 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 Elevated LFTs 09/09/2014  . Alcohol abuse 09/06/2014  . Asthma with acute exacerbation 03/09/2013  . Hypertension 03/09/2013  . Lower back pain 03/09/2013  . Chronic hepatitis C without hepatic coma ([place]) 03/08/2013  . Smoker 12/12/2012  . Schizoaffective disorder ([place]) 12/12/2012  . Substance abuse 11/14/2011    Prior to Admission medications   Medication Sig Start Date End Date Taking? Authorizing Provider  albuterol (PROVENTIL HFA;VENTOLIN HFA) 108 (90 BASE) MCG/ACT  inhaler Inhale 2 puffs into the lungs every 6 (six) hours as needed for shortness of breath. 09/29/15  yes Wardell Honour, MD  amLODipine (NORVASC) 5 MG tablet TAKE 1 TABLET (5 MG TOTAL) BY MOUTH DAILY. HIGH BLOOD PRESSURE 10/14/15  yes Wardell Honour, MD  budesonide-formoterol [facility]) 80-4.5 MCG/ACT inhaler Inhale 2 puffs into the lungs daily as needed (for shortness of breath). 09/29/15  yes Wardell Honour, MD  citalopram (CELEXA) 10 MG tablet Take 1 tablet (10 mg total) by mouth daily. For depression 12/21/14  yes Posey Boyer, MD  conjugated estrogens (PREMARIN) vaginal cream Place 1 Applicatorful vaginally daily. 09/29/15  prn Wardell Honour, MD  ferrous sulfate 325 (65 FE) MG tablet Take 1 tablet (325 mg total) by mouth daily with breakfast. 09/29/15  yes Wardell Honour, MD  ipratropium-albuterol (DUONEB) 0.5-2.5 (3) MG/3ML SOLN Take 3 mLs by nebulization every 4 (four) hours as needed (shortness of breath). Reported on 12/02/2015   yes Historical Provider, MD  LamoTRIgine (LAMICTAL XR) 25 MG TB24 tablet take 1 tablet daily for 2 weeks, then increase to 2 tablets daily for 2 weeks, then increase to 4 tablets daily then see me in follow-up 12/02/15  yes Cameron Sprang, MD  methocarbamol (ROBAXIN) 750 MG tablet Take 1 tablet (750 mg total) by mouth every 8 (eight) hours as needed for muscle spasms. 01/02/16  yes Wardell Honour, MD  naproxen (NAPROSYN) 500 MG tablet TAKE 1 TABLET (500 MG TOTAL) BY MOUTH 2 (TWO)  TIMES DAILY WITH A MEAL. 10/31/15  yes Darreld Mclean, MD  oxyCODONE-acetaminophen (PERCOCET/ROXICET) 5-325 MG tablet Take 1 tablet by mouth every 6 (six) hours as needed for severe pain. 01/02/16  yes Wardell Honour, MD  QUEtiapine (SEROQUEL) 300 MG tablet Take 1 tablet (300 mg total) by mouth at bedtime. Mood control 08/08/15  yes Darreld Mclean, MD    Allergies  Allergen Reactions  . Fruit & Vegetable Daily [Nutritional Supplements] Shortness Of Breath    Aloe  . Ibuprofen Other  (See Comments)    Stomach upset; However, pt can take Meloxicam without incident and takes this at home.   . Aspirin Rash and Other (See Comments)    Stomach upset    Past Surgical History  Procedure Laterality Date  . Dilation and curettage of uterus      x2  . Exploratory laparotomy      x3  . Ovarian cyst removal  2008  . Abdominal hysterectomy      ovarian cyst B, cervical dysplasia, fibroids.   Ovaries intact.  . Gynecologic cryosurgery      x3  . Mandible reconstruction N/A 06/27/2015    Procedure: REMOVAL MAXILLARY PALATAL TORUS.  REMOVAL MANDIBULAR TORUS AND EXOSTOSIS.  ;  Surgeon: Diona Browner, DDS;  Location: [facility];  Service: Oral Surgery;  Laterality: N/A;    Social History  Substance Use Topics  . Smoking status: Current Every Day Smoker -- 0.33 packs/day for 44 years    Types: Cigarettes  . Smokeless tobacco: Never Used  . Alcohol Use: 8.4 oz/week    14 Cans of beer per week     Comment: daily alcohol    Family History  Problem Relation Age of Onset  . Diabetes type II Mother   . Hypertension Mother   . Arthritis Mother   . Diabetes Mother   . Mental illness Mother     bipolar  . Diabetes type II Maternal Aunt   . Cancer Maternal Uncle   . Hypertension Father   . Heart murmur Father   . Arthritis Father   . Stroke Father   . Alopecia Sister   . Alopecia Brother   . Alopecia Sister   . Mental retardation Sister     depression    Medication list has been reviewed and updated.  Physical Examination:  Physical Exam  Constitutional: She is oriented to person, place, and time. She appears well-developed and well-nourished. No distress.  HENT:  Head: Normocephalic and atraumatic.  Right Bridges: Hearing, tympanic membrane, external Bridges and Bridges canal normal.  Left Bridges: Hearing, tympanic membrane, external Bridges and Bridges canal normal.  Nose: Nose normal. Right sinus exhibits no maxillary sinus tenderness and no frontal sinus tenderness. Left sinus exhibits no  maxillary sinus tenderness and no frontal sinus tenderness.  Mouth/Throat: Uvula is midline, oropharynx is clear and moist and mucous membranes are normal.  Eyes: Conjunctivae and lids are normal. Right eye exhibits no discharge. Left eye exhibits no discharge. No scleral icterus.  Cardiovascular: Normal rate, regular rhythm, normal heart sounds and normal pulses.   No murmur heard. Pulmonary/Chest: Effort normal and breath sounds normal. No respiratory distress. She has no wheezes. She has no rhonchi. She has no rales.  Musculoskeletal: Normal range of motion.  Lymphadenopathy:       Head (right side): No submental, no submandibular and no tonsillar adenopathy present.       Head (left side): No submental, no submandibular and no tonsillar adenopathy  present.    She has no cervical adenopathy.  Neurological: She is alert and oriented to person, place, and time.  Skin: Skin is warm, dry and intact. No lesion and no rash noted.  Psychiatric: She has a normal mood and affect. Her speech is normal. Thought content normal.  Pt appears restless, moving constantly.   BP 124/80 mmHg  Pulse 85  Temp(Src) 98.1 F (36.7 C)  Resp 20  Ht 5' 3.5" (1.613 m)  Wt 116 lb (52.617 kg)  BMI 20.22 kg/m2  SpO2 98%  Results for orders placed or performed in visit on 02/23/16  POCT CBC  Result Value Ref Range   WBC 9.2 4.6 - 10.2 K/uL   Lymph, poc 2.2 0.6 - 3.4   POC LYMPH PERCENT 24.1 10 - 50 %L   MID (cbc) 0.5 0 - 0.9   POC MID % 5.0 0 - 12 %M   POC Granulocyte 6.5 2 - 6.9   Granulocyte percent 70.9 37 - 80 %G   RBC 3.75 (A) 4.04 - 5.48 M/uL   Hemoglobin 12.8 12.2 - 16.2 g/dL   HCT, POC 36.4 (A) 37.7 - 47.9 %   MCV 97.1 (A) 80 - 97 fL   MCH, POC 34.0 (A) 27 - 31.2 pg   MCHC 35.0 31.8 - 35.4 g/dL   RDW, POC 14.6 %   Platelet Count, POC 175 142 - 424 K/uL   MPV 7.7 0 - 99.8 fL  POCT urinalysis dipstick  Result Value Ref Range   Color, UA yellow yellow   Clarity, UA clear clear   Glucose, UA  negative negative   Bilirubin, UA negative negative   Ketones, POC UA trace (5) (A) negative   Spec Grav, UA 1.020    Blood, UA small (A) negative   pH, UA 6.0    Protein Ur, POC =30 (A) negative   Urobilinogen, UA 1.0    Nitrite, UA Negative Negative   Leukocytes, UA Negative Negative  POCT Microscopic Urinalysis ([facility])  Result Value Ref Range   WBC,UR,HPF,POC None None WBC/hpf   RBC,UR,HPF,POC Few (A) None RBC/hpf   Bacteria None None, Too numerous to count   Mucus Absent Absent   Epithelial Cells, UR Per Microscopy Few (A) None, Too numerous to count cells/hpf    Assessment and Plan:  1. Cough Suspect viral URI, possible flu. Symptoms are improving at this point and no longer with fevers, low utility is testing for flu. Treat symptomatically, see rx below. Return in 1 week if symptoms do not improve or at any time if symptoms worsen.  - POCT CBC - promethazine-dextromethorphan (PROMETHAZINE-DM) 6.25-15 MG/5ML syrup; Take 5 mLs by mouth 4 (four) times daily as needed for cough.  Dispense: 118 mL; Refill: 0 - benzonatate (TESSALON) 100 MG capsule; Take 1-2 capsules (100-200 mg total) by mouth 3 (three) times daily as needed for cough.  Dispense: 40 capsule; Refill: 0  2. Screen for STD (sexually transmitted disease) Insurance would not cover HIV for some reason so omitted. - GC/Chlamydia Probe Amp - RPR - Trichomonas vaginalis, RNA  3. Lower abdominal pain - POCT urinalysis dipstick - POCT Microscopic Urinalysis ([facility])  4. Alcohol abuse 5. Substance abuse  Pt denies recent use, however pt appears very restless in room. Suspect she is using (cocaine was/is her drug of choice).  5. Herpes labialis - valACYclovir (VALTREX) 1000 MG tablet; Take 2 tabs po at symptom onset, repeat in 12 hours  Dispense: 20 tablet; Refill: 0  Benjaman Pott Drenda Freeze, MHS [facility]  03/04/2016

## 2016-02-25 LAB — GC/CHLAMYDIA PROBE AMP
CT Probe RNA: NOT DETECTED
GC PROBE AMP APTIMA: NOT DETECTED

## 2016-02-25 LAB — TRICHOMONAS VAGINALIS, PROBE AMP: TRICHOMONAS VAGINALIS PROBE APTIMA: NEGATIVE

## 2016-02-25 LAB — RPR

## 2016-02-26 ENCOUNTER — Telehealth: Payer: Self-pay

## 2016-02-26 ENCOUNTER — Ambulatory Visit (HOSPITAL_COMMUNITY): Payer: Self-pay | Admitting: Psychiatry

## 2016-02-26 NOTE — Telephone Encounter (Signed)
PA is needed for methocarbamol. Completed on covermymeds. Pending. Pt has been using this since at least 11/16/11 w/good effectiveness and no adverse SEs. She has tried meloxicam, naproxen, tizanidine, and flexeril in the past.

## 2016-02-27 NOTE — Telephone Encounter (Signed)
PA was denied because use for pt's Dxs (muscle spasms of back M62.830, as well as M54.41, M54.9, M79.604, and M51.26) are not approved by FDA and are considered off label use. Notified pharm.

## 2016-02-29 ENCOUNTER — Telehealth: Payer: Self-pay | Admitting: *Deleted

## 2016-02-29 NOTE — Telephone Encounter (Signed)
Advised all results negative

## 2016-03-11 DIAGNOSIS — H40023 Open angle with borderline findings, high risk, bilateral: Secondary | ICD-10-CM | POA: Diagnosis not present

## 2016-04-01 ENCOUNTER — Ambulatory Visit (INDEPENDENT_AMBULATORY_CARE_PROVIDER_SITE_OTHER): Payer: Medicare Other | Admitting: Family Medicine

## 2016-04-01 VITALS — BP 177/96 | HR 92 | Temp 97.9°F | Resp 20 | Ht 64.0 in | Wt 122.0 lb

## 2016-04-01 DIAGNOSIS — G40009 Localization-related (focal) (partial) idiopathic epilepsy and epileptic syndromes with seizures of localized onset, not intractable, without status epilepticus: Secondary | ICD-10-CM | POA: Diagnosis not present

## 2016-04-01 DIAGNOSIS — M5441 Lumbago with sciatica, right side: Secondary | ICD-10-CM | POA: Diagnosis not present

## 2016-04-01 DIAGNOSIS — F250 Schizoaffective disorder, bipolar type: Secondary | ICD-10-CM | POA: Diagnosis not present

## 2016-04-01 DIAGNOSIS — B182 Chronic viral hepatitis C: Secondary | ICD-10-CM | POA: Diagnosis not present

## 2016-04-01 DIAGNOSIS — Z1211 Encounter for screening for malignant neoplasm of colon: Secondary | ICD-10-CM | POA: Diagnosis not present

## 2016-04-01 DIAGNOSIS — F259 Schizoaffective disorder, unspecified: Secondary | ICD-10-CM | POA: Diagnosis not present

## 2016-04-01 DIAGNOSIS — I10 Essential (primary) hypertension: Secondary | ICD-10-CM | POA: Diagnosis not present

## 2016-04-01 DIAGNOSIS — M79604 Pain in right leg: Secondary | ICD-10-CM | POA: Diagnosis not present

## 2016-04-01 DIAGNOSIS — F1010 Alcohol abuse, uncomplicated: Secondary | ICD-10-CM | POA: Diagnosis not present

## 2016-04-01 DIAGNOSIS — F1910 Other psychoactive substance abuse, uncomplicated: Secondary | ICD-10-CM

## 2016-04-01 DIAGNOSIS — M549 Dorsalgia, unspecified: Secondary | ICD-10-CM | POA: Diagnosis not present

## 2016-04-01 DIAGNOSIS — M5126 Other intervertebral disc displacement, lumbar region: Secondary | ICD-10-CM

## 2016-04-01 LAB — CBC WITH DIFFERENTIAL/PLATELET
BASOS ABS: 0 {cells}/uL (ref 0–200)
BASOS PCT: 0 %
EOS ABS: 176 {cells}/uL (ref 15–500)
EOS PCT: 2 %
HCT: 43 % (ref 35.0–45.0)
HEMOGLOBIN: 14.3 g/dL (ref 11.7–15.5)
Lymphocytes Relative: 21 %
Lymphs Abs: 1848 cells/uL (ref 850–3900)
MCH: 33.5 pg — AB (ref 27.0–33.0)
MCHC: 33.3 g/dL (ref 32.0–36.0)
MCV: 100.7 fL — AB (ref 80.0–100.0)
MONOS PCT: 9 %
MPV: 10.5 fL (ref 7.5–12.5)
Monocytes Absolute: 792 cells/uL (ref 200–950)
NEUTROS ABS: 5984 {cells}/uL (ref 1500–7800)
Neutrophils Relative %: 68 %
PLATELETS: 252 10*3/uL (ref 140–400)
RBC: 4.27 MIL/uL (ref 3.80–5.10)
RDW: 16.3 % — ABNORMAL HIGH (ref 11.0–15.0)
WBC: 8.8 10*3/uL (ref 3.8–10.8)

## 2016-04-01 LAB — COMPREHENSIVE METABOLIC PANEL
ALBUMIN: 4.7 g/dL (ref 3.6–5.1)
ALK PHOS: 74 U/L (ref 33–130)
ALT: 100 U/L — AB (ref 6–29)
AST: 166 U/L — ABNORMAL HIGH (ref 10–35)
BUN: 10 mg/dL (ref 7–25)
CO2: 28 mmol/L (ref 20–31)
CREATININE: 0.88 mg/dL (ref 0.50–1.05)
Calcium: 10 mg/dL (ref 8.6–10.4)
Chloride: 100 mmol/L (ref 98–110)
Glucose, Bld: 104 mg/dL — ABNORMAL HIGH (ref 65–99)
Potassium: 3.9 mmol/L (ref 3.5–5.3)
SODIUM: 138 mmol/L (ref 135–146)
TOTAL PROTEIN: 8.7 g/dL — AB (ref 6.1–8.1)
Total Bilirubin: 0.6 mg/dL (ref 0.2–1.2)

## 2016-04-01 MED ORDER — OXYCODONE-ACETAMINOPHEN 5-325 MG PO TABS: 1.0000 | ORAL_TABLET | Freq: Four times a day (QID) | ORAL | Status: DC | PRN

## 2016-04-01 MED ORDER — METHOCARBAMOL 750 MG PO TABS: 750.0000 mg | ORAL_TABLET | Freq: Three times a day (TID) | ORAL | Status: DC | PRN

## 2016-04-01 NOTE — Patient Instructions (Addendum)
   IF you received an x-ray today, you will receive an invoice from [facility]. Please contact [facility] at [phone] with questions or concerns regarding your invoice.   IF you received labwork today, you will receive an invoice from [facility]. Please contact [facility] at [phone] with questions or concerns regarding your invoice.   Our billing staff will not be able to assist you with questions regarding bills from these companies.  You will be contacted with the lab results as soon as they are available. The fastest way to get your results is to activate your My Chart account. Instructions are located on the last page of this paperwork. If you have not heard from us regarding the results in 2 weeks, please contact this office.      Sciatica With Rehab The sciatic nerve runs from the back down the leg and is responsible for sensation and control of the muscles in the back (posterior) side of the thigh, lower leg, and foot. Sciatica is a condition that is characterized by inflammation of this nerve.  SYMPTOMS   Signs of nerve damage, including numbness and/or weakness along the posterior side of the lower extremity.  Pain in the back of the thigh that may also travel down the leg.  Pain that worsens when sitting for long periods of time.  Occasionally, pain in the back or buttock. CAUSES  Inflammation of the sciatic nerve is the cause of sciatica. The inflammation is due to something irritating the nerve. Common sources of irritation include:  Sitting for long periods of time.  Direct trauma to the nerve.  Arthritis of the spine.  Herniated or ruptured disk.  Slipping of the vertebrae (spondylolisthesis).  Pressure from soft tissues, such as muscles or ligament-like tissue (fascia). RISK INCREASES WITH:  Sports that place pressure or stress on the spine (football or weightlifting).  Poor strength and  flexibility.  Failure to warm up properly before activity.  Family history of low back pain or disk disorders.  Previous back injury or surgery.  Poor body mechanics, especially when lifting, or poor posture. PREVENTION   Warm up and stretch properly before activity.  Maintain physical fitness:  Strength, flexibility, and endurance.  Cardiovascular fitness.  Learn and use proper technique, especially with posture and lifting. When possible, have coach correct improper technique.  Avoid activities that place stress on the spine. PROGNOSIS If treated properly, then sciatica usually resolves within 6 weeks. However, occasionally surgery is necessary.  RELATED COMPLICATIONS   Permanent nerve damage, including pain, numbness, tingle, or weakness.  Chronic back pain.  Risks of surgery: infection, bleeding, nerve damage, or damage to surrounding tissues. TREATMENT Treatment initially involves resting from any activities that aggravate your symptoms. The use of ice and medication may help reduce pain and inflammation. The use of strengthening and stretching exercises may help reduce pain with activity. These exercises may be performed at home or with referral to a therapist. A therapist may recommend further treatments, such as transcutaneous electronic nerve stimulation (TENS) or ultrasound. Your caregiver may recommend corticosteroid injections to help reduce inflammation of the sciatic nerve. If symptoms persist despite non-surgical (conservative) treatment, then surgery may be recommended. MEDICATION  If pain medication is necessary, then nonsteroidal anti-inflammatory medications, such as aspirin and ibuprofen, or other minor pain relievers, such as acetaminophen, are often recommended.  Do not take pain medication for 7 days before surgery.  Prescription pain relievers may be given if deemed necessary by your   caregiver. Use only as directed and only as much as you  need.  Ointments applied to the skin may be helpful.  Corticosteroid injections may be given by your caregiver. These injections should be reserved for the most serious cases, because they may only be given a certain number of times. HEAT AND COLD  Cold treatment (icing) relieves pain and reduces inflammation. Cold treatment should be applied for 10 to 15 minutes every 2 to 3 hours for inflammation and pain and immediately after any activity that aggravates your symptoms. Use ice packs or massage the area with a piece of ice (ice massage).  Heat treatment may be used prior to performing the stretching and strengthening activities prescribed by your caregiver, physical therapist, or athletic trainer. Use a heat pack or soak the injury in warm water. SEEK MEDICAL CARE IF:  Treatment seems to offer no benefit, or the condition worsens.  Any medications produce adverse side effects. EXERCISES  RANGE OF MOTION (ROM) AND STRETCHING EXERCISES - Sciatica Most people with sciatic will find that their symptoms worsen with either excessive bending forward (flexion) or arching at the low back (extension). The exercises which will help resolve your symptoms will focus on the opposite motion. Your physician, physical therapist or athletic trainer will help you determine which exercises will be most helpful to resolve your low back pain. Do not complete any exercises without first consulting with your clinician. Discontinue any exercises which worsen your symptoms until you speak to your clinician. If you have pain, numbness or tingling which travels down into your buttocks, leg or foot, the goal of the therapy is for these symptoms to move closer to your back and eventually resolve. Occasionally, these leg symptoms will get better, but your low back pain may worsen; this is typically an indication of progress in your rehabilitation. Be certain to be very alert to any changes in your symptoms and the activities in  which you participated in the 24 hours prior to the change. Sharing this information with your clinician will allow him/her to most efficiently treat your condition. These exercises may help you when beginning to rehabilitate your injury. Your symptoms may resolve with or without further involvement from your physician, physical therapist or athletic trainer. While completing these exercises, remember:   Restoring tissue flexibility helps normal motion to return to the joints. This allows healthier, less painful movement and activity.  An effective stretch should be held for at least 30 seconds.  A stretch should never be painful. You should only feel a gentle lengthening or release in the stretched tissue. FLEXION RANGE OF MOTION AND STRETCHING EXERCISES: STRETCH - Flexion, Single Knee to Chest   Lie on a firm bed or floor with both legs extended in front of you.  Keeping one leg in contact with the floor, bring your opposite knee to your chest. Hold your leg in place by either grabbing behind your thigh or at your knee.  Pull until you feel a gentle stretch in your low back. Hold __________ seconds.  Slowly release your grasp and repeat the exercise with the opposite side. Repeat __________ times. Complete this exercise __________ times per day.  STRETCH - Flexion, Double Knee to Chest  Lie on a firm bed or floor with both legs extended in front of you.  Keeping one leg in contact with the floor, bring your opposite knee to your chest.  Tense your stomach muscles to support your back and then lift your other knee to   your chest. Hold your legs in place by either grabbing behind your thighs or at your knees.  Pull both knees toward your chest until you feel a gentle stretch in your low back. Hold __________ seconds.  Tense your stomach muscles and slowly return one leg at a time to the floor. Repeat __________ times. Complete this exercise __________ times per day.  STRETCH - Low Trunk  Rotation   Lie on a firm bed or floor. Keeping your legs in front of you, bend your knees so they are both pointed toward the ceiling and your feet are flat on the floor.  Extend your arms out to the side. This will stabilize your upper body by keeping your shoulders in contact with the floor.  Gently and slowly drop both knees together to one side until you feel a gentle stretch in your low back. Hold for __________ seconds.  Tense your stomach muscles to support your low back as you bring your knees back to the starting position. Repeat the exercise to the other side. Repeat __________ times. Complete this exercise __________ times per day  EXTENSION RANGE OF MOTION AND FLEXIBILITY EXERCISES: STRETCH - Extension, Prone on Elbows  Lie on your stomach on the floor, a bed will be too soft. Place your palms about shoulder width apart and at the height of your head.  Place your elbows under your shoulders. If this is too painful, stack pillows under your chest.  Allow your body to relax so that your hips drop lower and make contact more completely with the floor.  Hold this position for __________ seconds.  Slowly return to lying flat on the floor. Repeat __________ times. Complete this exercise __________ times per day.  RANGE OF MOTION - Extension, Prone Press Ups  Lie on your stomach on the floor, a bed will be too soft. Place your palms about shoulder width apart and at the height of your head.  Keeping your back as relaxed as possible, slowly straighten your elbows while keeping your hips on the floor. You may adjust the placement of your hands to maximize your comfort. As you gain motion, your hands will come more underneath your shoulders.  Hold this position __________ seconds.  Slowly return to lying flat on the floor. Repeat __________ times. Complete this exercise __________ times per day.  STRENGTHENING EXERCISES - Sciatica  These exercises may help you when beginning to  rehabilitate your injury. These exercises should be done near your "sweet spot." This is the neutral, low-back arch, somewhere between fully rounded and fully arched, that is your least painful position. When performed in this safe range of motion, these exercises can be used for people who have either a flexion or extension based injury. These exercises may resolve your symptoms with or without further involvement from your physician, physical therapist or athletic trainer. While completing these exercises, remember:   Muscles can gain both the endurance and the strength needed for everyday activities through controlled exercises.  Complete these exercises as instructed by your physician, physical therapist or athletic trainer. Progress with the resistance and repetition exercises only as your caregiver advises.  You may experience muscle soreness or fatigue, but the pain or discomfort you are trying to eliminate should never worsen during these exercises. If this pain does worsen, stop and make certain you are following the directions exactly. If the pain is still present after adjustments, discontinue the exercise until you can discuss the trouble with your clinician. STRENGTHENING - Deep   Abdominals, Pelvic Tilt   Lie on a firm bed or floor. Keeping your legs in front of you, bend your knees so they are both pointed toward the ceiling and your feet are flat on the floor.  Tense your lower abdominal muscles to press your low back into the floor. This motion will rotate your pelvis so that your tail bone is scooping upwards rather than pointing at your feet or into the floor.  With a gentle tension and even breathing, hold this position for __________ seconds. Repeat __________ times. Complete this exercise __________ times per day.  STRENGTHENING - Abdominals, Crunches   Lie on a firm bed or floor. Keeping your legs in front of you, bend your knees so they are both pointed toward the ceiling and  your feet are flat on the floor. Cross your arms over your chest.  Slightly tip your chin down without bending your neck.  Tense your abdominals and slowly lift your trunk high enough to just clear your shoulder blades. Lifting higher can put excessive stress on the low back and does not further strengthen your abdominal muscles.  Control your return to the starting position. Repeat __________ times. Complete this exercise __________ times per day.  STRENGTHENING - Quadruped, Opposite UE/LE Lift  Assume a hands and knees position on a firm surface. Keep your hands under your shoulders and your knees under your hips. You may place padding under your knees for comfort.  Find your neutral spine and gently tense your abdominal muscles so that you can maintain this position. Your shoulders and hips should form a rectangle that is parallel with the floor and is not twisted.  Keeping your trunk steady, lift your right hand no higher than your shoulder and then your left leg no higher than your hip. Make sure you are not holding your breath. Hold this position __________ seconds.  Continuing to keep your abdominal muscles tense and your back steady, slowly return to your starting position. Repeat with the opposite arm and leg. Repeat __________ times. Complete this exercise __________ times per day.  STRENGTHENING - Abdominals and Quadriceps, Straight Leg Raise   Lie on a firm bed or floor with both legs extended in front of you.  Keeping one leg in contact with the floor, bend the other knee so that your foot can rest flat on the floor.  Find your neutral spine, and tense your abdominal muscles to maintain your spinal position throughout the exercise.  Slowly lift your straight leg off the floor about 6 inches for a count of 15, making sure to not hold your breath.  Still keeping your neutral spine, slowly lower your leg all the way to the floor. Repeat this exercise with each leg __________  times. Complete this exercise __________ times per day. POSTURE AND BODY MECHANICS CONSIDERATIONS - Sciatica Keeping correct posture when sitting, standing or completing your activities will reduce the stress put on different body tissues, allowing injured tissues a chance to heal and limiting painful experiences. The following are general guidelines for improved posture. Your physician or physical therapist will provide you with any instructions specific to your needs. While reading these guidelines, remember:  The exercises prescribed by your provider will help you have the flexibility and strength to maintain correct postures.  The correct posture provides the optimal environment for your joints to work. All of your joints have less wear and tear when properly supported by a spine with good posture. This means you will experience   a healthier, less painful body.  Correct posture must be practiced with all of your activities, especially prolonged sitting and standing. Correct posture is as important when doing repetitive low-stress activities (typing) as it is when doing a single heavy-load activity (lifting). RESTING POSITIONS Consider which positions are most painful for you when choosing a resting position. If you have pain with flexion-based activities (sitting, bending, stooping, squatting), choose a position that allows you to rest in a less flexed posture. You would want to avoid curling into a fetal position on your side. If your pain worsens with extension-based activities (prolonged standing, working overhead), avoid resting in an extended position such as sleeping on your stomach. Most people will find more comfort when they rest with their spine in a more neutral position, neither too rounded nor too arched. Lying on a non-sagging bed on your side with a pillow between your knees, or on your back with a pillow under your knees will often provide some relief. Keep in mind, being in any one  position for a prolonged period of time, no matter how correct your posture, can still lead to stiffness. PROPER SITTING POSTURE In order to minimize stress and discomfort on your spine, you must sit with correct posture Sitting with good posture should be effortless for a healthy body. Returning to good posture is a gradual process. Many people can work toward this most comfortably by using various supports until they have the flexibility and strength to maintain this posture on their own. When sitting with proper posture, your ears will fall over your shoulders and your shoulders will fall over your hips. You should use the back of the chair to support your upper back. Your low back will be in a neutral position, just slightly arched. You may place a small pillow or folded towel at the base of your low back for support.  When working at a desk, create an environment that supports good, upright posture. Without extra support, muscles fatigue and lead to excessive strain on joints and other tissues. Keep these recommendations in mind: CHAIR:   A chair should be able to slide under your desk when your back makes contact with the back of the chair. This allows you to work closely.  The chair's height should allow your eyes to be level with the upper part of your monitor and your hands to be slightly lower than your elbows. BODY POSITION  Your feet should make contact with the floor. If this is not possible, use a foot rest.  Keep your ears over your shoulders. This will reduce stress on your neck and low back. INCORRECT SITTING POSTURES   If you are feeling tired and unable to assume a healthy sitting posture, do not slouch or slump. This puts excessive strain on your back tissues, causing more damage and pain. Healthier options include:  Using more support, like a lumbar pillow.  Switching tasks to something that requires you to be upright or walking.  Talking a brief walk.  Lying down to  rest in a neutral-spine position. PROLONGED STANDING WHILE SLIGHTLY LEANING FORWARD  When completing a task that requires you to lean forward while standing in one place for a long time, place either foot up on a stationary 2-4 inch high object to help maintain the best posture. When both feet are on the ground, the low back tends to lose its slight inward curve. If this curve flattens (or becomes too large), then the back and your other   joints will experience too much stress, fatigue more quickly and can cause pain.  CORRECT STANDING POSTURES Proper standing posture should be assumed with all daily activities, even if they only take a few moments, like when brushing your teeth. As in sitting, your ears should fall over your shoulders and your shoulders should fall over your hips. You should keep a slight tension in your abdominal muscles to brace your spine. Your tailbone should point down to the ground, not behind your body, resulting in an over-extended swayback posture.  INCORRECT STANDING POSTURES  Common incorrect standing postures include a forward head, locked knees and/or an excessive swayback. WALKING Walk with an upright posture. Your ears, shoulders and hips should all line-up. PROLONGED ACTIVITY IN A FLEXED POSITION When completing a task that requires you to bend forward at your waist or lean over a low surface, try to find a way to stabilize 3 of 4 of your limbs. You can place a hand or elbow on your thigh or rest a knee on the surface you are reaching across. This will provide you more stability so that your muscles do not fatigue as quickly. By keeping your knees relaxed, or slightly bent, you will also reduce stress across your low back. CORRECT LIFTING TECHNIQUES DO :   Assume a wide stance. This will provide you more stability and the opportunity to get as close as possible to the object which you are lifting.  Tense your abdominals to brace your spine; then bend at the knees and  hips. Keeping your back locked in a neutral-spine position, lift using your leg muscles. Lift with your legs, keeping your back straight.  Test the weight of unknown objects before attempting to lift them.  Try to keep your elbows locked down at your sides in order get the best strength from your shoulders when carrying an object.  Always ask for help when lifting heavy or awkward objects. INCORRECT LIFTING TECHNIQUES DO NOT:   Lock your knees when lifting, even if it is a small object.  Bend and twist. Pivot at your feet or move your feet when needing to change directions.  Assume that you cannot safely pick up a paperclip without proper posture.   This information is not intended to replace advice given to you by your health care provider. Make sure you discuss any questions you have with your health care provider.   Document Released: 11/29/2005 Document Revised: 04/15/2015 Document Reviewed: 03/13/2009 [employer] Interactive Patient Education 2016 [employer]  

## 2016-04-01 NOTE — Progress Notes (Signed)
Subjective:    Patient ID: Melissa Bridges, female    DOB: May 25, 1961, 55 y.o.   MRN: [id number]  04/01/2016  Back Pain and Leg Pain   HPI This 55 y.o. female presents for evaluation of recurrent/persistent lower back pain.  Lower back was doing well; then moved some things around recently.  Constant lower back pain for three days; intermittent pain for two weeks.  If does not over-exert self, lower back does well.  Lives with messy family members; must clean all the time.  Returns to [place] on Apr 12, 2016.  Kayren Eaves to be scheduled for surgery by [facility]; wants to have surgery for lower back in May.  Moving around furniture/chair.  Had people over and needed more chairs.  R side; radiates into R leg; also having L lower back pain.  Rarely gets L back pain.  Radiates into R knee.  +numbness in R thigh; giving out intermittent.  Normal b/b function; no saddle paresthesias.  Taking Aleve recently; also taking Percocet; ran out of Percocet two weeks ago.  Daughter brought today; had to return home; had three wisdom teeth pulled.  Blood pressure usually goes up when in pain.   Has appointment with psychiatry in May.   No seizure in months with transition to new medication.   Review of Systems  Constitutional: Negative for fever, chills, diaphoresis and fatigue.  Eyes: Negative for visual disturbance.  Respiratory: Negative for cough and shortness of breath.   Cardiovascular: Negative for chest pain, palpitations and leg swelling.  Gastrointestinal: Negative for nausea, vomiting, abdominal pain, diarrhea and constipation.  Endocrine: Negative for cold intolerance, heat intolerance, polydipsia, polyphagia and polyuria.  Genitourinary: Negative for decreased urine volume and difficulty urinating.  Musculoskeletal: Positive for myalgias and back pain.  Neurological: Positive for numbness. Negative for dizziness, tremors, seizures, syncope, facial asymmetry, speech difficulty, weakness,  light-headedness and headaches.    Past Medical History  Diagnosis Date  . Sciatic pain   . Schizoaffective disorder   . Hepatitis C   . Arthritis   . Asthma   . Migraine   . Allergy   . Depression   . Anxiety   . Alopecia   . Substance abuse   . Hypertension     onset age 66.  [name] Shortness of breath dyspnea   . COPD (chronic obstructive pulmonary disease) (HCC)   . Schizoaffective disorder (HCC)   . GERD (gastroesophageal reflux disease)     o cc- takes OTC if needed.  . Seizures (HCC)     onset in childhood.  Generalized tonic-clonic.Last seizure couple of months ago  . Iron deficiency anemia    Past Surgical History  Procedure Laterality Date  . Dilation and curettage of uterus      x2  . Exploratory laparotomy      x3  . Ovarian cyst removal  2008  . Abdominal hysterectomy      ovarian cyst B, cervical dysplasia, fibroids.   Ovaries intact.  . Gynecologic cryosurgery      x3  . Mandible reconstruction N/A 06/27/2015    Procedure: REMOVAL MAXILLARY PALATAL TORUS.  REMOVAL MANDIBULAR TORUS AND EXOSTOSIS.  ;  Surgeon: Ocie Doyne, DDS;  Location: [facility] OR;  Service: Oral Surgery;  Laterality: N/A;   Allergies  Allergen Reactions  . Fruit & Vegetable Daily [Nutritional Supplements] Shortness Of Breath    Aloe  . Ibuprofen Other (See Comments)    Stomach upset; However, pt can take Meloxicam without incident and takes  this at home.   . Aspirin Rash and Other (See Comments)    Stomach upset   Current Outpatient Prescriptions  Medication Sig Dispense Refill  . albuterol (PROVENTIL HFA;VENTOLIN HFA) 108 (90 BASE) MCG/ACT inhaler Inhale 2 puffs into the lungs every 6 (six) hours as needed for shortness of breath. 1 Inhaler 3  . amLODipine (NORVASC) 5 MG tablet TAKE 1 TABLET (5 MG TOTAL) BY MOUTH DAILY. HIGH BLOOD PRESSURE 30 tablet 2  . budesonide-formoterol (SYMBICORT) 80-4.5 MCG/ACT inhaler Inhale 2 puffs into the lungs daily as needed (for shortness of breath). 1  Inhaler 12  . citalopram (CELEXA) 10 MG tablet Take 1 tablet (10 mg total) by mouth daily. For depression 30 tablet 2  . conjugated estrogens (PREMARIN) vaginal cream Place 1 Applicatorful vaginally daily. 30 g 12  . ferrous sulfate 325 (65 FE) MG tablet Take 1 tablet (325 mg total) by mouth daily with breakfast. 90 tablet 1  . ipratropium-albuterol (DUONEB) 0.5-2.5 (3) MG/3ML SOLN Take 3 mLs by nebulization every 4 (four) hours as needed (shortness of breath). Reported on 12/02/2015    . LamoTRIgine (LAMICTAL XR) 25 MG TB24 tablet take 1 tablet daily for 2 weeks, then increase to 2 tablets daily for 2 weeks, then increase to 4 tablets daily then see me in follow-up 120 tablet 0  . methocarbamol (ROBAXIN) 750 MG tablet Take 1 tablet (750 mg total) by mouth every 8 (eight) hours as needed for muscle spasms. 60 tablet 0  . naproxen (NAPROSYN) 500 MG tablet TAKE 1 TABLET (500 MG TOTAL) BY MOUTH 2 (TWO) TIMES DAILY WITH A MEAL. 180 tablet 1  . QUEtiapine (SEROQUEL) 300 MG tablet Take 1 tablet (300 mg total) by mouth at bedtime. Mood control 30 tablet 2  . oxyCODONE-acetaminophen (PERCOCET/ROXICET) 5-325 MG tablet Take 1 tablet by mouth every 6 (six) hours as needed for severe pain. 60 tablet 0  . valACYclovir (VALTREX) 1000 MG tablet Take 2 tabs po at symptom onset, repeat in 12 hours (Patient not taking: Reported on 04/01/2016) 20 tablet 0   No current facility-administered medications for this visit.   Social History   Social History  . Marital Status: Widowed    Spouse Name: N/A  . Number of Children: 1  . Years of Education: N/A   Occupational History  . disabled     mental illness; seizures   Social History Main Topics  . Smoking status: Current Every Day Smoker -- 0.33 packs/day for 44 years    Types: Cigarettes  . Smokeless tobacco: Never Used  . Alcohol Use: 8.4 oz/week    14 Cans of beer per week     Comment: daily alcohol  . Drug Use: No     Comment: 06/26/15- dfenies  . Sexual  Activity: Yes    Birth Control/ Protection: None     Comment: widow   Other Topics Concern  . Not on file   Social History Narrative   Marital status:  Widowed since 2002.  Married x 16 years. + dating.  Moved from [place] to live with daughter in 2013.     Children:  One child/daughter (33); two grandchildren.      Lives: with daughter, grandchildren 2.  Does not drive due to epilepsy.      Employment:  Disability for schizoaffective disorder.      Tobacco: 1 ppd x since 8th grade.      Alcohol:  Social; rare drinking due to seizure medications.  Weekends.  Drugs: none; previous use of marijuana.  Previous iv drug use, cocaine.      Exercise: none      Seatbelt:  100%      Guns: none   Family History  Problem Relation Age of Onset  . Diabetes type II Mother   . Hypertension Mother   . Arthritis Mother   . Diabetes Mother   . Mental illness Mother     bipolar  . Diabetes type II Maternal Aunt   . Cancer Maternal Uncle   . Hypertension Father   . Heart murmur Father   . Arthritis Father   . Stroke Father   . Alopecia Sister   . Alopecia Brother   . Alopecia Sister   . Mental retardation Sister     depression       Objective:    BP 177/96 mmHg  Pulse 92  Temp(Src) 97.9 F (36.6 C)  Resp 20  Ht 5\' 4"  (1.626 m)  Wt 122 lb (55.339 kg)  BMI 20.93 kg/m2 Physical Exam  Constitutional: She is oriented to person, place, and time. She appears well-developed and well-nourished. She appears distressed.  HENT:  Head: Normocephalic and atraumatic.  Eyes: Conjunctivae and EOM are normal. Pupils are equal, round, and reactive to light.  Neck: Normal range of motion. Neck supple. Carotid bruit is not present. No thyromegaly present.  Cardiovascular: Normal rate, regular rhythm, normal heart sounds and intact distal pulses.  Exam reveals no gallop and no friction rub.   No murmur heard. Pulmonary/Chest: Effort normal and breath sounds normal. She has no wheezes. She  has no rales.  Abdominal: Soft. Bowel sounds are normal. She exhibits no distension and no mass. There is no tenderness. There is no rebound and no guarding.  Musculoskeletal:       Lumbar back: She exhibits decreased range of motion, tenderness, pain and spasm. She exhibits no bony tenderness and normal pulse.  Lumbar spine:  Non-tender midline; +tender paraspinal regions R.  Straight leg raises positive B; toe and heel walking intact; marching intact; motor 5/5 BLE.  Decreased ROM lumbar spine without limitation.   Lymphadenopathy:    She has no cervical adenopathy.  Neurological: She is alert and oriented to person, place, and time. No cranial nerve deficit.  Skin: Skin is warm and dry. No rash noted. She is not diaphoretic. No erythema. No pallor.  Psychiatric: She has a normal mood and affect. Her behavior is normal.   Results for orders placed or performed in visit on 02/23/16  GC/Chlamydia Probe Amp  Result Value Ref Range   CT Probe RNA NOT DETECTED    GC Probe RNA NOT DETECTED   Trichomonas vaginalis, RNA  Result Value Ref Range   T vaginalis RNA Negative   RPR  Result Value Ref Range   RPR Ser Ql NON REAC NON REAC  POCT CBC  Result Value Ref Range   WBC 9.2 4.6 - 10.2 K/uL   Lymph, poc 2.2 0.6 - 3.4   POC LYMPH PERCENT 24.1 10 - 50 %L   MID (cbc) 0.5 0 - 0.9   POC MID % 5.0 0 - 12 %M   POC Granulocyte 6.5 2 - 6.9   Granulocyte percent 70.9 37 - 80 %G   RBC 3.75 (A) 4.04 - 5.48 M/uL   Hemoglobin 12.8 12.2 - 16.2 g/dL   HCT, POC 40.9 (A) 81.1 - 47.9 %   MCV 97.1 (A) 80 - 97 fL   MCH, POC  34.0 (A) 27 - 31.2 pg   MCHC 35.0 31.8 - 35.4 g/dL   RDW, POC 62.9 %   Platelet Count, POC 175 142 - 424 K/uL   MPV 7.7 0 - 99.8 fL  POCT urinalysis dipstick  Result Value Ref Range   Color, UA yellow yellow   Clarity, UA clear clear   Glucose, UA negative negative   Bilirubin, UA negative negative   Ketones, POC UA trace (5) (A) negative   Spec Grav, UA 1.020    Blood, UA  small (A) negative   pH, UA 6.0    Protein Ur, POC =30 (A) negative   Urobilinogen, UA 1.0    Nitrite, UA Negative Negative   Leukocytes, UA Negative Negative  POCT Microscopic Urinalysis ([facility])  Result Value Ref Range   WBC,UR,HPF,POC None None WBC/hpf   RBC,UR,HPF,POC Few (A) None RBC/hpf   Bacteria None None, Too numerous to count   Mucus Absent Absent   Epithelial Cells, UR Per Microscopy Few (A) None, Too numerous to count cells/hpf       Assessment & Plan:   1. Right-sided low back pain with right-sided sciatica   2. Colon cancer screening   3. Pain of back and right lower extremity   4. Schizoaffective disorder, bipolar type (HCC)   5. Alcohol abuse   6. Essential hypertension   7. Substance abuse   8. Lumbar disc herniation   9. Chronic hepatitis C without hepatic coma (HCC)   10. Localization-related idiopathic epilepsy and epileptic syndromes with seizures of localized onset, not intractable, without status epilepticus (HCC)    -acute worsening lower back pain after moving furniture. -continue Naproxen bid; refill of Methocarbamol provided; refill of Percocet provided. -appointment in two weeks with Dr. Jena Gauss to discuss surgical correction. -blood pressure uncontrolled due to acute pain. Reports compliance with Amlodipine. -appointment next month with psychiatry; will follow-up with ID after appointment with psychiatry. -refer to GI again for colonoscopy. -obtain labs due to drug therapy monitoring.   Orders Placed This Encounter  Procedures  . CBC with Differential/Platelet  . Comprehensive metabolic panel  . Ambulatory referral to Gastroenterology    Referral Priority:  Routine    Referral Type:  Consultation    Referral Reason:  Specialty Services Required    Number of Visits Requested:  1   Meds ordered this encounter  Medications  . methocarbamol (ROBAXIN) 750 MG tablet    Sig: Take 1 tablet (750 mg total) by mouth every 8 (eight) hours as needed  for muscle spasms.    Dispense:  60 tablet    Refill:  0  . oxyCODONE-acetaminophen (PERCOCET/ROXICET) 5-325 MG tablet    Sig: Take 1 tablet by mouth every 6 (six) hours as needed for severe pain.    Dispense:  60 tablet    Refill:  0    Return if symptoms worsen or fail to improve.    Melissa Bridges, M.D. [facility] [address] [place], [place]  [address] [phone] phone [phone] fax

## 2016-04-06 ENCOUNTER — Encounter: Payer: Self-pay | Admitting: Gastroenterology

## 2016-04-16 ENCOUNTER — Encounter: Payer: Self-pay | Admitting: Family Medicine

## 2016-04-19 ENCOUNTER — Ambulatory Visit (HOSPITAL_COMMUNITY): Payer: Self-pay | Admitting: Psychiatry

## 2016-05-07 ENCOUNTER — Other Ambulatory Visit: Payer: Self-pay | Admitting: Family Medicine

## 2016-05-11 ENCOUNTER — Ambulatory Visit (INDEPENDENT_AMBULATORY_CARE_PROVIDER_SITE_OTHER): Payer: Medicare Other | Admitting: Family Medicine

## 2016-05-11 ENCOUNTER — Ambulatory Visit (INDEPENDENT_AMBULATORY_CARE_PROVIDER_SITE_OTHER): Payer: Medicare Other

## 2016-05-11 ENCOUNTER — Encounter: Payer: Self-pay | Admitting: Family Medicine

## 2016-05-11 VITALS — BP 150/80 | HR 92 | Temp 98.2°F | Resp 16 | Ht 63.5 in | Wt 120.0 lb

## 2016-05-11 DIAGNOSIS — M542 Cervicalgia: Secondary | ICD-10-CM

## 2016-05-11 DIAGNOSIS — M79604 Pain in right leg: Secondary | ICD-10-CM | POA: Diagnosis not present

## 2016-05-11 DIAGNOSIS — M5136 Other intervertebral disc degeneration, lumbar region: Secondary | ICD-10-CM | POA: Diagnosis not present

## 2016-05-11 DIAGNOSIS — B182 Chronic viral hepatitis C: Secondary | ICD-10-CM | POA: Diagnosis not present

## 2016-05-11 DIAGNOSIS — M549 Dorsalgia, unspecified: Secondary | ICD-10-CM

## 2016-05-11 DIAGNOSIS — M5030 Other cervical disc degeneration, unspecified cervical region: Secondary | ICD-10-CM | POA: Diagnosis not present

## 2016-05-11 DIAGNOSIS — Z23 Encounter for immunization: Secondary | ICD-10-CM | POA: Diagnosis not present

## 2016-05-11 DIAGNOSIS — F250 Schizoaffective disorder, bipolar type: Secondary | ICD-10-CM

## 2016-05-11 DIAGNOSIS — R202 Paresthesia of skin: Secondary | ICD-10-CM | POA: Diagnosis not present

## 2016-05-11 DIAGNOSIS — M47812 Spondylosis without myelopathy or radiculopathy, cervical region: Secondary | ICD-10-CM | POA: Diagnosis not present

## 2016-05-11 MED ORDER — OXYCODONE-ACETAMINOPHEN 5-325 MG PO TABS: 1.0000 | ORAL_TABLET | Freq: Four times a day (QID) | ORAL | Status: DC | PRN

## 2016-05-11 NOTE — Patient Instructions (Addendum)
IF you received an x-ray today, you will receive an invoice from [facility]. Please contact [facility] at [phone] with questions or concerns regarding your invoice.   IF you received labwork today, you will receive an invoice from [employer]. Please contact [facility] at [phone] with questions or concerns regarding your invoice.   Our billing staff will not be able to assist you with questions regarding bills from these companies.  You will be contacted with the lab results as soon as they are available. The fastest way to get your results is to activate your My Chart account. Instructions are located on the last page of this paperwork. If you have not heard from [place] regarding the results in 2 weeks, please contact this office.    Carpal Tunnel Release Carpal tunnel release is a surgical procedure to relieve numbness and pain in your hand that are caused by carpal tunnel syndrome. Your carpal tunnel is a narrow, hollow space in your wrist. It passes between your wrist bones and a band of connective tissue (transverse carpal ligament). The nerve that supplies most of your hand (median nerve) passes through this space, and so do the connections between your fingers and the muscles of your arm (tendons). Carpal tunnel syndrome makes this space swell and become narrow, and this causes pain and numbness. In carpal tunnel release surgery, a surgeon cuts through the transverse carpal ligament to make more room in the carpal tunnel space. You may have this surgery if other types of treatment have not worked. LET [facility] CARE PROVIDER KNOW ABOUT:  Any allergies you have.  All medicines you are taking, including vitamins, herbs, eye drops, creams, and over-the-counter medicines.  Previous problems you or members of your family have had with the use of anesthetics.  Any blood disorders you have.  Previous surgeries you have had.  Medical  conditions you have. RISKS AND COMPLICATIONS Generally, this is a safe procedure. However, problems may occur, including:  Bleeding.  Infection.  Injury to the median nerve.  Need for additional surgery. BEFORE THE PROCEDURE  Ask your health care provider about:  Changing or stopping your regular medicines. This is especially important if you are taking diabetes medicines or blood thinners.  Taking medicines such as aspirin and ibuprofen. These medicines can thin your blood. Do not take these medicines before your procedure if your health care provider instructs you not to.  Do not eat or drink anything after midnight on the night before the procedure or as directed by your health care provider.  Plan to have someone take you home after the procedure. PROCEDURE  An IV tube may be inserted into a vein.  You will be given one of the following:  A medicine that numbs the wrist area (local anesthetic). You may also be given a medicine to make you relax (sedative).  A medicine that makes you go to sleep (general anesthetic).  Your arm, hand, and wrist will be cleaned with a germ-killing solution (antiseptic).  Your surgeon will make a surgical cut (incision) over the palm side of your wrist. The surgeon will pull aside the skin of your wrist to expose the carpal tunnel space.  The surgeon will cut the transverse carpal ligament.  The edges of the incision will be closed with stitches (sutures) or staples.  A bandage (dressing) will be placed over your wrist and wrapped around your hand and wrist. AFTER THE PROCEDURE  You may spend some  time in a recovery area.  Your blood pressure, heart rate, breathing rate, and blood oxygen level will be monitored often until the medicines you were given have worn off.  You will likely have some pain. You will be given pain medicine.  You may need to wear a splint or a wrist brace over your dressing.   This information is not intended  to replace advice given to you by your health care provider. Make sure you discuss any questions you have with your health care provider.   Document Released: 02/19/2004 Document Revised: 12/20/2014 Document Reviewed: 07/17/2014 Elsevier Interactive Patient Education [employer].
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
## 2016-05-20 ENCOUNTER — Ambulatory Visit (AMBULATORY_SURGERY_CENTER): Payer: Self-pay

## 2016-05-20 ENCOUNTER — Telehealth: Payer: Self-pay

## 2016-05-20 VITALS — Ht 63.75 in

## 2016-05-20 DIAGNOSIS — Z1211 Encounter for screening for malignant neoplasm of colon: Secondary | ICD-10-CM

## 2016-05-20 MED ORDER — SUPREP BOWEL PREP KIT 17.5-3.13-1.6 GM/177ML PO SOLN: 1.0000 | Freq: Once | ORAL | Status: DC

## 2016-05-20 NOTE — Progress Notes (Signed)
Seizure 8 months ago No allergies to eggs or soy No past problems with anesthesia No home oxygen No diet meds  No internet

## 2016-05-20 NOTE — Telephone Encounter (Signed)
Seizure within the last 8 months.  OK to proceed as scheduled?   Thank you, Levada Dy

## 2016-05-20 NOTE — Telephone Encounter (Signed)
If her last seizure was 8 months ago and they have been well controlled since that time on medication I think it is okay to proceed. If seizure medications aren't working and she continues to have seizures more recently than that please let me know. Thanks

## 2016-05-20 NOTE — Telephone Encounter (Signed)
Patient is calling to see if she can get her x ray results. Please call! [phone]

## 2016-05-21 NOTE — Telephone Encounter (Signed)
Dr. Tamala Julian can you review?

## 2016-05-21 NOTE — Telephone Encounter (Signed)
Call --- patient has diffuse degenerative changes throughout her neck.  This is the cause of her neck pain.

## 2016-05-24 ENCOUNTER — Telehealth: Payer: Self-pay

## 2016-05-24 ENCOUNTER — Other Ambulatory Visit: Payer: Self-pay | Admitting: Family Medicine

## 2016-05-24 NOTE — Telephone Encounter (Signed)
Pt advised.to come in. 

## 2016-05-24 NOTE — Telephone Encounter (Signed)
Pt states she has a migraine and would like [place] to call something in for her   Best number [phone]

## 2016-05-24 NOTE — Telephone Encounter (Signed)
Patient aware via phone.

## 2016-05-25 ENCOUNTER — Other Ambulatory Visit: Payer: Self-pay | Admitting: Physician Assistant

## 2016-05-25 ENCOUNTER — Telehealth: Payer: Self-pay

## 2016-05-25 DIAGNOSIS — B001 Herpesviral vesicular dermatitis: Secondary | ICD-10-CM

## 2016-05-25 NOTE — Telephone Encounter (Signed)
Pt is having a flare up  And is needing her valtrex refilled  Best number [phone]

## 2016-05-26 ENCOUNTER — Telehealth: Payer: Self-pay

## 2016-05-26 MED ORDER — VALACYCLOVIR HCL 1 G PO TABS: ORAL_TABLET | ORAL | Status: DC

## 2016-05-26 NOTE — Telephone Encounter (Signed)
Patient request a refill on Valtrex 1000 MG. Patient is having another flare up. [facility] [address]. [phone].

## 2016-05-26 NOTE — Telephone Encounter (Signed)
Sent in RFs and notified pt. Advised to let us know if she is having frequent outbreaks so we can discuss treatment options. Pt agreed.

## 2016-05-26 NOTE — Telephone Encounter (Signed)
Duplicate message. This was done.

## 2016-06-03 ENCOUNTER — Encounter: Payer: Self-pay | Admitting: Gastroenterology

## 2016-06-03 ENCOUNTER — Ambulatory Visit (AMBULATORY_SURGERY_CENTER): Payer: Medicare Other | Admitting: Gastroenterology

## 2016-06-03 VITALS — BP 161/84 | HR 70 | Temp 98.4°F | Resp 22 | Ht 63.0 in | Wt 120.0 lb

## 2016-06-03 DIAGNOSIS — D128 Benign neoplasm of rectum: Secondary | ICD-10-CM

## 2016-06-03 DIAGNOSIS — D129 Benign neoplasm of anus and anal canal: Secondary | ICD-10-CM

## 2016-06-03 DIAGNOSIS — K635 Polyp of colon: Secondary | ICD-10-CM | POA: Diagnosis not present

## 2016-06-03 DIAGNOSIS — Z1211 Encounter for screening for malignant neoplasm of colon: Secondary | ICD-10-CM

## 2016-06-03 DIAGNOSIS — D127 Benign neoplasm of rectosigmoid junction: Secondary | ICD-10-CM

## 2016-06-03 MED ORDER — SODIUM CHLORIDE 0.9 % IV SOLN: 500.0000 mL | INTRAVENOUS | Status: DC

## 2016-06-03 NOTE — Patient Instructions (Addendum)
YOU HAD AN ENDOSCOPIC PROCEDURE TODAY AT [facility]:   Refer to the procedure report that was given to you for any specific questions about what was found during the examination.  If the procedure report does not answer your questions, please call your gastroenterologist to clarify.  If you requested that your care partner not be given the details of your procedure findings, then the procedure report has been included in a sealed envelope for you to review at your convenience later.  YOU SHOULD EXPECT: Some feelings of bloating in the abdomen. Passage of more gas than usual.  Walking can help get rid of the air that was put into your GI tract during the procedure and reduce the bloating. If you had a lower endoscopy (such as a colonoscopy or flexible sigmoidoscopy) you may notice spotting of blood in your stool or on the toilet paper. If you underwent a bowel prep for your procedure, you may not have a normal bowel movement for a few days.  Please Note:  You might notice some irritation and congestion in your nose or some drainage.  This is from the oxygen used during your procedure.  There is no need for concern and it should clear up in a day or so.  SYMPTOMS TO REPORT IMMEDIATELY:   Following lower endoscopy (colonoscopy or flexible sigmoidoscopy):  Excessive amounts of blood in the stool  Significant tenderness or worsening of abdominal pains  Swelling of the abdomen that is new, acute  Fever of 100F or higher   For urgent or emergent issues, a gastroenterologist can be reached at any hour by calling [phone].   DIET: Your first meal following the procedure should be a small meal and then it is ok to progress to your normal diet. Heavy or fried foods are harder to digest and may make you feel nauseous or bloated.  Likewise, meals heavy in dairy and vegetables can increase bloating.  Drink plenty of fluids but you should avoid alcoholic beverages for 24  hours.  ACTIVITY:  You should plan to take it easy for the rest of today and you should NOT DRIVE or use heavy machinery until tomorrow (because of the sedation medicines used during the test).    FOLLOW UP: Our staff will call the number listed on your records the next business day following your procedure to check on you and address any questions or concerns that you may have regarding the information given to you following your procedure. If we do not reach you, we will leave a message.  However, if you are feeling well and you are not experiencing any problems, there is no need to return our call.  We will assume that you have returned to your regular daily activities without incident.  If any biopsies were taken you will be contacted by phone or by letter within the next 1-3 weeks.  Please call us at [phone] if you have not heard about the biopsies in 3 weeks.    SIGNATURES/CONFIDENTIALITY: You and/or your care partner have signed paperwork which will be entered into your electronic medical record.  These signatures attest to the fact that that the information above on your After Visit Summary has been reviewed and is understood.  Full responsibility of the confidentiality of this discharge information lies with you and/or your care-partner.  Clip card  Provided.  NO ASPIRIN, ASPIRIN CONTAINING PRODUCTS (BC OR GOODY POWDERS), OR NSAIDS (IBUPROFEN, ADVIL, ALEVE, MOTRIN, NAPROSYN) FOR 2 WEEKS-  TYLENOL IS OK  Please read over handouts about polyps and hemorrhoids

## 2016-06-03 NOTE — Op Note (Addendum)
[facility] Patient Name: Melissa Bridges Procedure Date: 06/03/2016 10:57 AM MRN: [id number] Endoscopist: Viviann Spare P. Adela Lank , MD Age: 55 Referring MD:  Date of Birth: 07-03-1961 Gender: Female Account #: [id number] Procedure:                Colonoscopy Indications:              Screening for malignant neoplasm in the colon, This                            is the patient's first colonoscopy Medicines:                Monitored Anesthesia Care Procedure:                Pre-Anesthesia Assessment:                           - Prior to the procedure, a History and Physical                            was performed, and patient medications and                            allergies were reviewed. The patient's tolerance of                            previous anesthesia was also reviewed. The risks                            and benefits of the procedure and the sedation                            options and risks were discussed with the patient.                            All questions were answered, and informed consent                            was obtained. Prior Anticoagulants: The patient has                            taken no previous anticoagulant or antiplatelet                            agents. ASA Grade Assessment: III - A patient with                            severe systemic disease. After reviewing the risks                            and benefits, the patient was deemed in                            satisfactory condition to undergo the procedure.  After obtaining informed consent, the colonoscope                            was passed under direct vision. Throughout the                            procedure, the patient's blood pressure, pulse, and                            oxygen saturations were monitored continuously. The                            Model PCF-H190L [id number]) scope was introduced                            through the  anus and advanced to the the cecum,                            identified by appendiceal orifice and ileocecal                            valve. The colonoscopy was performed without                            difficulty. The patient tolerated the procedure                            well. The quality of the bowel preparation was                            good. The ileocecal valve, appendiceal orifice, and                            rectum were photographed. Scope In: 11:05:22 AM Scope Out: 11:48:59 AM Scope Withdrawal Time: 0 hours 36 minutes 43 seconds  Total Procedure Duration: 0 hours 43 minutes 37 seconds  Findings:                 The perianal and digital rectal examinations were                            normal.                           A large polyp was found in the recto-sigmoid colon,                            roughly 3 to 3.5cm in size or so. The polyp was                            carpet-like and mostly flat. I elected to attempt                            removal via piecemeal cold snare technique given  the majority of the lesion was flat and I thought                            amenable to this technique, and lower risk of                            post-polypectomy bleeding with cold technique. The                            polyp was able removed with a piecemeal technique                            in entirety using a piecemeal cold snare technique.                            Resection and retrieval were thought to be                            complete. To prevent bleeding after the                            polypectomy, three hemostatic clips were                            successfully placed to close the majority of the                            defect as a prophylactic measure. An area near the                            polypectomy site was tattooed with an injection of                            Spot (carbon black).                            Non-bleeding internal hemorrhoids were found during                            retroflexion.                           The exam was otherwise without abnormality. Complications:            No immediate complications. Estimated blood loss:                            Minimal. Estimated Blood Loss:     Estimated blood loss was minimal. Impression:               - One large polyp at the recto-sigmoid colon,                            removed piecemeal using a cold snare. Resected and  retrieved. Clips were placed. Tattooed.                           - Non-bleeding internal hemorrhoids.                           - The examination was otherwise normal. Recommendation:           - Patient has a contact number available for                            emergencies. The signs and symptoms of potential                            delayed complications were discussed with the                            patient. Return to normal activities tomorrow.                            Written discharge instructions were provided to the                            patient.                           - Resume previous diet.                           - Continue present medications.                           - No aspirin, ibuprofen, naproxen, or other                            non-steroidal anti-inflammatory drugs for 2 weeks                            after polyp removal.                           - Await pathology results.                           - Repeat colonoscopy is recommended for                            surveillance. If the polyp is pre-cancerous, a                            repeat surveillance colonoscopy will be recommended                            in 3 months. Viviann Spare P. Tieasha Larsen, MD 06/03/2016 12:10:28 PM This report has been signed electronically.

## 2016-06-03 NOTE — Progress Notes (Signed)
A and O x3. Report to RN. Tolerated MAC anesthesia well. 

## 2016-06-03 NOTE — Progress Notes (Signed)
Called to room to assist during endoscopic procedure.  Patient ID and intended procedure confirmed with present staff. Received instructions for my participation in the procedure from the performing physician.  

## 2016-06-04 ENCOUNTER — Telehealth: Payer: Self-pay | Admitting: Gastroenterology

## 2016-06-04 ENCOUNTER — Telehealth: Payer: Self-pay | Admitting: *Deleted

## 2016-06-04 ENCOUNTER — Ambulatory Visit (HOSPITAL_COMMUNITY): Payer: Self-pay | Admitting: Psychiatry

## 2016-06-04 DIAGNOSIS — M5412 Radiculopathy, cervical region: Secondary | ICD-10-CM | POA: Diagnosis not present

## 2016-06-04 DIAGNOSIS — M5416 Radiculopathy, lumbar region: Secondary | ICD-10-CM | POA: Diagnosis not present

## 2016-06-04 NOTE — Telephone Encounter (Signed)
Patient states she is having abdominal pain and is passing blood and clots with bowel movements. Patient instructed to go to ED for evaluation. Dr. Fuller Plan and Ellouise Newer PA aware.

## 2016-06-04 NOTE — Telephone Encounter (Signed)
  Follow up Call-  Call back number 06/03/2016  Post procedure Call Back phone  # [phone]  Permission to leave phone message Yes     Patient questions:  Mailbox is full.

## 2016-06-08 ENCOUNTER — Ambulatory Visit (INDEPENDENT_AMBULATORY_CARE_PROVIDER_SITE_OTHER): Payer: Medicare Other | Admitting: Physician Assistant

## 2016-06-08 VITALS — BP 142/90 | HR 86 | Temp 98.9°F | Resp 18 | Ht 63.0 in | Wt 117.0 lb

## 2016-06-08 DIAGNOSIS — M549 Dorsalgia, unspecified: Secondary | ICD-10-CM | POA: Diagnosis not present

## 2016-06-08 DIAGNOSIS — G43809 Other migraine, not intractable, without status migrainosus: Secondary | ICD-10-CM

## 2016-06-08 DIAGNOSIS — M79604 Pain in right leg: Secondary | ICD-10-CM

## 2016-06-08 MED ORDER — FLUTICASONE PROPIONATE 50 MCG/ACT NA SUSP: 2.0000 | Freq: Every day | NASAL | Status: DC

## 2016-06-08 MED ORDER — OXYCODONE-ACETAMINOPHEN 5-325 MG PO TABS: 1.0000 | ORAL_TABLET | Freq: Three times a day (TID) | ORAL | Status: DC | PRN

## 2016-06-08 NOTE — Patient Instructions (Signed)
Please hydrate well with water at least 64 oz. Please take the flonase and the claritin, zyrtec, OR allegra from your home daily.    Sciatica With Rehab The sciatic nerve runs from the back down the leg and is responsible for sensation and control of the muscles in the back (posterior) side of the thigh, lower leg, and foot. Sciatica is a condition that is characterized by inflammation of this nerve.  SYMPTOMS   Signs of nerve damage, including numbness and/or weakness along the posterior side of the lower extremity.  Pain in the back of the thigh that may also travel down the leg.  Pain that worsens when sitting for long periods of time.  Occasionally, pain in the back or buttock. CAUSES  Inflammation of the sciatic nerve is the cause of sciatica. The inflammation is due to something irritating the nerve. Common sources of irritation include:  Sitting for long periods of time.  Direct trauma to the nerve.  Arthritis of the spine.  Herniated or ruptured disk.  Slipping of the vertebrae (spondylolisthesis).  Pressure from soft tissues, such as muscles or ligament-like tissue (fascia). RISK INCREASES WITH:  Sports that place pressure or stress on the spine (football or weightlifting).  Poor strength and flexibility.  Failure to warm up properly before activity.  Family history of low back pain or disk disorders.  Previous back injury or surgery.  Poor body mechanics, especially when lifting, or poor posture. PREVENTION   Warm up and stretch properly before activity.  Maintain physical fitness:  Strength, flexibility, and endurance.  Cardiovascular fitness.  Learn and use proper technique, especially with posture and lifting. When possible, have coach correct improper technique.  Avoid activities that place stress on the spine. PROGNOSIS If treated properly, then sciatica usually resolves within 6 weeks. However, occasionally surgery is necessary.  RELATED  COMPLICATIONS   Permanent nerve damage, including pain, numbness, tingle, or weakness.  Chronic back pain.  Risks of surgery: infection, bleeding, nerve damage, or damage to surrounding tissues. TREATMENT Treatment initially involves resting from any activities that aggravate your symptoms. The use of ice and medication may help reduce pain and inflammation. The use of strengthening and stretching exercises may help reduce pain with activity. These exercises may be performed at home or with referral to a therapist. A therapist may recommend further treatments, such as transcutaneous electronic nerve stimulation (TENS) or ultrasound. Your caregiver may recommend corticosteroid injections to help reduce inflammation of the sciatic nerve. If symptoms persist despite non-surgical (conservative) treatment, then surgery may be recommended. MEDICATION  If pain medication is necessary, then nonsteroidal anti-inflammatory medications, such as aspirin and ibuprofen, or other minor pain relievers, such as acetaminophen, are often recommended.  Do not take pain medication for 7 days before surgery.  Prescription pain relievers may be given if deemed necessary by your caregiver. Use only as directed and only as much as you need.  Ointments applied to the skin may be helpful.  Corticosteroid injections may be given by your caregiver. These injections should be reserved for the most serious cases, because they may only be given a certain number of times. HEAT AND COLD  Cold treatment (icing) relieves pain and reduces inflammation. Cold treatment should be applied for 10 to 15 minutes every 2 to 3 hours for inflammation and pain and immediately after any activity that aggravates your symptoms. Use ice packs or massage the area with a piece of ice (ice massage).  Heat treatment may be used prior to  performing the stretching and strengthening activities prescribed by your caregiver, physical therapist, or  athletic trainer. Use a heat pack or soak the injury in warm water. SEEK MEDICAL CARE IF:  Treatment seems to offer no benefit, or the condition worsens.  Any medications produce adverse side effects. EXERCISES  RANGE OF MOTION (ROM) AND STRETCHING EXERCISES - Sciatica Most people with sciatic will find that their symptoms worsen with either excessive bending forward (flexion) or arching at the low back (extension). The exercises which will help resolve your symptoms will focus on the opposite motion. Your physician, physical therapist or athletic trainer will help you determine which exercises will be most helpful to resolve your low back pain. Do not complete any exercises without first consulting with your clinician. Discontinue any exercises which worsen your symptoms until you speak to your clinician. If you have pain, numbness or tingling which travels down into your buttocks, leg or foot, the goal of the therapy is for these symptoms to move closer to your back and eventually resolve. Occasionally, these leg symptoms will get better, but your low back pain may worsen; this is typically an indication of progress in your rehabilitation. Be certain to be very alert to any changes in your symptoms and the activities in which you participated in the 24 hours prior to the change. Sharing this information with your clinician will allow him/her to most efficiently treat your condition. These exercises may help you when beginning to rehabilitate your injury. Your symptoms may resolve with or without further involvement from your physician, physical therapist or athletic trainer. While completing these exercises, remember:   Restoring tissue flexibility helps normal motion to return to the joints. This allows healthier, less painful movement and activity.  An effective stretch should be held for at least 30 seconds.  A stretch should never be painful. You should only feel a gentle lengthening or release  in the stretched tissue. FLEXION RANGE OF MOTION AND STRETCHING EXERCISES: STRETCH - Flexion, Single Knee to Chest   Lie on a firm bed or floor with both legs extended in front of you.  Keeping one leg in contact with the floor, bring your opposite knee to your chest. Hold your leg in place by either grabbing behind your thigh or at your knee.  Pull until you feel a gentle stretch in your low back. Hold __________ seconds.  Slowly release your grasp and repeat the exercise with the opposite side. Repeat __________ times. Complete this exercise __________ times per day.  STRETCH - Flexion, Double Knee to Chest  Lie on a firm bed or floor with both legs extended in front of you.  Keeping one leg in contact with the floor, bring your opposite knee to your chest.  Tense your stomach muscles to support your back and then lift your other knee to your chest. Hold your legs in place by either grabbing behind your thighs or at your knees.  Pull both knees toward your chest until you feel a gentle stretch in your low back. Hold __________ seconds.  Tense your stomach muscles and slowly return one leg at a time to the floor. Repeat __________ times. Complete this exercise __________ times per day.  STRETCH - Low Trunk Rotation   Lie on a firm bed or floor. Keeping your legs in front of you, bend your knees so they are both pointed toward the ceiling and your feet are flat on the floor.  Extend your arms out to the side. This  will stabilize your upper body by keeping your shoulders in contact with the floor.  Gently and slowly drop both knees together to one side until you feel a gentle stretch in your low back. Hold for __________ seconds.  Tense your stomach muscles to support your low back as you bring your knees back to the starting position. Repeat the exercise to the other side. Repeat __________ times. Complete this exercise __________ times per day  EXTENSION RANGE OF MOTION AND  FLEXIBILITY EXERCISES: STRETCH - Extension, Prone on Elbows  Lie on your stomach on the floor, a bed will be too soft. Place your palms about shoulder width apart and at the height of your head.  Place your elbows under your shoulders. If this is too painful, stack pillows under your chest.  Allow your body to relax so that your hips drop lower and make contact more completely with the floor.  Hold this position for __________ seconds.  Slowly return to lying flat on the floor. Repeat __________ times. Complete this exercise __________ times per day.  RANGE OF MOTION - Extension, Prone Press Ups  Lie on your stomach on the floor, a bed will be too soft. Place your palms about shoulder width apart and at the height of your head.  Keeping your back as relaxed as possible, slowly straighten your elbows while keeping your hips on the floor. You may adjust the placement of your hands to maximize your comfort. As you gain motion, your hands will come more underneath your shoulders.  Hold this position __________ seconds.  Slowly return to lying flat on the floor. Repeat __________ times. Complete this exercise __________ times per day.  STRENGTHENING EXERCISES - Sciatica  These exercises may help you when beginning to rehabilitate your injury. These exercises should be done near your "sweet spot." This is the neutral, low-back arch, somewhere between fully rounded and fully arched, that is your least painful position. When performed in this safe range of motion, these exercises can be used for people who have either a flexion or extension based injury. These exercises may resolve your symptoms with or without further involvement from your physician, physical therapist or athletic trainer. While completing these exercises, remember:   Muscles can gain both the endurance and the strength needed for everyday activities through controlled exercises.  Complete these exercises as instructed by your  physician, physical therapist or athletic trainer. Progress with the resistance and repetition exercises only as your caregiver advises.  You may experience muscle soreness or fatigue, but the pain or discomfort you are trying to eliminate should never worsen during these exercises. If this pain does worsen, stop and make certain you are following the directions exactly. If the pain is still present after adjustments, discontinue the exercise until you can discuss the trouble with your clinician. STRENGTHENING - Deep Abdominals, Pelvic Tilt   Lie on a firm bed or floor. Keeping your legs in front of you, bend your knees so they are both pointed toward the ceiling and your feet are flat on the floor.  Tense your lower abdominal muscles to press your low back into the floor. This motion will rotate your pelvis so that your tail bone is scooping upwards rather than pointing at your feet or into the floor.  With a gentle tension and even breathing, hold this position for __________ seconds. Repeat __________ times. Complete this exercise __________ times per day.  STRENGTHENING - Abdominals, Crunches   Lie on a firm bed or  floor. Keeping your legs in front of you, bend your knees so they are both pointed toward the ceiling and your feet are flat on the floor. Cross your arms over your chest.  Slightly tip your chin down without bending your neck.  Tense your abdominals and slowly lift your trunk high enough to just clear your shoulder blades. Lifting higher can put excessive stress on the low back and does not further strengthen your abdominal muscles.  Control your return to the starting position. Repeat __________ times. Complete this exercise __________ times per day.  STRENGTHENING - Quadruped, Opposite UE/LE Lift  Assume a hands and knees position on a firm surface. Keep your hands under your shoulders and your knees under your hips. You may place padding under your knees for comfort.  Find  your neutral spine and gently tense your abdominal muscles so that you can maintain this position. Your shoulders and hips should form a rectangle that is parallel with the floor and is not twisted.  Keeping your trunk steady, lift your right hand no higher than your shoulder and then your left leg no higher than your hip. Make sure you are not holding your breath. Hold this position __________ seconds.  Continuing to keep your abdominal muscles tense and your back steady, slowly return to your starting position. Repeat with the opposite arm and leg. Repeat __________ times. Complete this exercise __________ times per day.  STRENGTHENING - Abdominals and Quadriceps, Straight Leg Raise   Lie on a firm bed or floor with both legs extended in front of you.  Keeping one leg in contact with the floor, bend the other knee so that your foot can rest flat on the floor.  Find your neutral spine, and tense your abdominal muscles to maintain your spinal position throughout the exercise.  Slowly lift your straight leg off the floor about 6 inches for a count of 15, making sure to not hold your breath.  Still keeping your neutral spine, slowly lower your leg all the way to the floor. Repeat this exercise with each leg __________ times. Complete this exercise __________ times per day. POSTURE AND BODY MECHANICS CONSIDERATIONS - Sciatica Keeping correct posture when sitting, standing or completing your activities will reduce the stress put on different body tissues, allowing injured tissues a chance to heal and limiting painful experiences. The following are general guidelines for improved posture. Your physician or physical therapist will provide you with any instructions specific to your needs. While reading these guidelines, remember:  The exercises prescribed by your provider will help you have the flexibility and strength to maintain correct postures.  The correct posture provides the optimal  environment for your joints to work. All of your joints have less wear and tear when properly supported by a spine with good posture. This means you will experience a healthier, less painful body.  Correct posture must be practiced with all of your activities, especially prolonged sitting and standing. Correct posture is as important when doing repetitive low-stress activities (typing) as it is when doing a single heavy-load activity (lifting). RESTING POSITIONS Consider which positions are most painful for you when choosing a resting position. If you have pain with flexion-based activities (sitting, bending, stooping, squatting), choose a position that allows you to rest in a less flexed posture. You would want to avoid curling into a fetal position on your side. If your pain worsens with extension-based activities (prolonged standing, working overhead), avoid resting in an extended position such as  sleeping on your stomach. Most people will find more comfort when they rest with their spine in a more neutral position, neither too rounded nor too arched. Lying on a non-sagging bed on your side with a pillow between your knees, or on your back with a pillow under your knees will often provide some relief. Keep in mind, being in any one position for a prolonged period of time, no matter how correct your posture, can still lead to stiffness. PROPER SITTING POSTURE In order to minimize stress and discomfort on your spine, you must sit with correct posture Sitting with good posture should be effortless for a healthy body. Returning to good posture is a gradual process. Many people can work toward this most comfortably by using various supports until they have the flexibility and strength to maintain this posture on their own. When sitting with proper posture, your ears will fall over your shoulders and your shoulders will fall over your hips. You should use the back of the chair to support your upper back. Your  low back will be in a neutral position, just slightly arched. You may place a small pillow or folded towel at the base of your low back for support.  When working at a desk, create an environment that supports good, upright posture. Without extra support, muscles fatigue and lead to excessive strain on joints and other tissues. Keep these recommendations in mind: CHAIR:   A chair should be able to slide under your desk when your back makes contact with the back of the chair. This allows you to work closely.  The chair's height should allow your eyes to be level with the upper part of your monitor and your hands to be slightly lower than your elbows. BODY POSITION  Your feet should make contact with the floor. If this is not possible, use a foot rest.  Keep your ears over your shoulders. This will reduce stress on your neck and low back. INCORRECT SITTING POSTURES   If you are feeling tired and unable to assume a healthy sitting posture, do not slouch or slump. This puts excessive strain on your back tissues, causing more damage and pain. Healthier options include:  Using more support, like a lumbar pillow.  Switching tasks to something that requires you to be upright or walking.  Talking a brief walk.  Lying down to rest in a neutral-spine position. PROLONGED STANDING WHILE SLIGHTLY LEANING FORWARD  When completing a task that requires you to lean forward while standing in one place for a long time, place either foot up on a stationary 2-4 inch high object to help maintain the best posture. When both feet are on the ground, the low back tends to lose its slight inward curve. If this curve flattens (or becomes too large), then the back and your other joints will experience too much stress, fatigue more quickly and can cause pain.  CORRECT STANDING POSTURES Proper standing posture should be assumed with all daily activities, even if they only take a few moments, like when brushing your teeth.  As in sitting, your ears should fall over your shoulders and your shoulders should fall over your hips. You should keep a slight tension in your abdominal muscles to brace your spine. Your tailbone should point down to the ground, not behind your body, resulting in an over-extended swayback posture.  INCORRECT STANDING POSTURES  Common incorrect standing postures include a forward head, locked knees and/or an excessive swayback. WALKING Walk with an  upright posture. Your ears, shoulders and hips should all line-up. PROLONGED ACTIVITY IN A FLEXED POSITION When completing a task that requires you to bend forward at your waist or lean over a low surface, try to find a way to stabilize 3 of 4 of your limbs. You can place a hand or elbow on your thigh or rest a knee on the surface you are reaching across. This will provide you more stability so that your muscles do not fatigue as quickly. By keeping your knees relaxed, or slightly bent, you will also reduce stress across your low back. CORRECT LIFTING TECHNIQUES DO :   Assume a wide stance. This will provide you more stability and the opportunity to get as close as possible to the object which you are lifting.  Tense your abdominals to brace your spine; then bend at the knees and hips. Keeping your back locked in a neutral-spine position, lift using your leg muscles. Lift with your legs, keeping your back straight.  Test the weight of unknown objects before attempting to lift them.  Try to keep your elbows locked down at your sides in order get the best strength from your shoulders when carrying an object.  Always ask for help when lifting heavy or awkward objects. INCORRECT LIFTING TECHNIQUES DO NOT:   Lock your knees when lifting, even if it is a small object.  Bend and twist. Pivot at your feet or move your feet when needing to change directions.  Assume that you cannot safely pick up a paperclip without proper posture.   This information  is not intended to replace advice given to you by your health care provider. Make sure you discuss any questions you have with your health care provider.   Document Released: 11/29/2005 Document Revised: 04/15/2015 Document Reviewed: 03/13/2009 [employer] Interactive Patient Education [employer].

## 2016-06-08 NOTE — Progress Notes (Signed)
[facility] [address] [place] [address] [phone]  Date:  06/08/2016   Name:  Melissa Bridges   DOB:  January 26, 1961   MRN:  [id number]  PCP:  Reginia Forts, MD    History of Present Illness:  Melissa Bridges is a 55 y.o. female patient who presents to [facility] for cc of migraine and Chief Complaint  Patient presents with  . Migraine    x 2days  . Sciatica  . Medication Refill    oxycodone   Hx of sciatica of right sidepain, that shoots down her right leg.  But has been more prominent over the last few days.  Was using percocet 1-3 times per week, but this has since worsened.  She has been without medication for 1 week.  There is significant numbness and tingling to the knee.  May have some weakness, but very rare.  No urinary or bowel incontinence.   She has been taking the naproxen but then percocet has also helped with this pain.  She has been advised to not use prednisone after schizoaffective flare that daughter has encountered.  She does not remember what transpires during these episodes.    Head pain stopped hurting today.  Intermittent pain along the right temple and down the neck.  This has since stopped.  There were no associated auras, dizziness, nausea, or photo/phonosensitivity.  She has had this pain before, but has been for the last 2 days.  Hydration is minimal.     Patient Active Problem List   Diagnosis Date Noted  . Localization-related idiopathic epilepsy and epileptic syndromes with seizures of localized onset, not intractable, without status epilepticus ([place]) 10/16/2015  . 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 Elevated LFTs 09/09/2014  . Alcohol abuse 09/06/2014  . Asthma with acute exacerbation 03/09/2013  . Hypertension 03/09/2013  . Lower back pain 03/09/2013  . Chronic hepatitis C without hepatic coma ([place]) 03/08/2013  . Smoker 12/12/2012  . Schizoaffective disorder ([place]) 12/12/2012  . Substance abuse 11/14/2011    Past Medical  History  Diagnosis Date  . Sciatic pain   . Schizoaffective disorder   . Hepatitis C   . Arthritis   . Asthma   . Migraine   . Allergy   . Depression   . Anxiety   . Alopecia   . Substance abuse   . Hypertension     onset age 89.  [name] Shortness of breath dyspnea   . COPD (chronic obstructive pulmonary disease) ([place])   . Schizoaffective disorder ([place])   . GERD (gastroesophageal reflux disease)     o cc- takes OTC if needed.  . Iron deficiency anemia   . Glaucoma   . Cataract     "mild"  . Seizures ([place])     onset in childhood. 06-03-16- per pt 8 months agoGeneralized tonic-clonic.Last seizure couple of months ago    Past Surgical History  Procedure Laterality Date  . Dilation and curettage of uterus      x2  . Exploratory laparotomy      x3  . Ovarian cyst removal  2008  . Abdominal hysterectomy      ovarian cyst B, cervical dysplasia, fibroids.   Ovaries intact.  . Gynecologic cryosurgery      x3  . Mandible reconstruction N/A 06/27/2015    Procedure: REMOVAL MAXILLARY PALATAL TORUS.  REMOVAL MANDIBULAR TORUS AND EXOSTOSIS.  ;  Surgeon: Diona Browner, DDS;  Location: [facility];  Service: Oral Surgery;  Laterality:  N/A;    Social History  Substance Use Topics  . Smoking status: Current Every Day Smoker -- 0.33 packs/day for 44 years    Types: Cigarettes  . Smokeless tobacco: Never Used  . Alcohol Use: 8.4 oz/week    14 Cans of beer per week     Comment: occasionally    Family History  Problem Relation Age of Onset  . Diabetes type II Mother   . Hypertension Mother   . Arthritis Mother   . Diabetes Mother   . Mental illness Mother     bipolar  . Diabetes type II Maternal Aunt   . Cancer Maternal Uncle   . Hypertension Father   . Heart murmur Father   . Arthritis Father   . Stroke Father   . Alopecia Sister   . Alopecia Brother   . Alopecia Sister   . Mental retardation Sister     depression  . Colon cancer Neg Hx   . Esophageal cancer Neg Hx   . Rectal  cancer Neg Hx   . Stomach cancer Neg Hx     Allergies  Allergen Reactions  . Fruit & Vegetable Daily [Nutritional Supplements] Shortness Of Breath    Aloe  . Ibuprofen Other (See Comments)    Stomach upset; However, pt can take Meloxicam without incident and takes this at home.   . Aspirin Rash and Other (See Comments)    Stomach upset    Medication list has been reviewed and updated.  Current Outpatient Prescriptions on File Prior to Visit  Medication Sig Dispense Refill  . amLODipine (NORVASC) 5 MG tablet TAKE 1 TABLET (5 MG TOTAL) BY MOUTH DAILY. HIGH BLOOD PRESSURE 30 tablet 2  . budesonide-formoterol (SYMBICORT) 80-4.5 MCG/ACT inhaler Inhale 2 puffs into the lungs daily as needed (for shortness of breath). 1 Inhaler 12  . citalopram (CELEXA) 10 MG tablet Take 1 tablet (10 mg total) by mouth daily. For depression 30 tablet 2  . conjugated estrogens (PREMARIN) vaginal cream Place 1 Applicatorful vaginally daily. 30 g 12  . ferrous sulfate 325 (65 FE) MG tablet Take 1 tablet (325 mg total) by mouth daily with breakfast. 90 tablet 1  . ipratropium-albuterol (DUONEB) 0.5-2.5 (3) MG/3ML SOLN Take 3 mLs by nebulization every 4 (four) hours as needed (shortness of breath). Reported on 12/02/2015    . LamoTRIgine (LAMICTAL XR) 25 MG TB24 tablet take 1 tablet daily for 2 weeks, then increase to 2 tablets daily for 2 weeks, then increase to 4 tablets daily then see me in follow-up (Patient taking differently: 3 (three) times daily. take 1 tablet daily for 2 weeks, then increase to 2 tablets daily for 2 weeks, then increase to 4 tablets daily then see me in follow-up) 120 tablet 0  . methocarbamol (ROBAXIN) 750 MG tablet Take 1 tablet (750 mg total) by mouth every 8 (eight) hours as needed for muscle spasms. 60 tablet 0  . oxyCODONE-acetaminophen (PERCOCET/ROXICET) 5-325 MG tablet Take 1 tablet by mouth every 6 (six) hours as needed for severe pain. 60 tablet 0  . QUEtiapine (SEROQUEL) 300 MG  tablet Take 1 tablet (300 mg total) by mouth at bedtime. Mood control 30 tablet 2  . valACYclovir (VALTREX) 1000 MG tablet Take 2 tabs po at symptom onset, repeat in 12 hours 20 tablet 3  . VENTOLIN HFA 108 (90 Base) MCG/ACT inhaler INHALE TWO PUFFS BY MOUTH EVERY 6 HOURS AS NEEDED FOR SHORTNESS OF BREATH 18 each 2  . naproxen (NAPROSYN)  500 MG tablet TAKE 1 TABLET (500 MG TOTAL) BY MOUTH 2 (TWO) TIMES DAILY WITH A MEAL. (Patient not taking: Reported on 06/08/2016) 180 tablet 1   No current facility-administered medications on file prior to visit.    ROS ROS otherwise unremarkable unless listed above.  Physical Examination: BP 142/90 mmHg  Pulse 86  Temp(Src) 98.9 F (37.2 C) (Oral)  Resp 18  Ht 5\' 3"  (1.6 m)  Wt 117 lb (53.071 kg)  BMI 20.73 kg/m2  SpO2 99% Ideal Body Weight: Weight in (lb) to have BMI = 25: 140.8  Physical Exam  Constitutional: She is oriented to person, place, and time. She appears well-developed and well-nourished. No distress.  HENT:  Head: Normocephalic and atraumatic.  Right Ear: External ear normal.  Left Ear: External ear normal.  Eyes: Conjunctivae and EOM are normal. Pupils are equal, round, and reactive to light.  Cardiovascular: Normal rate.   Pulmonary/Chest: Effort normal. No respiratory distress.  Musculoskeletal:       Right hip: She exhibits normal strength.  No swelling.  Spinous tenderness at the sacrum, and tender at adjacent muscle.  Positive straight leg raise test.   Neurological: She is alert and oriented to person, place, and time.  Skin: She is not diaphoretic.  Psychiatric: She has a normal mood and affect. Her behavior is normal.     Assessment and Plan: Consetta Akita is a 55 y.o. female who is here today for a migraine, back and shoulder pain. Refill given while we are awaiting ortho.  Control viewed without multi-provider use.  Will avoid steroid use at this time, to avoid schizoaffective flare up of sxs.   Migraine resolved.   Advised heavy hydration and flonase at this time.  She will use the 2nd generation antihistamine at this time.  Pain of back and right lower extremity - Plan: oxyCODONE-acetaminophen (PERCOCET/ROXICET) 5-325 MG tablet  Other type of migraine - Plan: fluticasone (FLONASE) 50 MCG/ACT nasal spray  Ivar Drape, PA-C [facility] 06/08/2016 6:10 PM

## 2016-06-09 ENCOUNTER — Ambulatory Visit (INDEPENDENT_AMBULATORY_CARE_PROVIDER_SITE_OTHER): Payer: PRIVATE HEALTH INSURANCE | Admitting: Psychiatry

## 2016-06-09 ENCOUNTER — Other Ambulatory Visit: Payer: Self-pay | Admitting: Orthopedic Surgery

## 2016-06-09 ENCOUNTER — Encounter (HOSPITAL_COMMUNITY): Payer: Self-pay | Admitting: Psychiatry

## 2016-06-09 VITALS — BP 118/74 | HR 87 | Ht 63.75 in | Wt 117.0 lb

## 2016-06-09 DIAGNOSIS — F250 Schizoaffective disorder, bipolar type: Secondary | ICD-10-CM | POA: Diagnosis not present

## 2016-06-09 DIAGNOSIS — M542 Cervicalgia: Secondary | ICD-10-CM

## 2016-06-09 DIAGNOSIS — M545 Low back pain: Secondary | ICD-10-CM

## 2016-06-09 MED ORDER — CITALOPRAM HYDROBROMIDE 10 MG PO TABS: 10.0000 mg | ORAL_TABLET | Freq: Every day | ORAL | Status: DC

## 2016-06-09 MED ORDER — QUETIAPINE FUMARATE 300 MG PO TABS: 300.0000 mg | ORAL_TABLET | Freq: Every day | ORAL | Status: DC
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
## 2016-06-10 ENCOUNTER — Encounter: Payer: Self-pay | Admitting: Gastroenterology

## 2016-06-16 ENCOUNTER — Ambulatory Visit (INDEPENDENT_AMBULATORY_CARE_PROVIDER_SITE_OTHER): Payer: PRIVATE HEALTH INSURANCE | Admitting: Licensed Clinical Social Worker

## 2016-06-16 DIAGNOSIS — F250 Schizoaffective disorder, bipolar type: Secondary | ICD-10-CM | POA: Diagnosis not present

## 2016-06-16 NOTE — Progress Notes (Deleted)
Psychiatric Initial Child/Adolescent Assessment   Patient Identification: Melissa Bridges MRN:  [id number] Date of Evaluation:  06/16/2016 Referral Source: Dr. Adele Schilder Chief Complaint:   Chief Complaint    Establish Care     Visit Diagnosis:    ICD-9-CM ICD-10-CM   1. Schizoaffective disorder, bipolar type ([place]) 295.70 F25.0     History of Present Illness:: ***  Associated Signs/Symptoms: Depression Symptoms:  {DEPRESSION SYMPTOMS:20000} (Hypo) Manic Symptoms:  {BHH MANIC SYMPTOMS:22872} Anxiety Symptoms:  {BHH ANXIETY SYMPTOMS:22873} Psychotic Symptoms:  {BHH PSYCHOTIC SYMPTOMS:22874} PTSD Symptoms: {BHH PTSD SYMPTOMS:22875}  Past Psychiatric History: ***  Previous Psychotropic Medications: {YES/NO:21197}  Substance Abuse History in the last 12 months:  {yes no:314532}  Consequences of Substance Abuse: {BHH CONSEQUENCES OF SUBSTANCE ABUSE:22880}  Past Medical History:  Past Medical History  Diagnosis Date  . Sciatic pain   . Schizoaffective disorder   . Hepatitis C   . Arthritis   . Asthma   . Migraine   . Allergy   . Depression   . Anxiety   . Alopecia   . Substance abuse   . Hypertension     onset age 41.  [name] Shortness of breath dyspnea   . COPD (chronic obstructive pulmonary disease) ([place])   . Schizoaffective disorder ([place])   . GERD (gastroesophageal reflux disease)     o cc- takes OTC if needed.  . Iron deficiency anemia   . Glaucoma   . Cataract     "mild"  . Seizures ([place])     onset in childhood. 06-03-16- per pt 8 months agoGeneralized tonic-clonic.Last seizure couple of months ago    Past Surgical History  Procedure Laterality Date  . Dilation and curettage of uterus      x2  . Exploratory laparotomy      x3  . Ovarian cyst removal  2008  . Abdominal hysterectomy      ovarian cyst B, cervical dysplasia, fibroids.   Ovaries intact.  . Gynecologic cryosurgery      x3  . Mandible reconstruction N/A 06/27/2015    Procedure: REMOVAL MAXILLARY  PALATAL TORUS.  REMOVAL MANDIBULAR TORUS AND EXOSTOSIS.  ;  Surgeon: Diona Browner, DDS;  Location: [facility];  Service: Oral Surgery;  Laterality: N/A;    Family Psychiatric History: ***  Family History:  Family History  Problem Relation Age of Onset  . Diabetes type II Mother   . Hypertension Mother   . Arthritis Mother   . Diabetes Mother   . Mental illness Mother     bipolar  . Diabetes type II Maternal Aunt   . Cancer Maternal Uncle   . Hypertension Father   . Heart murmur Father   . Arthritis Father   . Stroke Father   . Alopecia Sister   . Alopecia Brother   . Alopecia Sister   . Mental retardation Sister     depression  . Colon cancer Neg Hx   . Esophageal cancer Neg Hx   . Rectal cancer Neg Hx   . Stomach cancer Neg Hx     Social History:   Social History   Social History  . Marital Status: Widowed    Spouse Name: N/A  . Number of Children: 1  . Years of Education: N/A   Occupational History  . disabled     mental illness; seizures   Social History Main Topics  . Smoking status: Current Every Day Smoker -- 0.33 packs/day for 44 years    Types: Cigarettes  . Smokeless  tobacco: Never Used  . Alcohol Use: No     Comment: occasionally  . Drug Use: No     Comment: 06-03-16- pt states she has not smoke marijuana for 3 years  . Sexual Activity: No     Comment: widow   Other Topics Concern  . Not on file   Social History Narrative   Marital status:  Widowed since 2002.  Married x 16 years. + dating.  Moved from [place] to live with daughter in 2013.     Children:  One child/daughter (33); two grandchildren.      Lives: with daughter, grandchildren 2.  Does not drive due to epilepsy.      Employment:  Disability for schizoaffective disorder.      Tobacco: 1 ppd x since 8th grade.      Alcohol:  Social; rare drinking due to seizure medications.  Weekends.       Drugs: none; previous use of marijuana.  Previous iv drug use, cocaine.      Exercise: none       Seatbelt:  100%      Guns: none    Additional Social History: ***   Developmental History: Prenatal History: *** Birth History: *** Postnatal Infancy: *** Developmental History: *** Milestones:  Sit-Up: ***  Crawl: ***  Walk: ***  Speech: *** School History: *** Legal History: *** Hobbies/Interests: ***  Allergies:   Allergies  Allergen Reactions  . Fruit & Vegetable Daily [Nutritional Supplements] Shortness Of Breath    Aloe  . Ibuprofen Other (See Comments)    Stomach upset; However, pt can take Meloxicam without incident and takes this at home.   . Aspirin Rash and Other (See Comments)    Stomach upset    Metabolic Disorder Labs: Lab Results  Component Value Date   HGBA1C 5.8* 04/18/2013   MPG 120* 04/18/2013   No results found for: PROLACTIN Lab Results  Component Value Date   CHOL 196 04/18/2013   TRIG 144 04/18/2013   HDL 65 04/18/2013   CHOLHDL 3.0 04/18/2013   VLDL 29 04/18/2013   LDLCALC 102* 04/18/2013    Current Medications: Current Outpatient Prescriptions  Medication Sig Dispense Refill  . amLODipine (NORVASC) 5 MG tablet TAKE 1 TABLET (5 MG TOTAL) BY MOUTH DAILY. HIGH BLOOD PRESSURE 30 tablet 2  . budesonide-formoterol (SYMBICORT) 80-4.5 MCG/ACT inhaler Inhale 2 puffs into the lungs daily as needed (for shortness of breath). 1 Inhaler 12  . citalopram (CELEXA) 10 MG tablet Take 1 tablet (10 mg total) by mouth daily. For depression 30 tablet 1  . conjugated estrogens (PREMARIN) vaginal cream Place 1 Applicatorful vaginally daily. 30 g 12  . ferrous sulfate 325 (65 FE) MG tablet Take 1 tablet (325 mg total) by mouth daily with breakfast. 90 tablet 1  . fluticasone (FLONASE) 50 MCG/ACT nasal spray Place 2 sprays into both nostrils daily. 16 g 1  . ipratropium-albuterol (DUONEB) 0.5-2.5 (3) MG/3ML SOLN Take 3 mLs by nebulization every 4 (four) hours as needed (shortness of breath). Reported on 12/02/2015    . LamoTRIgine (LAMICTAL XR) 25  MG TB24 tablet take 1 tablet daily for 2 weeks, then increase to 2 tablets daily for 2 weeks, then increase to 4 tablets daily then see me in follow-up (Patient taking differently: 3 (three) times daily. take 1 tablet daily for 2 weeks, then increase to 2 tablets daily for 2 weeks, then increase to 4 tablets daily then see me in follow-up) 120 tablet 0  .  methocarbamol (ROBAXIN) 750 MG tablet Take 1 tablet (750 mg total) by mouth every 8 (eight) hours as needed for muscle spasms. 60 tablet 0  . naproxen (NAPROSYN) 500 MG tablet TAKE 1 TABLET (500 MG TOTAL) BY MOUTH 2 (TWO) TIMES DAILY WITH A MEAL. 180 tablet 1  . oxyCODONE-acetaminophen (PERCOCET/ROXICET) 5-325 MG tablet Take 1 tablet by mouth every 8 (eight) hours as needed for severe pain. 30 tablet 0  . QUEtiapine (SEROQUEL) 300 MG tablet Take 1 tablet (300 mg total) by mouth at bedtime. Mood control 30 tablet 1  . valACYclovir (VALTREX) 1000 MG tablet Take 2 tabs po at symptom onset, repeat in 12 hours 20 tablet 3  . VENTOLIN HFA 108 (90 Base) MCG/ACT inhaler INHALE TWO PUFFS BY MOUTH EVERY 6 HOURS AS NEEDED FOR SHORTNESS OF BREATH 18 each 2   No current facility-administered medications for this visit.    Neurologic: Headache: {BHH YES OR NO:22294} Seizure: {BHH YES OR NO:22294} Paresthesias: {BHH YES OR NO:22294}  Musculoskeletal: Strength & Muscle Tone: {desc; muscle tone:32375} Gait & Station: {PE GAIT ED EF:6704556 Patient leans: {Patient Leans:21022755}  Psychiatric Specialty Exam: ROS  There were no vitals taken for this visit.There is no weight on file to calculate BMI.  General Appearance: {Appearance:22683}  Eye Contact:  {BHH EYE CONTACT:22684}  Speech:  {Speech:22685}  Volume:  {Volume (PAA):22686}  Mood:  {BHH MOOD:22306}  Affect:  {Affect (PAA):22687}  Thought Process:  {Thought Process (PAA):22688}  Orientation:  {BHH ORIENTATION (PAA):22689}  Thought Content:  {Thought Content:22690}  Suicidal Thoughts:  {ST/HT  (PAA):22692}  Homicidal Thoughts:  {ST/HT (PAA):22692}  Memory:  {BHH MEMORY:22881}  Judgement:  {Judgement (PAA):22694}  Insight:  {Insight (PAA):22695}  Psychomotor Activity:  {Psychomotor (PAA):22696}  Concentration: {Concentration:21399}  Recall:  {BHH GOOD/FAIR/POOR:22877}  Fund of Knowledge: {BHH GOOD/FAIR/POOR:22877}  Language: {BHH GOOD/FAIR/POOR:22877}  Akathisia:  {BHH YES OR NO:22294}  Handed:  {Handed:22697}  AIMS (if indicated):  ***  Assets:  {Assets (PAA):22698}  ADL's:  {BHH XO:4411959  Cognition: {chl bhh cognition:304700322}  Sleep:  ***     Treatment Plan Summary: {CHL AMB BH MD [place] [phone]}   Malaney Mcbean S, Licensed Cli [id number] PM
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
## 2016-06-17 ENCOUNTER — Telehealth (HOSPITAL_COMMUNITY): Payer: Self-pay | Admitting: Licensed Clinical Social Worker

## 2016-06-19 ENCOUNTER — Ambulatory Visit
Admission: RE | Admit: 2016-06-19 | Discharge: 2016-06-19 | Disposition: A | Discharge status: Home / Self Care | Payer: Medicare Other | Source: Ambulatory Visit | Attending: Orthopedic Surgery | Admitting: Orthopedic Surgery

## 2016-06-19 ENCOUNTER — Other Ambulatory Visit: Payer: Self-pay

## 2016-06-19 DIAGNOSIS — M545 Low back pain: Secondary | ICD-10-CM

## 2016-06-19 DIAGNOSIS — M542 Cervicalgia: Secondary | ICD-10-CM

## 2016-06-19 DIAGNOSIS — M4806 Spinal stenosis, lumbar region: Secondary | ICD-10-CM | POA: Diagnosis not present

## 2016-06-21 ENCOUNTER — Telehealth (HOSPITAL_COMMUNITY): Payer: Self-pay | Admitting: Licensed Clinical Social Worker

## 2016-06-23 ENCOUNTER — Telehealth (HOSPITAL_COMMUNITY): Payer: Self-pay | Admitting: Licensed Clinical Social Worker

## 2016-06-23 ENCOUNTER — Encounter (HOSPITAL_COMMUNITY): Payer: Self-pay | Admitting: Licensed Clinical Social Worker

## 2016-06-26 ENCOUNTER — Ambulatory Visit: Payer: Medicare Other

## 2016-06-28 DIAGNOSIS — M533 Sacrococcygeal disorders, not elsewhere classified: Secondary | ICD-10-CM | POA: Diagnosis not present

## 2016-06-28 DIAGNOSIS — M545 Low back pain: Secondary | ICD-10-CM | POA: Diagnosis not present

## 2016-06-29 ENCOUNTER — Ambulatory Visit: Payer: Medicare Other

## 2016-06-30 ENCOUNTER — Ambulatory Visit (INDEPENDENT_AMBULATORY_CARE_PROVIDER_SITE_OTHER): Payer: Medicare Other | Admitting: Family Medicine

## 2016-06-30 ENCOUNTER — Ambulatory Visit (HOSPITAL_COMMUNITY): Payer: PRIVATE HEALTH INSURANCE | Admitting: Licensed Clinical Social Worker

## 2016-06-30 VITALS — BP 112/62 | HR 73 | Temp 98.6°F | Ht 63.5 in | Wt 119.0 lb

## 2016-06-30 DIAGNOSIS — R300 Dysuria: Secondary | ICD-10-CM

## 2016-06-30 DIAGNOSIS — M79604 Pain in right leg: Secondary | ICD-10-CM | POA: Diagnosis not present

## 2016-06-30 DIAGNOSIS — D229 Melanocytic nevi, unspecified: Secondary | ICD-10-CM | POA: Diagnosis not present

## 2016-06-30 DIAGNOSIS — F250 Schizoaffective disorder, bipolar type: Secondary | ICD-10-CM | POA: Diagnosis not present

## 2016-06-30 DIAGNOSIS — D126 Benign neoplasm of colon, unspecified: Secondary | ICD-10-CM | POA: Diagnosis not present

## 2016-06-30 DIAGNOSIS — L309 Dermatitis, unspecified: Secondary | ICD-10-CM

## 2016-06-30 DIAGNOSIS — M549 Dorsalgia, unspecified: Secondary | ICD-10-CM

## 2016-06-30 LAB — POCT URINALYSIS DIP (MANUAL ENTRY)
BILIRUBIN UA: NEGATIVE
Bilirubin, UA: NEGATIVE
GLUCOSE UA: NEGATIVE
LEUKOCYTES UA: NEGATIVE
Nitrite, UA: NEGATIVE
Protein Ur, POC: NEGATIVE
SPEC GRAV UA: 1.015
UROBILINOGEN UA: 0.2
pH, UA: 8.5

## 2016-06-30 LAB — POC MICROSCOPIC URINALYSIS (UMFC)

## 2016-06-30 MED ORDER — TRIAMCINOLONE ACETONIDE 0.1 % EX CREA: 1.0000 "application " | TOPICAL_CREAM | Freq: Two times a day (BID) | CUTANEOUS | Status: DC

## 2016-06-30 MED ORDER — OXYCODONE-ACETAMINOPHEN 5-325 MG PO TABS: 1.0000 | ORAL_TABLET | Freq: Three times a day (TID) | ORAL | Status: DC | PRN

## 2016-06-30 MED ORDER — NAPROXEN 500 MG PO TABS: 500.0000 mg | ORAL_TABLET | Freq: Two times a day (BID) | ORAL | Status: DC

## 2016-06-30 NOTE — Patient Instructions (Signed)
     IF you received an x-ray today, you will receive an invoice from [facility]. Please contact [facility] at [phone] with questions or concerns regarding your invoice.   IF you received labwork today, you will receive an invoice from [facility]. Please contact [facility] at [phone] with questions or concerns regarding your invoice.   Our billing staff will not be able to assist you with questions regarding bills from these companies.  You will be contacted with the lab results as soon as they are available. The fastest way to get your results is to activate your My Chart account. Instructions are located on the last page of this paperwork. If you have not heard from us regarding the results in 2 weeks, please contact this office.      

## 2016-06-30 NOTE — Progress Notes (Signed)
Subjective:    Patient ID: Melissa Bridges, female    DOB: 11-Mar-1961, 55 y.o.   MRN: [id number]  06/30/2016  Medication Refill (Percocet, Naproxen) and Urinary Frequency (pt ? UTI or STD)   HPI This 55 y.o. female presents for follow-up:  1.  Schizoaffective disorder: established with psychiatry.  No change in medications.  Arfeen; follow-up in two weeks.  2.  Serrated adenoma: s/p colonoscopy.  Repeat colonoscopy warranted in 3 months.    3.  DDD lumbar spine: scheduled for injection scheduled soon.  4.  Rash on elbows: requesting dermatology evaluation; also having moles developing.    5.  Neck pain: scheduling MRI for neck in upcoming month.  6.  Dysuria:  Onset two days ago.  NO fever/chills/sweats.  +lower abdominal pain.  +frequency; no hematuria.  Nocturia x 8.  Baseline x 3.  No flank pain.     Review of Systems  Constitutional: Negative for chills, diaphoresis, fatigue and fever.  Eyes: Negative for visual disturbance.  Respiratory: Negative for cough and shortness of breath.   Cardiovascular: Negative for chest pain, palpitations and leg swelling.  Gastrointestinal: Positive for abdominal pain. Negative for constipation, diarrhea, nausea and vomiting.  Endocrine: Negative for cold intolerance, heat intolerance, polydipsia, polyphagia and polyuria.  Genitourinary: Positive for dysuria, frequency and urgency. Negative for flank pain, genital sores, hematuria, pelvic pain, vaginal discharge and vaginal pain.  Musculoskeletal: Positive for back pain, neck pain and neck stiffness.  Neurological: Negative for dizziness, tremors, seizures, syncope, facial asymmetry, speech difficulty, weakness, light-headedness, numbness and headaches.    Past Medical History:  Diagnosis Date  . Allergy   . Alopecia   . Anxiety   . Arthritis   . Asthma   . Cataract    "mild"  . COPD (chronic obstructive pulmonary disease) (HCC)   . Depression   . GERD (gastroesophageal reflux  disease)    o cc- takes OTC if needed.  . Glaucoma   . Hepatitis C   . Hypertension    onset age 32.  . Iron deficiency anemia   . Migraine   . Schizoaffective disorder   . Schizoaffective disorder (HCC)   . Sciatic pain   . Seizures (HCC)    onset in childhood. 06-03-16- per pt 8 months agoGeneralized tonic-clonic.Last seizure couple of months ago  . Shortness of breath dyspnea   . Substance abuse    Past Surgical History:  Procedure Laterality Date  . ABDOMINAL HYSTERECTOMY     ovarian cyst B, cervical dysplasia, fibroids.   Ovaries intact.  . COLONOSCOPY W/ BIOPSIES    . DILATION AND CURETTAGE OF UTERUS     x2  . EXPLORATORY LAPAROTOMY     x3  . GYNECOLOGIC CRYOSURGERY     x3  . MANDIBLE RECONSTRUCTION N/A 06/27/2015   Procedure: REMOVAL MAXILLARY PALATAL TORUS.  REMOVAL MANDIBULAR TORUS AND EXOSTOSIS.  ;  Surgeon: Ocie Doyne, DDS;  Location: [facility];  Service: Oral Surgery;  Laterality: N/A;  . OVARIAN CYST REMOVAL  2008   Allergies  Allergen Reactions  . Fruit & Vegetable Daily [Nutritional Supplements] Shortness Of Breath    Aloe  . Iodinated Diagnostic Agents Swelling    Swelling and itching of left side of her face only after CT SI injection(no steroid used)  . Ibuprofen Other (See Comments)    Stomach upset; However, pt can take Meloxicam without incident and takes this at home.   . Aspirin Rash and Other (See Comments)  Stomach upset   Current Outpatient Prescriptions  Medication Sig Dispense Refill  . amLODipine (NORVASC) 5 MG tablet TAKE 1 TABLET (5 MG TOTAL) BY MOUTH DAILY. HIGH BLOOD PRESSURE 30 tablet 2  . budesonide-formoterol (SYMBICORT) 80-4.5 MCG/ACT inhaler Inhale 2 puffs into the lungs daily as needed (for shortness of breath). 1 Inhaler 12  . citalopram (CELEXA) 10 MG tablet Take 1 tablet (10 mg total) by mouth daily. For depression 30 tablet 1  . conjugated estrogens (PREMARIN) vaginal cream Place 1 Applicatorful vaginally daily. 30 g 12  .  ferrous sulfate 325 (65 FE) MG tablet Take 1 tablet (325 mg total) by mouth daily with breakfast. 90 tablet 1  . fluticasone (FLONASE) 50 MCG/ACT nasal spray Place 2 sprays into both nostrils daily. 16 g 1  . ipratropium-albuterol (DUONEB) 0.5-2.5 (3) MG/3ML SOLN Take 3 mLs by nebulization every 4 (four) hours as needed (shortness of breath). Reported on 12/02/2015    . LamoTRIgine (LAMICTAL XR) 25 MG TB24 tablet take 1 tablet daily for 2 weeks, then increase to 2 tablets daily for 2 weeks, then increase to 4 tablets daily then see me in follow-up (Patient taking differently: 100 mg at bedtime. take 1 tablet daily for 2 weeks, then increase to 2 tablets daily for 2 weeks, then increase to 4 tablets daily then see me in follow-up) 120 tablet 0  . methocarbamol (ROBAXIN) 750 MG tablet Take 1 tablet (750 mg total) by mouth every 8 (eight) hours as needed for muscle spasms. 60 tablet 0  . naproxen (NAPROSYN) 500 MG tablet Take 1 tablet (500 mg total) by mouth 2 (two) times daily with a meal. 180 tablet 0  . oxyCODONE-acetaminophen (PERCOCET/ROXICET) 5-325 MG tablet Take 1 tablet by mouth every 8 (eight) hours as needed for severe pain. 30 tablet 0  . QUEtiapine (SEROQUEL) 300 MG tablet Take 1 tablet (300 mg total) by mouth at bedtime. Mood control 30 tablet 1  . valACYclovir (VALTREX) 1000 MG tablet Take 2 tabs po at symptom onset, repeat in 12 hours 20 tablet 3  . VENTOLIN HFA 108 (90 Base) MCG/ACT inhaler INHALE TWO PUFFS BY MOUTH EVERY 6 HOURS AS NEEDED FOR SHORTNESS OF BREATH 18 each 2  . oxyCODONE (OXY IR/ROXICODONE) 5 MG immediate release tablet Take 1 tablet (5 mg total) by mouth every 6 (six) hours as needed for severe pain. 60 tablet 0  . triamcinolone cream (KENALOG) 0.1 % Apply 1 application topically 2 (two) times daily. 30 g 0   No current facility-administered medications for this visit.    Social History   Social History  . Marital status: Widowed    Spouse name: N/A  . Number of  children: 1  . Years of education: N/A   Occupational History  . disabled     mental illness; seizures   Social History Main Topics  . Smoking status: Current Every Day Smoker    Packs/day: 0.33    Years: 44.00    Types: Cigarettes  . Smokeless tobacco: Never Used  . Alcohol use No     Comment: occasionally  . Drug use: No     Comment: 06-03-16- pt states she has not smoke marijuana for 3 years  . Sexual activity: No     Comment: widow   Other Topics Concern  . Not on file   Social History Narrative   Marital status:  Widowed since 2002.  Married x 16 years. + dating.  Moved from [place] to live with daughter  in 2013.     Children:  One child/daughter (33); two grandchildren.      Lives: with daughter, grandchildren 2.  Does not drive due to epilepsy.      Employment:  Disability for schizoaffective disorder.      Tobacco: 1 ppd x since 8th grade.      Alcohol:  Social; rare drinking due to seizure medications.  Weekends.       Drugs: none; previous use of marijuana.  Previous iv drug use, cocaine.      Exercise: none      Seatbelt:  100%      Guns: none   Family History  Problem Relation Age of Onset  . Diabetes type II Mother   . Hypertension Mother   . Arthritis Mother   . Diabetes Mother   . Mental illness Mother     bipolar  . Diabetes type II Maternal Aunt   . Hypertension Father   . Heart murmur Father   . Arthritis Father   . Stroke Father   . Alopecia Sister   . Alopecia Brother   . Alopecia Sister   . Mental retardation Sister     depression  . Cancer Maternal Uncle   . Colon cancer Neg Hx   . Esophageal cancer Neg Hx   . Rectal cancer Neg Hx   . Stomach cancer Neg Hx        Objective:    BP 112/62   Pulse 73   Temp 98.6 F (37 C) (Oral)   Ht 5' 3.5" (1.613 m)   Wt 119 lb (54 kg)   SpO2 97%   BMI 20.75 kg/m   Physical Exam  Constitutional: She is oriented to person, place, and time. She appears well-developed and well-nourished.  No distress.  HENT:  Head: Normocephalic and atraumatic.  Right Ear: External ear normal.  Left Ear: External ear normal.  Nose: Nose normal.  Mouth/Throat: Oropharynx is clear and moist.  Eyes: Conjunctivae and EOM are normal. Pupils are equal, round, and reactive to light.  Neck: Normal range of motion. Neck supple. Carotid bruit is not present. No thyromegaly present.  Cardiovascular: Normal rate, regular rhythm, normal heart sounds and intact distal pulses.  Exam reveals no gallop and no friction rub.   No murmur heard. Pulmonary/Chest: Effort normal and breath sounds normal. She has no wheezes. She has no rales.  Abdominal: Soft. Bowel sounds are normal. She exhibits no distension and no mass. There is no tenderness. There is no rebound and no guarding.  Musculoskeletal:       Lumbar back: She exhibits pain. She exhibits normal range of motion, no tenderness and no spasm.  Lymphadenopathy:    She has no cervical adenopathy.  Neurological: She is alert and oriented to person, place, and time. No cranial nerve deficit.  Skin: Skin is warm and dry. No rash noted. She is not diaphoretic. No erythema. No pallor.  Scattered nevi along lower extremities with slight irregular borders. Hyperpigmentation along skin of B elbows extensor surfaces.  Psychiatric: She has a normal mood and affect. Her behavior is normal.   Results for orders placed or performed in visit on 06/30/16  Urine culture  Result Value Ref Range   Organism ID, Bacteria NO GROWTH   GC/Chlamydia Probe Amp  Result Value Ref Range   CT Probe RNA NOT DETECTED    GC Probe RNA NOT DETECTED   POCT urinalysis dipstick  Result Value Ref Range   Color,  UA yellow yellow   Clarity, UA cloudy (A) clear   Glucose, UA negative negative   Bilirubin, UA negative negative   Ketones, POC UA negative negative   Spec Grav, UA 1.015    Blood, UA trace-intact (A) negative   pH, UA 8.5    Protein Ur, POC negative negative    Urobilinogen, UA 0.2    Nitrite, UA Negative Negative   Leukocytes, UA Negative Negative  POCT Microscopic Urinalysis ([facility])  Result Value Ref Range   WBC,UR,HPF,POC Few (A) None WBC/hpf   RBC,UR,HPF,POC Few (A) None RBC/hpf   Bacteria Few (A) None, Too numerous to count   Mucus Present (A) Absent   Epithelial Cells, UR Per Microscopy Moderate (A) None, Too numerous to count cells/hpf       Assessment & Plan:   1. Schizoaffective disorder, bipolar type (HCC)   2. Nevus   3. Dermatitis   4. Dysuria   5. Pain of back and right lower extremity     Orders Placed This Encounter  Procedures  . Urine culture    Order Specific Question:   Source    Answer:   clean catch urine  . GC/Chlamydia Probe Amp    Order Specific Question:   Source    Answer:   clean catch urine  . Ambulatory referral to Dermatology    Referral Priority:   Routine    Referral Type:   Consultation    Referral Reason:   Specialty Services Required    Requested Specialty:   Dermatology    Number of Visits Requested:   1  . POCT urinalysis dipstick  . POCT Microscopic Urinalysis ([facility])   Meds ordered this encounter  Medications  . triamcinolone cream (KENALOG) 0.1 %    Sig: Apply 1 application topically 2 (two) times daily.    Dispense:  30 g    Refill:  0  . oxyCODONE-acetaminophen (PERCOCET/ROXICET) 5-325 MG tablet    Sig: Take 1 tablet by mouth every 8 (eight) hours as needed for severe pain.    Dispense:  30 tablet    Refill:  0  . naproxen (NAPROSYN) 500 MG tablet    Sig: Take 1 tablet (500 mg total) by mouth 2 (two) times daily with a meal.    Dispense:  180 tablet    Refill:  0    No Follow-up on file.    Shyloh Derosa Paulita Fujita, M.D. [facility] [address] [place], [place]  [address] [phone] phone [phone] fax

## 2016-07-01 ENCOUNTER — Other Ambulatory Visit: Payer: Self-pay | Admitting: Orthopedic Surgery

## 2016-07-01 DIAGNOSIS — M5416 Radiculopathy, lumbar region: Secondary | ICD-10-CM

## 2016-07-02 LAB — GC/CHLAMYDIA PROBE AMP
CT Probe RNA: NOT DETECTED
GC PROBE AMP APTIMA: NOT DETECTED

## 2016-07-02 LAB — URINE CULTURE: ORGANISM ID, BACTERIA: NO GROWTH

## 2016-07-05 ENCOUNTER — Ambulatory Visit (INDEPENDENT_AMBULATORY_CARE_PROVIDER_SITE_OTHER): Payer: PRIVATE HEALTH INSURANCE | Admitting: Licensed Clinical Social Worker

## 2016-07-05 DIAGNOSIS — F250 Schizoaffective disorder, bipolar type: Secondary | ICD-10-CM

## 2016-07-05 NOTE — Progress Notes (Signed)
   THERAPIST PROGRESS NOTE  Session Time: 3:15-4:00 pm  Participation Level: Active  Behavioral Response: NeatAlertAnxious  Type of Therapy: Individual Therapy  Treatment Goals addressed: Anxiety and Coping  Interventions: CBT and Supportive  Summary: Melissa Bridges is a 55 y.o. female who presents with anxiety and depression.   Suicidal/Homicidal: Nowithout intent/plan  Therapist Response: Pt presented anxious today. She is looking for new housing. Pt currently lives with her daughter. She has stayed with her daughter for years but there are changes in family dynamics (daughter going back to college, grandkids going to college) so she would like to get a place of her own. She has applied to several low housing with no repsonse. Suggested to pt she call these places. Pt is having a lot of health issues and chronically goes to the doctor. I am still encouraging pt to go to IOP but she cannot make a commitment currently. Pt is taking her medication as prescribed. Pt made an appt with Dr. Adele Schilder for medication management. Discussed with pt her coping skills for her anxiety: breathing, progressive meditation. Practiced skills with pt.   Plan: Return again in 2 weeks.  Diagnosis: Axis I: F25.0    Axis II: None    Cross Jorge S, LCAS-A 07/05/2016

## 2016-07-06 ENCOUNTER — Encounter: Payer: Self-pay | Admitting: Gastroenterology

## 2016-07-08 ENCOUNTER — Other Ambulatory Visit: Payer: Self-pay

## 2016-07-12 ENCOUNTER — Ambulatory Visit
Admission: RE | Admit: 2016-07-12 | Discharge: 2016-07-12 | Disposition: A | Discharge status: Home / Self Care | Payer: Medicare Other | Source: Ambulatory Visit | Attending: Orthopedic Surgery | Admitting: Orthopedic Surgery

## 2016-07-12 DIAGNOSIS — M5416 Radiculopathy, lumbar region: Secondary | ICD-10-CM

## 2016-07-13 ENCOUNTER — Telehealth: Payer: Self-pay | Admitting: Radiology

## 2016-07-13 NOTE — Telephone Encounter (Signed)
Pt had CT injection 07/12/16 of SI joint. Pt states as soon as she left the building the left side of her face started to swell and itch. Asked pt why she did not come back in the building immediately and she stated she thought it was a side effect of the injection. No problems breathing and pt is taking her own benadryl. Told her to take the benadryl every 6 hours until all itching goes away.  If any of her symptoms get worse to go to the ER. Will let Dr. Barbie Banner know as soon as he gets in the building.

## 2016-07-15 ENCOUNTER — Telehealth (HOSPITAL_COMMUNITY): Payer: Self-pay | Admitting: Licensed Clinical Social Worker

## 2016-07-19 ENCOUNTER — Encounter (HOSPITAL_COMMUNITY): Payer: Self-pay | Admitting: Licensed Clinical Social Worker

## 2016-07-19 ENCOUNTER — Ambulatory Visit (INDEPENDENT_AMBULATORY_CARE_PROVIDER_SITE_OTHER): Payer: PRIVATE HEALTH INSURANCE | Admitting: Licensed Clinical Social Worker

## 2016-07-19 DIAGNOSIS — F250 Schizoaffective disorder, bipolar type: Secondary | ICD-10-CM

## 2016-07-19 NOTE — Progress Notes (Signed)
   THERAPIST PROGRESS NOTE  Session Time: 3:10-4:00  Participation Level: Active  Behavioral Response: CasualAlertAnxious  Type of Therapy: Individual Therapy  Treatment Goals addressed: Coping  Interventions: CBT and Supportive  Summary: Melissa Bridges is a 55 y.o. female. Pt reports she is using her coping skills to deal with her anxiety. She is using meditation and breathing exercises. Pt has made an appt with [facility] for assistance. She is also working with low-iincome housing. She requested a copy of her CCA for her housing application.Worked with pt on her skills necessary for dealing with her grandchildren. Pt becomes frustrated and angry. Role played anger management skills. DIscussed with pt how her thoughts don't have to turn into actions. Pt is glad her granddaughters are going back to college which will help in dealing with her emotions. .   Suicidal/Homicidal: Nowithout intent/plan  Therapist Response: Assessed pt's current functioning and reviewed progress. Assisted pt in processing issues with her grandchildren, anger management skills and management of stressors.                                   Plan: Return again in 2weeks.  Diagnosis: Axis I: Schitzoaffective disorder, bipolar type    Axis II: No DX    MACKENZIE,LISBETH S, LCAS-A 07/19/2016

## 2016-07-21 ENCOUNTER — Ambulatory Visit (HOSPITAL_COMMUNITY): Payer: Self-pay | Admitting: Psychiatry

## 2016-08-02 ENCOUNTER — Ambulatory Visit (INDEPENDENT_AMBULATORY_CARE_PROVIDER_SITE_OTHER): Payer: Medicare Other | Admitting: Family Medicine

## 2016-08-02 ENCOUNTER — Ambulatory Visit (HOSPITAL_COMMUNITY): Payer: Self-pay | Admitting: Licensed Clinical Social Worker

## 2016-08-02 VITALS — BP 118/76 | HR 83 | Temp 98.5°F | Resp 16 | Ht 63.5 in | Wt 117.4 lb

## 2016-08-02 DIAGNOSIS — J4541 Moderate persistent asthma with (acute) exacerbation: Secondary | ICD-10-CM | POA: Diagnosis not present

## 2016-08-02 DIAGNOSIS — B182 Chronic viral hepatitis C: Secondary | ICD-10-CM

## 2016-08-02 DIAGNOSIS — G40009 Localization-related (focal) (partial) idiopathic epilepsy and epileptic syndromes with seizures of localized onset, not intractable, without status epilepticus: Secondary | ICD-10-CM

## 2016-08-02 DIAGNOSIS — M5441 Lumbago with sciatica, right side: Secondary | ICD-10-CM | POA: Diagnosis not present

## 2016-08-02 DIAGNOSIS — M5126 Other intervertebral disc displacement, lumbar region: Secondary | ICD-10-CM | POA: Diagnosis not present

## 2016-08-02 DIAGNOSIS — I10 Essential (primary) hypertension: Secondary | ICD-10-CM

## 2016-08-02 MED ORDER — OXYCODONE HCL 5 MG PO TABS
5.0000 mg | ORAL_TABLET | Freq: Four times a day (QID) | ORAL | 0 refills | Status: DC | PRN
Start: 2016-08-02 — End: 2016-09-04

## 2016-08-02 NOTE — Progress Notes (Signed)
Subjective:  By signing my name below, I, Raven Small, attest that this documentation has been prepared under the direction and in the presence of Nilda Simmer, MD.  Electronically Signed: Andrew Au, ED Scribe. 08/02/2016. 9:24 PM.  Patient ID: Melissa Bridges, female    DOB: Apr 09, 1961, 55 y.o.   MRN: [id number]  08/02/2016  Back Pain (bilateral lower back pain x 2 weeks )  HPI   Chief Complaint  Patient presents with  . Back Pain    bilateral lower back pain x 2 weeks    Melissa Bridges is a 55 y.o. female with hx sciatic pain, schizoaffective disorder, arthritis,  who presents to the [facility] for a medication refill. Pt reports chronic bilateral low back pain. She had an SI injection by Dr. Yevette Edwards, 7/31 in lower back but states had terrible reaction afterwards. States she started having swelling on left side of face that moved into neck.  She has not had a f/u appointment with Dr. Yevette Edwards. She would like have to have surgery. Pt states her daughter, who is her transportation, went to school and has trouble making it to appointments so had to reschedule many. She take pain medication as needed, usually with long trips, when moving furniture/ heavy object. She has continued seeing a psychologist, which has been going well. She has not had changes in medication.  Pt has allergies. She has nasal spray and zyrtec. She uses Symbicort daily. She has an appointment with dermatology, 8/31.  Pt is not drinking alcohol.   Review of Systems  Constitutional: Negative for chills, diaphoresis, fatigue and fever.  Eyes: Negative for visual disturbance.  Respiratory: Negative for cough, shortness of breath and wheezing.   Cardiovascular: Negative for chest pain, palpitations and leg swelling.  Gastrointestinal: Negative for abdominal pain, constipation, diarrhea, nausea and vomiting.  Endocrine: Negative for cold intolerance, heat intolerance, polydipsia, polyphagia and  polyuria.  Musculoskeletal: Positive for back pain.  Neurological: Negative for dizziness, tremors, seizures, syncope, facial asymmetry, speech difficulty, weakness, light-headedness, numbness and headaches.  Psychiatric/Behavioral: Positive for dysphoric mood. Negative for self-injury, sleep disturbance and suicidal ideas. The patient is nervous/anxious.      Past Medical History:  Diagnosis Date  . Allergy   . Alopecia   . Anxiety   . Arthritis   . Asthma   . Cataract    "mild"  . COPD (chronic obstructive pulmonary disease) (HCC)   . Depression   . GERD (gastroesophageal reflux disease)    o cc- takes OTC if needed.  . Glaucoma   . Hepatitis C   . Hypertension    onset age 20.  . Iron deficiency anemia   . Migraine   . Schizoaffective disorder   . Schizoaffective disorder (HCC)   . Sciatic pain   . Seizures (HCC)    onset in childhood. 06-03-16- per pt 8 months agoGeneralized tonic-clonic.Last seizure couple of months ago  . Shortness of breath dyspnea   . Substance abuse    Past Surgical History:  Procedure Laterality Date  . ABDOMINAL HYSTERECTOMY     ovarian cyst B, cervical dysplasia, fibroids.   Ovaries intact.  . COLONOSCOPY W/ BIOPSIES    . DILATION AND CURETTAGE OF UTERUS     x2  . EXPLORATORY LAPAROTOMY     x3  . GYNECOLOGIC CRYOSURGERY     x3  . MANDIBLE RECONSTRUCTION N/A 06/27/2015   Procedure: REMOVAL MAXILLARY PALATAL TORUS.  REMOVAL MANDIBULAR TORUS AND EXOSTOSIS.  ;  Surgeon: Ocie Doyne, DDS;  Location: [facility] OR;  Service: Oral Surgery;  Laterality: N/A;  . OVARIAN CYST REMOVAL  2008   Allergies  Allergen Reactions  . Fruit & Vegetable Daily [Nutritional Supplements] Shortness Of Breath    Aloe  . Iodinated Diagnostic Agents Swelling    Swelling and itching of left side of her face only after CT SI injection(no steroid used)  . Ibuprofen Other (See Comments)    Stomach upset; However, pt can take Meloxicam without incident and takes this at home.    . Aspirin Rash and Other (See Comments)    Stomach upset    Social History   Social History  . Marital status: Widowed    Spouse name: N/A  . Number of children: 1  . Years of education: N/A   Occupational History  . disabled     mental illness; seizures   Social History Main Topics  . Smoking status: Current Every Day Smoker    Packs/day: 0.33    Years: 44.00    Types: Cigarettes  . Smokeless tobacco: Never Used  . Alcohol use No     Comment: occasionally  . Drug use: No     Comment: 06-03-16- pt states she has not smoke marijuana for 3 years  . Sexual activity: No     Comment: widow   Other Topics Concern  . Not on file   Social History Narrative   Marital status:  Widowed since 2002.  Married x 16 years. + dating.  Moved from [place] to live with daughter in 2013.     Children:  One child/daughter (33); two grandchildren.      Lives: with daughter, grandchildren 2.  Does not drive due to epilepsy.      Employment:  Disability for schizoaffective disorder.      Tobacco: 1 ppd x since 8th grade.      Alcohol:  Social; rare drinking due to seizure medications.  Weekends.       Drugs: none; previous use of marijuana.  Previous iv drug use, cocaine.      Exercise: none      Seatbelt:  100%      Guns: none   Family History  Problem Relation Age of Onset  . Diabetes type II Mother   . Hypertension Mother   . Arthritis Mother   . Diabetes Mother   . Mental illness Mother     bipolar  . Diabetes type II Maternal Aunt   . Hypertension Father   . Heart murmur Father   . Arthritis Father   . Stroke Father   . Alopecia Sister   . Alopecia Brother   . Alopecia Sister   . Mental retardation Sister     depression  . Cancer Maternal Uncle   . Colon cancer Neg Hx   . Esophageal cancer Neg Hx   . Rectal cancer Neg Hx   . Stomach cancer Neg Hx    Objective:    BP 118/76 (BP Location: Right Arm, Patient Position: Sitting, Cuff Size: Normal)   Pulse 83    Temp 98.5 F (36.9 C) (Oral)   Resp 16   Ht 5' 3.5" (1.613 m)   Wt 117 lb 6.4 oz (53.3 kg)   SpO2 99%   BMI 20.47 kg/m  Physical Exam  Constitutional: She is oriented to person, place, and time. She appears well-developed and well-nourished. No distress.  HENT:  Head: Normocephalic and atraumatic.  Eyes: Conjunctivae and EOM are  normal. Pupils are equal, round, and reactive to light.  Neck: Normal range of motion. Neck supple. No thyromegaly present.  Cardiovascular: Normal rate, regular rhythm and normal heart sounds.  Exam reveals no friction rub.   No murmur heard. Pulmonary/Chest: Effort normal and breath sounds normal. No respiratory distress. She has no wheezes. She has no rales.  Abdominal: Soft. There is no tenderness.  Musculoskeletal: Normal range of motion.       Lumbar back: She exhibits pain. She exhibits normal range of motion, no tenderness, no bony tenderness, no spasm and normal pulse.  Lymphadenopathy:    She has no cervical adenopathy.  Neurological: She is alert and oriented to person, place, and time.  Skin: Skin is warm and dry.  Psychiatric: She has a normal mood and affect. Her behavior is normal.  Nursing note and vitals reviewed.  Assessment & Plan:   1. Lumbar disc herniation   2. Right-sided low back pain with right-sided sciatica   3. Essential hypertension   4. Asthma with acute exacerbation, moderate persistent   5. Localization-related idiopathic epilepsy and epileptic syndromes with seizures of localized onset, not intractable, without status epilepticus (HCC)   6. Chronic hepatitis C without hepatic coma (HCC)    -stable. -refill of oxycodone provided; will avoid further acetaminophen due to Hepatitis C status. -improving compliance with specialist.   No orders of the defined types were placed in this encounter.  Meds ordered this encounter  Medications  . oxyCODONE (OXY IR/ROXICODONE) 5 MG immediate release tablet    Sig: Take 1 tablet  (5 mg total) by mouth every 6 (six) hours as needed for severe pain.    Dispense:  60 tablet    Refill:  0    Return in about 2 months (around 10/02/2016) for recheck.   I personally performed the services described in this documentation, which was scribed in my presence. The recorded information has been reviewed and considered.  Nicole Defino Paulita Fujita, M.D. [facility] [address] [place], [place]  [address] [phone] phone [phone] fax

## 2016-08-02 NOTE — Patient Instructions (Signed)
     IF you received an x-ray today, you will receive an invoice from [facility]. Please contact [facility] at [phone] with questions or concerns regarding your invoice.   IF you received labwork today, you will receive an invoice from [facility]. Please contact [facility] at [phone] with questions or concerns regarding your invoice.   Our billing staff will not be able to assist you with questions regarding bills from these companies.  You will be contacted with the lab results as soon as they are available. The fastest way to get your results is to activate your My Chart account. Instructions are located on the last page of this paperwork. If you have not heard from us regarding the results in 2 weeks, please contact this office.     We recommend that you schedule a mammogram for breast cancer screening. Typically, you do not need a referral to do this. Please contact a local imaging center to schedule your mammogram.  [facility] - [phone]  *ask for the Radiology Department [facility]) - [phone] or [phone]  [facility] - [phone] [facility] - [phone] [facility] - [phone]  *ask for the Radiology Department [facility] - [phone]  *ask for the Radiology Department [facility] - [phone]  *ask for the Mammography Department [facility] - [phone]  

## 2016-08-05 ENCOUNTER — Ambulatory Visit (HOSPITAL_COMMUNITY): Payer: Self-pay | Admitting: Psychiatry

## 2016-08-05 DIAGNOSIS — D126 Benign neoplasm of colon, unspecified: Secondary | ICD-10-CM | POA: Insufficient documentation

## 2016-08-06 ENCOUNTER — Ambulatory Visit (HOSPITAL_COMMUNITY): Payer: Self-pay | Admitting: Psychiatry

## 2016-08-23 DIAGNOSIS — M533 Sacrococcygeal disorders, not elsewhere classified: Secondary | ICD-10-CM | POA: Diagnosis not present

## 2016-09-01 ENCOUNTER — Ambulatory Visit: Payer: Medicare Other | Admitting: Family Medicine

## 2016-09-02 DIAGNOSIS — H25013 Cortical age-related cataract, bilateral: Secondary | ICD-10-CM | POA: Diagnosis not present

## 2016-09-02 DIAGNOSIS — H40023 Open angle with borderline findings, high risk, bilateral: Secondary | ICD-10-CM | POA: Diagnosis not present

## 2016-09-02 DIAGNOSIS — H2513 Age-related nuclear cataract, bilateral: Secondary | ICD-10-CM | POA: Diagnosis not present

## 2016-09-02 DIAGNOSIS — H43393 Other vitreous opacities, bilateral: Secondary | ICD-10-CM | POA: Diagnosis not present

## 2016-09-03 ENCOUNTER — Ambulatory Visit
Admission: RE | Admit: 2016-09-03 | Discharge: 2016-09-03 | Disposition: A | Discharge status: Home / Self Care | Payer: Medicare Other | Source: Ambulatory Visit | Attending: Family Medicine | Admitting: Family Medicine

## 2016-09-03 DIAGNOSIS — Z1231 Encounter for screening mammogram for malignant neoplasm of breast: Secondary | ICD-10-CM | POA: Diagnosis not present

## 2016-09-04 ENCOUNTER — Ambulatory Visit (INDEPENDENT_AMBULATORY_CARE_PROVIDER_SITE_OTHER): Payer: Medicare Other | Admitting: Family Medicine

## 2016-09-04 VITALS — BP 124/80 | HR 98 | Temp 98.4°F | Resp 17 | Ht 63.5 in | Wt 116.0 lb

## 2016-09-04 DIAGNOSIS — Z23 Encounter for immunization: Secondary | ICD-10-CM | POA: Diagnosis not present

## 2016-09-04 DIAGNOSIS — D126 Benign neoplasm of colon, unspecified: Secondary | ICD-10-CM

## 2016-09-04 DIAGNOSIS — J441 Chronic obstructive pulmonary disease with (acute) exacerbation: Secondary | ICD-10-CM

## 2016-09-04 DIAGNOSIS — I10 Essential (primary) hypertension: Secondary | ICD-10-CM | POA: Diagnosis not present

## 2016-09-04 DIAGNOSIS — M5126 Other intervertebral disc displacement, lumbar region: Secondary | ICD-10-CM

## 2016-09-04 DIAGNOSIS — G894 Chronic pain syndrome: Secondary | ICD-10-CM

## 2016-09-04 DIAGNOSIS — M5441 Lumbago with sciatica, right side: Secondary | ICD-10-CM | POA: Diagnosis not present

## 2016-09-04 DIAGNOSIS — J4541 Moderate persistent asthma with (acute) exacerbation: Secondary | ICD-10-CM | POA: Diagnosis not present

## 2016-09-04 DIAGNOSIS — B001 Herpesviral vesicular dermatitis: Secondary | ICD-10-CM | POA: Diagnosis not present

## 2016-09-04 LAB — CBC WITH DIFFERENTIAL/PLATELET
BASOS ABS: 0 {cells}/uL (ref 0–200)
Basophils Relative: 0 %
EOS PCT: 2 %
Eosinophils Absolute: 156 cells/uL (ref 15–500)
HEMATOCRIT: 36.8 % (ref 35.0–45.0)
HEMOGLOBIN: 12 g/dL (ref 11.7–15.5)
LYMPHS ABS: 1482 {cells}/uL (ref 850–3900)
LYMPHS PCT: 19 %
MCH: 32.6 pg (ref 27.0–33.0)
MCHC: 32.6 g/dL (ref 32.0–36.0)
MCV: 100 fL (ref 80.0–100.0)
MONO ABS: 858 {cells}/uL (ref 200–950)
MPV: 9.8 fL (ref 7.5–12.5)
Monocytes Relative: 11 %
NEUTROS PCT: 68 %
Neutro Abs: 5304 cells/uL (ref 1500–7800)
Platelets: 369 10*3/uL (ref 140–400)
RBC: 3.68 MIL/uL — ABNORMAL LOW (ref 3.80–5.10)
RDW: 15.7 % — ABNORMAL HIGH (ref 11.0–15.0)
WBC: 7.8 10*3/uL (ref 3.8–10.8)

## 2016-09-04 LAB — COMPREHENSIVE METABOLIC PANEL
ALBUMIN: 4.5 g/dL (ref 3.6–5.1)
ALT: 63 U/L — ABNORMAL HIGH (ref 6–29)
AST: 101 U/L — ABNORMAL HIGH (ref 10–35)
Alkaline Phosphatase: 79 U/L (ref 33–130)
BUN: 7 mg/dL (ref 7–25)
CALCIUM: 9.9 mg/dL (ref 8.6–10.4)
CHLORIDE: 102 mmol/L (ref 98–110)
CO2: 27 mmol/L (ref 20–31)
Creat: 0.77 mg/dL (ref 0.50–1.05)
Glucose, Bld: 107 mg/dL — ABNORMAL HIGH (ref 65–99)
Potassium: 4.2 mmol/L (ref 3.5–5.3)
Sodium: 137 mmol/L (ref 135–146)
Total Bilirubin: 0.5 mg/dL (ref 0.2–1.2)
Total Protein: 8.1 g/dL (ref 6.1–8.1)

## 2016-09-04 MED ORDER — TIZANIDINE HCL 2 MG PO CAPS: 2.0000 mg | ORAL_CAPSULE | Freq: Three times a day (TID) | ORAL | 2 refills | Status: DC | PRN

## 2016-09-04 MED ORDER — AMLODIPINE BESYLATE 5 MG PO TABS: 5.0000 mg | ORAL_TABLET | Freq: Every day | ORAL | 1 refills | Status: DC

## 2016-09-04 MED ORDER — OXYCODONE HCL 5 MG PO TABS: 5.0000 mg | ORAL_TABLET | Freq: Three times a day (TID) | ORAL | 0 refills | Status: DC | PRN

## 2016-09-04 MED ORDER — VALACYCLOVIR HCL 1 G PO TABS: ORAL_TABLET | ORAL | 3 refills | Status: DC

## 2016-09-04 MED ORDER — ALBUTEROL SULFATE HFA 108 (90 BASE) MCG/ACT IN AERS: INHALATION_SPRAY | RESPIRATORY_TRACT | 2 refills | Status: DC

## 2016-09-04 MED ORDER — ESTROGENS, CONJUGATED 0.625 MG/GM VA CREA: 1.0000 | TOPICAL_CREAM | Freq: Every day | VAGINAL | 12 refills | Status: DC

## 2016-09-04 MED ORDER — BUDESONIDE-FORMOTEROL FUMARATE 80-4.5 MCG/ACT IN AERO: 2.0000 | INHALATION_SPRAY | Freq: Every day | RESPIRATORY_TRACT | 4 refills | Status: DC | PRN

## 2016-09-04 MED ORDER — FERROUS SULFATE 325 (65 FE) MG PO TABS: 325.0000 mg | ORAL_TABLET | Freq: Every day | ORAL | 1 refills | Status: DC

## 2016-09-04 MED ORDER — NICOTINE 14 MG/24HR TD PT24: 14.0000 mg | MEDICATED_PATCH | Freq: Every day | TRANSDERMAL | 1 refills | Status: DC

## 2016-09-04 NOTE — Patient Instructions (Addendum)
     IF you received an x-ray today, you will receive an invoice from [facility]. Please contact [facility] at [phone] with questions or concerns regarding your invoice.   IF you received labwork today, you will receive an invoice from [facility]. Please contact [facility] at [phone] with questions or concerns regarding your invoice.   Our billing staff will not be able to assist you with questions regarding bills from these companies.  You will be contacted with the lab results as soon as they are available. The fastest way to get your results is to activate your My Chart account. Instructions are located on the last page of this paperwork. If you have not heard from us regarding the results in 2 weeks, please contact this office.     We recommend that you schedule a mammogram for breast cancer screening. Typically, you do not need a referral to do this. Please contact a local imaging center to schedule your mammogram.  [facility] - [phone]  *ask for the Radiology Department [facility] - [phone] or [phone]  [facility] - [phone] [facility] - [phone] [facility] - [phone]  *ask for the Radiology Department [facility] - [phone]  *ask for the Radiology Department [facility] - [phone]  *ask for the Mammography Department [facility] - [phone]  
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
## 2016-09-09 ENCOUNTER — Encounter: Payer: Self-pay | Admitting: Gastroenterology

## 2016-09-09 LAB — PAIN MGMT, PROFILE 8 W/CONF, U
6 Acetylmorphine: NEGATIVE ng/mL (ref <10)
AMPHETAMINES: NEGATIVE ng/mL (ref <500)
Alcohol Metabolites: POSITIVE ng/mL — AB (ref <500)
BENZODIAZEPINES: NEGATIVE ng/mL (ref <100)
Buprenorphine: NEGATIVE ng/mL (ref <5)
CODEINE: NEGATIVE ng/mL (ref <50)
Cocaine Metabolite: NEGATIVE ng/mL (ref <150)
Creatinine: 103.2 mg/dL (ref >=20.0)
ETHYL GLUCURONIDE (ETG): 2557 ng/mL — AB (ref <500)
ETHYL SULFATE (ETS): 3929 ng/mL — AB (ref <100)
HYDROCODONE: 177 ng/mL — AB (ref <50)
Hydromorphone: NEGATIVE ng/mL (ref <50)
MARIJUANA METABOLITE: 337 ng/mL — AB (ref <5)
MDMA: NEGATIVE ng/mL (ref <500)
Marijuana Metabolite: POSITIVE ng/mL — AB (ref <20)
Morphine: NEGATIVE ng/mL (ref <50)
NORHYDROCODONE: 215 ng/mL — AB (ref <50)
OXYCODONE: NEGATIVE ng/mL (ref <100)
Opiates: POSITIVE ng/mL — AB (ref <100)
Oxidant: NEGATIVE ug/mL (ref <200)
PLEASE NOTE: 0
pH: 7.75 (ref 4.5–9.0)

## 2016-09-15 ENCOUNTER — Encounter: Payer: Medicare Other | Admitting: Family Medicine

## 2016-10-05 ENCOUNTER — Other Ambulatory Visit: Payer: Self-pay | Admitting: Family Medicine

## 2016-10-12 ENCOUNTER — Ambulatory Visit (AMBULATORY_SURGERY_CENTER): Payer: Self-pay | Admitting: *Deleted

## 2016-10-12 VITALS — Ht 63.75 in | Wt 119.8 lb

## 2016-10-12 DIAGNOSIS — Z86010 Personal history of colonic polyps: Secondary | ICD-10-CM

## 2016-10-12 MED ORDER — NA SULFATE-K SULFATE-MG SULF 17.5-3.13-1.6 GM/177ML PO SOLN: 1.0000 | Freq: Once | ORAL | 0 refills | Status: AC

## 2016-10-12 NOTE — Progress Notes (Signed)
No egg or soy allergy known to patient  No issues with past sedation with any surgeries  or procedures, no intubation problems  No diet pills per patient No home 02 use per patient  No blood thinners per patient  Pt denies issues with constipation  No A fib or A flutter   

## 2016-10-26 ENCOUNTER — Encounter: Payer: Self-pay | Admitting: Gastroenterology

## 2016-10-26 ENCOUNTER — Ambulatory Visit (AMBULATORY_SURGERY_CENTER): Payer: Medicare Other | Admitting: Gastroenterology

## 2016-10-26 VITALS — BP 128/77 | HR 70 | Temp 97.3°F | Resp 14 | Ht 63.75 in | Wt 119.0 lb

## 2016-10-26 DIAGNOSIS — Z86010 Personal history of colonic polyps: Secondary | ICD-10-CM | POA: Diagnosis not present

## 2016-10-26 DIAGNOSIS — I10 Essential (primary) hypertension: Secondary | ICD-10-CM | POA: Diagnosis not present

## 2016-10-26 DIAGNOSIS — J449 Chronic obstructive pulmonary disease, unspecified: Secondary | ICD-10-CM | POA: Diagnosis not present

## 2016-10-26 DIAGNOSIS — R12 Heartburn: Secondary | ICD-10-CM

## 2016-10-26 MED ORDER — OMEPRAZOLE 40 MG PO CPDR: 40.0000 mg | DELAYED_RELEASE_CAPSULE | Freq: Every day | ORAL | 3 refills | Status: DC

## 2016-10-26 MED ORDER — SODIUM CHLORIDE 0.9 % IV SOLN: 500.0000 mL | INTRAVENOUS | Status: DC

## 2016-10-26 NOTE — Patient Instructions (Signed)
Discharge instructions given. Handout on hemorrhoids. Resume previous medications. YOU HAD AN ENDOSCOPIC PROCEDURE TODAY AT THE [facility]:   Refer to the procedure report that was given to you for any specific questions about what was found during the examination.  If the procedure report does not answer your questions, please call your gastroenterologist to clarify.  If you requested that your care partner not be given the details of your procedure findings, then the procedure report has been included in a sealed envelope for you to review at your convenience later.  YOU SHOULD EXPECT: Some feelings of bloating in the abdomen. Passage of more gas than usual.  Walking can help get rid of the air that was put into your GI tract during the procedure and reduce the bloating. If you had a lower endoscopy (such as a colonoscopy or flexible sigmoidoscopy) you may notice spotting of blood in your stool or on the toilet paper. If you underwent a bowel prep for your procedure, you may not have a normal bowel movement for a few days.  Please Note:  You might notice some irritation and congestion in your nose or some drainage.  This is from the oxygen used during your procedure.  There is no need for concern and it should clear up in a day or so.  SYMPTOMS TO REPORT IMMEDIATELY:   Following lower endoscopy (colonoscopy or flexible sigmoidoscopy):  Excessive amounts of blood in the stool  Significant tenderness or worsening of abdominal pains  Swelling of the abdomen that is new, acute  Fever of 100F or higher   For urgent or emergent issues, a gastroenterologist can be reached at any hour by calling [phone].   DIET:  We do recommend a small meal at first, but then you may proceed to your regular diet.  Drink plenty of fluids but you should avoid alcoholic beverages for 24 hours.  ACTIVITY:  You should plan to take it easy for the rest of today and you should NOT DRIVE or use heavy  machinery until tomorrow (because of the sedation medicines used during the test).    FOLLOW UP: Our staff will call the number listed on your records the next business day following your procedure to check on you and address any questions or concerns that you may have regarding the information given to you following your procedure. If we do not reach you, we will leave a message.  However, if you are feeling well and you are not experiencing any problems, there is no need to return our call.  We will assume that you have returned to your regular daily activities without incident.  If any biopsies were taken you will be contacted by phone or by letter within the next 1-3 weeks.  Please call us at [phone] if you have not heard about the biopsies in 3 weeks.    SIGNATURES/CONFIDENTIALITY: You and/or your care partner have signed paperwork which will be entered into your electronic medical record.  These signatures attest to the fact that that the information above on your After Visit Summary has been reviewed and is understood.  Full responsibility of the confidentiality of this discharge information lies with you and/or your care-partner. 

## 2016-10-26 NOTE — Progress Notes (Signed)
Patient awakening,vss,report to rn 

## 2016-10-26 NOTE — Op Note (Signed)
[place] Patient Name: Burniece Dishon Procedure Date: 10/26/2016 2:01 PM MRN: [id number] Endoscopist: Remo Lipps P. Armbruster MD, MD Age: 55 Referring MD:  Date of Birth: December 07, 1961 Gender: Female Account #: [id number] Procedure:                Flexible Sigmoidoscopy Indications:              High risk colon cancer surveillance: Personal                            history of colonic polyps - large sessile serrated                            adenoma removed from rectosigmoid in June (3cm) via                            piecemeal polypectomy, here for surveillance exam. Medicines:                Monitored Anesthesia Care Procedure:                Pre-Anesthesia Assessment:                           - Prior to the procedure, a History and Physical                            was performed, and patient medications and                            allergies were reviewed. The patient's tolerance of                            previous anesthesia was also reviewed. The risks                            and benefits of the procedure and the sedation                            options and risks were discussed with the patient.                            All questions were answered, and informed consent                            was obtained. Prior Anticoagulants: The patient has                            taken no previous anticoagulant or antiplatelet                            agents. ASA Grade Assessment: III - A patient with                            severe systemic disease. After reviewing the risks  and benefits, the patient was deemed in                            satisfactory condition to undergo the procedure.                           After obtaining informed consent, the scope was                            passed under direct vision. The Model PCF-H190L                            [id number]) scope was introduced through the anus           and advanced to the the sigmoid colon. The flexible                            sigmoidoscopy was accomplished without difficulty.                            The patient tolerated the procedure well. The                            quality of the bowel preparation was good. Scope In: 2:08:40 PM Scope Out: 2:14:45 PM Total Procedure Duration: 0 hours 6 minutes 5 seconds  Findings:                 The perianal and digital rectal examinations were                            normal.                           A large post polypectomy scar was found in the                            recto-sigmoid colon. The scar tissue was healthy in                            appearance. There was no evidence of the previous                            polyp. Tattoo was noted inferior to the polypectomy                            site with a small diverticulum noted.                           Internal hemorrhoids were found during                            retroflexion. The hemorrhoids were small.                           The exam was otherwise without abnormality. Complications:  No immediate complications. Estimated blood loss:                            None. Estimated Blood Loss:     Estimated blood loss: none. Impression:               - Post-polypectomy scar in the recto-sigmoid colon                            - no evidence of residual polyp.                           - Small internal hemorrhoids.                           - The examination was otherwise normal. Recommendation:           - Discharge patient to home.                           - Resume previous diet.                           - Continue present medications.                           - Repeat flexible sigmoidoscopy in 3 years for                            surveillance. Remo Lipps P. Armbruster MD, MD 10/26/2016 2:20:53 PM This report has been signed electronically.

## 2016-10-27 ENCOUNTER — Telehealth: Payer: Self-pay | Admitting: *Deleted

## 2016-10-27 NOTE — Telephone Encounter (Signed)
  Follow up Call-  Call back number 10/26/2016 06/03/2016  Post procedure Call Back phone  # [phone]  Permission to leave phone message Yes Yes  Some recent data might be hidden     Patient questions:  Do you have a fever, pain , or abdominal swelling? No. Pain Score  0 *  Have you tolerated food without any problems? Yes.    Have you been able to return to your normal activities? Yes.    Do you have any questions about your discharge instructions: Diet   No. Medications  No. Follow up visit  No.  Do you have questions or concerns about your Care? No.  Actions: * If pain score is 4 or above: No action needed, pain <4.

## 2016-11-23 ENCOUNTER — Ambulatory Visit: Payer: Self-pay | Admitting: Family Medicine

## 2016-12-02 DIAGNOSIS — D485 Neoplasm of uncertain behavior of skin: Secondary | ICD-10-CM | POA: Diagnosis not present

## 2016-12-02 DIAGNOSIS — L821 Other seborrheic keratosis: Secondary | ICD-10-CM | POA: Diagnosis not present

## 2016-12-02 DIAGNOSIS — L400 Psoriasis vulgaris: Secondary | ICD-10-CM | POA: Diagnosis not present

## 2016-12-02 DIAGNOSIS — L235 Allergic contact dermatitis due to other chemical products: Secondary | ICD-10-CM | POA: Diagnosis not present

## 2016-12-29 ENCOUNTER — Ambulatory Visit: Payer: Medicare Other | Admitting: Family Medicine

## 2017-01-03 ENCOUNTER — Encounter (HOSPITAL_BASED_OUTPATIENT_CLINIC_OR_DEPARTMENT_OTHER): Payer: Self-pay

## 2017-01-03 ENCOUNTER — Emergency Department (HOSPITAL_BASED_OUTPATIENT_CLINIC_OR_DEPARTMENT_OTHER): Payer: Medicare Other

## 2017-01-03 ENCOUNTER — Emergency Department (HOSPITAL_BASED_OUTPATIENT_CLINIC_OR_DEPARTMENT_OTHER)
Admission: EM | Admit: 2017-01-03 | Discharge: 2017-01-03 | Disposition: A | Discharge status: Home / Self Care | Payer: Medicare Other | Attending: Emergency Medicine | Admitting: Emergency Medicine

## 2017-01-03 DIAGNOSIS — R350 Frequency of micturition: Secondary | ICD-10-CM | POA: Diagnosis not present

## 2017-01-03 DIAGNOSIS — J45909 Unspecified asthma, uncomplicated: Secondary | ICD-10-CM | POA: Insufficient documentation

## 2017-01-03 DIAGNOSIS — F17210 Nicotine dependence, cigarettes, uncomplicated: Secondary | ICD-10-CM | POA: Insufficient documentation

## 2017-01-03 DIAGNOSIS — J449 Chronic obstructive pulmonary disease, unspecified: Secondary | ICD-10-CM | POA: Diagnosis not present

## 2017-01-03 DIAGNOSIS — M5416 Radiculopathy, lumbar region: Secondary | ICD-10-CM

## 2017-01-03 DIAGNOSIS — M79604 Pain in right leg: Secondary | ICD-10-CM

## 2017-01-03 DIAGNOSIS — M5441 Lumbago with sciatica, right side: Secondary | ICD-10-CM

## 2017-01-03 DIAGNOSIS — R34 Anuria and oliguria: Secondary | ICD-10-CM | POA: Diagnosis not present

## 2017-01-03 DIAGNOSIS — I10 Essential (primary) hypertension: Secondary | ICD-10-CM | POA: Diagnosis not present

## 2017-01-03 DIAGNOSIS — M549 Dorsalgia, unspecified: Secondary | ICD-10-CM

## 2017-01-03 DIAGNOSIS — R1030 Lower abdominal pain, unspecified: Secondary | ICD-10-CM | POA: Diagnosis not present

## 2017-01-03 DIAGNOSIS — M545 Low back pain: Secondary | ICD-10-CM | POA: Diagnosis present

## 2017-01-03 LAB — URINALYSIS, ROUTINE W REFLEX MICROSCOPIC
Bilirubin Urine: NEGATIVE
Glucose, UA: NEGATIVE mg/dL
HGB URINE DIPSTICK: NEGATIVE
Ketones, ur: NEGATIVE mg/dL
LEUKOCYTES UA: NEGATIVE
Nitrite: NEGATIVE
PROTEIN: NEGATIVE mg/dL
SPECIFIC GRAVITY, URINE: 1.004 — AB (ref 1.005–1.030)
pH: 6.5 (ref 5.0–8.0)

## 2017-01-03 MED ORDER — METHOCARBAMOL 750 MG PO TABS: 750.0000 mg | ORAL_TABLET | Freq: Three times a day (TID) | ORAL | 0 refills | Status: DC | PRN

## 2017-01-03 MED ORDER — OXYCODONE-ACETAMINOPHEN 5-325 MG PO TABS
1.0000 | ORAL_TABLET | Freq: Once | ORAL | Status: AC
Start: 2017-01-03 — End: 2017-01-03
  Administered 2017-01-03: 1 via ORAL
  Filled 2017-01-03: qty 1

## 2017-01-03 MED ORDER — NAPROXEN 500 MG PO TABS: 500.0000 mg | ORAL_TABLET | Freq: Two times a day (BID) | ORAL | 0 refills | Status: DC

## 2017-01-03 MED ORDER — OXYCODONE-ACETAMINOPHEN 5-325 MG PO TABS: 1.0000 | ORAL_TABLET | Freq: Four times a day (QID) | ORAL | 0 refills | Status: DC | PRN

## 2017-01-03 NOTE — ED Notes (Signed)
Per radiology, [place] is finishing an outpt test; pt updated.

## 2017-01-03 NOTE — ED Provider Notes (Signed)
[facility] DEPT [facility] Provider Note   CSN: [id number] Arrival date & time: 01/03/17  1515  By signing my name below, I, Ephriam Jenkins, attest that this documentation has been prepared under the direction and in the presence of Blanchie Dessert, MD. Electronically signed, Ephriam Jenkins, ED Scribe. 01/03/17. 5:43 PM.  History   Chief Complaint Chief Complaint  Patient presents with  . Groin Pain    HPI HPI Comments: Melissa Bridges is a 56 y.o. female, with Hx of sciatica, hysterectomy, who presents to the Emergency Department complaining of worsening lower back pain that radiates circumferentially to her left inguinal area, onset 3 weeks ago. She also notes radiation of pain down her left lower extremity intermittently. Pt states that her pain is exacerbated when standing up and during exertion. She has frequent back pain due to sciatica but states that this does not feel similar. Pt is prescribed percocet and robaxin from Dr. Tamala Julian and last tried these medications 2 weeks ago with minimal relief. She does not take these medications daily. She has had normal BM's but reports increased urinary frequency for the past 3 days as well as urinary retention. LNMP was years ago. No noted rashes.   The history is provided by the patient. No language interpreter was used.    Past Medical History:  Diagnosis Date  . Allergy   . Alopecia   . Anxiety   . Arthritis   . Asthma   . Cataract    "mild"  . COPD (chronic obstructive pulmonary disease) ([place])   . Depression   . GERD (gastroesophageal reflux disease)    o cc- takes OTC if needed.  . Glaucoma   . Hepatitis C   . Hypertension    onset age 20.  . Iron deficiency anemia   . Migraine   . Schizoaffective disorder   . Schizoaffective disorder ([place])   . Sciatic pain   . Seizures ([place])    onset in childhood. 06-03-16- per pt 8 months agoGeneralized tonic-clonic.Last seizure couple of months ago- last sz 9-10 months ago per pt 10-12-2016  .  Shortness of breath dyspnea   . Substance abuse   . Ulcer [facility])     Patient Active Problem List   Diagnosis Date Noted  . Serrated adenoma of colon 08/05/2016  . Localization-related idiopathic epilepsy and epileptic syndromes with seizures of localized onset, not intractable, without status epilepticus ([place]) 10/16/2015  . 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 Elevated LFTs 09/09/2014  . Alcohol abuse 09/06/2014  . Asthma with acute exacerbation 03/09/2013  . Hypertension 03/09/2013  . Lower back pain 03/09/2013  . Chronic hepatitis C without hepatic coma ([place]) 03/08/2013  . Smoker 12/12/2012  . Schizoaffective disorder ([place]) 12/12/2012  . Substance abuse 11/14/2011    Past Surgical History:  Procedure Laterality Date  . ABDOMINAL HYSTERECTOMY     ovarian cyst B, cervical dysplasia, fibroids.   Ovaries intact.  . COLONOSCOPY    . COLONOSCOPY W/ BIOPSIES    . DILATION AND CURETTAGE OF UTERUS     x2  . EXPLORATORY LAPAROTOMY     x3  . GYNECOLOGIC CRYOSURGERY     x3  . MANDIBLE RECONSTRUCTION N/A 06/27/2015   Procedure: REMOVAL MAXILLARY PALATAL TORUS.  REMOVAL MANDIBULAR TORUS AND EXOSTOSIS.  ;  Surgeon: Diona Browner, DDS;  Location: [facility];  Service: Oral Surgery;  Laterality: N/A;  . OVARIAN CYST REMOVAL  2008  . POLYPECTOMY      OB History    No  data available       Home Medications    Prior to Admission medications   Medication Sig Start Date End Date Taking? Authorizing Provider  albuterol (VENTOLIN HFA) 108 (90 Base) MCG/ACT inhaler INHALE TWO PUFFS BY MOUTH EVERY 6 HOURS AS NEEDED FOR SHORTNESS OF BREATH 09/04/16   Wardell Honour, MD  amLODipine (NORVASC) 5 MG tablet Take 1 tablet (5 mg total) by mouth daily. 09/04/16   Wardell Honour, MD  budesonide-formoterol (SYMBICORT) 80-4.5 MCG/ACT inhaler Inhale 2 puffs into the lungs daily as needed (for shortness of breath). 09/04/16   Wardell Honour, MD  citalopram (CELEXA) 10 MG tablet Take 1 tablet (10 mg total) by mouth  daily. For depression 06/09/16   Kathlee Nations, MD  conjugated estrogens (PREMARIN) vaginal cream Place 1 Applicatorful vaginally daily. 09/04/16   Wardell Honour, MD  ferrous sulfate 325 (65 FE) MG tablet Take 1 tablet (325 mg total) by mouth daily with breakfast. 09/04/16   Wardell Honour, MD  fluticasone (FLONASE) 50 MCG/ACT nasal spray Place 2 sprays into both nostrils daily. Patient not taking: Reported on 10/26/2016 06/08/16   Dorian Heckle English, PA  ipratropium-albuterol (DUONEB) 0.5-2.5 (3) MG/3ML SOLN Take 3 mLs by nebulization every 4 (four) hours as needed (shortness of breath). Reported on 12/02/2015    Historical Provider, MD  LamoTRIgine (LAMICTAL XR) 25 MG TB24 tablet take 1 tablet daily for 2 weeks, then increase to 2 tablets daily for 2 weeks, then increase to 4 tablets daily then see me in follow-up Patient taking differently: 100 mg at bedtime. take 1 tablet daily for 2 weeks, then increase to 2 tablets daily for 2 weeks, then increase to 4 tablets daily then see me in follow-up 12/02/15   Cameron Sprang, MD  methocarbamol (ROBAXIN) 750 MG tablet Take 1 tablet (750 mg total) by mouth every 8 (eight) hours as needed for muscle spasms. Patient not taking: Reported on 10/26/2016 04/01/16   Wardell Honour, MD  naproxen (NAPROSYN) 500 MG tablet Take 1 tablet (500 mg total) by mouth 2 (two) times daily with a meal. 06/30/16   Wardell Honour, MD  nicotine (NICODERM CQ - DOSED IN MG/24 HOURS) 14 mg/24hr patch Place 1 patch (14 mg total) onto the skin daily. Patient not taking: Reported on 10/26/2016 09/04/16   Wardell Honour, MD  omeprazole (PRILOSEC) 40 MG capsule Take 1 capsule (40 mg total) by mouth daily. 10/26/16   Manus Gunning, MD  oxyCODONE (OXY IR/ROXICODONE) 5 MG immediate release tablet Take 1 tablet (5 mg total) by mouth every 8 (eight) hours as needed for severe pain. Patient not taking: Reported on 10/26/2016 09/04/16   Wardell Honour, MD  oxyCODONE-acetaminophen  (PERCOCET/ROXICET) 5-325 MG tablet Take 1 tablet by mouth every 8 (eight) hours as needed for severe pain. Patient not taking: Reported on 10/26/2016 06/30/16   Wardell Honour, MD  QUEtiapine (SEROQUEL) 300 MG tablet Take 1 tablet (300 mg total) by mouth at bedtime. Mood control 06/09/16   Kathlee Nations, MD  tizanidine (ZANAFLEX) 2 MG capsule Take 1 capsule (2 mg total) by mouth 3 (three) times daily as needed for muscle spasms. Patient not taking: Reported on 10/26/2016 09/04/16   Wardell Honour, MD  triamcinolone cream (KENALOG) 0.1 % APPLY  CREAM EXTERNALLY TO AFFECTED AREA TWICE DAILY 10/05/16   Wardell Honour, MD  valACYclovir (VALTREX) 1000 MG tablet Take 2 tabs po at symptom onset, repeat in  12 hours 09/04/16   Wardell Honour, MD   Family History Family History  Problem Relation Age of Onset  . Diabetes type II Mother   . Hypertension Mother   . Arthritis Mother   . Diabetes Mother   . Mental illness Mother     bipolar  . Diabetes type II Maternal Aunt   . Hypertension Father   . Heart murmur Father   . Arthritis Father   . Stroke Father   . Alopecia Sister   . Alopecia Brother   . Alopecia Sister   . Mental retardation Sister     depression  . Prostate cancer Paternal Grandfather   . Cancer Maternal Uncle   . Colon cancer Neg Hx   . Esophageal cancer Neg Hx   . Rectal cancer Neg Hx   . Stomach cancer Neg Hx   . Colon polyps Neg Hx     Social History Social History  Substance Use Topics  . Smoking status: Current Every Day Smoker    Packs/day: 0.33    Years: 44.00    Types: Cigarettes  . Smokeless tobacco: Never Used  . Alcohol use 0.0 oz/week     Comment: occasionally    Allergies   Fruit & vegetable daily [nutritional supplements]; Iodinated diagnostic agents; Ibuprofen; and Aspirin   Review of Systems Review of Systems  Genitourinary: Positive for decreased urine volume and frequency.  Musculoskeletal: Positive for back pain.  Skin: Negative for rash.    All other systems reviewed and are negative.    Physical Exam Updated Vital Signs BP 141/81 (BP Location: Left Arm)   Pulse 71   Temp 97.9 F (36.6 C) (Oral)   Resp 20   SpO2 99%   Physical Exam  Constitutional: She is oriented to person, place, and time. She appears well-developed and well-nourished.  HENT:  Head: Normocephalic and atraumatic.  Eyes: Conjunctivae are normal.  Neck: Neck supple.  Cardiovascular: Normal rate and regular rhythm.   Pulmonary/Chest: Effort normal and breath sounds normal.  Abdominal: Soft. Bowel sounds are normal. There is tenderness.  Left pelvic tenderness. Guarding, no rebound, no hernias.  Musculoskeletal: Normal range of motion. She exhibits tenderness.  Left lower lumbar tenderness. 5/5 strength in bilateral LE. Femoral pulse 2+. Normal sensation.  Neurological: She is alert and oriented to person, place, and time.  Skin: Skin is warm and dry.  Psychiatric: She has a normal mood and affect. Her behavior is normal.  Nursing note and vitals reviewed.   ED Treatments / Results  DIAGNOSTIC STUDIES: Oxygen Saturation is 99% on RA, normal by my interpretation.  COORDINATION OF CARE: 5:37 PM-Discussed treatment plan with pt at bedside and pt agreed to plan.   Labs (all labs ordered are listed, but only abnormal results are displayed) Labs Reviewed  URINALYSIS, ROUTINE W REFLEX MICROSCOPIC - Abnormal; Notable for the following:       Result Value   APPearance CLOUDY (*)    Specific Gravity, Urine 1.004 (*)    All other components within normal limits    EKG  EKG Interpretation None       Radiology US Transvaginal Non-ob  Result Date: 01/03/2017 CLINICAL DATA:  Left lower back pain x3 weeks. History of hysterectomy 23 years ago. Still has both ovaries. EXAM: TRANSABDOMINAL AND TRANSVAGINAL ULTRASOUND OF PELVIS DOPPLER ULTRASOUND OF OVARIES TECHNIQUE: Both transabdominal and transvaginal ultrasound examinations of the pelvis were  performed. Transabdominal technique was performed for global imaging of the pelvis including uterus,  ovaries, adnexal regions, and pelvic cul-de-sac. It was necessary to proceed with endovaginal exam following the transabdominal exam to visualize the ovaries. Color and duplex Doppler ultrasound was utilized to evaluate blood flow to the ovaries. COMPARISON:  CT from 08/25/2015 FINDINGS: Uterus Surgically absent. Endometrium Not applicable. Right ovary Not visualized despite transabdominal and transvaginal imaging. No right adnexal mass or adenopathy identified however. Left ovary Measurements: 1.5 x 1.3 x 2.4 cm. Normal appearance/no adnexal mass. No edema or engorgement of the left ovary. Pulsed Doppler evaluation of the visualized left ovary demonstrates normal low-resistance predominantly venous waveforms. Other findings No abnormal free fluid. IMPRESSION: 1. Nonvisualized right ovary. No right-sided adnexal mass is identified nor adenopathy. 2. Status post hysterectomy. 3. Normal-sized left ovary with predominantly low resistance venous waveforms demonstrated. Arterial waveforms were difficult to sample. Given the nonengorged appearance of the left ovary and vascular outflow demonstrated, acute torsion is believed unlikely. Electronically Signed   By: Ashley Royalty M.D.   On: 01/03/2017 20:02   US Pelvis Complete  Result Date: 01/03/2017 CLINICAL DATA:  Left lower back pain x3 weeks. History of hysterectomy 23 years ago. Still has both ovaries. EXAM: TRANSABDOMINAL AND TRANSVAGINAL ULTRASOUND OF PELVIS DOPPLER ULTRASOUND OF OVARIES TECHNIQUE: Both transabdominal and transvaginal ultrasound examinations of the pelvis were performed. Transabdominal technique was performed for global imaging of the pelvis including uterus, ovaries, adnexal regions, and pelvic cul-de-sac. It was necessary to proceed with endovaginal exam following the transabdominal exam to visualize the ovaries. Color and duplex Doppler  ultrasound was utilized to evaluate blood flow to the ovaries. COMPARISON:  CT from 08/25/2015 FINDINGS: Uterus Surgically absent. Endometrium Not applicable. Right ovary Not visualized despite transabdominal and transvaginal imaging. No right adnexal mass or adenopathy identified however. Left ovary Measurements: 1.5 x 1.3 x 2.4 cm. Normal appearance/no adnexal mass. No edema or engorgement of the left ovary. Pulsed Doppler evaluation of the visualized left ovary demonstrates normal low-resistance predominantly venous waveforms. Other findings No abnormal free fluid. IMPRESSION: 1. Nonvisualized right ovary. No right-sided adnexal mass is identified nor adenopathy. 2. Status post hysterectomy. 3. Normal-sized left ovary with predominantly low resistance venous waveforms demonstrated. Arterial waveforms were difficult to sample. Given the nonengorged appearance of the left ovary and vascular outflow demonstrated, acute torsion is believed unlikely. Electronically Signed   By: Ashley Royalty M.D.   On: 01/03/2017 20:02   [place] Art/ven Flow Abd Pelv Doppler  Result Date: 01/03/2017 CLINICAL DATA:  Left lower back pain x3 weeks. History of hysterectomy 23 years ago. Still has both ovaries. EXAM: TRANSABDOMINAL AND TRANSVAGINAL ULTRASOUND OF PELVIS DOPPLER ULTRASOUND OF OVARIES TECHNIQUE: Both transabdominal and transvaginal ultrasound examinations of the pelvis were performed. Transabdominal technique was performed for global imaging of the pelvis including uterus, ovaries, adnexal regions, and pelvic cul-de-sac. It was necessary to proceed with endovaginal exam following the transabdominal exam to visualize the ovaries. Color and duplex Doppler ultrasound was utilized to evaluate blood flow to the ovaries. COMPARISON:  CT from 08/25/2015 FINDINGS: Uterus Surgically absent. Endometrium Not applicable. Right ovary Not visualized despite transabdominal and transvaginal imaging. No right adnexal mass or adenopathy  identified however. Left ovary Measurements: 1.5 x 1.3 x 2.4 cm. Normal appearance/no adnexal mass. No edema or engorgement of the left ovary. Pulsed Doppler evaluation of the visualized left ovary demonstrates normal low-resistance predominantly venous waveforms. Other findings No abnormal free fluid. IMPRESSION: 1. Nonvisualized right ovary. No right-sided adnexal mass is identified nor adenopathy. 2. Status post hysterectomy. 3. Normal-sized left ovary  with predominantly low resistance venous waveforms demonstrated. Arterial waveforms were difficult to sample. Given the nonengorged appearance of the left ovary and vascular outflow demonstrated, acute torsion is believed unlikely. Electronically Signed   By: Ashley Royalty M.D.   On: 01/03/2017 20:02    Procedures Procedures (including critical care time)  Medications Ordered in ED Medications  oxyCODONE-acetaminophen (PERCOCET/ROXICET) 5-325 MG per tablet 1 tablet (1 tablet Oral Given 01/03/17 1759)     Initial Impression / Assessment and Plan / ED Course  I have reviewed the triage vital signs and the nursing notes.  Pertinent labs & imaging results that were available during my care of the patient were reviewed by me and considered in my medical decision making (see chart for details).    Patient is a 56 year old female presenting with 3 days of worsening left lower quadrant pain. It is worse with movement and standing. She does have known chronic back issues with radicular symptoms into the left leg but the abdominal pain is something different. She does file she has to urinate more but denies any dysuria. Bowel movements have been normal. On exam patient has left pelvic tenderness. Patient no longer has a uterus but ovaries are still present. Urine within normal limits. Will do a pelvic ultrasound to further evaluate to rule out torsion, ovarian cyst or other pathology that could be causing pain. If not this may be worsening radiculopathy.  However she is neurovascularly intact.  11:31 PM UA without signs of urinary tract infection. Low suspicion for kidney stone. Ultrasound shows no ovarian cyst on the left and unable to visualize the right. Left ovary is of normal size with venous waveforms they were having difficulty finding arterial waveforms but there is no appearance suggestive of torsion. Feel that abnormality would be seen as patient has had pain for the last 3 days.  Most likely lumbar radiculopathy in nature. Patient improved after pain medication. She was discharged home and encouraged to follow-up with her PCP  Final Clinical Impressions(s) / ED Diagnoses   Final diagnoses:  Lumbar radiculopathy    New Prescriptions Discharge Medication List as of 01/03/2017  8:39 PM     I personally performed the services described in this documentation, which was scribed in my presence.  The recorded information has been reviewed and considered.     Blanchie Dessert, MD 01/03/17 [phone]

## 2017-01-03 NOTE — ED Triage Notes (Signed)
C/o pain to left groin,hip radiates to leg x 2 weeks-denies injury-reports hx of sciatica-NAD-steady gait

## 2017-01-07 ENCOUNTER — Other Ambulatory Visit (HOSPITAL_COMMUNITY): Payer: Self-pay | Admitting: Psychiatry

## 2017-01-07 DIAGNOSIS — F250 Schizoaffective disorder, bipolar type: Secondary | ICD-10-CM

## 2017-01-08 ENCOUNTER — Ambulatory Visit: Payer: Medicare Other

## 2017-01-12 ENCOUNTER — Ambulatory Visit (INDEPENDENT_AMBULATORY_CARE_PROVIDER_SITE_OTHER): Payer: Medicare Other | Admitting: Family Medicine

## 2017-01-12 ENCOUNTER — Ambulatory Visit (INDEPENDENT_AMBULATORY_CARE_PROVIDER_SITE_OTHER): Payer: Medicare Other

## 2017-01-12 ENCOUNTER — Other Ambulatory Visit (HOSPITAL_COMMUNITY): Payer: Self-pay | Admitting: Psychiatry

## 2017-01-12 ENCOUNTER — Encounter: Payer: Self-pay | Admitting: Family Medicine

## 2017-01-12 VITALS — BP 135/79 | HR 92 | Temp 98.2°F | Resp 16 | Ht 63.75 in | Wt 120.4 lb

## 2017-01-12 DIAGNOSIS — S39012D Strain of muscle, fascia and tendon of lower back, subsequent encounter: Secondary | ICD-10-CM

## 2017-01-12 DIAGNOSIS — M25552 Pain in left hip: Secondary | ICD-10-CM

## 2017-01-12 DIAGNOSIS — D508 Other iron deficiency anemias: Secondary | ICD-10-CM | POA: Diagnosis not present

## 2017-01-12 DIAGNOSIS — I10 Essential (primary) hypertension: Secondary | ICD-10-CM

## 2017-01-12 DIAGNOSIS — F5089 Other specified eating disorder: Secondary | ICD-10-CM | POA: Diagnosis not present

## 2017-01-12 DIAGNOSIS — K219 Gastro-esophageal reflux disease without esophagitis: Secondary | ICD-10-CM | POA: Diagnosis not present

## 2017-01-12 DIAGNOSIS — R1032 Left lower quadrant pain: Secondary | ICD-10-CM

## 2017-01-12 DIAGNOSIS — M5136 Other intervertebral disc degeneration, lumbar region: Secondary | ICD-10-CM | POA: Diagnosis not present

## 2017-01-12 DIAGNOSIS — M545 Low back pain: Secondary | ICD-10-CM | POA: Diagnosis not present

## 2017-01-12 MED ORDER — CYCLOBENZAPRINE HCL 5 MG PO TABS: 5.0000 mg | ORAL_TABLET | Freq: Three times a day (TID) | ORAL | 1 refills | Status: DC | PRN

## 2017-01-12 MED ORDER — OXYCODONE HCL 5 MG PO TABS: 5.0000 mg | ORAL_TABLET | Freq: Three times a day (TID) | ORAL | 0 refills | Status: DC | PRN

## 2017-01-12 NOTE — Progress Notes (Signed)
Subjective:    Patient ID: Melissa Bridges, female    DOB: 1961/10/14, 56 y.o.   MRN: [id number]  01/12/2017  Back Pain (left siatic back pain off and on but steady for a couple weeks, pain in left groin for about a month)   HPI This 57 y.o. female presents for evaluation of L groin region and low back pain.  Has been sick since October 2017.  Chronic lower back pain; had been hunched over for one month.  Then daughter noticed that rubbing L groin region.    S/p ED visit on 01/03/17; pelvic US negative; L ovary normal.  S/p hysterectomy.  S/o laparoscopy years ago; removed adhesions.  Lumbar injection in October 2017.  Now back pain on L side.  No dysuria, frequency, hematuria, nocturia rare.  No vaginal discharge; no vaginal pain.  +nausea; no v/d/mild constipation.  Usually has b.m. Three times per week. Daughter says less; has constipation.  Has picca and eats cornstarch.  Daughter gives Senakot-S.  B.m have been more regular.  Has been eating more.   Standing up or sitting too long makes worse.  If lays down, pain is better.  Lower back is worse when supine.  Sleeping in chair with legs up.      Immunization History  Administered Date(s) Administered  . Hepatitis A, Adult 05/11/2016  . Influenza,inj,Quad PF,36+ Mos 09/29/2015, 09/04/2016  . Pneumococcal Polysaccharide-23 11/12/2011  . Tdap 05/11/2016   BP Readings from Last 3 Encounters:  01/12/17 135/79  01/03/17 162/84  10/26/16 128/77   Wt Readings from Last 3 Encounters:  01/12/17 120 lb 6.4 oz (54.6 kg)  10/26/16 119 lb (54 kg)  10/12/16 119 lb 12.8 oz (54.3 kg)    Review of Systems  Constitutional: Negative for chills, diaphoresis, fatigue and fever.  Eyes: Negative for visual disturbance.  Respiratory: Negative for cough and shortness of breath.   Cardiovascular: Negative for chest pain, palpitations and leg swelling.  Gastrointestinal: Positive for abdominal pain and constipation. Negative for diarrhea, nausea and  vomiting.  Endocrine: Negative for cold intolerance, heat intolerance, polydipsia, polyphagia and polyuria.  Genitourinary: Positive for pelvic pain. Negative for decreased urine volume, dysuria, flank pain, genital sores, hematuria, urgency, vaginal bleeding, vaginal discharge and vaginal pain.  Musculoskeletal: Positive for back pain, gait problem and myalgias.  Neurological: Negative for dizziness, tremors, seizures, syncope, facial asymmetry, speech difficulty, weakness, light-headedness, numbness and headaches.    Past Medical History:  Diagnosis Date  . Allergy   . Alopecia   . Anxiety   . Arthritis   . Asthma   . Cataract    "mild"  . COPD (chronic obstructive pulmonary disease) (HCC)   . Depression   . GERD (gastroesophageal reflux disease)    o cc- takes OTC if needed.  . Glaucoma   . Hepatitis C   . Hypertension    onset age 64.  . Iron deficiency anemia   . Migraine   . Schizoaffective disorder   . Schizoaffective disorder (HCC)   . Sciatic pain   . Seizures (HCC)    onset in childhood. 06-03-16- per pt 8 months agoGeneralized tonic-clonic.Last seizure couple of months ago- last sz 9-10 months ago per pt 10-12-2016  . Shortness of breath dyspnea   . Substance abuse   . Ulcer [facility])    Past Surgical History:  Procedure Laterality Date  . ABDOMINAL HYSTERECTOMY     ovarian cyst B, cervical dysplasia, fibroids.   Ovaries intact.  . COLONOSCOPY    .  COLONOSCOPY W/ BIOPSIES    . DILATION AND CURETTAGE OF UTERUS     x2  . EXPLORATORY LAPAROTOMY     x3  . GYNECOLOGIC CRYOSURGERY     x3  . MANDIBLE RECONSTRUCTION N/A 06/27/2015   Procedure: REMOVAL MAXILLARY PALATAL TORUS.  REMOVAL MANDIBULAR TORUS AND EXOSTOSIS.  ;  Surgeon: Ocie Doyne, DDS;  Location: [facility] OR;  Service: Oral Surgery;  Laterality: N/A;  . OVARIAN CYST REMOVAL  2008  . POLYPECTOMY     Allergies  Allergen Reactions  . Fruit & Vegetable Daily [Nutritional Supplements] Shortness Of Breath    Aloe    . Iodinated Diagnostic Agents Swelling    Swelling and itching of left side of her face only after CT SI injection(no steroid used)  . Ibuprofen Other (See Comments)    Stomach upset; However, pt can take Meloxicam without incident and takes this at home.   . Aspirin Rash and Other (See Comments)    Stomach upset    Social History   Social History  . Marital status: Widowed    Spouse name: N/A  . Number of children: 1  . Years of education: N/A   Occupational History  . disabled     mental illness; seizures   Social History Main Topics  . Smoking status: Current Every Day Smoker    Packs/day: 0.33    Years: 44.00    Types: Cigarettes  . Smokeless tobacco: Never Used  . Alcohol use 0.0 oz/week     Comment: occasionally  . Drug use: Yes    Types: Marijuana     Comment: no longer using  . Sexual activity: Not on file     Comment: widow   Other Topics Concern  . Not on file   Social History Narrative   Marital status:  Widowed since 2002.  Married x 16 years. + dating.  Moved from [place] to live with daughter in 2013.     Children:  One child/daughter (33); two grandchildren.      Lives: with daughter, grandchildren 2.  Does not drive due to epilepsy.      Employment:  Disability for schizoaffective disorder.      Tobacco: 1 ppd x since 8th grade.      Alcohol:  Social; rare drinking due to seizure medications.  Weekends.       Drugs: none; previous use of marijuana.  Previous iv drug use, cocaine.      Exercise: none      Seatbelt:  100%      Guns: none   Family History  Problem Relation Age of Onset  . Diabetes type II Mother   . Hypertension Mother   . Arthritis Mother   . Diabetes Mother   . Mental illness Mother     bipolar  . Diabetes type II Maternal Aunt   . Hypertension Father   . Heart murmur Father   . Arthritis Father   . Stroke Father   . Alopecia Sister   . Alopecia Brother   . Alopecia Sister   . Mental retardation Sister      depression  . Prostate cancer Paternal Grandfather   . Cancer Maternal Uncle   . Colon cancer Neg Hx   . Esophageal cancer Neg Hx   . Rectal cancer Neg Hx   . Stomach cancer Neg Hx   . Colon polyps Neg Hx        Objective:    BP 135/79 (BP Location: Right  Arm, Patient Position: Sitting, Cuff Size: Small)   Pulse 92   Temp 98.2 F (36.8 C) (Oral)   Resp 16   Ht 5' 3.75" (1.619 m)   Wt 120 lb 6.4 oz (54.6 kg)   SpO2 96%   BMI 20.83 kg/m  Physical Exam  Constitutional: She is oriented to person, place, and time. She appears well-developed and well-nourished. No distress.  HENT:  Head: Normocephalic and atraumatic.  Right Ear: External ear normal.  Left Ear: External ear normal.  Nose: Nose normal.  Mouth/Throat: Oropharynx is clear and moist.  Eyes: Conjunctivae and EOM are normal. Pupils are equal, round, and reactive to light.  Neck: Normal range of motion. Neck supple. Carotid bruit is not present. No thyromegaly present.  Cardiovascular: Normal rate, regular rhythm, normal heart sounds and intact distal pulses.  Exam reveals no gallop and no friction rub.   No murmur heard. Pulmonary/Chest: Effort normal and breath sounds normal. She has no wheezes. She has no rales.  Abdominal: Soft. Bowel sounds are normal. She exhibits no distension and no mass. There is no tenderness. There is no rebound and no guarding.  Genitourinary: Vagina normal. There is no rash, tenderness or lesion on the right labia. There is no rash, tenderness, lesion or injury on the left labia. Right adnexum displays no mass, no tenderness and no fullness. Left adnexum displays no mass, no tenderness and no fullness.  Musculoskeletal:       Right hip: Normal.       Left hip: She exhibits normal range of motion, normal strength, no tenderness and no bony tenderness.       Lumbar back: She exhibits pain. She exhibits normal range of motion, no tenderness and no bony tenderness.  Painful ROM of L hip.    Lymphadenopathy:    She has no cervical adenopathy.  Neurological: She is alert and oriented to person, place, and time. No cranial nerve deficit.  Skin: Skin is warm and dry. No rash noted. She is not diaphoretic. No erythema. No pallor.  Psychiatric: She has a normal mood and affect. Her behavior is normal.        Assessment & Plan:   1. Left hip pain   2. Left groin pain   3. Degenerative disc disease, lumbar   4. Pica   5. Iron deficiency anemia secondary to inadequate dietary iron intake   6. Essential hypertension   7. Gastroesophageal reflux disease without esophagitis   8. Strain of lumbar region, subsequent encounter    -New. -s/p ED visit; records reviewed in detail during visit.  S/p pelvic US in ED; revealed normal L ovary; R ovary not visualized. U/a normal in ED. -obtain L hip films.  Treat with Flexeril and refilled oxycodone; suggestive of musculoskeletal etiology.  -obtain labs. -refer to GI for GERD.    Orders Placed This Encounter  Procedures  . DG HIP UNILAT W OR W/O PELVIS 2-3 VIEWS LEFT    Standing Status:   Future    Number of Occurrences:   1    Standing Expiration Date:   01/12/2018    Order Specific Question:   Reason for Exam (SYMPTOM  OR DIAGNOSIS REQUIRED)    Answer:   L hip pain/L groin pain with L sciatica    Order Specific Question:   Is the patient pregnant?    Answer:   No    Comments:   hysterectomy    Order Specific Question:   Preferred imaging location?  Answer:   External  . DG Lumbar Spine Complete    Standing Status:   Future    Number of Occurrences:   1    Standing Expiration Date:   01/12/2018    Order Specific Question:   Reason for Exam (SYMPTOM  OR DIAGNOSIS REQUIRED)    Answer:   low back pain/DDD lumbar spine    Order Specific Question:   Is the patient pregnant?    Answer:   No    Order Specific Question:   Preferred imaging location?    Answer:   External  . CBC with Differential/Platelet  . Iron  . Iron and TIBC   . Comprehensive metabolic panel  . Ambulatory referral to Gastroenterology    Referral Priority:   Routine    Referral Type:   Consultation    Referral Reason:   Specialty Services Required    Number of Visits Requested:   1   Meds ordered this encounter  Medications  . cyclobenzaprine (FLEXERIL) 5 MG tablet    Sig: Take 1 tablet (5 mg total) by mouth 3 (three) times daily as needed for muscle spasms.    Dispense:  60 tablet    Refill:  1  . oxyCODONE (OXY IR/ROXICODONE) 5 MG immediate release tablet    Sig: Take 1 tablet (5 mg total) by mouth every 8 (eight) hours as needed for severe pain.    Dispense:  60 tablet    Refill:  0    Return in about 3 months (around 04/11/2017) for recheck.   Gracelynne Benedict Paulita Fujita, M.D. Primary Care at [facility] previously [facility] [address] [place], [place]  [address] [phone] phone [phone] fax

## 2017-01-12 NOTE — Patient Instructions (Signed)
     IF you received an x-ray today, you will receive an invoice from [facility]. Please contact [facility] at [phone] with questions or concerns regarding your invoice.   IF you received labwork today, you will receive an invoice from [facility]. Please contact [facility] at [phone] with questions or concerns regarding your invoice.   Our billing staff will not be able to assist you with questions regarding bills from these companies.  You will be contacted with the lab results as soon as they are available. The fastest way to get your results is to activate your My Chart account. Instructions are located on the last page of this paperwork. If you have not heard from us regarding the results in 2 weeks, please contact this office.     

## 2017-01-12 NOTE — Telephone Encounter (Signed)
Patient not seen since June.  She need to be seen for future refills

## 2017-01-13 ENCOUNTER — Encounter (HOSPITAL_COMMUNITY): Payer: Self-pay | Admitting: Psychiatry

## 2017-01-13 ENCOUNTER — Ambulatory Visit (INDEPENDENT_AMBULATORY_CARE_PROVIDER_SITE_OTHER): Payer: Medicare Other | Admitting: Psychiatry

## 2017-01-13 VITALS — BP 110/62 | HR 82 | Ht 63.75 in | Wt 119.4 lb

## 2017-01-13 DIAGNOSIS — F250 Schizoaffective disorder, bipolar type: Secondary | ICD-10-CM | POA: Diagnosis not present

## 2017-01-13 DIAGNOSIS — Z833 Family history of diabetes mellitus: Secondary | ICD-10-CM

## 2017-01-13 DIAGNOSIS — Z90710 Acquired absence of both cervix and uterus: Secondary | ICD-10-CM

## 2017-01-13 DIAGNOSIS — G40009 Localization-related (focal) (partial) idiopathic epilepsy and epileptic syndromes with seizures of localized onset, not intractable, without status epilepticus: Secondary | ICD-10-CM

## 2017-01-13 DIAGNOSIS — Z818 Family history of other mental and behavioral disorders: Secondary | ICD-10-CM

## 2017-01-13 DIAGNOSIS — Z8261 Family history of arthritis: Secondary | ICD-10-CM | POA: Diagnosis not present

## 2017-01-13 DIAGNOSIS — Z800 Family history of malignant neoplasm of digestive organs: Secondary | ICD-10-CM

## 2017-01-13 DIAGNOSIS — Z9889 Other specified postprocedural states: Secondary | ICD-10-CM | POA: Diagnosis not present

## 2017-01-13 DIAGNOSIS — Z8042 Family history of malignant neoplasm of prostate: Secondary | ICD-10-CM

## 2017-01-13 DIAGNOSIS — Z823 Family history of stroke: Secondary | ICD-10-CM

## 2017-01-13 DIAGNOSIS — Z8249 Family history of ischemic heart disease and other diseases of the circulatory system: Secondary | ICD-10-CM | POA: Diagnosis not present

## 2017-01-13 LAB — COMPREHENSIVE METABOLIC PANEL
A/G RATIO: 1.3 (ref 1.2–2.2)
ALBUMIN: 4.8 g/dL (ref 3.5–5.5)
ALT: 81 IU/L — AB (ref 0–32)
AST: 182 IU/L — ABNORMAL HIGH (ref 0–40)
Alkaline Phosphatase: 96 IU/L (ref 39–117)
BILIRUBIN TOTAL: 0.3 mg/dL (ref 0.0–1.2)
BUN/Creatinine Ratio: 13 (ref 9–23)
BUN: 11 mg/dL (ref 6–24)
CALCIUM: 10 mg/dL (ref 8.7–10.2)
CHLORIDE: 101 mmol/L (ref 96–106)
CO2: 22 mmol/L (ref 18–29)
Creatinine, Ser: 0.82 mg/dL (ref 0.57–1.00)
GFR calc non Af Amer: 80 mL/min/{1.73_m2} (ref >59)
GFR, EST AFRICAN AMERICAN: 92 mL/min/{1.73_m2} (ref >59)
Globulin, Total: 3.6 g/dL (ref 1.5–4.5)
Glucose: 87 mg/dL (ref 65–99)
POTASSIUM: 4 mmol/L (ref 3.5–5.2)
Sodium: 141 mmol/L (ref 134–144)
TOTAL PROTEIN: 8.4 g/dL (ref 6.0–8.5)

## 2017-01-13 LAB — CBC WITH DIFFERENTIAL/PLATELET
BASOS ABS: 0 10*3/uL (ref 0.0–0.2)
BASOS: 1 %
EOS (ABSOLUTE): 0.1 10*3/uL (ref 0.0–0.4)
Eos: 1 %
Hematocrit: 38.8 % (ref 34.0–46.6)
Hemoglobin: 12.5 g/dL (ref 11.1–15.9)
Immature Grans (Abs): 0 10*3/uL (ref 0.0–0.1)
Immature Granulocytes: 0 %
LYMPHS ABS: 2.2 10*3/uL (ref 0.7–3.1)
LYMPHS: 34 %
MCH: 30.6 pg (ref 26.6–33.0)
MCHC: 32.2 g/dL (ref 31.5–35.7)
MCV: 95 fL (ref 79–97)
MONOS ABS: 0.4 10*3/uL (ref 0.1–0.9)
Monocytes: 6 %
NEUTROS ABS: 3.8 10*3/uL (ref 1.4–7.0)
Neutrophils: 58 %
PLATELETS: 263 10*3/uL (ref 150–379)
RBC: 4.08 x10E6/uL (ref 3.77–5.28)
RDW: 17.3 % — AB (ref 12.3–15.4)
WBC: 6.4 10*3/uL (ref 3.4–10.8)

## 2017-01-13 LAB — IRON AND TIBC
Iron Saturation: 17 % (ref 15–55)
Iron: 86 ug/dL (ref 27–159)
TIBC: 494 ug/dL — AB (ref 250–450)
UIBC: 408 ug/dL (ref 131–425)

## 2017-01-13 MED ORDER — QUETIAPINE FUMARATE 300 MG PO TABS: 300.0000 mg | ORAL_TABLET | Freq: Every day | ORAL | 2 refills | Status: DC

## 2017-01-13 MED ORDER — CITALOPRAM HYDROBROMIDE 10 MG PO TABS: 10.0000 mg | ORAL_TABLET | Freq: Every day | ORAL | 2 refills | Status: DC
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
## 2017-01-19 ENCOUNTER — Ambulatory Visit (HOSPITAL_COMMUNITY): Payer: Self-pay | Admitting: Psychiatry

## 2017-01-27 ENCOUNTER — Telehealth: Payer: Self-pay

## 2017-01-27 DIAGNOSIS — M5136 Other intervertebral disc degeneration, lumbar region: Secondary | ICD-10-CM

## 2017-01-27 NOTE — Telephone Encounter (Signed)
PATIENT STATES SHE SAW DR. Tamala Julian A COUPLE OF WEEKS AGO FOR ARTHRITIS IN HER BACK. SHE WOULD LIKE TO ASK DR. Tamala Julian IF SHE CAN HAVE A REFERRAL TO THE SPINE AND SCOLIOSIS SPECIALIST? BEST PHONE [phone] (CELL) [place]

## 2017-01-28 NOTE — Telephone Encounter (Signed)
Please place referral if appropriate.

## 2017-01-31 NOTE — Telephone Encounter (Signed)
Call --- patient has previously seen Dr. Othelia Pulling for chronic lower back pain. Is she wanting a second opinion or does she want to return to Dr. Othelia Pulling?

## 2017-02-01 NOTE — Telephone Encounter (Signed)
Patient states she does not want to see Dr. Othelia Pulling, he will not give her pain medication.  She wants someone that will prescribe her pain medication for her back.

## 2017-02-01 NOTE — Telephone Encounter (Signed)
I placed referral to spine and scoliosis for patient.  She may need to undergo a pain specialist consultation, yet I agree with a second opinion and if there is no further treatment, I can refer her to pain management.

## 2017-02-02 ENCOUNTER — Telehealth: Payer: Self-pay

## 2017-02-02 NOTE — Telephone Encounter (Signed)
Pt contacted and Dr. Thompson Caul message was conveyed. She would like to send thanks to Dr. Tamala Julian for putting in another referral. I instructed her that if she does not hear anything from the new referral in 2 weeks to call our office back.

## 2017-02-02 NOTE — Telephone Encounter (Signed)
Pt has requested a referral for spine and scoliosis. Referral placed but I cannot sent it out until the [facility] notes from last visit (01/12/17) are completed and signed by the provider.

## 2017-02-17 ENCOUNTER — Ambulatory Visit: Payer: Medicare Other

## 2017-02-17 ENCOUNTER — Ambulatory Visit: Payer: Medicare Other | Admitting: Family Medicine

## 2017-02-17 VITALS — BP 153/88 | HR 96 | Temp 97.5°F | Resp 18 | Ht 63.75 in | Wt 121.2 lb

## 2017-02-17 DIAGNOSIS — M5442 Lumbago with sciatica, left side: Principal | ICD-10-CM

## 2017-02-17 DIAGNOSIS — G8929 Other chronic pain: Secondary | ICD-10-CM

## 2017-02-17 MED ORDER — METHOCARBAMOL 750 MG PO TABS: 750.0000 mg | ORAL_TABLET | ORAL | 0 refills | Status: DC | PRN

## 2017-02-17 MED ORDER — KETOROLAC TROMETHAMINE 30 MG/ML IJ SOLN: 60.0000 mg | Freq: Once | INTRAMUSCULAR | Status: DC

## 2017-02-17 NOTE — Progress Notes (Signed)
Patient ID: Melissa Bridges, female    DOB: 11/04/1961, 56 y.o.   MRN: [id number]  PCP: Reginia Forts, MD  Chief Complaint  Patient presents with  . Back Pain    left side x 3 weeks     Subjective:  HPI 56 year old female presents for evaluation of left sided sciatic pain x 3 weeks. Three weeks of left sided back pain. Reports a history of sciatic back pain. Reports over the last few days, back pain has become more bilateral  shooting down through her bilateral buttock and into her upper thigh. She has been alternating cyclobenzaprine, methocarbamol, and oxycodone and reports only minimal relief. She is currently out of oxycodone. She also reports that she has taken some naproxen as well. Would like a referral today to a back and spine specialist and she has their phone number and address with her today. Pain is improved with standing  Social History   Social History  . Marital status: Widowed    Spouse name: N/A  . Number of children: 1  . Years of education: N/A   Occupational History  . disabled     mental illness; seizures   Social History Main Topics  . Smoking status: Current Every Day Smoker    Packs/day: 0.33    Years: 44.00    Types: Cigarettes  . Smokeless tobacco: Never Used  . Alcohol use 0.0 oz/week     Comment: occasionally  . Drug use: Yes    Types: Marijuana     Comment: no longer using  . Sexual activity: Not on file     Comment: widow   Other Topics Concern  . Not on file   Social History Narrative   Marital status:  Widowed since 2002.  Married x 16 years. + dating.  Moved from [place] to live with daughter in 2013.     Children:  One child/daughter (33); two grandchildren.      Lives: with daughter, grandchildren 2.  Does not drive due to epilepsy.      Employment:  Disability for schizoaffective disorder.      Tobacco: 1 ppd x since 8th grade.      Alcohol:  Social; rare drinking due to seizure medications.  Weekends.       Drugs: none;  previous use of marijuana.  Previous iv drug use, cocaine.      Exercise: none      Seatbelt:  100%      Guns: none    Family History  Problem Relation Age of Onset  . Diabetes type II Mother   . Hypertension Mother   . Arthritis Mother   . Diabetes Mother   . Mental illness Mother     bipolar  . Diabetes type II Maternal Aunt   . Hypertension Father   . Heart murmur Father   . Arthritis Father   . Stroke Father   . Alopecia Sister   . Alopecia Brother   . Alopecia Sister   . Mental retardation Sister     depression  . Prostate cancer Paternal Grandfather   . Cancer Maternal Uncle   . Colon cancer Neg Hx   . Esophageal cancer Neg Hx   . Rectal cancer Neg Hx   . Stomach cancer Neg Hx   . Colon polyps Neg Hx    Review of Systems See HPI Patient Active Problem List   Diagnosis Date Noted  . Serrated adenoma of colon 08/05/2016  . Localization-related idiopathic epilepsy  and epileptic syndromes with seizures of localized onset, not intractable, without status epilepticus ([place]) 10/16/2015  . 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 Alcohol abuse 09/06/2014  . Asthma with acute exacerbation 03/09/2013  . Hypertension 03/09/2013  . Lower back pain 03/09/2013  . Chronic hepatitis C without hepatic coma ([place]) 03/08/2013  . Smoker 12/12/2012  . Schizoaffective disorder ([place]) 12/12/2012  . Substance abuse 11/14/2011    Allergies  Allergen Reactions  . Fruit & Vegetable Daily [Nutritional Supplements] Shortness Of Breath    Aloe  . Iodinated Diagnostic Agents Swelling    Swelling and itching of left side of her face only after CT SI injection(no steroid used)  . Ibuprofen Other (See Comments)    Stomach upset; However, pt can take Meloxicam without incident and takes this at home.   . Aspirin Rash and Other (See Comments)    Stomach upset    Prior to Admission medications   Medication Sig Start Date End Date Taking? Authorizing Provider  albuterol (VENTOLIN HFA) 108 (90  Base) MCG/ACT inhaler INHALE TWO PUFFS BY MOUTH EVERY 6 HOURS AS NEEDED FOR SHORTNESS OF BREATH 09/04/16  Yes Wardell Honour, MD  amLODipine (NORVASC) 5 MG tablet Take 1 tablet (5 mg total) by mouth daily. 09/04/16  Yes Wardell Honour, MD  budesonide-formoterol [facility]) 80-4.5 MCG/ACT inhaler Inhale 2 puffs into the lungs daily as needed (for shortness of breath). 09/04/16  Yes Wardell Honour, MD  citalopram (CELEXA) 10 MG tablet Take 1 tablet (10 mg total) by mouth daily. For depression 01/13/17  Yes Kathlee Nations, MD  conjugated estrogens (PREMARIN) vaginal cream Place 1 Applicatorful vaginally daily. 09/04/16  Yes Wardell Honour, MD  cyclobenzaprine (FLEXERIL) 5 MG tablet Take 1 tablet (5 mg total) by mouth 3 (three) times daily as needed for muscle spasms. 01/12/17  Yes Wardell Honour, MD  ferrous sulfate 325 (65 FE) MG tablet Take 1 tablet (325 mg total) by mouth daily with breakfast. 09/04/16  Yes Wardell Honour, MD  fluticasone [facility]) 50 MCG/ACT nasal spray Place 2 sprays into both nostrils daily. 06/08/16  Yes Stephanie D English, PA  ipratropium-albuterol (DUONEB) 0.5-2.5 (3) MG/3ML SOLN Take 3 mLs by nebulization every 4 (four) hours as needed (shortness of breath). Reported on 12/02/2015   Yes Historical Provider, MD  LamoTRIgine (LAMICTAL XR) 25 MG TB24 tablet take 1 tablet daily for 2 weeks, then increase to 2 tablets daily for 2 weeks, then increase to 4 tablets daily then see me in follow-up Patient taking differently: 100 mg at bedtime. take 1 tablet daily for 2 weeks, then increase to 2 tablets daily for 2 weeks, then increase to 4 tablets daily then see me in follow-up 12/02/15  Yes Cameron Sprang, MD  methocarbamol (ROBAXIN) 750 MG tablet Take 1 tablet (750 mg total) by mouth every 8 (eight) hours as needed for muscle spasms. 01/03/17  Yes Blanchie Dessert, MD  naproxen (NAPROSYN) 500 MG tablet Take 1 tablet (500 mg total) by mouth 2 (two) times daily with a meal. 01/03/17  Yes Blanchie Dessert, MD  omeprazole (PRILOSEC) 40 MG capsule Take 1 capsule (40 mg total) by mouth daily. 10/26/16  Yes Manus Gunning, MD  oxyCODONE (OXY IR/ROXICODONE) 5 MG immediate release tablet Take 1 tablet (5 mg total) by mouth every 8 (eight) hours as needed for severe pain. 01/12/17  Yes Wardell Honour, MD  QUEtiapine (SEROQUEL) 300 MG tablet Take 1 tablet (300 mg total) by mouth  at bedtime. Mood control 01/13/17  Yes Kathlee Nations, MD  tizanidine (ZANAFLEX) 2 MG capsule Take 1 capsule (2 mg total) by mouth 3 (three) times daily as needed for muscle spasms. 09/04/16  Yes Wardell Honour, MD  triamcinolone cream (KENALOG) 0.1 % APPLY  CREAM EXTERNALLY TO AFFECTED AREA TWICE DAILY 10/05/16  Yes Wardell Honour, MD  valACYclovir (VALTREX) 1000 MG tablet Take 2 tabs po at symptom onset, repeat in 12 hours 09/04/16  Yes Wardell Honour, MD    Past Medical, Surgical Family and Social History reviewed and updated.    Objective:   Today's Vitals   02/17/17 0844  BP: (!) 153/88  Pulse: 96  Resp: 18  Temp: 97.5 F (36.4 C)  TempSrc: Oral  SpO2: 98%  Weight: 121 lb 3.2 oz (55 kg)  Height: 5' 3.75" (1.619 m)    Wt Readings from Last 3 Encounters:  02/17/17 121 lb 3.2 oz (55 kg)  01/12/17 120 lb 6.4 oz (54.6 kg)  10/26/16 119 lb (54 kg)    Physical Exam  Constitutional: She is oriented to person, place, and time. She appears well-developed and well-nourished.  Neck: Normal range of motion and full passive range of motion without pain. Neck supple.  Cardiovascular: Normal rate, regular rhythm and normal heart sounds.   Pulmonary/Chest: Breath sounds normal.  Musculoskeletal: She exhibits tenderness.       Lumbar back: She exhibits tenderness. She exhibits no swelling and no edema.  Neurological: She is alert and oriented to person, place, and time. GCS eye subscore is 4. GCS verbal subscore is 5. GCS motor subscore is 6.  Reflex Scores:      Patellar reflexes are 2+ on the right side and  2+ on the left side. Skin: Skin is warm.  Psychiatric: She has a normal mood and affect. Her behavior is normal. Judgment and thought content normal.       Assessment & Plan:  1. Chronic left-sided low back pain with left-sided sciatica - Ambulatory referral to Spine Surgery -Increased the frequency of Robaxin to every 4 hours as needed for back pain until she can get a refill of her oxycodone. Advised to discontinue cyclobenzaprine if she felt this was ineffective. -Patient would like a refill on oxycodone. I will send Dr. Tamala Julian a phone note requesting to refill oxycodone.     Carroll Sage. Kenton Kingfisher, MSN, FNP-C [facility]

## 2017-02-17 NOTE — Patient Instructions (Signed)
     IF you received an x-ray today, you will receive an invoice from [facility]. Please contact  Radiology at [phone] with questions or concerns regarding your invoice.   IF you received labwork today, you will receive an invoice from [facility]. Please contact [facility] at [phone] with questions or concerns regarding your invoice.   Our billing staff will not be able to assist you with questions regarding bills from these companies.  You will be contacted with the lab results as soon as they are available. The fastest way to get your results is to activate your My Chart account. Instructions are located on the last page of this paperwork. If you have not heard from us regarding the results in 2 weeks, please contact this office.     

## 2017-02-18 ENCOUNTER — Ambulatory Visit (HOSPITAL_COMMUNITY): Payer: Self-pay | Admitting: Psychiatry

## 2017-02-18 ENCOUNTER — Telehealth: Payer: Self-pay | Admitting: Family Medicine

## 2017-02-18 ENCOUNTER — Other Ambulatory Visit: Payer: Self-pay

## 2017-02-18 NOTE — Telephone Encounter (Signed)
See 02/17/17 office visit. Needs refill oxycodone from pcp  01/12/17 last refill

## 2017-02-18 NOTE — Telephone Encounter (Signed)
Pt came in on 02/17/17 for her arthritis and came in to get a refill of pain medication. Pt said she was not able to get a refill of the pain medicine without seeing a doctor. She was going to get a shot for the pain but is allergic to aspirin which was in it. She was told to come in on 02/18/17 and she would have a prescription ready for Percocet. Please advise. Best contact number is [phone].

## 2017-02-19 NOTE — Telephone Encounter (Signed)
Pt requesting RF Oxycodone. Please see previous messages

## 2017-02-20 NOTE — Telephone Encounter (Signed)
Dr. Tamala Julian,  I apologize, I thought I had sent this prescription request to you on Thursday. I saw [name] related to her chronic back pain on Thursday. She had wanted a refill of her oxycodone and advise her that I could not refill as this was a controlled medication and our new guidelines only permit the prescribing to refill controlled medications.  I did advise her that I would send you a phone message to refill her medication and that you or someone would contact her to advise when prescription would be ready for pickup. I wanted to give her a Toradol injection but decided against after reviewing her reactions to ibuprofen and aspirin. I increased the frequency of her Robaxin as she was only taking 1 tablet (750 mg) 3 times per day and in changes to 1 tablet every 4 hours as needed for back pain until she is able to obtain a refill of her oxycodone.  Thanks,  Carroll Sage. Kenton Kingfisher, MSN, FNP-C [facility]

## 2017-02-21 ENCOUNTER — Ambulatory Visit: Payer: Self-pay | Admitting: Gastroenterology

## 2017-02-22 ENCOUNTER — Telehealth: Payer: Self-pay

## 2017-02-22 ENCOUNTER — Telehealth: Payer: Self-pay | Admitting: Family Medicine

## 2017-02-22 MED ORDER — OXYCODONE HCL 5 MG PO TABS: 5.0000 mg | ORAL_TABLET | Freq: Three times a day (TID) | ORAL | 0 refills | Status: DC | PRN

## 2017-02-22 NOTE — Telephone Encounter (Signed)
Refill approved and ready for pick up.

## 2017-02-22 NOTE — Telephone Encounter (Signed)
Pt is needing a refill on her hydrocodone   Best number [phone]

## 2017-02-22 NOTE — Telephone Encounter (Signed)
Methocarbamol 42.00  Tizanidine preferred   Want to change

## 2017-02-22 NOTE — Telephone Encounter (Signed)
Call --  rx ready for pick up at [facility].

## 2017-02-22 NOTE — Telephone Encounter (Signed)
Spoke with patient. Notified the Rx is ready for pick up at [facility].

## 2017-02-22 NOTE — Telephone Encounter (Signed)
Looks like dr Tamala Julian refilled today at [facility]?

## 2017-02-23 ENCOUNTER — Other Ambulatory Visit: Payer: Self-pay | Admitting: Family Medicine

## 2017-02-23 MED ORDER — TIZANIDINE HCL 4 MG PO TABS: 4.0000 mg | ORAL_TABLET | Freq: Three times a day (TID) | ORAL | 0 refills | Status: DC

## 2017-02-23 NOTE — Telephone Encounter (Signed)
09/2016 last refill

## 2017-02-23 NOTE — Telephone Encounter (Signed)
I have changed medication to tizadine.

## 2017-03-02 ENCOUNTER — Encounter: Payer: Self-pay | Admitting: Family Medicine

## 2017-03-02 DIAGNOSIS — M549 Dorsalgia, unspecified: Secondary | ICD-10-CM | POA: Diagnosis not present

## 2017-03-03 ENCOUNTER — Emergency Department (HOSPITAL_COMMUNITY)
Admission: EM | Admit: 2017-03-03 | Discharge: 2017-03-03 | Disposition: A | Discharge status: Home / Self Care | Payer: Medicare Other | Attending: Emergency Medicine | Admitting: Emergency Medicine

## 2017-03-03 ENCOUNTER — Emergency Department (HOSPITAL_COMMUNITY): Payer: Medicare Other

## 2017-03-03 DIAGNOSIS — R102 Pelvic and perineal pain: Secondary | ICD-10-CM | POA: Diagnosis not present

## 2017-03-03 DIAGNOSIS — R1030 Lower abdominal pain, unspecified: Secondary | ICD-10-CM | POA: Diagnosis not present

## 2017-03-03 DIAGNOSIS — F17210 Nicotine dependence, cigarettes, uncomplicated: Secondary | ICD-10-CM | POA: Insufficient documentation

## 2017-03-03 DIAGNOSIS — G8929 Other chronic pain: Secondary | ICD-10-CM

## 2017-03-03 DIAGNOSIS — I10 Essential (primary) hypertension: Secondary | ICD-10-CM | POA: Diagnosis not present

## 2017-03-03 DIAGNOSIS — J449 Chronic obstructive pulmonary disease, unspecified: Secondary | ICD-10-CM | POA: Diagnosis not present

## 2017-03-03 LAB — COMPREHENSIVE METABOLIC PANEL
ALBUMIN: 4.1 g/dL (ref 3.5–5.0)
ALK PHOS: 79 U/L (ref 38–126)
ALT: 72 U/L — AB (ref 14–54)
AST: 122 U/L — AB (ref 15–41)
Anion gap: 9 (ref 5–15)
BUN: 5 mg/dL — AB (ref 6–20)
CALCIUM: 9.7 mg/dL (ref 8.9–10.3)
CHLORIDE: 104 mmol/L (ref 101–111)
CO2: 23 mmol/L (ref 22–32)
CREATININE: 0.76 mg/dL (ref 0.44–1.00)
GFR calc Af Amer: >60 mL/min (ref >60)
GFR calc non Af Amer: >60 mL/min (ref >60)
GLUCOSE: 99 mg/dL (ref 65–99)
Potassium: 3.6 mmol/L (ref 3.5–5.1)
SODIUM: 136 mmol/L (ref 135–145)
Total Bilirubin: 0.6 mg/dL (ref 0.3–1.2)
Total Protein: 8 g/dL (ref 6.5–8.1)

## 2017-03-03 LAB — URINALYSIS, ROUTINE W REFLEX MICROSCOPIC
Bacteria, UA: NONE SEEN
Bilirubin Urine: NEGATIVE
Glucose, UA: NEGATIVE mg/dL
Ketones, ur: NEGATIVE mg/dL
Leukocytes, UA: NEGATIVE
Nitrite: NEGATIVE
PH: 6 (ref 5.0–8.0)
PROTEIN: NEGATIVE mg/dL
Specific Gravity, Urine: 1.005 (ref 1.005–1.030)

## 2017-03-03 LAB — LIPASE, BLOOD: LIPASE: 28 U/L (ref 11–51)

## 2017-03-03 LAB — CBC
HCT: 33.6 % — ABNORMAL LOW (ref 36.0–46.0)
Hemoglobin: 11.3 g/dL — ABNORMAL LOW (ref 12.0–15.0)
MCH: 30 pg (ref 26.0–34.0)
MCHC: 33.6 g/dL (ref 30.0–36.0)
MCV: 89.1 fL (ref 78.0–100.0)
PLATELETS: 249 10*3/uL (ref 150–400)
RBC: 3.77 MIL/uL — AB (ref 3.87–5.11)
RDW: 16.1 % — ABNORMAL HIGH (ref 11.5–15.5)
WBC: 6.3 10*3/uL (ref 4.0–10.5)

## 2017-03-03 MED ORDER — NAPROXEN 500 MG PO TABS: 500.0000 mg | ORAL_TABLET | Freq: Two times a day (BID) | ORAL | 0 refills | Status: DC

## 2017-03-03 MED ORDER — ONDANSETRON HCL 4 MG/2ML IJ SOLN
4.0000 mg | Freq: Once | INTRAMUSCULAR | Status: AC
  Administered 2017-03-03: 4 mg via INTRAVENOUS
  Filled 2017-03-03: qty 2

## 2017-03-03 MED ORDER — MORPHINE SULFATE (PF) 4 MG/ML IV SOLN
2.0000 mg | Freq: Once | INTRAVENOUS | Status: AC
  Administered 2017-03-03: 2 mg via INTRAVENOUS
  Filled 2017-03-03: qty 1

## 2017-03-03 NOTE — ED Triage Notes (Signed)
Pt states she has had L lower pelvic pain x more than a month. Nausea. States it does hurt more when she urinates at times. Alert and oriented.

## 2017-03-03 NOTE — ED Provider Notes (Signed)
[facility] DEPT Provider Note   CSN: [id number] Arrival date & time: 03/03/17  0117  By signing my name below, I, Melissa Bridges, attest that this documentation has been prepared under the direction and in the presence of [employer], PA-C.  Electronically Signed: Reola Bridges, ED Scribe. 03/03/17. 2:07 AM.   History   Chief Complaint Chief Complaint  Patient presents with  . Pelvic Pain   The history is provided by the patient. No language interpreter was used.     HPI Comments: Melissa Bridges is a 56 y.o. female with a h/o anemia, sciatica, HTN, GERD, substance abuse, who presents to the Emergency Department complaining of intermittent left suprapubic pain beginning several months ago, worsening and constant beginning ~3 weeks ago. No radiation of pain. She reports associated fever (Tmax 101 last night) and nausea secondary to her pain. She has been taking Oxycodone, prescribed for her h/o sciatica, at home without relief of her pain. Pt has also been taking Naproxen with control of her fever, but without improvement of her pain. Pt has a h/o ovarian cysts and her pain today feels similar to this. Her pain is exacerbated with ambulation. Per prior chart review, she had a pelvic US performed ~2 months ago which showed a normal left ovary. Right ovary was unable to be visualized at that time. Pt's last bowel movement was two days ago which was at her baseline at that time. She has a PSHx to the abdomen including total hysterectomy and ovarian cyst removal. No prior oophorectomies. She denies dysuria, hematuria, vomiting, or any other associated symptoms.    Past Medical History:  Diagnosis Date  . Allergy   . Alopecia   . Anxiety   . Arthritis   . Asthma   . Cataract    "mild"  . COPD (chronic obstructive pulmonary disease) ([place])   . Depression   . GERD (gastroesophageal reflux disease)    o cc- takes OTC if needed.  . Glaucoma   . Hepatitis C   . Hypertension      onset age 27.  . Iron deficiency anemia   . Migraine   . Schizoaffective disorder   . Schizoaffective disorder ([place])   . Sciatic pain   . Seizures ([place])    onset in childhood. 06-03-16- per pt 8 months agoGeneralized tonic-clonic.Last seizure couple of months ago- last sz 9-10 months ago per pt 10-12-2016  . Shortness of breath dyspnea   . Substance abuse   . Ulcer [facility])    Patient Active Problem List   Diagnosis Date Noted  . Serrated adenoma of colon 08/05/2016  . Localization-related idiopathic epilepsy and epileptic syndromes with seizures of localized onset, not intractable, without status epilepticus ([place]) 10/16/2015  . 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 Alcohol abuse 09/06/2014  . Asthma with acute exacerbation 03/09/2013  . Hypertension 03/09/2013  . Lower back pain 03/09/2013  . Chronic hepatitis C without hepatic coma ([place]) 03/08/2013  . Smoker 12/12/2012  . Schizoaffective disorder ([place]) 12/12/2012  . Substance abuse 11/14/2011   Past Surgical History:  Procedure Laterality Date  . ABDOMINAL HYSTERECTOMY     ovarian cyst B, cervical dysplasia, fibroids.   Ovaries intact.  . COLONOSCOPY    . COLONOSCOPY W/ BIOPSIES    . DILATION AND CURETTAGE OF UTERUS     x2  . EXPLORATORY LAPAROTOMY     x3  . GYNECOLOGIC CRYOSURGERY     x3  . MANDIBLE RECONSTRUCTION N/A 06/27/2015   Procedure:  REMOVAL MAXILLARY PALATAL TORUS.  REMOVAL MANDIBULAR TORUS AND EXOSTOSIS.  ;  Surgeon: Diona Browner, DDS;  Location: [facility];  Service: Oral Surgery;  Laterality: N/A;  . OVARIAN CYST REMOVAL  2008  . POLYPECTOMY     OB History    No data available     Home Medications    Prior to Admission medications   Medication Sig Start Date End Date Taking? Authorizing Provider  albuterol (VENTOLIN HFA) 108 (90 Base) MCG/ACT inhaler INHALE TWO PUFFS BY MOUTH EVERY 6 HOURS AS NEEDED FOR SHORTNESS OF BREATH 09/04/16   Wardell Honour, MD  amLODipine (NORVASC) 5 MG tablet Take 1 tablet (5 mg  total) by mouth daily. 09/04/16   Wardell Honour, MD  budesonide-formoterol (SYMBICORT) 80-4.5 MCG/ACT inhaler Inhale 2 puffs into the lungs daily as needed (for shortness of breath). 09/04/16   Wardell Honour, MD  citalopram (CELEXA) 10 MG tablet Take 1 tablet (10 mg total) by mouth daily. For depression 01/13/17   Kathlee Nations, MD  conjugated estrogens (PREMARIN) vaginal cream Place 1 Applicatorful vaginally daily. 09/04/16   Wardell Honour, MD  cyclobenzaprine (FLEXERIL) 5 MG tablet Take 1 tablet (5 mg total) by mouth 3 (three) times daily as needed for muscle spasms. 01/12/17   Wardell Honour, MD  ferrous sulfate 325 (65 FE) MG tablet Take 1 tablet (325 mg total) by mouth daily with breakfast. 09/04/16   Wardell Honour, MD  fluticasone (FLONASE) 50 MCG/ACT nasal spray Place 2 sprays into both nostrils daily. 06/08/16   Dorian Heckle English, PA  ipratropium-albuterol (DUONEB) 0.5-2.5 (3) MG/3ML SOLN Take 3 mLs by nebulization every 4 (four) hours as needed (shortness of breath). Reported on 12/02/2015    Historical Provider, MD  LamoTRIgine (LAMICTAL XR) 25 MG TB24 tablet take 1 tablet daily for 2 weeks, then increase to 2 tablets daily for 2 weeks, then increase to 4 tablets daily then see me in follow-up Patient taking differently: 100 mg at bedtime. take 1 tablet daily for 2 weeks, then increase to 2 tablets daily for 2 weeks, then increase to 4 tablets daily then see me in follow-up 12/02/15   Cameron Sprang, MD  naproxen (NAPROSYN) 500 MG tablet Take 1 tablet (500 mg total) by mouth 2 (two) times daily with a meal. 03/03/17   Antonietta Breach, PA-C  omeprazole (PRILOSEC) 40 MG capsule Take 1 capsule (40 mg total) by mouth daily. 10/26/16   Manus Gunning, MD  oxyCODONE (OXY IR/ROXICODONE) 5 MG immediate release tablet Take 1 tablet (5 mg total) by mouth every 8 (eight) hours as needed for severe pain. 02/22/17   Wardell Honour, MD  QUEtiapine (SEROQUEL) 300 MG tablet Take 1 tablet (300 mg total) by  mouth at bedtime. Mood control 01/13/17   Kathlee Nations, MD  tiZANidine (ZANAFLEX) 4 MG tablet Take 1 tablet (4 mg total) by mouth 3 (three) times daily. 02/23/17   Sedalia Muta, FNP  triamcinolone cream (KENALOG) 0.1 % APPLY  CREAM EXTERNALLY TWICE DAILY 02/28/17   Wardell Honour, MD  valACYclovir (VALTREX) 1000 MG tablet Take 2 tabs po at symptom onset, repeat in 12 hours 09/04/16   Wardell Honour, MD   Family History Family History  Problem Relation Age of Onset  . Diabetes type II Mother   . Hypertension Mother   . Arthritis Mother   . Diabetes Mother   . Mental illness Mother     bipolar  .  Diabetes type II Maternal Aunt   . Hypertension Father   . Heart murmur Father   . Arthritis Father   . Stroke Father   . Alopecia Sister   . Alopecia Brother   . Alopecia Sister   . Mental retardation Sister     depression  . Prostate cancer Paternal Grandfather   . Cancer Maternal Uncle   . Colon cancer Neg Hx   . Esophageal cancer Neg Hx   . Rectal cancer Neg Hx   . Stomach cancer Neg Hx   . Colon polyps Neg Hx    Social History Social History  Substance Use Topics  . Smoking status: Current Every Day Smoker    Packs/day: 0.33    Years: 44.00    Types: Cigarettes  . Smokeless tobacco: Never Used  . Alcohol use 0.0 oz/week     Comment: occasionally   Allergies   Fruit & vegetable daily [nutritional supplements]; Iodinated diagnostic agents; Ibuprofen; and Aspirin  Review of Systems Review of Systems  Constitutional: Positive for fever.  Gastrointestinal: Positive for nausea. Negative for vomiting.  Genitourinary: Positive for pelvic pain. Negative for dysuria and hematuria.  A complete 10 system review of systems was obtained and all systems are negative except as noted in the HPI and PMH.    Physical Exam Updated Vital Signs BP (!) 162/98 (BP Location: Left Arm)   Pulse 80   Temp 97.7 F (36.5 C) (Oral)   Resp 20   Ht 5\' 3"  (1.6 m)   Wt 53.1 kg   SpO2  100%   BMI 20.73 kg/m   Physical Exam  Constitutional: She is oriented to person, place, and time. She appears well-developed and well-nourished. No distress.  Nontoxic and in NAD  HENT:  Head: Normocephalic and atraumatic.  Eyes: Conjunctivae and EOM are normal. No scleral icterus.  Neck: Normal range of motion.  Cardiovascular: Normal rate, regular rhythm and intact distal pulses.   Pulmonary/Chest: Effort normal. No respiratory distress. She has no wheezes.  Respirations even and unlabored  Abdominal: Soft. She exhibits no distension and no mass. There is tenderness.    Soft abdomen with left suprapubic and inguinal TTP. No masses. No peritoneal signs.  Musculoskeletal: Normal range of motion.  Neurological: She is alert and oriented to person, place, and time. She exhibits normal muscle tone. Coordination normal.  GCS 15. Patient moving all extremities.  Skin: Skin is warm and dry. No rash noted. She is not diaphoretic. No erythema. No pallor.  Psychiatric: She has a normal mood and affect. Her behavior is normal.  Nursing note and vitals reviewed.   ED Treatments / Results  DIAGNOSTIC STUDIES: Oxygen Saturation is 100% on RA, normal by my interpretation.   COORDINATION OF CARE: 2:06 AM-Discussed next steps with pt. Pt verbalized understanding and is agreeable with the plan.   Labs (all labs ordered are listed, but only abnormal results are displayed) Labs Reviewed  COMPREHENSIVE METABOLIC PANEL - Abnormal; Notable for the following:       Result Value   BUN 5 (*)    AST 122 (*)    ALT 72 (*)    All other components within normal limits  CBC - Abnormal; Notable for the following:    RBC 3.77 (*)    Hemoglobin 11.3 (*)    HCT 33.6 (*)    RDW 16.1 (*)    All other components within normal limits  URINALYSIS, ROUTINE W REFLEX MICROSCOPIC - Abnormal; Notable for the  following:    Hgb urine dipstick SMALL (*)    Squamous Epithelial / LPF 0-5 (*)    All other  components within normal limits  LIPASE, BLOOD   EKG  EKG Interpretation None      Radiology Ct Abdomen Pelvis Wo Contrast  Result Date: 03/03/2017 CLINICAL DATA:  Left groin pain EXAM: CT ABDOMEN AND PELVIS WITHOUT CONTRAST TECHNIQUE: Multidetector CT imaging of the abdomen and pelvis was performed following the standard protocol without IV contrast. COMPARISON:  Ultrasound pelvis 01/03/2017 CT abdomen pelvis 08/25/2015 FINDINGS: Lower chest: No pulmonary nodules. No visible pleural or pericardial effusion. Bibasilar dependent atelectasis. Hepatobiliary: Normal hepatic size and contours. No perihepatic ascites. No intra- or extrahepatic biliary dilatation. Normal gallbladder. Pancreas: Normal pancreatic contours. No peripancreatic fluid collection or pancreatic ductal dilatation. Spleen: Normal. Adrenals/Urinary Tract: Normal adrenal glands. No hydronephrosis or nephrolithiasis. No abnormal perinephric stranding. No ureteral obstruction. Stomach/Bowel: No abnormal bowel dilatation. No bowel wall thickening or adjacent fat stranding to indicate acute inflammation. No abdominal fluid collection. Normal appendix. Vascular/Lymphatic: There is atherosclerotic calcification of the non aneurysmal abdominal aorta. No abdominal or pelvic adenopathy. Reproductive: Status post hysterectomy.  No adnexal mass. Musculoskeletal: Multilevel lumbar osteophytosis and facet arthrosis without bony spinal canal stenosis. Normal visualized extrathoracic and extraperitoneal soft tissues. Other: No contributory non-categorized findings. IMPRESSION: No acute abnormality of the abdomen or pelvis. Electronically Signed   By: Ulyses Jarred M.D.   On: 03/03/2017 05:05    Procedures Procedures   Medications Ordered in ED Medications  morphine 4 MG/ML injection 2 mg (not administered)  ondansetron (ZOFRAN) injection 4 mg (not administered)    Initial Impression / Assessment and Plan / ED Course  I have reviewed the  triage vital signs and the nursing notes.  Pertinent labs & imaging results that were available during my care of the patient were reviewed by me and considered in my medical decision making (see chart for details).     56 year old female presents to the emergency department for evaluation of several months of pelvic pain. She reports persistence of the pain with worsening over the past 3 weeks. Patient nontoxic appearing and afebrile. Laboratory workup reviewed which is reassuring. It is stable compared with prior evaluations. Patient with ultrasound of her adnexa in January which was reassuring. No full CT since 2016. Given location of pain, CT was ordered to rule out hernia.  CT imaging reviewed which is also reassuring and without evidence of acute infectious or surgical process. Patient resting comfortably on repeat assessment. Her vitals have been stable. Giving chronicity of symptoms, I believe the patient is appropriate for further follow-up with her primary doctor. She states that she has oxycodone at home to take for pain. Will prescribe naproxen. Return precautions discussed and provided. Patient discharged in stable condition with no unaddressed concerns.   Final Clinical Impressions(s) / ED Diagnoses   Final diagnoses:  Chronic pelvic pain in female   New Prescriptions Current Discharge Medication List      I personally performed the services described in this documentation, which was scribed in my presence. The recorded information has been reviewed and is accurate.       Antonietta Breach, PA-C 03/03/17 [id number]    Sherwood Gambler, MD 03/12/17 1539

## 2017-03-03 NOTE — Discharge Instructions (Signed)
Your blood work and imaging did not reveal any concerning causes of your pain today. We advised the use of naproxen as prescribed for persistent discomfort. Follow-up with your primary care doctor to ensure resolution of symptoms.

## 2017-03-17 ENCOUNTER — Ambulatory Visit: Payer: Self-pay | Admitting: Gastroenterology

## 2017-04-06 ENCOUNTER — Ambulatory Visit (INDEPENDENT_AMBULATORY_CARE_PROVIDER_SITE_OTHER): Payer: Medicare Other | Admitting: Family Medicine

## 2017-04-06 ENCOUNTER — Encounter: Payer: Self-pay | Admitting: Family Medicine

## 2017-04-06 VITALS — BP 137/78 | HR 76 | Temp 98.3°F | Resp 16 | Ht 63.75 in | Wt 125.8 lb

## 2017-04-06 DIAGNOSIS — Z23 Encounter for immunization: Secondary | ICD-10-CM | POA: Diagnosis not present

## 2017-04-06 DIAGNOSIS — B182 Chronic viral hepatitis C: Secondary | ICD-10-CM | POA: Diagnosis not present

## 2017-04-06 DIAGNOSIS — G894 Chronic pain syndrome: Secondary | ICD-10-CM | POA: Diagnosis not present

## 2017-04-06 DIAGNOSIS — M5442 Lumbago with sciatica, left side: Secondary | ICD-10-CM

## 2017-04-06 DIAGNOSIS — G8929 Other chronic pain: Secondary | ICD-10-CM | POA: Diagnosis not present

## 2017-04-06 DIAGNOSIS — I10 Essential (primary) hypertension: Secondary | ICD-10-CM

## 2017-04-06 DIAGNOSIS — G40009 Localization-related (focal) (partial) idiopathic epilepsy and epileptic syndromes with seizures of localized onset, not intractable, without status epilepticus: Secondary | ICD-10-CM

## 2017-04-06 DIAGNOSIS — F250 Schizoaffective disorder, bipolar type: Secondary | ICD-10-CM | POA: Diagnosis not present

## 2017-04-06 DIAGNOSIS — Z01818 Encounter for other preprocedural examination: Secondary | ICD-10-CM | POA: Diagnosis not present

## 2017-04-06 LAB — POCT URINALYSIS DIP (MANUAL ENTRY)
BILIRUBIN UA: NEGATIVE
Blood, UA: NEGATIVE
GLUCOSE UA: NEGATIVE mg/dL
Ketones, POC UA: NEGATIVE mg/dL
LEUKOCYTES UA: NEGATIVE
NITRITE UA: NEGATIVE
PH UA: 7 (ref 5.0–8.0)
Protein Ur, POC: NEGATIVE mg/dL
Spec Grav, UA: 1.02 (ref 1.010–1.025)
Urobilinogen, UA: 0.2 E.U./dL

## 2017-04-06 MED ORDER — OXYCODONE HCL 5 MG PO TABS: 5.0000 mg | ORAL_TABLET | Freq: Three times a day (TID) | ORAL | 0 refills | Status: DC | PRN

## 2017-04-06 NOTE — Progress Notes (Signed)
Subjective:    Patient ID: Melissa Bridges, female    DOB: [birth year], 58 y.o.   MRN: [id number]  04/06/2017  Back Pain (lower back pain x 1 month, pain in groin area)   HPI This 56 y.o. female presents for persistent lower back pain.   Not able to get around like could previously.  Acute worsening of lower back pain.  Leg giving out.  Went to emergency room; medication helped a lot.  Went to scoliosis and spine MD; returns on 04/20/17.  Explained everything.  Daughter wants pt to go to physical therapy yet daughter returning to school.  Recommending surgery.  Not leaving the house; getting depressed due to limited activity.  Cannot drive in the car because lower back pain worsens.  Returns on 04/12/17 to see psychiatry.  Neurosurgeon recommended follow-up with PCP for pain management until after surgery performed.    Tingling in anterior thigh.  Started sneezing with pollen.  s/p CT abd/pelvis 03/03/17; negative. s/p pelvic ultrasound 01/03/17 negative.  Normal LEFT ovary.  [place] Controlled substance: 3/13 Oxycodone 5 60 Siddhanth Denk 1/31 oxycodone 60 1/23 oxycodone 10  Wt Readings from Last 3 Encounters:  04/06/17 125 lb 12.8 oz (57.1 kg)  03/03/17 117 lb (53.1 kg)  02/17/17 121 lb 3.2 oz (55 kg)   BP Readings from Last 3 Encounters:  04/06/17 137/78  03/03/17 (!) 149/80  02/17/17 (!) 153/88   Immunization History  Administered Date(s) Administered  . Hepatitis A, Adult 05/11/2016, 04/06/2017  . Influenza,inj,Quad PF,36+ Mos 09/29/2015, 09/04/2016  . Pneumococcal Polysaccharide-23 11/12/2011  . Tdap 05/11/2016    Review of Systems  Constitutional: Negative for chills, diaphoresis, fatigue and fever.  Eyes: Negative for visual disturbance.  Respiratory: Negative for cough and shortness of breath.   Cardiovascular: Negative for chest pain, palpitations and leg swelling.  Gastrointestinal: Negative for abdominal distention, abdominal pain, anal bleeding, blood in stool, constipation,  diarrhea, nausea, rectal pain and vomiting.  Endocrine: Negative for cold intolerance, heat intolerance, polydipsia, polyphagia and polyuria.  Genitourinary: Positive for pelvic pain. Negative for decreased urine volume, difficulty urinating, dyspareunia, dysuria, enuresis, flank pain, frequency, genital sores, hematuria, menstrual problem, urgency, vaginal bleeding, vaginal discharge and vaginal pain.  Musculoskeletal: Positive for back pain.  Neurological: Positive for numbness. Negative for dizziness, tremors, seizures, syncope, facial asymmetry, speech difficulty, weakness, light-headedness and headaches.  Psychiatric/Behavioral: Positive for dysphoric mood.    Past Medical History:  Diagnosis Date  . Allergy   . Alopecia   . Anxiety   . Arthritis   . Asthma   . Cataract    "mild"  . COPD (chronic obstructive pulmonary disease) ([facility])   . Depression   . GERD (gastroesophageal reflux disease)    o cc- takes OTC if needed.  . Glaucoma   . Hepatitis C   . Hypertension    onset age 59.  . Iron deficiency anemia   . Migraine   . Schizoaffective disorder   . Schizoaffective disorder ([facility])   . Sciatic pain   . Seizures ([facility])    onset in childhood. 06-03-16- per pt 8 months agoGeneralized tonic-clonic.Last seizure couple of months ago- last sz 9-10 months ago per pt 10-12-2016  . Shortness of breath dyspnea   . Substance abuse   . Ulcer    Past Surgical History:  Procedure Laterality Date  . ABDOMINAL HYSTERECTOMY     ovarian cyst B, cervical dysplasia, fibroids.   Ovaries intact.  . COLONOSCOPY    . COLONOSCOPY W/ BIOPSIES    .  DILATION AND CURETTAGE OF UTERUS     x2  . EXPLORATORY LAPAROTOMY     x3  . GYNECOLOGIC CRYOSURGERY     x3  . MANDIBLE RECONSTRUCTION N/A 06/27/2015   Procedure: REMOVAL MAXILLARY PALATAL TORUS.  REMOVAL MANDIBULAR TORUS AND EXOSTOSIS.  ;  Surgeon: Ocie Doyne, DDS;  Location: [facility];  Service: Oral Surgery;  Laterality: N/A;  . OVARIAN CYST REMOVAL   2008  . POLYPECTOMY     Allergies  Allergen Reactions  . Fruit & Vegetable Daily [Nutritional Supplements] Shortness Of Breath    Aloe  . Iodinated Diagnostic Agents Swelling    Swelling and itching of left side of her face only after CT SI injection(no steroid used)  . Ibuprofen Other (See Comments)    Stomach upset; However, pt can take Meloxicam without incident and takes this at home.   . Aspirin Rash and Other (See Comments)    Stomach upset    Social History   Social History  . Marital status: Widowed    Spouse name: N/A  . Number of children: 1  . Years of education: N/A   Occupational History  . disabled     mental illness; seizures   Social History Main Topics  . Smoking status: Current Every Day Smoker    Packs/day: 0.33    Years: 44.00    Types: Cigarettes  . Smokeless tobacco: Never Used  . Alcohol use 0.0 oz/week     Comment: occasionally  . Drug use: No     Comment: no longer using  . Sexual activity: Not Currently    Birth control/ protection: None     Comment: widow   Other Topics Concern  . Not on file   Social History Narrative   Marital status:  Widowed since 2002.  Married x 16 years. + dating.  Moved from [place] to live with daughter in 2013.     Children:  One child/daughter (33); two grandchildren.      Lives: with daughter, grandchildren 2.  Does not drive due to epilepsy.      Employment:  Disability for schizoaffective disorder.      Tobacco: 1 ppd x since 8th grade.      Alcohol:  Social; rare drinking due to seizure medications.  Weekends.       Drugs: none; previous use of marijuana.  Previous iv drug use, cocaine.      Exercise: none      Seatbelt:  100%      Guns: none   Family History  Problem Relation Age of Onset  . Diabetes type II Mother   . Hypertension Mother   . Arthritis Mother   . Diabetes Mother   . Mental illness Mother        bipolar  . Diabetes type II Maternal Aunt   . Hypertension Father   . Heart  murmur Father   . Arthritis Father   . Stroke Father   . Alopecia Sister   . Alopecia Brother   . Alopecia Sister   . Mental retardation Sister        depression  . Prostate cancer Paternal Grandfather   . Cancer Maternal Uncle   . Colon cancer Neg Hx   . Esophageal cancer Neg Hx   . Rectal cancer Neg Hx   . Stomach cancer Neg Hx   . Colon polyps Neg Hx        Objective:    BP 137/78 (BP Location: Right Arm,  Patient Position: Sitting, Cuff Size: Small)   Pulse 76   Temp 98.3 F (36.8 C) (Oral)   Resp 16   Ht 5' 3.75" (1.619 m)   Wt 125 lb 12.8 oz (57.1 kg)   SpO2 98%   BMI 21.76 kg/m  Physical Exam  Constitutional: She is oriented to person, place, and time. She appears well-developed and well-nourished. No distress.  HENT:  Head: Normocephalic and atraumatic.  Right Ear: External ear normal.  Left Ear: External ear normal.  Nose: Nose normal.  Mouth/Throat: Oropharynx is clear and moist.  Eyes: Conjunctivae and EOM are normal. Pupils are equal, round, and reactive to light.  Neck: Normal range of motion. Neck supple. Carotid bruit is not present. No thyromegaly present.  Cardiovascular: Normal rate, regular rhythm, normal heart sounds and intact distal pulses.  Exam reveals no gallop and no friction rub.   No murmur heard. Pulmonary/Chest: Effort normal and breath sounds normal. She has no wheezes. She has no rales.  Abdominal: Soft. Bowel sounds are normal. She exhibits no distension and no mass. There is no tenderness. There is no rebound and no guarding.  Musculoskeletal:       Lumbar back: She exhibits decreased range of motion, pain and spasm. She exhibits no tenderness, no bony tenderness and normal pulse.  Lymphadenopathy:    She has no cervical adenopathy.  Neurological: She is alert and oriented to person, place, and time. No cranial nerve deficit.  Skin: Skin is warm and dry. No rash noted. She is not diaphoretic. No erythema. No pallor.  Psychiatric: She  has a normal mood and affect. Her behavior is normal.        Assessment & Plan:   1. Chronic left-sided low back pain with left-sided sciatica   2. Preoperative clearance   3. Essential hypertension, benign   4. Chronic pain syndrome   5. Need for prophylactic vaccination and inoculation against viral hepatitis   6. Chronic hepatitis C without hepatic coma ([facility])   7. Localization-related idiopathic epilepsy and epileptic syndromes with seizures of localized onset, not intractable, without status epilepticus ([facility])   8. Schizoaffective disorder, bipolar type ([facility])    -worsening; s/p consultation by neurosurgeon who is recommending surgery. -obtain EKG and labs for pre-operative clearance. EKG WNL; asymptomatic.  -recommend smoking cessation. -s/p Hepatitis A#2  due to Hepatitis C. -obtain UDS.   Orders Placed This Encounter  Procedures  . Hepatitis A vaccine adult IM  . CBC with Differential/Platelet  . Comprehensive metabolic panel  . Pain Management Screening Profile (10S)  . POCT urinalysis dipstick  . EKG 12-Lead   Meds ordered this encounter  Medications  . oxyCODONE (OXY IR/ROXICODONE) 5 MG immediate release tablet    Sig: Take 1 tablet (5 mg total) by mouth every 8 (eight) hours as needed for severe pain.    Dispense:  60 tablet    Refill:  0    No Follow-up on file.   Shonita Rinck Paulita Fujita, M.D. Primary Care at [facility] previously [facility] [address] [place], [place]  [address] [phone] phone [phone] fax

## 2017-04-07 LAB — COMPREHENSIVE METABOLIC PANEL WITH GFR
ALT: 67 [IU]/L — ABNORMAL HIGH (ref 0–32)
AST: 143 [IU]/L — ABNORMAL HIGH (ref 0–40)
Albumin/Globulin Ratio: 1.2 (ref 1.2–2.2)
Albumin: 4.5 g/dL (ref 3.5–5.5)
Alkaline Phosphatase: 99 [IU]/L (ref 39–117)
BUN/Creatinine Ratio: 10 (ref 9–23)
BUN: 8 mg/dL (ref 6–24)
Bilirubin Total: <0.2 mg/dL (ref 0.0–1.2)
CO2: 24 mmol/L (ref 18–29)
Calcium: 9.7 mg/dL (ref 8.7–10.2)
Chloride: 99 mmol/L (ref 96–106)
Creatinine, Ser: 0.8 mg/dL (ref 0.57–1.00)
GFR calc Af Amer: 94 mL/min/{1.73_m2}
GFR calc non Af Amer: 82 mL/min/{1.73_m2}
Globulin, Total: 3.7 g/dL (ref 1.5–4.5)
Glucose: 85 mg/dL (ref 65–99)
Potassium: 4.2 mmol/L (ref 3.5–5.2)
Sodium: 139 mmol/L (ref 134–144)
Total Protein: 8.2 g/dL (ref 6.0–8.5)

## 2017-04-07 LAB — CBC WITH DIFFERENTIAL/PLATELET
BASOS ABS: 0 10*3/uL (ref 0.0–0.2)
BASOS: 0 %
EOS (ABSOLUTE): 0.1 10*3/uL (ref 0.0–0.4)
Eos: 2 %
HEMOGLOBIN: 11.3 g/dL (ref 11.1–15.9)
Hematocrit: 36.2 % (ref 34.0–46.6)
IMMATURE GRANS (ABS): 0 10*3/uL (ref 0.0–0.1)
IMMATURE GRANULOCYTES: 0 %
LYMPHS: 31 %
Lymphocytes Absolute: 2.1 10*3/uL (ref 0.7–3.1)
MCH: 28.4 pg (ref 26.6–33.0)
MCHC: 31.2 g/dL — ABNORMAL LOW (ref 31.5–35.7)
MCV: 91 fL (ref 79–97)
MONOCYTES: 8 %
Monocytes Absolute: 0.5 10*3/uL (ref 0.1–0.9)
NEUTROS PCT: 59 %
Neutrophils Absolute: 4.1 10*3/uL (ref 1.4–7.0)
PLATELETS: 300 10*3/uL (ref 150–379)
RBC: 3.98 x10E6/uL (ref 3.77–5.28)
RDW: 19.3 % — ABNORMAL HIGH (ref 12.3–15.4)
WBC: 6.9 10*3/uL (ref 3.4–10.8)

## 2017-04-07 LAB — PMP SCREEN PROFILE (10S), URINE
Amphetamine Screen, Ur: NEGATIVE ng/mL
Barbiturate Screen, Ur: NEGATIVE ng/mL
Benzodiazepine Screen, Urine: NEGATIVE ng/mL
Cannabinoids Ur Ql Scn: NEGATIVE ng/mL
Cocaine(Metab.)Screen, Urine: NEGATIVE ng/mL
Creatinine(Crt), U: 162.1 mg/dL (ref 20.0–300.0)
Methadone Scn, Ur: NEGATIVE ng/mL
Opiate Scrn, Ur: NEGATIVE ng/mL
Oxycodone+Oxymorphone Ur Ql Scn: NEGATIVE ng/mL
PCP Scrn, Ur: NEGATIVE ng/mL
Ph of Urine: 7.8 (ref 4.5–8.9)
Propoxyphene, Screen: NEGATIVE ng/mL

## 2017-04-12 ENCOUNTER — Ambulatory Visit (HOSPITAL_COMMUNITY): Payer: Self-pay | Admitting: Psychiatry

## 2017-04-20 ENCOUNTER — Ambulatory Visit: Payer: Self-pay | Admitting: Gastroenterology

## 2017-04-25 ENCOUNTER — Encounter: Payer: Self-pay | Admitting: Family Medicine

## 2017-04-27 ENCOUNTER — Ambulatory Visit (INDEPENDENT_AMBULATORY_CARE_PROVIDER_SITE_OTHER): Payer: Medicare Other | Admitting: Psychiatry

## 2017-04-27 ENCOUNTER — Encounter (HOSPITAL_COMMUNITY): Payer: Self-pay | Admitting: Psychiatry

## 2017-04-27 DIAGNOSIS — Z79899 Other long term (current) drug therapy: Secondary | ICD-10-CM | POA: Diagnosis not present

## 2017-04-27 DIAGNOSIS — F1011 Alcohol abuse, in remission: Secondary | ICD-10-CM | POA: Diagnosis not present

## 2017-04-27 DIAGNOSIS — F250 Schizoaffective disorder, bipolar type: Secondary | ICD-10-CM | POA: Diagnosis not present

## 2017-04-27 DIAGNOSIS — F1921 Other psychoactive substance dependence, in remission: Secondary | ICD-10-CM

## 2017-04-27 DIAGNOSIS — Z818 Family history of other mental and behavioral disorders: Secondary | ICD-10-CM

## 2017-04-27 DIAGNOSIS — Z810 Family history of intellectual disabilities: Secondary | ICD-10-CM | POA: Diagnosis not present

## 2017-04-27 DIAGNOSIS — F17210 Nicotine dependence, cigarettes, uncomplicated: Secondary | ICD-10-CM | POA: Diagnosis not present

## 2017-04-27 DIAGNOSIS — M1612 Unilateral primary osteoarthritis, left hip: Secondary | ICD-10-CM | POA: Diagnosis not present

## 2017-04-27 DIAGNOSIS — M5416 Radiculopathy, lumbar region: Secondary | ICD-10-CM | POA: Diagnosis not present

## 2017-04-27 DIAGNOSIS — F4310 Post-traumatic stress disorder, unspecified: Secondary | ICD-10-CM

## 2017-04-27 DIAGNOSIS — Z79891 Long term (current) use of opiate analgesic: Secondary | ICD-10-CM

## 2017-04-27 MED ORDER — CITALOPRAM HYDROBROMIDE 10 MG PO TABS: 10.0000 mg | ORAL_TABLET | Freq: Every day | ORAL | 0 refills | Status: DC

## 2017-04-27 MED ORDER — QUETIAPINE FUMARATE 300 MG PO TABS: 300.0000 mg | ORAL_TABLET | Freq: Every day | ORAL | 0 refills | Status: DC
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
## 2017-04-30 DIAGNOSIS — M545 Low back pain: Secondary | ICD-10-CM | POA: Diagnosis not present

## 2017-05-03 ENCOUNTER — Other Ambulatory Visit: Payer: Self-pay | Admitting: Orthopaedic Surgery

## 2017-05-03 DIAGNOSIS — M5416 Radiculopathy, lumbar region: Secondary | ICD-10-CM

## 2017-05-10 ENCOUNTER — Other Ambulatory Visit: Payer: Self-pay

## 2017-05-20 ENCOUNTER — Encounter: Payer: Self-pay | Admitting: Family Medicine

## 2017-05-20 ENCOUNTER — Ambulatory Visit (INDEPENDENT_AMBULATORY_CARE_PROVIDER_SITE_OTHER): Payer: Medicare Other | Admitting: Family Medicine

## 2017-05-20 VITALS — BP 177/92 | HR 106 | Temp 98.7°F | Resp 16 | Ht 63.0 in | Wt 121.8 lb

## 2017-05-20 DIAGNOSIS — B182 Chronic viral hepatitis C: Secondary | ICD-10-CM | POA: Diagnosis not present

## 2017-05-20 DIAGNOSIS — M5441 Lumbago with sciatica, right side: Secondary | ICD-10-CM

## 2017-05-20 DIAGNOSIS — M5126 Other intervertebral disc displacement, lumbar region: Secondary | ICD-10-CM | POA: Diagnosis not present

## 2017-05-20 DIAGNOSIS — G8929 Other chronic pain: Secondary | ICD-10-CM

## 2017-05-20 DIAGNOSIS — F17200 Nicotine dependence, unspecified, uncomplicated: Secondary | ICD-10-CM | POA: Diagnosis not present

## 2017-05-20 DIAGNOSIS — F1910 Other psychoactive substance abuse, uncomplicated: Secondary | ICD-10-CM

## 2017-05-20 MED ORDER — OXYCODONE HCL 5 MG PO TABS: 5.0000 mg | ORAL_TABLET | Freq: Three times a day (TID) | ORAL | 0 refills | Status: DC | PRN

## 2017-05-20 NOTE — Progress Notes (Signed)
Subjective:    Patient ID: Melissa Bridges, female    DOB: 10-04-1961, 56 y.o.   MRN: [id number]  05/20/2017  Follow-up (left side sciatica, "still hurting")   HPI This 56 y.o. female presents for six week follow-up of DDD lumbar spine with chronic pain syndrome.     S/p L HIP MRI; normal hip MRI.  NS sent back to [facility] for repeat evalutaion for L hip.  Dumonski feels symptoms are all related to back.   Dr. Patrice Paradise scheduled repeat MRI 05/23/2017. Not sure if surgery indicated.   Dr. Patrice Paradise prescribed prednisone.  Decreased pain.     Has been approved for a room at the [facility] for mental illness; helps getting to appointments. No smoking or drinking.  Just had an interview.  Really happy about moving out.  Has never lived on own.  No boyfriend right now.  Has never lived on own.  Moving end by June 12, 2017,  [place] Controlled Substance Registry: 04/06/17 oxycodone 5mg  60 [place] 02/22/17 oxycodone 5mg  60 [place]  01/12/17 oxycodone 60 sm[place] 01/04/17 oxycodone 10 [place]   Immunization History  Administered Date(s) Administered  . Hepatitis A, Adult 05/11/2016, 04/06/2017  . Influenza,inj,Quad PF,36+ Mos 09/29/2015, 09/04/2016  . Pneumococcal Polysaccharide-23 11/12/2011  . Tdap 05/11/2016   BP Readings from Last 3 Encounters:  05/20/17 (!) 177/92  04/06/17 137/78  03/03/17 (!) 149/80   Wt Readings from Last 3 Encounters:  05/20/17 121 lb 12.8 oz (55.2 kg)  04/06/17 125 lb 12.8 oz (57.1 kg)  03/03/17 117 lb (53.1 kg)    Review of Systems  Constitutional: Negative for chills, diaphoresis, fatigue and fever.  Eyes: Negative for visual disturbance.  Respiratory: Positive for choking. Negative for cough and shortness of breath.   Cardiovascular: Negative for chest pain, palpitations and leg swelling.  Gastrointestinal: Negative for abdominal pain, constipation, diarrhea, nausea and vomiting.  Endocrine: Negative for cold intolerance, heat intolerance, polydipsia, polyphagia and  polyuria.  Musculoskeletal: Positive for back pain and gait problem.  Neurological: Negative for dizziness, tremors, seizures, syncope, facial asymmetry, speech difficulty, weakness, light-headedness, numbness and headaches.    Past Medical History:  Diagnosis Date  . Allergy   . Alopecia   . Anxiety   . Arthritis   . Asthma   . Cataract    "mild"  . COPD (chronic obstructive pulmonary disease) ([place])   . Depression   . GERD (gastroesophageal reflux disease)    o cc- takes OTC if needed.  . Glaucoma   . Hepatitis C   . Hypertension    onset age 5.  . Iron deficiency anemia   . Migraine   . Schizoaffective disorder   . Schizoaffective disorder ([place])   . Sciatic pain   . Seizures ([place])    onset in childhood. 06-03-16- per pt 8 months agoGeneralized tonic-clonic.Last seizure couple of months ago- last sz 9-10 months ago per pt 10-12-2016  . Shortness of breath dyspnea   . Substance abuse   . Ulcer    Past Surgical History:  Procedure Laterality Date  . ABDOMINAL HYSTERECTOMY     ovarian cyst B, cervical dysplasia, fibroids.   Ovaries intact.  . COLONOSCOPY    . COLONOSCOPY W/ BIOPSIES    . DILATION AND CURETTAGE OF UTERUS     x2  . EXPLORATORY LAPAROTOMY     x3  . GYNECOLOGIC CRYOSURGERY     x3  . MANDIBLE RECONSTRUCTION N/A 06/27/2015   Procedure: REMOVAL MAXILLARY PALATAL TORUS.  REMOVAL  MANDIBULAR TORUS AND EXOSTOSIS.  ;  Surgeon: Diona Browner, DDS;  Location: [facility];  Service: Oral Surgery;  Laterality: N/A;  . OVARIAN CYST REMOVAL  2008  . POLYPECTOMY     Allergies  Allergen Reactions  . Fruit & Vegetable Daily [Nutritional Supplements] Shortness Of Breath    Aloe  . Iodinated Diagnostic Agents Swelling    Swelling and itching of left side of her face only after CT SI injection(no steroid used)  . Ibuprofen Other (See Comments)    Stomach upset; However, pt can take Meloxicam without incident and takes this at home.   . Aspirin Rash and Other (See Comments)     Stomach upset    Social History   Social History  . Marital status: Widowed    Spouse name: N/A  . Number of children: 1  . Years of education: N/A   Occupational History  . disabled     mental illness; seizures   Social History Main Topics  . Smoking status: Current Every Day Smoker    Packs/day: 0.33    Years: 44.00    Types: Cigarettes  . Smokeless tobacco: Never Used  . Alcohol use 0.0 oz/week     Comment: occasionally  . Drug use: No     Comment: no longer using  . Sexual activity: Not Currently    Birth control/ protection: None     Comment: widow   Other Topics Concern  . Not on file   Social History Narrative   Marital status:  Widowed since 2002.  Married x 16 years. + dating.  Moved from [place] to live with daughter in 2013.     Children:  One child/daughter (33); two grandchildren.      Lives: with daughter, grandchildren 2.  Does not drive due to epilepsy.      Employment:  Disability for schizoaffective disorder.      Tobacco: 1 ppd x since 8th grade.      Alcohol:  Social; rare drinking due to seizure medications.  Weekends.       Drugs: none; previous use of marijuana.  Previous iv drug use, cocaine.      Exercise: none      Seatbelt:  100%      Guns: none   Family History  Problem Relation Age of Onset  . Diabetes type II Mother   . Hypertension Mother   . Arthritis Mother   . Diabetes Mother   . Mental illness Mother        bipolar  . Diabetes type II Maternal Aunt   . Hypertension Father   . Heart murmur Father   . Arthritis Father   . Stroke Father   . Alopecia Sister   . Alopecia Brother   . Alopecia Sister   . Mental retardation Sister        depression  . Prostate cancer Paternal Grandfather   . Cancer Maternal Uncle   . Colon cancer Neg Hx   . Esophageal cancer Neg Hx   . Rectal cancer Neg Hx   . Stomach cancer Neg Hx   . Colon polyps Neg Hx        Objective:    BP (!) 177/92 (BP Location: Left Arm, Patient  Position: Sitting, Cuff Size: Small)   Pulse (!) 106   Temp 98.7 F (37.1 C) (Oral)   Resp 16   Ht 5\' 3"  (1.6 m)   Wt 121 lb 12.8 oz (55.2 kg)   SpO2  98%   BMI 21.58 kg/m  Physical Exam  Constitutional: She is oriented to person, place, and time. She appears well-developed and well-nourished. No distress.  HENT:  Head: Normocephalic and atraumatic.  Right Ear: External ear normal.  Left Ear: External ear normal.  Nose: Nose normal.  Mouth/Throat: Oropharynx is clear and moist.  Eyes: Conjunctivae and EOM are normal. Pupils are equal, round, and reactive to light.  Neck: Normal range of motion. Neck supple. Carotid bruit is not present. No thyromegaly present.  Cardiovascular: Normal rate, regular rhythm, normal heart sounds and intact distal pulses.  Exam reveals no gallop and no friction rub.   No murmur heard. Pulmonary/Chest: Effort normal and breath sounds normal. She has no wheezes. She has no rales.  Abdominal: Soft. Bowel sounds are normal. She exhibits no distension and no mass. There is no tenderness. There is no rebound and no guarding.  Musculoskeletal:       Lumbar back: She exhibits decreased range of motion, pain and spasm. She exhibits no tenderness, no bony tenderness and normal pulse.  Lymphadenopathy:    She has no cervical adenopathy.  Neurological: She is alert and oriented to person, place, and time. No cranial nerve deficit. She exhibits normal muscle tone. Coordination normal.  Skin: Skin is warm and dry. No rash noted. She is not diaphoretic. No erythema. No pallor.  Psychiatric: She has a normal mood and affect. Her behavior is normal. Thought content normal.        Assessment & Plan:   1. Lumbar disc herniation   2. Chronic right-sided low back pain with right-sided sciatica   3. Chronic hepatitis C without hepatic coma (HCC)   4. Smoker   5. Substance abuse    -ongoing lower back pain; undergoing consultation by orthopedics and NS. -controlled;  refill of oxycodone provided.   -pan contract formalized during visit and signed by patient. -UDS obtained in 03/2017.   -encourage smoking cessation with upcoming potential of lumbar spine surgery; pt expressed understanding.  No orders of the defined types were placed in this encounter.  Meds ordered this encounter  Medications  . DISCONTD: oxyCODONE (OXY IR/ROXICODONE) 5 MG immediate release tablet    Sig: Take 1 tablet (5 mg total) by mouth every 8 (eight) hours as needed for severe pain.    Dispense:  60 tablet    Refill:  0    No Follow-up on file.   Kristi Elayne Guerin, M.D. Primary Care at [facility] [address] [place], [place]  [address] [phone] phone [phone] fax

## 2017-05-20 NOTE — Patient Instructions (Signed)
     IF you received an x-ray today, you will receive an invoice from [facility]. Please contact [facility] at [phone] with questions or concerns regarding your invoice.   IF you received labwork today, you will receive an invoice from [facility]. Please contact [facility] at [phone] with questions or concerns regarding your invoice.   Our billing staff will not be able to assist you with questions regarding bills from these companies.  You will be contacted with the lab results as soon as they are available. The fastest way to get your results is to activate your My Chart account. Instructions are located on the last page of this paperwork. If you have not heard from us regarding the results in 2 weeks, please contact this office.     

## 2017-05-21 ENCOUNTER — Telehealth: Payer: Self-pay | Admitting: Family Medicine

## 2017-05-21 NOTE — Telephone Encounter (Signed)
Dr. Tamala Julian    Pharmacy states her insurance will cover generic for Dilaudid/Morphine/Norco Not prescription sent in

## 2017-05-21 NOTE — Telephone Encounter (Signed)
Patients insurance will not cover her  oxyCODONE (OXY IR/ROXICODONE) 5 MG immediate release tablet she would like to get something that her insurance would cover.   Can someone give her a call about this her number is  [phone]

## 2017-05-21 NOTE — Telephone Encounter (Signed)
Pt calling to let us know that her insurance want cover Oxycodone any more and that they will pay for only 4 pain medicines she forgot the names but one of them os morphine pt states that she would like something for her pain that insurance will pay forplease call pt

## 2017-05-21 NOTE — Telephone Encounter (Signed)
Patient is going to call with a list of medication that are covered

## 2017-05-23 ENCOUNTER — Telehealth: Payer: Self-pay | Admitting: Family Medicine

## 2017-05-23 ENCOUNTER — Ambulatory Visit
Admission: RE | Admit: 2017-05-23 | Discharge: 2017-05-23 | Disposition: A | Discharge status: Home / Self Care | Payer: Medicare Other | Source: Ambulatory Visit | Attending: Orthopaedic Surgery | Admitting: Orthopaedic Surgery

## 2017-05-23 DIAGNOSIS — M48061 Spinal stenosis, lumbar region without neurogenic claudication: Secondary | ICD-10-CM | POA: Diagnosis not present

## 2017-05-23 DIAGNOSIS — M5416 Radiculopathy, lumbar region: Secondary | ICD-10-CM

## 2017-05-23 NOTE — Telephone Encounter (Signed)
Pt is calling to check on status of if a new medication has been called in because insurance does not cover the oxycodone any longer-pt spoke with Almyra Free over the weekend and gave her names of meds that would be covered under insurance and then forward the message to Dr. Tamala Julian but Tamala Julian is on vacation and the patient is needing to know what to do next   Best number [phone]

## 2017-05-23 NOTE — Telephone Encounter (Signed)
Pt calling again about pain medicine

## 2017-05-23 NOTE — Telephone Encounter (Signed)
Dr. Tamala Julian, This patient is in a lot of pain and needs alternative pain medication. See previous message (alternatives)

## 2017-05-24 MED ORDER — HYDROCODONE-ACETAMINOPHEN 10-325 MG PO TABS: 1.0000 | ORAL_TABLET | Freq: Two times a day (BID) | ORAL | 0 refills | Status: DC

## 2017-05-24 NOTE — Telephone Encounter (Signed)
Please change patient to hydrocodone 10/325 one twice daily #60. PA Jacqulynn Cadet, can you provide this rx for patient in my abscence?  Pt will need to bring in the oxycodone rx I provided to her last week.

## 2017-05-24 NOTE — Telephone Encounter (Signed)
Rx printed at 104. Will bring to 102 after clinic.  Meds ordered this encounter  Medications  . HYDROcodone-acetaminophen (NORCO) 10-325 MG tablet    Sig: Take 1 tablet by mouth 2 (two) times daily.    Dispense:  60 tablet    Refill:  0    Order Specific Question:   Supervising Provider    Answer:   Brigitte Pulse, EVA N [[id number]]

## 2017-05-27 NOTE — Telephone Encounter (Signed)
Pt picked script up

## 2017-06-09 ENCOUNTER — Other Ambulatory Visit: Payer: Self-pay | Admitting: Family Medicine

## 2017-06-09 DIAGNOSIS — M5441 Lumbago with sciatica, right side: Secondary | ICD-10-CM

## 2017-06-09 IMAGING — CT CT ABD-PELV W/ CM
2 of 5 series · 15 of 46 positions shown, 17 images · IV contrast (OMNIPAQUE 300)
Comparison: MRI of the lumbar spine July 15, 2015

CLINICAL DATA: RIGHT lower quadrant pain for several months,
previously intermittent, now constant for a few weeks. Dyspareunia.
History of hepatitis-C, hypertension.

EXAM:
CT ABDOMEN AND PELVIS WITH CONTRAST
TECHNIQUE: Multidetector CT imaging of the abdomen and pelvis was performed
using the standard protocol following bolus administration of
intravenous contrast.
CONTRAST:  100mL OMNIPAQUE IOHEXOL 300 MG/ML  SOLN

[Series 2: abd/pel with · axial · 0.68mm/px · z∈[-466,-80]mm · 12 of 87 slices shown, 14 images]
[im 5/87  soft-tissue]
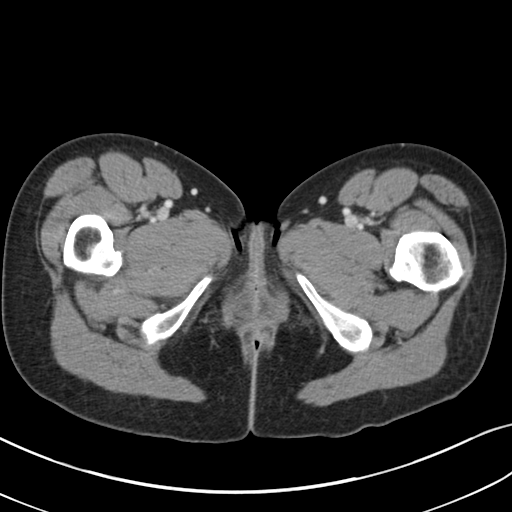
[im 5/87  bone]
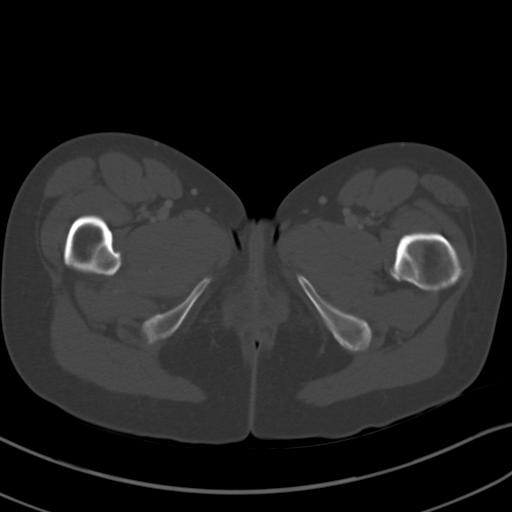
[im 15/87  soft-tissue]
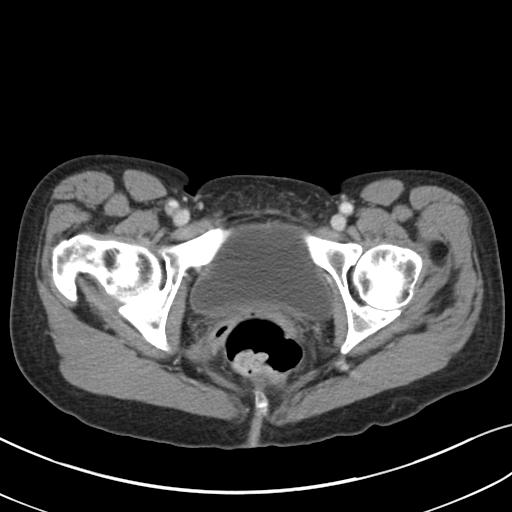
[im 20/87  soft-tissue]
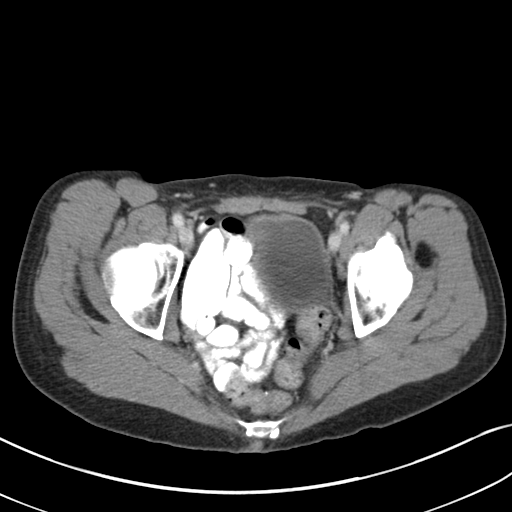
[im 24/87  soft-tissue]
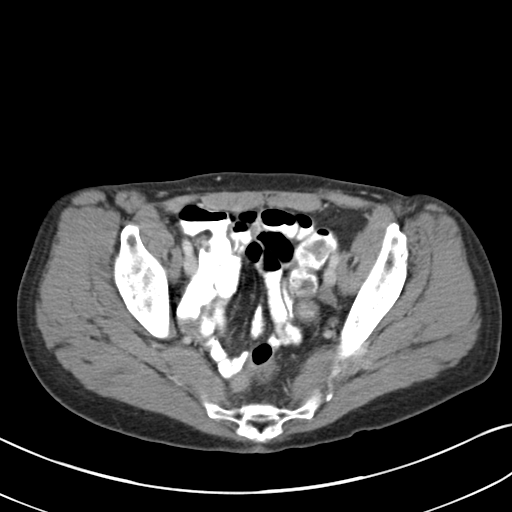
[im 34/87  soft-tissue]
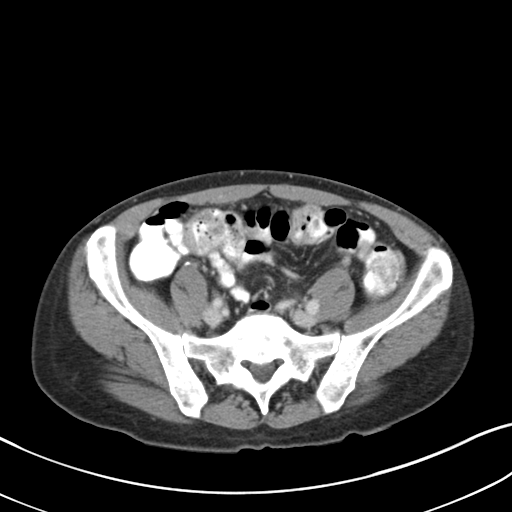
[im 39/87  soft-tissue]
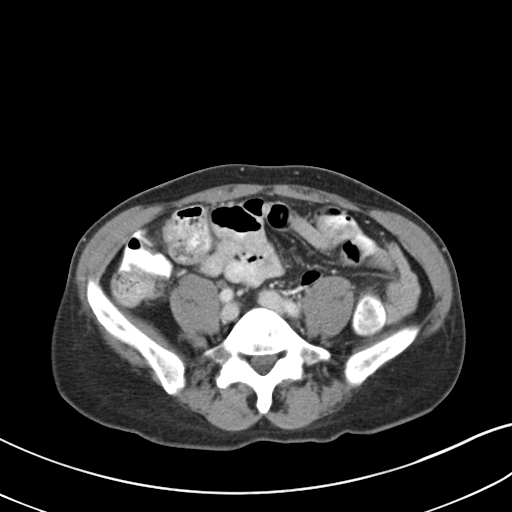
[im 48/87  soft-tissue]
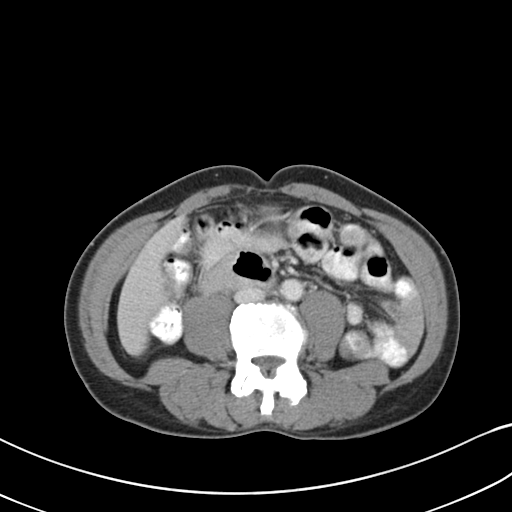
[im 53/87  soft-tissue]
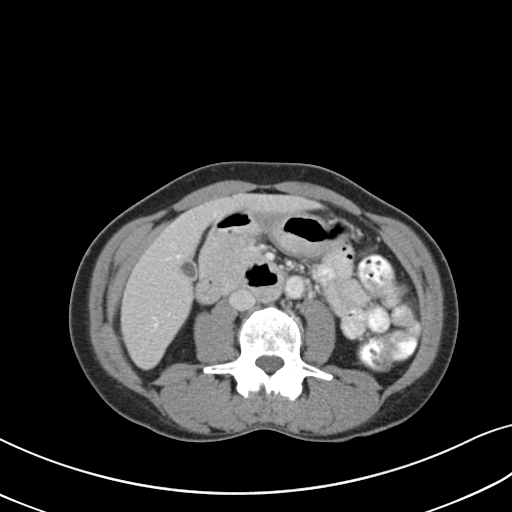
[im 63/87  soft-tissue]
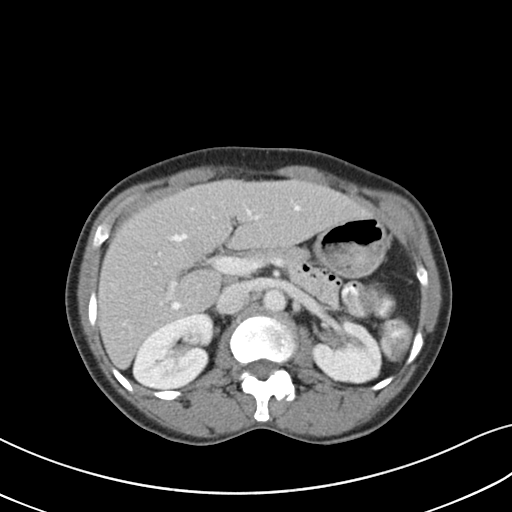
[im 63/87  bone]
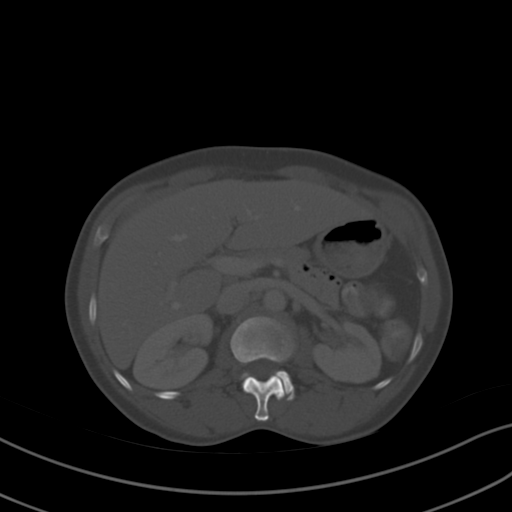
[im 67/87  soft-tissue]
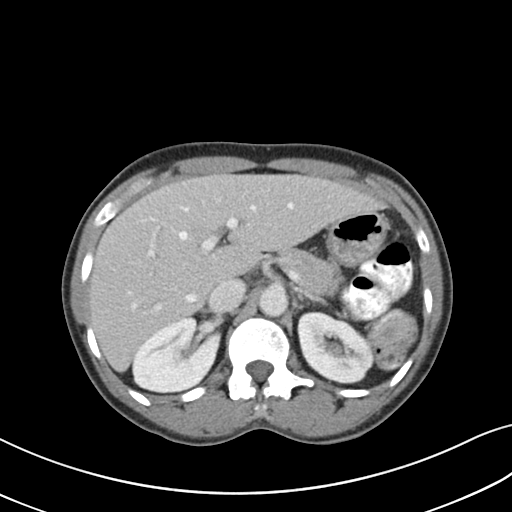
[im 72/87  soft-tissue]
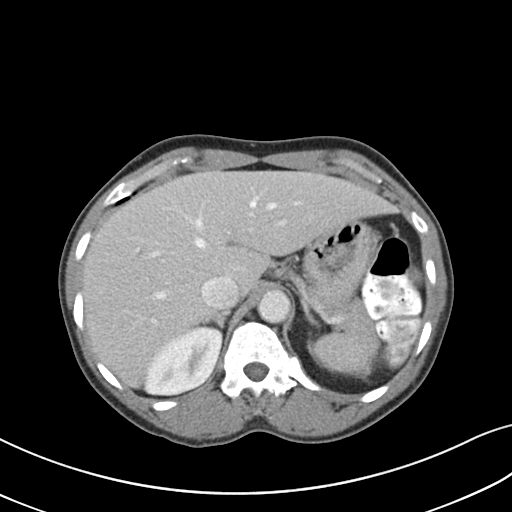
[im 82/87  soft-tissue]
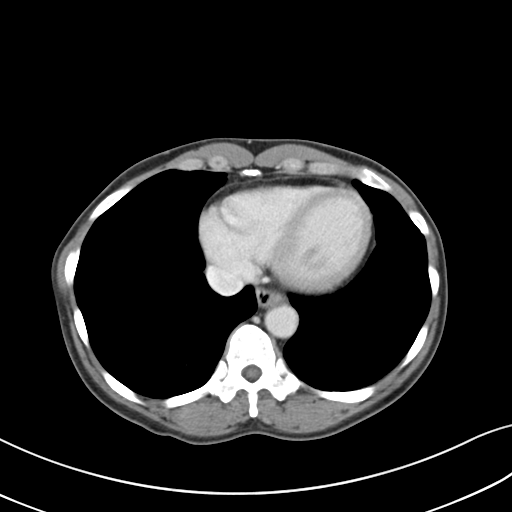

[Series 3: coronal a/|p · coronal · 0.60mm/px · 3 of 73 slices shown]
[im 25/73  soft-tissue]
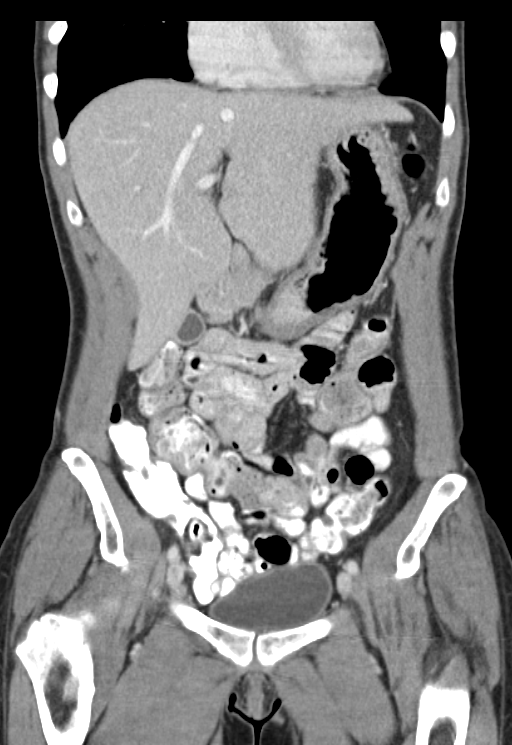
[im 33/73  soft-tissue]
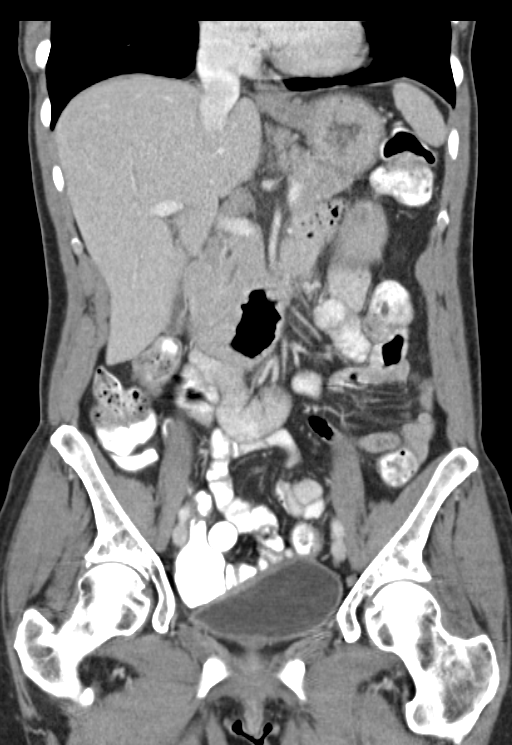
[im 41/73  soft-tissue]
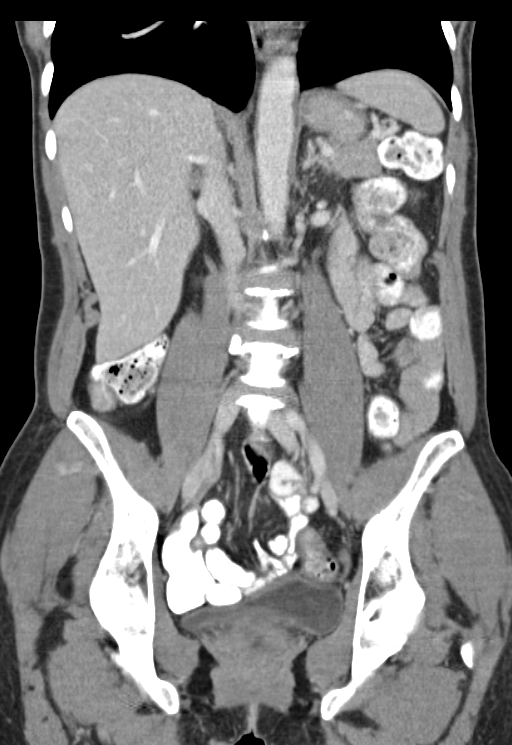

[15 of 46 positions shown; findings below may reference images not displayed]

FINDINGS: LUNG BASES: Included view of the lung bases are clear, mild
emphysematous changes. Visualized heart and pericardium are
unremarkable.

SOLID ORGANS: The liver, spleen, gallbladder, pancreas and adrenal
glands are unremarkable.

GASTROINTESTINAL TRACT: Mildly thickened gastric antrum. The small
and large bowel are normal in course and caliber without
inflammatory changes. Moderate duodenal diverticulum. Normal
appendix.

KIDNEYS/ URINARY TRACT: Kidneys are orthotopic, demonstrating
symmetric enhancement. Focal cortical scarring LEFT interpolar
kidney with mild LEFT kidney atrophy. Compensatory RIGHT
nephromegaly. No nephrolithiasis, hydronephrosis or solid renal
masses. The unopacified ureters are normal in course and caliber.
Delayed imaging through the kidneys demonstrates symmetric prompt
contrast excretion within the proximal urinary collecting system.
Urinary bladder is partially distended and unremarkable.

PERITONEUM/RETROPERITONEUM: Aortoiliac vessels are normal in course
and caliber. No lymphadenopathy by CT size criteria. Status post
hysterectomy. No intraperitoneal free fluid nor free air.

SOFT TISSUE/OSSEOUS STRUCTURES: Subcentimeter focal sclerosis RIGHT
femoral head with surrounding thin rim of lucency. LEFT gluteus
minimus small lipoma. Straightened lumbar lordosis. Severe mid
lumbar facet arthropathy without destructive bony lesions.
IMPRESSION: Mildly thickened gastric antrum could represent underdistention or,
gastritis. Moderate duodenum diverticulum. Normal appendix.

Mildly atrophic LEFT kidney with compensatory RIGHT nephromegaly.

RIGHT femoral head avascular necrosis without collapse.

## 2017-06-24 ENCOUNTER — Other Ambulatory Visit: Payer: Self-pay | Admitting: Family Medicine

## 2017-06-24 ENCOUNTER — Ambulatory Visit: Payer: Medicare Other | Admitting: Family Medicine

## 2017-06-24 DIAGNOSIS — M5441 Lumbago with sciatica, right side: Secondary | ICD-10-CM

## 2017-06-27 ENCOUNTER — Other Ambulatory Visit: Payer: Self-pay | Admitting: Family Medicine

## 2017-06-27 DIAGNOSIS — M5441 Lumbago with sciatica, right side: Secondary | ICD-10-CM

## 2017-07-01 ENCOUNTER — Ambulatory Visit: Payer: Medicare Other | Admitting: Family Medicine

## 2017-07-08 ENCOUNTER — Telehealth: Payer: Self-pay | Admitting: Family Medicine

## 2017-07-08 NOTE — Telephone Encounter (Signed)
Pt is requesting a quanity of 90 for her pain medication   Best number [phone]  tis call was actually made by [employer] for the patient

## 2017-07-12 ENCOUNTER — Telehealth: Payer: Self-pay | Admitting: Family Medicine

## 2017-07-12 NOTE — Telephone Encounter (Signed)
PT CALLING STATING THAT HER INSURANCE TOLD HER THAT SHE CAN GET 60 PILLS OF OXYCODONE WITH A PRIOR AUTHORIZATION SHE HAS AN APPOINTMENT ON 07-22-17 SHE'S BEEN OUT OF MEDICINE FOR 2 WEEKS NOW SHE LIKE TO USE [facility] ON [address]

## 2017-07-12 NOTE — Telephone Encounter (Signed)
Spoke with patient, states that pharmacy would only fill 5 day rx for pain medications, but that she spoke with her new insurance and was told that they will authorize up to 90 tabs. Patient advised to come in for appointment to discuss with provider, as last rx was written 05/2017. Patient states that she will call back to schedule./ S.Arihana Ambrocio,CMA

## 2017-07-20 ENCOUNTER — Ambulatory Visit: Payer: Medicare Other | Admitting: Family Medicine

## 2017-07-22 ENCOUNTER — Ambulatory Visit: Payer: Medicare Other | Admitting: Family Medicine

## 2017-07-25 ENCOUNTER — Telehealth: Payer: Self-pay

## 2017-07-25 NOTE — Telephone Encounter (Signed)
There was a VM from patient on my line this morning, requesting a PA on her oxycodone.  After reviewing chart, I see a telephone encounter advising patient to RTC to be seen.  Patient has appt with Dr. Tamala Julian on 07/29/17 at 9:00am in chart, however, we do not have documentation of patient's new insurance information.  I called patient to advise her, however, there was no answer and no VM to leave a message.

## 2017-07-28 ENCOUNTER — Encounter (HOSPITAL_COMMUNITY): Payer: Self-pay | Admitting: Psychiatry

## 2017-07-28 ENCOUNTER — Ambulatory Visit (INDEPENDENT_AMBULATORY_CARE_PROVIDER_SITE_OTHER): Payer: Medicare Other | Admitting: Psychiatry

## 2017-07-28 DIAGNOSIS — F17210 Nicotine dependence, cigarettes, uncomplicated: Secondary | ICD-10-CM

## 2017-07-28 DIAGNOSIS — F1921 Other psychoactive substance dependence, in remission: Secondary | ICD-10-CM

## 2017-07-28 DIAGNOSIS — Z62810 Personal history of physical and sexual abuse in childhood: Secondary | ICD-10-CM | POA: Diagnosis not present

## 2017-07-28 DIAGNOSIS — F250 Schizoaffective disorder, bipolar type: Secondary | ICD-10-CM | POA: Diagnosis not present

## 2017-07-28 DIAGNOSIS — F4310 Post-traumatic stress disorder, unspecified: Secondary | ICD-10-CM | POA: Diagnosis not present

## 2017-07-28 DIAGNOSIS — Z818 Family history of other mental and behavioral disorders: Secondary | ICD-10-CM

## 2017-07-28 MED ORDER — CITALOPRAM HYDROBROMIDE 10 MG PO TABS: 10.0000 mg | ORAL_TABLET | Freq: Every day | ORAL | 0 refills | Status: DC

## 2017-07-28 MED ORDER — QUETIAPINE FUMARATE 300 MG PO TABS: 300.0000 mg | ORAL_TABLET | Freq: Every day | ORAL | 0 refills | Status: DC
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
## 2017-07-29 ENCOUNTER — Encounter: Payer: Self-pay | Admitting: Family Medicine

## 2017-07-29 ENCOUNTER — Ambulatory Visit (INDEPENDENT_AMBULATORY_CARE_PROVIDER_SITE_OTHER): Payer: 59

## 2017-07-29 ENCOUNTER — Ambulatory Visit (INDEPENDENT_AMBULATORY_CARE_PROVIDER_SITE_OTHER): Payer: 59 | Admitting: Family Medicine

## 2017-07-29 VITALS — BP 140/80 | HR 84 | Temp 98.2°F | Resp 18 | Ht 63.0 in | Wt 122.0 lb

## 2017-07-29 DIAGNOSIS — B182 Chronic viral hepatitis C: Secondary | ICD-10-CM | POA: Diagnosis not present

## 2017-07-29 DIAGNOSIS — G894 Chronic pain syndrome: Secondary | ICD-10-CM

## 2017-07-29 DIAGNOSIS — J441 Chronic obstructive pulmonary disease with (acute) exacerbation: Secondary | ICD-10-CM | POA: Diagnosis not present

## 2017-07-29 DIAGNOSIS — I10 Essential (primary) hypertension: Secondary | ICD-10-CM

## 2017-07-29 DIAGNOSIS — G40009 Localization-related (focal) (partial) idiopathic epilepsy and epileptic syndromes with seizures of localized onset, not intractable, without status epilepticus: Secondary | ICD-10-CM | POA: Diagnosis not present

## 2017-07-29 DIAGNOSIS — M542 Cervicalgia: Secondary | ICD-10-CM

## 2017-07-29 DIAGNOSIS — M5126 Other intervertebral disc displacement, lumbar region: Secondary | ICD-10-CM

## 2017-07-29 DIAGNOSIS — R1013 Epigastric pain: Secondary | ICD-10-CM | POA: Diagnosis not present

## 2017-07-29 DIAGNOSIS — B001 Herpesviral vesicular dermatitis: Secondary | ICD-10-CM

## 2017-07-29 MED ORDER — ALBUTEROL SULFATE HFA 108 (90 BASE) MCG/ACT IN AERS: INHALATION_SPRAY | RESPIRATORY_TRACT | 2 refills | Status: DC

## 2017-07-29 MED ORDER — PANTOPRAZOLE SODIUM 40 MG PO TBEC: 40.0000 mg | DELAYED_RELEASE_TABLET | Freq: Every day | ORAL | 3 refills | Status: DC

## 2017-07-29 MED ORDER — VALACYCLOVIR HCL 1 G PO TABS: ORAL_TABLET | ORAL | 3 refills | Status: DC

## 2017-07-29 MED ORDER — AMLODIPINE BESYLATE 5 MG PO TABS: 5.0000 mg | ORAL_TABLET | Freq: Every day | ORAL | 1 refills | Status: DC

## 2017-07-29 MED ORDER — CELECOXIB 200 MG PO CAPS: 200.0000 mg | ORAL_CAPSULE | Freq: Every day | ORAL | 1 refills | Status: DC

## 2017-07-29 MED ORDER — OXYCODONE HCL 5 MG PO TABS: 5.0000 mg | ORAL_TABLET | Freq: Two times a day (BID) | ORAL | 0 refills | Status: DC | PRN

## 2017-07-29 MED ORDER — FLUTICASONE-SALMETEROL 115-21 MCG/ACT IN AERO: 2.0000 | INHALATION_SPRAY | Freq: Two times a day (BID) | RESPIRATORY_TRACT | 12 refills | Status: DC

## 2017-07-29 NOTE — Patient Instructions (Addendum)
  START PANTOPRAZOLE 40MG  ONE TABLET ONE HOUR BEFORE A MEAL FOR STOMACH PAIN. STOP TAKING ALEVE; IT IS UPSETTING YOUR STOMACH; IT IS CAUSING STOMACH PAIN. TAKE OXYCODONE FOR PAIN. TAKE GABAPENTIN ONE EVERY MORNING AND TWO AT NIGHTTIME. TAKE CELEBREX FOR ARTHRITIS PAIN.   IF you received an x-ray today, you will receive an invoice from [facility]. Please contact [facility] at [phone] with questions or concerns regarding your invoice.   IF you received labwork today, you will receive an invoice from [facility]. Please contact [facility] at [phone] with questions or concerns regarding your invoice.   Our billing staff will not be able to assist you with questions regarding bills from these companies.  You will be contacted with the lab results as soon as they are available. The fastest way to get your results is to activate your My Chart account. Instructions are located on the last page of this paperwork. If you have not heard from [place] regarding the results in 2 weeks, please contact this office.
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
## 2017-07-30 LAB — CBC WITH DIFFERENTIAL/PLATELET
Basophils Absolute: 0 10*3/uL (ref 0.0–0.2)
Basos: 0 %
EOS (ABSOLUTE): 0.1 10*3/uL (ref 0.0–0.4)
EOS: 1 %
HEMATOCRIT: 41.6 % (ref 34.0–46.6)
HEMOGLOBIN: 13.4 g/dL (ref 11.1–15.9)
Immature Grans (Abs): 0 10*3/uL (ref 0.0–0.1)
Immature Granulocytes: 0 %
Lymphocytes Absolute: 1.6 10*3/uL (ref 0.7–3.1)
Lymphs: 22 %
MCH: 30.4 pg (ref 26.6–33.0)
MCHC: 32.2 g/dL (ref 31.5–35.7)
MCV: 94 fL (ref 79–97)
MONOCYTES: 7 %
Monocytes Absolute: 0.5 10*3/uL (ref 0.1–0.9)
NEUTROS ABS: 4.9 10*3/uL (ref 1.4–7.0)
Neutrophils: 70 %
Platelets: 261 10*3/uL (ref 150–379)
RBC: 4.41 x10E6/uL (ref 3.77–5.28)
RDW: 19.6 % — AB (ref 12.3–15.4)
WBC: 7 10*3/uL (ref 3.4–10.8)

## 2017-07-30 LAB — COMPREHENSIVE METABOLIC PANEL
ALT: 64 IU/L — ABNORMAL HIGH (ref 0–32)
AST: 128 IU/L — AB (ref 0–40)
Albumin/Globulin Ratio: 1.4 (ref 1.2–2.2)
Albumin: 4.6 g/dL (ref 3.5–5.5)
Alkaline Phosphatase: 108 IU/L (ref 39–117)
BUN/Creatinine Ratio: 7 — ABNORMAL LOW (ref 9–23)
BUN: 6 mg/dL (ref 6–24)
Bilirubin Total: 0.6 mg/dL (ref 0.0–1.2)
CALCIUM: 10 mg/dL (ref 8.7–10.2)
CO2: 20 mmol/L (ref 20–29)
CREATININE: 0.83 mg/dL (ref 0.57–1.00)
Chloride: 102 mmol/L (ref 96–106)
GFR calc Af Amer: 90 mL/min/{1.73_m2} (ref >59)
GFR, EST NON AFRICAN AMERICAN: 78 mL/min/{1.73_m2} (ref >59)
GLOBULIN, TOTAL: 3.2 g/dL (ref 1.5–4.5)
GLUCOSE: 106 mg/dL — AB (ref 65–99)
Potassium: 3.5 mmol/L (ref 3.5–5.2)
SODIUM: 139 mmol/L (ref 134–144)
Total Protein: 7.8 g/dL (ref 6.0–8.5)

## 2017-08-29 ENCOUNTER — Ambulatory Visit: Payer: 59 | Admitting: Family Medicine

## 2017-09-02 ENCOUNTER — Encounter: Payer: Self-pay | Admitting: Family Medicine

## 2017-09-02 ENCOUNTER — Ambulatory Visit (INDEPENDENT_AMBULATORY_CARE_PROVIDER_SITE_OTHER): Payer: Medicare Other | Admitting: Family Medicine

## 2017-09-02 VITALS — BP 122/78 | HR 86 | Temp 98.0°F | Resp 16 | Ht 63.39 in | Wt 117.0 lb

## 2017-09-02 DIAGNOSIS — J441 Chronic obstructive pulmonary disease with (acute) exacerbation: Secondary | ICD-10-CM

## 2017-09-02 DIAGNOSIS — M5126 Other intervertebral disc displacement, lumbar region: Secondary | ICD-10-CM

## 2017-09-02 DIAGNOSIS — M5442 Lumbago with sciatica, left side: Secondary | ICD-10-CM | POA: Diagnosis not present

## 2017-09-02 DIAGNOSIS — Z23 Encounter for immunization: Secondary | ICD-10-CM

## 2017-09-02 DIAGNOSIS — N952 Postmenopausal atrophic vaginitis: Secondary | ICD-10-CM

## 2017-09-02 DIAGNOSIS — I10 Essential (primary) hypertension: Secondary | ICD-10-CM | POA: Diagnosis not present

## 2017-09-02 DIAGNOSIS — F250 Schizoaffective disorder, bipolar type: Secondary | ICD-10-CM

## 2017-09-02 DIAGNOSIS — L309 Dermatitis, unspecified: Secondary | ICD-10-CM | POA: Diagnosis not present

## 2017-09-02 DIAGNOSIS — G8929 Other chronic pain: Secondary | ICD-10-CM | POA: Diagnosis not present

## 2017-09-02 DIAGNOSIS — M25512 Pain in left shoulder: Secondary | ICD-10-CM | POA: Diagnosis not present

## 2017-09-02 DIAGNOSIS — M7622 Iliac crest spur, left hip: Secondary | ICD-10-CM | POA: Diagnosis not present

## 2017-09-02 DIAGNOSIS — F17200 Nicotine dependence, unspecified, uncomplicated: Secondary | ICD-10-CM

## 2017-09-02 MED ORDER — ESTROGENS, CONJUGATED 0.625 MG/GM VA CREA: 1.0000 | TOPICAL_CREAM | Freq: Every day | VAGINAL | 12 refills | Status: DC

## 2017-09-02 MED ORDER — TRIAMCINOLONE ACETONIDE 0.1 % EX CREA: TOPICAL_CREAM | CUTANEOUS | 0 refills | Status: DC

## 2017-09-02 MED ORDER — OXYCODONE HCL 5 MG PO TABS: 5.0000 mg | ORAL_TABLET | Freq: Two times a day (BID) | ORAL | 0 refills | Status: DC | PRN

## 2017-09-02 MED ORDER — BUDESONIDE-FORMOTEROL FUMARATE 80-4.5 MCG/ACT IN AERO: 2.0000 | INHALATION_SPRAY | Freq: Two times a day (BID) | RESPIRATORY_TRACT | 4 refills | Status: DC

## 2017-09-02 MED ORDER — METHOCARBAMOL 500 MG PO TABS: 500.0000 mg | ORAL_TABLET | Freq: Three times a day (TID) | ORAL | 2 refills | Status: DC | PRN

## 2017-09-02 NOTE — Progress Notes (Signed)
Subjective:    Patient ID: Melissa Bridges, female    DOB: May 06, 1961, 56 y.o.   MRN: [id number]  09/02/2017  Back Pain (lower back x 1 week ) and Medication Refill (Albuterol,amlodipine,symbicort,premarin)   HPI This 56 y.o. 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  Patient signed a pain contract at our last visit yet left without providing the mandatory UDS.  Ongoing lower back pain.    Moved; has own place; BP was really high. New apartment quiet; most normal person out there; [place].  Weather has been causing worsening back pain.  L hip spur: saw orthopedist. Recommended quitting smoking.  Got some patches.  Ordered from insurance.    Last AWV in 09/2015.  Insurance contacted patient to schedule AWV.  Shoulder pain L: recommended seeing orthopedist for shoulder pain by NS.  Asthma: wheezes intermittently; continues to smoke yet realizes that needs to quit; needs refills.  HTN: Patient reports good compliance with medication, good tolerance to medication, and good symptom control.  Does not check BP at home.  BP Readings from Last 3 Encounters:  09/02/17 122/78  07/29/17 140/80  05/20/17 (!) 177/92   Wt Readings from Last 3 Encounters:  09/02/17 117 lb (53.1 kg)  07/29/17 122 lb (55.3 kg)  05/20/17 121 lb 12.8 oz (55.2 kg)   Immunization History  Administered Date(s) Administered  . Hepatitis A, Adult 05/11/2016, 04/06/2017  . Influenza,inj,Quad PF,6+ Mos 09/29/2015, 09/04/2016, 09/02/2017  . Pneumococcal Polysaccharide-23 11/12/2011  . Tdap 05/11/2016    Review of Systems  Constitutional: Negative for chills, diaphoresis, fatigue and fever.  Eyes: Negative for visual disturbance.  Respiratory: Positive for wheezing. Negative for cough and shortness of breath.   Cardiovascular: Negative for chest pain, palpitations and leg swelling.  Gastrointestinal: Negative for abdominal pain, constipation, diarrhea, nausea and vomiting.  Endocrine:  Negative for cold intolerance, heat intolerance, polydipsia, polyphagia and polyuria.  Musculoskeletal: Positive for arthralgias, back pain, myalgias, neck pain and neck stiffness.  Neurological: Negative for dizziness, tremors, seizures, syncope, facial asymmetry, speech difficulty, weakness, light-headedness, numbness and headaches.    Past Medical History:  Diagnosis Date  . Allergy   . Alopecia   . Anxiety   . Arthritis   . Asthma   . Cataract    "mild"  . COPD (chronic obstructive pulmonary disease) ([place])   . Depression   . GERD (gastroesophageal reflux disease)    o cc- takes OTC if needed.  . Glaucoma   . Hepatitis C   . Hypertension    onset age 63.  . Iron deficiency anemia   . Migraine   . Schizoaffective disorder   . Schizoaffective disorder ([place])   . Sciatic pain   . Seizures ([place])    onset in childhood. 06-03-16- per pt 8 months agoGeneralized tonic-clonic.Last seizure couple of months ago- last sz 9-10 months ago per pt 10-12-2016  . Shortness of breath dyspnea   . Substance abuse   . Ulcer    Past Surgical History:  Procedure Laterality Date  . ABDOMINAL HYSTERECTOMY     ovarian cyst B, cervical dysplasia, fibroids.   Ovaries intact.  . COLONOSCOPY    . COLONOSCOPY W/ BIOPSIES    . DILATION AND CURETTAGE OF UTERUS     x2  . EXPLORATORY LAPAROTOMY     x3  . GYNECOLOGIC CRYOSURGERY     x3  . MANDIBLE RECONSTRUCTION N/A 06/27/2015   Procedure: REMOVAL MAXILLARY PALATAL TORUS.  REMOVAL MANDIBULAR TORUS AND  EXOSTOSIS.  ;  Surgeon: Diona Browner, DDS;  Location: [facility];  Service: Oral Surgery;  Laterality: N/A;  . OVARIAN CYST REMOVAL  2008  . POLYPECTOMY     Allergies  Allergen Reactions  . Fruit & Vegetable Daily [Nutritional Supplements] Shortness Of Breath    Aloe  . Iodinated Diagnostic Agents Swelling    Swelling and itching of left side of her face only after CT SI injection(no steroid used)  . Ibuprofen Other (See Comments)    Stomach upset;  However, pt can take Meloxicam without incident and takes this at home.   . Aspirin Rash and Other (See Comments)    Stomach upset    Social History   Social History  . Marital status: Widowed    Spouse name: N/A  . Number of children: 1  . Years of education: N/A   Occupational History  . disabled     mental illness; seizures   Social History Main Topics  . Smoking status: Current Every Day Smoker    Packs/day: 0.33    Years: 44.00    Types: Cigarettes  . Smokeless tobacco: Never Used  . Alcohol use 0.0 oz/week     Comment: occasionally  . Drug use: No     Comment: no longer using  . Sexual activity: Not Currently    Birth control/ protection: None     Comment: widow   Other Topics Concern  . Not on file   Social History Narrative   Marital status:  Widowed since 2002.  Married x 16 years. + dating.  Moved from [place] to live with daughter in 2013.     Children:  One child/daughter (33); two grandchildren.      Lives: with daughter, grandchildren 2.  Does not drive due to epilepsy.      Employment:  Disability for schizoaffective disorder.      Tobacco: 1 ppd x since 8th grade.      Alcohol:  Social; rare drinking due to seizure medications.  Weekends.       Drugs: none; previous use of marijuana.  Previous iv drug use, cocaine.      Exercise: none      Seatbelt:  100%      Guns: none   Family History  Problem Relation Age of Onset  . Diabetes type II Mother   . Hypertension Mother   . Arthritis Mother   . Diabetes Mother   . Mental illness Mother        bipolar  . Diabetes type II Maternal Aunt   . Hypertension Father   . Heart murmur Father   . Arthritis Father   . Stroke Father   . Alopecia Sister   . Alopecia Brother   . Alopecia Sister   . Mental retardation Sister        depression  . Prostate cancer Paternal Grandfather   . Cancer Maternal Uncle   . Colon cancer Neg Hx   . Esophageal cancer Neg Hx   . Rectal cancer Neg Hx   . Stomach  cancer Neg Hx   . Colon polyps Neg Hx        Objective:    BP 122/78   Pulse 86   Temp 98 F (36.7 C) (Oral)   Resp 16   Ht 5' 3.39" (1.61 m)   Wt 117 lb (53.1 kg)   SpO2 97%   BMI 20.47 kg/m  Physical Exam  Constitutional: She is oriented to person, place,  and time. She appears well-developed and well-nourished. No distress.  HENT:  Head: Normocephalic and atraumatic.  Right Ear: External ear normal.  Left Ear: External ear normal.  Nose: Nose normal.  Mouth/Throat: Oropharynx is clear and moist.  Eyes: Pupils are equal, round, and reactive to light. Conjunctivae and EOM are normal.  Neck: Normal range of motion. Neck supple. Carotid bruit is not present. No thyromegaly present.  Cardiovascular: Normal rate, regular rhythm, normal heart sounds and intact distal pulses.  Exam reveals no gallop and no friction rub.   No murmur heard. Pulmonary/Chest: Effort normal and breath sounds normal. She has no wheezes. She has no rales.  Abdominal: Soft. Bowel sounds are normal. She exhibits no distension and no mass. There is no tenderness. There is no rebound and no guarding.  Musculoskeletal:       Lumbar back: She exhibits decreased range of motion and pain. She exhibits no tenderness, no bony tenderness and no spasm.  Lymphadenopathy:    She has no cervical adenopathy.  Neurological: She is alert and oriented to person, place, and time. No cranial nerve deficit.  Skin: Skin is warm and dry. No rash noted. She is not diaphoretic. No erythema. No pallor.  Hyperpigmented hypertrophied rash B extensor surfaces of elbows.  Psychiatric: She has a normal mood and affect. Her behavior is normal.    No results found. Depression screen [facility] 2/9 09/02/2017 07/29/2017 05/20/2017 04/06/2017 02/17/2017  Decreased Interest 1 1 0 2 0  Down, Depressed, Hopeless 1 1 0 1 0  PHQ - 2 Score 2 2 0 3 0  Altered sleeping 0 0 - 1 -  Tired, decreased energy 1 3 - 3 -  Change in appetite 0 0 - 0 -  Feeling  bad or failure about yourself  - 2 - 1 -  Trouble concentrating 0 1 - 1 -  Moving slowly or fidgety/restless 0 1 - 2 -  Suicidal thoughts 0 0 - 0 -  PHQ-9 Score 3 9 - 11 -  Difficult doing work/chores - Somewhat difficult - Somewhat difficult -  Some recent data might be hidden   Fall Risk  09/02/2017 09/02/2017 07/29/2017 05/20/2017 04/06/2017  Falls in the past year? Yes Yes Yes Yes Yes  Number falls in past yr: 2 or more 2 or more 2 or more - 1  Comment - - - - -  Injury with Fall? - - No - Yes  Follow up - - Falls evaluation completed - -        Assessment & Plan:   1. COPD exacerbation ([place])   2. Need for prophylactic vaccination and inoculation against influenza   3. Chronic left-sided low back pain with left-sided sciatica   4. Essential hypertension   5. Lumbar disc herniation   6. Schizoaffective disorder, bipolar type ([place])   7. Smoker   8. Dermatitis   9. Atrophic vaginitis    -chronic pain syndrome due to DDD lumbar spine; did not tolerate previous injection; patient unable to provide UDS during visit so Oxycodone rx discarded.  History of polysubstance abuse thus concerning for ongoing suage of illicit substances. -stable asthma; refill of Symbicort provided. -smoking cessation encouraged; spent 5 minutes with counseling. -improving dermatitis extensor surfaces of B elbows; refill of Triamcinolone provided. -refill of Robaxin provided as well per patient request.   Orders Placed This Encounter  Procedures  . Flu Vaccine QUAD 36+ mos IM  . Pain Management Screening Profile (10S)   Meds ordered  this encounter  Medications  . budesonide-formoterol (SYMBICORT) 80-4.5 MCG/ACT inhaler    Sig: Inhale 2 puffs into the lungs 2 (two) times daily.    Dispense:  3 Inhaler    Refill:  4  . conjugated estrogens (PREMARIN) vaginal cream    Sig: Place 1 Applicatorful vaginally daily.    Dispense:  30 g    Refill:  12  . triamcinolone cream (KENALOG) 0.1 %    Sig: APPLY   CREAM EXTERNALLY TWICE DAILY    Dispense:  30 g    Refill:  0    Please consider 90 day supplies to promote better adherence  . oxyCODONE (OXY IR/ROXICODONE) 5 MG immediate release tablet    Sig: Take 1 tablet (5 mg total) by mouth 2 (two) times daily as needed for severe pain.    Dispense:  60 tablet    Refill:  0  . methocarbamol (ROBAXIN) 500 MG tablet    Sig: Take 1-2 tablets (500-1,000 mg total) by mouth every 8 (eight) hours as needed for muscle spasms.    Dispense:  60 tablet    Refill:  2    Return in about 3 months (around 12/02/2017) for complete physical examiniation.   Kristi Elayne Guerin, M.D. Primary Care at [facility] [address] [place], [place]  [address] [phone] phone [phone] fax

## 2017-09-02 NOTE — Patient Instructions (Signed)
     IF you received an x-ray today, you will receive an invoice from [facility]. Please contact [facility] at [phone] with questions or concerns regarding your invoice.   IF you received labwork today, you will receive an invoice from [facility]. Please contact [facility] at [phone] with questions or concerns regarding your invoice.   Our billing staff will not be able to assist you with questions regarding bills from these companies.  You will be contacted with the lab results as soon as they are available. The fastest way to get your results is to activate your My Chart account. Instructions are located on the last page of this paperwork. If you have not heard from us regarding the results in 2 weeks, please contact this office.     

## 2017-09-27 ENCOUNTER — Encounter (HOSPITAL_COMMUNITY): Payer: Self-pay | Admitting: Emergency Medicine

## 2017-09-27 ENCOUNTER — Emergency Department (HOSPITAL_COMMUNITY)
Admission: EM | Admit: 2017-09-27 | Discharge: 2017-09-28 | Disposition: A | Discharge status: Home / Self Care | Payer: 59 | Attending: Emergency Medicine | Admitting: Emergency Medicine

## 2017-09-27 DIAGNOSIS — N760 Acute vaginitis: Secondary | ICD-10-CM | POA: Diagnosis not present

## 2017-09-27 DIAGNOSIS — Z711 Person with feared health complaint in whom no diagnosis is made: Secondary | ICD-10-CM

## 2017-09-27 DIAGNOSIS — Z202 Contact with and (suspected) exposure to infections with a predominantly sexual mode of transmission: Secondary | ICD-10-CM | POA: Diagnosis not present

## 2017-09-27 DIAGNOSIS — M545 Low back pain: Secondary | ICD-10-CM | POA: Diagnosis not present

## 2017-09-27 DIAGNOSIS — R1012 Left upper quadrant pain: Secondary | ICD-10-CM | POA: Insufficient documentation

## 2017-09-27 DIAGNOSIS — I10 Essential (primary) hypertension: Secondary | ICD-10-CM | POA: Diagnosis not present

## 2017-09-27 DIAGNOSIS — J45909 Unspecified asthma, uncomplicated: Secondary | ICD-10-CM | POA: Diagnosis not present

## 2017-09-27 DIAGNOSIS — Z79899 Other long term (current) drug therapy: Secondary | ICD-10-CM | POA: Diagnosis not present

## 2017-09-27 DIAGNOSIS — B9689 Other specified bacterial agents as the cause of diseases classified elsewhere: Secondary | ICD-10-CM | POA: Insufficient documentation

## 2017-09-27 DIAGNOSIS — F17210 Nicotine dependence, cigarettes, uncomplicated: Secondary | ICD-10-CM | POA: Diagnosis not present

## 2017-09-27 DIAGNOSIS — J449 Chronic obstructive pulmonary disease, unspecified: Secondary | ICD-10-CM | POA: Diagnosis not present

## 2017-09-27 LAB — URINALYSIS, ROUTINE W REFLEX MICROSCOPIC
BILIRUBIN URINE: NEGATIVE
Bacteria, UA: NONE SEEN
GLUCOSE, UA: NEGATIVE mg/dL
Ketones, ur: NEGATIVE mg/dL
LEUKOCYTES UA: NEGATIVE
NITRITE: NEGATIVE
PH: 6 (ref 5.0–8.0)
Protein, ur: NEGATIVE mg/dL
SPECIFIC GRAVITY, URINE: 1.017 (ref 1.005–1.030)

## 2017-09-27 MED ORDER — CEFTRIAXONE SODIUM 250 MG IJ SOLR
250.0000 mg | Freq: Once | INTRAMUSCULAR | Status: AC
  Administered 2017-09-27: 250 mg via INTRAMUSCULAR
  Filled 2017-09-27: qty 250

## 2017-09-27 MED ORDER — ONDANSETRON 4 MG PO TBDP
8.0000 mg | ORAL_TABLET | Freq: Once | ORAL | Status: AC
  Administered 2017-09-28: 8 mg via ORAL
  Filled 2017-09-27: qty 2

## 2017-09-27 MED ORDER — LIDOCAINE HCL (PF) 1 % IJ SOLN
5.0000 mL | Freq: Once | INTRAMUSCULAR | Status: AC
  Administered 2017-09-27: 5 mL
  Filled 2017-09-27: qty 5

## 2017-09-27 MED ORDER — AZITHROMYCIN 250 MG PO TABS
1000.0000 mg | ORAL_TABLET | Freq: Once | ORAL | Status: AC
  Administered 2017-09-27: 1000 mg via ORAL
  Filled 2017-09-27: qty 4

## 2017-09-27 MED ORDER — HYDROCODONE-ACETAMINOPHEN 5-325 MG PO TABS
2.0000 | ORAL_TABLET | Freq: Once | ORAL | Status: AC
  Administered 2017-09-27: 2 via ORAL
  Filled 2017-09-27: qty 2

## 2017-09-27 NOTE — ED Triage Notes (Signed)
Patient with back pain for the last week, increasing for the last three days, pain radiating to her pelvis on the left.

## 2017-09-27 NOTE — ED Notes (Signed)
Pt already in room.

## 2017-09-27 NOTE — ED Provider Notes (Signed)
[facility] EMERGENCY DEPARTMENT Provider Note   CSN: [id number] Arrival date & time: 09/27/17  2001     History   Chief Complaint Chief Complaint  Patient presents with  . Back Pain    HPI Melissa Bridges is a 56 y.o. female.  Patient presents to the ED with a chief complaint of low back pain that radiates to her left groin.  She states that the symptoms started 2-3 days ago.  She states that she has a history of a bone spur in her left hip and also has a history of sciatica, but states that she is concerned that she may have an STD.  She states that she does have some dysuria as well.  She endorses a new sexual partner.  She asked to be evaluated for this.  She denies any measured fever, chills, n/v/d, but states that she does get hot at night.  She denies any bowel or bladder incontinence.  Denies any weakness or numbness.  There are no other associated symptoms.   The history is provided by the patient. No language interpreter was used.    Past Medical History:  Diagnosis Date  . Allergy   . Alopecia   . Anxiety   . Arthritis   . Asthma   . Cataract    "mild"  . COPD (chronic obstructive pulmonary disease) ([place])   . Depression   . GERD (gastroesophageal reflux disease)    o cc- takes OTC if needed.  . Glaucoma   . Hepatitis C   . Hypertension    onset age 30.  . Iron deficiency anemia   . Migraine   . Schizoaffective disorder   . Schizoaffective disorder ([place])   . Sciatic pain   . Seizures ([place])    onset in childhood. 06-03-16- per pt 8 months agoGeneralized tonic-clonic.Last seizure couple of months ago- last sz 9-10 months ago per pt 10-12-2016  . Shortness of breath dyspnea   . Substance abuse ([place])   . Ulcer     Patient Active Problem List   Diagnosis Date Noted  . Serrated adenoma of colon 08/05/2016  . Localization-related idiopathic epilepsy and epileptic syndromes with seizures of localized onset, not intractable, without status  epilepticus ([place]) 10/16/2015  . 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 Alcohol abuse 09/06/2014  . Asthma with acute exacerbation 03/09/2013  . Hypertension 03/09/2013  . Lower back pain 03/09/2013  . Chronic hepatitis C without hepatic coma ([place]) 03/08/2013  . Smoker 12/12/2012  . Schizoaffective disorder ([place]) 12/12/2012  . Substance abuse ([place]) 11/14/2011    Past Surgical History:  Procedure Laterality Date  . ABDOMINAL HYSTERECTOMY     ovarian cyst B, cervical dysplasia, fibroids.   Ovaries intact.  . COLONOSCOPY    . COLONOSCOPY W/ BIOPSIES    . DILATION AND CURETTAGE OF UTERUS     x2  . EXPLORATORY LAPAROTOMY     x3  . GYNECOLOGIC CRYOSURGERY     x3  . MANDIBLE RECONSTRUCTION N/A 06/27/2015   Procedure: REMOVAL MAXILLARY PALATAL TORUS.  REMOVAL MANDIBULAR TORUS AND EXOSTOSIS.  ;  Surgeon: Diona Browner, DDS;  Location: [facility];  Service: Oral Surgery;  Laterality: N/A;  . OVARIAN CYST REMOVAL  2008  . POLYPECTOMY      OB History    No data available       Home Medications    Prior to Admission medications   Medication Sig Start Date End Date Taking? Authorizing Provider  albuterol (VENTOLIN  HFA) 108 (90 Base) MCG/ACT inhaler INHALE TWO PUFFS BY MOUTH EVERY 6 HOURS AS NEEDED FOR SHORTNESS OF BREATH 07/29/17   Wardell Honour, MD  amLODipine (NORVASC) 5 MG tablet Take 1 tablet (5 mg total) by mouth daily. 07/29/17   Wardell Honour, MD  budesonide-formoterol [facility]) 80-4.5 MCG/ACT inhaler Inhale 2 puffs into the lungs 2 (two) times daily. 09/02/17   Wardell Honour, MD  celecoxib (CELEBREX) 200 MG capsule Take 1 capsule (200 mg total) by mouth daily. 07/29/17   Wardell Honour, MD  citalopram (CELEXA) 10 MG tablet Take 1 tablet (10 mg total) by mouth daily. For depression 07/28/17   Arfeen, Arlyce Harman, MD  conjugated estrogens (PREMARIN) vaginal cream Place 1 Applicatorful vaginally daily. 09/02/17   Wardell Honour, MD  ferrous sulfate 325 (65 FE) MG tablet Take 1 tablet (325  mg total) by mouth daily with breakfast. 09/04/16   Wardell Honour, MD  fluticasone [facility]) 50 MCG/ACT nasal spray Place 2 sprays into both nostrils daily. 06/08/16   Ivar Drape D, PA  fluticasone-salmeterol (ADVAIR HFA) 035-00 MCG/ACT inhaler Inhale 2 puffs into the lungs 2 (two) times daily. 07/29/17   Wardell Honour, MD  ipratropium-albuterol (DUONEB) 0.5-2.5 (3) MG/3ML SOLN Take 3 mLs by nebulization every 4 (four) hours as needed (shortness of breath). Reported on 12/02/2015    [provider]  LamoTRIgine (LAMICTAL XR) 25 MG TB24 tablet take 1 tablet daily for 2 weeks, then increase to 2 tablets daily for 2 weeks, then increase to 4 tablets daily then see me in follow-up Patient taking differently: 100 mg at bedtime. take 1 tablet daily for 2 weeks, then increase to 2 tablets daily for 2 weeks, then increase to 4 tablets daily then see me in follow-up 12/02/15   Cameron Sprang, MD  methocarbamol (ROBAXIN) 500 MG tablet Take 1-2 tablets (500-1,000 mg total) by mouth every 8 (eight) hours as needed for muscle spasms. 09/02/17   Wardell Honour, MD  oxyCODONE (OXY IR/ROXICODONE) 5 MG immediate release tablet Take 1 tablet (5 mg total) by mouth 2 (two) times daily as needed for severe pain. 09/02/17   Wardell Honour, MD  pantoprazole (PROTONIX) 40 MG tablet Take 1 tablet (40 mg total) by mouth daily. 07/29/17   Wardell Honour, MD  QUEtiapine (SEROQUEL) 300 MG tablet Take 1 tablet (300 mg total) by mouth at bedtime. Mood control 07/28/17   Arfeen, Arlyce Harman, MD  triamcinolone cream (KENALOG) 0.1 % APPLY  CREAM EXTERNALLY TWICE DAILY 09/02/17   Wardell Honour, MD  valACYclovir (VALTREX) 1000 MG tablet Take 2 tabs po at symptom onset, repeat in 12 hours 07/29/17   Wardell Honour, MD    Family History Family History  Problem Relation Age of Onset  . Diabetes type II Mother   . Hypertension Mother   . Arthritis Mother   . Diabetes Mother   . Mental illness Mother        bipolar  .  Diabetes type II Maternal Aunt   . Hypertension Father   . Heart murmur Father   . Arthritis Father   . Stroke Father   . Alopecia Sister   . Alopecia Brother   . Alopecia Sister   . Mental retardation Sister        depression  . Prostate cancer Paternal Grandfather   . Cancer Maternal Uncle   . Colon cancer Neg Hx   . Esophageal cancer Neg Hx   .  Rectal cancer Neg Hx   . Stomach cancer Neg Hx   . Colon polyps Neg Hx     Social History Social History  Substance Use Topics  . Smoking status: Current Every Day Smoker    Packs/day: 0.33    Years: 44.00    Types: Cigarettes  . Smokeless tobacco: Never Used  . Alcohol use 0.0 oz/week     Comment: occasionally     Allergies   Fruit & vegetable daily [nutritional supplements]; Iodinated diagnostic agents; Ibuprofen; and Aspirin   Review of Systems Review of Systems  All other systems reviewed and are negative.    Physical Exam Updated Vital Signs BP (!) 125/98 (BP Location: Right Arm)   Pulse 78   Temp 98.1 F (36.7 C) (Oral)   Resp 18   Ht 5\' 3"  (1.6 m)   Wt 53.1 kg (117 lb)   SpO2 98%   BMI 20.73 kg/m   Physical Exam  Constitutional: She is oriented to person, place, and time. She appears well-developed and well-nourished.  HENT:  Head: Normocephalic and atraumatic.  Eyes: Pupils are equal, round, and reactive to light. Conjunctivae and EOM are normal.  Neck: Normal range of motion. Neck supple.  Cardiovascular: Normal rate and regular rhythm.  Exam reveals no gallop and no friction rub.   No murmur heard. Pulmonary/Chest: Effort normal and breath sounds normal. No respiratory distress. She has no wheezes. She has no rales. She exhibits no tenderness.  Abdominal: Soft. Bowel sounds are normal. She exhibits no distension and no mass. There is no tenderness. There is no rebound and no guarding.  Genitourinary:  Genitourinary Comments: Pelvic exam chaperoned by female ER tech, no right or left adnexal  tenderness, no uterine tenderness, no vaginal discharge, no bleeding, no CMT or friability, no foreign body, no injury to the external genitalia, no other significant findings   Musculoskeletal: Normal range of motion. She exhibits no edema or tenderness.  Neurological: She is alert and oriented to person, place, and time.  Skin: Skin is warm and dry.  Psychiatric: She has a normal mood and affect. Her behavior is normal. Judgment and thought content normal.  Nursing note and vitals reviewed.    ED Treatments / Results  Labs (all labs ordered are listed, but only abnormal results are displayed) Labs Reviewed  URINALYSIS, ROUTINE W REFLEX MICROSCOPIC - Abnormal; Notable for the following:       Result Value   APPearance HAZY (*)    Hgb urine dipstick SMALL (*)    Squamous Epithelial / LPF 0-5 (*)    All other components within normal limits  WET PREP, GENITAL  GC/CHLAMYDIA PROBE AMP ([place]) NOT AT [facility]    EKG  EKG Interpretation None       Radiology No results found.  Procedures Procedures (including critical care time)  Medications Ordered in ED Medications  cefTRIAXone (ROCEPHIN) injection 250 mg (250 mg Intramuscular Given 09/27/17 2358)  azithromycin (ZITHROMAX) tablet 1,000 mg (1,000 mg Oral Given 09/27/17 2356)  lidocaine (PF) (XYLOCAINE) 1 % injection 5 mL (5 mLs Other Given 09/27/17 2357)  HYDROcodone-acetaminophen (NORCO/VICODIN) 5-325 MG per tablet 2 tablet (2 tablets Oral Given 09/27/17 2356)  ondansetron (ZOFRAN-ODT) disintegrating tablet 8 mg (8 mg Oral Given 09/28/17 0001)     Initial Impression / Assessment and Plan / ED Course  I have reviewed the triage vital signs and the nursing notes.  Pertinent labs & imaging results that were available during my care of the  patient were reviewed by me and considered in my medical decision making (see chart for details).     Patient with left flank pain.  Appears uncomfortable.  Small Hgb in urine.   Reports some dysuria.  Concern for renal stone.  Patient is very concerned about STD.  Will check pelvic.  Will treat pain and reassess.  CT negative for stone or acute findings.  Clues on wet prep.  PCP follow-up.  Final Clinical Impressions(s) / ED Diagnoses   Final diagnoses:  Acute left-sided low back pain, with sciatica presence unspecified  BV (bacterial vaginosis)  Concern about STD in female without diagnosis    New Prescriptions Discharge Medication List as of 09/28/2017 12:48 AM    START taking these medications   Details  metroNIDAZOLE (FLAGYL) 500 MG tablet Take 1 tablet (500 mg total) by mouth 2 (two) times daily., Starting Wed 09/28/2017, Print         Montine Circle, PA-C 09/28/17 0215    Orpah Greek, MD 09/28/17 [phone]

## 2017-09-28 ENCOUNTER — Emergency Department (HOSPITAL_COMMUNITY): Payer: 59

## 2017-09-28 DIAGNOSIS — M545 Low back pain: Secondary | ICD-10-CM | POA: Diagnosis not present

## 2017-09-28 LAB — WET PREP, GENITAL
SPERM: NONE SEEN
Trich, Wet Prep: NONE SEEN
WBC WET PREP: NONE SEEN
YEAST WET PREP: NONE SEEN

## 2017-09-28 LAB — GC/CHLAMYDIA PROBE AMP (~~LOC~~) NOT AT ARMC
Chlamydia: NEGATIVE
NEISSERIA GONORRHEA: NEGATIVE

## 2017-09-28 MED ORDER — METRONIDAZOLE 500 MG PO TABS: 500.0000 mg | ORAL_TABLET | Freq: Two times a day (BID) | ORAL | 0 refills | Status: DC

## 2017-09-28 NOTE — ED Notes (Signed)
Patient transported to CT 

## 2017-09-28 NOTE — ED Notes (Signed)
Returned from CT.

## 2017-10-28 ENCOUNTER — Ambulatory Visit (HOSPITAL_COMMUNITY): Payer: 59 | Admitting: Psychiatry

## 2017-11-28 ENCOUNTER — Encounter: Payer: Medicare Other | Admitting: Family Medicine

## 2018-02-03 ENCOUNTER — Encounter (HOSPITAL_BASED_OUTPATIENT_CLINIC_OR_DEPARTMENT_OTHER): Payer: Self-pay | Admitting: Adult Health

## 2018-02-03 ENCOUNTER — Other Ambulatory Visit: Payer: Self-pay

## 2018-02-03 DIAGNOSIS — M5442 Lumbago with sciatica, left side: Secondary | ICD-10-CM | POA: Diagnosis not present

## 2018-02-03 DIAGNOSIS — F17210 Nicotine dependence, cigarettes, uncomplicated: Secondary | ICD-10-CM | POA: Diagnosis not present

## 2018-02-03 DIAGNOSIS — J45909 Unspecified asthma, uncomplicated: Secondary | ICD-10-CM | POA: Insufficient documentation

## 2018-02-03 DIAGNOSIS — I10 Essential (primary) hypertension: Secondary | ICD-10-CM | POA: Insufficient documentation

## 2018-02-03 DIAGNOSIS — J449 Chronic obstructive pulmonary disease, unspecified: Secondary | ICD-10-CM | POA: Insufficient documentation

## 2018-02-03 DIAGNOSIS — M545 Low back pain: Secondary | ICD-10-CM | POA: Diagnosis present

## 2018-02-03 DIAGNOSIS — G8929 Other chronic pain: Secondary | ICD-10-CM | POA: Diagnosis not present

## 2018-02-03 DIAGNOSIS — Z79899 Other long term (current) drug therapy: Secondary | ICD-10-CM | POA: Insufficient documentation

## 2018-02-03 NOTE — ED Triage Notes (Signed)
PREsents with left sided pain that has been off and on for a few months, she reports this feels like her sciatica. denies loss of bowel and bladder

## 2018-02-04 ENCOUNTER — Emergency Department (HOSPITAL_BASED_OUTPATIENT_CLINIC_OR_DEPARTMENT_OTHER)
Admission: EM | Admit: 2018-02-04 | Discharge: 2018-02-04 | Disposition: A | Discharge status: Home / Self Care | Payer: 59 | Attending: Emergency Medicine | Admitting: Emergency Medicine

## 2018-02-04 ENCOUNTER — Encounter (HOSPITAL_BASED_OUTPATIENT_CLINIC_OR_DEPARTMENT_OTHER): Payer: Self-pay | Admitting: Emergency Medicine

## 2018-02-04 DIAGNOSIS — M5442 Lumbago with sciatica, left side: Secondary | ICD-10-CM

## 2018-02-04 DIAGNOSIS — G8929 Other chronic pain: Secondary | ICD-10-CM

## 2018-02-04 LAB — URINALYSIS, MICROSCOPIC (REFLEX)

## 2018-02-04 LAB — URINALYSIS, ROUTINE W REFLEX MICROSCOPIC
Bilirubin Urine: NEGATIVE
GLUCOSE, UA: NEGATIVE mg/dL
Ketones, ur: NEGATIVE mg/dL
LEUKOCYTES UA: NEGATIVE
Nitrite: NEGATIVE
PROTEIN: NEGATIVE mg/dL
Specific Gravity, Urine: <1.005 — ABNORMAL LOW (ref 1.005–1.030)
pH: 5.5 (ref 5.0–8.0)

## 2018-02-04 LAB — RAPID URINE DRUG SCREEN, HOSP PERFORMED
AMPHETAMINES: NOT DETECTED
BENZODIAZEPINES: NOT DETECTED
Barbiturates: NOT DETECTED
Cocaine: POSITIVE — AB
OPIATES: NOT DETECTED
Tetrahydrocannabinol: POSITIVE — AB

## 2018-02-04 MED ORDER — METHOCARBAMOL 500 MG PO TABS: 500.0000 mg | ORAL_TABLET | Freq: Three times a day (TID) | ORAL | 0 refills | Status: DC | PRN

## 2018-02-04 MED ORDER — METHYLPREDNISOLONE SODIUM SUCC 125 MG IJ SOLR
125.0000 mg | Freq: Once | INTRAMUSCULAR | Status: AC
  Administered 2018-02-04: 125 mg via INTRAMUSCULAR
  Filled 2018-02-04: qty 2

## 2018-02-04 MED ORDER — METHOCARBAMOL 500 MG PO TABS: 1000.0000 mg | ORAL_TABLET | Freq: Once | ORAL | Status: DC

## 2018-02-04 NOTE — ED Provider Notes (Signed)
[facility] Provider Note: Georgena Spurling, MD, FACEP  CSN: [id number] MRN: [id number] ARRIVAL: 02/03/18 at 2334 ROOM: MH10/MH10   CHIEF COMPLAINT  Back Pain   HISTORY OF PRESENT ILLNESS  02/04/18 12:53 AM Melissa Bridges is a 57 y.o. female with a history of chronic back pain due to degenerative disc disease.  She was previously receiving 65, 5 mg oxycodone or hydrocodone tablets monthly from her primary care physician, Dr. Reginia Forts.  She has not had a prescription filled since August of last year.  He states she has not been able to follow-up with Dr. Tamala Julian since [place] side [place].  She has had a total of 6 opioid prescriptions in the past calendar year.  She is here complaining of episodic pain in her lower back consistent with previous back pain.  This is been going on for several months but acutely worsened in the past several days.  She attributes the exacerbation to a change in weather.  The pain is located in her left SI region radiating around to her left groin.  Pain is worse with movement of the left hip.  She denies associated numbness, weakness, bowel or bladder changes.  Past Medical History:  Diagnosis Date  . Allergy   . Alopecia   . Anxiety   . Arthritis   . Asthma   . Cataract    "mild"  . COPD (chronic obstructive pulmonary disease) ([place])   . Depression   . GERD (gastroesophageal reflux disease)    o cc- takes OTC if needed.  . Glaucoma   . Hepatitis C   . Hypertension    onset age 1.  . Iron deficiency anemia   . Migraine   . Schizoaffective disorder ([place])   . Sciatic pain   . Seizures ([place])    onset in childhood. 06-03-16- per pt 8 months agoGeneralized tonic-clonic.Last seizure couple of months ago- last sz 9-10 months ago per pt 10-12-2016  . Shortness of breath dyspnea   . Substance abuse ([place])   . Ulcer     Past Surgical History:  Procedure Laterality Date  . ABDOMINAL HYSTERECTOMY     ovarian cyst B, cervical  dysplasia, fibroids.   Ovaries intact.  . COLONOSCOPY    . COLONOSCOPY W/ BIOPSIES    . DILATION AND CURETTAGE OF UTERUS     x2  . EXPLORATORY LAPAROTOMY     x3  . GYNECOLOGIC CRYOSURGERY     x3  . MANDIBLE RECONSTRUCTION N/A 06/27/2015   Procedure: REMOVAL MAXILLARY PALATAL TORUS.  REMOVAL MANDIBULAR TORUS AND EXOSTOSIS.  ;  Surgeon: Diona Browner, DDS;  Location: [place];  Service: Oral Surgery;  Laterality: N/A;  . OVARIAN CYST REMOVAL  2008  . POLYPECTOMY      Family History  Problem Relation Age of Onset  . Diabetes type II Mother   . Hypertension Mother   . Arthritis Mother   . Diabetes Mother   . Mental illness Mother        bipolar  . Diabetes type II Maternal Aunt   . Hypertension Father   . Heart murmur Father   . Arthritis Father   . Stroke Father   . Alopecia Sister   . Alopecia Brother   . Alopecia Sister   . Mental retardation Sister        depression  . Prostate cancer Paternal Grandfather   . Cancer Maternal Uncle   . Colon cancer Neg Hx   .  Esophageal cancer Neg Hx   . Rectal cancer Neg Hx   . Stomach cancer Neg Hx   . Colon polyps Neg Hx     Social History   Tobacco Use  . Smoking status: Current Every Day Smoker    Packs/day: 0.33    Years: 44.00    Pack years: 14.52    Types: Cigarettes  . Smokeless tobacco: Never Used  Substance Use Topics  . Alcohol use: Yes    Alcohol/week: 0.0 oz    Comment: occasionally  . Drug use: No    Comment: no longer using    Prior to Admission medications   Medication Sig Start Date End Date Taking? Authorizing Provider  albuterol (VENTOLIN HFA) 108 (90 Base) MCG/ACT inhaler INHALE TWO PUFFS BY MOUTH EVERY 6 HOURS AS NEEDED FOR SHORTNESS OF BREATH 07/29/17   Wardell Honour, MD  amLODipine (NORVASC) 5 MG tablet Take 1 tablet (5 mg total) by mouth daily. 07/29/17   Wardell Honour, MD  budesonide-formoterol [facility]) 80-4.5 MCG/ACT inhaler Inhale 2 puffs into the lungs 2 (two) times daily. 09/02/17   Wardell Honour, MD  celecoxib (CELEBREX) 200 MG capsule Take 1 capsule (200 mg total) by mouth daily. 07/29/17   Wardell Honour, MD  citalopram (CELEXA) 10 MG tablet Take 1 tablet (10 mg total) by mouth daily. For depression 07/28/17   Arfeen, Arlyce Harman, MD  ferrous sulfate 325 (65 FE) MG tablet Take 1 tablet (325 mg total) by mouth daily with breakfast. 09/04/16   Wardell Honour, MD  fluticasone [facility]) 50 MCG/ACT nasal spray Place 2 sprays into both nostrils daily. Patient taking differently: Place 2 sprays into both nostrils daily as needed for allergies.  06/08/16   Ivar Drape D, PA  fluticasone-salmeterol (ADVAIR HFA) 211-94 MCG/ACT inhaler Inhale 2 puffs into the lungs 2 (two) times daily. 07/29/17   Wardell Honour, MD  gabapentin (NEURONTIN) 300 MG capsule Take 300 mg by mouth 2 (two) times daily.    [provider]  ipratropium-albuterol (DUONEB) 0.5-2.5 (3) MG/3ML SOLN Take 3 mLs by nebulization every 4 (four) hours as needed (shortness of breath). Reported on 12/02/2015    [provider]  LamoTRIgine (LAMICTAL XR) 25 MG TB24 tablet take 1 tablet daily for 2 weeks, then increase to 2 tablets daily for 2 weeks, then increase to 4 tablets daily then see me in follow-up Patient taking differently: Take 100 mg by mouth at bedtime. take 1 tablet daily for 2 weeks, then increase to 2 tablets daily for 2 weeks, then increase to 4 tablets daily then see me in follow-up 12/02/15   Cameron Sprang, MD  methocarbamol (ROBAXIN) 500 MG tablet Take 1-2 tablets (500-1,000 mg total) by mouth every 8 (eight) hours as needed for muscle spasms. 09/02/17   Wardell Honour, MD  metroNIDAZOLE (FLAGYL) 500 MG tablet Take 1 tablet (500 mg total) by mouth 2 (two) times daily. 09/28/17   Montine Circle, PA-C  naproxen (NAPROSYN) 500 MG tablet Take 500 mg by mouth 2 (two) times daily as needed for moderate pain.    [provider]  oxyCODONE (OXY IR/ROXICODONE) 5 MG immediate release tablet  Take 1 tablet (5 mg total) by mouth 2 (two) times daily as needed for severe pain. Patient taking differently: Take 5-10 mg by mouth 2 (two) times daily as needed for severe pain.  09/02/17   Wardell Honour, MD  QUEtiapine (SEROQUEL) 300 MG tablet Take 1 tablet (300  mg total) by mouth at bedtime. Mood control 07/28/17   Arfeen, Arlyce Harman, MD  triamcinolone cream (KENALOG) 0.1 % APPLY  CREAM EXTERNALLY TWICE DAILY Patient taking differently: Apply 1 application topically 2 (two) times daily as needed (rash).  09/02/17   Wardell Honour, MD  valACYclovir (VALTREX) 1000 MG tablet Take 2 tabs po at symptom onset, repeat in 12 hours Patient taking differently: Take 2,000 mg by mouth daily as needed (flare ups).  07/29/17   Wardell Honour, MD    Allergies Fruit & vegetable daily [nutritional supplements]; Iodinated diagnostic agents; Ibuprofen; and Aspirin   REVIEW OF SYSTEMS  Negative except as noted here or in the History of Present Illness.   PHYSICAL EXAMINATION  Initial Vital Signs Blood pressure (!) 156/81, pulse 77, temperature 98.2 F (36.8 C), temperature source Oral, resp. rate 18, SpO2 100 %.  Examination General: Well-developed, thin female in no acute distress; appearance consistent with age of record HENT: normocephalic; atraumatic Eyes: pupils equal, round and reactive to light; extraocular muscles intact Neck: supple Heart: regular rate and rhythm Lungs: clear to auscultation bilaterally Abdomen: soft; nondistended; nontender; bowel sounds present Extremities: No deformity; full range of motion except left hip; pulses normal Back: Left SI tenderness; positive straight leg raise on the left at 30 degrees Neurologic: Awake, alert and oriented; motor function intact in all extremities and symmetric; sensation intact in the lower extremities and symmetric; no facial droop Skin: Warm and dry Psychiatric: Normal mood and affect   RESULTS  Summary of this visit's results, reviewed  by myself:   EKG Interpretation  Date/Time:    Ventricular Rate:    PR Interval:    QRS Duration:   QT Interval:    QTC Calculation:   R Axis:     Text Interpretation:        Laboratory Studies: Results for orders placed or performed during the hospital encounter of 02/04/18 (from the past 24 hour(s))  Urinalysis, Routine w reflex microscopic     Status: Abnormal   Collection Time: 02/04/18 12:45 AM  Result Value Ref Range   Color, Urine YELLOW YELLOW   APPearance CLEAR CLEAR   Specific Gravity, Urine <1.005 (L) 1.005 - 1.030   pH 5.5 5.0 - 8.0   Glucose, UA NEGATIVE NEGATIVE mg/dL   Hgb urine dipstick TRACE (A) NEGATIVE   Bilirubin Urine NEGATIVE NEGATIVE   Ketones, ur NEGATIVE NEGATIVE mg/dL   Protein, ur NEGATIVE NEGATIVE mg/dL   Nitrite NEGATIVE NEGATIVE   Leukocytes, UA NEGATIVE NEGATIVE  Rapid urine drug screen (hospital performed)     Status: Abnormal   Collection Time: 02/04/18 12:45 AM  Result Value Ref Range   Opiates NONE DETECTED NONE DETECTED   Cocaine POSITIVE (A) NONE DETECTED   Benzodiazepines NONE DETECTED NONE DETECTED   Amphetamines NONE DETECTED NONE DETECTED   Tetrahydrocannabinol POSITIVE (A) NONE DETECTED   Barbiturates NONE DETECTED NONE DETECTED  Urinalysis, Microscopic (reflex)     Status: Abnormal   Collection Time: 02/04/18 12:45 AM  Result Value Ref Range   RBC / HPF 0-5 0 - 5 RBC/hpf   WBC, UA 0-5 0 - 5 WBC/hpf   Bacteria, UA MANY (A) NONE SEEN   Squamous Epithelial / LPF 6-30 (A) NONE SEEN   Imaging Studies: No results found.  ED COURSE  Nursing notes and initial vitals signs, including pulse oximetry, reviewed.  Vitals:   02/03/18 2353  BP: (!) 156/81  Pulse: 77  Resp: 18  Temp: 98.2  F (36.8 C)  TempSrc: Oral  SpO2: 100%   Will avoid narcotics given patient's positive drug screen.  She does have a history of substance abuse.  We will treat her with methylprednisolone and Robaxin and have her follow back up with Dr.  Tamala Julian.  PROCEDURES    ED DIAGNOSES     ICD-10-CM   1. Chronic left-sided low back pain with left-sided sciatica M54.42    G89.29        Laney Louderback, Jenny Reichmann, MD 02/04/18 [phone]

## 2018-02-04 NOTE — ED Notes (Signed)
C/o left side back pain off and on for months  Worse on cold rainy days

## 2018-04-10 ENCOUNTER — Other Ambulatory Visit: Payer: Self-pay

## 2018-04-10 ENCOUNTER — Encounter: Payer: Self-pay | Admitting: Family Medicine

## 2018-04-10 ENCOUNTER — Telehealth: Payer: Self-pay | Admitting: Family Medicine

## 2018-04-10 ENCOUNTER — Ambulatory Visit (INDEPENDENT_AMBULATORY_CARE_PROVIDER_SITE_OTHER): Payer: 59 | Admitting: Family Medicine

## 2018-04-10 ENCOUNTER — Ambulatory Visit (INDEPENDENT_AMBULATORY_CARE_PROVIDER_SITE_OTHER): Payer: 59

## 2018-04-10 VITALS — BP 190/60 | HR 82 | Temp 97.4°F | Ht 63.0 in | Wt 114.2 lb

## 2018-04-10 DIAGNOSIS — R233 Spontaneous ecchymoses: Secondary | ICD-10-CM

## 2018-04-10 DIAGNOSIS — R3915 Urgency of urination: Secondary | ICD-10-CM | POA: Diagnosis not present

## 2018-04-10 DIAGNOSIS — R945 Abnormal results of liver function studies: Secondary | ICD-10-CM

## 2018-04-10 DIAGNOSIS — M25552 Pain in left hip: Secondary | ICD-10-CM | POA: Diagnosis not present

## 2018-04-10 DIAGNOSIS — G8929 Other chronic pain: Secondary | ICD-10-CM

## 2018-04-10 DIAGNOSIS — M5442 Lumbago with sciatica, left side: Secondary | ICD-10-CM | POA: Diagnosis not present

## 2018-04-10 DIAGNOSIS — R7989 Other specified abnormal findings of blood chemistry: Secondary | ICD-10-CM

## 2018-04-10 DIAGNOSIS — F558 Abuse of other non-psychoactive substances: Secondary | ICD-10-CM | POA: Diagnosis not present

## 2018-04-10 DIAGNOSIS — F1910 Other psychoactive substance abuse, uncomplicated: Secondary | ICD-10-CM | POA: Diagnosis not present

## 2018-04-10 DIAGNOSIS — R109 Unspecified abdominal pain: Secondary | ICD-10-CM | POA: Diagnosis not present

## 2018-04-10 DIAGNOSIS — R319 Hematuria, unspecified: Secondary | ICD-10-CM

## 2018-04-10 DIAGNOSIS — R238 Other skin changes: Secondary | ICD-10-CM

## 2018-04-10 LAB — POCT CBC
Granulocyte percent: 73.8 % (ref 37–80)
HCT, POC: 33.4 % — AB (ref 37.7–47.9)
Hemoglobin: 10.7 g/dL — AB (ref 12.2–16.2)
Lymph, poc: 1.9 (ref 0.6–3.4)
MCH, POC: 30.5 pg (ref 27–31.2)
MCHC: 32 g/dL (ref 31.8–35.4)
MCV: 95.2 fL (ref 80–97)
MID (cbc): 0.2 (ref 0–0.9)
MPV: 7 fL (ref 0–99.8)
POC Granulocyte: 5.9 (ref 2–6.9)
POC LYMPH PERCENT: 24 % (ref 10–50)
POC MID %: 2.2 % (ref 0–12)
Platelet Count, POC: 329 10*3/uL (ref 142–424)
RBC: 3.51 M/uL — AB (ref 4.04–5.48)
RDW, POC: 16.3 %
WBC: 8 10*3/uL (ref 4.6–10.2)

## 2018-04-10 LAB — COMPREHENSIVE METABOLIC PANEL WITH GFR
ALT: 43 [IU]/L — ABNORMAL HIGH (ref 0–32)
AST: 64 [IU]/L — ABNORMAL HIGH (ref 0–40)
Albumin/Globulin Ratio: 1.2 (ref 1.2–2.2)
Albumin: 4.7 g/dL (ref 3.5–5.5)
Alkaline Phosphatase: 92 [IU]/L (ref 39–117)
BUN/Creatinine Ratio: 10 (ref 9–23)
BUN: 9 mg/dL (ref 6–24)
Bilirubin Total: 0.4 mg/dL (ref 0.0–1.2)
CO2: 27 mmol/L (ref 20–29)
Calcium: 9.8 mg/dL (ref 8.7–10.2)
Chloride: 101 mmol/L (ref 96–106)
Creatinine, Ser: 0.86 mg/dL (ref 0.57–1.00)
GFR calc Af Amer: 86 mL/min/{1.73_m2}
GFR calc non Af Amer: 74 mL/min/{1.73_m2}
Globulin, Total: 4 g/dL (ref 1.5–4.5)
Glucose: 90 mg/dL (ref 65–99)
Potassium: 3.8 mmol/L (ref 3.5–5.2)
Sodium: 132 mmol/L — ABNORMAL LOW (ref 134–144)
Total Protein: 8.7 g/dL — ABNORMAL HIGH (ref 6.0–8.5)

## 2018-04-10 LAB — POCT URINALYSIS DIP (MANUAL ENTRY)
Bilirubin, UA: NEGATIVE
Glucose, UA: NEGATIVE mg/dL
Ketones, POC UA: NEGATIVE mg/dL
Leukocytes, UA: NEGATIVE
Nitrite, UA: NEGATIVE
Protein Ur, POC: NEGATIVE mg/dL
Spec Grav, UA: 1.02
Urobilinogen, UA: 0.2 U/dL
pH, UA: 6.5

## 2018-04-10 LAB — POC MICROSCOPIC URINALYSIS (UMFC): Mucus: ABSENT

## 2018-04-10 MED ORDER — GABAPENTIN 300 MG PO CAPS: 300.0000 mg | ORAL_CAPSULE | Freq: Two times a day (BID) | ORAL | 0 refills | Status: DC

## 2018-04-10 MED ORDER — CYCLOBENZAPRINE HCL 5 MG PO TABS: ORAL_TABLET | ORAL | 0 refills | Status: DC

## 2018-04-10 NOTE — Progress Notes (Signed)
Subjective:  By signing my name below, I, Essence Howell, attest that this documentation has been prepared under the direction and in the presence of Wendie Agreste, MD Electronically Signed: Ladene Artist, ED Scribe 04/10/2018 at 11:27 AM.   Patient ID: Melissa Bridges, female    DOB: 07-31-1961, 57 y.o.   MRN: [id number]  Chief Complaint  Patient presents with  . left side    left side pain more towards the abdomen and to the back. started 4 months ago. last 2 months pain is constant off and on with pain 9/10   HPI Melissa Bridges is a 57 y.o. female who presents to Primary Care at [facility] complaining of L-sided back pain x 4 months, worse for past 2 months. Last seen in 2016 for sciatica on the R side. Treated for back pain multiple times in the past. Appears she has been evaluated by neurosurgery with MRI of l-spine June 2018 dominant L sided abnormality L3-L4 with protrusion, posterior element hypertrophy, mild stenosis. Did have renal stone study Oct 2018, neg for stone. Seen oct 2018 with L-sided Bridges back pain and again in Feb with chronic L sided Bridges back pain. She received oxycodone for ongoing back pain by PCP in the past. When seen in ER in Feb she reported worsening of typical back pain for a few days. Positive drug screen for cocaine and THC at that time. Given solu-medrol and robaxin with plan of f/u with Dr. Tamala Julian. On review of problem list, h/o substance abuse. Does appear that pain contract was provided by Dr. Tamala Julian in June 2018.  Today, pt report L-sided back pain over the past 4 months, worse over the past 2 months. Pt reports that pain now radiates into her L abdomen, wraps around into her L back and radiates down into her L knee. Pt states she has fallen after her L leg occasionally gives out and she has experienced some bladder incontinence and urinary urgency over the past few wks. She also reports bruising to her forearms first noticed 3 months ago which she attributes to taking  3000 mg acetaminophen every 3 hrs for several wks. She has been out of gabapentin for a few months. Multiple notes reviewed including 07/2017, possible gastritis from excessive NSAID use. Gabapentin was increased to 1 in the morning and 2 at night. Pt states that her insurance will not refill Robaxin.  H/o chronic hep C. Denies injectables. She does report marijuana use with the last being Christmas and cocaine with the last use being last month. She is still smoking cigarettes; down to 1 pack every 3 days.  Lab Results  Component Value Date   ALT 64 (H) 07/29/2017   AST 128 (H) 07/29/2017   ALKPHOS 108 07/29/2017   BILITOT 0.6 07/29/2017   Patient Active Problem List   Diagnosis Date Noted  . Serrated adenoma of colon 08/05/2016  . Localization-related idiopathic epilepsy and epileptic syndromes with seizures of localized onset, not intractable, without status epilepticus ([place]) 10/16/2015  . 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 Alcohol abuse 09/06/2014  . Asthma with acute exacerbation 03/09/2013  . Hypertension 03/09/2013  . Lower back pain 03/09/2013  . Chronic hepatitis C without hepatic coma ([place]) 03/08/2013  . Smoker 12/12/2012  . Schizoaffective disorder ([place]) 12/12/2012  . Substance abuse ([place]) 11/14/2011   Past Medical History:  Diagnosis Date  . Allergy   . Alopecia   . Anxiety   . Arthritis   . Asthma   . Cataract    "  mild"  . COPD (chronic obstructive pulmonary disease) ([place])   . Depression   . GERD (gastroesophageal reflux disease)    o cc- takes OTC if needed.  . Glaucoma   . Hepatitis C   . Hypertension    onset age 80.  . Iron deficiency anemia   . Migraine   . Schizoaffective disorder ([place])   . Sciatic pain   . Seizures ([place])    onset in childhood. 06-03-16- per pt 8 months agoGeneralized tonic-clonic.Last seizure couple of months ago- last sz 9-10 months ago per pt 10-12-2016  . Shortness of breath dyspnea   . Substance abuse ([place])   . Ulcer    Past  Surgical History:  Procedure Laterality Date  . ABDOMINAL HYSTERECTOMY     ovarian cyst B, cervical dysplasia, fibroids.   Ovaries intact.  . COLONOSCOPY    . COLONOSCOPY W/ BIOPSIES    . DILATION AND CURETTAGE OF UTERUS     x2  . EXPLORATORY LAPAROTOMY     x3  . GYNECOLOGIC CRYOSURGERY     x3  . MANDIBLE RECONSTRUCTION N/A 06/27/2015   Procedure: REMOVAL MAXILLARY PALATAL TORUS.  REMOVAL MANDIBULAR TORUS AND EXOSTOSIS.  ;  Surgeon: Diona Browner, DDS;  Location: [facility];  Service: Oral Surgery;  Laterality: N/A;  . OVARIAN CYST REMOVAL  2008  . POLYPECTOMY     Allergies  Allergen Reactions  . Fruit & Vegetable Daily [Nutritional Supplements] Shortness Of Breath    Aloe  . Iodinated Diagnostic Agents Swelling    Swelling and itching of left side of her face only after CT SI injection(no steroid used)  . Ibuprofen Other (See Comments)    Stomach upset; However, pt can take Meloxicam without incident and takes this at home.   . Aspirin Rash and Other (See Comments)    Stomach upset   Prior to Admission medications   Medication Sig Start Date End Date Taking? Authorizing Provider  albuterol (VENTOLIN HFA) 108 (90 Base) MCG/ACT inhaler INHALE TWO PUFFS BY MOUTH EVERY 6 HOURS AS NEEDED FOR SHORTNESS OF BREATH 07/29/17   Wardell Honour, MD  amLODipine (NORVASC) 5 MG tablet Take 1 tablet (5 mg total) by mouth daily. 07/29/17   Wardell Honour, MD  budesonide-formoterol [facility]) 80-4.5 MCG/ACT inhaler Inhale 2 puffs into the lungs 2 (two) times daily. 09/02/17   Wardell Honour, MD  citalopram (CELEXA) 10 MG tablet Take 1 tablet (10 mg total) by mouth daily. For depression 07/28/17   Arfeen, Arlyce Harman, MD  ferrous sulfate 325 (65 FE) MG tablet Take 1 tablet (325 mg total) by mouth daily with breakfast. 09/04/16   Wardell Honour, MD  fluticasone [facility]) 50 MCG/ACT nasal spray Place 2 sprays into both nostrils daily. Patient taking differently: Place 2 sprays into both nostrils daily as needed  for allergies.  06/08/16   Ivar Drape D, PA  fluticasone-salmeterol (ADVAIR HFA) 413-24 MCG/ACT inhaler Inhale 2 puffs into the lungs 2 (two) times daily. 07/29/17   Wardell Honour, MD  gabapentin (NEURONTIN) 300 MG capsule Take 300 mg by mouth 2 (two) times daily.    [provider]  ipratropium-albuterol (DUONEB) 0.5-2.5 (3) MG/3ML SOLN Take 3 mLs by nebulization every 4 (four) hours as needed (shortness of breath). Reported on 12/02/2015    [provider]  LamoTRIgine (LAMICTAL XR) 25 MG TB24 tablet take 1 tablet daily for 2 weeks, then increase to 2 tablets daily for 2 weeks, then increase to 4 tablets  daily then see me in follow-up Patient taking differently: Take 100 mg by mouth at bedtime. take 1 tablet daily for 2 weeks, then increase to 2 tablets daily for 2 weeks, then increase to 4 tablets daily then see me in follow-up 12/02/15   Cameron Sprang, MD  methocarbamol (ROBAXIN) 500 MG tablet Take 1-2 tablets (500-1,000 mg total) by mouth every 8 (eight) hours as needed for muscle spasms. 02/04/18   Molpus, John, MD  metroNIDAZOLE (FLAGYL) 500 MG tablet Take 1 tablet (500 mg total) by mouth 2 (two) times daily. 09/28/17   Montine Circle, PA-C  naproxen (NAPROSYN) 500 MG tablet Take 500 mg by mouth 2 (two) times daily as needed for moderate pain.    [provider]  QUEtiapine (SEROQUEL) 300 MG tablet Take 1 tablet (300 mg total) by mouth at bedtime. Mood control 07/28/17   Arfeen, Arlyce Harman, MD  triamcinolone cream (KENALOG) 0.1 % APPLY  CREAM EXTERNALLY TWICE DAILY Patient taking differently: Apply 1 application topically 2 (two) times daily as needed (rash).  09/02/17   Wardell Honour, MD  valACYclovir (VALTREX) 1000 MG tablet Take 2 tabs po at symptom onset, repeat in 12 hours Patient taking differently: Take 2,000 mg by mouth daily as needed (flare ups).  07/29/17   Wardell Honour, MD   Social History   Socioeconomic History  . Marital status: Widowed     Spouse name: Not on file  . Number of children: 1  . Years of education: Not on file  . Highest education level: Not on file  Occupational History  . Occupation: disabled    Comment: mental illness; seizures  Social Needs  . Financial resource strain: Not on file  . Food insecurity:    Worry: Not on file    Inability: Not on file  . Transportation needs:    Medical: Not on file    Non-medical: Not on file  Tobacco Use  . Smoking status: Current Every Day Smoker    Packs/day: 0.33    Years: 44.00    Pack years: 14.52    Types: Cigarettes  . Smokeless tobacco: Never Used  Substance and Sexual Activity  . Alcohol use: Yes    Alcohol/week: 0.0 oz    Comment: occasionally  . Drug use: No    Types: Marijuana    Comment: no longer using  . Sexual activity: Not Currently    Birth control/protection: None    Comment: widow  Lifestyle  . Physical activity:    Days per week: Not on file    Minutes per session: Not on file  . Stress: Not on file  Relationships  . Social connections:    Talks on phone: Not on file    Gets together: Not on file    Attends religious service: Not on file    Active member of club or organization: Not on file    Attends meetings of clubs or organizations: Not on file    Relationship status: Not on file  . Intimate partner violence:    Fear of current or ex partner: Not on file    Emotionally abused: Not on file    Physically abused: Not on file    Forced sexual activity: Not on file  Other Topics Concern  . Not on file  Social History Narrative   Marital status:  Widowed since 2002.  Married x 16 years. + dating.  Moved from [place] to live with daughter in 2013.  Children:  One child/daughter (33); two grandchildren.      Lives: with daughter, grandchildren 2.  Does not drive due to epilepsy.      Employment:  Disability for schizoaffective disorder.      Tobacco: 1 ppd x since 8th grade.      Alcohol:  Social; rare drinking due to  seizure medications.  Weekends.       Drugs: none; previous use of marijuana.  Previous iv drug use, cocaine.      Exercise: none      Seatbelt:  100%      Guns: none   Review of Systems  Gastrointestinal: Positive for abdominal pain.  Genitourinary: Positive for enuresis and urgency.  Musculoskeletal: Positive for back pain.  Skin: Positive for color change.      Objective:   Physical Exam  Constitutional: She is oriented to person, place, and time. She appears well-developed and well-nourished. No distress.  HENT:  Head: Normocephalic and atraumatic.  Eyes: Conjunctivae and EOM are normal.  Neck: Neck supple. No tracheal deviation present.  Cardiovascular: Normal rate.  Pulmonary/Chest: Effort normal. No respiratory distress.  Abdominal: She exhibits no distension. There is tenderness in the right upper quadrant, suprapubic area and left upper quadrant.  Tender along RUQ. Diffusely tender including LUQ and suprapubic. No apparent distension.  Musculoskeletal: Normal range of motion.  Pt complains of pain diffusely from L lower spine, down L buttock into L LE. Positive straight leg raise with pain into L Bridges back, thigh and groin. Slight discomfort into L hip with internal/external rotation.  Neurological: She is alert and oriented to person, place, and time.  Skin: Skin is warm and dry. Ecchymosis noted.  Scattered small ecchymosis on forearms, L greater than R. Legs appear to be uninvolved.  Psychiatric: She has a normal mood and affect. Her behavior is normal.  Nursing note and vitals reviewed.  Vitals:   04/10/18 1129  BP: (!) 190/60  Pulse: 82  Temp: (!) 97.4 F (36.3 C)  TempSrc: Oral  SpO2: 99%  Weight: 114 lb 3.2 oz (51.8 kg)  Height: 5\' 3"  (1.6 m)   Results for orders placed or performed in visit on 04/10/18  POCT CBC  Result Value Ref Range   WBC 8.0 4.6 - 10.2 K/uL   Lymph, poc 1.9 0.6 - 3.4   POC LYMPH PERCENT 24.0 10 - 50 %L   MID (cbc) 0.2 0 - 0.9    POC MID % 2.2 0 - 12 %M   POC Granulocyte 5.9 2 - 6.9   Granulocyte percent 73.8 37 - 80 %G   RBC 3.51 (A) 4.04 - 5.48 M/uL   Hemoglobin 10.7 (A) 12.2 - 16.2 g/dL   HCT, POC 33.4 (A) 37.7 - 47.9 %   MCV 95.2 80 - 97 fL   MCH, POC 30.5 27 - 31.2 pg   MCHC 32.0 31.8 - 35.4 g/dL   RDW, POC 16.3 %   Platelet Count, POC 329 142 - 424 K/uL   MPV 7.0 0 - 99.8 fL  POCT urinalysis dipstick  Result Value Ref Range   Color, UA yellow yellow   Clarity, UA clear clear   Glucose, UA negative negative mg/dL   Bilirubin, UA negative negative   Ketones, POC UA negative negative mg/dL   Spec Grav, UA 1.020 1.010 - 1.025   Blood, UA trace-lysed (A) negative   pH, UA 6.5 5.0 - 8.0   Protein Ur, POC negative negative mg/dL  Urobilinogen, UA 0.2 0.2 or 1.0 E.U./dL   Nitrite, UA Negative Negative   Leukocytes, UA Negative Negative  POCT Microscopic Urinalysis ([facility])  Result Value Ref Range   WBC,UR,HPF,POC None None WBC/hpf   RBC,UR,HPF,POC Few (A) None RBC/hpf   Bacteria None None, Too numerous to count   Mucus Absent Absent   Epithelial Cells, UR Per Microscopy Few (A) None, Too numerous to count cells/hpf      Assessment & Plan:    Melissa Bridges is a 57 y.o. 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  Reassuring CBC from infectious standpoint, symptoms are  likely due to her chronic Bridges back pain with interval flare.  Unfortunately she has significantly overused acetaminophen and with new bruising concern for liver damage.  Platelets were okay on check in office.  -minimal hematuria on urinalysis, less likely infection, check urine culture - RTC precautions if worsening abdominal pain, fever.   -Hold on further acetaminophen products due to potential for liver injury with overuse of acetaminophen.  LFTs were obtained stat that actually were improved from previous readings.  -Initially restart gabapentin and Flexeril.  Potential combination side effects were discussed.  Discussed concerns for future narcotic pain medication if chosen by primary provider given her polysubstance abuse.  Abstinence of illicit drug use was discussed.  She also likely needs to meet with her back specialist or neurosurgeon to determine next step in treatment.  -Slight anemia, plan for recheck CBC with primary care provider next 1 week, but ER/RTC precautions given if new lightheadedness, dizziness, worsening abdominal pain.  -Elevated blood pressure noted, likely from pain component back.  Treat as above with Flexeril, gabapentin, and close follow-up.   Meds ordered this encounter  Medications  . gabapentin (NEURONTIN) 300 MG capsule    Sig: Take 1 capsule (300 mg total) by mouth 2 (two) times daily. Start with one at night initially.    Dispense:  60 capsule    Refill:  0  . cyclobenzaprine (FLEXERIL) 5 MG tablet    Sig: 1 pill by mouth up to every 8 hours as needed.    Dispense:  15 tablet    Refill:  0   Patient Instructions   Based on overuse of acetaminophen, I will check a stat liver test today. Depending on those results, you may need to be seen in the hospital. we will let you know later today.  Avoid any further acetaminophen for now.   I would recommend meeting with your neurosurgeon as soon as possible for the worsening back pain.  If you have return of loss  of urine, or leg weakness - go to the emergency room.   Restart gabapentin - initially once at night, then if tolerating that dose in 4-5 days, you can increase to twice per day.   I will refill flexeril Bridges dose temporarily for your back pain until you can be seen by your specialist. Please be careful combining that  with other sedating medicines including the gabapentin as it can increase risk of falling.   Your blood count was slightly Bridges today (anemia).  This will need to be repeated in the next 1 week depending on other testing from today.  If any dark/tarry colored stools, lightheadedness, or dizziness, proceed to the emergency room right away.  Please follow up with Dr. Tamala Julian in the next 1 week to recheck some lab work, discuss back and hip pain plan further.   Return to the clinic or go to the nearest emergency room if any of your symptoms worsen or new symptoms occur.   Back Pain, Adult Many adults have back pain from time to time. Common causes of back pain include:  A strained muscle or ligament.  Wear and tear (degeneration) of the spinal disks.  Arthritis.  A hit to the back.  Back pain can be short-lived (acute) or last a long time (chronic). A physical exam, lab tests, and imaging studies may be done to find the cause of your pain. Follow these instructions at home: Managing pain and stiffness  Take over-the-counter and prescription medicines only as told by your health care provider.  If directed, apply heat to the affected area as often as told by your health care provider. Use the heat source that your health care provider recommends, such as a moist heat pack or a heating pad. ? Place a towel between your skin and the heat source. ? Leave the heat on for 20-30 minutes. ? Remove the heat if your skin turns bright red. This is especially important if you are unable to feel pain, heat, or cold. You have a greater risk of getting burned.  If directed, apply ice to  the injured area: ? Put ice in a plastic bag. ? Place a towel between your skin and the bag. ? Leave the ice on for 20 minutes, 2-3 times a day for the first 2-3 days. Activity  Do not stay in bed. Resting more than 1-2 days can delay your recovery.  Take short walks on even surfaces as soon as you are able. Try to increase the length of time you walk each day.  Do not sit, drive, or stand in one place for more than 30 minutes at a time. Sitting or standing for long periods of time can put stress on your back.  Use proper lifting techniques. When you bend and lift, use positions that put less stress on your back: ? Morris your knees. ? Keep the load close to your body. ? Avoid twisting.  Exercise regularly as told by your health care provider. Exercising will help your back heal faster. This also helps prevent back injuries by keeping muscles strong and flexible.  Your health care provider may recommend that you see a physical therapist. This person can help you come up with a safe exercise program. Do any exercises as told by your physical therapist. Lifestyle  Maintain a healthy weight. Extra weight puts stress on your back and makes it difficult to have good posture.  Avoid activities or situations that make you feel anxious or stressed. Learn ways to manage anxiety and stress. One way to manage stress is through exercise. Stress and anxiety increase muscle tension and can make back pain worse. General instructions  Sleep on a firm mattress in a comfortable position. Try lying on your side with your knees slightly bent. If you lie on your back, put a pillow under your knees.  Follow  your treatment plan as told by your health care provider. This may include: ? Cognitive or behavioral therapy. ? Acupuncture or massage therapy. ? Meditation or yoga. Contact a health care provider if:  You have pain that is not relieved with rest or medicine.  You have increasing pain going down  into your legs or buttocks.  Your pain does not improve in 2 weeks.  You have pain at night.  You lose weight.  You have a fever or chills. Get help right away if:  You develop new bowel or bladder control problems.  You have unusual weakness or numbness in your arms or legs.  You develop nausea or vomiting.  You develop abdominal pain.  You feel faint. Summary  Many adults have back pain from time to time. A physical exam, lab tests, and imaging studies may be done to find the cause of your pain.  Use proper lifting techniques. When you bend and lift, use positions that put less stress on your back.  Take over-the-counter and prescription medicines and apply heat or ice as directed by your health care provider. This information is not intended to replace advice given to you by your health care provider. Make sure you discuss any questions you have with your health care provider. Document Released: 11/29/2005 Document Revised: 01/03/2017 Document Reviewed: 01/03/2017 Elsevier Interactive Patient Education  2018 [employer].     IF you received an x-ray today, you will receive an invoice from [facility]. Please contact [facility] at [phone] with questions or concerns regarding your invoice.   IF you received labwork today, you will receive an invoice from [facility]. Please contact [facility] at [phone] with questions or concerns regarding your invoice.   Our billing staff will not be able to assist you with questions regarding bills from these companies.  You will be contacted with the lab results as soon as they are available. The fastest way to get your results is to activate your My Chart account. Instructions are located on the last page of this paperwork. If you have not heard from [place] regarding the results in 2 weeks, please contact this office.       I personally performed the services described in this documentation, which was scribed  in my presence. The recorded information has been reviewed and considered for accuracy and completeness, addended by me as needed, and agree with information above.  Signed,   [name], MD Primary [facility] at [place].  04/12/18 3:42 PM

## 2018-04-10 NOTE — Telephone Encounter (Signed)
Pain medication request already sent to primary.

## 2018-04-10 NOTE — Telephone Encounter (Signed)
Pt due to f/u with on 5/6

## 2018-04-10 NOTE — Telephone Encounter (Signed)
Alicia from [employer] called to report lab results for the CMP only. A copy will be faxed to the office as well. Routing note back to office high priority. Phoned office and and called report to Dr Reginia Forts.  Comprehensive metabolic panel  Order: [id number]  Status:  Final result  Visible to patient:  No (Not Released)  Next appt:  04/17/2018 at 11:40 AM in [place] Reginia Forts, MD)  Dx:  Elevated LFTs; Acetaminophen abuse   Ref Range & Units 11:58  Glucose 65 - 99 mg/dL 90   BUN 6 - 24 mg/dL 9   Creatinine, Ser 0.57 - 1.00 mg/dL 0.86   GFR calc non Af Amer >59 mL/min/1.73 74   GFR calc Af Amer >59 mL/min/1.73 86   BUN/Creatinine Ratio 9 - 23 10   Sodium 134 - 144 mmol/L 132Low    Potassium 3.5 - 5.2 mmol/L 3.8   Chloride 96 - 106 mmol/L 101   CO2 20 - 29 mmol/L 27   Calcium 8.7 - 10.2 mg/dL 9.8   Total Protein 6.0 - 8.5 g/dL 8.7High    Albumin 3.5 - 5.5 g/dL 4.7   Globulin, Total 1.5 - 4.5 g/dL 4.0   Albumin/Globulin Ratio 1.2 - 2.2 1.2   Bilirubin Total 0.0 - 1.2 mg/dL 0.4   Alkaline Phosphatase 39 - 117 IU/L 92   AST 0 - 40 IU/L 64High    ALT 0 - 32 IU/L 43High    Resulting Agency  [facility]    Narrative  Performed by: Maryan Puls  Performed at: [facility] [place], [place], [place] [id number] Lab Director: Lindon Romp MD, Phone: [phone]    Specimen Collected: 04/10/18 11:58 Last Resulte

## 2018-04-10 NOTE — Telephone Encounter (Unsigned)
Copied from [place] [phone]. Topic: Quick Communication - See Telephone Encounter >> Apr 10, 2018  4:55 PM Percell Belt A wrote: CRM for notification. See Telephone encounter for: 04/10/18. Pt called in and said she is still in pain and would like to know if dr can give her something stronger.  Stated that she has a pain contract with her reg PCP .  She said that the meds she was giving was not working.  She also states she see prim later this week.

## 2018-04-10 NOTE — Telephone Encounter (Signed)
Pt given results per notes of Dr. Carlota Raspberry on 04/10/18.Unable to document in result note due to result note not being routed to [facility]. Pt also asking if medication could be called in for back pain. If medication can be called in pt would like it sent to [facility] on [address].

## 2018-04-10 NOTE — Patient Instructions (Addendum)
Based on overuse of acetaminophen, I will check a stat liver test today. Depending on those results, you may need to be seen in the hospital. we will let you know later today.  Avoid any further acetaminophen for now.   I would recommend meeting with your neurosurgeon as soon as possible for the worsening back pain.  If you have return of loss of urine, or leg weakness - go to the emergency room.   Restart gabapentin - initially once at night, then if tolerating that dose in 4-5 days, you can increase to twice per day.   I will refill flexeril low dose temporarily for your back pain until you can be seen by your specialist. Please be careful combining that with other sedating medicines including the gabapentin as it can increase risk of falling.   Your blood count was slightly low today (anemia).  This will need to be repeated in the next 1 week depending on other testing from today.  If any dark/tarry colored stools, lightheadedness, or dizziness, proceed to the emergency room right away.  Please follow up with Dr. Tamala Julian in the next 1 week to recheck some lab work, discuss back and hip pain plan further.   Return to the clinic or go to the nearest emergency room if any of your symptoms worsen or new symptoms occur.   Back Pain, Adult Many adults have back pain from time to time. Common causes of back pain include:  A strained muscle or ligament.  Wear and tear (degeneration) of the spinal disks.  Arthritis.  A hit to the back.  Back pain can be short-lived (acute) or last a long time (chronic). A physical exam, lab tests, and imaging studies may be done to find the cause of your pain. Follow these instructions at home: Managing pain and stiffness  Take over-the-counter and prescription medicines only as told by your health care provider.  If directed, apply heat to the affected area as often as told by your health care provider. Use the heat source that your health care provider  recommends, such as a moist heat pack or a heating pad. ? Place a towel between your skin and the heat source. ? Leave the heat on for 20-30 minutes. ? Remove the heat if your skin turns bright red. This is especially important if you are unable to feel pain, heat, or cold. You have a greater risk of getting burned.  If directed, apply ice to the injured area: ? Put ice in a plastic bag. ? Place a towel between your skin and the bag. ? Leave the ice on for 20 minutes, 2-3 times a day for the first 2-3 days. Activity  Do not stay in bed. Resting more than 1-2 days can delay your recovery.  Take short walks on even surfaces as soon as you are able. Try to increase the length of time you walk each day.  Do not sit, drive, or stand in one place for more than 30 minutes at a time. Sitting or standing for long periods of time can put stress on your back.  Use proper lifting techniques. When you bend and lift, use positions that put less stress on your back: ? Mission your knees. ? Keep the load close to your body. ? Avoid twisting.  Exercise regularly as told by your health care provider. Exercising will help your back heal faster. This also helps prevent back injuries by keeping muscles strong and flexible.  Your health care  provider may recommend that you see a physical therapist. This person can help you come up with a safe exercise program. Do any exercises as told by your physical therapist. Lifestyle  Maintain a healthy weight. Extra weight puts stress on your back and makes it difficult to have good posture.  Avoid activities or situations that make you feel anxious or stressed. Learn ways to manage anxiety and stress. One way to manage stress is through exercise. Stress and anxiety increase muscle tension and can make back pain worse. General instructions  Sleep on a firm mattress in a comfortable position. Try lying on your side with your knees slightly bent. If you lie on your back,  put a pillow under your knees.  Follow your treatment plan as told by your health care provider. This may include: ? Cognitive or behavioral therapy. ? Acupuncture or massage therapy. ? Meditation or yoga. Contact a health care provider if:  You have pain that is not relieved with rest or medicine.  You have increasing pain going down into your legs or buttocks.  Your pain does not improve in 2 weeks.  You have pain at night.  You lose weight.  You have a fever or chills. Get help right away if:  You develop new bowel or bladder control problems.  You have unusual weakness or numbness in your arms or legs.  You develop nausea or vomiting.  You develop abdominal pain.  You feel faint. Summary  Many adults have back pain from time to time. A physical exam, lab tests, and imaging studies may be done to find the cause of your pain.  Use proper lifting techniques. When you bend and lift, use positions that put less stress on your back.  Take over-the-counter and prescription medicines and apply heat or ice as directed by your health care provider. This information is not intended to replace advice given to you by your health care provider. Make sure you discuss any questions you have with your health care provider. Document Released: 11/29/2005 Document Revised: 01/03/2017 Document Reviewed: 01/03/2017 Elsevier Interactive Patient Education  2018 [employer].     IF you received an x-ray today, you will receive an invoice from [facility]. Please contact [facility] at [phone] with questions or concerns regarding your invoice.   IF you received labwork today, you will receive an invoice from [facility]. Please contact [facility] at [phone] with questions or concerns regarding your invoice.   Our billing staff will not be able to assist you with questions regarding bills from these companies.  You will be contacted with the lab results as soon as  they are available. The fastest way to get your results is to activate your My Chart account. Instructions are located on the last page of this paperwork. If you have not heard from [place] regarding the results in 2 weeks, please contact this office.

## 2018-04-11 NOTE — Telephone Encounter (Signed)
Rx for pain medication denied at this time.  Patient has signed pain contract with me as of 05/20/17 yet has not provided UDS thus no medications have been prescribed since 07/2017.

## 2018-04-11 NOTE — Telephone Encounter (Signed)
Dr. Carlota Raspberry advised in person to direct this to primary and that he would not be filling this request. In separate message I stated that pt has a f/u with primary on 5/6 and that this would be discussed at that time. This message should not have routed to primary as it was a duplicate as stated.

## 2018-04-11 NOTE — Telephone Encounter (Signed)
Pain medication prescription denied.  I have not seen patient since 08/2017.  Also, Dr. Carlota Raspberry declined rx for pain medication at visit this week.

## 2018-04-11 NOTE — Telephone Encounter (Signed)
Phone call to patient. Unable to reach. If patient calls back, please let her know Dr. Carlota Raspberry and Dr. Tamala Julian will not be sending in additional pain medicines for her. If plan from [facility] is not adequate at controlling her pain, needs to come back sooner to clinic or emergency room.

## 2018-04-12 LAB — URINE CULTURE

## 2018-04-17 ENCOUNTER — Ambulatory Visit: Payer: TRICARE For Life (TFL) | Admitting: Family Medicine

## 2018-05-08 ENCOUNTER — Encounter: Payer: Self-pay | Admitting: Family Medicine

## 2018-05-23 ENCOUNTER — Encounter (HOSPITAL_COMMUNITY): Payer: Self-pay | Admitting: Emergency Medicine

## 2018-05-23 ENCOUNTER — Other Ambulatory Visit: Payer: Self-pay

## 2018-05-23 ENCOUNTER — Observation Stay (HOSPITAL_COMMUNITY): Payer: 59

## 2018-05-23 ENCOUNTER — Observation Stay (HOSPITAL_COMMUNITY)
Admission: EM | Admit: 2018-05-23 | Discharge: 2018-05-24 | Disposition: A | Discharge status: Home / Self Care | Payer: 59 | Attending: Internal Medicine | Admitting: Internal Medicine

## 2018-05-23 DIAGNOSIS — I10 Essential (primary) hypertension: Secondary | ICD-10-CM | POA: Diagnosis not present

## 2018-05-23 DIAGNOSIS — F17210 Nicotine dependence, cigarettes, uncomplicated: Secondary | ICD-10-CM | POA: Diagnosis not present

## 2018-05-23 DIAGNOSIS — B182 Chronic viral hepatitis C: Secondary | ICD-10-CM | POA: Diagnosis not present

## 2018-05-23 DIAGNOSIS — F259 Schizoaffective disorder, unspecified: Secondary | ICD-10-CM | POA: Diagnosis not present

## 2018-05-23 DIAGNOSIS — H409 Unspecified glaucoma: Secondary | ICD-10-CM | POA: Insufficient documentation

## 2018-05-23 DIAGNOSIS — M545 Low back pain, unspecified: Secondary | ICD-10-CM | POA: Diagnosis present

## 2018-05-23 DIAGNOSIS — Z8249 Family history of ischemic heart disease and other diseases of the circulatory system: Secondary | ICD-10-CM | POA: Insufficient documentation

## 2018-05-23 DIAGNOSIS — F17200 Nicotine dependence, unspecified, uncomplicated: Secondary | ICD-10-CM | POA: Diagnosis present

## 2018-05-23 DIAGNOSIS — L659 Nonscarring hair loss, unspecified: Secondary | ICD-10-CM | POA: Insufficient documentation

## 2018-05-23 DIAGNOSIS — F250 Schizoaffective disorder, bipolar type: Secondary | ICD-10-CM | POA: Diagnosis present

## 2018-05-23 DIAGNOSIS — K219 Gastro-esophageal reflux disease without esophagitis: Secondary | ICD-10-CM | POA: Diagnosis not present

## 2018-05-23 DIAGNOSIS — R1084 Generalized abdominal pain: Secondary | ICD-10-CM | POA: Diagnosis present

## 2018-05-23 DIAGNOSIS — G8929 Other chronic pain: Secondary | ICD-10-CM | POA: Diagnosis not present

## 2018-05-23 DIAGNOSIS — Z90710 Acquired absence of both cervix and uterus: Secondary | ICD-10-CM | POA: Insufficient documentation

## 2018-05-23 DIAGNOSIS — Z79899 Other long term (current) drug therapy: Secondary | ICD-10-CM | POA: Insufficient documentation

## 2018-05-23 DIAGNOSIS — T391X1A Poisoning by 4-Aminophenol derivatives, accidental (unintentional), initial encounter: Principal | ICD-10-CM

## 2018-05-23 DIAGNOSIS — G40909 Epilepsy, unspecified, not intractable, without status epilepticus: Secondary | ICD-10-CM | POA: Diagnosis not present

## 2018-05-23 DIAGNOSIS — M25552 Pain in left hip: Secondary | ICD-10-CM | POA: Diagnosis not present

## 2018-05-23 DIAGNOSIS — Z91041 Radiographic dye allergy status: Secondary | ICD-10-CM | POA: Insufficient documentation

## 2018-05-23 DIAGNOSIS — F419 Anxiety disorder, unspecified: Secondary | ICD-10-CM | POA: Insufficient documentation

## 2018-05-23 DIAGNOSIS — R109 Unspecified abdominal pain: Secondary | ICD-10-CM

## 2018-05-23 DIAGNOSIS — Z820 Family history of epilepsy and other diseases of the nervous system: Secondary | ICD-10-CM | POA: Insufficient documentation

## 2018-05-23 DIAGNOSIS — Z8042 Family history of malignant neoplasm of prostate: Secondary | ICD-10-CM | POA: Insufficient documentation

## 2018-05-23 DIAGNOSIS — F1010 Alcohol abuse, uncomplicated: Secondary | ICD-10-CM | POA: Diagnosis not present

## 2018-05-23 DIAGNOSIS — Z810 Family history of intellectual disabilities: Secondary | ICD-10-CM | POA: Insufficient documentation

## 2018-05-23 DIAGNOSIS — F1910 Other psychoactive substance abuse, uncomplicated: Secondary | ICD-10-CM | POA: Diagnosis present

## 2018-05-23 DIAGNOSIS — G43909 Migraine, unspecified, not intractable, without status migrainosus: Secondary | ICD-10-CM | POA: Diagnosis not present

## 2018-05-23 DIAGNOSIS — F1410 Cocaine abuse, uncomplicated: Secondary | ICD-10-CM

## 2018-05-23 DIAGNOSIS — Z809 Family history of malignant neoplasm, unspecified: Secondary | ICD-10-CM | POA: Insufficient documentation

## 2018-05-23 DIAGNOSIS — Z7951 Long term (current) use of inhaled steroids: Secondary | ICD-10-CM | POA: Insufficient documentation

## 2018-05-23 DIAGNOSIS — K2970 Gastritis, unspecified, without bleeding: Secondary | ICD-10-CM | POA: Insufficient documentation

## 2018-05-23 DIAGNOSIS — M5442 Lumbago with sciatica, left side: Secondary | ICD-10-CM

## 2018-05-23 DIAGNOSIS — W19XXXA Unspecified fall, initial encounter: Secondary | ICD-10-CM | POA: Diagnosis not present

## 2018-05-23 DIAGNOSIS — Z86010 Personal history of colonic polyps: Secondary | ICD-10-CM | POA: Insufficient documentation

## 2018-05-23 DIAGNOSIS — R1110 Vomiting, unspecified: Secondary | ICD-10-CM | POA: Insufficient documentation

## 2018-05-23 DIAGNOSIS — N179 Acute kidney failure, unspecified: Secondary | ICD-10-CM

## 2018-05-23 DIAGNOSIS — D509 Iron deficiency anemia, unspecified: Secondary | ICD-10-CM | POA: Diagnosis not present

## 2018-05-23 DIAGNOSIS — J449 Chronic obstructive pulmonary disease, unspecified: Secondary | ICD-10-CM | POA: Insufficient documentation

## 2018-05-23 DIAGNOSIS — F329 Major depressive disorder, single episode, unspecified: Secondary | ICD-10-CM | POA: Diagnosis not present

## 2018-05-23 DIAGNOSIS — Z91018 Allergy to other foods: Secondary | ICD-10-CM | POA: Insufficient documentation

## 2018-05-23 DIAGNOSIS — T391X4A Poisoning by 4-Aminophenol derivatives, undetermined, initial encounter: Secondary | ICD-10-CM

## 2018-05-23 DIAGNOSIS — E876 Hypokalemia: Secondary | ICD-10-CM | POA: Diagnosis not present

## 2018-05-23 DIAGNOSIS — J439 Emphysema, unspecified: Secondary | ICD-10-CM

## 2018-05-23 DIAGNOSIS — Z888 Allergy status to other drugs, medicaments and biological substances status: Secondary | ICD-10-CM | POA: Insufficient documentation

## 2018-05-23 DIAGNOSIS — Z886 Allergy status to analgesic agent status: Secondary | ICD-10-CM | POA: Insufficient documentation

## 2018-05-23 DIAGNOSIS — Z833 Family history of diabetes mellitus: Secondary | ICD-10-CM | POA: Insufficient documentation

## 2018-05-23 DIAGNOSIS — Z840 Family history of diseases of the skin and subcutaneous tissue: Secondary | ICD-10-CM | POA: Insufficient documentation

## 2018-05-23 DIAGNOSIS — G40009 Localization-related (focal) (partial) idiopathic epilepsy and epileptic syndromes with seizures of localized onset, not intractable, without status epilepticus: Secondary | ICD-10-CM

## 2018-05-23 DIAGNOSIS — M1990 Unspecified osteoarthritis, unspecified site: Secondary | ICD-10-CM | POA: Insufficient documentation

## 2018-05-23 LAB — LIPASE, BLOOD: Lipase: 38 U/L (ref 11–51)

## 2018-05-23 LAB — BASIC METABOLIC PANEL
ANION GAP: 10 (ref 5–15)
BUN: 9 mg/dL (ref 6–20)
CO2: 26 mmol/L (ref 22–32)
Calcium: 9.5 mg/dL (ref 8.9–10.3)
Chloride: 100 mmol/L — ABNORMAL LOW (ref 101–111)
Creatinine, Ser: 1.22 mg/dL — ABNORMAL HIGH (ref 0.44–1.00)
GFR calc Af Amer: 55 mL/min — ABNORMAL LOW (ref >60)
GFR, EST NON AFRICAN AMERICAN: 48 mL/min — AB (ref >60)
Glucose, Bld: 119 mg/dL — ABNORMAL HIGH (ref 65–99)
POTASSIUM: 2.9 mmol/L — AB (ref 3.5–5.1)
SODIUM: 136 mmol/L (ref 135–145)

## 2018-05-23 LAB — CBC WITH DIFFERENTIAL/PLATELET
Basophils Absolute: 0 10*3/uL (ref 0.0–0.1)
Basophils Relative: 0 %
EOS ABS: 0 10*3/uL (ref 0.0–0.7)
EOS PCT: 1 %
HCT: 31.7 % — ABNORMAL LOW (ref 36.0–46.0)
Hemoglobin: 10.2 g/dL — ABNORMAL LOW (ref 12.0–15.0)
Lymphocytes Relative: 37 %
Lymphs Abs: 2 10*3/uL (ref 0.7–4.0)
MCH: 28.8 pg (ref 26.0–34.0)
MCHC: 32.2 g/dL (ref 30.0–36.0)
MCV: 89.5 fL (ref 78.0–100.0)
Monocytes Absolute: 0.4 10*3/uL (ref 0.1–1.0)
Monocytes Relative: 8 %
Neutro Abs: 3 10*3/uL (ref 1.7–7.7)
Neutrophils Relative %: 54 %
PLATELETS: 205 10*3/uL (ref 150–400)
RBC: 3.54 MIL/uL — AB (ref 3.87–5.11)
RDW: 16.2 % — AB (ref 11.5–15.5)
WBC: 5.5 10*3/uL (ref 4.0–10.5)

## 2018-05-23 LAB — MAGNESIUM: Magnesium: 1.4 mg/dL — ABNORMAL LOW (ref 1.7–2.4)

## 2018-05-23 LAB — HEPATIC FUNCTION PANEL
ALT: 40 U/L (ref 14–54)
AST: 60 U/L — ABNORMAL HIGH (ref 15–41)
Albumin: 4.3 g/dL (ref 3.5–5.0)
Alkaline Phosphatase: 65 U/L (ref 38–126)
Bilirubin, Direct: 0.1 mg/dL (ref 0.1–0.5)
Indirect Bilirubin: 0.2 mg/dL — ABNORMAL LOW (ref 0.3–0.9)
Total Bilirubin: 0.3 mg/dL (ref 0.3–1.2)
Total Protein: 8.1 g/dL (ref 6.5–8.1)

## 2018-05-23 LAB — RAPID URINE DRUG SCREEN, HOSP PERFORMED
AMPHETAMINES: NOT DETECTED
BENZODIAZEPINES: NOT DETECTED
Barbiturates: NOT DETECTED
Cocaine: POSITIVE — AB
Opiates: NOT DETECTED
TETRAHYDROCANNABINOL: NOT DETECTED

## 2018-05-23 LAB — ETHANOL

## 2018-05-23 LAB — ACETAMINOPHEN LEVEL: ACETAMINOPHEN (TYLENOL), SERUM: 269 ug/mL — AB (ref 10–30)

## 2018-05-23 LAB — I-STAT TROPONIN, ED: Troponin i, poc: 0 ng/mL (ref 0.00–0.08)

## 2018-05-23 LAB — SALICYLATE LEVEL: Salicylate Lvl: <7 mg/dL (ref 2.8–30.0)

## 2018-05-23 LAB — PROTIME-INR
INR: 1.03
Prothrombin Time: 13.5 seconds (ref 11.4–15.2)

## 2018-05-23 MED ORDER — POTASSIUM CHLORIDE 10 MEQ/100ML IV SOLN
10.0000 meq | Freq: Once | INTRAVENOUS | Status: AC
  Administered 2018-05-23: 10 meq via INTRAVENOUS
  Filled 2018-05-23: qty 100

## 2018-05-23 MED ORDER — POTASSIUM CHLORIDE 10 MEQ/100ML IV SOLN
INTRAVENOUS | Status: AC
  Filled 2018-05-23: qty 100

## 2018-05-23 MED ORDER — NICOTINE 14 MG/24HR TD PT24
14.0000 mg | MEDICATED_PATCH | Freq: Every day | TRANSDERMAL | Status: DC
  Administered 2018-05-23 – 2018-05-24 (×2): 14 mg via TRANSDERMAL
  Filled 2018-05-23 (×2): qty 1

## 2018-05-23 MED ORDER — SODIUM CHLORIDE 0.9 % IV SOLN
INTRAVENOUS | Status: DC
  Administered 2018-05-23 – 2018-05-24 (×3): via INTRAVENOUS

## 2018-05-23 MED ORDER — MOMETASONE FURO-FORMOTEROL FUM 100-5 MCG/ACT IN AERO
2.0000 | INHALATION_SPRAY | Freq: Two times a day (BID) | RESPIRATORY_TRACT | Status: DC
  Administered 2018-05-23 – 2018-05-24 (×2): 2 via RESPIRATORY_TRACT
  Filled 2018-05-23: qty 8.8

## 2018-05-23 MED ORDER — GABAPENTIN 300 MG PO CAPS
300.0000 mg | ORAL_CAPSULE | Freq: Two times a day (BID) | ORAL | Status: DC
  Administered 2018-05-23 – 2018-05-24 (×3): 300 mg via ORAL
  Filled 2018-05-23 (×3): qty 1

## 2018-05-23 MED ORDER — ONDANSETRON HCL 4 MG/2ML IJ SOLN
4.0000 mg | Freq: Four times a day (QID) | INTRAMUSCULAR | Status: DC | PRN
  Administered 2018-05-23: 4 mg via INTRAVENOUS
  Filled 2018-05-23: qty 2

## 2018-05-23 MED ORDER — ACETYLCYSTEINE LOAD VIA INFUSION
150.0000 mg/kg | Freq: Once | INTRAVENOUS | Status: AC
  Administered 2018-05-23: 7695 mg via INTRAVENOUS
  Filled 2018-05-23: qty 193

## 2018-05-23 MED ORDER — MORPHINE SULFATE (PF) 2 MG/ML IV SOLN
2.0000 mg | INTRAVENOUS | Status: DC | PRN
  Administered 2018-05-23 – 2018-05-24 (×5): 2 mg via INTRAVENOUS
  Filled 2018-05-23 (×6): qty 1

## 2018-05-23 MED ORDER — POTASSIUM CHLORIDE 10 MEQ/100ML IV SOLN
INTRAVENOUS | Status: AC
  Administered 2018-05-23: 10 meq via INTRAVENOUS
  Filled 2018-05-23: qty 100

## 2018-05-23 MED ORDER — DEXTROSE 5 % IV SOLN
15.0000 mg/kg/h | Freq: Once | INTRAVENOUS | Status: DC
  Filled 2018-05-23: qty 150

## 2018-05-23 MED ORDER — CITALOPRAM HYDROBROMIDE 20 MG PO TABS
10.0000 mg | ORAL_TABLET | Freq: Every day | ORAL | Status: DC
  Administered 2018-05-23 – 2018-05-24 (×2): 10 mg via ORAL
  Filled 2018-05-23 (×2): qty 1

## 2018-05-23 MED ORDER — PANTOPRAZOLE SODIUM 40 MG PO TBEC
40.0000 mg | DELAYED_RELEASE_TABLET | Freq: Every day | ORAL | Status: DC
  Administered 2018-05-23 – 2018-05-24 (×2): 40 mg via ORAL
  Filled 2018-05-23 (×2): qty 1

## 2018-05-23 MED ORDER — DEXTROSE 5 % IV SOLN
15.0000 mg/kg/h | Freq: Once | INTRAVENOUS | Status: AC
  Administered 2018-05-23: 15 mg/kg/h via INTRAVENOUS
  Filled 2018-05-23: qty 50

## 2018-05-23 MED ORDER — POTASSIUM CHLORIDE 10 MEQ/100ML IV SOLN
10.0000 meq | INTRAVENOUS | Status: AC
  Administered 2018-05-23 (×5): 10 meq via INTRAVENOUS
  Filled 2018-05-23 (×3): qty 100

## 2018-05-23 MED ORDER — ALBUTEROL SULFATE HFA 108 (90 BASE) MCG/ACT IN AERS: 2.0000 | INHALATION_SPRAY | RESPIRATORY_TRACT | Status: DC | PRN

## 2018-05-23 MED ORDER — DEXTROSE 5 % IV SOLN
15.0000 mg/kg/h | INTRAVENOUS | Status: DC
  Administered 2018-05-23: 15 mg/kg/h via INTRAVENOUS
  Filled 2018-05-23: qty 120

## 2018-05-23 MED ORDER — IPRATROPIUM-ALBUTEROL 0.5-2.5 (3) MG/3ML IN SOLN: 3.0000 mL | RESPIRATORY_TRACT | Status: DC | PRN

## 2018-05-23 MED ORDER — QUETIAPINE FUMARATE 100 MG PO TABS
300.0000 mg | ORAL_TABLET | Freq: Every day | ORAL | Status: DC
  Administered 2018-05-23: 300 mg via ORAL
  Filled 2018-05-23: qty 3

## 2018-05-23 MED ORDER — FOLIC ACID 1 MG PO TABS
1.0000 mg | ORAL_TABLET | Freq: Every day | ORAL | Status: DC
  Administered 2018-05-23 – 2018-05-24 (×2): 1 mg via ORAL
  Filled 2018-05-23 (×3): qty 1

## 2018-05-23 MED ORDER — MAGNESIUM SULFATE 2 GM/50ML IV SOLN
2.0000 g | Freq: Once | INTRAVENOUS | Status: AC
  Administered 2018-05-23: 2 g via INTRAVENOUS
  Filled 2018-05-23: qty 50

## 2018-05-23 MED ORDER — POTASSIUM CHLORIDE CRYS ER 10 MEQ PO TBCR
40.0000 meq | EXTENDED_RELEASE_TABLET | Freq: Once | ORAL | Status: AC
  Administered 2018-05-23: 40 meq via ORAL
  Filled 2018-05-23: qty 4

## 2018-05-23 MED ORDER — AMLODIPINE BESYLATE 5 MG PO TABS
5.0000 mg | ORAL_TABLET | Freq: Every day | ORAL | Status: DC
  Administered 2018-05-23 – 2018-05-24 (×2): 5 mg via ORAL
  Filled 2018-05-23 (×2): qty 1

## 2018-05-23 MED ORDER — FERROUS SULFATE 325 (65 FE) MG PO TABS
325.0000 mg | ORAL_TABLET | Freq: Every day | ORAL | Status: DC
  Administered 2018-05-24: 325 mg via ORAL
  Filled 2018-05-23: qty 1

## 2018-05-23 MED ORDER — VITAMIN B-1 100 MG PO TABS
100.0000 mg | ORAL_TABLET | Freq: Every day | ORAL | Status: DC
  Administered 2018-05-23 – 2018-05-24 (×2): 100 mg via ORAL
  Filled 2018-05-23 (×2): qty 1

## 2018-05-23 NOTE — ED Notes (Signed)
Date and time results received: 05/23/18 0641 (use smartphrase ".now" to insert current time)  Test: Acetaminophen Critical Value: 269  Name of Provider Notified: Dr.Horton  Orders Received? Or Actions Taken?: Continue Acetadote administration

## 2018-05-23 NOTE — ED Notes (Signed)
Rich from [employer] called for update, recommendations:  Acetylcysteine drip x 24 hrs. Recollect tylenol level and liver fxn at 23rd hour. Optimize potassium and mag. And repeat EKG in 4-6 hrs.

## 2018-05-23 NOTE — ED Notes (Signed)
Patient transported to CT 

## 2018-05-23 NOTE — Progress Notes (Signed)
MEDICATION RELATED CONSULT NOTE - FOLLOW UP   Pharmacy Consult for acetylcysteine IV Indication: acetaminophen ingestion   Allergies  Allergen Reactions  . Fruit & Vegetable Daily [Nutritional Supplements] Shortness Of Breath    Aloe  . Iodinated Diagnostic Agents Swelling    Swelling and itching of left side of her face only after CT SI injection(no steroid used)  . Ibuprofen Other (See Comments)    Stomach upset; However, pt can take Meloxicam without incident and takes this at home.   . Aspirin Rash and Other (See Comments)    Stomach upset    Patient Measurements: Height: 5\' 3"  (160 cm) Weight: 113 lb (51.3 kg) IBW/kg (Calculated) : 52.4 Adjusted Body Weight:   Vital Signs: BP: 177/91 (06/11 0846) Pulse Rate: 80 (06/11 0846) Intake/Output from previous day: 06/10 0701 - 06/11 0700 In: 2.6 [I.V.:2.6] Out: -  Intake/Output from this shift: No intake/output data recorded.  Labs: Recent Labs    05/23/18 0530  WBC 5.5  HGB 10.2*  HCT 31.7*  PLT 205  CREATININE 1.22*  ALBUMIN 4.3  PROT 8.1  AST 60*  ALT 40  ALKPHOS 65  BILITOT 0.3  BILIDIR 0.1  IBILI 0.2*   Estimated Creatinine Clearance: 40.2 mL/min (A) (by C-G formula based on SCr of 1.22 mg/dL (H)).   Microbiology: No results found for this or any previous visit (from the past 720 hour(s)).  Assessment: 89 YOF with acetaminophen overdose.  APAP level = 269.  Pharmacy asked to monitor IV acetylcysteine.  Bolus given in ED, no gtt.    AST = 60, ALT = 40 INR = 1.03 - contacted poison control, protocol states up to provider if wish to continue NAC but suggested continue NAC treatment due to degree APAP elevated.  Goal of Therapy:  Prevent hepatotoxicity  Plan:   IV acetylcysteine 15mg /kg/hr (LD already given)  Check LFTs, APAP level and INR in am (~22h from start of infusion, bolus given at ~7am)  Follow up with poison control 6/12  Doreene Eland, PharmD, BCPS.   Pager: [phone] 05/23/2018  9:54 AM

## 2018-05-23 NOTE — H&P (Signed)
History and Physical    Melissa Bridges SEG:[id number] DOB: Apr 15, 1961 DOA: 05/23/2018  PCP: Wardell Honour, MD   Patient coming from: Home  Chief Complaint: Abdominal pain, Tylenol overdose  HPI: Melissa Bridges is a 57 y.o. female with medical history significant of hypertension, seizure affective disorder, hepatitis C, COPD, seizure disorder, nicotine abuse, alcohol abuse chronic back pain who presented to the emergency department with complaints of abdominal pain. Patient reported that she has chronic back pain and she took 20 pills of 500 mg Tylenol to relieve the pain.  After that she started having abdominal pain and she presented to the emergency department. Upon presentation, Tylenol level was found to be elevated at 269.  Poison control was contacted.  Started on NAC protocol . Patient also reported of nausea and vomiting at home along with abdominal pain.  When she presented to the emergency department she was having abdominal pain in the left lower quadrant not on the right upper quadrant. Patient seen and examined the bedside in the emergency department.  Currently she appears comfortable but  still complains of pain.  Noted to be mildly hypertensive.  Her liver enzymes are not that significantly elevated.  She denies any chest pain, shortness of breath, repetitions, dysuria, headache, fever chills.  ED Course: Started on NAC protocol.  Review of Systems: As per HPI otherwise 10 point review of systems negative.    Past Medical History:  Diagnosis Date  . Allergy   . Alopecia   . Anxiety   . Arthritis   . Asthma   . Cataract    "mild"  . COPD (chronic obstructive pulmonary disease) ([place])   . Depression   . GERD (gastroesophageal reflux disease)    o cc- takes OTC if needed.  . Glaucoma   . Hepatitis C   . Hypertension    onset age 47.  . Iron deficiency anemia   . Migraine   . Schizoaffective disorder ([place])   . Sciatic pain   . Seizures ([place])    onset in  childhood. 06-03-16- per pt 8 months agoGeneralized tonic-clonic.Last seizure couple of months ago- last sz 9-10 months ago per pt 10-12-2016  . Shortness of breath dyspnea   . Substance abuse ([place])   . Ulcer     Past Surgical History:  Procedure Laterality Date  . ABDOMINAL HYSTERECTOMY     ovarian cyst B, cervical dysplasia, fibroids.   Ovaries intact.  . COLONOSCOPY    . COLONOSCOPY W/ BIOPSIES    . DILATION AND CURETTAGE OF UTERUS     x2  . EXPLORATORY LAPAROTOMY     x3  . GYNECOLOGIC CRYOSURGERY     x3  . MANDIBLE RECONSTRUCTION N/A 06/27/2015   Procedure: REMOVAL MAXILLARY PALATAL TORUS.  REMOVAL MANDIBULAR TORUS AND EXOSTOSIS.  ;  Surgeon: Diona Browner, DDS;  Location: [facility];  Service: Oral Surgery;  Laterality: N/A;  . OVARIAN CYST REMOVAL  2008  . POLYPECTOMY       reports that she has been smoking cigarettes.  She has a 14.52 pack-year smoking history. She has never used smokeless tobacco. She reports that she drinks alcohol. She reports that she does not use drugs.  Allergies  Allergen Reactions  . Fruit & Vegetable Daily [Nutritional Supplements] Shortness Of Breath    Aloe  . Iodinated Diagnostic Agents Swelling    Swelling and itching of left side of her face only after CT SI injection(no steroid used)  . Ibuprofen Other (See Comments)  Stomach upset; However, pt can take Meloxicam without incident and takes this at home.   . Aspirin Rash and Other (See Comments)    Stomach upset    Family History  Problem Relation Age of Onset  . Diabetes type II Mother   . Hypertension Mother   . Arthritis Mother   . Diabetes Mother   . Mental illness Mother        bipolar  . Diabetes type II Maternal Aunt   . Hypertension Father   . Heart murmur Father   . Arthritis Father   . Stroke Father   . Alopecia Sister   . Alopecia Brother   . Alopecia Sister   . Mental retardation Sister        depression  . Prostate cancer Paternal Grandfather   . Cancer Maternal  Uncle   . Colon cancer Neg Hx   . Esophageal cancer Neg Hx   . Rectal cancer Neg Hx   . Stomach cancer Neg Hx   . Colon polyps Neg Hx      Prior to Admission medications   Medication Sig Start Date End Date Taking? Authorizing Provider  albuterol (VENTOLIN HFA) 108 (90 Base) MCG/ACT inhaler INHALE TWO PUFFS BY MOUTH EVERY 6 HOURS AS NEEDED FOR SHORTNESS OF BREATH 07/29/17  Yes Wardell Honour, MD  amLODipine (NORVASC) 5 MG tablet Take 1 tablet (5 mg total) by mouth daily. 07/29/17  Yes Wardell Honour, MD  budesonide-formoterol [facility]) 80-4.5 MCG/ACT inhaler Inhale 2 puffs into the lungs 2 (two) times daily. 09/02/17  Yes Wardell Honour, MD  citalopram (CELEXA) 10 MG tablet Take 1 tablet (10 mg total) by mouth daily. For depression 07/28/17  Yes Arfeen, Arlyce Harman, MD  ferrous sulfate 325 (65 FE) MG tablet Take 1 tablet (325 mg total) by mouth daily with breakfast. 09/04/16  Yes Wardell Honour, MD  gabapentin (NEURONTIN) 300 MG capsule Take 1 capsule (300 mg total) by mouth 2 (two) times daily. Start with one at night initially. 04/10/18  Yes Wendie Agreste, MD  ipratropium-albuterol (DUONEB) 0.5-2.5 (3) MG/3ML SOLN Take 3 mLs by nebulization every 4 (four) hours as needed (shortness of breath). Reported on 12/02/2015   Yes [provider]  LORazepam (ATIVAN) 1 MG tablet Take 1 mg by mouth daily as needed for anxiety.   Yes [provider]  methocarbamol (ROBAXIN) 500 MG tablet Take 1-2 tablets (500-1,000 mg total) by mouth every 8 (eight) hours as needed for muscle spasms. 02/04/18  Yes Molpus, John, MD  QUEtiapine (SEROQUEL) 300 MG tablet Take 1 tablet (300 mg total) by mouth at bedtime. Mood control 07/28/17  Yes Arfeen, Arlyce Harman, MD  valACYclovir (VALTREX) 1000 MG tablet Take 2 tabs po at symptom onset, repeat in 12 hours Patient taking differently: Take 2,000 mg by mouth daily as needed (flare ups).  07/29/17  Yes Wardell Honour, MD  cyclobenzaprine (FLEXERIL) 5 MG tablet 1  pill by mouth up to every 8 hours as needed. Patient not taking: Reported on 05/23/2018 04/10/18   Wendie Agreste, MD  fluticasone-salmeterol (ADVAIR [facility]) [phone] MCG/ACT inhaler Inhale 2 puffs into the lungs 2 (two) times daily. Patient not taking: Reported on 05/23/2018 07/29/17   Wardell Honour, MD  LamoTRIgine (LAMICTAL XR) 25 MG TB24 tablet take 1 tablet daily for 2 weeks, then increase to 2 tablets daily for 2 weeks, then increase to 4 tablets daily then see me in follow-up Patient not taking: Reported on  05/23/2018 12/02/15   Cameron Sprang, MD  metroNIDAZOLE (FLAGYL) 500 MG tablet Take 1 tablet (500 mg total) by mouth 2 (two) times daily. Patient not taking: Reported on 05/23/2018 09/28/17   Montine Circle, PA-C  triamcinolone cream (KENALOG) 0.1 % APPLY  CREAM EXTERNALLY TWICE DAILY Patient not taking: Reported on 05/23/2018 09/02/17   Wardell Honour, MD    Physical Exam: Vitals:   05/23/18 0600 05/23/18 0630 05/23/18 0640 05/23/18 0846  BP: (!) 151/85 138/65 114/66 (!) 177/91  Pulse: 80 69 65 80  Resp: 13 19 14  (!) 21  SpO2: 100% 100% 95% 100%  Weight:      Height:        Constitutional: NAD, calm, comfortable,thin  Vitals:   05/23/18 0600 05/23/18 0630 05/23/18 0640 05/23/18 0846  BP: (!) 151/85 138/65 114/66 (!) 177/91  Pulse: 80 69 65 80  Resp: 13 19 14  (!) 21  SpO2: 100% 100% 95% 100%  Weight:      Height:       Eyes: PERRL, lids and conjunctivae normal.  Right sided periorbital redness ENMT: Mucous membranes are moist. Posterior pharynx clear of any exudate or lesions.Normal dentition.  Neck: normal, supple, no masses, no thyromegaly Respiratory: clear to auscultation bilaterally, no wheezing, no crackles. Normal respiratory effort. No accessory muscle use.  Cardiovascular: Regular rate and rhythm, no murmurs / rubs / gallops. No extremity edema. 2+ pedal pulses. No carotid bruits.  Abdomen: Tenderness on left upper and lower quadrant, no masses palpated. No  hepatosplenomegaly. Bowel sounds positive.  Musculoskeletal: no clubbing / cyanosis. No joint deformity upper and lower extremities. Good ROM, no contractures. Normal muscle tone.  Skin: no rashes, lesions, ulcers. No induration Neurologic: CN 2-12 grossly intact. Sensation intact, DTR normal. Strength 5/5 in all 4.  Psychiatric: Normal judgment and insight. Alert and oriented x 3. Normal mood.   Foley Catheter:None  Labs on Admission: I have personally reviewed following labs and imaging studies  CBC: Recent Labs  Lab 05/23/18 0530  WBC 5.5  NEUTROABS 3.0  HGB 10.2*  HCT 31.7*  MCV 89.5  PLT 956   Basic Metabolic Panel: Recent Labs  Lab 05/23/18 0530  NA 136  K 2.9*  CL 100*  CO2 26  GLUCOSE 119*  BUN 9  CREATININE 1.22*  CALCIUM 9.5   GFR: Estimated Creatinine Clearance: 40.2 mL/min (A) (by C-G formula based on SCr of 1.22 mg/dL (H)). Liver Function Tests: Recent Labs  Lab 05/23/18 0530  AST 60*  ALT 40  ALKPHOS 65  BILITOT 0.3  PROT 8.1  ALBUMIN 4.3   Recent Labs  Lab 05/23/18 0530  LIPASE 38   No results for input(s): AMMONIA in the last 168 hours. Coagulation Profile: Recent Labs  Lab 05/23/18 0530  INR 1.03   Cardiac Enzymes: No results for input(s): CKTOTAL, CKMB, CKMBINDEX, TROPONINI in the last 168 hours. BNP (last 3 results) No results for input(s): PROBNP in the last 8760 hours. HbA1C: No results for input(s): HGBA1C in the last 72 hours. CBG: No results for input(s): GLUCAP in the last 168 hours. Lipid Profile: No results for input(s): CHOL, HDL, LDLCALC, TRIG, CHOLHDL, LDLDIRECT in the last 72 hours. Thyroid Function Tests: No results for input(s): TSH, T4TOTAL, FREET4, T3FREE, THYROIDAB in the last 72 hours. Anemia Panel: No results for input(s): VITAMINB12, FOLATE, FERRITIN, TIBC, IRON, RETICCTPCT in the last 72 hours. Urine analysis:    Component Value Date/Time   COLORURINE YELLOW 02/04/2018 0045   APPEARANCEUR  CLEAR  02/04/2018 0045   LABSPEC <1.005 (L) 02/04/2018 0045   PHURINE 5.5 02/04/2018 0045   GLUCOSEU NEGATIVE 02/04/2018 0045   HGBUR TRACE (A) 02/04/2018 0045   BILIRUBINUR negative 04/10/2018 1209   BILIRUBINUR neg 06/10/2015 1154   KETONESUR negative 04/10/2018 [place] 02/04/2018 0045   PROTEINUR negative 04/10/2018 1209   PROTEINUR NEGATIVE 02/04/2018 0045   UROBILINOGEN 0.2 04/10/2018 1209   UROBILINOGEN 0.2 08/25/2015 2049   NITRITE Negative 04/10/2018 1209   NITRITE NEGATIVE 02/04/2018 0045   LEUKOCYTESUR Negative 04/10/2018 1209    Radiological Exams on Admission: No results found.   Assessment/Plan Principal Problem:   Tylenol overdose Active Problems:   Substance abuse (HCC)   Schizoaffective disorder (HCC)   Chronic hepatitis C without hepatic coma (HCC)   Hypertension   Lower back pain   Alcohol abuse   Seizure disorder ([place])   Hypokalemia   AKI (acute kidney injury) ([place])   Abdominal pain   COPD (chronic obstructive pulmonary disease) (HCC)  Tylenol overdose: Took 20 pills of 500 milligrams Tylenol for back pain.  Complained of abdominal pain but not on right upper quadrant.  Poison control contacted.  Started on NAC protocol. We will continue to monitor the liver enzymes.  Not significantly elevated on presentation with just mild elevation of AST which could be from chronic alcohol abuse.  Abdominal pain: Complains of left-sided abdominal pain instead of right.  Will get CT abdomen/pelvis.  Pain management.  History of hypertension: We will continue to monitor her blood pressure.  Will resume her home meds.  History of schizoaffective disorder: We will continue her psych medications.  She has not seen her psychiatrist for a while.  She needs to follow-up with psychiatry as an outpatient.  Hypokalemia: Could be associated with vomiting or chronic alcohol abuse.  Will check magnesium level too.  We will continue to check the level and supplement  as necessary.  Low back pain: Continue supportive care and pain management.  Will avoid Tylenol-containing drugs at the moment.  History of COPD/smoker: Currently stable.  No wheezes.  We will continue her home inhalers.  Has been smoking 1 pack a day since last several years.  Will continue nicotine patch here.  Counseled for smoking cessation.  Chronic alcohol abuse: Drinks 40 ounce beer daily more than 1 a day.  Counseled for alcohol cessation.  AST mildly elevated.  Will start on thiamine and folic acid.  Low threshold for starting on CIWA protocol.  But currently she is not in withdrawal.  History of hepatitis C: History of IV drug abuse in the past but denies any at the moment.  Cocaine positive in UDS.  Needs to follow-up with ID/hepatology as an outpatient for the management of her hepatitis C as she has not been treated for that.  History of seizure disorder: Was on lamotrigine previously but not now.  She needs to follow-up with neurology as an outpatient.  No recent seizure episodes.  Acute kidney injury: We will start on gentle IV fluids.  We will continue to monitor her kidney function.  Fall: Patient reported a fall at home.She has right-sided periorbital swelling and redness.not exactly sure about the etiology of fall .Could also be associated with alcohol .Will get CT of the head.We will ambulate her .If needed we will request for  physical therapy evaluation.    Severity of Illness: The appropriate patient status for this patient is OBSERVATION.    DVT prophylaxis:SCD Code Status:  Full Family Communication: None present at the bedside Consults called: None     Shelly Coss MD [facility] Pager [phone]  If 7PM-7AM, please contact night-coverage [email or web address] Password [facility]  05/23/2018, 9:19 AM

## 2018-05-23 NOTE — ED Notes (Signed)
ED TO INPATIENT HANDOFF REPORT  Name/Age/Gender Melissa Bridges 57 y.o. female  Code Status    Code Status Orders  (From admission, onward)        Start     Ordered   05/23/18 0918  Full code  Continuous     05/23/18 0917    Code Status History    Date Active Date Inactive Code Status Order ID Comments User Context   09/06/2014 1817 09/09/2014 2053 Full Code [id number]  Waylan Boga, NP Inpatient   09/05/2014 1903 09/06/2014 1817 Full Code [id number]  Malvin Johns, MD ED   11/11/2011 0432 11/11/2011 1637 Full Code [id number]  Kalman Drape, MD ED      Home/SNF/Other Home  Chief Complaint Ingestion  Level of Care/Admitting Diagnosis ED Disposition    ED Disposition Condition [facility] Area: [facility] [[id number]]  Level of Care: Med-Surg [16]  Diagnosis: Tylenol overdose [158309]  Admitting Physician: Shelly Coss [[id number]]  Attending Physician: Shelly Coss [[id number]]  PT Class (Do Not Modify): Observation [104]  PT Acc Code (Do Not Modify): Observation [10022]       Medical History Past Medical History:  Diagnosis Date  . Allergy   . Alopecia   . Anxiety   . Arthritis   . Asthma   . Cataract    "mild"  . COPD (chronic obstructive pulmonary disease) ([place])   . Depression   . GERD (gastroesophageal reflux disease)    o cc- takes OTC if needed.  . Glaucoma   . Hepatitis C   . Hypertension    onset age 63.  . Iron deficiency anemia   . Migraine   . Schizoaffective disorder ([place])   . Sciatic pain   . Seizures ([place])    onset in childhood. 06-03-16- per pt 8 months agoGeneralized tonic-clonic.Last seizure couple of months ago- last sz 9-10 months ago per pt 10-12-2016  . Shortness of breath dyspnea   . Substance abuse ([place])   . Ulcer     Allergies Allergies  Allergen Reactions  . Fruit & Vegetable Daily [Nutritional Supplements] Shortness Of Breath    Aloe  . Iodinated Diagnostic Agents Swelling    Swelling and  itching of left side of her face only after CT SI injection(no steroid used)  . Ibuprofen Other (See Comments)    Stomach upset; However, pt can take Meloxicam without incident and takes this at home.   . Aspirin Rash and Other (See Comments)    Stomach upset    IV Location/Drains/Wounds Patient Lines/Drains/Airways Status   Active Line/Drains/Airways    Name:   Placement date:   Placement time:   Site:   Days:   Peripheral IV 05/23/18 Left Forearm   05/23/18    0949    Forearm   less than 1   Peripheral IV 05/23/18 Right Forearm   05/23/18    1053    Forearm   less than 1   Airway 7 mm   06/27/15    1033     1061          Labs/Imaging Results for orders placed or performed during the hospital encounter of 05/23/18 (from the past 48 hour(s))  CBC with Differential     Status: Abnormal   Collection Time: 05/23/18  5:30 AM  Result Value Ref Range   WBC 5.5 4.0 - 10.5 K/uL   RBC 3.54 (L) 3.87 - 5.11 MIL/uL   Hemoglobin 10.2 (L) 12.0 -  15.0 g/dL   HCT 31.7 (L) 36.0 - 46.0 %   MCV 89.5 78.0 - 100.0 fL   MCH 28.8 26.0 - 34.0 pg   MCHC 32.2 30.0 - 36.0 g/dL   RDW 16.2 (H) 11.5 - 15.5 %   Platelets 205 150 - 400 K/uL   Neutrophils Relative % 54 %   Neutro Abs 3.0 1.7 - 7.7 K/uL   Lymphocytes Relative 37 %   Lymphs Abs 2.0 0.7 - 4.0 K/uL   Monocytes Relative 8 %   Monocytes Absolute 0.4 0.1 - 1.0 K/uL   Eosinophils Relative 1 %   Eosinophils Absolute 0.0 0.0 - 0.7 K/uL   Basophils Relative 0 %   Basophils Absolute 0.0 0.0 - 0.1 K/uL    Comment: Performed at [facility], [place] [address] [place], [place] [address]  Basic metabolic panel     Status: Abnormal   Collection Time: 05/23/18  5:30 AM  Result Value Ref Range   Sodium 136 135 - 145 mmol/L   Potassium 2.9 (L) 3.5 - 5.1 mmol/L   Chloride 100 (L) 101 - 111 mmol/L   CO2 26 22 - 32 mmol/L   Glucose, Bld 119 (H) 65 - 99 mg/dL   BUN 9 6 - 20 mg/dL   Creatinine, Ser 1.22 (H) 0.44 - 1.00 mg/dL   Calcium  9.5 8.9 - 10.3 mg/dL   GFR calc non Af Amer 48 (L) >60 mL/min   GFR calc Af Amer 55 (L) >60 mL/min    Comment: (NOTE) The eGFR has been calculated using the CKD EPI equation. This calculation has not been validated in all clinical situations. eGFR's persistently <60 mL/min signify possible Chronic Kidney Disease.    Anion gap 10 5 - 15    Comment: Performed at [facility], [place] [address], [place], [place] [address]  Ethanol     Status: None   Collection Time: 05/23/18  5:30 AM  Result Value Ref Range   Alcohol, Ethyl (B) <10 <10 mg/dL    Comment: (NOTE) Lowest detectable limit for serum alcohol is 10 mg/dL. For medical purposes only. Performed at [facility], [place] [address], [place], [place] [address]   Salicylate level     Status: None   Collection Time: 05/23/18  5:30 AM  Result Value Ref Range   Salicylate Lvl <6.3 2.8 - 30.0 mg/dL    Comment: Performed at [facility], [place] [address], [place], [place] [address]  Acetaminophen level     Status: Abnormal   Collection Time: 05/23/18  5:30 AM  Result Value Ref Range   Acetaminophen (Tylenol), Serum 269 (HH) 10 - 30 ug/mL    Comment: CRITICAL RESULT CALLED TO, READ BACK BY AND VERIFIED WITH: COGGIN,C. RN AT 1657 05/23/18 MULLINS,T (NOTE) Therapeutic concentrations vary significantly. A range of 10-30 ug/mL  may be an effective concentration for many patients. However, some  are best treated at concentrations outside of this range. Acetaminophen concentrations >150 ug/mL at 4 hours after ingestion  and >50 ug/mL at 12 hours after ingestion are often associated with  toxic reactions. Performed at [facility], [place] [address], [place], [place] [address]   Protime-INR     Status: None   Collection Time: 05/23/18  5:30 AM  Result Value Ref Range   Prothrombin Time 13.5 11.4 - 15.2 seconds   INR 1.03     Comment: Performed at [facility], [place]  [address], [place], [place] [address]  Hepatic function panel     Status: Abnormal   Collection Time: 05/23/18  5:30 AM  Result Value Ref Range   Total Protein 8.1 6.5 - 8.1 g/dL   Albumin 4.3 3.5 - 5.0 g/dL   AST 60 (H) 15 - 41 U/L   ALT 40 14 - 54 U/L   Alkaline Phosphatase 65 38 - 126 U/L   Total Bilirubin 0.3 0.3 - 1.2 mg/dL   Bilirubin, Direct 0.1 0.1 - 0.5 mg/dL   Indirect Bilirubin 0.2 (L) 0.3 - 0.9 mg/dL    Comment: Performed at [facility], [place] [address] [place], [place] [address]  Lipase, blood     Status: None   Collection Time: 05/23/18  5:30 AM  Result Value Ref Range   Lipase 38 11 - 51 U/L    Comment: Performed at [facility], [place] [address] [place], [place] [address]  Magnesium     Status: Abnormal   Collection Time: 05/23/18  5:30 AM  Result Value Ref Range   Magnesium 1.4 (L) 1.7 - 2.4 mg/dL    Comment: Performed at [facility], [place] [address] [place], [place] [address]  I-Stat Troponin, ED (not at [facility])     Status: None   Collection Time: 05/23/18  5:36 AM  Result Value Ref Range   Troponin i, poc 0.00 0.00 - 0.08 ng/mL   Comment 3            Comment: Due to the release kinetics of cTnI, a negative result within the first hours of the onset of symptoms does not rule out myocardial infarction with certainty. If myocardial infarction is still suspected, repeat the test at appropriate intervals.   Rapid urine drug screen (hospital performed)     Status: Abnormal   Collection Time: 05/23/18  6:56 AM  Result Value Ref Range   Opiates NONE DETECTED NONE DETECTED   Cocaine POSITIVE (A) NONE DETECTED   Benzodiazepines NONE DETECTED NONE DETECTED   Amphetamines NONE DETECTED NONE DETECTED   Tetrahydrocannabinol NONE DETECTED NONE DETECTED   Barbiturates NONE DETECTED NONE DETECTED    Comment: (NOTE) DRUG SCREEN FOR MEDICAL PURPOSES ONLY.  IF CONFIRMATION IS NEEDED FOR ANY PURPOSE,  NOTIFY LAB WITHIN 5 DAYS. LOWEST DETECTABLE LIMITS FOR URINE DRUG SCREEN Drug Class                     Cutoff (ng/mL) Amphetamine and metabolites    1000 Barbiturate and metabolites    200 Benzodiazepine                 016 Tricyclics and metabolites     300 Opiates and metabolites        300 Cocaine and metabolites        300 THC                            50 Performed at [facility], [place] [address], [place], [place] [address]    Ct Abdomen Pelvis Wo Contrast  Result Date: 05/23/2018 CLINICAL DATA:  Asthma, COPD, depression, abdominal pain and back pain EXAM: CT ABDOMEN AND PELVIS WITHOUT CONTRAST TECHNIQUE: Multidetector CT imaging of the abdomen and pelvis was performed following the standard protocol without IV contrast. COMPARISON:  CT 09/28/2017 FINDINGS: Lower chest: Lung bases are clear. Hepatobiliary: No focal hepatic lesion. No biliary duct dilatation. Gallbladder is  normal. Common bile duct is normal. Pancreas: Pancreas is normal. No ductal dilatation. No pancreatic inflammation. Spleen: Normal spleen Adrenals/urinary tract: Adrenal glands and kidneys are normal. The ureters and bladder normal. Stomach/Bowel: Stomach, small bowel, appendix, and cecum are normal. The colon and rectosigmoid colon are normal. Vascular/Lymphatic: Abdominal aorta is normal caliber. No periportal or retroperitoneal adenopathy. No pelvic adenopathy. Reproductive: Post hysterectomy Other: No free fluid. Musculoskeletal: No aggressive osseous lesion. IMPRESSION: No acute abdominopelvic findings. Electronically Signed   By: Suzy Bouchard M.D.   On: 05/23/2018 10:43   Ct Head Wo Contrast  Result Date: 05/23/2018 CLINICAL DATA:  Seizures with drug overdose EXAM: CT HEAD WITHOUT CONTRAST TECHNIQUE: Contiguous axial images were obtained from the base of the skull through the vertex without intravenous contrast. COMPARISON:  Brain MRI October 29, 2015 FINDINGS: Brain: The ventricles are  normal in size and configuration. There is no intracranial mass, hemorrhage, extra-axial fluid collection, or midline shift. Gray-white compartments appear unremarkable. No acute infarct evident. Vascular: No hyperdense vessel. There is no appreciable vascular calcification. Skull: The bony calvarium appears intact. Sinuses/Orbits: There is mucosal thickening in several ethmoid air cells. Visualized paranasal sinuses elsewhere appear clear. Visualized orbits appear symmetric bilaterally. Other: Mastoid air cells are clear. IMPRESSION: Mucosal thickening in several ethmoid air cells. Study otherwise unremarkable. Electronically Signed   By: Lowella Grip III M.D.   On: 05/23/2018 10:04    Pending Labs Unresulted Labs (From admission, onward)   Start     Ordered   05/24/18 0700  Acetaminophen level  Once-Timed,   R     05/23/18 0943   05/24/18 0700  Protime-INR  Once-Timed,   R     05/23/18 0943   05/24/18 0700  Comprehensive metabolic panel  Once-Timed,   R     05/23/18 0943   05/24/18 0500  CBC  Tomorrow morning,   R     05/23/18 0917   05/23/18 2000  Potassium  Once,   R     05/23/18 0914   05/23/18 0917  HIV antibody (Routine Testing)  Once,   R     05/23/18 0917      Vitals/Pain Today's Vitals   05/23/18 0915 05/23/18 0930 05/23/18 0945 05/23/18 1000  BP:  (!) 89/62  (!) 141/131  Pulse: 83 92 84 74  Resp: (!) _0 SpO2: 100% 100% 100% 100%  Weight:      Height:      PainSc:        Isolation Precautions No active isolations  Medications Medications  potassium chloride 10 mEq in 100 mL IVPB (has no administration in time range)  potassium chloride SA (K-DUR,KLOR-CON) CR tablet 40 mEq (has no administration in time range)  ondansetron (ZOFRAN) injection 4 mg (4 mg Intravenous Given 05/23/18 1017)  pantoprazole (PROTONIX) EC tablet 40 mg (has no administration in time range)  0.9 %  sodium chloride infusion ( Intravenous New Bag/Given 05/23/18 1021)  thiamine  (VITAMIN B-1) tablet 100 mg (has no administration in time range)  folic acid (FOLVITE) tablet 1 mg (has no administration in time range)  nicotine (NICODERM CQ - dosed in mg/24 hours) patch 14 mg (has no administration in time range)  albuterol (PROVENTIL HFA;VENTOLIN HFA) 108 (90 Base) MCG/ACT inhaler 2 puff (has no administration in time range)  amLODipine (NORVASC) tablet 5 mg (has no administration in time range)  mometasone-formoterol (DULERA) 100-5 MCG/ACT inhaler 2 puff (has no administration in time range)  citalopram (  CELEXA) tablet 10 mg (has no administration in time range)  ferrous sulfate tablet 325 mg (has no administration in time range)  gabapentin (NEURONTIN) capsule 300 mg (has no administration in time range)  ipratropium-albuterol (DUONEB) 0.5-2.5 (3) MG/3ML nebulizer solution 3 mL (has no administration in time range)  QUEtiapine (SEROQUEL) tablet 300 mg (has no administration in time range)  morphine 2 MG/ML injection 2 mg (2 mg Intravenous Given 05/23/18 1017)  acetylcysteine (ACETADOTE) 30,000 mg in dextrose 5 % 750 mL (40 mg/mL) infusion (has no administration in time range)  acetylcysteine (ACETADOTE) 40 mg/mL load via infusion 7,695 mg (7,695 mg Intravenous Bolus from Bag 05/23/18 0655)  acetylcysteine (ACETADOTE) 10,000 mg in dextrose 5 % 250 mL (40 mg/mL) infusion (0 mg/kg/hr  51.3 kg Intravenous Stopped 05/23/18 0649)  potassium chloride 10 mEq in 100 mL IVPB (0 mEq Intravenous Stopped 05/23/18 0930)    Mobility walks with person assist

## 2018-05-23 NOTE — ED Notes (Signed)
Bed: RESB Expected date:  Expected time:  Means of arrival:  Comments: 57 yo F/OD- 20Gram Tylenol

## 2018-05-23 NOTE — ED Triage Notes (Signed)
Pt arriving from home with overdose of Tylenol. Pt reports to EMS taking approx 20g for sciatic back pain 2-3 hours ago. Pt denies SI. Pt able to answer questions appropriately at this time. Pt admits to drinking "a 40" with her Tylenol.

## 2018-05-23 NOTE — ED Provider Notes (Signed)
[facility] DEPT Provider Note   CSN: [id number] Arrival date & time: 05/23/18  9381     History   Chief Complaint Chief Complaint  Patient presents with  . Ingestion    HPI Melissa Bridges is a 57 y.o. female.  HPI  This is a 57 year old female with a history of asthma, COPD, depression, schizoaffective disorder, hypertension, seizures who presents with abdominal pain, back pain, and drug overdose.  Patient reports that she has had left lower quadrant abdominal pain and back pain most of the day.  She took "a lot of Tylenol" throughout the day but "my pain did not get any better."  Patient estimates that she took 2500 mg Tylenol throughout the day.  This was not a single ingestion.  She denies trying to hurt herself.  She states "I just was trying to feel better."  She states that ultimately she started vomiting and was unable to tolerate any further Tylenol.  She is now having some right upper quadrant pain.  She denies any fevers, diarrhea, urinary symptoms.  She denies history of suicidal ideation.  Past Medical History:  Diagnosis Date  . Allergy   . Alopecia   . Anxiety   . Arthritis   . Asthma   . Cataract    "mild"  . COPD (chronic obstructive pulmonary disease) ([place])   . Depression   . GERD (gastroesophageal reflux disease)    o cc- takes OTC if needed.  . Glaucoma   . Hepatitis C   . Hypertension    onset age 40.  . Iron deficiency anemia   . Migraine   . Schizoaffective disorder ([place])   . Sciatic pain   . Seizures ([place])    onset in childhood. 06-03-16- per pt 8 months agoGeneralized tonic-clonic.Last seizure couple of months ago- last sz 9-10 months ago per pt 10-12-2016  . Shortness of breath dyspnea   . Substance abuse ([place])   . Ulcer     Patient Active Problem List   Diagnosis Date Noted  . Serrated adenoma of colon 08/05/2016  . Localization-related idiopathic epilepsy and epileptic syndromes with seizures of localized  onset, not intractable, without status epilepticus ([place]) 10/16/2015  . 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 Alcohol abuse 09/06/2014  . Asthma with acute exacerbation 03/09/2013  . Hypertension 03/09/2013  . Lower back pain 03/09/2013  . Chronic hepatitis C without hepatic coma ([place]) 03/08/2013  . Smoker 12/12/2012  . Schizoaffective disorder ([place]) 12/12/2012  . Substance abuse ([place]) 11/14/2011    Past Surgical History:  Procedure Laterality Date  . ABDOMINAL HYSTERECTOMY     ovarian cyst B, cervical dysplasia, fibroids.   Ovaries intact.  . COLONOSCOPY    . COLONOSCOPY W/ BIOPSIES    . DILATION AND CURETTAGE OF UTERUS     x2  . EXPLORATORY LAPAROTOMY     x3  . GYNECOLOGIC CRYOSURGERY     x3  . MANDIBLE RECONSTRUCTION N/A 06/27/2015   Procedure: REMOVAL MAXILLARY PALATAL TORUS.  REMOVAL MANDIBULAR TORUS AND EXOSTOSIS.  ;  Surgeon: Diona Browner, DDS;  Location: [facility];  Service: Oral Surgery;  Laterality: N/A;  . OVARIAN CYST REMOVAL  2008  . POLYPECTOMY       OB History   None      Home Medications    Prior to Admission medications   Medication Sig Start Date End Date Taking? Authorizing Provider  albuterol (VENTOLIN HFA) 108 (90 Base) MCG/ACT inhaler INHALE TWO PUFFS BY MOUTH EVERY  6 HOURS AS NEEDED FOR SHORTNESS OF BREATH 07/29/17  Yes Wardell Honour, MD  amLODipine (NORVASC) 5 MG tablet Take 1 tablet (5 mg total) by mouth daily. 07/29/17  Yes Wardell Honour, MD  budesonide-formoterol [facility] 80-4.5 MCG/ACT inhaler Inhale 2 puffs into the lungs 2 (two) times daily. 09/02/17  Yes Wardell Honour, MD  citalopram (CELEXA) 10 MG tablet Take 1 tablet (10 mg total) by mouth daily. For depression 07/28/17  Yes Arfeen, Arlyce Harman, MD  ferrous sulfate 325 (65 FE) MG tablet Take 1 tablet (325 mg total) by mouth daily with breakfast. 09/04/16  Yes Wardell Honour, MD  gabapentin (NEURONTIN) 300 MG capsule Take 1 capsule (300 mg total) by mouth 2 (two) times daily. Start with one at  night initially. 04/10/18  Yes Wendie Agreste, MD  ipratropium-albuterol (DUONEB) 0.5-2.5 (3) MG/3ML SOLN Take 3 mLs by nebulization every 4 (four) hours as needed (shortness of breath). Reported on 12/02/2015   Yes [provider]  LORazepam (ATIVAN) 1 MG tablet Take 1 mg by mouth daily as needed for anxiety.   Yes [provider]  methocarbamol (ROBAXIN) 500 MG tablet Take 1-2 tablets (500-1,000 mg total) by mouth every 8 (eight) hours as needed for muscle spasms. 02/04/18  Yes Molpus, John, MD  QUEtiapine (SEROQUEL) 300 MG tablet Take 1 tablet (300 mg total) by mouth at bedtime. Mood control 07/28/17  Yes Arfeen, Arlyce Harman, MD  valACYclovir (VALTREX) 1000 MG tablet Take 2 tabs po at symptom onset, repeat in 12 hours Patient taking differently: Take 2,000 mg by mouth daily as needed (flare ups).  07/29/17  Yes Wardell Honour, MD  cyclobenzaprine (FLEXERIL) 5 MG tablet 1 pill by mouth up to every 8 hours as needed. Patient not taking: Reported on 05/23/2018 04/10/18   Wendie Agreste, MD  fluticasone-salmeterol (ADVAIR [facility]) [phone] MCG/ACT inhaler Inhale 2 puffs into the lungs 2 (two) times daily. Patient not taking: Reported on 05/23/2018 07/29/17   Wardell Honour, MD  LamoTRIgine (LAMICTAL XR) 25 MG TB24 tablet take 1 tablet daily for 2 weeks, then increase to 2 tablets daily for 2 weeks, then increase to 4 tablets daily then see me in follow-up Patient not taking: Reported on 05/23/2018 12/02/15   Cameron Sprang, MD  metroNIDAZOLE (FLAGYL) 500 MG tablet Take 1 tablet (500 mg total) by mouth 2 (two) times daily. Patient not taking: Reported on 05/23/2018 09/28/17   Montine Circle, PA-C  triamcinolone cream (KENALOG) 0.1 % APPLY  CREAM EXTERNALLY TWICE DAILY Patient not taking: Reported on 05/23/2018 09/02/17   Wardell Honour, MD    Family History Family History  Problem Relation Age of Onset  . Diabetes type II Mother   . Hypertension Mother   . Arthritis Mother   . Diabetes  Mother   . Mental illness Mother        bipolar  . Diabetes type II Maternal Aunt   . Hypertension Father   . Heart murmur Father   . Arthritis Father   . Stroke Father   . Alopecia Sister   . Alopecia Brother   . Alopecia Sister   . Mental retardation Sister        depression  . Prostate cancer Paternal Grandfather   . Cancer Maternal Uncle   . Colon cancer Neg Hx   . Esophageal cancer Neg Hx   . Rectal cancer Neg Hx   . Stomach cancer Neg Hx   . Colon polyps  Neg Hx     Social History Social History   Tobacco Use  . Smoking status: Current Every Day Smoker    Packs/day: 0.33    Years: 44.00    Pack years: 14.52    Types: Cigarettes  . Smokeless tobacco: Never Used  Substance Use Topics  . Alcohol use: Yes    Alcohol/week: 0.0 oz    Comment: occasionally  . Drug use: No    Types: Marijuana    Comment: no longer using     Allergies   Fruit & vegetable daily [nutritional supplements]; Iodinated diagnostic agents; Ibuprofen; and Aspirin   Review of Systems Review of Systems  Constitutional: Negative for fever.  Respiratory: Negative for shortness of breath.   Cardiovascular: Negative for chest pain.  Gastrointestinal: Positive for abdominal pain, nausea and vomiting. Negative for diarrhea.  Genitourinary: Negative for dysuria.  Musculoskeletal: Positive for back pain.  Neurological: Negative for weakness, numbness and headaches.  All other systems reviewed and are negative.    Physical Exam Updated Vital Signs BP 114/66 (BP Location: Right Arm)   Pulse 65   Resp 14   Ht 5\' 3"  (1.6 m)   Wt 51.3 kg (113 lb)   SpO2 95%   BMI 20.02 kg/m   Physical Exam  Constitutional: She is oriented to person, place, and time. She appears well-developed and well-nourished.  Uncomfortable appearing but nontoxic, no acute distress  HENT:  Head: Normocephalic and atraumatic.  Eyes: Pupils are equal, round, and reactive to light.  Neck: Neck supple.  Cardiovascular:  Normal rate, regular rhythm and normal heart sounds.  No murmur heard. Pulmonary/Chest: Effort normal and breath sounds normal. No respiratory distress. She has no wheezes.  Abdominal: Soft. Bowel sounds are normal. She exhibits no mass. There is tenderness. There is no guarding.  Diffuse tenderness to palpation, no rebound or guarding  Genitourinary:  Genitourinary Comments: No CVA tenderness  Musculoskeletal:  No midline tenderness to palpation, step-off, deformity  Neurological: She is alert and oriented to person, place, and time.  Skin: Skin is warm and dry.  Psychiatric: She has a normal mood and affect.  Nursing note and vitals reviewed.    ED Treatments / Results  Labs (all labs ordered are listed, but only abnormal results are displayed) Labs Reviewed  CBC WITH DIFFERENTIAL/PLATELET - Abnormal; Notable for the following components:      Result Value   RBC 3.54 (*)    Hemoglobin 10.2 (*)    HCT 31.7 (*)    RDW 16.2 (*)    All other components within normal limits  BASIC METABOLIC PANEL - Abnormal; Notable for the following components:   Potassium 2.9 (*)    Chloride 100 (*)    Glucose, Bld 119 (*)    Creatinine, Ser 1.22 (*)    GFR calc non Af Amer 48 (*)    GFR calc Af Amer 55 (*)    All other components within normal limits  ACETAMINOPHEN LEVEL - Abnormal; Notable for the following components:   Acetaminophen (Tylenol), Serum 269 (*)    All other components within normal limits  HEPATIC FUNCTION PANEL - Abnormal; Notable for the following components:   AST 60 (*)    Indirect Bilirubin 0.2 (*)    All other components within normal limits  ETHANOL  SALICYLATE LEVEL  PROTIME-INR  LIPASE, BLOOD  RAPID URINE DRUG SCREEN, HOSP PERFORMED  I-STAT TROPONIN, ED    EKG EKG Interpretation  Date/Time:  Tuesday May 23 2018  05:32:36 EDT Ventricular Rate:  85 PR Interval:    QRS Duration: 96 QT Interval:  439 QTC Calculation: 523 R Axis:   31 Text  Interpretation:  Sinus rhythm Left atrial enlargement RSR' in V1 or V2, right VCD or RVH Abnrm T, probable ischemia, anterolateral lds Prolonged QT interval T wave inversions inferior and laterally, new from prior Confirmed by Thayer Jew [phone]) on 05/23/2018 6:28:26 AM   Radiology No results found.  Procedures Procedures (including critical care time)  CRITICAL CARE Performed by: Merryl Hacker   Total critical care time: 45 minutes  Critical care time was exclusive of separately billable procedures and treating other patients.  Critical care was necessary to treat or prevent imminent or life-threatening deterioration.  Critical care was time spent personally by me on the following activities: development of treatment plan with patient and/or surrogate as well as nursing, discussions with consultants, evaluation of patient's response to treatment, examination of patient, obtaining history from patient or surrogate, ordering and performing treatments and interventions, ordering and review of laboratory studies, ordering and review of radiographic studies, pulse oximetry and re-evaluation of patient's condition.   Medications Ordered in ED Medications  acetylcysteine (ACETADOTE) 40 mg/mL load via infusion 7,695 mg (has no administration in time range)  potassium chloride 10 mEq in 100 mL IVPB (has no administration in time range)  acetylcysteine (ACETADOTE) 10,000 mg in dextrose 5 % 250 mL (40 mg/mL) infusion (15 mg/kg/hr  51.3 kg Intravenous New Bag/Given 05/23/18 0641)     Initial Impression / Assessment and Plan / ED Course  I have reviewed the triage vital signs and the nursing notes.  Pertinent labs & imaging results that were available during my care of the patient were reviewed by me and considered in my medical decision making (see chart for details).     Discussed with poison control.  Recommending normal lab work as well as PT/INR.  Given that this was not a  single ingestion but was over a period of time, recommend loading dose of N-acetylcysteine.  If Tylenol levels at 0 and 4 hours are reassuring, further N-acetylcysteine not warranted.  Will update poison control.  Initial Tylenol level 269.  We will continue infusion.  LFTs are normal.  Patient does have a slight bump in her creatinine.  INR is also normal.  She will need recheck of these labs.  Patient will be admitted to the stepdown unit for further N-acetylcysteine and repeat labs.  I have ordered a CT scan noncontrasted of the abdomen and pelvis to ensure no other etiology of her abdominal pain.  Final Clinical Impressions(s) / ED Diagnoses   Final diagnoses:  Tylenol overdose, accidental or unintentional, initial encounter  Generalized abdominal pain    ED Discharge Orders    None       Merryl Hacker, MD 05/23/18 [phone]

## 2018-05-24 DIAGNOSIS — M25552 Pain in left hip: Secondary | ICD-10-CM

## 2018-05-24 DIAGNOSIS — F17200 Nicotine dependence, unspecified, uncomplicated: Secondary | ICD-10-CM | POA: Diagnosis not present

## 2018-05-24 DIAGNOSIS — T391X1A Poisoning by 4-Aminophenol derivatives, accidental (unintentional), initial encounter: Secondary | ICD-10-CM | POA: Diagnosis not present

## 2018-05-24 DIAGNOSIS — G40909 Epilepsy, unspecified, not intractable, without status epilepticus: Secondary | ICD-10-CM

## 2018-05-24 DIAGNOSIS — I10 Essential (primary) hypertension: Secondary | ICD-10-CM | POA: Diagnosis not present

## 2018-05-24 DIAGNOSIS — F250 Schizoaffective disorder, bipolar type: Secondary | ICD-10-CM

## 2018-05-24 DIAGNOSIS — B182 Chronic viral hepatitis C: Secondary | ICD-10-CM | POA: Diagnosis not present

## 2018-05-24 DIAGNOSIS — T391X4A Poisoning by 4-Aminophenol derivatives, undetermined, initial encounter: Secondary | ICD-10-CM | POA: Diagnosis not present

## 2018-05-24 DIAGNOSIS — N179 Acute kidney failure, unspecified: Secondary | ICD-10-CM

## 2018-05-24 DIAGNOSIS — J439 Emphysema, unspecified: Secondary | ICD-10-CM | POA: Diagnosis not present

## 2018-05-24 DIAGNOSIS — E876 Hypokalemia: Secondary | ICD-10-CM | POA: Diagnosis not present

## 2018-05-24 DIAGNOSIS — F1410 Cocaine abuse, uncomplicated: Secondary | ICD-10-CM | POA: Diagnosis not present

## 2018-05-24 DIAGNOSIS — G8929 Other chronic pain: Secondary | ICD-10-CM

## 2018-05-24 LAB — CBC
HEMATOCRIT: 28.1 % — AB (ref 36.0–46.0)
HEMOGLOBIN: 8.9 g/dL — AB (ref 12.0–15.0)
MCH: 28.5 pg (ref 26.0–34.0)
MCHC: 31.7 g/dL (ref 30.0–36.0)
MCV: 90.1 fL (ref 78.0–100.0)
Platelets: 183 10*3/uL (ref 150–400)
RBC: 3.12 MIL/uL — AB (ref 3.87–5.11)
RDW: 16.2 % — ABNORMAL HIGH (ref 11.5–15.5)
WBC: 4.7 10*3/uL (ref 4.0–10.5)

## 2018-05-24 LAB — COMPREHENSIVE METABOLIC PANEL
ALBUMIN: 3.5 g/dL (ref 3.5–5.0)
ALT: 31 U/L (ref 14–54)
ANION GAP: 5 (ref 5–15)
AST: 42 U/L — ABNORMAL HIGH (ref 15–41)
Alkaline Phosphatase: 40 U/L (ref 38–126)
BUN: <5 mg/dL — ABNORMAL LOW (ref 6–20)
CO2: 24 mmol/L (ref 22–32)
Calcium: 9 mg/dL (ref 8.9–10.3)
Chloride: 109 mmol/L (ref 101–111)
Creatinine, Ser: 0.76 mg/dL (ref 0.44–1.00)
GFR calc non Af Amer: >60 mL/min (ref >60)
GLUCOSE: 129 mg/dL — AB (ref 65–99)
POTASSIUM: 3.7 mmol/L (ref 3.5–5.1)
SODIUM: 138 mmol/L (ref 135–145)
Total Bilirubin: 0.4 mg/dL (ref 0.3–1.2)
Total Protein: 6.9 g/dL (ref 6.5–8.1)

## 2018-05-24 LAB — PROTIME-INR
INR: 1.14
PROTHROMBIN TIME: 14.5 s (ref 11.4–15.2)

## 2018-05-24 LAB — HIV ANTIBODY (ROUTINE TESTING W REFLEX): HIV SCREEN 4TH GENERATION: NONREACTIVE

## 2018-05-24 LAB — ACETAMINOPHEN LEVEL: Acetaminophen (Tylenol), Serum: <10 ug/mL — ABNORMAL LOW (ref 10–30)

## 2018-05-24 LAB — POTASSIUM: Potassium: 3.5 mmol/L (ref 3.5–5.1)

## 2018-05-24 LAB — MAGNESIUM: MAGNESIUM: 1.8 mg/dL (ref 1.7–2.4)

## 2018-05-24 MED ORDER — GABAPENTIN 300 MG PO CAPS: 400.0000 mg | ORAL_CAPSULE | Freq: Three times a day (TID) | ORAL | 0 refills | Status: DC

## 2018-05-24 MED ORDER — LAMOTRIGINE ER 25 MG PO TB24: ORAL_TABLET | ORAL | 0 refills | Status: DC

## 2018-05-24 MED ORDER — LAMOTRIGINE ER 25 MG PO TB24
25.0000 mg | ORAL_TABLET | Freq: Every day | ORAL | Status: DC
  Filled 2018-05-24: qty 1

## 2018-05-24 MED ORDER — DICLOFENAC SODIUM 1 % TD GEL
2.0000 g | Freq: Four times a day (QID) | TRANSDERMAL | Status: DC
  Administered 2018-05-24: 2 g via TOPICAL
  Filled 2018-05-24: qty 100

## 2018-05-24 MED ORDER — METHOCARBAMOL 500 MG PO TABS
500.0000 mg | ORAL_TABLET | Freq: Three times a day (TID) | ORAL | Status: DC
  Administered 2018-05-24 (×2): 500 mg via ORAL
  Filled 2018-05-24 (×2): qty 1

## 2018-05-24 MED ORDER — DICLOFENAC SODIUM 1 % TD GEL: 2.0000 g | Freq: Four times a day (QID) | TRANSDERMAL | 0 refills | Status: DC

## 2018-05-24 MED ORDER — FERROUS SULFATE 325 (65 FE) MG PO TABS: 325.0000 mg | ORAL_TABLET | Freq: Every day | ORAL | 1 refills | Status: DC

## 2018-05-24 MED ORDER — GABAPENTIN 400 MG PO CAPS
400.0000 mg | ORAL_CAPSULE | Freq: Three times a day (TID) | ORAL | Status: DC
  Administered 2018-05-24: 400 mg via ORAL
  Filled 2018-05-24: qty 1

## 2018-05-24 MED ORDER — ENOXAPARIN SODIUM 40 MG/0.4ML ~~LOC~~ SOLN
40.0000 mg | SUBCUTANEOUS | Status: DC
  Administered 2018-05-24: 40 mg via SUBCUTANEOUS
  Filled 2018-05-24: qty 0.4

## 2018-05-24 MED ORDER — METHOCARBAMOL 500 MG PO TABS: 1000.0000 mg | ORAL_TABLET | Freq: Three times a day (TID) | ORAL | 0 refills | Status: DC | PRN

## 2018-05-24 NOTE — Progress Notes (Signed)
PT Cancellation Note  Patient Details Name: Melissa Bridges MRN: [id number] DOB: Jun 11, 1961   Cancelled Treatment:    Reason Eval/Treat Not Completed: PT screened, no needs identified, will sign off- per RN.   Claretha Cooper 05/24/2018, 12:44 PM  Tresa Endo PT [phone]

## 2018-05-24 NOTE — Discharge Instructions (Signed)
Stop using Cocaine. I can cause seizures, heart attacks, strokes and if snorted, may lung issues and infections.  You were cared for by a hospitalist during your hospital stay. If you have any questions about your discharge medications or the care you received while you were in the hospital after you are discharged, you can call the unit and asked to speak with the hospitalist on call if the hospitalist that took care of you is not available. Once you are discharged, your primary care physician will handle any further medical issues. Please note that NO REFILLS for any discharge medications will be authorized once you are discharged, as it is imperative that you return to your primary care physician (or establish a relationship with a primary care physician if you do not have one) for your aftercare needs so that they can reassess your need for medications and monitor your lab values.  Please take all your medications with you for your next visit with your Primary MD. Please ask your Primary MD to get all Hospital records sent to his/her office. Please request your Primary MD to go over all hospital test results at the follow up.    If you experience worsening of your admission symptoms, develop shortness of breath, chest pain, suicidal or homicidal thoughts or a life threatening emergency, you must seek medical attention immediately by calling 911 or calling your MD.   Dennis Bast must read the complete instructions/literature along with all the possible adverse reactions/side effects for all the medicines you take including new medications that have been prescribed to you. Take new medicines after you have completely understood and accpet all the possible adverse reactions/side effects.    Do not drive when taking pain medications or sedatives.     Do not take more than prescribed Pain, Sleep and Anxiety Medications   If you have smoked or chewed Tobacco in the last 2 yrs please stop. Stop any regular  alcohol  and or recreational drug use.   Wear Seat belts while driving.

## 2018-05-24 NOTE — Discharge Summary (Signed)
Physician Discharge Summary  Melissa Bridges FUX:[id number] DOB: [birth year] DOA: 05/23/2018  PCP: Wardell Honour, MD  Admit date: 05/23/2018 Discharge date: 05/24/2018  Admitted From: Home Disposition: Home  Recommendations for Outpatient Follow-up:  1. Primary care provider to ensure that patient has sufficient prescriptions 2. Patient may need ongoing case management assistance to ensure she has transportation to her PCP office 3. Patient may need referral back to her orthopedic doctor for left hip pain  Discharge Condition:   Stable CODE STATUS:   Full code   Consultations:  None   Discharge Diagnoses:  Principal Problem:   Tylenol overdose Active Problems:   Hypokalemia   AKI (acute kidney injury) ([place])   Cocaine abuse ([place])   Hypomagnesemia   Smoker   Schizoaffective disorder ([place])   Chronic hepatitis C without hepatic coma (H[facility])   Hypertension   Lower back pain   Seizure disorder ([place])   COPD (chronic obstructive pulmonary disease) ([place])   Chronic left hip pain      Brief Summary:  Melissa Bridges is a 57 y.o. female with medical history significant of hypertension, seizure affective disorder, hepatitis C, COPD, seizure disorder, nicotine abuse, alcohol abuse chronic back pain who presented to the emergency department with complaints of abdominal pain. Patient reported that she has chronic back pain and she took 20 pills of 500 mg Tylenol to relieve the pain. She started having abdominal pain nausea and vomiting and she presented to the emergency department.  She states that due to transportation issues she has not been back to see her primary care physician in over a year and therefore did not have refills on many of her medications.  Other refills were obtained through an urgent care or ED.  Tylenol level was found to be elevated at [facility] Course:  Unintentional Tylenol overdose -Started on Mucomyst - Tylenol level today is less than 10 and poison  control has recommended for Mucomyst to be discontinued -She has been advised not to take excess Tylenol again  AKI -Creatinine 1.22 on admission has improved to 0.76  Hypokalemia and Hypomagnesemia - treated and resolved-potassium has improved from 2.9-3.7 and magnesium has improved from 1.5-1.8  Chronic pain- left hip and lower back pain -Has not been to see her doctor in over a year and therefore was mainly taking Neurontin 300 mg 3 times a day (her prescription states BID but she states she takes it TID), Robaxin 500 mg 3 times a day, at least 6 Aleve a day and excess Tylenol as mentioned above - She states she previously saw an orthopedic surgeon for the left hip and was told that she had a bone spur- xrays from 4/29 show osteoarthritis- it hurts worse the more she walks - She states that if given prescriptions, will be able to obtain medications - I will prescribe a slightly higher dose of Neurontin of 400 mg 3 times daily, double the Robaxin, and add Voltaren gel, she can take Tylenol as directed on bottle - she will need to avoid NSAIDS for now as she has had vomiting (? Gastritis) - avoid narcotics in setting of Cocaine use - cannot use Ultram in setting of seizures - will get her back in with her previous PCP, Dr Joen Laura have obtained an appointment for the PA   - she had a pain contract with her in the past, per notes- she can subsequently be referred back to ortho for her pain  Iron deficiency anemia -Hemoglobin 8.9  today-I have given her prescription for the iron that was previously prescribed to her  Cocaine abuse I have discussed with her the risks of cocaine use and cessation  Transportation issues - case management to help her arrange transportation- she should not drive due to last seizure being ~ 1 mo ago  Seizure disorder -Ran out of Lamictal- last seizure was a few wks ago because of not having medications - Lamictal resumed at 25 mg daily to be titrated  back up to 50 mg daily   Schizoaffective disorder  -Continue citalopram and Seroquel which she has been taking at home  Nicotine abuse 1 pack of cigarettes last for about 3 days-she has cut back from 2 packs/day  COPD - she she has nebulizer treatments and Symbicort at home w is Dr. Wynelle Cleveland can I speak with 11's nurse hich she has been using   Discharge Exam: Vitals:   05/23/18 2213 05/24/18 0605  BP: (!) 160/89 112/73  Pulse: 86 77  Resp:    Temp: 98 F (36.7 C) 97.7 F (36.5 C)  SpO2: 100% 100%   Vitals:   05/23/18 1455 05/23/18 2140 05/23/18 2213 05/24/18 0605  BP: (!) 170/99  (!) 160/89 112/73  Pulse: 85  86 77  Resp:      Temp: 98.7 F (37.1 C)  98 F (36.7 C) 97.7 F (36.5 C)  TempSrc: Oral  Oral Oral  SpO2: 100% 100% 100% 100%  Weight:      Height:        General: Pt is alert, awake, not in acute distress Cardiovascular: RRR, S1/S2 +, no rubs, no gallops Respiratory: CTA bilaterally, no wheezing, no rhonchi Abdominal: Soft, NT, ND, bowel sounds + Extremities: no edema, no cyanosis MSK: tenderness in left groin  Noted on exam- mild tenderness in left flank/lower back   Discharge Instructions  Discharge Instructions    Diet - low sodium heart healthy   Complete by:  As directed    Increase activity slowly   Complete by:  As directed      Allergies as of 05/24/2018      Reactions   Fruit & Vegetable Daily [nutritional Supplements] Shortness Of Breath   Aloe   Iodinated Diagnostic Agents Swelling   Swelling and itching of left side of her face only after CT SI injection(no steroid used)      Medication List    TAKE these medications   albuterol 108 (90 Base) MCG/ACT inhaler Commonly known as:  VENTOLIN HFA INHALE TWO PUFFS BY MOUTH EVERY 6 HOURS AS NEEDED FOR SHORTNESS OF BREATH   amLODipine 5 MG tablet Commonly known as:  NORVASC Take 1 tablet (5 mg total) by mouth daily.   budesonide-formoterol 80-4.5 MCG/ACT inhaler Commonly known as:   SYMBICORT Inhale 2 puffs into the lungs 2 (two) times daily.   citalopram 10 MG tablet Commonly known as:  CELEXA Take 1 tablet (10 mg total) by mouth daily. For depression   diclofenac sodium 1 % Gel Commonly known as:  VOLTAREN Apply 2 g topically 4 (four) times daily.   ferrous sulfate 325 (65 FE) MG tablet Take 1 tablet (325 mg total) by mouth daily with breakfast.   gabapentin 300 MG capsule Commonly known as:  NEURONTIN Take 1 capsule (300 mg total) by mouth 3 (three) times daily. She states she was taking this 300 mg 3 times a day. I have increased it to 400 mg 3 times a day. What changed:    when  to take this  additional instructions   ipratropium-albuterol 0.5-2.5 (3) MG/3ML Soln Commonly known as:  DUONEB Take 3 mLs by nebulization every 4 (four) hours as needed (shortness of breath). Reported on 12/02/2015   LamoTRIgine 25 MG Tb24 24 hour tablet Commonly known as:  LAMICTAL XR take 1 tablet daily for 2 weeks, then increase to 2 tablets daily for 2 weeks What changed:  additional instructions   methocarbamol 500 MG tablet Commonly known as:  ROBAXIN Take 2 tablets (1,000 mg total) by mouth every 8 (eight) hours as needed for muscle spasms. What changed:  how much to take   QUEtiapine 300 MG tablet Commonly known as:  SEROQUEL Take 1 tablet (300 mg total) by mouth at bedtime. Mood control   triamcinolone cream 0.1 % Commonly known as:  KENALOG APPLY  CREAM EXTERNALLY TWICE DAILY   valACYclovir 1000 MG tablet Commonly known as:  VALTREX Take 2 tabs po at symptom onset, repeat in 12 hours What changed:    how much to take  how to take this  when to take this  reasons to take this  additional instructions      Follow-up Information    [facility]. Call.   Why:  call and make appointment for a hospital follow visit and to get a pcp. Contact information: [place]  [id number] [phone]       [facility] Follow up.   Why:  PA Bess Harvest will see you on Friday 6/15 at 1:45 PM- Please be there Contact information: [place] [address] [phone]         Allergies  Allergen Reactions  . Fruit & Vegetable Daily [Nutritional Supplements] Shortness Of Breath    Aloe  . Iodinated Diagnostic Agents Swelling    Swelling and itching of left side of her face only after CT SI injection(no steroid used)     Procedures/Studies:    Ct Abdomen Pelvis Wo Contrast  Result Date: 05/23/2018 CLINICAL DATA:  Asthma, COPD, depression, abdominal pain and back pain EXAM: CT ABDOMEN AND PELVIS WITHOUT CONTRAST TECHNIQUE: Multidetector CT imaging of the abdomen and pelvis was performed following the standard protocol without IV contrast. COMPARISON:  CT 09/28/2017 FINDINGS: Lower chest: Lung bases are clear. Hepatobiliary: No focal hepatic lesion. No biliary duct dilatation. Gallbladder is normal. Common bile duct is normal. Pancreas: Pancreas is normal. No ductal dilatation. No pancreatic inflammation. Spleen: Normal spleen Adrenals/urinary tract: Adrenal glands and kidneys are normal. The ureters and bladder normal. Stomach/Bowel: Stomach, small bowel, appendix, and cecum are normal. The colon and rectosigmoid colon are normal. Vascular/Lymphatic: Abdominal aorta is normal caliber. No periportal or retroperitoneal adenopathy. No pelvic adenopathy. Reproductive: Post hysterectomy Other: No free fluid. Musculoskeletal: No aggressive osseous lesion. IMPRESSION: No acute abdominopelvic findings. Electronically Signed   By: Suzy Bouchard M.D.   On: 05/23/2018 10:43   Ct Head Wo Contrast  Result Date: 05/23/2018 CLINICAL DATA:  Seizures with drug overdose EXAM: CT HEAD WITHOUT CONTRAST TECHNIQUE: Contiguous axial images were obtained from the base of the skull through the vertex without intravenous contrast. COMPARISON:  Brain  MRI October 29, 2015 FINDINGS: Brain: The ventricles are normal in size and configuration. There is no intracranial mass, hemorrhage, extra-axial fluid collection, or midline shift. Gray-white compartments appear unremarkable. No acute infarct evident. Vascular: No hyperdense vessel. There is no appreciable vascular calcification. Skull: The bony calvarium appears intact. Sinuses/Orbits: There is mucosal  thickening in several ethmoid air cells. Visualized paranasal sinuses elsewhere appear clear. Visualized orbits appear symmetric bilaterally. Other: Mastoid air cells are clear. IMPRESSION: Mucosal thickening in several ethmoid air cells. Study otherwise unremarkable. Electronically Signed   By: Lowella Grip III M.D.   On: 05/23/2018 10:04     The results of significant diagnostics from this hospitalization (including imaging, microbiology, ancillary and laboratory) are listed below for reference.     Microbiology: No results found for this or any previous visit (from the past 240 hour(s)).   Labs: BNP (last 3 results) No results for input(s): BNP in the last 8760 hours. Basic Metabolic Panel: Recent Labs  Lab 05/23/18 0530 05/23/18 2203 05/24/18 0713 05/24/18 1009  NA 136  --   --  138  K 2.9* 3.5  --  3.7  CL 100*  --   --  109  CO2 26  --   --  24  GLUCOSE 119*  --   --  129*  BUN 9  --   --  <5*  CREATININE 1.22*  --   --  0.76  CALCIUM 9.5  --   --  9.0  MG 1.4*  --  1.8  --    Liver Function Tests: Recent Labs  Lab 05/23/18 0530 05/24/18 1009  AST 60* 42*  ALT 40 31  ALKPHOS 65 40  BILITOT 0.3 0.4  PROT 8.1 6.9  ALBUMIN 4.3 3.5   Recent Labs  Lab 05/23/18 0530  LIPASE 38   No results for input(s): AMMONIA in the last 168 hours. CBC: Recent Labs  Lab 05/23/18 0530 05/24/18 0713  WBC 5.5 4.7  NEUTROABS 3.0  --   HGB 10.2* 8.9*  HCT 31.7* 28.1*  MCV 89.5 90.1  PLT 205 183   Cardiac Enzymes: No results for input(s): CKTOTAL, CKMB, CKMBINDEX,  TROPONINI in the last 168 hours. BNP: Invalid input(s): POCBNP CBG: No results for input(s): GLUCAP in the last 168 hours. D-Dimer No results for input(s): DDIMER in the last 72 hours. Hgb A1c No results for input(s): HGBA1C in the last 72 hours. Lipid Profile No results for input(s): CHOL, HDL, LDLCALC, TRIG, CHOLHDL, LDLDIRECT in the last 72 hours. Thyroid function studies No results for input(s): TSH, T4TOTAL, T3FREE, THYROIDAB in the last 72 hours.  Invalid input(s): FREET3 Anemia work up No results for input(s): VITAMINB12, FOLATE, FERRITIN, TIBC, IRON, RETICCTPCT in the last 72 hours. Urinalysis    Component Value Date/Time   COLORURINE YELLOW 02/04/2018 0045   APPEARANCEUR CLEAR 02/04/2018 0045   LABSPEC <1.005 (L) 02/04/2018 0045   PHURINE 5.5 02/04/2018 0045   GLUCOSEU NEGATIVE 02/04/2018 0045   HGBUR TRACE (A) 02/04/2018 0045   BILIRUBINUR negative 04/10/2018 1209   BILIRUBINUR neg 06/10/2015 1154   KETONESUR negative 04/10/2018 1209   KETONESUR NEGATIVE 02/04/2018 0045   PROTEINUR negative 04/10/2018 1209   PROTEINUR NEGATIVE 02/04/2018 0045   UROBILINOGEN 0.2 04/10/2018 1209   UROBILINOGEN 0.2 08/25/2015 2049   NITRITE Negative 04/10/2018 1209   NITRITE NEGATIVE 02/04/2018 0045   LEUKOCYTESUR Negative 04/10/2018 1209   Sepsis Labs Invalid input(s): PROCALCITONIN,  WBC,  LACTICIDVEN Microbiology No results found for this or any previous visit (from the past 240 hour(s)).   Time coordinating discharge in minutes: 60  SIGNED:   Debbe Odea, MD  [facility] 05/24/2018, 12:48 PM Pager   If 7PM-7AM, please contact night-coverage [email or web address] Password [email or web address]

## 2018-05-24 NOTE — Care Management Note (Signed)
Case Management Note  Patient Details  Name: Melissa Bridges MRN: [id number] Date of Birth: 23-Jun-1961  Subjective/Objective:                  Transportation needs  Action/Plan: Telephone numbers for [employer] city T  AS for the scat bus given to patient with instruction on how to call for ride to [place]  [place] appointment given. Patient gave verbal understanding.  Expected Discharge Date:  05/24/18               Expected Discharge Plan:  Home/Self Care  In-House Referral:     Discharge planning Services  CM Consult, Follow-up appt scheduled, Other - See comment  Post Acute Care Choice:    Choice offered to:     DME Arranged:    DME Agency:     HH Arranged:    HH Agency:     Status of Service:  Completed, signed off  If discussed at [employer] of Stay Meetings, dates discussed:    Additional Comments:  Leeroy Cha, RN 05/24/2018, 1:01 PM

## 2018-05-24 NOTE — Progress Notes (Signed)
Pharmacy: Re- Acetadote  Patient's a 57 y.o F presented to to the ED on 6/11 for tylenol overdose.  Acetadote drip started on admission.  - 6/11 at 0530: APAP level= 269, INR 1.03, LFTs wnl - 6/11: acetadote LD given at 0641,drip at 1119 - 6/12 at 1009: APAP <10, INR 1.14, AST/ALT 42/31, Tbili 0.4 Spoke to [place] at [facility], she recom. to d/c Acetadote drip --> informed Dr. Wynelle Cleveland  Plan: - D/c Acetadote drip  Dia Sitter, PharmD, BCPS 05/24/2018 11:16 AM

## 2018-05-24 NOTE — Progress Notes (Signed)
Patient has discharged home on 05/24/18. Discharge instruction including medication and appointment was given to patient. Patient has no question at this time.

## 2018-05-24 NOTE — Care Management Obs Status (Signed)
[place] NOTIFICATION   Patient Details  Name: Melissa Bridges MRN: [id number] Date of Birth: 10/16/1961   Medicare Observation Status Notification Given:  Yes    Leeroy Cha, RN 05/24/2018, 1:10 PM

## 2018-05-25 ENCOUNTER — Emergency Department (HOSPITAL_COMMUNITY)
Admission: EM | Admit: 2018-05-25 | Discharge: 2018-05-26 | Disposition: A | Discharge status: Home / Self Care | Payer: 59 | Attending: Emergency Medicine | Admitting: Emergency Medicine

## 2018-05-25 ENCOUNTER — Encounter (HOSPITAL_COMMUNITY): Payer: Self-pay

## 2018-05-25 DIAGNOSIS — R195 Other fecal abnormalities: Secondary | ICD-10-CM | POA: Insufficient documentation

## 2018-05-25 DIAGNOSIS — R1030 Lower abdominal pain, unspecified: Secondary | ICD-10-CM

## 2018-05-25 DIAGNOSIS — F17210 Nicotine dependence, cigarettes, uncomplicated: Secondary | ICD-10-CM | POA: Insufficient documentation

## 2018-05-25 DIAGNOSIS — I10 Essential (primary) hypertension: Secondary | ICD-10-CM | POA: Diagnosis not present

## 2018-05-25 DIAGNOSIS — R1031 Right lower quadrant pain: Secondary | ICD-10-CM | POA: Insufficient documentation

## 2018-05-25 DIAGNOSIS — J449 Chronic obstructive pulmonary disease, unspecified: Secondary | ICD-10-CM | POA: Diagnosis not present

## 2018-05-25 LAB — COMPREHENSIVE METABOLIC PANEL WITH GFR
ALT: 44 U/L (ref 14–54)
AST: 79 U/L — ABNORMAL HIGH (ref 15–41)
Albumin: 4.4 g/dL (ref 3.5–5.0)
Alkaline Phosphatase: 51 U/L (ref 38–126)
Anion gap: 10 (ref 5–15)
BUN: 8 mg/dL (ref 6–20)
CO2: 25 mmol/L (ref 22–32)
Calcium: 9.9 mg/dL (ref 8.9–10.3)
Chloride: 102 mmol/L (ref 101–111)
Creatinine, Ser: 0.99 mg/dL (ref 0.44–1.00)
GFR calc Af Amer: >60 mL/min
GFR calc non Af Amer: >60 mL/min
Glucose, Bld: 98 mg/dL (ref 65–99)
Potassium: 3.5 mmol/L (ref 3.5–5.1)
Sodium: 137 mmol/L (ref 135–145)
Total Bilirubin: 0.3 mg/dL (ref 0.3–1.2)
Total Protein: 8.2 g/dL — ABNORMAL HIGH (ref 6.5–8.1)

## 2018-05-25 LAB — URINALYSIS, ROUTINE W REFLEX MICROSCOPIC
Bacteria, UA: NONE SEEN
Bilirubin Urine: NEGATIVE
Glucose, UA: NEGATIVE mg/dL
Ketones, ur: 5 mg/dL — AB
Leukocytes, UA: NEGATIVE
Nitrite: NEGATIVE
Protein, ur: NEGATIVE mg/dL
Specific Gravity, Urine: 1.01 (ref 1.005–1.030)
pH: 6 (ref 5.0–8.0)

## 2018-05-25 LAB — CBC
HCT: 29 % — ABNORMAL LOW (ref 36.0–46.0)
Hemoglobin: 9.4 g/dL — ABNORMAL LOW (ref 12.0–15.0)
MCH: 29.2 pg (ref 26.0–34.0)
MCHC: 32.4 g/dL (ref 30.0–36.0)
MCV: 90.1 fL (ref 78.0–100.0)
Platelets: 197 K/uL (ref 150–400)
RBC: 3.22 MIL/uL — ABNORMAL LOW (ref 3.87–5.11)
RDW: 16.2 % — ABNORMAL HIGH (ref 11.5–15.5)
WBC: 6.4 K/uL (ref 4.0–10.5)

## 2018-05-25 LAB — PROTIME-INR
INR: 0.99
Prothrombin Time: 13 seconds (ref 11.4–15.2)

## 2018-05-25 LAB — POC OCCULT BLOOD, ED: FECAL OCCULT BLD: NEGATIVE

## 2018-05-25 LAB — ABO/RH: ABO/RH(D): O POS

## 2018-05-25 LAB — TYPE AND SCREEN
ABO/RH(D): O POS
Antibody Screen: NEGATIVE

## 2018-05-25 MED ORDER — NAPROXEN 375 MG PO TABS: 375.0000 mg | ORAL_TABLET | Freq: Once | ORAL | Status: DC

## 2018-05-25 MED ORDER — NAPROXEN 375 MG PO TABS
375.0000 mg | ORAL_TABLET | Freq: Once | ORAL | Status: AC
  Administered 2018-05-26: 375 mg via ORAL
  Filled 2018-05-25: qty 1

## 2018-05-25 NOTE — ED Notes (Signed)
Blue top sent to lab. 

## 2018-05-25 NOTE — ED Notes (Signed)
Pt walked to bathroom and tolerated well.

## 2018-05-25 NOTE — ED Triage Notes (Signed)
Per EMS- Patient c/o abdominal pain and black tarry stools x months, but worse in the past few days. Patient reports that she has been trying to treat her pain with Tylenol. Patient states she has taken Tylenol 3000 mg today with no relief.

## 2018-05-25 NOTE — Discharge Instructions (Signed)

## 2018-05-25 NOTE — ED Notes (Signed)
Pt BIB [facility]. She states that they have her bag with her phone in it. No bag was left with patient. Communications called to try to locate belongings.

## 2018-05-25 NOTE — ED Notes (Signed)
Pt having [employer] sub that she had delivered to the lobby.

## 2018-05-25 NOTE — ED Provider Notes (Signed)
Emergency Department Provider Note   I have reviewed the triage vital signs and the nursing notes.   HISTORY  Chief Complaint Abdominal Pain and GI Bleeding   HPI Melissa Bridges is a 57 y.o. female with PMH of recent unintentional tylenol OD, COPD, GERD, Asthma, Hep C, substance abuse history, and seizures presents to the ED with continued RLQ abdominal pain and dark material in stools. States her pain is continued from her prior admission. She has take 3,000 mg of Tylenol for pain and tried topical pain relief with no success. No vaginal bleeding or discharge. No fever or chills. She notes intermittent "black" stools for "a while" but worsening recently. No BRBPR. No radiation of symptoms.    Past Medical History:  Diagnosis Date  . Allergy   . Alopecia   . Anxiety   . Arthritis   . Asthma   . Cataract    "mild"  . COPD (chronic obstructive pulmonary disease) ([place])   . Depression   . GERD (gastroesophageal reflux disease)    o cc- takes OTC if needed.  . Glaucoma   . Hepatitis C   . Hypertension    onset age 55.  . Iron deficiency anemia   . Migraine   . Schizoaffective disorder ([place])   . Sciatic pain   . Seizures ([place])    onset in childhood. 06-03-16- per pt 8 months agoGeneralized tonic-clonic.Last seizure couple of months ago- last sz 9-10 months ago per pt 10-12-2016  . Shortness of breath dyspnea   . Substance abuse ([place])   . Ulcer     Patient Active Problem List   Diagnosis Date Noted  . Cocaine abuse ([place]) 05/24/2018  . Hypomagnesemia 05/24/2018  . Chronic left hip pain 05/24/2018  . Hypokalemia 05/23/2018  . AKI (acute kidney injury) ([place]) 05/23/2018  . Tylenol overdose 05/23/2018  . COPD (chronic obstructive pulmonary disease) ([place]) 05/23/2018  . Serrated adenoma of colon 08/05/2016  . Localization-related idiopathic epilepsy and epileptic syndromes with seizures of localized onset, not intractable, without status epilepticus ([place]) 10/16/2015  .  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 Seizure disorder ([place]) 09/09/2014  . Asthma with acute exacerbation 03/09/2013  . Hypertension 03/09/2013  . Lower back pain 03/09/2013  . Chronic hepatitis C without hepatic coma ([place]) 03/08/2013  . Smoker 12/12/2012  . Schizoaffective disorder ([place]) 12/12/2012    Past Surgical History:  Procedure Laterality Date  . ABDOMINAL HYSTERECTOMY     ovarian cyst B, cervical dysplasia, fibroids.   Ovaries intact.  . COLONOSCOPY    . COLONOSCOPY W/ BIOPSIES    . DILATION AND CURETTAGE OF UTERUS     x2  . EXPLORATORY LAPAROTOMY     x3  . GYNECOLOGIC CRYOSURGERY     x3  . MANDIBLE RECONSTRUCTION N/A 06/27/2015   Procedure: REMOVAL MAXILLARY PALATAL TORUS.  REMOVAL MANDIBULAR TORUS AND EXOSTOSIS.  ;  Surgeon: Diona Browner, DDS;  Location: [facility];  Service: Oral Surgery;  Laterality: N/A;  . OVARIAN CYST REMOVAL  2008  . POLYPECTOMY       Allergies Fruit & vegetable daily [nutritional supplements] and Iodinated diagnostic agents  Family History  Problem Relation Age of Onset  . Diabetes type II Mother   . Hypertension Mother   . Arthritis Mother   . Diabetes Mother   . Mental illness Mother        bipolar  . Diabetes type II Maternal Aunt   . Hypertension Father   . Heart murmur Father   .  Arthritis Father   . Stroke Father   . Alopecia Sister   . Alopecia Brother   . Alopecia Sister   . Mental retardation Sister        depression  . Prostate cancer Paternal Grandfather   . Cancer Maternal Uncle   . Colon cancer Neg Hx   . Esophageal cancer Neg Hx   . Rectal cancer Neg Hx   . Stomach cancer Neg Hx   . Colon polyps Neg Hx     Social History Social History   Tobacco Use  . Smoking status: Current Every Day Smoker    Packs/day: 0.33    Years: 44.00    Pack years: 14.52    Types: Cigarettes  . Smokeless tobacco: Never Used  Substance Use Topics  . Alcohol use: Yes    Alcohol/week: 0.0 oz    Comment: occasionally  . Drug use:  Yes    Types: Marijuana    Review of Systems  Constitutional: No fever/chills Eyes: No visual changes. ENT: No sore throat. Cardiovascular: Denies chest pain. Respiratory: Denies shortness of breath. Gastrointestinal: Positive lower abdominal pain.  No nausea, no vomiting. Positive diarrhea.  No constipation. Positive black stools.  Genitourinary: Negative for dysuria. Musculoskeletal: Negative for back pain. Skin: Negative for rash. Neurological: Negative for headaches, focal weakness or numbness.  10-point ROS otherwise negative.  ____________________________________________   PHYSICAL EXAM:  VITAL SIGNS: ED Triage Vitals  Enc Vitals Group     BP 05/25/18 1821 (!) 154/79     Pulse Rate 05/25/18 1821 89     Resp 05/25/18 1821 16     Temp 05/25/18 1821 98.3 F (36.8 C)     Temp Source 05/25/18 1821 Oral     SpO2 05/25/18 1821 100 %     Weight 05/25/18 1825 113 lb (51.3 kg)     Height 05/25/18 1825 5\' 3"  (1.6 m)     Pain Score 05/25/18 1825 10   Constitutional: Alert and oriented. Well appearing and in no acute distress. Eyes: Conjunctivae are normal.  Head: Atraumatic. Nose: No congestion/rhinnorhea. Mouth/Throat: Mucous membranes are moist. Neck: No stridor.   Cardiovascular: Normal rate, regular rhythm. Good peripheral circulation. Grossly normal heart sounds.   Respiratory: Normal respiratory effort.  No retractions. Lungs CTAB. Gastrointestinal: Soft and nontender throughout including deep palpation in the RLQ. No distention. Rectal exam performed with nurse chaperone. Dark brown stool. No gross blood or melena.  Musculoskeletal: No lower extremity tenderness nor edema. No gross deformities of extremities. Neurologic:  Normal speech and language. No gross focal neurologic deficits are appreciated.  Skin:  Skin is warm, dry and intact. No rash noted.  ____________________________________________   LABS (all labs ordered are listed, but only abnormal results  are displayed)  Labs Reviewed  COMPREHENSIVE METABOLIC PANEL - Abnormal; Notable for the following components:      Result Value   Total Protein 8.2 (*)    AST 79 (*)    All other components within normal limits  CBC - Abnormal; Notable for the following components:   RBC 3.22 (*)    Hemoglobin 9.4 (*)    HCT 29.0 (*)    RDW 16.2 (*)    All other components within normal limits  URINALYSIS, ROUTINE W REFLEX MICROSCOPIC - Abnormal; Notable for the following components:   Hgb urine dipstick SMALL (*)    Ketones, ur 5 (*)    All other components within normal limits  PROTIME-INR  POC OCCULT BLOOD, ED  POC OCCULT BLOOD, ED  TYPE AND SCREEN  ABO/RH   ____________________________________________  RADIOLOGY  None ____________________________________________   PROCEDURES  Procedure(s) performed:   Procedures  None ____________________________________________   INITIAL IMPRESSION / ASSESSMENT AND PLAN / ED COURSE  Pertinent labs & imaging results that were available during my care of the patient were reviewed by me and considered in my medical decision making (see chart for details).  Patient presents to the ED with continued abdominal pain and black stool. Stool complaint seems more chronic but states she did not mention this during recent admission. She continues to take Tylenol but dose not > 4000 mg/24h. Advised she limit her Tylenol and possibly stop taking this all together given recent accidental overdose. Added PT/INR to labs which is normal. Liver enzymes normal. No RUQ pain. Hemoccult negative. Stool possibly dark due to iron supplementation.   At this time, I do not feel there is any life-threatening condition present. I have reviewed and discussed all results (EKG, imaging, lab, urine as appropriate), exam findings with patient. I have reviewed nursing notes and appropriate previous records.  I feel the patient is safe to be discharged home without further emergent  workup. Discussed usual and customary return precautions. Patient and family (if present) verbalize understanding and are comfortable with this plan.  Patient will follow-up with their primary care provider. If they do not have a primary care provider, information for follow-up has been provided to them. All questions have been answered.  ____________________________________________  FINAL CLINICAL IMPRESSION(S) / ED DIAGNOSES  Final diagnoses:  Lower abdominal pain     MEDICATIONS GIVEN DURING THIS VISIT:  Medications  naproxen (NAPROSYN) tablet 375 mg (375 mg Oral Given 05/26/18 0000)    Note:  This document was prepared using Dragon voice recognition software and may include unintentional dictation errors.  Nanda Quinton, MD Emergency Medicine    Esmirna Ravan, [facility], MD 05/26/18 0930

## 2018-05-25 NOTE — ED Notes (Signed)
Pt given juice and sandwich.

## 2018-05-26 ENCOUNTER — Other Ambulatory Visit: Payer: Self-pay

## 2018-05-26 ENCOUNTER — Ambulatory Visit (INDEPENDENT_AMBULATORY_CARE_PROVIDER_SITE_OTHER): Payer: 59 | Admitting: Urgent Care

## 2018-05-26 ENCOUNTER — Encounter: Payer: Self-pay | Admitting: Urgent Care

## 2018-05-26 VITALS — BP 184/95 | HR 100 | Temp 98.0°F | Resp 16 | Ht 63.0 in | Wt 108.0 lb

## 2018-05-26 DIAGNOSIS — G894 Chronic pain syndrome: Secondary | ICD-10-CM | POA: Diagnosis not present

## 2018-05-26 DIAGNOSIS — J441 Chronic obstructive pulmonary disease with (acute) exacerbation: Secondary | ICD-10-CM | POA: Diagnosis not present

## 2018-05-26 DIAGNOSIS — R1031 Right lower quadrant pain: Secondary | ICD-10-CM | POA: Diagnosis not present

## 2018-05-26 DIAGNOSIS — I10 Essential (primary) hypertension: Secondary | ICD-10-CM | POA: Diagnosis not present

## 2018-05-26 DIAGNOSIS — G40009 Localization-related (focal) (partial) idiopathic epilepsy and epileptic syndromes with seizures of localized onset, not intractable, without status epilepticus: Secondary | ICD-10-CM | POA: Diagnosis not present

## 2018-05-26 DIAGNOSIS — Z760 Encounter for issue of repeat prescription: Secondary | ICD-10-CM

## 2018-05-26 DIAGNOSIS — M5442 Lumbago with sciatica, left side: Secondary | ICD-10-CM

## 2018-05-26 DIAGNOSIS — F250 Schizoaffective disorder, bipolar type: Secondary | ICD-10-CM

## 2018-05-26 DIAGNOSIS — M25552 Pain in left hip: Secondary | ICD-10-CM | POA: Diagnosis not present

## 2018-05-26 DIAGNOSIS — G8929 Other chronic pain: Secondary | ICD-10-CM | POA: Diagnosis not present

## 2018-05-26 DIAGNOSIS — M7622 Iliac crest spur, left hip: Secondary | ICD-10-CM

## 2018-05-26 MED ORDER — AMLODIPINE BESYLATE 10 MG PO TABS: 10.0000 mg | ORAL_TABLET | Freq: Every day | ORAL | 0 refills | Status: DC

## 2018-05-26 MED ORDER — BUDESONIDE-FORMOTEROL FUMARATE 80-4.5 MCG/ACT IN AERO: 2.0000 | INHALATION_SPRAY | Freq: Two times a day (BID) | RESPIRATORY_TRACT | 0 refills | Status: DC

## 2018-05-26 MED ORDER — QUETIAPINE FUMARATE 300 MG PO TABS: 300.0000 mg | ORAL_TABLET | Freq: Every day | ORAL | 0 refills | Status: DC

## 2018-05-26 MED ORDER — CITALOPRAM HYDROBROMIDE 10 MG PO TABS: 10.0000 mg | ORAL_TABLET | Freq: Every day | ORAL | 0 refills | Status: DC

## 2018-05-26 MED ORDER — ALBUTEROL SULFATE HFA 108 (90 BASE) MCG/ACT IN AERS: INHALATION_SPRAY | RESPIRATORY_TRACT | 1 refills | Status: DC

## 2018-05-26 NOTE — Progress Notes (Signed)
MRN: [id number] DOB: Feb 03, 1961  Subjective:   Melissa Bridges is a 57 y.o. female presenting for medication refills.  Patient is requesting refills on her COPD, mental health, blood pressure, seizure medications.  Her PCP is Dr. Tamala Julian, states that she plans on setting the office visit for recheck with her in July or early August.  She has not had follow-up with Dr. Wynelle Cleveland who manages her seizures.  States that she has just requesting refills without setting of follow-up.  She states that she has been out of her Lamictal for months and has had 3 seizures in the past month.  Prior to that she was on Depakote and states that she was switched to Lamictal but wants to be switched back to Depakote.  She has also not seen Dr. Adele Schilder for her mental health in over a year.  She was scheduled to have GI consult for work-up of her persistent abdominal pain as set up by Dr. Tamala Julian, patient states that she missed her appointment and has not called to reschedule.  Denies headache, confusion, dizziness, chest pain.  She has ongoing and unchanged abdominal pain.  Patient is requesting a refill of opioid pain medication because this will came into her in the past.  However recent documentation states that this was refused in April by both Dr. Tamala Julian and Dr. Carlota Raspberry given that patient has not provided a clinic with a urine drug screen.  She has had a couple of ER visits this past month, had an accidental Tylenol overdose.  She was prescribed naproxen from the ER for her pain.  Melissa Bridges has a current medication list which includes the following prescription(s): albuterol, amlodipine, budesonide-formoterol, citalopram, diclofenac sodium, ferrous sulfate, gabapentin, gabapentin, ipratropium-albuterol, lamotrigine, methocarbamol, quetiapine, triamcinolone cream, and valacyclovir. Also is allergic to fruit & vegetable daily [nutritional supplements] and iodinated diagnostic agents.  Melissa Bridges  has a past medical history of Allergy,  Alopecia, Anxiety, Arthritis, Asthma, Cataract, COPD (chronic obstructive pulmonary disease) ([place]), Depression, GERD (gastroesophageal reflux disease), Glaucoma, Hepatitis C, Hypertension, Iron deficiency anemia, Migraine, Schizoaffective disorder ([place]), Sciatic pain, Seizures ([place]), Shortness of breath dyspnea, Substance abuse ([place]), and Ulcer. Also  has a past surgical history that includes Dilation and curettage of uterus; Exploratory laparotomy; Ovarian cyst removal (2008); Abdominal hysterectomy; Gynecologic cryosurgery; Mandible reconstruction (N/A, 06/27/2015); Colonoscopy w/ biopsies; Colonoscopy; and Polypectomy.  Objective:   Vitals: BP (!) 184/95   Pulse 100   Temp 98 F (36.7 C) (Oral)   Resp 16   Ht 5\' 3"  (1.6 m)   Wt 108 lb (49 kg)   SpO2 96%   BMI 19.13 kg/m   BP Readings from Last 3 Encounters:  05/26/18 (!) 184/95  05/25/18 (!) 175/92  05/24/18 133/90    Physical Exam  Constitutional: She is oriented to person, place, and time. She appears well-developed and well-nourished.  HENT:  Mouth/Throat: Oropharynx is clear and moist.  Eyes: Pupils are equal, round, and reactive to light. EOM are normal. Right eye exhibits no discharge. Left eye exhibits no discharge. No scleral icterus.  Cardiovascular: Normal rate, regular rhythm, normal heart sounds and intact distal pulses. Exam reveals no gallop and no friction rub.  No murmur heard. Pulmonary/Chest: No stridor. No respiratory distress. She has no wheezes. She has no rales.  Abdominal: Soft. Bowel sounds are normal. She exhibits no distension and no mass. There is tenderness (left sided). There is no rebound and no guarding.  Neurological: She is alert and oriented to person, place, and time.  She displays normal reflexes. No cranial nerve deficit.  Skin: Skin is warm and dry.  Psychiatric: She has a normal mood and affect.   Results for orders placed or performed during the hospital encounter of 05/25/18 (from the past  24 hour(s))  Comprehensive metabolic panel     Status: Abnormal   Collection Time: 05/25/18  6:42 PM  Result Value Ref Range   Sodium 137 135 - 145 mmol/L   Potassium 3.5 3.5 - 5.1 mmol/L   Chloride 102 101 - 111 mmol/L   CO2 25 22 - 32 mmol/L   Glucose, Bld 98 65 - 99 mg/dL   BUN 8 6 - 20 mg/dL   Creatinine, Ser 0.99 0.44 - 1.00 mg/dL   Calcium 9.9 8.9 - 10.3 mg/dL   Total Protein 8.2 (H) 6.5 - 8.1 g/dL   Albumin 4.4 3.5 - 5.0 g/dL   AST 79 (H) 15 - 41 U/L   ALT 44 14 - 54 U/L   Alkaline Phosphatase 51 38 - 126 U/L   Total Bilirubin 0.3 0.3 - 1.2 mg/dL   GFR calc non Af Amer >60 >60 mL/min   GFR calc Af Amer >60 >60 mL/min   Anion gap 10 5 - 15  CBC     Status: Abnormal   Collection Time: 05/25/18  6:42 PM  Result Value Ref Range   WBC 6.4 4.0 - 10.5 K/uL   RBC 3.22 (L) 3.87 - 5.11 MIL/uL   Hemoglobin 9.4 (L) 12.0 - 15.0 g/dL   HCT 29.0 (L) 36.0 - 46.0 %   MCV 90.1 78.0 - 100.0 fL   MCH 29.2 26.0 - 34.0 pg   MCHC 32.4 30.0 - 36.0 g/dL   RDW 16.2 (H) 11.5 - 15.5 %   Platelets 197 150 - 400 K/uL  ABO/Rh     Status: None   Collection Time: 05/25/18  6:42 PM  Result Value Ref Range   ABO/RH(D)      O POS Performed at [facility], [place] [address], [place], [place] [address]   Type and screen [place]     Status: None   Collection Time: 05/25/18  6:44 PM  Result Value Ref Range   ABO/RH(D) O POS    Antibody Screen NEG    Sample Expiration      05/28/2018 Performed at [facility], [place] [address], [place], [place] [address]   Protime-INR     Status: None   Collection Time: 05/25/18  9:33 PM  Result Value Ref Range   Prothrombin Time 13.0 11.4 - 15.2 seconds   INR 0.99   Urinalysis, Routine w reflex microscopic     Status: Abnormal   Collection Time: 05/25/18  9:42 PM  Result Value Ref Range   Color, Urine YELLOW YELLOW   APPearance CLEAR CLEAR   Specific Gravity, Urine 1.010 1.005 - 1.030   pH 6.0 5.0 -  8.0   Glucose, UA NEGATIVE NEGATIVE mg/dL   Hgb urine dipstick SMALL (A) NEGATIVE   Bilirubin Urine NEGATIVE NEGATIVE   Ketones, ur 5 (A) NEGATIVE mg/dL   Protein, ur NEGATIVE NEGATIVE mg/dL   Nitrite NEGATIVE NEGATIVE   Leukocytes, UA NEGATIVE NEGATIVE   RBC / HPF 0-5 0 - 5 RBC/hpf   WBC, UA 0-5 0 - 5 WBC/hpf   Bacteria, UA NONE SEEN NONE SEEN   Squamous Epithelial / LPF 0-5 0 - 5  POC occult blood, ED     Status:  None   Collection Time: 05/25/18  9:43 PM  Result Value Ref Range   Fecal Occult Bld NEGATIVE NEGATIVE   Assessment and Plan :   Medication refill  Schizoaffective disorder, bipolar type ([place]) - Plan: QUEtiapine (SEROQUEL) 300 MG tablet, citalopram (CELEXA) 10 MG tablet  COPD exacerbation ([facility]) - Plan: budesonide-formoterol (SYMBICORT) 80-4.5 MCG/ACT inhaler  Localization-related idiopathic epilepsy and epileptic syndromes with seizures of localized onset, not intractable, without status epilepticus ([place])  Essential hypertension  Left hip pain  Chronic left-sided low back pain with left-sided sciatica  Iliac crest spur of left hip  Chronic pain syndrome  Patient states that she was never denied any medication for her pain.  I counseled that it was communicated to we could not refill her opiate pain medication because she has not had consistent follow-up and has not provided a urine drug screen.  Counseled that she can take the naproxen that was prescribed to her from her most recent ER visit.  She is to  reschedule her appointment with GI clinic for consult.  Counseled that she needs to schedule an appointment for follow-up with her mental health and seizure physicians.  I counseled that I am not comfortable changing her seizure medications and I am okay with refilling what she was last prescribed.  However, I did point out that Dr. Wynelle Cleveland just sent a refill and recommended she pick this up.  Provided her with a 52-month supply to bridge her appointment with her PCP,  Dr. Tamala Julian.  Jaynee Eagles, PA-C [facility] [phone] 05/26/2018  1:55 PM

## 2018-05-26 NOTE — Patient Instructions (Addendum)
Please schedule follow up with Dr. Adele Schilder for continued management of your mental health. Please follow up with Dr. Debbe Odea for continued management of your seizures. Schedule follow up with Dr. Tamala Julian, your PCP, discuss plans going forward for your future PCP. Please call the GI doctor that you are supposed to see for consult on your abdominal pain, they will set up another appointment for you to figure out why you are having the belly pain.      IF you received an x-ray today, you will receive an invoice from [facility]. Please contact [facility] at [phone] with questions or concerns regarding your invoice.   IF you received labwork today, you will receive an invoice from [facility]. Please contact [facility] at [phone] with questions or concerns regarding your invoice.   Our billing staff will not be able to assist you with questions regarding bills from these companies.  You will be contacted with the lab results as soon as they are available. The fastest way to get your results is to activate your My Chart account. Instructions are located on the last page of this paperwork. If you have not heard from [place] regarding the results in 2 weeks, please contact this office.

## 2018-05-29 ENCOUNTER — Telehealth: Payer: Self-pay | Admitting: Family Medicine

## 2018-05-29 NOTE — Telephone Encounter (Signed)
Copied from [place] [id number]. Topic: Referral - Request >> May 29, 2018 12:17 PM Cecelia Byars, NT wrote: Reason for CRM: Patient is requesting a referral to see  gastro ,due to having severe stomach pain , ulcers ,burning when she eats please  call her at [phone]

## 2018-05-31 ENCOUNTER — Other Ambulatory Visit: Payer: Self-pay | Admitting: *Deleted

## 2018-05-31 DIAGNOSIS — R109 Unspecified abdominal pain: Secondary | ICD-10-CM

## 2018-05-31 NOTE — Telephone Encounter (Signed)
Left message for patient to call back  A referral has been put in for GI it can take up to 2 weeks for them to call her.

## 2018-06-07 NOTE — Progress Notes (Signed)
Subjective:    Patient ID: Melissa Bridges, female    DOB: 08-15-1961, 57 y.o.   MRN: [id number]  06/13/2018  Sciatica (been going on for 6 months in left side ) and Medication Refill (kenolog cream )    HPI This 57 y.o. female presents for hospital follow-up for TYLENOL OVERDOSE.    Admit date: 05/23/2018 Discharge date: 05/24/2018 Admitted From: Home Disposition: Home Recommendations for Outpatient Follow-up:  1. Primary care provider to ensure that patient has sufficient prescriptions 2. Patient may need ongoing case management assistance to ensure she has transportation to her PCP office 3. Patient may need referral back to her orthopedic doctor for left hip pain  Discharge Condition:   Stable CODE STATUS:   Full code Consultations:  None Discharge Diagnoses:  Principal Problem:   Tylenol overdose Active Problems:   Hypokalemia   AKI (acute kidney injury) (HCC)   Cocaine abuse (HCC)   Hypomagnesemia   Smoker   Schizoaffective disorder (HCC)   Chronic hepatitis C without hepatic coma (HCC)   Hypertension   Lower back pain   Seizure disorder (HCC)   COPD (chronic obstructive pulmonary disease) (HCC)   Chronic left hip pain  Brief Summary: Melissa Mathisis a 57 y.o.femalewith medical history significant ofhypertension, seizure affective disorder, hepatitis C, COPD, seizure disorder, nicotine abuse, alcohol abuse chronic back pain who presented to the emergency department with complaints of abdominal pain. Patient reported that she has chronic back pain and she took 20 pills of 500 mg Tylenol to relieve the pain. She started having abdominal pain nausea and vomitingand she presented to the emergency department. She states that due to transportation issues she has not been back to see her primary care physician in over a year and therefore did not have refills on many of her medications.  Other refills were obtained through an urgent care or ED. Tylenollevel  wasfound to be elevated at 269 Hospital Course:  Unintentional Tylenol overdose -Started on Mucomyst - Tylenol level today is less than 10 and poison control has recommended for Mucomyst to be discontinued -She has been advised not to take excess Tylenol again AKI -Creatinine 1.22 on admission has improved to 0.76 Hypokalemia and Hypomagnesemia - treated and resolved-potassium has improved from 2.9-3.7 and magnesium has improved from 1.5-1.8 Chronic pain- left hip and lower back pain -Has not been to see her doctor in over a year and therefore was mainly taking Neurontin 300 mg 3 times a day (her prescription states BID but she states she takes it TID), Robaxin 500 mg 3 times a day, at least 6 Aleve a day and excess Tylenol as mentioned above - She states she previously saw an orthopedic surgeon for the left hip and was told that she had a bone spur- xrays from 4/29 show osteoarthritis- it hurts worse the more she walks - She states that if given prescriptions, will be able to obtain medications - I will prescribe a slightly higher dose of Neurontin of 400 mg 3 times daily, double the Robaxin, and add Voltaren gel, she can take Tylenol as directed on bottle - she will need to avoid NSAIDS for now as she has had vomiting (? Gastritis) - avoid narcotics in setting of Cocaine use - cannot use Ultram in setting of seizures - will get her back in with her previous PCP, Dr Silva Bandy Thurston Brendlinger-I have obtained an appointment for the PA   - she had a pain contract with her in the past, per notes- she  can subsequently be referred back to ortho for her pain Iron deficiency anemia -Hemoglobin 8.9 today-I have given her prescription for the iron that was previously prescribed to her Cocaine abuse I have discussed with her the risks of cocaine use and cessation Transportation issues - case management to help her arrange transportation- she should not drive due to last seizure being ~ 1 mo ago Seizure  disorder -Ran out of Lamictal- last seizure was a few wks ago because of not having medications - Lamictal resumed at 25 mg daily to be titrated back up to 50 mg daily Schizoaffective disorder  -Continue citalopram and Seroquel which she has been taking at home Nicotine abuse 1 pack of cigarettes last for about 3 days-she has cut back from 2 packs/day COPD - she she has nebulizer treatments and Symbicort at home w is Dr. Butler Denmark can I speak with 11's nurse hich she has been using   [facility] DISCHARGE: DDD lumbar: s/p lower back specialist follow-up; does not recommend lumbar surgery due to seizure disorder; recommend referral to pain specialist.    L hip pain: s/p orthopedic consultation; no injection recommended; previous back injections with swelling.   Took steroids out for injection yet reacted to contrast.  Schizoaffective disorder: went to [facility] for a while; niece stays there; staying with nice for four months; lease will be up in July 11, 2018; plans to live with niece. Renting month to month.  HTN: non-compliant with Amlodipine prior to admission ;increase to Amlodipine 10mg  daily during admission.  Seizure disorder: Antonio; has not seen neurology in a while; non-compliant with medication.     BP Readings from Last 3 Encounters:  06/13/18 118/72  05/26/18 (!) 184/95  05/25/18 (!) 175/92   Wt Readings from Last 3 Encounters:  06/13/18 106 lb 9.6 oz (48.4 kg)  05/26/18 108 lb (49 kg)  05/25/18 113 lb (51.3 kg)   Immunization History  Administered Date(s) Administered  . Hepatitis A, Adult 05/11/2016, 04/06/2017  . Influenza,inj,Quad PF,6+ Mos 09/29/2015, 09/04/2016, 09/02/2017  . Pneumococcal Polysaccharide-23 11/12/2011  . Tdap 05/11/2016    Review of Systems  Constitutional: Negative for activity change, appetite change, chills, diaphoresis, fatigue, fever and unexpected weight change.  HENT: Negative for congestion, dental problem, drooling, ear  discharge, ear pain, facial swelling, hearing loss, mouth sores, nosebleeds, postnasal drip, rhinorrhea, sinus pressure, sneezing, sore throat, tinnitus, trouble swallowing and voice change.   Eyes: Negative for photophobia, pain, discharge, redness, itching and visual disturbance.  Respiratory: Negative for apnea, cough, choking, chest tightness, shortness of breath, wheezing and stridor.   Cardiovascular: Negative for chest pain, palpitations and leg swelling.  Gastrointestinal: Negative for abdominal distention, abdominal pain, anal bleeding, blood in stool, constipation, diarrhea, nausea, rectal pain and vomiting.  Endocrine: Negative for cold intolerance, heat intolerance, polydipsia, polyphagia and polyuria.  Genitourinary: Negative for decreased urine volume, difficulty urinating, dyspareunia, dysuria, enuresis, flank pain, frequency, genital sores, hematuria, menstrual problem, pelvic pain, urgency, vaginal bleeding, vaginal discharge and vaginal pain.  Musculoskeletal: Positive for back pain. Negative for arthralgias, gait problem, joint swelling, myalgias, neck pain and neck stiffness.  Skin: Negative for color change, pallor, rash and wound.  Allergic/Immunologic: Negative for environmental allergies, food allergies and immunocompromised state.  Neurological: Negative for dizziness, tremors, seizures, syncope, facial asymmetry, speech difficulty, weakness, light-headedness, numbness and headaches.  Hematological: Negative for adenopathy. Does not bruise/bleed easily.  Psychiatric/Behavioral: Negative for agitation, behavioral problems, confusion, decreased concentration, dysphoric mood, hallucinations, self-injury, sleep disturbance and suicidal ideas.  The patient is not nervous/anxious and is not hyperactive.     Past Medical History:  Diagnosis Date  . Allergy   . Alopecia   . Anxiety   . Arthritis   . Asthma   . Cataract    "mild"  . COPD (chronic obstructive pulmonary disease)  (HCC)   . Depression   . GERD (gastroesophageal reflux disease)    o cc- takes OTC if needed.  . Glaucoma   . Hepatitis C   . Hypertension    onset age 52.  . Iron deficiency anemia   . Migraine   . Schizoaffective disorder (HCC)   . Sciatic pain   . Seizures (HCC)    onset in childhood. 06-03-16- per pt 8 months agoGeneralized tonic-clonic.Last seizure couple of months ago- last sz 9-10 months ago per pt 10-12-2016  . Shortness of breath dyspnea   . Substance abuse (HCC)   . Ulcer    Past Surgical History:  Procedure Laterality Date  . ABDOMINAL HYSTERECTOMY     ovarian cyst B, cervical dysplasia, fibroids.   Ovaries intact.  . COLONOSCOPY    . COLONOSCOPY W/ BIOPSIES    . DILATION AND CURETTAGE OF UTERUS     x2  . EXPLORATORY LAPAROTOMY     x3  . GYNECOLOGIC CRYOSURGERY     x3  . MANDIBLE RECONSTRUCTION N/A 06/27/2015   Procedure: REMOVAL MAXILLARY PALATAL TORUS.  REMOVAL MANDIBULAR TORUS AND EXOSTOSIS.  ;  Surgeon: Ocie Doyne, DDS;  Location: [facility] OR;  Service: Oral Surgery;  Laterality: N/A;  . OVARIAN CYST REMOVAL  2008  . POLYPECTOMY     Allergies  Allergen Reactions  . Fruit & Vegetable Daily [Nutritional Supplements] Shortness Of Breath    Aloe  . Iodinated Diagnostic Agents Swelling    Swelling and itching of left side of her face only after CT SI injection(no steroid used)   Current Outpatient Medications on File Prior to Visit  Medication Sig Dispense Refill  . albuterol (VENTOLIN HFA) 108 (90 Base) MCG/ACT inhaler INHALE TWO PUFFS BY MOUTH EVERY 6 HOURS AS NEEDED FOR SHORTNESS OF BREATH 18 g 1  . citalopram (CELEXA) 10 MG tablet Take 1 tablet (10 mg total) by mouth daily. For depression 90 tablet 0  . diclofenac sodium (VOLTAREN) 1 % GEL Apply 2 g topically 4 (four) times daily. 1 Tube 0  . ipratropium-albuterol (DUONEB) 0.5-2.5 (3) MG/3ML SOLN Take 3 mLs by nebulization every 4 (four) hours as needed (shortness of breath). Reported on 12/02/2015    .  LamoTRIgine (LAMICTAL XR) 25 MG TB24 24 hour tablet take 1 tablet daily for 2 weeks, then increase to 2 tablets daily for 2 weeks 45 tablet 0  . methocarbamol (ROBAXIN) 500 MG tablet Take 2 tablets (1,000 mg total) by mouth every 8 (eight) hours as needed for muscle spasms. 60 tablet 0  . QUEtiapine (SEROQUEL) 300 MG tablet Take 1 tablet (300 mg total) by mouth at bedtime. Mood control 90 tablet 0  . triamcinolone cream (KENALOG) 0.1 % APPLY  CREAM EXTERNALLY TWICE DAILY 30 g 0  . valACYclovir (VALTREX) 1000 MG tablet Take 2 tabs po at symptom onset, repeat in 12 hours (Patient taking differently: Take 2,000 mg by mouth daily as needed (flare ups). ) 20 tablet 3   No current facility-administered medications on file prior to visit.    Social History   Socioeconomic History  . Marital status: Widowed    Spouse name: Not on file  .  Number of children: 1  . Years of education: Not on file  . Highest education level: Not on file  Occupational History  . Occupation: disabled    Comment: mental illness; seizures  Social Needs  . Financial resource strain: Not on file  . Food insecurity:    Worry: Not on file    Inability: Not on file  . Transportation needs:    Medical: Not on file    Non-medical: Not on file  Tobacco Use  . Smoking status: Current Every Day Smoker    Packs/day: 0.33    Years: 44.00    Pack years: 14.52    Types: Cigarettes  . Smokeless tobacco: Never Used  Substance and Sexual Activity  . Alcohol use: Yes    Alcohol/week: 0.0 oz    Comment: occasionally  . Drug use: Yes    Types: Marijuana  . Sexual activity: Not Currently    Birth control/protection: None    Comment: widow  Lifestyle  . Physical activity:    Days per week: Not on file    Minutes per session: Not on file  . Stress: Not on file  Relationships  . Social connections:    Talks on phone: Not on file    Gets together: Not on file    Attends religious service: Not on file    Active member of  club or organization: Not on file    Attends meetings of clubs or organizations: Not on file    Relationship status: Not on file  . Intimate partner violence:    Fear of current or ex partner: Not on file    Emotionally abused: Not on file    Physically abused: Not on file    Forced sexual activity: Not on file  Other Topics Concern  . Not on file  Social History Narrative   Marital status:  Widowed since 2002.  Married x 16 years. + dating.  Moved from [place] to live with daughter in 2013.     Children:  One child/daughter (33); two grandchildren.      Lives: with daughter, grandchildren 2.  Does not drive due to epilepsy.      Employment:  Disability for schizoaffective disorder.      Tobacco: 1 ppd x since 8th grade.      Alcohol:  Social; rare drinking due to seizure medications.  Weekends.       Drugs: none; previous use of marijuana.  Previous iv drug use, cocaine.      Exercise: none      Seatbelt:  100%      Guns: none   Family History  Problem Relation Age of Onset  . Diabetes type II Mother   . Hypertension Mother   . Arthritis Mother   . Diabetes Mother   . Mental illness Mother        bipolar  . Diabetes type II Maternal Aunt   . Hypertension Father   . Heart murmur Father   . Arthritis Father   . Stroke Father   . Alopecia Sister   . Alopecia Brother   . Alopecia Sister   . Mental retardation Sister        depression  . Prostate cancer Paternal Grandfather   . Cancer Maternal Uncle   . Colon cancer Neg Hx   . Esophageal cancer Neg Hx   . Rectal cancer Neg Hx   . Stomach cancer Neg Hx   . Colon polyps Neg Hx  Objective:    BP 118/72   Pulse 88   Temp 98.9 F (37.2 C)   Resp 16   Ht 5\' 3"  (1.6 m)   Wt 106 lb 9.6 oz (48.4 kg)   SpO2 99%   BMI 18.88 kg/m  Physical Exam  Constitutional: She is oriented to person, place, and time. She appears well-developed and well-nourished. No distress.  Well-dressed.  Well-groomed.  HENT:  Head:  Normocephalic and atraumatic.  Right Ear: External ear normal.  Left Ear: External ear normal.  Nose: Nose normal.  Mouth/Throat: Oropharynx is clear and moist.  Eyes: Pupils are equal, round, and reactive to light. Conjunctivae and EOM are normal.  Neck: Normal range of motion. Neck supple. Carotid bruit is not present. No thyromegaly present.  Cardiovascular: Normal rate, regular rhythm, normal heart sounds and intact distal pulses. Exam reveals no gallop and no friction rub.  No murmur heard. Pulmonary/Chest: Effort normal and breath sounds normal. She has no wheezes. She has no rales.  Abdominal: Soft. Bowel sounds are normal. She exhibits no distension and no mass. There is no tenderness. There is no rebound and no guarding.  Musculoskeletal:       Left hip: She exhibits decreased range of motion, tenderness and bony tenderness. She exhibits normal strength.       Lumbar back: She exhibits decreased range of motion and tenderness.  Lymphadenopathy:    She has no cervical adenopathy.  Neurological: She is alert and oriented to person, place, and time. No cranial nerve deficit.  Skin: Skin is warm and dry. No rash noted. She is not diaphoretic. No erythema. No pallor.  Psychiatric: She has a normal mood and affect. Her behavior is normal.   No results found. Depression screen [facility] 2/9 06/13/2018 06/13/2018 05/26/2018 09/02/2017 07/29/2017  Decreased Interest 0 0 0 1 1  Down, Depressed, Hopeless 0 0 0 1 1  PHQ - 2 Score 0 0 0 2 2  Altered sleeping - - - 0 0  Tired, decreased energy - - - 1 3  Change in appetite - - - 0 0  Feeling bad or failure about yourself  - - - - 2  Trouble concentrating - - - 0 1  Moving slowly or fidgety/restless - - - 0 1  Suicidal thoughts - - - 0 0  PHQ-9 Score - - - 3 9  Difficult doing work/chores - - - - Somewhat difficult  Some recent data might be hidden   Fall Risk  06/13/2018 06/13/2018 05/26/2018 09/02/2017 09/02/2017  Falls in the past year? No No Yes Yes  Yes  Number falls in past yr: - - 2 or more 2 or more 2 or more  Comment - - - - -  Injury with Fall? - - Yes - -  Follow up - - - - -        Assessment & Plan:   1. Acetaminophen abuse   2. Chronic right-sided low back pain with right-sided sciatica   3. Essential hypertension   4. Lumbar disc herniation   5. Pure hypercholesterolemia   6. Schizoaffective disorder, bipolar type (HCC)   7. Panlobular emphysema (HCC)   8. Chronic hepatitis C without hepatic coma (HCC)   9. Seizure disorder (HCC)   10. AKI (acute kidney injury) (HCC)   11. Smoker   12. Hypomagnesemia   13. Hypokalemia   14. Cocaine abuse (HCC)   15. Chronic left hip pain     Acetaminophen overdose: New  onset.  Nonintentional.  Patient has been taking an excessive amount of Tylenol products to control hip and lower back pain.  She admits to ongoing excessive use of Tylenol products since hospital discharge.  Reviewed again the patient must limit Tylenol to 4 g/day.  She is not a candidate for opiate therapy due to cocaine abuse.  Patient reports she is using cocaine for pain management yet I advised patient that her pain does not control pain.  She cannot use tramadol due to seizure disorder.  Continue gabapentin and Robaxin therapies.  Refer to pain management.  Patient has had a reaction to contrast with injections in her back in the past.  Chronic right-sided lower back pain with sciatica: Status post recent evaluation by back specialist.  She is not a surgical candidate due to seizure disorder.  She has had a reaction to injections in the past.  Chronic left hip pain: Status post evaluation by orthopedist recently.  No injection offered to patient.  Schizoaffective disorder: No recent follow-up with psychiatry due to transportation issues.  Will make referral for assistance with transportation.  Patient cannot drive due to seizure disorder.  Last appointment with Dr. Lolly Mustache in August 2018.  Seizure disorder:  Noncompliant with follow-up with neurology due to transportation issues.  Has had recent seizure due to not taking medications.  Polysubstance abuse: With recent ongoing cocaine abuse.  Encourage patient to avoid illicit drugs due to multiple comorbidities.  Patient is contemplative at this time.  Poor social support.  Patient is currently living alone in an apartment complex with similar people with mental illness.  Iron deficiency anemia: Worsening during admission.  Prescription for ferrous sulfate provided it causes constipation.  Patient is up-to-date on colonoscopy in June 2017 and underwent flexible sigmoidoscopy in November 2017.  She has suffered with overuse of NSAIDs causing gastritis.  Concern for peptic ulcer disease.  Has upcoming appointment with GI yet no appointment in the system.  Noncompliance:  patient reports medical non-compliance due to poor transportation.  I have provided her with recommendations for a primary care office in close proximity to her apartment.   Will refer to [facility] care management.   Orders Placed This Encounter  Procedures  . CBC with Differential/Platelet  . Comprehensive metabolic panel    Order Specific Question:   Has the patient fasted?    Answer:   No  . Lipid panel    Order Specific Question:   Has the patient fasted?    Answer:   No  . Ambulatory referral to Pain Clinic    Referral Priority:   Routine    Referral Type:   Consultation    Referral Reason:   Specialty Services Required    Requested Specialty:   Pain Medicine    Number of Visits Requested:   1  . AMB Referral to [facility] Care Management    Referral Priority:   Routine    Referral Type:   Consultation    Referral Reason:   [facility]-Care Management    Number of Visits Requested:   1  . POCT urinalysis dipstick   Meds ordered this encounter  Medications  . amLODipine (NORVASC) 10 MG tablet    Sig: Take 1 tablet (10 mg total) by mouth daily.    Dispense:  90 tablet    Refill:  1  .  budesonide-formoterol (SYMBICORT) 80-4.5 MCG/ACT inhaler    Sig: Inhale 2 puffs into the lungs 2 (two) times daily.    Dispense:  3 Inhaler    Refill:  1  . ferrous sulfate 325 (65 FE) MG tablet    Sig: Take 1 tablet (325 mg total) by mouth daily with breakfast.    Dispense:  90 tablet    Refill:  1  . gabapentin (NEURONTIN) 400 MG capsule    Sig: Take 1 capsule (400 mg total) by mouth 3 (three) times daily.    Dispense:  270 capsule    Refill:  1    No follow-ups on file.   Lonni Dirden Paulita Fujita, M.D. Primary Care at [facility] previously [facility] [address] [place], [place]  [phone] phone [phone] fax

## 2018-06-13 ENCOUNTER — Ambulatory Visit (INDEPENDENT_AMBULATORY_CARE_PROVIDER_SITE_OTHER): Payer: 59 | Admitting: Family Medicine

## 2018-06-13 ENCOUNTER — Encounter: Payer: Self-pay | Admitting: Family Medicine

## 2018-06-13 ENCOUNTER — Other Ambulatory Visit: Payer: Self-pay

## 2018-06-13 VITALS — BP 118/72 | HR 88 | Temp 98.9°F | Resp 16 | Ht 63.0 in | Wt 106.6 lb

## 2018-06-13 DIAGNOSIS — F17200 Nicotine dependence, unspecified, uncomplicated: Secondary | ICD-10-CM

## 2018-06-13 DIAGNOSIS — M25552 Pain in left hip: Secondary | ICD-10-CM

## 2018-06-13 DIAGNOSIS — M5126 Other intervertebral disc displacement, lumbar region: Secondary | ICD-10-CM | POA: Diagnosis not present

## 2018-06-13 DIAGNOSIS — B182 Chronic viral hepatitis C: Secondary | ICD-10-CM | POA: Diagnosis not present

## 2018-06-13 DIAGNOSIS — F250 Schizoaffective disorder, bipolar type: Secondary | ICD-10-CM

## 2018-06-13 DIAGNOSIS — M5441 Lumbago with sciatica, right side: Secondary | ICD-10-CM | POA: Diagnosis not present

## 2018-06-13 DIAGNOSIS — J431 Panlobular emphysema: Secondary | ICD-10-CM

## 2018-06-13 DIAGNOSIS — G8929 Other chronic pain: Secondary | ICD-10-CM

## 2018-06-13 DIAGNOSIS — N179 Acute kidney failure, unspecified: Secondary | ICD-10-CM

## 2018-06-13 DIAGNOSIS — E7800 Pure hypercholesterolemia, unspecified: Secondary | ICD-10-CM

## 2018-06-13 DIAGNOSIS — G40909 Epilepsy, unspecified, not intractable, without status epilepticus: Secondary | ICD-10-CM | POA: Diagnosis not present

## 2018-06-13 DIAGNOSIS — F558 Abuse of other non-psychoactive substances: Secondary | ICD-10-CM

## 2018-06-13 DIAGNOSIS — I10 Essential (primary) hypertension: Secondary | ICD-10-CM

## 2018-06-13 DIAGNOSIS — E876 Hypokalemia: Secondary | ICD-10-CM

## 2018-06-13 DIAGNOSIS — F1410 Cocaine abuse, uncomplicated: Secondary | ICD-10-CM

## 2018-06-13 LAB — POCT URINALYSIS DIP (MANUAL ENTRY)
Bilirubin, UA: NEGATIVE
GLUCOSE UA: NEGATIVE mg/dL
Ketones, POC UA: NEGATIVE mg/dL
LEUKOCYTES UA: NEGATIVE
Nitrite, UA: NEGATIVE
PROTEIN UA: NEGATIVE mg/dL
Spec Grav, UA: 1.01 (ref 1.010–1.025)
UROBILINOGEN UA: 0.2 U/dL
pH, UA: 7 (ref 5.0–8.0)

## 2018-06-13 MED ORDER — FERROUS SULFATE 325 (65 FE) MG PO TABS: 325.0000 mg | ORAL_TABLET | Freq: Every day | ORAL | 1 refills | Status: DC

## 2018-06-13 MED ORDER — GABAPENTIN 400 MG PO CAPS: 400.0000 mg | ORAL_CAPSULE | Freq: Three times a day (TID) | ORAL | 1 refills | Status: DC

## 2018-06-13 MED ORDER — AMLODIPINE BESYLATE 10 MG PO TABS: 10.0000 mg | ORAL_TABLET | Freq: Every day | ORAL | 1 refills | Status: DC

## 2018-06-13 MED ORDER — BUDESONIDE-FORMOTEROL FUMARATE 80-4.5 MCG/ACT IN AERO: 2.0000 | INHALATION_SPRAY | Freq: Two times a day (BID) | RESPIRATORY_TRACT | 1 refills | Status: DC

## 2018-06-13 NOTE — Patient Instructions (Addendum)
   Call Dr. Lanice Shirts office on [address] for an appointment.  [phone]  IF you received an x-ray today, you will receive an invoice from [facility]. Please contact [facility] at [phone] with questions or concerns regarding your invoice.   IF you received labwork today, you will receive an invoice from [facility]. Please contact [facility] at [phone] with questions or concerns regarding your invoice.   Our billing staff will not be able to assist you with questions regarding bills from these companies.  You will be contacted with the lab results as soon as they are available. The fastest way to get your results is to activate your My Chart account. Instructions are located on the last page of this paperwork. If you have not heard from [place] regarding the results in 2 weeks, please contact this office.

## 2018-06-14 ENCOUNTER — Telehealth: Payer: Self-pay | Admitting: *Deleted

## 2018-06-14 LAB — CBC WITH DIFFERENTIAL/PLATELET
BASOS ABS: 0 10*3/uL (ref 0.0–0.2)
Basos: 0 %
EOS (ABSOLUTE): 0.1 10*3/uL (ref 0.0–0.4)
EOS: 2 %
HEMATOCRIT: 29.1 % — AB (ref 34.0–46.6)
HEMOGLOBIN: 9.6 g/dL — AB (ref 11.1–15.9)
IMMATURE GRANS (ABS): 0 10*3/uL (ref 0.0–0.1)
Immature Granulocytes: 0 %
LYMPHS: 15 %
Lymphocytes Absolute: 0.7 10*3/uL (ref 0.7–3.1)
MCH: 28.9 pg (ref 26.6–33.0)
MCHC: 33 g/dL (ref 31.5–35.7)
MCV: 88 fL (ref 79–97)
MONOCYTES: 11 %
Monocytes Absolute: 0.5 10*3/uL (ref 0.1–0.9)
Neutrophils Absolute: 3.5 10*3/uL (ref 1.4–7.0)
Neutrophils: 72 %
Platelets: 177 10*3/uL (ref 150–450)
RBC: 3.32 x10E6/uL — ABNORMAL LOW (ref 3.77–5.28)
RDW: 20 % — ABNORMAL HIGH (ref 12.3–15.4)
WBC: 4.8 10*3/uL (ref 3.4–10.8)

## 2018-06-14 LAB — LIPID PANEL
CHOLESTEROL TOTAL: 172 mg/dL (ref 100–199)
Chol/HDL Ratio: 4.3 ratio (ref 0.0–4.4)
HDL: 40 mg/dL (ref >39)
LDL Calculated: 109 mg/dL — ABNORMAL HIGH (ref 0–99)
TRIGLYCERIDES: 113 mg/dL (ref 0–149)
VLDL CHOLESTEROL CAL: 23 mg/dL (ref 5–40)

## 2018-06-14 LAB — COMPREHENSIVE METABOLIC PANEL
ALBUMIN: 4.3 g/dL (ref 3.5–5.5)
ALK PHOS: 89 IU/L (ref 39–117)
ALT: 472 IU/L — ABNORMAL HIGH (ref 0–32)
AST: 519 IU/L — AB (ref 0–40)
Albumin/Globulin Ratio: 1.3 (ref 1.2–2.2)
BILIRUBIN TOTAL: 0.9 mg/dL (ref 0.0–1.2)
BUN / CREAT RATIO: 6 — AB (ref 9–23)
BUN: 5 mg/dL — ABNORMAL LOW (ref 6–24)
CALCIUM: 9.9 mg/dL (ref 8.7–10.2)
CHLORIDE: 94 mmol/L — AB (ref 96–106)
CO2: 24 mmol/L (ref 20–29)
Creatinine, Ser: 0.78 mg/dL (ref 0.57–1.00)
GFR calc Af Amer: 96 mL/min/{1.73_m2} (ref >59)
GFR calc non Af Amer: 83 mL/min/{1.73_m2} (ref >59)
GLUCOSE: 92 mg/dL (ref 65–99)
Globulin, Total: 3.2 g/dL (ref 1.5–4.5)
Potassium: 3.2 mmol/L — ABNORMAL LOW (ref 3.5–5.2)
Sodium: 136 mmol/L (ref 134–144)
TOTAL PROTEIN: 7.5 g/dL (ref 6.0–8.5)

## 2018-06-14 NOTE — Telephone Encounter (Signed)
'  Otila Kluver' from [employer] called to report pt's  AST 519. NT called practice, [place] aware.

## 2018-06-18 NOTE — Telephone Encounter (Signed)
Advised of lab results 

## 2018-06-26 ENCOUNTER — Encounter: Payer: Self-pay | Admitting: Family Medicine

## 2018-06-26 ENCOUNTER — Ambulatory Visit (INDEPENDENT_AMBULATORY_CARE_PROVIDER_SITE_OTHER): Payer: 59 | Admitting: Family Medicine

## 2018-06-26 VITALS — BP 102/70 | HR 108 | Temp 98.7°F | Resp 16 | Ht 64.57 in | Wt 105.0 lb

## 2018-06-26 DIAGNOSIS — M791 Myalgia, unspecified site: Secondary | ICD-10-CM | POA: Diagnosis not present

## 2018-06-26 DIAGNOSIS — M25562 Pain in left knee: Secondary | ICD-10-CM

## 2018-06-26 DIAGNOSIS — B182 Chronic viral hepatitis C: Secondary | ICD-10-CM

## 2018-06-26 DIAGNOSIS — F250 Schizoaffective disorder, bipolar type: Secondary | ICD-10-CM

## 2018-06-26 DIAGNOSIS — M5442 Lumbago with sciatica, left side: Secondary | ICD-10-CM

## 2018-06-26 DIAGNOSIS — M25552 Pain in left hip: Secondary | ICD-10-CM

## 2018-06-26 DIAGNOSIS — M25551 Pain in right hip: Secondary | ICD-10-CM

## 2018-06-26 DIAGNOSIS — R768 Other specified abnormal immunological findings in serum: Secondary | ICD-10-CM

## 2018-06-26 DIAGNOSIS — T391X1D Poisoning by 4-Aminophenol derivatives, accidental (unintentional), subsequent encounter: Secondary | ICD-10-CM

## 2018-06-26 DIAGNOSIS — I10 Essential (primary) hypertension: Secondary | ICD-10-CM

## 2018-06-26 DIAGNOSIS — G8929 Other chronic pain: Secondary | ICD-10-CM

## 2018-06-26 DIAGNOSIS — G40009 Localization-related (focal) (partial) idiopathic epilepsy and epileptic syndromes with seizures of localized onset, not intractable, without status epilepticus: Secondary | ICD-10-CM

## 2018-06-26 DIAGNOSIS — M5126 Other intervertebral disc displacement, lumbar region: Secondary | ICD-10-CM

## 2018-06-26 DIAGNOSIS — M25561 Pain in right knee: Secondary | ICD-10-CM

## 2018-06-26 NOTE — Progress Notes (Signed)
Subjective:    Patient ID: Ketzia Friede, female    DOB: [birth year], 59 y.o.   MRN: [id number]  06/26/2018     HPI This 57 y.o. female presents for evaluation of low back pain, myalgias, arthralgias.  Sister with lupus.  Hurting excessively; having constipation.  Losing weight.  Cannot do anything. Back is really hurting.   One of the residents at apartment, has aide that stays with er.   Wants test runned.  Mother with dementia.  Baby sister wjith SLE.   L lower back pain that radiates into medial knee and into L groin.  Tingling in leg.    Now taking 2 aleve and 1 tylenol three times per day.   Admits to taking four tylenol every 2-3 hours.   Before the hospital, was taking four tylenol for months.   Taking gabapentin four times per day.    Has been dizzy; not checking bp at home.  Not eating right.  Cannot fix food for self.   Now eating sandwiches.   No appointment with back specialist; scheduled for pain management Wednesday.  Abdominal pain: still waiting for appointment with GI; s/p CT abdomen/pelvis in June 2019.    [facility]; has too many insurances.    Sees psychiatrist in August.     BP Readings from Last 3 Encounters:  06/26/18 102/70  06/13/18 118/72  05/26/18 (!) 184/95   Wt Readings from Last 3 Encounters:  06/26/18 105 lb (47.6 kg)  06/13/18 106 lb 9.6 oz (48.4 kg)  05/26/18 108 lb (49 kg)   Immunization History  Administered Date(s) Administered  . Hepatitis A, Adult 05/11/2016, 04/06/2017  . Influenza,inj,Quad PF,6+ Mos 09/29/2015, 09/04/2016, 09/02/2017  . Pneumococcal Polysaccharide-23 11/12/2011  . Tdap 05/11/2016    Review of Systems  Constitutional: Negative for activity change, appetite change, chills, diaphoresis, fatigue, fever and unexpected weight change.  HENT: Negative for congestion, dental problem, drooling, ear discharge, ear pain, facial swelling, hearing loss, mouth sores, nosebleeds, postnasal drip, rhinorrhea,  sinus pressure, sneezing, sore throat, tinnitus, trouble swallowing and voice change.   Eyes: Negative for photophobia, pain, discharge, redness, itching and visual disturbance.  Respiratory: Negative for apnea, cough, choking, chest tightness, shortness of breath, wheezing and stridor.   Cardiovascular: Negative for chest pain, palpitations and leg swelling.  Gastrointestinal: Negative for abdominal distention, abdominal pain, anal bleeding, blood in stool, constipation, diarrhea, nausea, rectal pain and vomiting.  Endocrine: Negative for cold intolerance, heat intolerance, polydipsia, polyphagia and polyuria.  Genitourinary: Positive for pelvic pain. Negative for decreased urine volume, difficulty urinating, dyspareunia, dysuria, enuresis, flank pain, frequency, genital sores, hematuria, menstrual problem, urgency, vaginal bleeding, vaginal discharge and vaginal pain.  Musculoskeletal: Positive for arthralgias, back pain and myalgias. Negative for gait problem, joint swelling, neck pain and neck stiffness.  Skin: Negative for color change, pallor, rash and wound.  Allergic/Immunologic: Negative for environmental allergies, food allergies and immunocompromised state.  Neurological: Negative for dizziness, tremors, seizures, syncope, facial asymmetry, speech difficulty, weakness, light-headedness, numbness and headaches.  Hematological: Negative for adenopathy. Does not bruise/bleed easily.  Psychiatric/Behavioral: Negative for agitation, behavioral problems, confusion, decreased concentration, dysphoric mood, hallucinations, self-injury, sleep disturbance and suicidal ideas. The patient is not nervous/anxious and is not hyperactive.     Past Medical History:  Diagnosis Date  . Allergy   . Alopecia   . Anxiety   . Arthritis   . Asthma   . Cataract    "mild"  . COPD (chronic obstructive pulmonary disease) (HCC)   .  Depression   . GERD (gastroesophageal reflux disease)    o cc- takes OTC if  needed.  . Glaucoma   . Hepatitis C   . Hypertension    onset age 20.  . Iron deficiency anemia   . Migraine   . Schizoaffective disorder (HCC)   . Sciatic pain   . Seizures (HCC)    onset in childhood. 06-03-16- per pt 8 months agoGeneralized tonic-clonic.Last seizure couple of months ago- last sz 9-10 months ago per pt 10-12-2016  . Shortness of breath dyspnea   . Substance abuse (HCC)   . Ulcer    Past Surgical History:  Procedure Laterality Date  . ABDOMINAL HYSTERECTOMY     ovarian cyst B, cervical dysplasia, fibroids.   Ovaries intact.  . COLONOSCOPY    . COLONOSCOPY W/ BIOPSIES    . DILATION AND CURETTAGE OF UTERUS     x2  . EXPLORATORY LAPAROTOMY     x3  . GYNECOLOGIC CRYOSURGERY     x3  . MANDIBLE RECONSTRUCTION N/A 06/27/2015   Procedure: REMOVAL MAXILLARY PALATAL TORUS.  REMOVAL MANDIBULAR TORUS AND EXOSTOSIS.  ;  Surgeon: Ocie Doyne, DDS;  Location: [facility] OR;  Service: Oral Surgery;  Laterality: N/A;  . OVARIAN CYST REMOVAL  2008  . POLYPECTOMY     Allergies  Allergen Reactions  . Fruit & Vegetable Daily [Nutritional Supplements] Shortness Of Breath    Aloe  . Iodinated Diagnostic Agents Swelling    Swelling and itching of left side of her face only after CT SI injection(no steroid used)   Current Outpatient Medications on File Prior to Visit  Medication Sig Dispense Refill  . albuterol (VENTOLIN HFA) 108 (90 Base) MCG/ACT inhaler INHALE TWO PUFFS BY MOUTH EVERY 6 HOURS AS NEEDED FOR SHORTNESS OF BREATH 18 g 1  . budesonide-formoterol (SYMBICORT) 80-4.5 MCG/ACT inhaler Inhale 2 puffs into the lungs 2 (two) times daily. 3 Inhaler 1  . citalopram (CELEXA) 10 MG tablet Take 1 tablet (10 mg total) by mouth daily. For depression 90 tablet 0  . diclofenac sodium (VOLTAREN) 1 % GEL Apply 2 g topically 4 (four) times daily. 1 Tube 0  . ferrous sulfate 325 (65 FE) MG tablet Take 1 tablet (325 mg total) by mouth daily with breakfast. 90 tablet 1  . gabapentin (NEURONTIN)  400 MG capsule Take 1 capsule (400 mg total) by mouth 3 (three) times daily. 270 capsule 1  . ipratropium-albuterol (DUONEB) 0.5-2.5 (3) MG/3ML SOLN Take 3 mLs by nebulization every 4 (four) hours as needed (shortness of breath). Reported on 12/02/2015    . LamoTRIgine (LAMICTAL XR) 25 MG TB24 24 hour tablet take 1 tablet daily for 2 weeks, then increase to 2 tablets daily for 2 weeks 45 tablet 0  . methocarbamol (ROBAXIN) 500 MG tablet Take 2 tablets (1,000 mg total) by mouth every 8 (eight) hours as needed for muscle spasms. 60 tablet 0  . QUEtiapine (SEROQUEL) 300 MG tablet Take 1 tablet (300 mg total) by mouth at bedtime. Mood control 90 tablet 0  . triamcinolone cream (KENALOG) 0.1 % APPLY  CREAM EXTERNALLY TWICE DAILY 30 g 0  . valACYclovir (VALTREX) 1000 MG tablet Take 2 tabs po at symptom onset, repeat in 12 hours (Patient taking differently: Take 2,000 mg by mouth daily as needed (flare ups). ) 20 tablet 3   No current facility-administered medications on file prior to visit.    Social History   Socioeconomic History  . Marital status: Widowed  Spouse name: Not on file  . Number of children: 1  . Years of education: Not on file  . Highest education level: Not on file  Occupational History  . Occupation: disabled    Comment: mental illness; seizures  Social Needs  . Financial resource strain: Not on file  . Food insecurity:    Worry: Not on file    Inability: Not on file  . Transportation needs:    Medical: Not on file    Non-medical: Not on file  Tobacco Use  . Smoking status: Current Every Day Smoker    Packs/day: 0.33    Years: 44.00    Pack years: 14.52    Types: Cigarettes  . Smokeless tobacco: Never Used  Substance and Sexual Activity  . Alcohol use: Yes    Alcohol/week: 0.0 oz    Comment: occasionally  . Drug use: Yes    Types: Marijuana  . Sexual activity: Not Currently    Birth control/protection: None    Comment: widow  Lifestyle  . Physical activity:     Days per week: Not on file    Minutes per session: Not on file  . Stress: Not on file  Relationships  . Social connections:    Talks on phone: Not on file    Gets together: Not on file    Attends religious service: Not on file    Active member of club or organization: Not on file    Attends meetings of clubs or organizations: Not on file    Relationship status: Not on file  . Intimate partner violence:    Fear of current or ex partner: Not on file    Emotionally abused: Not on file    Physically abused: Not on file    Forced sexual activity: Not on file  Other Topics Concern  . Not on file  Social History Narrative   Marital status:  Widowed since 2002.  Married x 16 years. + dating.  Moved from [place] to live with daughter in 2013.     Children:  One child/daughter (33); two grandchildren.      Lives: with daughter, grandchildren 2.  Does not drive due to epilepsy.      Employment:  Disability for schizoaffective disorder.      Tobacco: 1 ppd x since 8th grade.      Alcohol:  Social; rare drinking due to seizure medications.  Weekends.       Drugs: none; previous use of marijuana.  Previous iv drug use, cocaine.      Exercise: none      Seatbelt:  100%      Guns: none   Family History  Problem Relation Age of Onset  . Diabetes type II Mother   . Hypertension Mother   . Arthritis Mother   . Diabetes Mother   . Mental illness Mother        bipolar  . Diabetes type II Maternal Aunt   . Hypertension Father   . Heart murmur Father   . Arthritis Father   . Stroke Father   . Alopecia Sister   . Alopecia Brother   . Alopecia Sister   . Mental retardation Sister        depression  . Prostate cancer Paternal Grandfather   . Cancer Maternal Uncle   . Colon cancer Neg Hx   . Esophageal cancer Neg Hx   . Rectal cancer Neg Hx   . Stomach cancer Neg Hx   .  Colon polyps Neg Hx        Objective:    BP 102/70   Pulse (!) 108   Temp 98.7 F (37.1 C) (Oral)    Resp 16   Ht 5' 4.57" (1.64 m)   Wt 105 lb (47.6 kg)   SpO2 97%   BMI 17.71 kg/m  Physical Exam  Constitutional: She is oriented to person, place, and time. She appears well-developed and well-nourished. No distress.  HENT:  Head: Normocephalic and atraumatic.  Right Ear: External ear normal.  Left Ear: External ear normal.  Nose: Nose normal.  Mouth/Throat: Oropharynx is clear and moist.  Eyes: Pupils are equal, round, and reactive to light. Conjunctivae and EOM are normal.  Neck: Normal range of motion and full passive range of motion without pain. Neck supple. No JVD present. Carotid bruit is not present. No thyromegaly present.  Cardiovascular: Normal rate, regular rhythm and normal heart sounds. Exam reveals no gallop and no friction rub.  No murmur heard. Pulmonary/Chest: Effort normal and breath sounds normal. She has no wheezes. She has no rales.  Abdominal: Soft. Bowel sounds are normal. She exhibits no distension and no mass. There is no tenderness. There is no rebound and no guarding. No hernia.  Musculoskeletal:       Right shoulder: Normal.       Left shoulder: Normal.       Cervical back: Normal.       Lumbar back: She exhibits decreased range of motion, pain and spasm. She exhibits normal pulse.  Lymphadenopathy:    She has no cervical adenopathy.  Neurological: She is alert and oriented to person, place, and time. She has normal reflexes. No cranial nerve deficit or sensory deficit. She exhibits normal muscle tone. Coordination normal.  Skin: Skin is warm and dry. No rash noted. She is not diaphoretic. No erythema. No pallor.  Psychiatric: She has a normal mood and affect. Her behavior is normal. Judgment and thought content normal.  Nursing note and vitals reviewed.  No results found. Depression screen [facility] 2/9 06/26/2018 06/13/2018 06/13/2018 05/26/2018 09/02/2017  Decreased Interest 1 0 0 0 1  Down, Depressed, Hopeless 1 0 0 0 1  PHQ - 2 Score 2 0 0 0 2  Altered  sleeping 1 - - - 0  Tired, decreased energy 1 - - - 1  Change in appetite 1 - - - 0  Feeling bad or failure about yourself  1 - - - -  Trouble concentrating 1 - - - 0  Moving slowly or fidgety/restless 1 - - - 0  Suicidal thoughts 0 - - - 0  PHQ-9 Score 8 - - - 3  Difficult doing work/chores - - - - -  Some recent data might be hidden   Fall Risk  06/26/2018 06/13/2018 06/13/2018 05/26/2018 09/02/2017  Falls in the past year? No No No Yes Yes  Number falls in past yr: - - - 2 or more 2 or more  Comment - - - - -  Injury with Fall? - - - Yes -  Follow up - - - - -        Assessment & Plan:   1. Chronic arthralgias of knees and hips   2. Myalgia   3. Chronic hepatitis C without hepatic coma (HCC)   4. Chronic right-sided low back pain with left-sided sciatica   5. Essential hypertension   6. Localization-related idiopathic epilepsy and epileptic syndromes with seizures of localized onset, not  intractable, without status epilepticus (HCC)   7. Lumbar disc herniation   8. Accidental acetaminophen overdose, subsequent encounter   9. Schizoaffective disorder, bipolar type (HCC)     Arthralgias: New onset.  Patient sister with lupus.  Obtain autoimmune labs to rule out secondary causes to arthralgias and chronic lower back pain.  History of Tylenol overdose with chronic lower back pain and lumbar disc herniation: Patient with recent admission for Tylenol overdose.  Patient presented 2 weeks ago for hospital follow-up and liver function studies were extremely elevated.  Patient states she now is only taking one Tylenol every 4 hours with Aleve.  I advised patient she must to limit her Aleve intake as well as Tylenol intake.  I recommend her taking 1 gabapentin with one Aleve and one Tylenol.  Patient has an  appointment with pain management tomorrow for chronic lower back pain.  Patient with known history of cocaine abuse thus opiate management not a consideration.  Hypertension: With  borderline low blood pressure readings today.  Decrease amlodipine to 5 mg daily.  Schizoaffective disorder with seizure disorder: Place referral for [facility] referral at last visit it apparently does not qualify.  Will contact [facility] and support staff for clarification as patient has [employer].  Patient with poor social support and unable to drive thus has poor compliance with medical follow-up with specialist.   Will place home health referral to evaluate needs and home safety.   Orders Placed This Encounter  Procedures  . CBC with Differential/Platelet  . Comprehensive metabolic panel  . CK  . Sedimentation rate  . Uric acid  . Rheumatoid factor  . ANA,IFA RA Diag Pnl w/rflx Tit/Patn  . Ambulatory referral to Home Health    Referral Priority:   Routine    Referral Type:   Home Health Care    Referral Reason:   Specialty Services Required    Requested Specialty:   Home Health Services    Number of Visits Requested:   1   Meds ordered this encounter  Medications  . amLODipine (NORVASC) 5 MG tablet    Sig: Take 1 tablet (5 mg total) by mouth daily.    Dispense:  90 tablet    Refill:  0    Return in about 1 month (around 07/24/2018) for complete physical examiniation ZOE STALLINGS.   Zaya Kessenich Paulita Fujita, M.D. Primary Care at [facility] previously [facility] [address] [place], [place]  [address] [phone] phone [phone] fax

## 2018-06-26 NOTE — Patient Instructions (Addendum)
   Switch to amlodipine/norvasc 5mg  daily.  IF you received an x-ray today, you will receive an invoice from [facility]. Please contact [facility] at [phone] with questions or concerns regarding your invoice.   IF you received labwork today, you will receive an invoice from [place]. Please contact [facility] at [phone] with questions or concerns regarding your invoice.   Our billing staff will not be able to assist you with questions regarding bills from these companies.  You will be contacted with the lab results as soon as they are available. The fastest way to get your results is to activate your My Chart account. Instructions are located on the last page of this paperwork. If you have not heard from [place] regarding the results in 2 weeks, please contact this office.

## 2018-06-27 MED ORDER — AMLODIPINE BESYLATE 5 MG PO TABS: 5.0000 mg | ORAL_TABLET | Freq: Every day | ORAL | 0 refills | Status: DC

## 2018-06-28 LAB — CBC WITH DIFFERENTIAL/PLATELET
Basophils Absolute: 0.1 x10E3/uL (ref 0.0–0.2)
Basos: 1 %
EOS (ABSOLUTE): 0.1 x10E3/uL (ref 0.0–0.4)
Eos: 1 %
Hematocrit: 32 % — ABNORMAL LOW (ref 34.0–46.6)
Hemoglobin: 9.7 g/dL — ABNORMAL LOW (ref 11.1–15.9)
Immature Grans (Abs): 0 x10E3/uL (ref 0.0–0.1)
Immature Granulocytes: 0 %
Lymphocytes Absolute: 1.6 x10E3/uL (ref 0.7–3.1)
Lymphs: 25 %
MCH: 28.3 pg (ref 26.6–33.0)
MCHC: 30.3 g/dL — ABNORMAL LOW (ref 31.5–35.7)
MCV: 93 fL (ref 79–97)
Monocytes Absolute: 0.4 x10E3/uL (ref 0.1–0.9)
Monocytes: 6 %
Neutrophils Absolute: 4.3 x10E3/uL (ref 1.4–7.0)
Neutrophils: 67 %
Platelets: 437 x10E3/uL (ref 150–450)
RBC: 3.43 x10E6/uL — ABNORMAL LOW (ref 3.77–5.28)
RDW: 21.1 % — ABNORMAL HIGH (ref 12.3–15.4)
WBC: 6.4 x10E3/uL (ref 3.4–10.8)

## 2018-06-28 LAB — COMPREHENSIVE METABOLIC PANEL WITH GFR
ALT: 59 IU/L — ABNORMAL HIGH (ref 0–32)
AST: 82 IU/L — ABNORMAL HIGH (ref 0–40)
Albumin/Globulin Ratio: 1.3 (ref 1.2–2.2)
Albumin: 4.6 g/dL (ref 3.5–5.5)
Alkaline Phosphatase: 127 IU/L — ABNORMAL HIGH (ref 39–117)
BUN/Creatinine Ratio: 12 (ref 9–23)
BUN: 11 mg/dL (ref 6–24)
Bilirubin Total: 0.3 mg/dL (ref 0.0–1.2)
CO2: 24 mmol/L (ref 20–29)
Calcium: 10.7 mg/dL — ABNORMAL HIGH (ref 8.7–10.2)
Chloride: 97 mmol/L (ref 96–106)
Creatinine, Ser: 0.95 mg/dL (ref 0.57–1.00)
GFR calc Af Amer: 76 mL/min/1.73 (ref >59)
GFR calc non Af Amer: 66 mL/min/1.73 (ref >59)
Globulin, Total: 3.6 g/dL (ref 1.5–4.5)
Glucose: 95 mg/dL (ref 65–99)
Potassium: 4.7 mmol/L (ref 3.5–5.2)
Sodium: 136 mmol/L (ref 134–144)
Total Protein: 8.2 g/dL (ref 6.0–8.5)

## 2018-06-28 LAB — SEDIMENTATION RATE: Sed Rate: 27 mm/hr (ref 0–40)

## 2018-06-28 LAB — ANA,IFA RA DIAG PNL W/RFLX TIT/PATN
ANA Titer 1: NEGATIVE
Cyclic Citrullin Peptide Ab: 6 units (ref 0–19)
Rhuematoid fact SerPl-aCnc: 37.6 IU/mL — ABNORMAL HIGH (ref 0.0–13.9)

## 2018-06-28 LAB — CK: Total CK: 220 U/L — ABNORMAL HIGH (ref 24–173)

## 2018-06-28 LAB — URIC ACID: Uric Acid: 5.3 mg/dL (ref 2.5–7.1)

## 2018-07-05 ENCOUNTER — Ambulatory Visit (INDEPENDENT_AMBULATORY_CARE_PROVIDER_SITE_OTHER): Payer: 59 | Admitting: Family Medicine

## 2018-07-05 ENCOUNTER — Other Ambulatory Visit: Payer: Self-pay

## 2018-07-05 ENCOUNTER — Encounter: Payer: Self-pay | Admitting: Family Medicine

## 2018-07-05 VITALS — BP 160/78 | HR 105 | Temp 98.0°F | Resp 16 | Wt 105.0 lb

## 2018-07-05 DIAGNOSIS — T391X1D Poisoning by 4-Aminophenol derivatives, accidental (unintentional), subsequent encounter: Secondary | ICD-10-CM

## 2018-07-05 DIAGNOSIS — I10 Essential (primary) hypertension: Secondary | ICD-10-CM

## 2018-07-05 DIAGNOSIS — R3915 Urgency of urination: Secondary | ICD-10-CM

## 2018-07-05 DIAGNOSIS — G40909 Epilepsy, unspecified, not intractable, without status epilepticus: Secondary | ICD-10-CM

## 2018-07-05 DIAGNOSIS — Z7251 High risk heterosexual behavior: Secondary | ICD-10-CM

## 2018-07-05 DIAGNOSIS — G8929 Other chronic pain: Secondary | ICD-10-CM

## 2018-07-05 DIAGNOSIS — M5136 Other intervertebral disc degeneration, lumbar region: Secondary | ICD-10-CM | POA: Diagnosis not present

## 2018-07-05 DIAGNOSIS — F250 Schizoaffective disorder, bipolar type: Secondary | ICD-10-CM

## 2018-07-05 DIAGNOSIS — M25552 Pain in left hip: Secondary | ICD-10-CM

## 2018-07-05 DIAGNOSIS — M51369 Other intervertebral disc degeneration, lumbar region without mention of lumbar back pain or lower extremity pain: Secondary | ICD-10-CM

## 2018-07-05 DIAGNOSIS — M25511 Pain in right shoulder: Secondary | ICD-10-CM

## 2018-07-05 DIAGNOSIS — M5126 Other intervertebral disc displacement, lumbar region: Secondary | ICD-10-CM

## 2018-07-05 MED ORDER — FERROUS SULFATE 325 (65 FE) MG PO TABS: 325.0000 mg | ORAL_TABLET | Freq: Two times a day (BID) | ORAL | 1 refills | Status: DC

## 2018-07-05 MED ORDER — GETGO ROLLING WALKER MISC: 0 refills | Status: DC

## 2018-07-05 NOTE — Progress Notes (Signed)
Subjective:    Patient ID: Melissa Bridges, female    DOB: 1961-05-28, 57 y.o.   MRN: [id number]  07/05/2018  Sciatic Pain (pt states she has pain on the left side of body and have been having this pain for months )    HPI This 57 y.o. female presents for acute visit for chronic lower back pain.   [facility]  [facility] has been sitting with patient; state RN came in today for assessment.  Assessment came in on 06/28/18.  [facility] came in today.  Has been in a lot of pain.  Has been falling. Outside laying in the grass.  Pain acutely worsened when stopped Tylenol.  Taking 4 Tylenol every 3-4 hours.   Getting five hours per week.  One hour per day. Taking one iron tablet daily.  Urinating frequently; urgency. Taking gabapentin twice daily. PHONE CALL: [phone] MS. Sharee Pimple; [phone] FAX. Sees GI in September. Requesting syphilis testing; high risk sexual partner/female five years ago.  Recent HIV testing during admission for tylenol overdose.  BP Readings from Last 3 Encounters:  07/05/18 (!) 160/78  06/26/18 102/70  06/13/18 118/72   Wt Readings from Last 3 Encounters:  07/05/18 105 lb (47.6 kg)  06/26/18 105 lb (47.6 kg)  06/13/18 106 lb 9.6 oz (48.4 kg)   Immunization History  Administered Date(s) Administered  . Hepatitis A, Adult 05/11/2016, 04/06/2017  . Influenza,inj,Quad PF,6+ Mos 09/29/2015, 09/04/2016, 09/02/2017  . Pneumococcal Polysaccharide-23 11/12/2011  . Tdap 05/11/2016    Review of Systems  Constitutional: Negative for chills, diaphoresis, fatigue and fever.  Eyes: Negative for visual disturbance.  Respiratory: Negative for cough and shortness of breath.   Cardiovascular: Negative for chest pain, palpitations and leg swelling.  Gastrointestinal: Negative for abdominal pain, constipation, diarrhea, nausea and vomiting.  Endocrine: Negative for cold intolerance, heat intolerance, polydipsia, polyphagia and polyuria.  Musculoskeletal: Positive for  arthralgias, back pain and gait problem.  Neurological: Negative for dizziness, tremors, seizures, syncope, facial asymmetry, speech difficulty, weakness, light-headedness, numbness and headaches.    Past Medical History:  Diagnosis Date  . Allergy   . Alopecia   . Anxiety   . Arthritis   . Asthma   . Cataract    "mild"  . COPD (chronic obstructive pulmonary disease) (HCC)   . Depression   . GERD (gastroesophageal reflux disease)    o cc- takes OTC if needed.  . Glaucoma   . Hepatitis C   . Hypertension    onset age 26.  . Iron deficiency anemia   . Migraine   . Schizoaffective disorder (HCC)   . Sciatic pain   . Seizures (HCC)    onset in childhood. 06-03-16- per pt 8 months agoGeneralized tonic-clonic.Last seizure couple of months ago- last sz 9-10 months ago per pt 10-12-2016  . Shortness of breath dyspnea   . Substance abuse (HCC)   . Ulcer    Past Surgical History:  Procedure Laterality Date  . ABDOMINAL HYSTERECTOMY     ovarian cyst B, cervical dysplasia, fibroids.   Ovaries intact.  . COLONOSCOPY    . COLONOSCOPY W/ BIOPSIES    . DILATION AND CURETTAGE OF UTERUS     x2  . EXPLORATORY LAPAROTOMY     x3  . GYNECOLOGIC CRYOSURGERY     x3  . MANDIBLE RECONSTRUCTION N/A 06/27/2015   Procedure: REMOVAL MAXILLARY PALATAL TORUS.  REMOVAL MANDIBULAR TORUS AND EXOSTOSIS.  ;  Surgeon: Ocie Doyne, DDS;  Location: [facility] OR;  Service: Oral Surgery;  Laterality: N/A;  . OVARIAN CYST REMOVAL  2008  . POLYPECTOMY     Allergies  Allergen Reactions  . Fruit & Vegetable Daily [Nutritional Supplements] Shortness Of Breath    Aloe  . Iodinated Diagnostic Agents Swelling    Swelling and itching of left side of her face only after CT SI injection(no steroid used)   Current Outpatient Medications on File Prior to Visit  Medication Sig Dispense Refill  . albuterol (VENTOLIN HFA) 108 (90 Base) MCG/ACT inhaler INHALE TWO PUFFS BY MOUTH EVERY 6 HOURS AS NEEDED FOR SHORTNESS OF BREATH  18 g 1  . amLODipine (NORVASC) 5 MG tablet Take 1 tablet (5 mg total) by mouth daily. 90 tablet 0  . budesonide-formoterol (SYMBICORT) 80-4.5 MCG/ACT inhaler Inhale 2 puffs into the lungs 2 (two) times daily. 3 Inhaler 1  . citalopram (CELEXA) 10 MG tablet Take 1 tablet (10 mg total) by mouth daily. For depression 90 tablet 0  . diclofenac sodium (VOLTAREN) 1 % GEL Apply 2 g topically 4 (four) times daily. 1 Tube 0  . DULoxetine (CYMBALTA) 30 MG capsule Take by mouth.    . gabapentin (NEURONTIN) 400 MG capsule Take 1 capsule (400 mg total) by mouth 3 (three) times daily. 270 capsule 1  . ipratropium-albuterol (DUONEB) 0.5-2.5 (3) MG/3ML SOLN Take 3 mLs by nebulization every 4 (four) hours as needed (shortness of breath). Reported on 12/02/2015    . LamoTRIgine (LAMICTAL XR) 25 MG TB24 24 hour tablet take 1 tablet daily for 2 weeks, then increase to 2 tablets daily for 2 weeks 45 tablet 0  . methocarbamol (ROBAXIN) 500 MG tablet Take 2 tablets (1,000 mg total) by mouth every 8 (eight) hours as needed for muscle spasms. 60 tablet 0  . QUEtiapine (SEROQUEL) 300 MG tablet Take 1 tablet (300 mg total) by mouth at bedtime. Mood control 90 tablet 0  . triamcinolone cream (KENALOG) 0.1 % APPLY  CREAM EXTERNALLY TWICE DAILY 30 g 0  . valACYclovir (VALTREX) 1000 MG tablet Take 2 tabs po at symptom onset, repeat in 12 hours (Patient taking differently: Take 2,000 mg by mouth daily as needed (flare ups). ) 20 tablet 3   No current facility-administered medications on file prior to visit.    Social History   Socioeconomic History  . Marital status: Widowed    Spouse name: Not on file  . Number of children: 1  . Years of education: Not on file  . Highest education level: Not on file  Occupational History  . Occupation: disabled    Comment: mental illness; seizures  Social Needs  . Financial resource strain: Not on file  . Food insecurity:    Worry: Not on file    Inability: Not on file  .  Transportation needs:    Medical: Not on file    Non-medical: Not on file  Tobacco Use  . Smoking status: Current Every Day Smoker    Packs/day: 0.33    Years: 44.00    Pack years: 14.52    Types: Cigarettes  . Smokeless tobacco: Never Used  Substance and Sexual Activity  . Alcohol use: Yes    Alcohol/week: 0.0 oz    Comment: occasionally  . Drug use: Yes    Types: Marijuana  . Sexual activity: Not Currently    Birth control/protection: None    Comment: widow  Lifestyle  . Physical activity:    Days per week: Not on file    Minutes per  session: Not on file  . Stress: Not on file  Relationships  . Social connections:    Talks on phone: Not on file    Gets together: Not on file    Attends religious service: Not on file    Active member of club or organization: Not on file    Attends meetings of clubs or organizations: Not on file    Relationship status: Not on file  . Intimate partner violence:    Fear of current or ex partner: Not on file    Emotionally abused: Not on file    Physically abused: Not on file    Forced sexual activity: Not on file  Other Topics Concern  . Not on file  Social History Narrative   Marital status:  Widowed since 2002.  Married x 16 years. + dating.  Moved from [place] to live with daughter in 2013.     Children:  One child/daughter (33); two grandchildren.      Lives: with daughter, grandchildren 2.  Does not drive due to epilepsy.      Employment:  Disability for schizoaffective disorder.      Tobacco: 1 ppd x since 8th grade.      Alcohol:  Social; rare drinking due to seizure medications.  Weekends.       Drugs: none; previous use of marijuana.  Previous iv drug use, cocaine.      Exercise: none      Seatbelt:  100%      Guns: none   Family History  Problem Relation Age of Onset  . Diabetes type II Mother   . Hypertension Mother   . Arthritis Mother   . Diabetes Mother   . Mental illness Mother        bipolar  . Diabetes  type II Maternal Aunt   . Hypertension Father   . Heart murmur Father   . Arthritis Father   . Stroke Father   . Alopecia Sister   . Alopecia Brother   . Alopecia Sister   . Mental retardation Sister        depression  . Prostate cancer Paternal Grandfather   . Cancer Maternal Uncle   . Colon cancer Neg Hx   . Esophageal cancer Neg Hx   . Rectal cancer Neg Hx   . Stomach cancer Neg Hx   . Colon polyps Neg Hx        Objective:    BP (!) 160/78   Pulse (!) 105   Temp 98 F (36.7 C) (Oral)   Resp 16   Wt 105 lb (47.6 kg)   SpO2 98%   BMI 17.71 kg/m  Physical Exam  Constitutional: She is oriented to person, place, and time. She appears well-developed and well-nourished. No distress.  HENT:  Head: Normocephalic and atraumatic.  Right Ear: External ear normal.  Left Ear: External ear normal.  Nose: Nose normal.  Mouth/Throat: Oropharynx is clear and moist.  Eyes: Pupils are equal, round, and reactive to light. Conjunctivae and EOM are normal.  Neck: Normal range of motion. Neck supple. Carotid bruit is not present. No thyromegaly present.  Cardiovascular: Normal rate, regular rhythm, normal heart sounds and intact distal pulses. Exam reveals no gallop and no friction rub.  No murmur heard. Pulmonary/Chest: Effort normal and breath sounds normal. She has no wheezes. She has no rales.  Abdominal: Soft. Bowel sounds are normal. She exhibits no distension and no mass. There is no tenderness. There is no  rebound and no guarding.  Musculoskeletal:       Left shoulder: She exhibits decreased range of motion, pain and decreased strength. She exhibits no tenderness, no bony tenderness, no spasm and normal pulse.       Cervical back: Normal. She exhibits normal range of motion, no tenderness and no bony tenderness.       Lumbar back: She exhibits decreased range of motion. She exhibits no tenderness and no bony tenderness.  Lymphadenopathy:    She has no cervical adenopathy.    Neurological: She is alert and oriented to person, place, and time. No cranial nerve deficit.  Skin: Skin is warm and dry. No rash noted. She is not diaphoretic. No erythema. No pallor.  Psychiatric: She has a normal mood and affect. Her behavior is normal.   No results found. Depression screen [facility] 2/9 07/05/2018 06/26/2018 06/13/2018 06/13/2018 05/26/2018  Decreased Interest 3 1 0 0 0  Down, Depressed, Hopeless 3 1 0 0 0  PHQ - 2 Score 6 2 0 0 0  Altered sleeping 3 1 - - -  Tired, decreased energy 3 1 - - -  Change in appetite 1 1 - - -  Feeling bad or failure about yourself  3 1 - - -  Trouble concentrating 0 1 - - -  Moving slowly or fidgety/restless 0 1 - - -  Suicidal thoughts 0 0 - - -  PHQ-9 Score 16 8 - - -  Difficult doing work/chores - - - - -  Some recent data might be hidden   Fall Risk  07/05/2018 06/26/2018 06/13/2018 06/13/2018 05/26/2018  Falls in the past year? Yes No No No Yes  Number falls in past yr: 2 or more - - - 2 or more  Comment - - - - -  Injury with Fall? - - - - Yes  Follow up - - - - -        Assessment & Plan:   1. Degenerative disc disease, lumbar   2. Urinary urgency   3. Essential hypertension   4. High risk heterosexual behavior   5. Unintentional Tylenol overdose, subsequent encounter   6. Lumbar disc herniation   7. Chronic left hip pain   8. Seizure disorder (HCC)   9. Schizoaffective disorder, bipolar type (HCC)   10. Pain in joint of right shoulder     Lower back pain: Acute worsening due to decrease in Tylenol dose.  Increase gabapentin to 3 times daily as prescribed.  Increase methocarbamol to 3 times daily as prescribed.  Refer to neurosurgery to see if candidate for laser treatment or other options.  Recommend 3 to 4 hours of seeing a support to help with medication administration, personal hygiene, management of compliance with medical follow-up.  Status post pain clinic consultation if patient is not an opiate candidate due to history of  cocaine use.  Shoulder issues due to previous injury per patient.  Recommend rest, icing.  Patient declined x-ray today.  If pain persists, recommend left shoulder x-ray.  May warrant referral to orthopedics.  Tylenol misuse: Patient continues to use Tylenol in excess of maximum daily allowance.  Patient with history of hepatitis C this needs to avoid excessive Tylenol use.  Seizure disorder: With recent noncompliance with medication prior to admission for Tylenol overdose.  Discussed with patient the importance of compliance with antiseizure medication.  Recommend home health assistance to confirm medication compliance.  Schizoaffective disorder: Emphasized importance with compliance with medication.  Highly recommend follow-up with psychiatry.  Left shoulder pain: New yet chronic  Iron deficiency anemia: With recent hemoglobin of 9.6.  Increase ferrous sulfate to twice daily.  Upcoming appointment with gastroenterology for abdominal pain in September 2019.  Orders Placed This Encounter  Procedures  . Urine Culture  . CBC with Differential/Platelet  . Comprehensive metabolic panel  . RPR  . Ambulatory referral to Neurosurgery    Referral Priority:   Routine    Referral Type:   Surgical    Referral Reason:   Specialty Services Required    Requested Specialty:   Neurosurgery    Number of Visits Requested:   1  . POCT urinalysis dipstick   Meds ordered this encounter  Medications  . Misc. Devices (GETGO ROLLING WALKER) MISC    Sig: Sig: one rolling walker any name brand with ambulation. DDD lumbar spine    Dispense:  1 each    Refill:  0  . ferrous sulfate 325 (65 FE) MG tablet    Sig: Take 1 tablet (325 mg total) by mouth 2 (two) times daily with a meal.    Dispense:  180 tablet    Refill:  1    No follow-ups on file.   Mellony Danziger Paulita Fujita, M.D. Primary Care at [facility] previously [facility] [address] [place], [place]  [address] [phone] phone [phone] fax

## 2018-07-05 NOTE — Patient Instructions (Addendum)
  GABAPENTIN 400MG  INCREASE TO THREE TIMES. INCREASE IRON TO TWO TIMES DAILY. CONTINUE METHOCARBAMOL ONE THREE TIMES PER DAY.   IF you received an x-ray today, you will receive an invoice from [facility]. Please contact [facility] at [phone] with questions or concerns regarding your invoice.   IF you received labwork today, you will receive an invoice from [facility]. Please contact [facility] at [phone] with questions or concerns regarding your invoice.   Our billing staff will not be able to assist you with questions regarding bills from these companies.  You will be contacted with the lab results as soon as they are available. The fastest way to get your results is to activate your My Chart account. Instructions are located on the last page of this paperwork. If you have not heard from [place] regarding the results in 2 weeks, please contact this office.

## 2018-07-06 LAB — CBC WITH DIFFERENTIAL/PLATELET
Basophils Absolute: 0 x10E3/uL (ref 0.0–0.2)
Basos: 0 %
EOS (ABSOLUTE): 0 x10E3/uL (ref 0.0–0.4)
Eos: 0 %
Hematocrit: 32 % — ABNORMAL LOW (ref 34.0–46.6)
Hemoglobin: 9.9 g/dL — ABNORMAL LOW (ref 11.1–15.9)
Immature Grans (Abs): 0 x10E3/uL (ref 0.0–0.1)
Immature Granulocytes: 0 %
Lymphocytes Absolute: 2.9 x10E3/uL (ref 0.7–3.1)
Lymphs: 33 %
MCH: 28.4 pg (ref 26.6–33.0)
MCHC: 30.9 g/dL — ABNORMAL LOW (ref 31.5–35.7)
MCV: 92 fL (ref 79–97)
Monocytes Absolute: 0.5 x10E3/uL (ref 0.1–0.9)
Monocytes: 5 %
Neutrophils Absolute: 5.4 x10E3/uL (ref 1.4–7.0)
Neutrophils: 62 %
Platelets: 311 x10E3/uL (ref 150–450)
RBC: 3.48 x10E6/uL — ABNORMAL LOW (ref 3.77–5.28)
RDW: 22.4 % — ABNORMAL HIGH (ref 12.3–15.4)
WBC: 8.9 x10E3/uL (ref 3.4–10.8)

## 2018-07-06 LAB — COMPREHENSIVE METABOLIC PANEL
ALT: 42 IU/L — ABNORMAL HIGH (ref 0–32)
AST: 76 IU/L — ABNORMAL HIGH (ref 0–40)
Albumin/Globulin Ratio: 1.2 (ref 1.2–2.2)
Albumin: 4.8 g/dL (ref 3.5–5.5)
Alkaline Phosphatase: 96 IU/L (ref 39–117)
BILIRUBIN TOTAL: 0.4 mg/dL (ref 0.0–1.2)
BUN/Creatinine Ratio: 10 (ref 9–23)
BUN: 11 mg/dL (ref 6–24)
CALCIUM: 10.5 mg/dL — AB (ref 8.7–10.2)
CHLORIDE: 98 mmol/L (ref 96–106)
CO2: 23 mmol/L (ref 20–29)
CREATININE: 1.09 mg/dL — AB (ref 0.57–1.00)
GFR, EST AFRICAN AMERICAN: 64 mL/min/{1.73_m2} (ref >59)
GFR, EST NON AFRICAN AMERICAN: 56 mL/min/{1.73_m2} — AB (ref >59)
GLUCOSE: 106 mg/dL — AB (ref 65–99)
Globulin, Total: 3.9 g/dL (ref 1.5–4.5)
Potassium: 3.9 mmol/L (ref 3.5–5.2)
Sodium: 138 mmol/L (ref 134–144)
TOTAL PROTEIN: 8.7 g/dL — AB (ref 6.0–8.5)

## 2018-07-06 LAB — URINE CULTURE

## 2018-07-06 LAB — RPR: RPR Ser Ql: NONREACTIVE

## 2018-07-06 NOTE — Addendum Note (Signed)
Addended by: Wardell Honour on: 07/06/2018 03:56 PM   Modules accepted: Orders

## 2018-07-09 ENCOUNTER — Other Ambulatory Visit: Payer: Self-pay

## 2018-07-09 ENCOUNTER — Inpatient Hospital Stay: Payer: Self-pay

## 2018-07-09 ENCOUNTER — Inpatient Hospital Stay (HOSPITAL_COMMUNITY): Payer: 59

## 2018-07-09 ENCOUNTER — Encounter (HOSPITAL_COMMUNITY): Payer: Self-pay

## 2018-07-09 ENCOUNTER — Inpatient Hospital Stay (HOSPITAL_COMMUNITY)
Admission: EM | Admit: 2018-07-09 | Discharge: 2018-07-15 | DRG: 918 | Disposition: A | Discharge status: Home Health | Payer: 59 | Attending: Family Medicine | Admitting: Family Medicine

## 2018-07-09 DIAGNOSIS — K31819 Angiodysplasia of stomach and duodenum without bleeding: Secondary | ICD-10-CM | POA: Diagnosis present

## 2018-07-09 DIAGNOSIS — T43595A Adverse effect of other antipsychotics and neuroleptics, initial encounter: Secondary | ICD-10-CM | POA: Diagnosis present

## 2018-07-09 DIAGNOSIS — E876 Hypokalemia: Secondary | ICD-10-CM | POA: Diagnosis present

## 2018-07-09 DIAGNOSIS — T1491XA Suicide attempt, initial encounter: Secondary | ICD-10-CM | POA: Diagnosis not present

## 2018-07-09 DIAGNOSIS — K3189 Other diseases of stomach and duodenum: Secondary | ICD-10-CM | POA: Diagnosis not present

## 2018-07-09 DIAGNOSIS — T391X2A Poisoning by 4-Aminophenol derivatives, intentional self-harm, initial encounter: Principal | ICD-10-CM | POA: Diagnosis present

## 2018-07-09 DIAGNOSIS — R1084 Generalized abdominal pain: Secondary | ICD-10-CM | POA: Diagnosis not present

## 2018-07-09 DIAGNOSIS — G4700 Insomnia, unspecified: Secondary | ICD-10-CM | POA: Diagnosis present

## 2018-07-09 DIAGNOSIS — G43909 Migraine, unspecified, not intractable, without status migrainosus: Secondary | ICD-10-CM | POA: Diagnosis present

## 2018-07-09 DIAGNOSIS — R45851 Suicidal ideations: Secondary | ICD-10-CM

## 2018-07-09 DIAGNOSIS — Z7951 Long term (current) use of inhaled steroids: Secondary | ICD-10-CM

## 2018-07-09 DIAGNOSIS — M5442 Lumbago with sciatica, left side: Secondary | ICD-10-CM | POA: Diagnosis not present

## 2018-07-09 DIAGNOSIS — Z79899 Other long term (current) drug therapy: Secondary | ICD-10-CM

## 2018-07-09 DIAGNOSIS — Z91048 Other nonmedicinal substance allergy status: Secondary | ICD-10-CM

## 2018-07-09 DIAGNOSIS — F17200 Nicotine dependence, unspecified, uncomplicated: Secondary | ICD-10-CM | POA: Diagnosis present

## 2018-07-09 DIAGNOSIS — K219 Gastro-esophageal reflux disease without esophagitis: Secondary | ICD-10-CM | POA: Diagnosis present

## 2018-07-09 DIAGNOSIS — D509 Iron deficiency anemia, unspecified: Secondary | ICD-10-CM | POA: Diagnosis present

## 2018-07-09 DIAGNOSIS — R1010 Upper abdominal pain, unspecified: Secondary | ICD-10-CM | POA: Diagnosis present

## 2018-07-09 DIAGNOSIS — T391X2D Poisoning by 4-Aminophenol derivatives, intentional self-harm, subsequent encounter: Secondary | ICD-10-CM | POA: Diagnosis not present

## 2018-07-09 DIAGNOSIS — H409 Unspecified glaucoma: Secondary | ICD-10-CM | POA: Diagnosis present

## 2018-07-09 DIAGNOSIS — F1910 Other psychoactive substance abuse, uncomplicated: Secondary | ICD-10-CM | POA: Diagnosis present

## 2018-07-09 DIAGNOSIS — M5126 Other intervertebral disc displacement, lumbar region: Secondary | ICD-10-CM | POA: Diagnosis present

## 2018-07-09 DIAGNOSIS — Z818 Family history of other mental and behavioral disorders: Secondary | ICD-10-CM | POA: Diagnosis not present

## 2018-07-09 DIAGNOSIS — J449 Chronic obstructive pulmonary disease, unspecified: Secondary | ICD-10-CM | POA: Diagnosis present

## 2018-07-09 DIAGNOSIS — M5430 Sciatica, unspecified side: Secondary | ICD-10-CM | POA: Diagnosis present

## 2018-07-09 DIAGNOSIS — Z91410 Personal history of adult physical and sexual abuse: Secondary | ICD-10-CM

## 2018-07-09 DIAGNOSIS — F1490 Cocaine use, unspecified, uncomplicated: Secondary | ICD-10-CM | POA: Diagnosis not present

## 2018-07-09 DIAGNOSIS — M25552 Pain in left hip: Secondary | ICD-10-CM | POA: Diagnosis present

## 2018-07-09 DIAGNOSIS — F419 Anxiety disorder, unspecified: Secondary | ICD-10-CM | POA: Diagnosis present

## 2018-07-09 DIAGNOSIS — J439 Emphysema, unspecified: Secondary | ICD-10-CM | POA: Diagnosis present

## 2018-07-09 DIAGNOSIS — K921 Melena: Secondary | ICD-10-CM | POA: Diagnosis present

## 2018-07-09 DIAGNOSIS — B182 Chronic viral hepatitis C: Secondary | ICD-10-CM | POA: Diagnosis present

## 2018-07-09 DIAGNOSIS — R300 Dysuria: Secondary | ICD-10-CM | POA: Diagnosis present

## 2018-07-09 DIAGNOSIS — M545 Low back pain, unspecified: Secondary | ICD-10-CM | POA: Diagnosis present

## 2018-07-09 DIAGNOSIS — R040 Epistaxis: Secondary | ICD-10-CM | POA: Diagnosis present

## 2018-07-09 DIAGNOSIS — T50902A Poisoning by unspecified drugs, medicaments and biological substances, intentional self-harm, initial encounter: Secondary | ICD-10-CM | POA: Diagnosis present

## 2018-07-09 DIAGNOSIS — Z8719 Personal history of other diseases of the digestive system: Secondary | ICD-10-CM

## 2018-07-09 DIAGNOSIS — I10 Essential (primary) hypertension: Secondary | ICD-10-CM | POA: Diagnosis present

## 2018-07-09 DIAGNOSIS — F17210 Nicotine dependence, cigarettes, uncomplicated: Secondary | ICD-10-CM | POA: Diagnosis present

## 2018-07-09 DIAGNOSIS — K449 Diaphragmatic hernia without obstruction or gangrene: Secondary | ICD-10-CM | POA: Diagnosis present

## 2018-07-09 DIAGNOSIS — F1290 Cannabis use, unspecified, uncomplicated: Secondary | ICD-10-CM | POA: Diagnosis not present

## 2018-07-09 DIAGNOSIS — R531 Weakness: Secondary | ICD-10-CM | POA: Diagnosis present

## 2018-07-09 DIAGNOSIS — B001 Herpesviral vesicular dermatitis: Secondary | ICD-10-CM

## 2018-07-09 DIAGNOSIS — Z86010 Personal history of colonic polyps: Secondary | ICD-10-CM

## 2018-07-09 DIAGNOSIS — Z62810 Personal history of physical and sexual abuse in childhood: Secondary | ICD-10-CM | POA: Diagnosis not present

## 2018-07-09 DIAGNOSIS — D649 Anemia, unspecified: Secondary | ICD-10-CM | POA: Diagnosis present

## 2018-07-09 DIAGNOSIS — R2681 Unsteadiness on feet: Secondary | ICD-10-CM | POA: Diagnosis present

## 2018-07-09 DIAGNOSIS — G40909 Epilepsy, unspecified, not intractable, without status epilepticus: Secondary | ICD-10-CM | POA: Diagnosis present

## 2018-07-09 DIAGNOSIS — F1410 Cocaine abuse, uncomplicated: Secondary | ICD-10-CM | POA: Diagnosis present

## 2018-07-09 DIAGNOSIS — Z62811 Personal history of psychological abuse in childhood: Secondary | ICD-10-CM | POA: Diagnosis not present

## 2018-07-09 DIAGNOSIS — Z810 Family history of intellectual disabilities: Secondary | ICD-10-CM

## 2018-07-09 DIAGNOSIS — F250 Schizoaffective disorder, bipolar type: Secondary | ICD-10-CM | POA: Diagnosis present

## 2018-07-09 DIAGNOSIS — Z91041 Radiographic dye allergy status: Secondary | ICD-10-CM

## 2018-07-09 DIAGNOSIS — J431 Panlobular emphysema: Secondary | ICD-10-CM

## 2018-07-09 DIAGNOSIS — Z888 Allergy status to other drugs, medicaments and biological substances status: Secondary | ICD-10-CM

## 2018-07-09 DIAGNOSIS — R109 Unspecified abdominal pain: Secondary | ICD-10-CM

## 2018-07-09 DIAGNOSIS — R1013 Epigastric pain: Secondary | ICD-10-CM

## 2018-07-09 DIAGNOSIS — G8929 Other chronic pain: Secondary | ICD-10-CM | POA: Diagnosis present

## 2018-07-09 DIAGNOSIS — T50901A Poisoning by unspecified drugs, medicaments and biological substances, accidental (unintentional), initial encounter: Secondary | ICD-10-CM

## 2018-07-09 DIAGNOSIS — Z915 Personal history of self-harm: Secondary | ICD-10-CM

## 2018-07-09 DIAGNOSIS — Z8711 Personal history of peptic ulcer disease: Secondary | ICD-10-CM

## 2018-07-09 DIAGNOSIS — T50902D Poisoning by unspecified drugs, medicaments and biological substances, intentional self-harm, subsequent encounter: Secondary | ICD-10-CM | POA: Diagnosis not present

## 2018-07-09 DIAGNOSIS — T391X1A Poisoning by 4-Aminophenol derivatives, accidental (unintentional), initial encounter: Secondary | ICD-10-CM | POA: Diagnosis present

## 2018-07-09 DIAGNOSIS — G4710 Hypersomnia, unspecified: Secondary | ICD-10-CM | POA: Diagnosis present

## 2018-07-09 DIAGNOSIS — M1990 Unspecified osteoarthritis, unspecified site: Secondary | ICD-10-CM | POA: Diagnosis present

## 2018-07-09 LAB — IRON AND TIBC
Iron: 12 ug/dL — ABNORMAL LOW (ref 28–170)
SATURATION RATIOS: 2 % — AB (ref 10.4–31.8)
TIBC: 593 ug/dL — AB (ref 250–450)
UIBC: 581 ug/dL

## 2018-07-09 LAB — COMPREHENSIVE METABOLIC PANEL
ALT: 39 U/L (ref 0–44)
ANION GAP: 10 (ref 5–15)
AST: 73 U/L — ABNORMAL HIGH (ref 15–41)
Albumin: 4.4 g/dL (ref 3.5–5.0)
Alkaline Phosphatase: 74 U/L (ref 38–126)
BUN: 10 mg/dL (ref 6–20)
CHLORIDE: 105 mmol/L (ref 98–111)
CO2: 25 mmol/L (ref 22–32)
Calcium: 9.9 mg/dL (ref 8.9–10.3)
Creatinine, Ser: 1.39 mg/dL — ABNORMAL HIGH (ref 0.44–1.00)
GFR calc Af Amer: 47 mL/min — ABNORMAL LOW (ref >60)
GFR calc non Af Amer: 41 mL/min — ABNORMAL LOW (ref >60)
Glucose, Bld: 105 mg/dL — ABNORMAL HIGH (ref 70–99)
Potassium: 3.3 mmol/L — ABNORMAL LOW (ref 3.5–5.1)
SODIUM: 140 mmol/L (ref 135–145)
Total Bilirubin: 0.7 mg/dL (ref 0.3–1.2)
Total Protein: 8.7 g/dL — ABNORMAL HIGH (ref 6.5–8.1)

## 2018-07-09 LAB — RAPID URINE DRUG SCREEN, HOSP PERFORMED
AMPHETAMINES: NOT DETECTED
Barbiturates: NOT DETECTED
Benzodiazepines: NOT DETECTED
COCAINE: POSITIVE — AB
Opiates: NOT DETECTED
TETRAHYDROCANNABINOL: NOT DETECTED

## 2018-07-09 LAB — CBC
HCT: 30.3 % — ABNORMAL LOW (ref 36.0–46.0)
Hemoglobin: 9.8 g/dL — ABNORMAL LOW (ref 12.0–15.0)
MCH: 28.8 pg (ref 26.0–34.0)
MCHC: 32.3 g/dL (ref 30.0–36.0)
MCV: 89.1 fL (ref 78.0–100.0)
PLATELETS: 245 10*3/uL (ref 150–400)
RBC: 3.4 MIL/uL — ABNORMAL LOW (ref 3.87–5.11)
RDW: 21.9 % — AB (ref 11.5–15.5)
WBC: 4.8 10*3/uL (ref 4.0–10.5)

## 2018-07-09 LAB — RETICULOCYTES
RBC.: 3.37 MIL/uL — ABNORMAL LOW (ref 3.87–5.11)
RETIC CT PCT: 1.1 % (ref 0.4–3.1)
Retic Count, Absolute: 37.1 10*3/uL (ref 19.0–186.0)

## 2018-07-09 LAB — SALICYLATE LEVEL: Salicylate Lvl: <7 mg/dL (ref 2.8–30.0)

## 2018-07-09 LAB — CBG MONITORING, ED: Glucose-Capillary: 112 mg/dL — ABNORMAL HIGH (ref 70–99)

## 2018-07-09 LAB — ACETAMINOPHEN LEVEL: ACETAMINOPHEN (TYLENOL), SERUM: 131 ug/mL — AB (ref 10–30)

## 2018-07-09 LAB — PREGNANCY, URINE: Preg Test, Ur: NEGATIVE

## 2018-07-09 LAB — MAGNESIUM: MAGNESIUM: 1.5 mg/dL — AB (ref 1.7–2.4)

## 2018-07-09 LAB — FOLATE: FOLATE: 16.2 ng/mL (ref >5.9)

## 2018-07-09 LAB — PHOSPHORUS: PHOSPHORUS: 2.9 mg/dL (ref 2.5–4.6)

## 2018-07-09 LAB — PROTIME-INR
INR: 0.97
PROTHROMBIN TIME: 12.8 s (ref 11.4–15.2)

## 2018-07-09 LAB — VITAMIN B12: VITAMIN B 12: 602 pg/mL (ref 180–914)

## 2018-07-09 LAB — FERRITIN: Ferritin: 10 ng/mL — ABNORMAL LOW (ref 11–307)

## 2018-07-09 LAB — ETHANOL

## 2018-07-09 LAB — LIPASE, BLOOD: LIPASE: 40 U/L (ref 11–51)

## 2018-07-09 MED ORDER — SORBITOL 70 % SOLN
30.0000 mL | Freq: Every day | Status: DC | PRN
  Filled 2018-07-09: qty 30

## 2018-07-09 MED ORDER — POTASSIUM CHLORIDE CRYS ER 20 MEQ PO TBCR
40.0000 meq | EXTENDED_RELEASE_TABLET | Freq: Once | ORAL | Status: AC
  Administered 2018-07-09: 40 meq via ORAL
  Filled 2018-07-09: qty 2

## 2018-07-09 MED ORDER — TRAMADOL HCL 50 MG PO TABS: 50.0000 mg | ORAL_TABLET | Freq: Four times a day (QID) | ORAL | Status: DC | PRN

## 2018-07-09 MED ORDER — METHOCARBAMOL 500 MG PO TABS
1000.0000 mg | ORAL_TABLET | Freq: Three times a day (TID) | ORAL | Status: DC | PRN
  Administered 2018-07-10 – 2018-07-13 (×6): 1000 mg via ORAL
  Filled 2018-07-09 (×6): qty 2

## 2018-07-09 MED ORDER — SODIUM CHLORIDE 0.9 % IV SOLN
INTRAVENOUS | Status: DC
Start: 2018-07-09 — End: 2018-07-10
  Administered 2018-07-09 – 2018-07-10 (×2): via INTRAVENOUS

## 2018-07-09 MED ORDER — FOLIC ACID 1 MG PO TABS
1.0000 mg | ORAL_TABLET | Freq: Every day | ORAL | Status: DC
  Administered 2018-07-09 – 2018-07-15 (×6): 1 mg via ORAL
  Filled 2018-07-09 (×6): qty 1

## 2018-07-09 MED ORDER — FERROUS SULFATE 325 (65 FE) MG PO TABS
325.0000 mg | ORAL_TABLET | Freq: Two times a day (BID) | ORAL | Status: DC
  Administered 2018-07-10 – 2018-07-15 (×11): 325 mg via ORAL
  Filled 2018-07-09 (×12): qty 1

## 2018-07-09 MED ORDER — SODIUM CHLORIDE 0.9 % IV SOLN
INTRAVENOUS | Status: DC
  Administered 2018-07-09: 16:00:00 via INTRAVENOUS

## 2018-07-09 MED ORDER — ALBUTEROL SULFATE HFA 108 (90 BASE) MCG/ACT IN AERS: 2.0000 | INHALATION_SPRAY | RESPIRATORY_TRACT | Status: DC | PRN

## 2018-07-09 MED ORDER — GABAPENTIN 400 MG PO CAPS
400.0000 mg | ORAL_CAPSULE | Freq: Three times a day (TID) | ORAL | Status: DC
  Administered 2018-07-09 – 2018-07-15 (×18): 400 mg via ORAL
  Filled 2018-07-09 (×18): qty 1

## 2018-07-09 MED ORDER — LORAZEPAM 2 MG/ML IJ SOLN: 1.0000 mg | Freq: Four times a day (QID) | INTRAMUSCULAR | Status: AC | PRN

## 2018-07-09 MED ORDER — ACETYLCYSTEINE LOAD VIA INFUSION: 150.0000 mg/kg | Freq: Once | INTRAVENOUS | Status: DC

## 2018-07-09 MED ORDER — ONDANSETRON HCL 4 MG PO TABS: 4.0000 mg | ORAL_TABLET | Freq: Four times a day (QID) | ORAL | Status: DC | PRN

## 2018-07-09 MED ORDER — MAGNESIUM SULFATE 4 GM/100ML IV SOLN
4.0000 g | Freq: Once | INTRAVENOUS | Status: AC
  Administered 2018-07-09: 4 g via INTRAVENOUS
  Filled 2018-07-09: qty 100

## 2018-07-09 MED ORDER — ONDANSETRON HCL 4 MG/2ML IJ SOLN: 4.0000 mg | Freq: Four times a day (QID) | INTRAMUSCULAR | Status: DC | PRN

## 2018-07-09 MED ORDER — VITAMIN B-1 100 MG PO TABS
100.0000 mg | ORAL_TABLET | Freq: Every day | ORAL | Status: DC
  Administered 2018-07-09 – 2018-07-15 (×6): 100 mg via ORAL
  Filled 2018-07-09 (×7): qty 1

## 2018-07-09 MED ORDER — LORAZEPAM 1 MG PO TABS
1.0000 mg | ORAL_TABLET | Freq: Four times a day (QID) | ORAL | Status: AC | PRN
  Administered 2018-07-09: 1 mg via ORAL
  Filled 2018-07-09 (×2): qty 1

## 2018-07-09 MED ORDER — FLEET ENEMA 7-19 GM/118ML RE ENEM: 1.0000 | ENEMA | Freq: Once | RECTAL | Status: DC | PRN

## 2018-07-09 MED ORDER — SODIUM CHLORIDE 0.9 % IV BOLUS
1000.0000 mL | Freq: Once | INTRAVENOUS | Status: AC
  Administered 2018-07-09: 1000 mL via INTRAVENOUS

## 2018-07-09 MED ORDER — CITALOPRAM HYDROBROMIDE 20 MG PO TABS
10.0000 mg | ORAL_TABLET | Freq: Every day | ORAL | Status: DC
  Administered 2018-07-10 – 2018-07-15 (×6): 10 mg via ORAL
  Filled 2018-07-09 (×7): qty 1

## 2018-07-09 MED ORDER — AMLODIPINE BESYLATE 5 MG PO TABS
5.0000 mg | ORAL_TABLET | Freq: Every day | ORAL | Status: DC
  Administered 2018-07-10: 5 mg via ORAL
  Filled 2018-07-09: qty 1

## 2018-07-09 MED ORDER — LORAZEPAM 2 MG/ML IJ SOLN
0.0000 mg | Freq: Two times a day (BID) | INTRAMUSCULAR | Status: AC
  Administered 2018-07-11: 1 mg via INTRAVENOUS
  Administered 2018-07-12: 2 mg via INTRAVENOUS
  Administered 2018-07-12 – 2018-07-13 (×2): 1 mg via INTRAVENOUS
  Filled 2018-07-09 (×5): qty 1

## 2018-07-09 MED ORDER — ACETYLCYSTEINE LOAD VIA INFUSION
150.0000 mg/kg | Freq: Once | INTRAVENOUS | Status: AC
Start: 2018-07-09 — End: 2018-07-09
  Administered 2018-07-09: 7140 mg via INTRAVENOUS
  Filled 2018-07-09: qty 179

## 2018-07-09 MED ORDER — MOMETASONE FURO-FORMOTEROL FUM 100-5 MCG/ACT IN AERO
2.0000 | INHALATION_SPRAY | Freq: Two times a day (BID) | RESPIRATORY_TRACT | Status: DC
  Administered 2018-07-09 – 2018-07-12 (×6): 2 via RESPIRATORY_TRACT
  Filled 2018-07-09: qty 8.8

## 2018-07-09 MED ORDER — NICOTINE 14 MG/24HR TD PT24
14.0000 mg | MEDICATED_PATCH | TRANSDERMAL | Status: DC
  Administered 2018-07-09 – 2018-07-14 (×6): 14 mg via TRANSDERMAL
  Filled 2018-07-09 (×6): qty 1

## 2018-07-09 MED ORDER — THIAMINE HCL 100 MG/ML IJ SOLN
100.0000 mg | Freq: Every day | INTRAMUSCULAR | Status: DC
  Filled 2018-07-09: qty 2

## 2018-07-09 MED ORDER — IPRATROPIUM-ALBUTEROL 0.5-2.5 (3) MG/3ML IN SOLN: 3.0000 mL | RESPIRATORY_TRACT | Status: DC | PRN

## 2018-07-09 MED ORDER — LORAZEPAM 2 MG/ML IJ SOLN
0.0000 mg | Freq: Four times a day (QID) | INTRAMUSCULAR | Status: AC
  Administered 2018-07-10 (×2): 2 mg via INTRAVENOUS
  Administered 2018-07-10: 1 mg via INTRAVENOUS
  Filled 2018-07-09 (×4): qty 1

## 2018-07-09 MED ORDER — DEXTROSE 5 % IV SOLN
15.0000 mg/kg/h | INTRAVENOUS | Status: DC
  Administered 2018-07-10: 15 mg/kg/h via INTRAVENOUS
  Filled 2018-07-09: qty 200

## 2018-07-09 MED ORDER — SENNOSIDES-DOCUSATE SODIUM 8.6-50 MG PO TABS
1.0000 | ORAL_TABLET | Freq: Two times a day (BID) | ORAL | Status: DC
  Administered 2018-07-09 – 2018-07-15 (×11): 1 via ORAL
  Filled 2018-07-09 (×12): qty 1

## 2018-07-09 MED ORDER — ADULT MULTIVITAMIN W/MINERALS CH
1.0000 | ORAL_TABLET | Freq: Every day | ORAL | Status: DC
Start: 2018-07-09 — End: 2018-07-15
  Administered 2018-07-09 – 2018-07-15 (×6): 1 via ORAL
  Filled 2018-07-09 (×7): qty 1

## 2018-07-09 MED ORDER — LIDOCAINE 5 % EX PTCH
1.0000 | MEDICATED_PATCH | CUTANEOUS | Status: DC
  Administered 2018-07-09: 1 via TRANSDERMAL
  Filled 2018-07-09: qty 1

## 2018-07-09 MED ORDER — PANTOPRAZOLE SODIUM 40 MG PO TBEC
40.0000 mg | DELAYED_RELEASE_TABLET | Freq: Two times a day (BID) | ORAL | Status: DC
  Administered 2018-07-09 – 2018-07-15 (×13): 40 mg via ORAL
  Filled 2018-07-09 (×13): qty 1

## 2018-07-09 NOTE — ED Provider Notes (Signed)
[facility] DEPT Provider Note   CSN: [id number] Arrival date & time: 07/09/18  1453     History   Chief Complaint Chief Complaint  Patient presents with  . Drug Overdose    SI    HPI Melissa Bridges is a 57 y.o. female.  Patient with hx substance abuse, presents c/o taking approximately 15 acetaminophen tablets yesterday, and another 60 throughout course of morning today for 'back pain', but also b/c daughter recently told didn't want her to come to graduation as pt has been abusing cocaine. Patient denies other ingestion. Denies etoh abuse. Pt is poor/difficult historian, is unsure of exact times when she took the acetaminophen - level 5 caveat. No abd pain. No nausea or vomiting.   The history is provided by the patient and the EMS personnel. The history is limited by the condition of the patient.  Drug Overdose  Pertinent negatives include no chest pain, no abdominal pain, no headaches and no shortness of breath.    Past Medical History:  Diagnosis Date  . Allergy   . Alopecia   . Anxiety   . Arthritis   . Asthma   . Cataract    "mild"  . COPD (chronic obstructive pulmonary disease) ([place])   . Depression   . GERD (gastroesophageal reflux disease)    o cc- takes OTC if needed.  . Glaucoma   . Hepatitis C   . Hypertension    onset age 38.  . Iron deficiency anemia   . Migraine   . Schizoaffective disorder ([place])   . Sciatic pain   . Seizures ([place])    onset in childhood. 06-03-16- per pt 8 months agoGeneralized tonic-clonic.Last seizure couple of months ago- last sz 9-10 months ago per pt 10-12-2016  . Shortness of breath dyspnea   . Substance abuse ([place])   . Ulcer     Patient Active Problem List   Diagnosis Date Noted  . Cocaine abuse ([place]) 05/24/2018  . Hypomagnesemia 05/24/2018  . Chronic left hip pain 05/24/2018  . Hypokalemia 05/23/2018  . Tylenol overdose 05/23/2018  . COPD (chronic obstructive pulmonary disease) ([place])  05/23/2018  . Serrated adenoma of colon 08/05/2016  . Localization-related idiopathic epilepsy and epileptic syndromes with seizures of localized onset, not intractable, without status epilepticus ([place]) 10/16/2015  . 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 Seizure disorder ([place]) 09/09/2014  . Asthma with acute exacerbation 03/09/2013  . Hypertension 03/09/2013  . Lower back pain 03/09/2013  . Chronic hepatitis C without hepatic coma ([place]) 03/08/2013  . Smoker 12/12/2012  . Schizoaffective disorder ([place]) 12/12/2012    Past Surgical History:  Procedure Laterality Date  . ABDOMINAL HYSTERECTOMY     ovarian cyst B, cervical dysplasia, fibroids.   Ovaries intact.  . COLONOSCOPY    . COLONOSCOPY W/ BIOPSIES    . DILATION AND CURETTAGE OF UTERUS     x2  . EXPLORATORY LAPAROTOMY     x3  . GYNECOLOGIC CRYOSURGERY     x3  . MANDIBLE RECONSTRUCTION N/A 06/27/2015   Procedure: REMOVAL MAXILLARY PALATAL TORUS.  REMOVAL MANDIBULAR TORUS AND EXOSTOSIS.  ;  Surgeon: Diona Browner, DDS;  Location: [facility];  Service: Oral Surgery;  Laterality: N/A;  . OVARIAN CYST REMOVAL  2008  . POLYPECTOMY       OB History   None      Home Medications    Prior to Admission medications   Medication Sig Start Date End Date Taking? Authorizing Provider  albuterol (  VENTOLIN HFA) 108 (90 Base) MCG/ACT inhaler INHALE TWO PUFFS BY MOUTH EVERY 6 HOURS AS NEEDED FOR SHORTNESS OF BREATH 05/26/18   Jaynee Eagles, PA-C  amLODipine (NORVASC) 5 MG tablet Take 1 tablet (5 mg total) by mouth daily. 06/27/18   Wardell Honour, MD  budesonide-formoterol [facility]) 80-4.5 MCG/ACT inhaler Inhale 2 puffs into the lungs 2 (two) times daily. 06/13/18   Wardell Honour, MD  citalopram (CELEXA) 10 MG tablet Take 1 tablet (10 mg total) by mouth daily. For depression 05/26/18   Jaynee Eagles, PA-C  diclofenac sodium (VOLTAREN) 1 % GEL Apply 2 g topically 4 (four) times daily. 05/24/18   Debbe Odea, MD  DULoxetine (CYMBALTA) 30 MG capsule  Take by mouth. 06/17/18   [provider]  ferrous sulfate 325 (65 FE) MG tablet Take 1 tablet (325 mg total) by mouth 2 (two) times daily with a meal. 07/05/18   Wardell Honour, MD  gabapentin (NEURONTIN) 400 MG capsule Take 1 capsule (400 mg total) by mouth 3 (three) times daily. 06/13/18   Wardell Honour, MD  ipratropium-albuterol (DUONEB) 0.5-2.5 (3) MG/3ML SOLN Take 3 mLs by nebulization every 4 (four) hours as needed (shortness of breath). Reported on 12/02/2015    [provider]  LamoTRIgine (LAMICTAL XR) 25 MG TB24 24 hour tablet take 1 tablet daily for 2 weeks, then increase to 2 tablets daily for 2 weeks 05/24/18   Debbe Odea, MD  methocarbamol (ROBAXIN) 500 MG tablet Take 2 tablets (1,000 mg total) by mouth every 8 (eight) hours as needed for muscle spasms. 05/24/18   Debbe Odea, MD  Misc. Devices ([place]) MISC Sig: one rolling walker any name brand with ambulation. DDD lumbar spine 07/05/18   Wardell Honour, MD  QUEtiapine (SEROQUEL) 300 MG tablet Take 1 tablet (300 mg total) by mouth at bedtime. Mood control 05/26/18   Jaynee Eagles, PA-C  triamcinolone cream (KENALOG) 0.1 % APPLY  CREAM EXTERNALLY TWICE DAILY 09/02/17   Wardell Honour, MD  valACYclovir (VALTREX) 1000 MG tablet Take 2 tabs po at symptom onset, repeat in 12 hours Patient taking differently: Take 2,000 mg by mouth daily as needed (flare ups).  07/29/17   Wardell Honour, MD    Family History Family History  Problem Relation Age of Onset  . Diabetes type II Mother   . Hypertension Mother   . Arthritis Mother   . Diabetes Mother   . Mental illness Mother        bipolar  . Diabetes type II Maternal Aunt   . Hypertension Father   . Heart murmur Father   . Arthritis Father   . Stroke Father   . Alopecia Sister   . Alopecia Brother   . Alopecia Sister   . Mental retardation Sister        depression  . Prostate cancer Paternal Grandfather   . Cancer Maternal Uncle   . Colon cancer  Neg Hx   . Esophageal cancer Neg Hx   . Rectal cancer Neg Hx   . Stomach cancer Neg Hx   . Colon polyps Neg Hx     Social History Social History   Tobacco Use  . Smoking status: Current Every Day Smoker    Packs/day: 0.33    Years: 44.00    Pack years: 14.52    Types: Cigarettes  . Smokeless tobacco: Never Used  Substance Use Topics  . Alcohol use: Yes    Alcohol/week: 0.0 oz  Comment: occasionally  . Drug use: Yes    Types: Marijuana, Cocaine     Allergies   Fruit & vegetable daily [nutritional supplements] and Iodinated diagnostic agents   Review of Systems Review of Systems  Constitutional: Negative for fever.  HENT: Negative for sore throat.   Eyes: Negative for redness.  Respiratory: Negative for shortness of breath.   Cardiovascular: Negative for chest pain.  Gastrointestinal: Negative for abdominal pain, nausea and vomiting.  Genitourinary: Negative for flank pain.  Musculoskeletal: Negative for back pain and neck pain.  Skin: Negative for rash.  Neurological: Negative for headaches.  Hematological: Does not bruise/bleed easily.  Psychiatric/Behavioral: Positive for dysphoric mood and suicidal ideas. Negative for confusion.     Physical Exam Updated Vital Signs BP (!) 160/91 (BP Location: Right Arm)   Pulse 82   Temp 97.9 F (36.6 C)   Resp (!) 22   Ht 1.626 m (5\' 4" )   Wt 47.6 kg (105 lb)   SpO2 99%   BMI 18.02 kg/m   Physical Exam  Constitutional: She appears well-developed and well-nourished.  HENT:  Head: Atraumatic.  Mouth/Throat: Oropharynx is clear and moist.  Eyes: Pupils are equal, round, and reactive to light. Conjunctivae are normal. No scleral icterus.  Neck: Neck supple. No tracheal deviation present.  Cardiovascular: Normal rate, regular rhythm, normal heart sounds and intact distal pulses. Exam reveals no gallop and no friction rub.  No murmur heard. Pulmonary/Chest: Effort normal and breath sounds normal. No respiratory  distress.  Abdominal: Soft. Normal appearance and bowel sounds are normal. She exhibits no distension and no mass. There is no tenderness. There is no guarding.  Genitourinary:  Genitourinary Comments: No cva tenderness  Musculoskeletal: She exhibits no edema or tenderness.  Neurological: She is alert.  Skin: Skin is warm and dry. No rash noted.  Psychiatric:  Depressed mood, flat affect.   Nursing note and vitals reviewed.    ED Treatments / Results  Labs (all labs ordered are listed, but only abnormal results are displayed) Results for orders placed or performed during the hospital encounter of 07/09/18  CBC  Result Value Ref Range   WBC 4.8 4.0 - 10.5 K/uL   RBC 3.40 (L) 3.87 - 5.11 MIL/uL   Hemoglobin 9.8 (L) 12.0 - 15.0 g/dL   HCT 30.3 (L) 36.0 - 46.0 %   MCV 89.1 78.0 - 100.0 fL   MCH 28.8 26.0 - 34.0 pg   MCHC 32.3 30.0 - 36.0 g/dL   RDW 21.9 (H) 11.5 - 15.5 %   Platelets 245 150 - 400 K/uL  Comprehensive metabolic panel  Result Value Ref Range   Sodium 140 135 - 145 mmol/L   Potassium 3.3 (L) 3.5 - 5.1 mmol/L   Chloride 105 98 - 111 mmol/L   CO2 25 22 - 32 mmol/L   Glucose, Bld 105 (H) 70 - 99 mg/dL   BUN 10 6 - 20 mg/dL   Creatinine, Ser 1.39 (H) 0.44 - 1.00 mg/dL   Calcium 9.9 8.9 - 10.3 mg/dL   Total Protein 8.7 (H) 6.5 - 8.1 g/dL   Albumin 4.4 3.5 - 5.0 g/dL   AST 73 (H) 15 - 41 U/L   ALT 39 0 - 44 U/L   Alkaline Phosphatase 74 38 - 126 U/L   Total Bilirubin 0.7 0.3 - 1.2 mg/dL   GFR calc non Af Amer 41 (L) >60 mL/min   GFR calc Af Amer 47 (L) >60 mL/min   Anion gap  10 5 - 15  Ethanol  Result Value Ref Range   Alcohol, Ethyl (B) <10 <10 mg/dL  Rapid urine drug screen (hospital performed)  Result Value Ref Range   Opiates NONE DETECTED NONE DETECTED   Cocaine POSITIVE (A) NONE DETECTED   Benzodiazepines NONE DETECTED NONE DETECTED   Amphetamines NONE DETECTED NONE DETECTED   Tetrahydrocannabinol NONE DETECTED NONE DETECTED   Barbiturates NONE  DETECTED NONE DETECTED  Pregnancy, urine  Result Value Ref Range   Preg Test, Ur NEGATIVE NEGATIVE  Salicylate level  Result Value Ref Range   Salicylate Lvl <5.4 2.8 - 30.0 mg/dL  Acetaminophen level  Result Value Ref Range   Acetaminophen (Tylenol), Serum 131 (H) 10 - 30 ug/mL  Protime-INR  Result Value Ref Range   Prothrombin Time 12.8 11.4 - 15.2 seconds   INR 0.97   CBG monitoring, ED  Result Value Ref Range   Glucose-Capillary 112 (H) 70 - 99 mg/dL   EKG EKG Interpretation  Date/Time:  Sunday July 09 2018 15:04:40 EDT Ventricular Rate:  83 PR Interval:    QRS Duration: 90 QT Interval:  412 QTC Calculation: 485 R Axis:   46 Text Interpretation:  Sinus rhythm Probable left atrial enlargement Left ventricular hypertrophy Confirmed by Lajean Saver [phone]) on 07/09/2018 3:10:41 PM   Radiology No results found.  Procedures Procedures (including critical care time)  Medications Ordered in ED Medications  0.9 %  sodium chloride infusion (has no administration in time range)     Initial Impression / Assessment and Plan / ED Course  I have reviewed the triage vital signs and the nursing notes.  Pertinent labs & imaging results that were available during my care of the patient were reviewed by me and considered in my medical decision making (see chart for details).  Iv ns. Labs.   Continuous pulse ox and monitor. Ecg.   Poison control consulted.   Reviewed nursing notes and prior charts for additional history.   Labs reviewed - acetaminophen level high 113, and pt started taking acetaminophen approximately 20 hrs ago, so potentially in toxic range.   Acetylcysteine load and infusion ordered.  Pharmacy consulted for med management.   Recheck, alert, awake, no vomiting, abd soft nt.   Hospitalist consulted for admission - discussed w Dr Grandville Silos - will admit and get inpatient psych consult.   CRITICAL CARE RE: acetaminophen poisoning/intentional requiring  acetylcysteine therapy, suicidal ideation, Performed by: Mirna Mires Total critical care time: 40 minutes Critical care time was exclusive of separately billable procedures and treating other patients. Critical care was necessary to treat or prevent imminent or life-threatening deterioration. Critical care was time spent personally by me on the following activities: development of treatment plan with patient and/or surrogate as well as nursing, discussions with consultants, evaluation of patient's response to treatment, examination of patient, obtaining history from patient or surrogate, ordering and performing treatments and interventions, ordering and review of laboratory studies, ordering and review of radiographic studies, pulse oximetry and re-evaluation of patient's condition.   Final Clinical Impressions(s) / ED Diagnoses   Final diagnoses:  None    ED Discharge Orders    None       Lajean Saver, MD 07/09/18 1655

## 2018-07-09 NOTE — ED Notes (Signed)
Bed: RESB Expected date: 07/09/18 Expected time: 2:43 PM Means of arrival: Ambulance Comments: OD Tylenol SI attempt

## 2018-07-09 NOTE — ED Notes (Signed)
Bed: WA16 Expected date:  Expected time:  Means of arrival:  Comments: od 

## 2018-07-09 NOTE — ED Notes (Signed)
Report given to Ginger RN.

## 2018-07-09 NOTE — Progress Notes (Addendum)
Pharmacy - Acetylcysteine for APAP overdose  1 YOF with Suicide attempt via APAP ingestion. Also using cocaine.  Apparently ingested ~15 APAP tabs yesterday and ~60 tabs this am.  APAP level at time of admission elevated at 131.  EDP has consulted with [place] poison control per his note.  IV acetylcysteine bolus and infusion ordered.  Per outpatient notes patient unintentionally overdosing on APAP d/t pain for approx last month.  LFT were significantly elevated on 7/2 and have trended down, she received acetylcysteine as outpatient 7/2  Labs at admission: INR = 0.97 AST = 73 - elevated earlier this month and actually improved  ALT = 39   Plan  Acetylcysteine 150mg /kg bolus then 15mg /hr/hr  Check 22h AST/ALT, PT/INR, and APAP level  F/u with poison control with these results  Doreene Eland, PharmD, BCPS.   Pager: [phone] 07/09/2018 5:01 PM

## 2018-07-09 NOTE — Progress Notes (Signed)
Pt. Was complaining of her back pain and Lidocaine patch was applied.After a 10 minutes  she complained of a burning sensation and itchy and asked for removal of the patch.This nurse  removed  The lidocaine patch and skin  under the it assessed. Skin is  intact ,no redness and rash noted.

## 2018-07-09 NOTE — H&P (Signed)
History and Physical    Melissa Bridges:[id number] DOB: 03-26-1961 DOA: 07/09/2018  PCP: Forrest Moron, MD  Patient coming from: Home  I have personally briefly reviewed patient's old medical records in [place]  Chief Complaint: Took too many pills, this time was trying to kill myself.  HPI: Melissa Bridges is a 57 y.o. female with medical history significant of polysubstance abuse including crack cocaine, COPD, history of seizures, hepatitis C, schizoaffective disorder, lumbar disc herniation presented to the ED with Tylenol overdose.  Patient is a poor historian and as such history obtained both from patient and per ED physician.  Patient stated that she had a prior history of crack cocaine use and was clean for 5 years however recently relapsed and has used cocaine 3 times recently last use was the night prior to admission.  Patient does state that she took many pills of Tylenol to help with back pain and as well this time around she was trying to kill herself as her granddaughter recently told her that she did not want her to come to her graduation as patient was abusing cocaine.  Per ED physician's note patient took approximately 15 Tylenol tablets one day prior to admission each 500 mg and another 60 tablets throughout the course of the morning on the day of admission.  Patient does endorse low-grade fever of 100.1, hot and cold chills, no change in chronic back pain which she states she is supposed to follow-up with her neurosurgeon for, chronic shortness of breath from asthma and COPD which is unchanged, dysuria, black stools.  Patient denies any nausea or vomiting, no constipation, no diarrhea, no chest pain.  Patient does endorse diffuse upper abdominal pain.  Patient does endorse generalized weakness from her back pain.  Patient denies any hematochezia, no hematemesis.  Patient denies any syncopal episodes, no recent visual abnormalities, no asymmetric weakness or numbness.    ED  Course: Patient seen in the ED, compressive metabolic profile obtained with a potassium of 3.3, creatinine of 1.39, glucose of 105, AST of 73, protein of 8.7 otherwise was within normal limits.  CBC had a hemoglobin of 9.8 otherwise was within normal limits.  Urine pregnancy test was negative.  Acetaminophen level was 131.  Salicylate level was less than 7.  INR of 0.97.  PT of 12.8.  UDS was positive for cocaine.  EKG with normal sinus rhythm, LVH.  Patient placed on Mucomyst and given IV fluids.  Review of Systems: As per HPI otherwise 10 point review of systems negative.   Past Medical History:  Diagnosis Date  . Allergy   . Alopecia   . Anxiety   . Arthritis   . Asthma   . Cataract    "mild"  . COPD (chronic obstructive pulmonary disease) ([place])   . Depression   . GERD (gastroesophageal reflux disease)    o cc- takes OTC if needed.  . Glaucoma   . Hepatitis C   . Hypertension    onset age 31.  . Iron deficiency anemia   . Migraine   . Schizoaffective disorder ([place])   . Sciatic pain   . Seizures ([place])    onset in childhood. 06-03-16- per pt 8 months agoGeneralized tonic-clonic.Last seizure couple of months ago- last sz 9-10 months ago per pt 10-12-2016  . Shortness of breath dyspnea   . Substance abuse ([place])   . Ulcer     Past Surgical History:  Procedure Laterality Date  . ABDOMINAL HYSTERECTOMY  ovarian cyst B, cervical dysplasia, fibroids.   Ovaries intact.  . COLONOSCOPY    . COLONOSCOPY W/ BIOPSIES    . DILATION AND CURETTAGE OF UTERUS     x2  . EXPLORATORY LAPAROTOMY     x3  . GYNECOLOGIC CRYOSURGERY     x3  . MANDIBLE RECONSTRUCTION N/A 06/27/2015   Procedure: REMOVAL MAXILLARY PALATAL TORUS.  REMOVAL MANDIBULAR TORUS AND EXOSTOSIS.  ;  Surgeon: Diona Browner, DDS;  Location: [facility];  Service: Oral Surgery;  Laterality: N/A;  . OVARIAN CYST REMOVAL  2008  . POLYPECTOMY       reports that she has been smoking cigarettes.  She has a 14.52 pack-year smoking  history. She has never used smokeless tobacco. She reports that she drinks alcohol. She reports that she has current or past drug history. Drugs: Marijuana and Cocaine.  Allergies  Allergen Reactions  . Fruit & Vegetable Daily [Nutritional Supplements] Shortness Of Breath    Aloe  . Iodinated Diagnostic Agents Swelling    Swelling and itching of left side of her face only after CT SI injection(no steroid used)  . Aloe Vera Rash    Family History  Problem Relation Age of Onset  . Diabetes type II Mother   . Hypertension Mother   . Arthritis Mother   . Diabetes Mother   . Mental illness Mother        bipolar  . Diabetes type II Maternal Aunt   . Hypertension Father   . Heart murmur Father   . Arthritis Father   . Stroke Father   . Alopecia Sister   . Alopecia Brother   . Alopecia Sister   . Mental retardation Sister        depression  . Prostate cancer Paternal Grandfather   . Cancer Maternal Uncle   . Colon cancer Neg Hx   . Esophageal cancer Neg Hx   . Rectal cancer Neg Hx   . Stomach cancer Neg Hx   . Colon polyps Neg Hx    Mother alive age 18 with dementia and mental issues per patient.  Father alive age 76 per patient with a hole in his heart and takes blood thinners.  Prior to Admission medications   Medication Sig Start Date End Date Taking? Authorizing Provider  acetaminophen (TYLENOL) 500 MG tablet Take 500 mg by mouth once.   Yes [provider]  albuterol (VENTOLIN HFA) 108 (90 Base) MCG/ACT inhaler INHALE TWO PUFFS BY MOUTH EVERY 6 HOURS AS NEEDED FOR SHORTNESS OF BREATH 05/26/18  Yes Jaynee Eagles, PA-C  amLODipine (NORVASC) 5 MG tablet Take 1 tablet (5 mg total) by mouth daily. 06/27/18  Yes Wardell Honour, MD  budesonide-formoterol [facility]) 80-4.5 MCG/ACT inhaler Inhale 2 puffs into the lungs 2 (two) times daily. 06/13/18  Yes Wardell Honour, MD  citalopram (CELEXA) 10 MG tablet Take 1 tablet (10 mg total) by mouth daily. For depression 05/26/18  Yes  Jaynee Eagles, PA-C  diphenhydrAMINE-APAP, sleep, (TYLENOL PM EXTRA STRENGTH PO) Take 500 mg by mouth once.    Yes [provider]  ferrous sulfate 325 (65 FE) MG tablet Take 1 tablet (325 mg total) by mouth 2 (two) times daily with a meal. 07/05/18  Yes Wardell Honour, MD  gabapentin (NEURONTIN) 400 MG capsule Take 1 capsule (400 mg total) by mouth 3 (three) times daily. 06/13/18  Yes Wardell Honour, MD  methocarbamol (ROBAXIN) 500 MG tablet Take 2 tablets (1,000 mg total) by mouth  every 8 (eight) hours as needed for muscle spasms. 05/24/18  Yes Debbe Odea, MD  QUEtiapine (SEROQUEL) 300 MG tablet Take 1 tablet (300 mg total) by mouth at bedtime. Mood control 05/26/18  Yes Jaynee Eagles, PA-C  diclofenac sodium (VOLTAREN) 1 % GEL Apply 2 g topically 4 (four) times daily. Patient not taking: Reported on 07/09/2018 05/24/18   Debbe Odea, MD  ipratropium-albuterol (DUONEB) 0.5-2.5 (3) MG/3ML SOLN Take 3 mLs by nebulization every 4 (four) hours as needed (shortness of breath). Reported on 12/02/2015    [provider]  LamoTRIgine (LAMICTAL XR) 25 MG TB24 24 hour tablet take 1 tablet daily for 2 weeks, then increase to 2 tablets daily for 2 weeks 05/24/18   Debbe Odea, MD  Misc. Devices ([place]) MISC Sig: one rolling walker any name brand with ambulation. DDD lumbar spine 07/05/18   Wardell Honour, MD  triamcinolone cream (KENALOG) 0.1 % APPLY  CREAM EXTERNALLY TWICE DAILY Patient not taking: Reported on 07/09/2018 09/02/17   Wardell Honour, MD  valACYclovir (VALTREX) 1000 MG tablet Take 2 tabs po at symptom onset, repeat in 12 hours Patient taking differently: Take 2,000 mg by mouth daily as needed (flare ups).  07/29/17   Wardell Honour, MD    Physical Exam: Vitals:   07/09/18 1530 07/09/18 1630 07/09/18 1700 07/09/18 1800  BP: (!) 163/70 (!) 155/69 (!) 135/54 (!) 176/85  Pulse: 82 89 75 78  Resp: 14 (!) 25 (!) 23 (!) 23  Temp:      SpO2: 100% 94% 100% 100%    Weight:      Height:        Constitutional: NAD, calm, comfortable Vitals:   07/09/18 1530 07/09/18 1630 07/09/18 1700 07/09/18 1800  BP: (!) 163/70 (!) 155/69 (!) 135/54 (!) 176/85  Pulse: 82 89 75 78  Resp: 14 (!) 25 (!) 23 (!) 23  Temp:      SpO2: 100% 94% 100% 100%  Weight:      Height:       Eyes: PERRL, lids and conjunctivae normal ENMT: Mucous membranes are dry. Posterior pharynx clear of any exudate or lesions. Poor dentition.  Neck: normal, supple, no masses, no thyromegaly Respiratory: clear to auscultation bilaterally, no wheezing, no crackles. Normal respiratory effort. No accessory muscle use.  Cardiovascular: Regular rate and rhythm, no murmurs / rubs / gallops. No extremity edema. 2+ pedal pulses. No carotid bruits.  Abdomen: Exquisitely tender in the epigastric region and upper abdomen.  Soft, nondistended, positive bowel sounds.  No rebound.  No guarding.   Musculoskeletal: no clubbing / cyanosis. No joint deformity upper and lower extremities. Good ROM, no contractures. Normal muscle tone.  Skin: no rashes, lesions, ulcers. No induration Neurologic: CN 2-12 grossly intact. Sensation intact, DTR normal. Strength 5/5 in bilateral upper extremities.  5/5 right lower extremity.  4/5 left lower extremity strength.   Psychiatric: Normal judgment and insight. Alert and oriented x 3. Normal mood.   Labs on Admission: I have personally reviewed following labs and imaging studies  CBC: Recent Labs  Lab 07/05/18 1610 07/09/18 1516  WBC 8.9 4.8  NEUTROABS 5.4  --   HGB 9.9* 9.8*  HCT 32.0* 30.3*  MCV 92 89.1  PLT 311 254   Basic Metabolic Panel: Recent Labs  Lab 07/05/18 1610 07/09/18 1516 07/09/18 1654  NA 138 140  --   K 3.9 3.3*  --   CL 98 105  --   CO2 23  25  --   GLUCOSE 106* 105*  --   BUN 11 10  --   CREATININE 1.09* 1.39*  --   CALCIUM 10.5* 9.9  --   MG  --   --  1.5*   GFR: Estimated Creatinine Clearance: 32.7 mL/min (A) (by C-G formula  based on SCr of 1.39 mg/dL (H)). Liver Function Tests: Recent Labs  Lab 07/05/18 1610 07/09/18 1516  AST 76* 73*  ALT 42* 39  ALKPHOS 96 74  BILITOT 0.4 0.7  PROT 8.7* 8.7*  ALBUMIN 4.8 4.4   No results for input(s): LIPASE, AMYLASE in the last 168 hours. No results for input(s): AMMONIA in the last 168 hours. Coagulation Profile: Recent Labs  Lab 07/09/18 1516  INR 0.97   Cardiac Enzymes: No results for input(s): CKTOTAL, CKMB, CKMBINDEX, TROPONINI in the last 168 hours. BNP (last 3 results) No results for input(s): PROBNP in the last 8760 hours. HbA1C: No results for input(s): HGBA1C in the last 72 hours. CBG: Recent Labs  Lab 07/09/18 1509  GLUCAP 112*   Lipid Profile: No results for input(s): CHOL, HDL, LDLCALC, TRIG, CHOLHDL, LDLDIRECT in the last 72 hours. Thyroid Function Tests: No results for input(s): TSH, T4TOTAL, FREET4, T3FREE, THYROIDAB in the last 72 hours. Anemia Panel: No results for input(s): VITAMINB12, FOLATE, FERRITIN, TIBC, IRON, RETICCTPCT in the last 72 hours. Urine analysis:    Component Value Date/Time   COLORURINE YELLOW 05/25/2018 2142   APPEARANCEUR CLEAR 05/25/2018 2142   LABSPEC 1.010 05/25/2018 2142   PHURINE 6.0 05/25/2018 2142   GLUCOSEU NEGATIVE 05/25/2018 2142   HGBUR SMALL (A) 05/25/2018 2142   BILIRUBINUR negative 06/13/2018 0949   BILIRUBINUR neg 06/10/2015 1154   KETONESUR negative 06/13/2018 0949   KETONESUR 5 (A) 05/25/2018 2142   PROTEINUR negative 06/13/2018 0949   PROTEINUR NEGATIVE 05/25/2018 2142   UROBILINOGEN 0.2 06/13/2018 0949   UROBILINOGEN 0.2 08/25/2015 2049   NITRITE Negative 06/13/2018 0949   NITRITE NEGATIVE 05/25/2018 2142   LEUKOCYTESUR Negative 06/13/2018 0949    Radiological Exams on Admission: [place] Ekg Site Rite  Result Date: 07/09/2018 If Site Rite image not attached, placement could not be confirmed due to current cardiac rhythm.   EKG: Independently reviewed.  Normal sinus rhythm with  LVH.  Assessment/Plan Principal Problem:   Tylenol overdose Active Problems:   Suicidal ideation   Smoker   Schizoaffective disorder (HCC)   Chronic hepatitis C without hepatic coma (HCC)   Hypertension   Lower back pain   Seizure disorder (HCC)   Lumbar disc herniation   Hypokalemia   Abdominal pain   COPD (chronic obstructive pulmonary disease) (HCC)   Cocaine abuse (HCC)   Hypomagnesemia   Chronic left hip pain   Anemia   #1 Tylenol overdose/suicidal ideation Patient had presented with Tylenol overdose which she states was to help with her back pain and in addition to kill herself as she was upset that her granddaughter did not want her to come to her graduation as patient had been abusing cocaine.  Patient stated that her granddaughter told her if she went to rehab she could possibly come to her graduation.  Patient presented to the ED Tylenol level was elevated at 131.  Salicylate level was negative.  Comprehensive metabolic profile with a AST of 73 ALT of 39.  INR was 0.97.  UDS was positive for cocaine.  EKG with sinus rhythm with LVH.  Patient received a bolus of IV fluid.  Will place patient on  IV fluids.  Patient started on Mucomyst per ED physician which we will continue.  Monitor INR, LFTs.  Repeat Tylenol level in the morning.  Due to suicidal ideation will consult with psychiatry for further evaluation and management.  2.  Dysuria Check a UA with cultures and sensitivities.  3.  Abdominal pain Patient with diffuse upper abdominal pain which she states is chronic in nature.  Patient states she is supposed to follow-up with a gastroenterologist in the outpatient setting.  Patient on exam has significant upper abdominal pain including epigastric pain.  Patient with a history of alcohol use.  We will get plain films of the abdomen.  Check a lipase level.  Placed on clear liquids.  Follow.  4.  Polysubstance use/cocaine use Patient states she was clean for 5 years however  relapsed recently and has used crack cocaine 3 times last use was the night prior to admission.  Cessation of polysubstance use stressed to patient.  Social work consult.  5.  Alcohol use Patient does endorse alcohol use.  Patient states he drinks as much as he can get a hands on and states he drinks both beer and liquor.  Patient seems currently stable at this time.  Check alcohol level.  Placed on the Ativan withdrawal protocol.  Placed on thiamine, multivitamin, folic acid.  Supportive care.  6.  Hypokalemia/hypomagnesemia Replete.  7.  Schizoaffective disorder Psychiatric consultation for medication management.  8.  Chronic low back pain Patient states she is supposed to follow-up with her neurosurgeon.  Patient with Tylenol overdose and as such cannot placed on Tylenol as needed.  Patient with a history of peptic ulcer disease and now complaining of melanotic stools and as such can placed on any NSAIDs.  Will place on Ultram as needed.  Lidoderm patch.  9.  Black stools/melena Patient with complaints of black stools.  Hemoglobin seems stable.  FOBT.  Check an anemia panel.  Follow H&H.  10.  Anemia Questionable etiology.  CBC with a normocytic anemia.  Check an anemia panel.  FOBT.  Follow H&H.  11.  COPD/tobacco abuse. Stable.  Tobacco cessation.  Placed on a nicotine patch.  Place on [place].  Duo nebs as needed.  12.  History of seizure disorder Patient not on any anticonvulsant medications.  Monitor for now.     #13 hypertension Resume home regimen Norvasc.  DVT prophylaxis: SCDs Code Status: Full Family Communication: Updated patient.  No family at bedside. Disposition Plan: To be determined. Consults called: Psychiatry pending Admission status: Admit to telemetry.   Irine Seal MD [facility] Pager [phone]  If 7PM-7AM, please contact night-coverage [email or web address] Password [facility]  07/09/2018, 6:07 PM

## 2018-07-09 NOTE — ED Notes (Signed)
Bed: RESB Expected date:  Expected time:  Means of arrival:  Comments: od

## 2018-07-09 NOTE — ED Triage Notes (Signed)
Patient presented to ed from home with c/o tylenol overdose. Patient state about 30 minutes she wake up and call 911 because she was not dead because she intended to kill her self by overdosing on tylenol. Patient state she took 15 tab of tylenol last night and roughly 60 tab of tylenol today in the last 12 hours. Patient also state she smoke cocaine.

## 2018-07-09 NOTE — ED Notes (Signed)
ED TO INPATIENT HANDOFF REPORT  Name/Age/Gender Melissa Bridges 57 y.o. female  Code Status Code Status History    Date Active Date Inactive Code Status Order ID Comments User Context   05/23/2018 0918 05/24/2018 2040 Full Code [id number]  Shelly Coss, MD ED   09/06/2014 1817 09/09/2014 2053 Full Code [id number]  Waylan Boga, NP Inpatient   09/05/2014 1903 09/06/2014 1817 Full Code [id number]  Malvin Johns, MD ED   11/11/2011 0432 11/11/2011 1637 Full Code [id number]  Kalman Drape, MD ED      Home/SNF/Other Home  Chief Complaint SI  Level of Care/Admitting Diagnosis ED Disposition    ED Disposition Condition [facility] Area: [facility] [[id number]]  Level of Care: Telemetry [5]  Admit to tele based on following criteria: Monitor QTC interval  Diagnosis: Tylenol overdose [[id number]]  Admitting Physician: Eugenie Filler [place]  Attending Physician: Eugenie Filler [[id number]]  Estimated length of stay: past midnight tomorrow  Certification:: I certify this patient will need inpatient services for at least 2 midnights  PT Class (Do Not Modify): Inpatient [101]  PT Acc Code (Do Not Modify): Private [1]       Medical History Past Medical History:  Diagnosis Date  . Allergy   . Alopecia   . Anxiety   . Arthritis   . Asthma   . Cataract    "mild"  . COPD (chronic obstructive pulmonary disease) ([place])   . Depression   . GERD (gastroesophageal reflux disease)    o cc- takes OTC if needed.  . Glaucoma   . Hepatitis C   . Hypertension    onset age 58.  . Iron deficiency anemia   . Migraine   . Schizoaffective disorder ([place])   . Sciatic pain   . Seizures ([place])    onset in childhood. 06-03-16- per pt 8 months agoGeneralized tonic-clonic.Last seizure couple of months ago- last sz 9-10 months ago per pt 10-12-2016  . Shortness of breath dyspnea   . Substance abuse ([place])   . Ulcer     Allergies Allergies  Allergen Reactions  . Fruit &  Vegetable Daily [Nutritional Supplements] Shortness Of Breath    Aloe  . Iodinated Diagnostic Agents Swelling    Swelling and itching of left side of her face only after CT SI injection(no steroid used)  . Aloe Vera Rash    IV Location/Drains/Wounds Patient Lines/Drains/Airways Status   Active Line/Drains/Airways    Name:   Placement date:   Placement time:   Site:   Days:   Peripheral IV 07/09/18 Left Forearm   07/09/18    1509    Forearm   less than 1   Peripheral IV 07/09/18 Right Forearm   07/09/18    1800    Forearm   less than 1          Labs/Imaging Results for orders placed or performed during the hospital encounter of 07/09/18 (from the past 48 hour(s))  CBG monitoring, ED     Status: Abnormal   Collection Time: 07/09/18  3:09 PM  Result Value Ref Range   Glucose-Capillary 112 (H) 70 - 99 mg/dL  Rapid urine drug screen (hospital performed)     Status: Abnormal   Collection Time: 07/09/18  3:11 PM  Result Value Ref Range   Opiates NONE DETECTED NONE DETECTED   Cocaine POSITIVE (A) NONE DETECTED   Benzodiazepines NONE DETECTED NONE DETECTED   Amphetamines NONE DETECTED NONE DETECTED  Tetrahydrocannabinol NONE DETECTED NONE DETECTED   Barbiturates NONE DETECTED NONE DETECTED    Comment: (NOTE) DRUG SCREEN FOR MEDICAL PURPOSES ONLY.  IF CONFIRMATION IS NEEDED FOR ANY PURPOSE, NOTIFY LAB WITHIN 5 DAYS. LOWEST DETECTABLE LIMITS FOR URINE DRUG SCREEN Drug Class                     Cutoff (ng/mL) Amphetamine and metabolites    1000 Barbiturate and metabolites    200 Benzodiazepine                 160 Tricyclics and metabolites     300 Opiates and metabolites        300 Cocaine and metabolites        300 THC                            50 Performed at [facility], [place] [address], [place], [place] [address]   Pregnancy, urine     Status: None   Collection Time: 07/09/18  3:11 PM  Result Value Ref Range   Preg Test, Ur NEGATIVE NEGATIVE     Comment:        THE SENSITIVITY OF THIS METHODOLOGY IS >20 mIU/mL. Performed at [facility], [place] [address], [place], [place] [address]   CBC     Status: Abnormal   Collection Time: 07/09/18  3:16 PM  Result Value Ref Range   WBC 4.8 4.0 - 10.5 K/uL   RBC 3.40 (L) 3.87 - 5.11 MIL/uL   Hemoglobin 9.8 (L) 12.0 - 15.0 g/dL   HCT 30.3 (L) 36.0 - 46.0 %   MCV 89.1 78.0 - 100.0 fL   MCH 28.8 26.0 - 34.0 pg   MCHC 32.3 30.0 - 36.0 g/dL   RDW 21.9 (H) 11.5 - 15.5 %   Platelets 245 150 - 400 K/uL    Comment: Performed at [facility], [place] [address], [place], [place] [address]  Comprehensive metabolic panel     Status: Abnormal   Collection Time: 07/09/18  3:16 PM  Result Value Ref Range   Sodium 140 135 - 145 mmol/L   Potassium 3.3 (L) 3.5 - 5.1 mmol/L   Chloride 105 98 - 111 mmol/L   CO2 25 22 - 32 mmol/L   Glucose, Bld 105 (H) 70 - 99 mg/dL   BUN 10 6 - 20 mg/dL   Creatinine, Ser 1.39 (H) 0.44 - 1.00 mg/dL   Calcium 9.9 8.9 - 10.3 mg/dL   Total Protein 8.7 (H) 6.5 - 8.1 g/dL   Albumin 4.4 3.5 - 5.0 g/dL   AST 73 (H) 15 - 41 U/L   ALT 39 0 - 44 U/L   Alkaline Phosphatase 74 38 - 126 U/L   Total Bilirubin 0.7 0.3 - 1.2 mg/dL   GFR calc non Af Amer 41 (L) >60 mL/min   GFR calc Af Amer 47 (L) >60 mL/min    Comment: (NOTE) The eGFR has been calculated using the CKD EPI equation. This calculation has not been validated in all clinical situations. eGFR's persistently <60 mL/min signify possible Chronic Kidney Disease.    Anion gap 10 5 - 15    Comment: Performed at [facility], [place] [address] [place], [place] [address]  Ethanol     Status: None   Collection Time: 07/09/18  3:16 PM  Result Value Ref Range   Alcohol, Ethyl (  B) <10 <10 mg/dL    Comment: (NOTE) Lowest detectable limit for serum alcohol is 10 mg/dL. For medical purposes only. Performed at [facility], [place] [address] [place],  [place] [address]   Salicylate level     Status: None   Collection Time: 07/09/18  3:16 PM  Result Value Ref Range   Salicylate Lvl <0.8 2.8 - 30.0 mg/dL    Comment: Performed at [facility] [place] [address], [place], [place] [address]  Acetaminophen level     Status: Abnormal   Collection Time: 07/09/18  3:16 PM  Result Value Ref Range   Acetaminophen (Tylenol), Serum 131 (H) 10 - 30 ug/mL    Comment: (NOTE) Therapeutic concentrations vary significantly. A range of 10-30 ug/mL  may be an effective concentration for many patients. However, some  are best treated at concentrations outside of this range. Acetaminophen concentrations >150 ug/mL at 4 hours after ingestion  and >50 ug/mL at 12 hours after ingestion are often associated with  toxic reactions. Performed at [facility], [place] [address], [place], [place] [address]   Protime-INR     Status: None   Collection Time: 07/09/18  3:16 PM  Result Value Ref Range   Prothrombin Time 12.8 11.4 - 15.2 seconds   INR 0.97     Comment: Performed at [facility], [place] [address] [place], [place] [address]  Lipase, blood     Status: None   Collection Time: 07/09/18  3:16 PM  Result Value Ref Range   Lipase 40 11 - 51 U/L    Comment: Performed at [facility], [place] [address], [place], [place] [address]  Magnesium     Status: Abnormal   Collection Time: 07/09/18  4:54 PM  Result Value Ref Range   Magnesium 1.5 (L) 1.7 - 2.4 mg/dL    Comment: Performed at [facility], [place] [address] [place], [place] [address]   [place] Ekg Site Rite  Result Date: 07/09/2018 If [facility] image not attached, placement could not be confirmed due to current cardiac rhythm.   Pending Labs Unresulted Labs (From admission, onward)   Start     Ordered   07/10/18 1500  Acetaminophen level  Once-Timed,   R     07/09/18 1702   07/10/18 1500  Hepatic function panel  Once-Timed,    R     07/09/18 1702   07/10/18 1500  Protime-INR  Once-Timed,   R     07/09/18 1702   07/09/18 1738  Urinalysis, Routine w reflex microscopic  Once,   R     07/09/18 1737   07/09/18 1738  Culture, Urine  Once,   R     07/09/18 1737   Signed and Held  Magnesium  Tomorrow morning,   R     Signed and Held   Signed and Held  Phosphorus  Add-on,   R     Signed and Held   Signed and Held  Urine culture  Once,   R     Signed and Held   Signed and Held  Urinalysis, Complete w Microscopic  Once,   R     Signed and Held   Visual merchandiser and Occupational hygienist morning,   R     Signed and Held   Visual merchandiser and Held  CBC  Tomorrow morning,   R     Signed and Held  Signed and Held  Occult blood card to lab, stool RN will collect  Once,   R    Question:  Specimen to be collected by?  Answer:  RN will collect   Signed and Held   Signed and Held  Vitamin B12  (Anemia Panel (PNL))  Once,   R     Signed and Held   Signed and Held  Folate  (Anemia Panel (PNL))  Once,   R     Signed and Held   Signed and Held  Iron and TIBC  (Anemia Panel (PNL))  Once,   R     Signed and Held   Signed and Held  Ferritin  (Anemia Panel (PNL))  Once,   R     Signed and Held   Signed and Held  Reticulocytes  (Anemia Panel (PNL))  Once,   R     Signed and Held      Vitals/Pain Today's Vitals   07/09/18 1530 07/09/18 1630 07/09/18 1700 07/09/18 1800  BP: (!) 163/70 (!) 155/69 (!) 135/54 (!) 176/85  Pulse: 82 89 75 78  Resp: 14 (!) 25 (!) 23 (!) 23  Temp:      SpO2: 100% 94% 100% 100%  Weight:      Height:      PainSc:        Isolation Precautions No active isolations  Medications Medications  0.9 %  sodium chloride infusion ( Intravenous New Bag/Given 07/09/18 1541)  acetylcysteine (ACETADOTE) 40 mg/mL load via infusion 7,140 mg (7,140 mg Intravenous Bolus from Bag 07/09/18 1730)  acetylcysteine (ACETADOTE) 40,000 mg in dextrose 5 % 1,000 mL (40 mg/mL) infusion (has no administration in time  range)  sodium chloride 0.9 % bolus 1,000 mL (1,000 mLs Intravenous New Bag/Given 07/09/18 1731)  0.9 %  sodium chloride infusion (has no administration in time range)  potassium chloride SA (K-DUR,KLOR-CON) CR tablet 40 mEq (has no administration in time range)  LORazepam (ATIVAN) tablet 1 mg (has no administration in time range)    Or  LORazepam (ATIVAN) injection 1 mg (has no administration in time range)  thiamine (VITAMIN B-1) tablet 100 mg (has no administration in time range)    Or  thiamine (B-1) injection 100 mg (has no administration in time range)  folic acid (FOLVITE) tablet 1 mg (has no administration in time range)  multivitamin with minerals tablet 1 tablet (has no administration in time range)  LORazepam (ATIVAN) injection 0-4 mg (has no administration in time range)    Followed by  LORazepam (ATIVAN) injection 0-4 mg (has no administration in time range)    Mobility walks

## 2018-07-10 DIAGNOSIS — Z818 Family history of other mental and behavioral disorders: Secondary | ICD-10-CM

## 2018-07-10 DIAGNOSIS — I10 Essential (primary) hypertension: Secondary | ICD-10-CM

## 2018-07-10 DIAGNOSIS — Z62811 Personal history of psychological abuse in childhood: Secondary | ICD-10-CM

## 2018-07-10 DIAGNOSIS — R1084 Generalized abdominal pain: Secondary | ICD-10-CM

## 2018-07-10 DIAGNOSIS — D509 Iron deficiency anemia, unspecified: Secondary | ICD-10-CM

## 2018-07-10 DIAGNOSIS — F17210 Nicotine dependence, cigarettes, uncomplicated: Secondary | ICD-10-CM

## 2018-07-10 DIAGNOSIS — K921 Melena: Secondary | ICD-10-CM

## 2018-07-10 DIAGNOSIS — G4700 Insomnia, unspecified: Secondary | ICD-10-CM

## 2018-07-10 DIAGNOSIS — F1290 Cannabis use, unspecified, uncomplicated: Secondary | ICD-10-CM

## 2018-07-10 DIAGNOSIS — Z62810 Personal history of physical and sexual abuse in childhood: Secondary | ICD-10-CM

## 2018-07-10 DIAGNOSIS — T50902D Poisoning by unspecified drugs, medicaments and biological substances, intentional self-harm, subsequent encounter: Secondary | ICD-10-CM

## 2018-07-10 DIAGNOSIS — M5442 Lumbago with sciatica, left side: Secondary | ICD-10-CM

## 2018-07-10 DIAGNOSIS — G8929 Other chronic pain: Secondary | ICD-10-CM

## 2018-07-10 DIAGNOSIS — F1490 Cocaine use, unspecified, uncomplicated: Secondary | ICD-10-CM

## 2018-07-10 DIAGNOSIS — T1491XA Suicide attempt, initial encounter: Secondary | ICD-10-CM

## 2018-07-10 DIAGNOSIS — T391X2D Poisoning by 4-Aminophenol derivatives, intentional self-harm, subsequent encounter: Secondary | ICD-10-CM

## 2018-07-10 DIAGNOSIS — T50901A Poisoning by unspecified drugs, medicaments and biological substances, accidental (unintentional), initial encounter: Secondary | ICD-10-CM

## 2018-07-10 LAB — CBC
HCT: 24.9 % — ABNORMAL LOW (ref 36.0–46.0)
Hemoglobin: 8 g/dL — ABNORMAL LOW (ref 12.0–15.0)
MCH: 29.1 pg (ref 26.0–34.0)
MCHC: 32.1 g/dL (ref 30.0–36.0)
MCV: 90.5 fL (ref 78.0–100.0)
PLATELETS: 175 10*3/uL (ref 150–400)
RBC: 2.75 MIL/uL — AB (ref 3.87–5.11)
RDW: 22.2 % — ABNORMAL HIGH (ref 11.5–15.5)
WBC: 3.6 10*3/uL — ABNORMAL LOW (ref 4.0–10.5)

## 2018-07-10 LAB — HEPATIC FUNCTION PANEL
ALBUMIN: 3.6 g/dL (ref 3.5–5.0)
ALT: 39 U/L (ref 0–44)
AST: 73 U/L — ABNORMAL HIGH (ref 15–41)
Alkaline Phosphatase: 92 U/L (ref 38–126)
Bilirubin, Direct: 0.1 mg/dL (ref 0.0–0.2)
Indirect Bilirubin: 0.5 mg/dL (ref 0.3–0.9)
Total Bilirubin: 0.6 mg/dL (ref 0.3–1.2)
Total Protein: 7.7 g/dL (ref 6.5–8.1)

## 2018-07-10 LAB — BASIC METABOLIC PANEL
ANION GAP: 8 (ref 5–15)
BUN: <5 mg/dL — ABNORMAL LOW (ref 6–20)
CALCIUM: 8.4 mg/dL — AB (ref 8.9–10.3)
CO2: 20 mmol/L — AB (ref 22–32)
CREATININE: 0.71 mg/dL (ref 0.44–1.00)
Chloride: 110 mmol/L (ref 98–111)
GLUCOSE: 120 mg/dL — AB (ref 70–99)
Potassium: 3 mmol/L — ABNORMAL LOW (ref 3.5–5.1)
Sodium: 138 mmol/L (ref 135–145)

## 2018-07-10 LAB — GLUCOSE, CAPILLARY: Glucose-Capillary: 233 mg/dL — ABNORMAL HIGH (ref 70–99)

## 2018-07-10 LAB — URINALYSIS, ROUTINE W REFLEX MICROSCOPIC
BACTERIA UA: NONE SEEN
Bilirubin Urine: NEGATIVE
Glucose, UA: NEGATIVE mg/dL
Ketones, ur: 5 mg/dL — AB
LEUKOCYTES UA: NEGATIVE
Nitrite: NEGATIVE
PROTEIN: NEGATIVE mg/dL
SPECIFIC GRAVITY, URINE: 1.015 (ref 1.005–1.030)
pH: 5 (ref 5.0–8.0)

## 2018-07-10 LAB — ACETAMINOPHEN LEVEL: Acetaminophen (Tylenol), Serum: <10 ug/mL — ABNORMAL LOW (ref 10–30)

## 2018-07-10 LAB — MAGNESIUM: MAGNESIUM: 2.1 mg/dL (ref 1.7–2.4)

## 2018-07-10 LAB — HEMOGLOBIN AND HEMATOCRIT, BLOOD
HEMATOCRIT: 27.4 % — AB (ref 36.0–46.0)
Hemoglobin: 8.7 g/dL — ABNORMAL LOW (ref 12.0–15.0)

## 2018-07-10 LAB — PROTIME-INR
INR: 1.02
Prothrombin Time: 13.3 seconds (ref 11.4–15.2)

## 2018-07-10 LAB — OCCULT BLOOD X 1 CARD TO LAB, STOOL: Fecal Occult Bld: NEGATIVE

## 2018-07-10 MED ORDER — SODIUM CHLORIDE 0.9 % IV SOLN
510.0000 mg | Freq: Once | INTRAVENOUS | Status: AC
  Administered 2018-07-10: 510 mg via INTRAVENOUS
  Filled 2018-07-10: qty 17

## 2018-07-10 MED ORDER — POTASSIUM CHLORIDE CRYS ER 20 MEQ PO TBCR
40.0000 meq | EXTENDED_RELEASE_TABLET | ORAL | Status: AC
  Administered 2018-07-10 (×2): 40 meq via ORAL
  Filled 2018-07-10 (×2): qty 2

## 2018-07-10 MED ORDER — PREDNISONE 10 MG (21) PO TBPK
20.0000 mg | ORAL_TABLET | Freq: Every evening | ORAL | Status: AC
  Administered 2018-07-10: 20 mg via ORAL

## 2018-07-10 MED ORDER — QUETIAPINE FUMARATE 25 MG PO TABS
150.0000 mg | ORAL_TABLET | Freq: Every day | ORAL | Status: DC
  Administered 2018-07-10 – 2018-07-14 (×5): 150 mg via ORAL
  Filled 2018-07-10 (×5): qty 2

## 2018-07-10 MED ORDER — DICLOFENAC SODIUM 1 % TD GEL
2.0000 g | Freq: Four times a day (QID) | TRANSDERMAL | Status: DC
  Administered 2018-07-10 – 2018-07-15 (×20): 2 g via TOPICAL
  Filled 2018-07-10: qty 100

## 2018-07-10 MED ORDER — PREDNISONE 10 MG (21) PO TBPK
10.0000 mg | ORAL_TABLET | ORAL | Status: AC
  Administered 2018-07-10: 10 mg via ORAL

## 2018-07-10 MED ORDER — PREDNISONE 10 MG (21) PO TBPK
10.0000 mg | ORAL_TABLET | Freq: Four times a day (QID) | ORAL | Status: DC
  Administered 2018-07-12 – 2018-07-14 (×9): 10 mg via ORAL
  Filled 2018-07-10: qty 21

## 2018-07-10 MED ORDER — PREDNISONE 10 MG (21) PO TBPK
20.0000 mg | ORAL_TABLET | Freq: Every morning | ORAL | Status: AC
  Administered 2018-07-10: 20 mg via ORAL
  Filled 2018-07-10: qty 21

## 2018-07-10 MED ORDER — PREDNISONE 10 MG (21) PO TBPK
20.0000 mg | ORAL_TABLET | Freq: Every evening | ORAL | Status: AC
  Administered 2018-07-11: 20 mg via ORAL

## 2018-07-10 MED ORDER — PREDNISONE 10 MG (21) PO TBPK
10.0000 mg | ORAL_TABLET | Freq: Three times a day (TID) | ORAL | Status: AC
  Administered 2018-07-11 (×3): 10 mg via ORAL

## 2018-07-10 NOTE — Progress Notes (Signed)
PROGRESS NOTE    Melissa Bridges  QHU:[id number] DOB: 11/30/1961 DOA: 07/09/2018 PCP: Forrest Moron, MD    Brief Narrative: Patient is a pleasant 57 year old lady history of polysubstance abuse including crack cocaine, COPD, history of seizures, hep C, alcohol use, schizoaffective disorder, chronic lumbar disc herniation presented to the ED with a Tylenol overdose.  Patient also noted to be anemic with a severe iron deficiency anemia.  Patient pancultured.  Patient placed on Mucomyst and poison control contacted.   Assessment & Plan:   Principal Problem:   Tylenol overdose Active Problems:   Suicidal ideation   Smoker   Schizoaffective disorder (HCC)   Chronic hepatitis C without hepatic coma (HCC)   Hypertension   Lower back pain   Seizure disorder (HCC)   Lumbar disc herniation   Hypokalemia   Abdominal pain   COPD (chronic obstructive pulmonary disease) (HCC)   Cocaine abuse (HCC)   Hypomagnesemia   Chronic left hip pain   Anemia  #1 Tylenol overdose/suicidal ideation Patient presented with a Tylenol overdose stated that she had significant back pain which was not as relieved with her crack cocaine and also had suicidal ideation as her grand daughter had told her she could not attend her graduation due to her history of polysubstance use.  Patient was placed on Mucomyst.  Repeat Tylenol level less than 10.  Tylenol level on admission was 131.  LFTs with no significant change.  INR at 1.07.  Discontinue Mucomyst per recommendations from poison control.  Supportive care.  Psychiatric consultation pending.  2.  Severe iron deficiency anemia Patient noted to be anemic on admission with a hemoglobin of 9.8.  Hemoglobin at 8.0 today.  Patient on admission with complaints of melanotic stools.  Anemia panel consistent with a severe iron deficiency anemia with iron level of 12, ferritin of 10.  Hemoglobin trending down.  FOBT negative.  Patient noted to have a colonoscopy 06/03/2016  with a large polyp that was removed via piecemeal as well as nonbleeding internal hemorrhoids.  Case discussed with ID and patient for probable upper endoscopy plus or minus colonoscopy joint his hospitalization which hopefully can be done tomorrow 07/11/2018.  We will give a dose of IV Feraheme.  Transfusion threshold hemoglobin less than 7.  GI consultation pending.  3.  Hypokalemia/hypomagnesemia Magnesium level at 2.1.  Replete.  4.  Dysuria Urinalysis unremarkable.  No need for antibiotics at this time.  Urine cultures pending.  Follow.  5.  Abdominal pain Patient with diffuse upper abdominal pain which she states is chronic in nature.  Patient states she is supposed to follow-up with a gastroenterologist in the outpatient setting.  Lipase levels within normal limits.  Tolerating current diet.  Continue PPI.  Follow.   6.  Polysubstance use/cocaine use Patient states she was clean for 5 years however relapsed recently and has used crack cocaine 3 times last use was the night prior to admission.  Cessation of polysubstance use stressed to patient.  Social work consult.  7.  Alcohol use Patient does endorse alcohol use.  Patient states she drinks as much as she can get her hands on and states he drinks both beer and liquor.  Patient placed on Ativan withdrawal protocol.  Continue thiamine, multivitamin, folic acid.  Supportive care.    8.  Schizoaffective disorder Psychiatric consultation pending for medication management.  9.  Chronic low back pain Patient states she is supposed to follow-up with her neurosurgeon.  Patient with Tylenol overdose and  as such cannot placed on Tylenol as needed.  Patient with a history of peptic ulcer disease and now complaining of melanotic stools and as such can't placed on any NSAIDs.  Lidoderm patch was ordered however patient unable to tolerate and as such we will discontinue Lidoderm patch.  Ultram discontinued as it lowers seizure threshold.  Continue  Neurontin.  Voltaren gel as needed.   10.  COPD/tobacco abuse. Stable.  Tobacco cessation.  Continue nicotine patch, Dulera and Spiriva.  Duo nebs as needed.  11.  History of seizure disorder Patient not on any anticonvulsant medications.    Discontinue Ultram.  Monitor for now.     #12 hypertension Continue home regimen Norvasc.     DVT prophylaxis: SCDs Code Status: Full Family Communication: Updated patient.  No family at bedside. Disposition Plan: To be determined.  Likely needs inpatient psychiatry.   Consultants:   Gastroenterology pending  Psychiatry consultation pending.  Procedures:  Acute abdominal series 07/09/2018  Antimicrobials:   None   Subjective: Patient sitting up at bedside eating lunch.  Denies any chest pain no shortness of breath.  Some improvement with lower back pain.  States stools are brown today.  Objective: Vitals:   07/09/18 2333 07/10/18 0530 07/10/18 0639 07/10/18 0837  BP: (!) 159/93 138/69    Pulse: 84 80    Resp: 20 16    Temp: 98.7 F (37.1 C) 98.5 F (36.9 C)    TempSrc:      SpO2: 100% 100%  99%  Weight:   48.6 kg (107 lb 2.3 oz)   Height:        Intake/Output Summary (Last 24 hours) at 07/10/2018 1205 Last data filed at 07/10/2018 0643 Gross per 24 hour  Intake 1440 ml  Output -  Net 1440 ml   Filed Weights   07/09/18 1506 07/10/18 0639  Weight: 47.6 kg (105 lb) 48.6 kg (107 lb 2.3 oz)    Examination:  General exam: Appears calm and comfortable  Respiratory system: Clear to auscultation. Respiratory effort normal. Cardiovascular system: S1 & S2 heard, RRR. No JVD, murmurs, rubs, gallops or clicks. No pedal edema. Gastrointestinal system: Abdomen is nondistended, soft and minimal to mild tenderness to palpation in the epigastrium.  No organomegaly or masses felt. Normal bowel sounds heard. Central nervous system: Alert and oriented. No focal neurological deficits. Extremities: Symmetric 5 x 5 power. Skin: No  rashes, lesions or ulcers Psychiatry: Judgement and insight appear normal. Mood & affect appropriate.     Data Reviewed: I have personally reviewed following labs and imaging studies  CBC: Recent Labs  Lab 07/05/18 1610 07/09/18 1516 07/10/18 0512  WBC 8.9 4.8 3.6*  NEUTROABS 5.4  --   --   HGB 9.9* 9.8* 8.0*  HCT 32.0* 30.3* 24.9*  MCV 92 89.1 90.5  PLT 311 245 027   Basic Metabolic Panel: Recent Labs  Lab 07/05/18 1610 07/09/18 1516 07/09/18 1654 07/09/18 1845 07/10/18 0512  NA 138 140  --   --  138  K 3.9 3.3*  --   --  3.0*  CL 98 105  --   --  110  CO2 23 25  --   --  20*  GLUCOSE 106* 105*  --   --  120*  BUN 11 10  --   --  <5*  CREATININE 1.09* 1.39*  --   --  0.71  CALCIUM 10.5* 9.9  --   --  8.4*  MG  --   --  1.5*  --  2.1  PHOS  --   --   --  2.9  --    GFR: Estimated Creatinine Clearance: 58.1 mL/min (by C-G formula based on SCr of 0.71 mg/dL). Liver Function Tests: Recent Labs  Lab 07/05/18 1610 07/09/18 1516  AST 76* 73*  ALT 42* 39  ALKPHOS 96 74  BILITOT 0.4 0.7  PROT 8.7* 8.7*  ALBUMIN 4.8 4.4   Recent Labs  Lab 07/09/18 1516  LIPASE 40   No results for input(s): AMMONIA in the last 168 hours. Coagulation Profile: Recent Labs  Lab 07/09/18 1516  INR 0.97   Cardiac Enzymes: No results for input(s): CKTOTAL, CKMB, CKMBINDEX, TROPONINI in the last 168 hours. BNP (last 3 results) No results for input(s): PROBNP in the last 8760 hours. HbA1C: No results for input(s): HGBA1C in the last 72 hours. CBG: Recent Labs  Lab 07/09/18 1509 07/10/18 0734  GLUCAP 112* 233*   Lipid Profile: No results for input(s): CHOL, HDL, LDLCALC, TRIG, CHOLHDL, LDLDIRECT in the last 72 hours. Thyroid Function Tests: No results for input(s): TSH, T4TOTAL, FREET4, T3FREE, THYROIDAB in the last 72 hours. Anemia Panel: Recent Labs    07/09/18 1845  VITAMINB12 602  FOLATE 16.2  FERRITIN 10*  TIBC 593*  IRON 12*  RETICCTPCT 1.1   Sepsis  Labs: No results for input(s): PROCALCITON, LATICACIDVEN in the last 168 hours.  Recent Results (from the past 240 hour(s))  Urine Culture     Status: None   Collection Time: 07/05/18  4:14 PM  Result Value Ref Range Status   Urine Culture, Routine Final report  Final   Organism ID, Bacteria Comment  Final    Comment: Mixed urogenital flora Less than 10,000 colonies/mL          Radiology Studies: Acute Abdominal Series  Result Date: 07/09/2018 CLINICAL DATA:  Overdose EXAM: DG ABDOMEN ACUTE W/ 1V CHEST COMPARISON:  CT 05/23/2018 FINDINGS: There is no evidence of dilated bowel loops or free intraperitoneal air. No radiopaque calculi or other significant radiographic abnormality is seen. Heart size and mediastinal contours are within normal limits. Both lungs are clear. IMPRESSION: Negative abdominal radiographs.  No acute cardiopulmonary disease. Electronically Signed   By: Rolm Baptise M.D.   On: 07/09/2018 20:52   [place] Ekg Site Rite  Result Date: 07/09/2018 If Site Rite image not attached, placement could not be confirmed due to current cardiac rhythm.       Scheduled Meds: . amLODipine  5 mg Oral Daily  . citalopram  10 mg Oral Daily  . ferrous sulfate  325 mg Oral BID WC  . folic acid  1 mg Oral Daily  . gabapentin  400 mg Oral TID  . LORazepam  0-4 mg Intravenous Q6H   Followed by  . [START ON 07/11/2018] LORazepam  0-4 mg Intravenous Q12H  . mometasone-formoterol  2 puff Inhalation BID  . multivitamin with minerals  1 tablet Oral Daily  . nicotine  14 mg Transdermal Q24H  . pantoprazole  40 mg Oral BID AC  . potassium chloride  40 mEq Oral Q4H  . predniSONE  10 mg Oral PC lunch  . predniSONE  10 mg Oral PC supper  . [START ON 07/11/2018] predniSONE  10 mg Oral 3 x daily with food  . [START ON 07/12/2018] predniSONE  10 mg Oral 4X daily taper  . predniSONE  20 mg Oral Nightly  . [START ON 07/11/2018] predniSONE  20 mg Oral Nightly  .  senna-docusate  1 tablet Oral BID   . thiamine  100 mg Oral Daily   Or  . thiamine  100 mg Intravenous Daily   Continuous Infusions: . sodium chloride 20 mL/hr at 07/09/18 1541  . acetylcysteine (ACETADOTE) infusion 40 g/1000 ML       LOS: 1 day    Time spent: 40 mins    Irine Seal, MD [facility] Pager [phone]  If 7PM-7AM, please contact night-coverage [email or web address] Password [facility] 07/10/2018, 12:05 PM

## 2018-07-10 NOTE — Consult Note (Addendum)
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 stabilization/insomnia since patient reports side effect of hypersomia. -Please pursue involuntary commitment if patient refuses voluntary psychiatric hospitalization or attempts to leave the hospital.  -Will sign off on patient at this time. Please consult psychiatry again as needed.     Disposition: Recommend psychiatric Inpatient admission when medically cleared.  Faythe Dingwall, DO 07/10/2018 12:09 PM

## 2018-07-10 NOTE — Evaluation (Signed)
Physical Therapy Evaluation Patient Details Name: Melissa Bridges MRN: [id number] DOB: Nov 26, 1961 Today's Date: 07/10/2018   History of Present Illness   Melissa Bridges is a 57 y.o. female with medical history significant of polysubstance abuse including crack cocaine, COPD, history of seizures, hepatitis C, schizoaffective disorder, lumbar disc herniation, chronic left hip-midback-left shoulder pain presented to the ED with Tylenol overdose with intent to kill self  Clinical Impression   Patient received in bed asleep, easily woken and willing to participate in PT today. She completes functional bed mobility with S, however does require Min guard for functional transfers and gait with RW today. She displays poor safety awareness in general with general gait pattern as described below, and requires cues for safety and pacing with RW today. She will benefit from ongoing skilled PT services in the acute setting as well as skilled PT intervention in the ST-SNF setting moving forward. She was left in bed with all needs met, CNA sitter present this morning.     Follow Up Recommendations SNF    Equipment Recommendations  Rolling walker with 5" wheels;Other (comment)(defer to next venue )    Recommendations for Other Services       Precautions / Restrictions Precautions Precautions: Fall Restrictions Weight Bearing Restrictions: No      Mobility  Bed Mobility Overal bed mobility: Needs Assistance Bed Mobility: Supine to Sit;Sit to Supine     Supine to sit: Supervision Sit to supine: Supervision      Transfers Overall transfer level: Needs assistance Equipment used: Rolling walker (2 wheeled) Transfers: Sit to/from Stand Sit to Stand: Min guard         General transfer comment: VC for safety and sequencing   Ambulation/Gait Ambulation/Gait assistance: Min guard Gait Distance (Feet): 100 Feet Assistive device: Rolling walker (2 wheeled) Gait Pattern/deviations: Step-to  pattern;Decreased step length - right;Decreased stance time - left;Decreased stride length;Decreased dorsiflexion - left;Decreased weight shift to left;Antalgic;Drifts right/left;Narrow base of support;Trunk flexed     General Gait Details: sometimes puts L toes down on floor, other times is self imposed NWB, reports she does this due to pain L LE; jerking pattern with RW, cues for pacing and safety provided   Stairs            Wheelchair Mobility    Modified Rankin (Stroke Patients Only)       Balance Overall balance assessment: Needs assistance Sitting-balance support: No upper extremity supported;Feet supported Sitting balance-Leahy Scale: Good     Standing balance support: During functional activity;Bilateral upper extremity supported Standing balance-Leahy Scale: Poor                               Pertinent Vitals/Pain Pain Assessment: 0-10 Pain Score: 8  Faces Pain Scale: Hurts little more Pain Location: left hip/leg, mid back, left shoulder Pain Descriptors / Indicators: Aching;Sore Pain Intervention(s): Limited activity within patient's tolerance;Monitored during session;Repositioned    Home Living Family/patient expects to be discharged to:: Private residence Living Arrangements: Alone   Type of Home: Apartment Home Access: Level entry     Home Layout: One level Home Equipment: Grab bars - tub/shower      Prior Function Level of Independence: Independent               Hand Dominance   Dominant Hand: Right    Extremity/Trunk Assessment   Upper Extremity Assessment Upper Extremity Assessment: Defer to OT evaluation LUE Deficits / Details:  Old fx (looks like old broken clavicle), decreased AROM shoulder and weak grip. Pt reports she does not use this arm much due to pain (put her socks on one handed) LUE Coordination: decreased fine motor;decreased gross motor    Lower Extremity Assessment Lower Extremity Assessment:  Generalized weakness;RLE deficits/detail;LLE deficits/detail RLE Deficits / Details: uses R LE primarily and reports that she has not put her L LE down due to pain for quite some time  LLE Deficits / Details: very hesitant to touch floor with this foot- sometimes puts toes down, sometimes leaves it NWB and does hop to pattern to RW  LLE: Unable to fully assess due to pain LLE Sensation: decreased proprioception LLE Coordination: decreased gross motor    Cervical / Trunk Assessment Cervical / Trunk Assessment: Kyphotic  Communication   Communication: No difficulties  Cognition Arousal/Alertness: Awake/alert Behavior During Therapy: Impulsive Overall Cognitive Status: No family/caregiver present to determine baseline cognitive functioning                                 General Comments: jerking pace with RW, VCs for navigation in room and pacing so that IV lines do not get pulled       General Comments      Exercises     Assessment/Plan    PT Assessment Patient needs continued PT services  PT Problem List Decreased strength;Decreased mobility;Decreased safety awareness;Decreased coordination;Decreased activity tolerance;Decreased balance;Decreased knowledge of use of DME;Pain       PT Treatment Interventions DME instruction;Therapeutic activities;Gait training;Therapeutic exercise;Patient/family education;Stair training;Balance training;Functional mobility training;Neuromuscular re-education;Manual techniques    PT Goals (Current goals can be found in the Care Plan section)  Acute Rehab PT Goals Patient Stated Goal: to get pain better PT Goal Formulation: With patient Time For Goal Achievement: 07/24/18 Potential to Achieve Goals: Good    Frequency Min 2X/week   Barriers to discharge        Co-evaluation               AM-PAC PT "6 Clicks" Daily Activity  Outcome Measure Difficulty turning over in bed (including adjusting bedclothes, sheets and  blankets)?: None Difficulty moving from lying on back to sitting on the side of the bed? : None Difficulty sitting down on and standing up from a chair with arms (e.g., wheelchair, bedside commode, etc,.)?: A Little Help needed moving to and from a bed to chair (including a wheelchair)?: A Little Help needed walking in hospital room?: A Little Help needed climbing 3-5 steps with a railing? : A Lot 6 Click Score: 19    End of Session Equipment Utilized During Treatment: Gait belt Activity Tolerance: Patient tolerated treatment well Patient left: in bed;with call bell/phone within reach;with nursing/sitter in room   PT Visit Diagnosis: Unsteadiness on feet (R26.81);Muscle weakness (generalized) (M62.81);Difficulty in walking, not elsewhere classified (R26.2)    Time: 0315-9458 PT Time Calculation (min) (ACUTE ONLY): 10 min   Charges:   PT Evaluation $PT Eval Low Complexity: 1 Low          Deniece Ree PT, DPT, CBIS  Supplemental Physical Therapist [place]   Pager [phone]

## 2018-07-10 NOTE — Progress Notes (Signed)
Levada Dy from poison control. Given orders to stop Acetylcysteine. No further recommendations at this time. Poison control signing off at this time.

## 2018-07-10 NOTE — H&P (View-Only) (Signed)
Consultation  Referring Provider:  Dr. Grandville Silos    Primary Care Physician:  Forrest Moron, MD Primary Gastroenterologist: Dr. Havery Moros        Reason for Consultation: Melena, IDA             HPI:   Melissa Bridges is a 57 y.o. female with past medical history of polysubstance abuse including crack cocaine, COPD, h/o seizures, hep C, schizoaffective disorder and others listed below who presented to the ER on 07/09/18 with Tylenol overuse in attempted suicide.  We are consulted today in regards to microcytic anemia and history of melena.    Today, patient explains that she took approximately 15 Tylenol tablets on the day prior to admission and another 60 tablets the morning of admission.  Per ED physician this was a suicide attempt as patient had just been told that her granddaughter did not want her at her graduation because she used cocaine.      At time of interview today patient describes that she has been having "coffee ground stools" off and on for "years", typically on days that she would drink alcohol, with "normal" stool inbetween.  Has had some reflux and heartburn issues for which she is using over-the-counter Tums and describes a chronic epigastric pain which is some worse since admission.    Does admit to occasional nosebleeds as well.    Denies fever, hematemesis, shortness of breath, dizziness or syncope.  ED Course: Patient seen in the ED, compressive metabolic profile obtained with a potassium of 3.3, creatinine of 1.39, glucose of 105, AST of 73, protein of 8.7 otherwise was within normal limits.  CBC had a hemoglobin of 9.8 otherwise was within normal limits.  Urine pregnancy test was negative.  Acetaminophen level was 131.  Salicylate level was less than 7.  INR of 0.97.  PT of 12.8.  UDS was positive for cocaine.  EKG with normal sinus rhythm, LVH.  Patient placed on Mucomyst and given IV fluids.  Previous GI History: 10/26/16 flex sig, Dr. Havery Moros: polypectomy scar,  repeat recommended in 3 yr for surveillance 06/03/16 colonoscopy, Dr. Havery Moros: one large polyp at the recto-sigmoid colon, removed piecemeal, non-bleeding internal hemorrhoids; Pathology: sessile serrated polyp; Repeat recommended for surveillance in 3 mos  Past Medical History:  Diagnosis Date  . Allergy   . Alopecia   . Anxiety   . Arthritis   . Asthma   . Cataract    "mild"  . COPD (chronic obstructive pulmonary disease) ([place])   . Depression   . GERD (gastroesophageal reflux disease)    o cc- takes OTC if needed.  . Glaucoma   . Hepatitis C   . Hypertension    onset age 31.  . Iron deficiency anemia   . Migraine   . Schizoaffective disorder ([place])   . Sciatic pain   . Seizures ([place])    onset in childhood. 06-03-16- per pt 8 months agoGeneralized tonic-clonic.Last seizure couple of months ago- last sz 9-10 months ago per pt 10-12-2016  . Shortness of breath dyspnea   . Substance abuse ([place])   . Ulcer     Past Surgical History:  Procedure Laterality Date  . ABDOMINAL HYSTERECTOMY     ovarian cyst B, cervical dysplasia, fibroids.   Ovaries intact.  . COLONOSCOPY    . COLONOSCOPY W/ BIOPSIES    . DILATION AND CURETTAGE OF UTERUS     x2  . EXPLORATORY LAPAROTOMY     x3  .  GYNECOLOGIC CRYOSURGERY     x3  . MANDIBLE RECONSTRUCTION N/A 06/27/2015   Procedure: REMOVAL MAXILLARY PALATAL TORUS.  REMOVAL MANDIBULAR TORUS AND EXOSTOSIS.  ;  Surgeon: Diona Browner, DDS;  Location: [facility];  Service: Oral Surgery;  Laterality: N/A;  . OVARIAN CYST REMOVAL  2008  . POLYPECTOMY      Family History  Problem Relation Age of Onset  . Diabetes type II Mother   . Hypertension Mother   . Arthritis Mother   . Diabetes Mother   . Mental illness Mother        bipolar  . Diabetes type II Maternal Aunt   . Hypertension Father   . Heart murmur Father   . Arthritis Father   . Stroke Father   . Alopecia Sister   . Alopecia Brother   . Alopecia Sister   . Mental retardation Sister         depression  . Prostate cancer Paternal Grandfather   . Cancer Maternal Uncle   . Colon cancer Neg Hx   . Esophageal cancer Neg Hx   . Rectal cancer Neg Hx   . Stomach cancer Neg Hx   . Colon polyps Neg Hx     Social History   Tobacco Use  . Smoking status: Current Every Day Smoker    Packs/day: 0.33    Years: 44.00    Pack years: 14.52    Types: Cigarettes  . Smokeless tobacco: Never Used  Substance Use Topics  . Alcohol use: Yes    Alcohol/week: 0.0 oz    Comment: occasionally  . Drug use: Yes    Types: Marijuana, Cocaine    Prior to Admission medications   Medication Sig Start Date End Date Taking? Authorizing Provider  acetaminophen (TYLENOL) 500 MG tablet Take 500 mg by mouth once.   Yes [provider]  albuterol (VENTOLIN HFA) 108 (90 Base) MCG/ACT inhaler INHALE TWO PUFFS BY MOUTH EVERY 6 HOURS AS NEEDED FOR SHORTNESS OF BREATH 05/26/18  Yes Jaynee Eagles, PA-C  amLODipine (NORVASC) 5 MG tablet Take 1 tablet (5 mg total) by mouth daily. 06/27/18  Yes Wardell Honour, MD  budesonide-formoterol [facility]) 80-4.5 MCG/ACT inhaler Inhale 2 puffs into the lungs 2 (two) times daily. 06/13/18  Yes Wardell Honour, MD  citalopram (CELEXA) 10 MG tablet Take 1 tablet (10 mg total) by mouth daily. For depression 05/26/18  Yes Jaynee Eagles, PA-C  diphenhydrAMINE-APAP, sleep, (TYLENOL PM EXTRA STRENGTH PO) Take 500 mg by mouth once.    Yes [provider]  ferrous sulfate 325 (65 FE) MG tablet Take 1 tablet (325 mg total) by mouth 2 (two) times daily with a meal. 07/05/18  Yes Wardell Honour, MD  gabapentin (NEURONTIN) 400 MG capsule Take 1 capsule (400 mg total) by mouth 3 (three) times daily. 06/13/18  Yes Wardell Honour, MD  methocarbamol (ROBAXIN) 500 MG tablet Take 2 tablets (1,000 mg total) by mouth every 8 (eight) hours as needed for muscle spasms. 05/24/18  Yes Debbe Odea, MD  QUEtiapine (SEROQUEL) 300 MG tablet Take 1 tablet (300 mg total) by mouth at bedtime.  Mood control 05/26/18  Yes Jaynee Eagles, PA-C  diclofenac sodium (VOLTAREN) 1 % GEL Apply 2 g topically 4 (four) times daily. Patient not taking: Reported on 07/09/2018 05/24/18   Debbe Odea, MD  ipratropium-albuterol (DUONEB) 0.5-2.5 (3) MG/3ML SOLN Take 3 mLs by nebulization every 4 (four) hours as needed (shortness of breath). Reported on 12/02/2015  [provider]  LamoTRIgine (LAMICTAL XR) 25 MG TB24 24 hour tablet take 1 tablet daily for 2 weeks, then increase to 2 tablets daily for 2 weeks 05/24/18   Debbe Odea, MD  Misc. Devices ([place]) MISC Sig: one rolling walker any name brand with ambulation. DDD lumbar spine 07/05/18   Wardell Honour, MD  triamcinolone cream (KENALOG) 0.1 % APPLY  CREAM EXTERNALLY TWICE DAILY Patient not taking: Reported on 07/09/2018 09/02/17   Wardell Honour, MD  valACYclovir (VALTREX) 1000 MG tablet Take 2 tabs po at symptom onset, repeat in 12 hours Patient taking differently: Take 2,000 mg by mouth daily as needed (flare ups).  07/29/17   Wardell Honour, MD    Current Facility-Administered Medications  Medication Dose Route Frequency Provider Last Rate Last Dose  . 0.9 %  sodium chloride infusion   Intravenous Continuous Eugenie Filler, MD 20 mL/hr at 07/09/18 1541    . acetylcysteine (ACETADOTE) 40,000 mg in dextrose 5 % 1,000 mL (40 mg/mL) infusion  15 mg/kg/hr Intravenous Continuous Eugenie Filler, MD      . amLODipine (NORVASC) tablet 5 mg  5 mg Oral Daily Eugenie Filler, MD   5 mg at 07/10/18 1096  . citalopram (CELEXA) tablet 10 mg  10 mg Oral Daily Eugenie Filler, MD   10 mg at 07/10/18 0454  . diclofenac sodium (VOLTAREN) 1 % transdermal gel 2 g  2 g Topical QID Eugenie Filler, MD   2 g at 07/10/18 1429  . ferrous sulfate tablet 325 mg  325 mg Oral BID WC Eugenie Filler, MD   325 mg at 07/10/18 0746  . folic acid (FOLVITE) tablet 1 mg  1 mg Oral Daily Eugenie Filler, MD   1 mg at 07/10/18 0902  .  gabapentin (NEURONTIN) capsule 400 mg  400 mg Oral TID Eugenie Filler, MD   400 mg at 07/10/18 0902  . ipratropium-albuterol (DUONEB) 0.5-2.5 (3) MG/3ML nebulizer solution 3 mL  3 mL Nebulization Q4H PRN Eugenie Filler, MD      . LORazepam (ATIVAN) injection 0-4 mg  0-4 mg Intravenous Q6H Eugenie Filler, MD   2 mg at 07/10/18 0981   Followed by  . [START ON 07/11/2018] LORazepam (ATIVAN) injection 0-4 mg  0-4 mg Intravenous Q12H Eugenie Filler, MD      . LORazepam (ATIVAN) tablet 1 mg  1 mg Oral Q6H PRN Eugenie Filler, MD   1 mg at 07/09/18 2034   Or  . LORazepam (ATIVAN) injection 1 mg  1 mg Intravenous Q6H PRN Eugenie Filler, MD      . methocarbamol (ROBAXIN) tablet 1,000 mg  1,000 mg Oral Q8H PRN Eugenie Filler, MD   1,000 mg at 07/10/18 0902  . mometasone-formoterol (DULERA) 100-5 MCG/ACT inhaler 2 puff  2 puff Inhalation BID Eugenie Filler, MD   2 puff at 07/10/18 [phone]  . multivitamin with minerals tablet 1 tablet  1 tablet Oral Daily Eugenie Filler, MD   1 tablet at 07/10/18 0902  . nicotine (NICODERM CQ - dosed in mg/24 hours) patch 14 mg  14 mg Transdermal Q24H Eugenie Filler, MD   14 mg at 07/09/18 2018  . ondansetron (ZOFRAN) tablet 4 mg  4 mg Oral Q6H PRN Eugenie Filler, MD       Or  . ondansetron [facility]) injection 4 mg  4 mg Intravenous Q6H PRN Irine Seal  V, MD      . pantoprazole (PROTONIX) EC tablet 40 mg  40 mg Oral BID AC Eugenie Filler, MD   40 mg at 07/10/18 0746  . predniSONE (STERAPRED UNI-PAK 21 TAB) tablet 10 mg  10 mg Oral PC supper Eugenie Filler, MD      . Derrill Memo ON 07/11/2018] predniSONE (STERAPRED UNI-PAK 21 TAB) tablet 10 mg  10 mg Oral 3 x daily with food Eugenie Filler, MD      . Derrill Memo ON 07/12/2018] predniSONE (STERAPRED UNI-PAK 21 TAB) tablet 10 mg  10 mg Oral 4X daily taper Eugenie Filler, MD      . predniSONE (STERAPRED UNI-PAK 21 TAB) tablet 20 mg  20 mg Oral Nightly Eugenie Filler, MD      .  Derrill Memo ON 07/11/2018] predniSONE (STERAPRED UNI-PAK 21 TAB) tablet 20 mg  20 mg Oral Nightly Eugenie Filler, MD      . senna-docusate (Senokot-S) tablet 1 tablet  1 tablet Oral BID Eugenie Filler, MD   1 tablet at 07/10/18 [phone]  . sodium phosphate (FLEET) 7-19 GM/118ML enema 1 enema  1 enema Rectal Once PRN Eugenie Filler, MD      . sorbitol 70 % solution 30 mL  30 mL Oral Daily PRN Eugenie Filler, MD      . thiamine (VITAMIN B-1) tablet 100 mg  100 mg Oral Daily Eugenie Filler, MD   100 mg at 07/10/18 7893   Or  . thiamine (B-1) injection 100 mg  100 mg Intravenous Daily Eugenie Filler, MD        Allergies as of 07/09/2018 - Review Complete 07/09/2018  Allergen Reaction Noted  . Fruit & vegetable daily [nutritional supplements] Shortness Of Breath 06/27/2015  . Iodinated diagnostic agents Swelling 07/13/2016  . Aloe vera Rash 07/09/2018     Review of Systems:    Constitutional: No fever or chills Skin: No rash  Cardiovascular: No chest pain Respiratory: No SOB  Gastrointestinal: See HPI and otherwise negative Genitourinary: No dysuria Neurological: No headache, dizziness or syncope Musculoskeletal: No new muscle or joint pain Hematologic: No bruising + nosebleeds Psychiatric: No history of depression or anxiety    Physical Exam:  Vital signs in last 24 hours: Temp:  [97.8 F (36.6 C)-98.7 F (37.1 C)] 97.8 F (36.6 C) (07/29 1208) Pulse Rate:  [75-95] 95 (07/29 1208) Resp:  [14-25] 16 (07/29 1208) BP: (119-176)/(54-93) 144/79 (07/29 1208) SpO2:  [94 %-100 %] 100 % (07/29 1208) Weight:  [107 lb 2.3 oz (48.6 kg)] 107 lb 2.3 oz (48.6 kg) (07/29 0639) Last BM Date: 07/09/18 General:   Pleasant AA female appears to be in NAD, Well developed, Well nourished, alert and cooperative Head:  Normocephalic and atraumatic. Eyes:   PEERL, EOMI. No icterus. Conjunctiva pink. Ears:  Normal auditory acuity. Neck:  Supple Throat: Oral cavity and pharynx without  inflammation, swelling or lesion. Teeth in good condition. Lungs: Respirations even and unlabored. Lungs clear to auscultation bilaterally.   No wheezes, crackles, or rhonchi.  Heart: Normal S1, S2. No MRG. Regular rate and rhythm. No peripheral edema, cyanosis or pallor.  Abdomen:  Soft, nondistended, moderate epigastric ttp. No rebound or guarding. Normal bowel sounds. No appreciable masses or hepatomegaly. Rectal:  Not performed.  Msk:  Symmetrical without gross deformities. Extremities:  Without edema, no deformity or joint abnormality.  Neurologic:  Alert and  oriented x4;  grossly normal neurologically.  Skin:   Dry  and intact without significant lesions or rashes. Psychiatric: Demonstrates good judgement and reason without abnormal affect or behaviors.   LAB RESULTS: Recent Labs    07/09/18 1516 07/10/18 0512 07/10/18 1446  WBC 4.8 3.6*  --   HGB 9.8* 8.0* 8.7*  HCT 30.3* 24.9* 27.4*  PLT 245 175  --    BMET Recent Labs    07/09/18 1516 07/10/18 0512  NA 140 138  K 3.3* 3.0*  CL 105 110  CO2 25 20*  GLUCOSE 105* 120*  BUN 10 <5*  CREATININE 1.39* 0.71  CALCIUM 9.9 8.4*   LFT Recent Labs    07/09/18 1516  PROT 8.7*  ALBUMIN 4.4  AST 73*  ALT 39  ALKPHOS 74  BILITOT 0.7   PT/INR Recent Labs    07/09/18 1516  LABPROT 12.8  INR 0.97    STUDIES: Acute Abdominal Series  Result Date: 07/09/2018 CLINICAL DATA:  Overdose EXAM: DG ABDOMEN ACUTE W/ 1V CHEST COMPARISON:  CT 05/23/2018 FINDINGS: There is no evidence of dilated bowel loops or free intraperitoneal air. No radiopaque calculi or other significant radiographic abnormality is seen. Heart size and mediastinal contours are within normal limits. Both lungs are clear. IMPRESSION: Negative abdominal radiographs.  No acute cardiopulmonary disease. Electronically Signed   By: Rolm Baptise M.D.   On: 07/09/2018 20:52   [place] Ekg Site Rite  Result Date: 07/09/2018 If Site Rite image not attached, placement  could not be confirmed due to current cardiac rhythm.    Impression / Plan:   Impression: 1. Microcytic anemia with melena: hgb 9.8-->8.0-->8.7, h/o melena over the past couple of years, related to days of etoh use; concern for PUD vs other source of upper GI bleed 2. Tylenol overdose: on Mucomyst, psych eval given suicidal ideation, LFT's with elevated AST at 73, others normal 3. Abdominal pain: diffuse upper abdominal pain, for years, some worse past couple days; consider relation to PUD vs erosive gastritis 4. Polysubstance abuse/cocaine use 5. Alcohol use 6. Hypokalemia: K 3.0- has been given 95mEq since last check, suspect this will normalize in the am 7. COPD  Plan: 1. Plan for EGD tomorrow with Dr. Havery Moros. Discussed risks, benefits, limitations and alternatives and the patient agrees to proceed. 2. Patient will be NPO after midnight 3. Agree with BID PPI 4. Will need to ensure that potassium has normalized on am labs, if not may require further supplementation prior to procedure 5. Please await any further recommendations from Dr. Havery Moros later today  Thank you for your kind consultation, we will continue to follow.  Lavone Nian Lemmon  07/10/2018, 3:10 PM

## 2018-07-10 NOTE — Consult Note (Signed)
Consultation  Referring Provider:  Dr. Grandville Silos    Primary Care Physician:  Forrest Moron, MD Primary Gastroenterologist: Dr. Havery Moros        Reason for Consultation: Melena, IDA             HPI:   Melissa Bridges is a 57 y.o. female with past medical history of polysubstance abuse including crack cocaine, COPD, h/o seizures, hep C, schizoaffective disorder and others listed below who presented to the ER on 07/09/18 with Tylenol overuse in attempted suicide.  We are consulted today in regards to microcytic anemia and history of melena.    Today, patient explains that she took approximately 15 Tylenol tablets on the day prior to admission and another 60 tablets the morning of admission.  Per ED physician this was a suicide attempt as patient had just been told that her granddaughter did not want her at her graduation because she used cocaine.      At time of interview today patient describes that she has been having "coffee ground stools" off and on for "years", typically on days that she would drink alcohol, with "normal" stool inbetween.  Has had some reflux and heartburn issues for which she is using over-the-counter Tums and describes a chronic epigastric pain which is some worse since admission.    Does admit to occasional nosebleeds as well.    Denies fever, hematemesis, shortness of breath, dizziness or syncope.  ED Course: Patient seen in the ED, compressive metabolic profile obtained with a potassium of 3.3, creatinine of 1.39, glucose of 105, AST of 73, protein of 8.7 otherwise was within normal limits.  CBC had a hemoglobin of 9.8 otherwise was within normal limits.  Urine pregnancy test was negative.  Acetaminophen level was 131.  Salicylate level was less than 7.  INR of 0.97.  PT of 12.8.  UDS was positive for cocaine.  EKG with normal sinus rhythm, LVH.  Patient placed on Mucomyst and given IV fluids.  Previous GI History: 10/26/16 flex sig, Dr. Havery Moros: polypectomy scar,  repeat recommended in 3 yr for surveillance 06/03/16 colonoscopy, Dr. Havery Moros: one large polyp at the recto-sigmoid colon, removed piecemeal, non-bleeding internal hemorrhoids; Pathology: sessile serrated polyp; Repeat recommended for surveillance in 3 mos  Past Medical History:  Diagnosis Date  . Allergy   . Alopecia   . Anxiety   . Arthritis   . Asthma   . Cataract    "mild"  . COPD (chronic obstructive pulmonary disease) ([place])   . Depression   . GERD (gastroesophageal reflux disease)    o cc- takes OTC if needed.  . Glaucoma   . Hepatitis C   . Hypertension    onset age 58.  . Iron deficiency anemia   . Migraine   . Schizoaffective disorder ([place])   . Sciatic pain   . Seizures ([place])    onset in childhood. 06-03-16- per pt 8 months agoGeneralized tonic-clonic.Last seizure couple of months ago- last sz 9-10 months ago per pt 10-12-2016  . Shortness of breath dyspnea   . Substance abuse ([place])   . Ulcer     Past Surgical History:  Procedure Laterality Date  . ABDOMINAL HYSTERECTOMY     ovarian cyst B, cervical dysplasia, fibroids.   Ovaries intact.  . COLONOSCOPY    . COLONOSCOPY W/ BIOPSIES    . DILATION AND CURETTAGE OF UTERUS     x2  . EXPLORATORY LAPAROTOMY     x3  .  GYNECOLOGIC CRYOSURGERY     x3  . MANDIBLE RECONSTRUCTION N/A 06/27/2015   Procedure: REMOVAL MAXILLARY PALATAL TORUS.  REMOVAL MANDIBULAR TORUS AND EXOSTOSIS.  ;  Surgeon: Diona Browner, DDS;  Location: [facility];  Service: Oral Surgery;  Laterality: N/A;  . OVARIAN CYST REMOVAL  2008  . POLYPECTOMY      Family History  Problem Relation Age of Onset  . Diabetes type II Mother   . Hypertension Mother   . Arthritis Mother   . Diabetes Mother   . Mental illness Mother        bipolar  . Diabetes type II Maternal Aunt   . Hypertension Father   . Heart murmur Father   . Arthritis Father   . Stroke Father   . Alopecia Sister   . Alopecia Brother   . Alopecia Sister   . Mental retardation Sister         depression  . Prostate cancer Paternal Grandfather   . Cancer Maternal Uncle   . Colon cancer Neg Hx   . Esophageal cancer Neg Hx   . Rectal cancer Neg Hx   . Stomach cancer Neg Hx   . Colon polyps Neg Hx     Social History   Tobacco Use  . Smoking status: Current Every Day Smoker    Packs/day: 0.33    Years: 44.00    Pack years: 14.52    Types: Cigarettes  . Smokeless tobacco: Never Used  Substance Use Topics  . Alcohol use: Yes    Alcohol/week: 0.0 oz    Comment: occasionally  . Drug use: Yes    Types: Marijuana, Cocaine    Prior to Admission medications   Medication Sig Start Date End Date Taking? Authorizing Provider  acetaminophen (TYLENOL) 500 MG tablet Take 500 mg by mouth once.   Yes [provider]  albuterol (VENTOLIN HFA) 108 (90 Base) MCG/ACT inhaler INHALE TWO PUFFS BY MOUTH EVERY 6 HOURS AS NEEDED FOR SHORTNESS OF BREATH 05/26/18  Yes Jaynee Eagles, PA-C  amLODipine (NORVASC) 5 MG tablet Take 1 tablet (5 mg total) by mouth daily. 06/27/18  Yes Wardell Honour, MD  budesonide-formoterol [facility]) 80-4.5 MCG/ACT inhaler Inhale 2 puffs into the lungs 2 (two) times daily. 06/13/18  Yes Wardell Honour, MD  citalopram (CELEXA) 10 MG tablet Take 1 tablet (10 mg total) by mouth daily. For depression 05/26/18  Yes Jaynee Eagles, PA-C  diphenhydrAMINE-APAP, sleep, (TYLENOL PM EXTRA STRENGTH PO) Take 500 mg by mouth once.    Yes [provider]  ferrous sulfate 325 (65 FE) MG tablet Take 1 tablet (325 mg total) by mouth 2 (two) times daily with a meal. 07/05/18  Yes Wardell Honour, MD  gabapentin (NEURONTIN) 400 MG capsule Take 1 capsule (400 mg total) by mouth 3 (three) times daily. 06/13/18  Yes Wardell Honour, MD  methocarbamol (ROBAXIN) 500 MG tablet Take 2 tablets (1,000 mg total) by mouth every 8 (eight) hours as needed for muscle spasms. 05/24/18  Yes Debbe Odea, MD  QUEtiapine (SEROQUEL) 300 MG tablet Take 1 tablet (300 mg total) by mouth at bedtime.  Mood control 05/26/18  Yes Jaynee Eagles, PA-C  diclofenac sodium (VOLTAREN) 1 % GEL Apply 2 g topically 4 (four) times daily. Patient not taking: Reported on 07/09/2018 05/24/18   Debbe Odea, MD  ipratropium-albuterol (DUONEB) 0.5-2.5 (3) MG/3ML SOLN Take 3 mLs by nebulization every 4 (four) hours as needed (shortness of breath). Reported on 12/02/2015  [provider]  LamoTRIgine (LAMICTAL XR) 25 MG TB24 24 hour tablet take 1 tablet daily for 2 weeks, then increase to 2 tablets daily for 2 weeks 05/24/18   Debbe Odea, MD  Misc. Devices ([place]) MISC Sig: one rolling walker any name brand with ambulation. DDD lumbar spine 07/05/18   Wardell Honour, MD  triamcinolone cream (KENALOG) 0.1 % APPLY  CREAM EXTERNALLY TWICE DAILY Patient not taking: Reported on 07/09/2018 09/02/17   Wardell Honour, MD  valACYclovir (VALTREX) 1000 MG tablet Take 2 tabs po at symptom onset, repeat in 12 hours Patient taking differently: Take 2,000 mg by mouth daily as needed (flare ups).  07/29/17   Wardell Honour, MD    Current Facility-Administered Medications  Medication Dose Route Frequency Provider Last Rate Last Dose  . 0.9 %  sodium chloride infusion   Intravenous Continuous Eugenie Filler, MD 20 mL/hr at 07/09/18 1541    . acetylcysteine (ACETADOTE) 40,000 mg in dextrose 5 % 1,000 mL (40 mg/mL) infusion  15 mg/kg/hr Intravenous Continuous Eugenie Filler, MD      . amLODipine (NORVASC) tablet 5 mg  5 mg Oral Daily Eugenie Filler, MD   5 mg at 07/10/18 8938  . citalopram (CELEXA) tablet 10 mg  10 mg Oral Daily Eugenie Filler, MD   10 mg at 07/10/18 1017  . diclofenac sodium (VOLTAREN) 1 % transdermal gel 2 g  2 g Topical QID Eugenie Filler, MD   2 g at 07/10/18 1429  . ferrous sulfate tablet 325 mg  325 mg Oral BID WC Eugenie Filler, MD   325 mg at 07/10/18 0746  . folic acid (FOLVITE) tablet 1 mg  1 mg Oral Daily Eugenie Filler, MD   1 mg at 07/10/18 0902  .  gabapentin (NEURONTIN) capsule 400 mg  400 mg Oral TID Eugenie Filler, MD   400 mg at 07/10/18 0902  . ipratropium-albuterol (DUONEB) 0.5-2.5 (3) MG/3ML nebulizer solution 3 mL  3 mL Nebulization Q4H PRN Eugenie Filler, MD      . LORazepam (ATIVAN) injection 0-4 mg  0-4 mg Intravenous Q6H Eugenie Filler, MD   2 mg at 07/10/18 5102   Followed by  . [START ON 07/11/2018] LORazepam (ATIVAN) injection 0-4 mg  0-4 mg Intravenous Q12H Eugenie Filler, MD      . LORazepam (ATIVAN) tablet 1 mg  1 mg Oral Q6H PRN Eugenie Filler, MD   1 mg at 07/09/18 2034   Or  . LORazepam (ATIVAN) injection 1 mg  1 mg Intravenous Q6H PRN Eugenie Filler, MD      . methocarbamol (ROBAXIN) tablet 1,000 mg  1,000 mg Oral Q8H PRN Eugenie Filler, MD   1,000 mg at 07/10/18 0902  . mometasone-formoterol (DULERA) 100-5 MCG/ACT inhaler 2 puff  2 puff Inhalation BID Eugenie Filler, MD   2 puff at 07/10/18 [phone]  . multivitamin with minerals tablet 1 tablet  1 tablet Oral Daily Eugenie Filler, MD   1 tablet at 07/10/18 0902  . nicotine (NICODERM CQ - dosed in mg/24 hours) patch 14 mg  14 mg Transdermal Q24H Eugenie Filler, MD   14 mg at 07/09/18 2018  . ondansetron (ZOFRAN) tablet 4 mg  4 mg Oral Q6H PRN Eugenie Filler, MD       Or  . ondansetron [facility]) injection 4 mg  4 mg Intravenous Q6H PRN Irine Seal  V, MD      . pantoprazole (PROTONIX) EC tablet 40 mg  40 mg Oral BID AC Eugenie Filler, MD   40 mg at 07/10/18 0746  . predniSONE (STERAPRED UNI-PAK 21 TAB) tablet 10 mg  10 mg Oral PC supper Eugenie Filler, MD      . Derrill Memo ON 07/11/2018] predniSONE (STERAPRED UNI-PAK 21 TAB) tablet 10 mg  10 mg Oral 3 x daily with food Eugenie Filler, MD      . Derrill Memo ON 07/12/2018] predniSONE (STERAPRED UNI-PAK 21 TAB) tablet 10 mg  10 mg Oral 4X daily taper Eugenie Filler, MD      . predniSONE (STERAPRED UNI-PAK 21 TAB) tablet 20 mg  20 mg Oral Nightly Eugenie Filler, MD      .  Derrill Memo ON 07/11/2018] predniSONE (STERAPRED UNI-PAK 21 TAB) tablet 20 mg  20 mg Oral Nightly Eugenie Filler, MD      . senna-docusate (Senokot-S) tablet 1 tablet  1 tablet Oral BID Eugenie Filler, MD   1 tablet at 07/10/18 [phone]  . sodium phosphate (FLEET) 7-19 GM/118ML enema 1 enema  1 enema Rectal Once PRN Eugenie Filler, MD      . sorbitol 70 % solution 30 mL  30 mL Oral Daily PRN Eugenie Filler, MD      . thiamine (VITAMIN B-1) tablet 100 mg  100 mg Oral Daily Eugenie Filler, MD   100 mg at 07/10/18 0626   Or  . thiamine (B-1) injection 100 mg  100 mg Intravenous Daily Eugenie Filler, MD        Allergies as of 07/09/2018 - Review Complete 07/09/2018  Allergen Reaction Noted  . Fruit & vegetable daily [nutritional supplements] Shortness Of Breath 06/27/2015  . Iodinated diagnostic agents Swelling 07/13/2016  . Aloe vera Rash 07/09/2018     Review of Systems:    Constitutional: No fever or chills Skin: No rash  Cardiovascular: No chest pain Respiratory: No SOB  Gastrointestinal: See HPI and otherwise negative Genitourinary: No dysuria Neurological: No headache, dizziness or syncope Musculoskeletal: No new muscle or joint pain Hematologic: No bruising + nosebleeds Psychiatric: No history of depression or anxiety    Physical Exam:  Vital signs in last 24 hours: Temp:  [97.8 F (36.6 C)-98.7 F (37.1 C)] 97.8 F (36.6 C) (07/29 1208) Pulse Rate:  [75-95] 95 (07/29 1208) Resp:  [14-25] 16 (07/29 1208) BP: (119-176)/(54-93) 144/79 (07/29 1208) SpO2:  [94 %-100 %] 100 % (07/29 1208) Weight:  [107 lb 2.3 oz (48.6 kg)] 107 lb 2.3 oz (48.6 kg) (07/29 0639) Last BM Date: 07/09/18 General:   Pleasant AA female appears to be in NAD, Well developed, Well nourished, alert and cooperative Head:  Normocephalic and atraumatic. Eyes:   PEERL, EOMI. No icterus. Conjunctiva pink. Ears:  Normal auditory acuity. Neck:  Supple Throat: Oral cavity and pharynx without  inflammation, swelling or lesion. Teeth in good condition. Lungs: Respirations even and unlabored. Lungs clear to auscultation bilaterally.   No wheezes, crackles, or rhonchi.  Heart: Normal S1, S2. No MRG. Regular rate and rhythm. No peripheral edema, cyanosis or pallor.  Abdomen:  Soft, nondistended, moderate epigastric ttp. No rebound or guarding. Normal bowel sounds. No appreciable masses or hepatomegaly. Rectal:  Not performed.  Msk:  Symmetrical without gross deformities. Extremities:  Without edema, no deformity or joint abnormality.  Neurologic:  Alert and  oriented x4;  grossly normal neurologically.  Skin:   Dry  and intact without significant lesions or rashes. Psychiatric: Demonstrates good judgement and reason without abnormal affect or behaviors.   LAB RESULTS: Recent Labs    07/09/18 1516 07/10/18 0512 07/10/18 1446  WBC 4.8 3.6*  --   HGB 9.8* 8.0* 8.7*  HCT 30.3* 24.9* 27.4*  PLT 245 175  --    BMET Recent Labs    07/09/18 1516 07/10/18 0512  NA 140 138  K 3.3* 3.0*  CL 105 110  CO2 25 20*  GLUCOSE 105* 120*  BUN 10 <5*  CREATININE 1.39* 0.71  CALCIUM 9.9 8.4*   LFT Recent Labs    07/09/18 1516  PROT 8.7*  ALBUMIN 4.4  AST 73*  ALT 39  ALKPHOS 74  BILITOT 0.7   PT/INR Recent Labs    07/09/18 1516  LABPROT 12.8  INR 0.97    STUDIES: Acute Abdominal Series  Result Date: 07/09/2018 CLINICAL DATA:  Overdose EXAM: DG ABDOMEN ACUTE W/ 1V CHEST COMPARISON:  CT 05/23/2018 FINDINGS: There is no evidence of dilated bowel loops or free intraperitoneal air. No radiopaque calculi or other significant radiographic abnormality is seen. Heart size and mediastinal contours are within normal limits. Both lungs are clear. IMPRESSION: Negative abdominal radiographs.  No acute cardiopulmonary disease. Electronically Signed   By: Rolm Baptise M.D.   On: 07/09/2018 20:52   [place] Ekg Site Rite  Result Date: 07/09/2018 If Site Rite image not attached, placement  could not be confirmed due to current cardiac rhythm.    Impression / Plan:   Impression: 1. Microcytic anemia with melena: hgb 9.8-->8.0-->8.7, h/o melena over the past couple of years, related to days of etoh use; concern for PUD vs other source of upper GI bleed 2. Tylenol overdose: on Mucomyst, psych eval given suicidal ideation, LFT's with elevated AST at 73, others normal 3. Abdominal pain: diffuse upper abdominal pain, for years, some worse past couple days; consider relation to PUD vs erosive gastritis 4. Polysubstance abuse/cocaine use 5. Alcohol use 6. Hypokalemia: K 3.0- has been given 23mEq since last check, suspect this will normalize in the am 7. COPD  Plan: 1. Plan for EGD tomorrow with Dr. Havery Moros. Discussed risks, benefits, limitations and alternatives and the patient agrees to proceed. 2. Patient will be NPO after midnight 3. Agree with BID PPI 4. Will need to ensure that potassium has normalized on am labs, if not may require further supplementation prior to procedure 5. Please await any further recommendations from Dr. Havery Moros later today  Thank you for your kind consultation, we will continue to follow.  Lavone Nian Takai [place]iar[place]te  07/10/2018, 3:10 PM

## 2018-07-10 NOTE — Progress Notes (Signed)
[place] for Acetylcysteine  Indication: APAP overdose  Allergies  Allergen Reactions  . Fruit & Vegetable Daily [Nutritional Supplements] Shortness Of Breath    Aloe  . Iodinated Diagnostic Agents Swelling    Swelling and itching of left side of her face only after CT SI injection(no steroid used)  . Aloe Vera Rash    Patient Measurements: Height: 5\' 4"  (162.6 cm) Weight: 107 lb 2.3 oz (48.6 kg) IBW/kg (Calculated) : 54.7  Vital Signs: Temp: 97.8 F (36.6 C) (07/29 1208) Temp Source: Axillary (07/29 1208) BP: 144/79 (07/29 1208) Pulse Rate: 95 (07/29 1208)  Labs: Recent Labs    07/09/18 1516 07/09/18 1654 07/09/18 1845 07/10/18 0512 07/10/18 1446  WBC 4.8  --   --  3.6*  --   HGB 9.8*  --   --  8.0* 8.7*  HCT 30.3*  --   --  24.9* 27.4*  PLT 245  --   --  175  --   CREATININE 1.39*  --   --  0.71  --   MG  --  1.5*  --  2.1  --   PHOS  --   --  2.9  --   --   ALBUMIN 4.4  --   --   --  3.6  PROT 8.7*  --   --   --  7.7  AST 73*  --   --   --  73*  ALT 39  --   --   --  39  ALKPHOS 74  --   --   --  92  BILITOT 0.7  --   --   --  0.6  BILIDIR  --   --   --   --  0.1  IBILI  --   --   --   --  0.5   Estimated Creatinine Clearance: 58.1 mL/min (by C-G formula based on SCr of 0.71 mg/dL).  Medications:  Infusions:  . sodium chloride 20 mL/hr at 07/09/18 1541  . acetylcysteine (ACETADOTE) infusion 40 g/1000 ML      Assessment: 7 YOF with suicide attempt via APAP ingestion, also using cocaine.  Apparently ingested ~15 APAP tabs yesterday and ~60 tabs this am.  APAP level at time of admission elevated at 131.  EDP has consulted with  poison control per his note.  IV acetylcysteine bolus and infusion ordered.  Per outpatient notes patient unintentionally overdosing on APAP d/t pain for approx last month.  LFT were significantly elevated on 7/2 and have trended down, she received acetylcysteine as outpatient 7/2.   22  hour labs:  APAP level:  Initially 131, decreased to < 10  INR = 0.97, increased to 1.02  AST 73, ALT 39 - both remain stable after previously elevated earlier this month and now actually improved   Goal of Therapy:  APAP < 10  Plan:   Poison control recommends to d/c N-acetylcysteine.  Per MD, OK to d/c.   Gretta Arab PharmD, BCPS Pager [phone] 07/10/2018 4:08 PM

## 2018-07-10 NOTE — Evaluation (Signed)
Occupational Therapy Evaluation Patient Details Name: Melissa Bridges MRN: [id number] DOB: [birth year] Today's Date: 07/10/2018    History of Present Illness  Melissa Bridges is a 57 y.o. female with medical history significant of polysubstance abuse including crack cocaine, COPD, history of seizures, hepatitis C, schizoaffective disorder, lumbar disc herniation, chronic left hip-midback-left shoulder pain presented to the ED with Tylenol overdose with intent to kill self   Clinical Impression   This 57 yo female admitted with above presents to acute OT with decreased balance, decreased safety with mobility, increased pain all affecting her safety and independence with basic ADLs. She will benefit from acute OT with follow up OT at SNF.    Follow Up Recommendations  SNF;Supervision/Assistance - 24 hour    Equipment Recommendations  None recommended by OT       Precautions / Restrictions Precautions Precautions: Fall Restrictions Weight Bearing Restrictions: No      Mobility Bed Mobility Overal bed mobility: Needs Assistance Bed Mobility: Supine to Sit;Sit to Supine     Supine to sit: Supervision Sit to supine: Supervision      Transfers Overall transfer level: Needs assistance Equipment used: Rolling walker (2 wheeled) Transfers: Sit to/from Stand Sit to Stand: Min assist              Balance Overall balance assessment: Needs assistance Sitting-balance support: No upper extremity supported;Feet supported Sitting balance-Leahy Scale: Good     Standing balance support: Bilateral upper extremity supported Standing balance-Leahy Scale: Poor                             ADL either performed or assessed with clinical judgement   DL Overall ADL's : Needs assistance/impaired Eating/Feeding: Independent;Sitting   Grooming: Wash/dry face;Minimal assistance;Standing Grooming Details (indicate cue type and reason): at sink Upper Body Bathing: Min  guard;Sitting   Lower Body Bathing: Minimal assistance;Sit to/from stand   Upper Body Dressing : Min guard;Sitting   Lower Body Dressing: Minimal assistance;Sit to/from stand   Toilet Transfer: Minimal assistance;Ambulation;RW;Regular Glass blower/designer Details (indicate cue type and reason): Pt tends to drag her LLE Toileting- Clothing Manipulation and Hygiene: Minimal assistance;Sit to/from stand               Vision Patient Visual Report: No change from baseline              Pertinent Vitals/Pain Pain Assessment: Faces Faces Pain Scale: Hurts little more Pain Location: left hip/leg, mid back, left shoulder Pain Descriptors / Indicators: Aching;Sore Pain Intervention(s): Limited activity within patient's tolerance;Monitored during session;Repositioned(heat for back)     Hand Dominance Right   Extremity/Trunk Assessment Upper Extremity Assessment Upper Extremity Assessment: LUE deficits/detail LUE Deficits / Details: Old fx (looks like old broken clavicle), decreased AROM shoulder and weak grip. Pt reports she does not use this arm much due to pain (put her socks on one handed) LUE Coordination: decreased fine motor;decreased gross motor           Communication Communication Communication: No difficulties   Cognition Arousal/Alertness: Awake/alert Behavior During Therapy: Impulsive Overall Cognitive Status: No family/caregiver present to determine baseline cognitive functioning                                 General Comments: Pt moving a jerking pace with RW, VCs to pay attention to lines and where RW was with going ot bathroom  and back. Feels she will be fine at home with RW (pointed out to her how unsafe she was with RW during this session)              Home Living Family/patient expects to be discharged to:: Private residence Living Arrangements: Alone   Type of Home: Apartment Home Access: Level entry     Home Layout: One  level     Bathroom Shower/Tub: Tub/shower unit;Curtain   [place]: Grab bars - tub/shower          Prior Functioning/Environment Level of Independence: Independent                 OT Problem List: Decreased strength;Decreased range of motion;Impaired balance (sitting and/or standing);Pain;Decreased cognition      OT Treatment/Interventions: Self-care/ADL training;Balance training;DME and/or AE instruction;Patient/family education;Cognitive remediation/compensation    OT Goals(Current goals can be found in the care plan section) Acute Rehab OT Goals Patient Stated Goal: to get pain better OT Goal Formulation: With patient Time For Goal Achievement: 07/24/18 Potential to Achieve Goals: Good  OT Frequency: Min 2X/week   Barriers to D/C: Decreased caregiver support             AM-PAC PT "6 Clicks" Daily Activity     Outcome Measure Help from another person eating meals?: None Help from another person taking care of personal grooming?: A Little Help from another person toileting, which includes using toliet, bedpan, or urinal?: A Little Help from another person bathing (including washing, rinsing, drying)?: A Little Help from another person to put on and taking off regular upper body clothing?: A Little Help from another person to put on and taking off regular lower body clothing?: A Little 6 Click Score: 19   End of Session Equipment Utilized During Treatment: Rolling walker  Activity Tolerance: Patient tolerated treatment well Patient left: in bed;with call bell/phone within Therapist, music)  OT Visit Diagnosis: Unsteadiness on feet (R26.81);Repeated falls (R29.6);History of falling (Z91.81);Pain Pain - part of body: (left shoulder and leg, mid back)                Time: 8891-6945 OT Time Calculation (min): 18 min Charges:  OT General Charges $OT Visit: 1 Visit OT Evaluation $OT Eval Moderate Complexity: [address] [place] [phone] 07/10/2018

## 2018-07-11 ENCOUNTER — Inpatient Hospital Stay (HOSPITAL_COMMUNITY): Payer: 59 | Admitting: Anesthesiology

## 2018-07-11 ENCOUNTER — Encounter (HOSPITAL_COMMUNITY)
Admission: EM | Disposition: A | Discharge status: Home Health | Payer: Self-pay | Source: Home / Self Care | Attending: Family Medicine

## 2018-07-11 ENCOUNTER — Encounter (HOSPITAL_COMMUNITY): Payer: Self-pay | Admitting: *Deleted

## 2018-07-11 DIAGNOSIS — K921 Melena: Secondary | ICD-10-CM

## 2018-07-11 DIAGNOSIS — K3189 Other diseases of stomach and duodenum: Secondary | ICD-10-CM

## 2018-07-11 DIAGNOSIS — K449 Diaphragmatic hernia without obstruction or gangrene: Secondary | ICD-10-CM

## 2018-07-11 DIAGNOSIS — K31819 Angiodysplasia of stomach and duodenum without bleeding: Secondary | ICD-10-CM

## 2018-07-11 HISTORY — PX: HOT HEMOSTASIS: SHX5433

## 2018-07-11 HISTORY — PX: ESOPHAGOGASTRODUODENOSCOPY (EGD) WITH PROPOFOL: SHX5813

## 2018-07-11 HISTORY — PX: BIOPSY: SHX5522

## 2018-07-11 LAB — CBC WITH DIFFERENTIAL/PLATELET
BASOS ABS: 0 10*3/uL (ref 0.0–0.1)
Basophils Relative: 0 %
EOS ABS: 0 10*3/uL (ref 0.0–0.7)
Eosinophils Relative: 0 %
HEMATOCRIT: 28.9 % — AB (ref 36.0–46.0)
HEMOGLOBIN: 9.1 g/dL — AB (ref 12.0–15.0)
LYMPHS PCT: 6 %
Lymphs Abs: 0.7 10*3/uL (ref 0.7–4.0)
MCH: 29.2 pg (ref 26.0–34.0)
MCHC: 31.5 g/dL (ref 30.0–36.0)
MCV: 92.6 fL (ref 78.0–100.0)
MONOS PCT: 5 %
Monocytes Absolute: 0.6 10*3/uL (ref 0.1–1.0)
NEUTROS ABS: 10.2 10*3/uL — AB (ref 1.7–7.7)
NEUTROS PCT: 89 %
PLATELETS: 201 10*3/uL (ref 150–400)
RBC: 3.12 MIL/uL — AB (ref 3.87–5.11)
RDW: 22.1 % — AB (ref 11.5–15.5)
WBC: 11.5 10*3/uL — AB (ref 4.0–10.5)

## 2018-07-11 LAB — BASIC METABOLIC PANEL
ANION GAP: 7 (ref 5–15)
BUN: 11 mg/dL (ref 6–20)
CALCIUM: 10.5 mg/dL — AB (ref 8.9–10.3)
CO2: 28 mmol/L (ref 22–32)
Chloride: 105 mmol/L (ref 98–111)
Creatinine, Ser: 0.72 mg/dL (ref 0.44–1.00)
GFR calc Af Amer: >60 mL/min (ref >60)
Glucose, Bld: 117 mg/dL — ABNORMAL HIGH (ref 70–99)
POTASSIUM: 4.2 mmol/L (ref 3.5–5.1)
SODIUM: 140 mmol/L (ref 135–145)

## 2018-07-11 LAB — PROTIME-INR
INR: 1.04
Prothrombin Time: 13.5 seconds (ref 11.4–15.2)

## 2018-07-11 LAB — URINE CULTURE: Culture: NO GROWTH

## 2018-07-11 LAB — HEPATIC FUNCTION PANEL
ALK PHOS: 76 U/L (ref 38–126)
ALT: 41 U/L (ref 0–44)
AST: 62 U/L — AB (ref 15–41)
Albumin: 4.2 g/dL (ref 3.5–5.0)
BILIRUBIN DIRECT: 0.1 mg/dL (ref 0.0–0.2)
BILIRUBIN TOTAL: 0.8 mg/dL (ref 0.3–1.2)
Indirect Bilirubin: 0.7 mg/dL (ref 0.3–0.9)
Total Protein: 8.1 g/dL (ref 6.5–8.1)

## 2018-07-11 LAB — ACETAMINOPHEN LEVEL

## 2018-07-11 LAB — GLUCOSE, CAPILLARY: GLUCOSE-CAPILLARY: 121 mg/dL — AB (ref 70–99)

## 2018-07-11 LAB — MAGNESIUM: Magnesium: 1.9 mg/dL (ref 1.7–2.4)

## 2018-07-11 SURGERY — ESOPHAGOGASTRODUODENOSCOPY (EGD) WITH PROPOFOL
Anesthesia: Monitor Anesthesia Care

## 2018-07-11 MED ORDER — PROPOFOL 500 MG/50ML IV EMUL
INTRAVENOUS | Status: DC | PRN
  Administered 2018-07-11: 120 ug/kg/min via INTRAVENOUS

## 2018-07-11 MED ORDER — LIDOCAINE 2% (20 MG/ML) 5 ML SYRINGE
INTRAMUSCULAR | Status: DC | PRN
  Administered 2018-07-11 (×2): 50 mg via INTRAVENOUS

## 2018-07-11 MED ORDER — AMLODIPINE BESYLATE 10 MG PO TABS
10.0000 mg | ORAL_TABLET | Freq: Every day | ORAL | Status: DC
Start: 2018-07-11 — End: 2018-07-15
  Administered 2018-07-11 – 2018-07-15 (×5): 10 mg via ORAL
  Filled 2018-07-11 (×5): qty 1

## 2018-07-11 MED ORDER — LACTATED RINGERS IV SOLN
INTRAVENOUS | Status: DC
  Administered 2018-07-11: 11:00:00 via INTRAVENOUS

## 2018-07-11 MED ORDER — PROPOFOL 10 MG/ML IV BOLUS
INTRAVENOUS | Status: DC | PRN
  Administered 2018-07-11 (×5): 20 mg via INTRAVENOUS

## 2018-07-11 MED ORDER — SODIUM CHLORIDE 0.9 % IV SOLN: INTRAVENOUS | Status: DC

## 2018-07-11 MED ORDER — PROPOFOL 10 MG/ML IV BOLUS
INTRAVENOUS | Status: AC
  Filled 2018-07-11: qty 40

## 2018-07-11 SURGICAL SUPPLY — 15 items

## 2018-07-11 NOTE — Transfer of Care (Signed)
Immediate Anesthesia Transfer of Care Note  Patient: Melissa Bridges  Procedure(s) Performed: ESOPHAGOGASTRODUODENOSCOPY (EGD) WITH PROPOFOL (N/A ) HOT HEMOSTASIS (ARGON PLASMA COAGULATION/BICAP) (N/A ) BIOPSY  Patient Location: Endoscopy Unit  Anesthesia Type:MAC  Level of Consciousness: awake  Airway & Oxygen Therapy: Patient Spontanous Breathing and Patient connected to nasal cannula oxygen  Post-op Assessment: Report given to RN and Post -op Vital signs reviewed and stable  Post vital signs: Reviewed and stable  Last Vitals:  Vitals Value Taken Time  BP 148/84 07/11/2018 12:04 PM  Temp    Pulse 92 07/11/2018 12:04 PM  Resp 15 07/11/2018 12:04 PM  SpO2 99 % 07/11/2018 12:04 PM  Vitals shown include unvalidated device data.  Last Pain:  Vitals:   07/11/18 1040  TempSrc: Oral  PainSc: 8          Complications: No apparent anesthesia complications

## 2018-07-11 NOTE — Op Note (Signed)
[facility] Patient Name: Melissa Bridges Procedure Date: 07/11/2018 MRN: [id number] Attending MD: Willaim Rayas. Adela Lank , MD Date of Birth: 26-Feb-1961 CSN: [id number] Age: 57 Admit Type: Inpatient Procedure:                Upper GI endoscopy Indications:              Epigastric abdominal pain, Iron deficiency anemia,                            episodic Melena Providers:                Willaim Rayas. Adela Lank, MD, Tomma Rakers, RN,                            Arlee Muslim Tech., Technician, Lauree Chandler                            [place], CRNA Referring MD:              Medicines:                Monitored Anesthesia Care Complications:            No immediate complications. Estimated blood loss:                            Minimal. Estimated Blood Loss:     Estimated blood loss was minimal. Procedure:                Pre-Anesthesia Assessment:                           - Prior to the procedure, a History and Physical                            was performed, and patient medications and                            allergies were reviewed. The patient's tolerance of                            previous anesthesia was also reviewed. The risks                            and benefits of the procedure and the sedation                            options and risks were discussed with the patient.                            All questions were answered, and informed consent                            was obtained. Prior Anticoagulants: The patient has                            taken no previous anticoagulant or  antiplatelet                            agents. ASA Grade Assessment: III - A patient with                            severe systemic disease. After reviewing the risks                            and benefits, the patient was deemed in                            satisfactory condition to undergo the procedure.                           After obtaining informed consent, the  endoscope was                            passed under direct vision. Throughout the                            procedure, the patient's blood pressure, pulse, and                            oxygen saturations were monitored continuously. The                            GIF-H190 ([id number]) Olympus adult endoscope was                            introduced through the mouth, and advanced to the.                            The GIF-H190 ([id number]) Olympus adult endoscope was                            introduced through the mouth, and advanced to the                            second part of duodenum. The upper GI endoscopy was                            accomplished without difficulty. The patient                            tolerated the procedure well. Scope In: Scope Out: Findings:      Esophagogastric landmarks were identified: the Z-line was found at 35       cm, the gastroesophageal junction was found at 35 cm and the upper       extent of the gastric folds was found at 37 cm from the incisors.      A 2 cm hiatal hernia was present.      The exam of the esophagus was otherwise normal.      Two no bleeding angioectasias were  found in the gastric body.       Fulguration to ablate the lesion to prevent bleeding by argon plasma was       successful.      The exam of the stomach was otherwise normal.      Biopsies were taken with a cold forceps in the gastric body, at the       incisura and in the gastric antrum for histology / H pylori given the       patients symptoms.      A single diminutive angiodysplastic lesion was found in the second       portion of the duodenum. Fulguration to ablate the lesion to prevent       bleeding by argon plasma was successful.      The exam of the duodenum was otherwise normal. Impression:               - Esophagogastric landmarks identified.                           - 2 cm hiatal hernia.                           - Normal esophagus                            - Two non-bleeding angioectasias in the stomach.                            Treated with argon plasma coagulation (APC).                           - Normal stomach otherwise - biopsied obtained to                            rule out H pylori                           - A single angiodysplastic lesion in the duodenum.                            Treated with argon plasma coagulation (APC).                           AVMs could be causing the patient's anemia. Moderate Sedation:      No moderate sedation, case performed with MAC Recommendation:           - Return patient to hospital ward for ongoing care.                           - Resume previous diet.                           - Continue present medications.                           - Start omeprazole 40mg  once daily for symptoms                           -  Avoid NSAIDs                           - Continue oral iron, consider dose of IV iron                            while inpatient                           - Await pathology results. We will notify her of                            results and would recommend repeat CBC as                            outpatient in a few weeks to ensure stable,                            patient's symptoms are chronic and she is not                            actively bleeding.                           We will sign off at this time, please call with                            additional questions. We will follow up with her as                            outpatient Procedure Code(s):        --- Professional ---                           43255, 57, Esophagogastroduodenoscopy, flexible,                            transoral; with control of bleeding, any method                           43239, Esophagogastroduodenoscopy, flexible,                            transoral; with biopsy, single or multiple Diagnosis Code(s):        --- Professional ---                           K44.9, Diaphragmatic hernia  without obstruction or                            gangrene                           K31.819, Angiodysplasia of stomach and duodenum  without bleeding                           R10.13, Epigastric pain                           D50.9, Iron deficiency anemia, unspecified                           K92.1, Melena (includes Hematochezia) CPT copyright 2017 American Medical Association. All rights reserved. The codes documented in this report are preliminary and upon coder review may  be revised to meet current compliance requirements. Viviann Spare P. Meshia Rau, MD 07/11/2018 12:12:22 PM This report has been signed electronically. Number of Addenda: 0

## 2018-07-11 NOTE — Progress Notes (Signed)
PROGRESS NOTE    Melissa Bridges  NLG:[id number] DOB: 11-Oct-1961 DOA: 07/09/2018 PCP: Forrest Moron, MD    Brief Narrative: Patient is a pleasant 57 year old lady history of polysubstance abuse including crack cocaine, COPD, history of seizures, hep C, alcohol use, schizoaffective disorder, chronic lumbar disc herniation presented to the ED with a Tylenol overdose.  Patient also noted to be anemic with a severe iron deficiency anemia.  Patient pancultured.  Patient placed on Mucomyst and poison control contacted.   Assessment & Plan:   Principal Problem:   Schizoaffective disorder, bipolar type ([place]) Active Problems:   Suicidal ideation   Smoker   Chronic hepatitis C without hepatic coma (HCC)   Hypertension   Lower back pain   Seizure disorder (HCC)   Lumbar disc herniation   Hypokalemia   Tylenol overdose   Abdominal pain   COPD (chronic obstructive pulmonary disease) (HCC)   Cocaine abuse (HCC)   Hypomagnesemia   Chronic left hip pain   Anemia   Overdose   Melena  #1 Tylenol overdose/suicidal ideation Patient presented with a Tylenol overdose stated that she had significant back pain which was not as relieved with her crack cocaine and also had suicidal ideation as her grand daughter had told her she could not attend her graduation due to her history of polysubstance use.  Patient was placed on Mucomyst.  Repeat Tylenol level less than 10.  Tylenol level on admission was 131.  LFTs with no significant change.  INR at 1.04.  Discontinued Mucomyst per recommendations from poison control.  Supportive care.  Patient has been seen by psychiatry who are recommending inpatient psychiatric admission and if patient refuses voluntary admission patient will need to be IVC.    2.  Severe iron deficiency anemia Patient noted to be anemic on admission with a hemoglobin of 9.8.  Hemoglobin at 9.1 today.  Patient on admission with complaints of melanotic stools.  Anemia panel consistent  with a severe iron deficiency anemia with iron level of 12, ferritin of 10.  Hemoglobin trending down.  FOBT negative.  Patient noted to have a colonoscopy 06/03/2016 with a large polyp that was removed via piecemeal as well as nonbleeding internal hemorrhoids.  Case discussed with GI and patient status post upper endoscopy today 07/11/2018 for probable upper endoscopy plus or minus colonoscopy joint his hospitalization which hopefully can be done tomorrow 07/11/2018 which showed 2 nonbleeding angioectasias status post APC and single angiodysplastic lesion in the duodenum status post APC.  Patient status post IV Feraheme.  GI following.  3.  Hypokalemia/hypomagnesemia Magnesium level at 2.1.  Replete.  4.  Dysuria Urinalysis unremarkable.  Urine cultures negative.  Follow.  5.  Abdominal pain Patient with diffuse upper abdominal pain which she states is chronic in nature.  Patient states she is supposed to follow-up with a gastroenterologist in the outpatient setting.  Lipase levels within normal limits.  Tolerating current diet.  Continue PPI.  Follow.   6.  Polysubstance use/cocaine use Patient states she was clean for 5 years however relapsed recently and has used crack cocaine 3 times last use was the night prior to admission.  Cessation of polysubstance use stressed to patient.  Social work consult.  7.  Alcohol use Patient does endorse alcohol use.  Patient states she drinks as much as she can get her hands on and states he drinks both beer and liquor.    Patient with no significant withdrawal symptoms.  Continue the Ativan withdrawal protocol, thiamine,  multivitamin, folic acid.  Supportive care.    8.  Schizoaffective disorder Patient seen in consultation by psychiatry who are recommending inpatient psychiatric hospitalization given high risk of harm to self.  Landscape architect.  Continue gabapentin, Celexa and Seroquel 150 mg nightly for mood stabilization/insomnia.  Per psychiatry patient  refuses voluntary psychiatric hospitalization will need to pursue involuntary commitment.   9.  Chronic low back pain Patient states she is supposed to follow-up with her neurosurgeon.  Patient with Tylenol overdose and as such cannot placed on Tylenol as needed.  Patient with a history of peptic ulcer disease and now complaining of melanotic stools and as such can't placed on any NSAIDs.  Lidoderm patch was ordered however patient unable to tolerate and as such Lidoderm patch has been discontinued.  Ultram was also discontinued.  Patient placed on Neurontin, Sterapred, Voltaren gel with some clinical improvement.  Monitor for now.   10.  COPD/tobacco abuse. Stable.  Tobacco cessation.  Continue nicotine patch, Dulera and Spiriva.  Duo nebs as needed.  11.  History of seizure disorder Patient not on any anticonvulsant medications.    Discontinued Ultram.  Monitor for now.     #12 hypertension Increase Norvasc to 10 mg daily.     DVT prophylaxis: SCDs Code Status: Full Family Communication: Updated patient.  No family at bedside. Disposition Plan: Inpatient psychiatry when medically and clinically stable.    Consultants:   Gastroenterology: Dr. Havery Moros 07/10/2018  Psychiatry consultation: Dr. Mariea Clonts 07/10/2018.  Procedures:  Acute abdominal series 07/09/2018   Upper endoscopy per Dr. Havery Moros 07/11/2018  Antimicrobials:   None   Subjective: Patient sitting up about to eat her lunch.  Denies any further chest pain no shortness of breath.  States lower back pain slowly improving.  Just returned from upper endoscopy.  Objective: Vitals:   07/11/18 1040 07/11/18 1205 07/11/18 1215 07/11/18 1233  BP: (!) 194/96 (!) 148/84 (!) 170/96 (!) 160/88  Pulse: 84 92 93 85  Resp: (!) 23 15 (!) 22 20  Temp: 98.9 F (37.2 C) 98.5 F (36.9 C)  98.9 F (37.2 C)  TempSrc: Oral Oral  Oral  SpO2: 99% 99% 95% 98%  Weight: 48.6 kg (107 lb 2.3 oz)     Height: 5\' 4"  (1.626 m)        Intake/Output Summary (Last 24 hours) at 07/11/2018 1249 Last data filed at 07/11/2018 1205 Gross per 24 hour  Intake 1026.33 ml  Output 2100 ml  Net -1073.67 ml   Filed Weights   07/09/18 1506 07/10/18 0639 07/11/18 1040  Weight: 47.6 kg (105 lb) 48.6 kg (107 lb 2.3 oz) 48.6 kg (107 lb 2.3 oz)    Examination:  General exam: NAD Respiratory system: CTAB.  No wheezes, no crackles, no rhonchi.  Respiratory effort normal. Cardiovascular system: Regular rate and rhythm no murmurs rubs or gallops.  No JVD.  No lower extremity edema.   Gastrointestinal system: Abdomen is nondistended, soft and decreased tenderness to palpation in the epigastrium.  No organomegaly.  Positive bowel sounds.   Central nervous system: Alert and oriented. No focal neurological deficits. Extremities: Symmetric 5 x 5 power. Skin: No rashes, lesions or ulcers Psychiatry: Judgement and insight appear normal. Mood & affect appropriate.     Data Reviewed: I have personally reviewed following labs and imaging studies  CBC: Recent Labs  Lab 07/05/18 1610 07/09/18 1516 07/10/18 0512 07/10/18 1446 07/11/18 0925  WBC 8.9 4.8 3.6*  --  11.5*  NEUTROABS 5.4  --   --   --  10.2*  HGB 9.9* 9.8* 8.0* 8.7* 9.1*  HCT 32.0* 30.3* 24.9* 27.4* 28.9*  MCV 92 89.1 90.5  --  92.6  PLT 311 245 175  --  660   Basic Metabolic Panel: Recent Labs  Lab 07/05/18 1610 07/09/18 1516 07/09/18 1654 07/09/18 1845 07/10/18 0512 07/11/18 0925  NA 138 140  --   --  138 140  K 3.9 3.3*  --   --  3.0* 4.2  CL 98 105  --   --  110 105  CO2 23 25  --   --  20* 28  GLUCOSE 106* 105*  --   --  120* 117*  BUN 11 10  --   --  <5* 11  CREATININE 1.09* 1.39*  --   --  0.71 0.72  CALCIUM 10.5* 9.9  --   --  8.4* 10.5*  MG  --   --  1.5*  --  2.1 1.9  PHOS  --   --   --  2.9  --   --    GFR: Estimated Creatinine Clearance: 58.1 mL/min (by C-G formula based on SCr of 0.72 mg/dL). Liver Function Tests: Recent Labs  Lab  07/05/18 1610 07/09/18 1516 07/10/18 1446 07/11/18 0925  AST 76* 73* 73* 62*  ALT 42* 39 39 41  ALKPHOS 96 74 92 76  BILITOT 0.4 0.7 0.6 0.8  PROT 8.7* 8.7* 7.7 8.1  ALBUMIN 4.8 4.4 3.6 4.2   Recent Labs  Lab 07/09/18 1516  LIPASE 40   No results for input(s): AMMONIA in the last 168 hours. Coagulation Profile: Recent Labs  Lab 07/09/18 1516 07/10/18 1446 07/11/18 0925  INR 0.97 1.02 1.04   Cardiac Enzymes: No results for input(s): CKTOTAL, CKMB, CKMBINDEX, TROPONINI in the last 168 hours. BNP (last 3 results) No results for input(s): PROBNP in the last 8760 hours. HbA1C: No results for input(s): HGBA1C in the last 72 hours. CBG: Recent Labs  Lab 07/09/18 1509 07/10/18 0734 07/11/18 0749  GLUCAP 112* 233* 121*   Lipid Profile: No results for input(s): CHOL, HDL, LDLCALC, TRIG, CHOLHDL, LDLDIRECT in the last 72 hours. Thyroid Function Tests: No results for input(s): TSH, T4TOTAL, FREET4, T3FREE, THYROIDAB in the last 72 hours. Anemia Panel: Recent Labs    07/09/18 1845  VITAMINB12 602  FOLATE 16.2  FERRITIN 10*  TIBC 593*  IRON 12*  RETICCTPCT 1.1   Sepsis Labs: No results for input(s): PROCALCITON, LATICACIDVEN in the last 168 hours.  Recent Results (from the past 240 hour(s))  Urine Culture     Status: None   Collection Time: 07/05/18  4:14 PM  Result Value Ref Range Status   Urine Culture, Routine Final report  Final   Organism ID, Bacteria Comment  Final    Comment: Mixed urogenital flora Less than 10,000 colonies/mL   Culture, Urine     Status: None   Collection Time: 07/09/18  3:11 PM  Result Value Ref Range Status   Specimen Description   Final    URINE, RANDOM Performed at [facility], [place] [address] [place], [place] [address]    Special Requests   Final    NONE Performed at [facility], [place] [address] [place], [place] [address]    Culture   Final    NO GROWTH Performed at [facility], [place] [address], [place], [place] [address]    Report Status 07/11/2018 FINAL  Final  Radiology Studies: Acute Abdominal Series  Result Date: 07/09/2018 CLINICAL DATA:  Overdose EXAM: DG ABDOMEN ACUTE W/ 1V CHEST COMPARISON:  CT 05/23/2018 FINDINGS: There is no evidence of dilated bowel loops or free intraperitoneal air. No radiopaque calculi or other significant radiographic abnormality is seen. Heart size and mediastinal contours are within normal limits. Both lungs are clear. IMPRESSION: Negative abdominal radiographs.  No acute cardiopulmonary disease. Electronically Signed   By: Rolm Baptise M.D.   On: 07/09/2018 20:52   [place] Ekg Site Rite  Result Date: 07/09/2018 If Site Rite image not attached, placement could not be confirmed due to current cardiac rhythm.       Scheduled Meds: . amLODipine  10 mg Oral Daily  . citalopram  10 mg Oral Daily  . diclofenac sodium  2 g Topical QID  . ferrous sulfate  325 mg Oral BID WC  . folic acid  1 mg Oral Daily  . gabapentin  400 mg Oral TID  . LORazepam  0-4 mg Intravenous Q6H   Followed by  . LORazepam  0-4 mg Intravenous Q12H  . mometasone-formoterol  2 puff Inhalation BID  . multivitamin with minerals  1 tablet Oral Daily  . nicotine  14 mg Transdermal Q24H  . pantoprazole  40 mg Oral BID AC  . predniSONE  10 mg Oral 3 x daily with food  . [START ON 07/12/2018] predniSONE  10 mg Oral 4X daily taper  . predniSONE  20 mg Oral Nightly  . QUEtiapine  150 mg Oral QHS  . senna-docusate  1 tablet Oral BID  . thiamine  100 mg Oral Daily   Or  . thiamine  100 mg Intravenous Daily   Continuous Infusions: . sodium chloride 20 mL/hr at 07/09/18 1541     LOS: 2 days    Time spent: 40 mins    Irine Seal, MD [facility] Pager [phone]  If 7PM-7AM, please contact night-coverage [email or web address] Password [facility] 07/11/2018, 12:49 PM

## 2018-07-11 NOTE — Anesthesia Preprocedure Evaluation (Addendum)
Anesthesia Evaluation  Patient identified by MRN, date of birth, ID band Patient awake    Reviewed: Allergy & Precautions, NPO status , Patient's Chart, lab work & pertinent test results  History of Anesthesia Complications Negative for: history of anesthetic complications  Airway Mallampati: II  TM Distance: >3 FB Neck ROM: Full    Dental  (+) Partial Lower, Edentulous Upper   Pulmonary asthma , COPD,  COPD inhaler, Current Smoker,    breath sounds clear to auscultation       Cardiovascular hypertension, Pt. on medications  Rhythm:Regular Rate:Normal     Neuro/Psych  Headaches, Seizures -,  PSYCHIATRIC DISORDERS Anxiety Depression Bipolar Disorder  Schizoaffective d/o Suicidal ideation w/ hx suicide attempt   GI/Hepatic GERD  Controlled and Medicated,(+)     substance abuse  alcohol use, cocaine use and marijuana use, Hepatitis -, C  Endo/Other  negative endocrine ROS  Renal/GU negative Renal ROS  negative genitourinary   Musculoskeletal  (+) Arthritis ,   Abdominal   Peds  Hematology  (+) anemia ,   Anesthesia Other Findings Hypokalemia  Reproductive/Obstetrics                            Anesthesia Physical Anesthesia Plan  ASA: III  Anesthesia Plan: MAC   Post-op Pain Management:    Induction: Intravenous  PONV Risk Score and Plan: Propofol infusion and Treatment may vary due to age or medical condition  Airway Management Planned: Nasal Cannula and Natural Airway  Additional Equipment: None  Intra-op Plan:   Post-operative Plan:   Informed Consent: I have reviewed the patients History and Physical, chart, labs and discussed the procedure including the risks, benefits and alternatives for the proposed anesthesia with the patient or authorized representative who has indicated his/her understanding and acceptance.   Dental advisory given  Plan Discussed with: CRNA and  Anesthesiologist  Anesthesia Plan Comments:        Anesthesia Quick Evaluation

## 2018-07-11 NOTE — Interval H&P Note (Signed)
History and Physical Interval Note:  07/11/2018 11:38 AM  Melissa Bridges  has presented today for surgery, with the diagnosis of Microcytic Anemia, Melena  The various methods of treatment have been discussed with the patient and family. After consideration of risks, benefits and other options for treatment, the patient has consented to  Procedure(s): ESOPHAGOGASTRODUODENOSCOPY (EGD) WITH PROPOFOL (N/A) as a surgical intervention .  The patient's history has been reviewed, patient examined, no change in status, stable for surgery.  I have reviewed the patient's chart and labs.  Questions were answered to the patient's satisfaction.     [place]

## 2018-07-11 NOTE — Anesthesia Postprocedure Evaluation (Signed)
Anesthesia Post Note  Patient: Barby Colvard  Procedure(s) Performed: ESOPHAGOGASTRODUODENOSCOPY (EGD) WITH PROPOFOL (N/A ) HOT HEMOSTASIS (ARGON PLASMA COAGULATION/BICAP) (N/A ) BIOPSY     Patient location during evaluation: PACU Anesthesia Type: MAC Level of consciousness: awake and alert Pain management: pain level controlled Vital Signs Assessment: post-procedure vital signs reviewed and stable Respiratory status: spontaneous breathing, nonlabored ventilation and respiratory function stable Cardiovascular status: stable and blood pressure returned to baseline Anesthetic complications: no    Last Vitals:  Vitals:   07/11/18 1215 07/11/18 1233  BP: (!) 170/96 (!) 160/88  Pulse: 93 85  Resp: (!) 22 20  Temp:  37.2 C  SpO2: 95% 98%    Last Pain:  Vitals:   07/11/18 1233  TempSrc: Oral  PainSc:                  Audry Pili

## 2018-07-12 DIAGNOSIS — F250 Schizoaffective disorder, bipolar type: Secondary | ICD-10-CM

## 2018-07-12 LAB — GLUCOSE, CAPILLARY: GLUCOSE-CAPILLARY: 103 mg/dL — AB (ref 70–99)

## 2018-07-12 LAB — GC/CHLAMYDIA PROBE AMP (~~LOC~~) NOT AT ARMC
CHLAMYDIA, DNA PROBE: NEGATIVE
NEISSERIA GONORRHEA: NEGATIVE

## 2018-07-12 NOTE — Care Management Important Message (Signed)
Important Message  Patient Details  Name: Melissa Bridges MRN: [id number] Date of Birth: 18-Jun-1961   Medicare Important Message Given:  Yes    Kerin Salen 07/12/2018, 11:01 AMImportant Message  Patient Details  Name: Melissa Bridges MRN: [id number] Date of Birth: 1961-01-09   Medicare Important Message Given:  Yes    Kerin Salen 07/12/2018, 11:01 AM

## 2018-07-12 NOTE — Progress Notes (Signed)
PROGRESS NOTE    Melissa Bridges  XBW:[id number] DOB: 11/21/1961 DOA: 07/09/2018 PCP: Forrest Moron, MD    Brief Narrative: Patient is a pleasant 57 year old lady history of polysubstance abuse including crack cocaine, COPD, history of seizures, hep C, alcohol use, schizoaffective disorder, chronic lumbar disc herniation presented to the ED with a Tylenol overdose.  Patient also noted to be anemic with a severe iron deficiency anemia.  Patient pancultured.  Patient placed on Mucomyst and poison control contacted.  Patient is medically cleared as of 07/12/2018, please transfer to inpatient psychiatric service when bed available  Patient remains medically stable at this time     Assessment & Plan:   Principal Problem:   Schizoaffective disorder, bipolar type ([place]) Active Problems:   Smoker   Chronic hepatitis C without hepatic coma (HCC)   Hypertension   Lower back pain   Seizure disorder (HCC)   Lumbar disc herniation   Hypokalemia   Tylenol overdose   Abdominal pain   COPD (chronic obstructive pulmonary disease) (HCC)   Cocaine abuse ([place])   Hypomagnesemia   Chronic left hip pain   Suicidal ideation   Anemia   Overdose   Melena  #1 Tylenol overdose/suicidal ideation Patient presented with a Tylenol overdose stated that she had significant back pain which was not as relieved with her crack cocaine and also had suicidal ideation as her grand daughter had told her she could not attend her graduation due to her history of polysubstance use.  Patient was placed on Mucomyst.  Repeat Tylenol level less than 10.  Tylenol level on admission was 131.  LFTs with no significant change.  INR at 1.04.  Discontinued Mucomyst per recommendations from poison control.  Supportive care.  Patient has been seen by psychiatry who are recommending inpatient psychiatric admission and if patient refuses voluntary admission patient will need to be IVC.    Patient is medically cleared as of  07/12/2018, please transfer to inpatient psychiatric service when bed available  Patient remains medically stable at this time    2.  Severe iron deficiency anemia Patient noted to be anemic on admission with a hemoglobin of 9.8.  Hgb > 9,   Patient on admission with complaints of melanotic stools.  Anemia panel consistent with a severe iron deficiency anemia with iron level of 12, ferritin of 10. FOBT negative.  Patient noted to have a colonoscopy 06/03/2016 with a large polyp that was removed via piecemeal as well as nonbleeding internal hemorrhoids.  Dr Grandville Silos discusses Case  with GI and patient status post upper endoscopy on  07/11/2018  showed 2 nonbleeding angioectasias in stomach and duodenum, duodenal angiodysplastic lesions treated with APC , Patient status post IV Feraheme.    3.  Hypokalemia/hypomagnesemia Replace and recheck  4.  Dysuria Urinalysis unremarkable.  Urine cultures negative.  Follow.  5.  Abdominal pain Patient with diffuse upper abdominal pain which she states is chronic in nature.  Patient states she is supposed to follow-up with a gastroenterologist in the outpatient setting.  Lipase levels within normal limits.  Tolerating current diet.  Continue PPI.  Follow.   6.  Polysubstance use/cocaine use Patient states she was clean for 5 years however relapsed recently and has used crack cocaine 3 times last use was the night prior to admission.  Cessation of polysubstance use stressed to patient.  Social work consult.  7.  Alcohol use Patient does endorse alcohol use.  Patient states she drinks as much as she can  get her hands on and states he drinks both beer and liquor.    Patient with no significant withdrawal symptoms.  Continue the Ativan withdrawal protocol, thiamine, multivitamin, folic acid.  Supportive care.    8.  Schizoaffective disorder Patient seen in consultation by psychiatry who are recommending inpatient psychiatric hospitalization given high risk of  harm to self.  Landscape architect.  Continue gabapentin, Celexa and Seroquel 150 mg nightly for mood stabilization/insomnia.  Per psychiatry patient refuses voluntary psychiatric hospitalization will need to pursue involuntary commitment.   9.  Chronic low back pain Patient states she is supposed to follow-up with her neurosurgeon.  Patient with Tylenol overdose and as such cannot placed on Tylenol as needed.  Patient with a history of peptic ulcer disease and now complaining of melanotic stools and as such can't placed on any NSAIDs.  Lidoderm patch was ordered however patient unable to tolerate and as such Lidoderm patch has been discontinued.  Ultram was also discontinued.  Patient placed on Neurontin, Sterapred, Voltaren gel with some clinical improvement.  Monitor for now.   10.  COPD/tobacco abuse. Stable.  Tobacco cessation.  Continue nicotine patch, Dulera and Spiriva.  Duo nebs as needed.  11.  History of seizure disorder Patient not on any anticonvulsant medications.    Discontinued Ultram.  Monitor for now.     #12 hypertension C/n  Norvasc to 10 mg daily.     DVT prophylaxis: SCDs Code Status: Full Family Communication: Updated patient.  No family at bedside. Disposition Plan: Inpatient psychiatry Patient is medically cleared as of 07/12/2018, please transfer to inpatient psychiatric service when bed available  Patient remains medically stable at this time     Consultants:   Gastroenterology: Dr. Havery Moros 07/10/2018  Psychiatry consultation: Dr. Mariea Clonts 07/10/2018.  Procedures:  Acute abdominal series 07/09/2018   Upper endoscopy per Dr. Havery Moros 07/11/2018  Antimicrobials:   None   Subjective: Resting comfortably, no new concerns, eating and drinking well.  Objective: Vitals:   07/12/18 0605 07/12/18 0640 07/12/18 0803 07/12/18 1226  BP: (!) 150/76   (!) 152/83  Pulse: 77   99  Resp: 18   20  Temp: 98.8 F (37.1 C)   99.1 F (37.3 C)  TempSrc: Oral    Oral  SpO2: 100%  99% 100%  Weight:  49.3 kg (108 lb 11 oz)    Height:        Intake/Output Summary (Last 24 hours) at 07/12/2018 1809 Last data filed at 07/11/2018 1830 Gross per 24 hour  Intake -  Output 450 ml  Net -450 ml   Filed Weights   07/10/18 0639 07/11/18 1040 07/12/18 0640  Weight: 48.6 kg (107 lb 2.3 oz) 48.6 kg (107 lb 2.3 oz) 49.3 kg (108 lb 11 oz)    Examination:  General exam: NAD Respiratory system: CTAB.  No wheezes, no crackles, no rhonchi.  Respiratory effort normal. Cardiovascular system: Regular rate and rhythm no murmurs rubs or gallops.  No JVD.  No lower extremity edema.   Gastrointestinal system: Abdomen is nondistended, soft and decreased tenderness to palpation in the epigastrium.  No organomegaly.  Positive bowel sounds.   Central nervous system: Alert and oriented. No focal neurological deficits. Extremities: Symmetric 5 x 5 power. Skin: No rashes, lesions or ulcers Psychiatry: Judgement and insight appear normal. Mood & affect appropriate.     Data Reviewed: I have personally reviewed following labs and imaging studies  CBC: Recent Labs  Lab 07/09/18 1516 07/10/18 0512 07/10/18  1446 07/11/18 0925  WBC 4.8 3.6*  --  11.5*  NEUTROABS  --   --   --  10.2*  HGB 9.8* 8.0* 8.7* 9.1*  HCT 30.3* 24.9* 27.4* 28.9*  MCV 89.1 90.5  --  92.6  PLT 245 175  --  989   Basic Metabolic Panel: Recent Labs  Lab 07/09/18 1516 07/09/18 1654 07/09/18 1845 07/10/18 0512 07/11/18 0925  NA 140  --   --  138 140  K 3.3*  --   --  3.0* 4.2  CL 105  --   --  110 105  CO2 25  --   --  20* 28  GLUCOSE 105*  --   --  120* 117*  BUN 10  --   --  <5* 11  CREATININE 1.39*  --   --  0.71 0.72  CALCIUM 9.9  --   --  8.4* 10.5*  MG  --  1.5*  --  2.1 1.9  PHOS  --   --  2.9  --   --    GFR: Estimated Creatinine Clearance: 58.9 mL/min (by C-G formula based on SCr of 0.72 mg/dL). Liver Function Tests: Recent Labs  Lab 07/09/18 1516 07/10/18 1446  07/11/18 0925  AST 73* 73* 62*  ALT 39 39 41  ALKPHOS 74 92 76  BILITOT 0.7 0.6 0.8  PROT 8.7* 7.7 8.1  ALBUMIN 4.4 3.6 4.2   Recent Labs  Lab 07/09/18 1516  LIPASE 40   No results for input(s): AMMONIA in the last 168 hours. Coagulation Profile: Recent Labs  Lab 07/09/18 1516 07/10/18 1446 07/11/18 0925  INR 0.97 1.02 1.04   Cardiac Enzymes: No results for input(s): CKTOTAL, CKMB, CKMBINDEX, TROPONINI in the last 168 hours. BNP (last 3 results) No results for input(s): PROBNP in the last 8760 hours. HbA1C: No results for input(s): HGBA1C in the last 72 hours. CBG: Recent Labs  Lab 07/09/18 1509 07/10/18 0734 07/11/18 0749 07/12/18 0751  GLUCAP 112* 233* 121* 103*   Lipid Profile: No results for input(s): CHOL, HDL, LDLCALC, TRIG, CHOLHDL, LDLDIRECT in the last 72 hours. Thyroid Function Tests: No results for input(s): TSH, T4TOTAL, FREET4, T3FREE, THYROIDAB in the last 72 hours. Anemia Panel: Recent Labs    07/09/18 1845  VITAMINB12 602  FOLATE 16.2  FERRITIN 10*  TIBC 593*  IRON 12*  RETICCTPCT 1.1   Sepsis Labs: No results for input(s): PROCALCITON, LATICACIDVEN in the last 168 hours.  Recent Results (from the past 240 hour(s))  Urine Culture     Status: None   Collection Time: 07/05/18  4:14 PM  Result Value Ref Range Status   Urine Culture, Routine Final report  Final   Organism ID, Bacteria Comment  Final    Comment: Mixed urogenital flora Less than 10,000 colonies/mL   Culture, Urine     Status: None   Collection Time: 07/09/18  3:11 PM  Result Value Ref Range Status   Specimen Description   Final    URINE, RANDOM Performed at [facility], [place] [address] [place], [place] [address]    Special Requests   Final    NONE Performed at [facility], [place] [address], [place], [place] [address]    Culture   Final    NO GROWTH Performed at [facility], [place] [address] [place], [place] [address]      Report Status 07/11/2018 FINAL  Final  Radiology Studies: No results found.      Scheduled Meds: . amLODipine  10 mg Oral Daily  . citalopram  10 mg Oral Daily  . diclofenac sodium  2 g Topical QID  . ferrous sulfate  325 mg Oral BID WC  . folic acid  1 mg Oral Daily  . gabapentin  400 mg Oral TID  . LORazepam  0-4 mg Intravenous Q12H  . mometasone-formoterol  2 puff Inhalation BID  . multivitamin with minerals  1 tablet Oral Daily  . nicotine  14 mg Transdermal Q24H  . pantoprazole  40 mg Oral BID AC  . predniSONE  10 mg Oral 4X daily taper  . QUEtiapine  150 mg Oral QHS  . senna-docusate  1 tablet Oral BID  . thiamine  100 mg Oral Daily   Or  . thiamine  100 mg Intravenous Daily   Continuous Infusions:    LOS: 3 days    Time spent: 40 mins    Roxan Hockey, MD [facility] Pager [phone]  If 7PM-7AM, please contact night-coverage [email or web address] Password [facility] 07/12/2018, 6:09 PM

## 2018-07-12 NOTE — Addendum Note (Signed)
Addendum  created 07/12/18 0747 by Lollie Sails, CRNA   Charge Capture section accepted, Visit diagnoses modified

## 2018-07-12 NOTE — Progress Notes (Signed)
LCSW faxed patient out to inpatient psych facilities.   Patient requires gero- psych placement.   LCSW will follow for dc needs.   Melissa Bridges

## 2018-07-12 NOTE — Progress Notes (Signed)
Patient is medically cleared as of 07/12/2018, please transfer to inpatient psychiatric service when bed available  Patient remains medically stable at this time  Roxan Hockey, MD

## 2018-07-13 ENCOUNTER — Other Ambulatory Visit: Payer: Self-pay

## 2018-07-13 DIAGNOSIS — D649 Anemia, unspecified: Secondary | ICD-10-CM

## 2018-07-13 LAB — GLUCOSE, CAPILLARY: GLUCOSE-CAPILLARY: 119 mg/dL — AB (ref 70–99)

## 2018-07-13 MED ORDER — METHOCARBAMOL 500 MG PO TABS
1000.0000 mg | ORAL_TABLET | Freq: Three times a day (TID) | ORAL | Status: DC
Start: 2018-07-13 — End: 2018-07-15
  Administered 2018-07-13 – 2018-07-15 (×6): 1000 mg via ORAL
  Filled 2018-07-13 (×6): qty 2

## 2018-07-13 MED ORDER — DIPHENHYDRAMINE HCL 25 MG PO CAPS
25.0000 mg | ORAL_CAPSULE | Freq: Four times a day (QID) | ORAL | Status: DC | PRN
  Administered 2018-07-13 – 2018-07-14 (×2): 25 mg via ORAL
  Filled 2018-07-13 (×2): qty 1

## 2018-07-13 MED ORDER — HYDRALAZINE HCL 20 MG/ML IJ SOLN: 10.0000 mg | Freq: Four times a day (QID) | INTRAMUSCULAR | Status: DC | PRN

## 2018-07-13 NOTE — Progress Notes (Signed)
Cbc due in 3 weeks for anemia. See result note.

## 2018-07-13 NOTE — Progress Notes (Signed)
PROGRESS NOTE    Melissa Bridges  MCN:[id number] DOB: Jan 31, 1961 DOA: 07/09/2018 PCP: Forrest Moron, MD    Brief Narrative: Patient is a pleasant 57 year old lady history of polysubstance abuse including crack cocaine, COPD, history of seizures, hep C, alcohol use, schizoaffective disorder, chronic lumbar disc herniation presented to the ED with a Tylenol overdose.  Patient also noted to be anemic with a severe iron deficiency anemia.  Patient pancultured.  Patient placed on Mucomyst and poison control contacted.  Patient is medically cleared as of 07/12/2018, please transfer to inpatient psychiatric service when bed available  Patient remains medically stable at this time     Assessment & Plan:   Principal Problem:   Schizoaffective disorder, bipolar type ([place]) Active Problems:   Smoker   Chronic hepatitis C without hepatic coma (HCC)   Hypertension   Lower back pain   Seizure disorder (HCC)   Lumbar disc herniation   Hypokalemia   Tylenol overdose   Abdominal pain   COPD (chronic obstructive pulmonary disease) (HCC)   Cocaine abuse ([place])   Hypomagnesemia   Chronic left hip pain   Suicidal ideation   Anemia   Overdose   Melena  #1 Tylenol overdose/suicidal ideation Patient presented with a Tylenol overdose stated that she had significant back pain which was not as relieved with her crack cocaine and also had suicidal ideation as her grand daughter had told her she could not attend her graduation due to her history of polysubstance use.  Patient was placed on Mucomyst.  Repeat Tylenol level less than 10.  Tylenol level on admission was 131.  LFTs with no significant change.  INR at 1.04.  Discontinued Mucomyst per recommendations from poison control.  Supportive care.  Patient has been seen by psychiatry who are recommending inpatient psychiatric admission and if patient refuses voluntary admission patient will need to be IVC.    Patient is medically cleared as of  07/12/2018, please transfer to inpatient psychiatric service when bed available  Patient remains medically stable at this time    2.  H/o Chronic  iron deficiency anemia Patient noted to be anemic on admission with a hemoglobin of 9.8.  Hgb > 9,   Patient on admission with complaints of melanotic stools.  Anemia panel consistent with a severe iron deficiency anemia with iron level of 12, ferritin of 10. FOBT negative.  Patient noted to have a colonoscopy 06/03/2016 with a large polyp that was removed via piecemeal as well as nonbleeding internal hemorrhoids.  Dr Grandville Silos discusses Case  with GI and patient status post upper endoscopy on  07/11/2018  showed 2 nonbleeding angioectasias in stomach and duodenum, duodenal angiodysplastic lesions treated with APC , Patient status post IV Feraheme.  C/n to monitor closely,  transfuse as needed  3.  Hypokalemia/hypomagnesemia Replace and recheck  4.  Dysuria Urinalysis unremarkable.  Urine cultures negative.  Follow.  5.  Abdominal pain Patient with diffuse upper abdominal pain which she states is chronic in nature.  Patient states she is supposed to follow-up with a gastroenterologist in the outpatient setting.  Lipase levels within normal limits.  Tolerating current diet.  Continue PPI.  Follow.   6.  Polysubstance use/cocaine use Patient states she was clean for 5 years however relapsed recently and has used crack cocaine 3 times last use was the night prior to admission.  Cessation of polysubstance use stressed to patient.  Social work consult.  7.  Alcohol use Patient does endorse alcohol use.  Patient states she drinks as much as she can get her hands on and states he drinks both beer and liquor.    Patient with no significant withdrawal symptoms.  Continue the Ativan withdrawal protocol, thiamine, multivitamin, folic acid.  Supportive care.    8.  Schizoaffective disorder Patient seen in consultation by psychiatry who are recommending  inpatient psychiatric hospitalization given high risk of harm to self.  Landscape architect.  Continue gabapentin, Celexa and Seroquel 150 mg nightly for mood stabilization/insomnia.  Per psychiatry patient refuses voluntary psychiatric hospitalization will need to pursue involuntary commitment.  Patient is medically cleared as of 07/12/2018, please transfer to inpatient psychiatric service when bed available Patient remains medically stable at this time   9.  Chronic low back pain- Patient states she is supposed to follow-up with her neurosurgeon.  Patient with Tylenol overdose and as such cannot placed on Tylenol as needed.  Patient with a history of peptic ulcer disease and now complaining of melanotic stools and as such can't placed on any NSAIDs.  Lidoderm patch was ordered however patient unable to tolerate and as such Lidoderm patch has been discontinued.  Ultram was also discontinued.  Patient placed on Neurontin, Sterapred, Voltaren gel with some clinical improvement.  Monitor for now.  Give methocarbamol 1000 3 times daily  10.  COPD/tobacco abuse. Stable.    No acute exacerbation at this time, tobacco cessation advised.  Continue nicotine patch, Dulera and Spiriva.  Duo nebs as needed.  11.  History of seizure disorder Patient not on any anticonvulsant medications.    Discontinued Ultram.  Monitor for now.     #12 hypertension-improved with Norvasc 10 mg daily, may use hydralazine IV as needed BP    DVT prophylaxis: SCDs Code Status: Full Family Communication: Updated patient.  No family at bedside. Disposition Plan:    PT and OT recommends skilled nursing facility, patient is to ambulate with a walker, patient is to go to the psychogeriatric unit .  inpatient psychiatry Patient is medically cleared as of 07/12/2018, please transfer to inpatient psychiatric service when bed available  Patient remains medically stable at this time   Consultants:   Gastroenterology: Dr. Havery Moros  07/10/2018  Psychiatry consultation: Dr. Mariea Clonts 07/10/2018.  Procedures:  Acute abdominal series 07/09/2018   Upper endoscopy per Dr. Havery Moros 07/11/2018  Antimicrobials:   None   Subjective: Resting comfortably, no new concerns, eating and drinking well, complains of back pain with ambulation and weightbearing  Objective: Vitals:   07/12/18 1923 07/12/18 1947 07/13/18 0500 07/13/18 0524  BP: (!) 155/84   123/84  Pulse: (!) 105   84  Resp:    18  Temp:    98.1 F (36.7 C)  TempSrc: Oral     SpO2: 99% 97%  100%  Weight:   49.8 kg (109 lb 12.6 oz)   Height:        Intake/Output Summary (Last 24 hours) at 07/13/2018 1720 Last data filed at 07/13/2018 1230 Gross per 24 hour  Intake 480 ml  Output -  Net 480 ml   Filed Weights   07/11/18 1040 07/12/18 0640 07/13/18 0500  Weight: 48.6 kg (107 lb 2.3 oz) 49.3 kg (108 lb 11 oz) 49.8 kg (109 lb 12.6 oz)    Examination:  General exam: NAD Respiratory system: CTAB.  No wheezes, no crackles, no rhonchi.  Respiratory effort normal. Cardiovascular system: Regular rate and rhythm no murmurs rubs or gallops.  No JVD.  No lower extremity edema.  Gastrointestinal system: Abdomen is nondistended, soft and decreased tenderness to palpation in the epigastrium.  No organomegaly.  Positive bowel sounds.   Central nervous system: Alert and oriented. No focal neurological deficits. Extremities: Symmetric 5 x 5 power. Skin: No rashes, lesions or ulcers Psychiatry: Judgement and insight appear normal. Mood & affect appropriate.  Back/MSK--low back pain with range of motion with ambulation  CBC: Recent Labs  Lab 07/09/18 1516 07/10/18 0512 07/10/18 1446 07/11/18 0925  WBC 4.8 3.6*  --  11.5*  NEUTROABS  --   --   --  10.2*  HGB 9.8* 8.0* 8.7* 9.1*  HCT 30.3* 24.9* 27.4* 28.9*  MCV 89.1 90.5  --  92.6  PLT 245 175  --  294   Basic Metabolic Panel: Recent Labs  Lab 07/09/18 1516 07/09/18 1654 07/09/18 1845 07/10/18 0512  07/11/18 0925  NA 140  --   --  138 140  K 3.3*  --   --  3.0* 4.2  CL 105  --   --  110 105  CO2 25  --   --  20* 28  GLUCOSE 105*  --   --  120* 117*  BUN 10  --   --  <5* 11  CREATININE 1.39*  --   --  0.71 0.72  CALCIUM 9.9  --   --  8.4* 10.5*  MG  --  1.5*  --  2.1 1.9  PHOS  --   --  2.9  --   --    GFR: Estimated Creatinine Clearance: 59.5 mL/min (by C-G formula based on SCr of 0.72 mg/dL). Liver Function Tests: Recent Labs  Lab 07/09/18 1516 07/10/18 1446 07/11/18 0925  AST 73* 73* 62*  ALT 39 39 41  ALKPHOS 74 92 76  BILITOT 0.7 0.6 0.8  PROT 8.7* 7.7 8.1  ALBUMIN 4.4 3.6 4.2   Recent Labs  Lab 07/09/18 1516  LIPASE 40   No results for input(s): AMMONIA in the last 168 hours. Coagulation Profile: Recent Labs  Lab 07/09/18 1516 07/10/18 1446 07/11/18 0925  INR 0.97 1.02 1.04   Cardiac Enzymes: No results for input(s): CKTOTAL, CKMB, CKMBINDEX, TROPONINI in the last 168 hours. BNP (last 3 results) No results for input(s): PROBNP in the last 8760 hours. HbA1C: No results for input(s): HGBA1C in the last 72 hours. CBG: Recent Labs  Lab 07/09/18 1509 07/10/18 0734 07/11/18 0749 07/12/18 0751 07/13/18 0756  GLUCAP 112* 233* 121* 103* 119*   Lipid Profile: No results for input(s): CHOL, HDL, LDLCALC, TRIG, CHOLHDL, LDLDIRECT in the last 72 hours. Thyroid Function Tests: No results for input(s): TSH, T4TOTAL, FREET4, T3FREE, THYROIDAB in the last 72 hours. Anemia Panel: No results for input(s): VITAMINB12, FOLATE, FERRITIN, TIBC, IRON, RETICCTPCT in the last 72 hours. Sepsis Labs: No results for input(s): PROCALCITON, LATICACIDVEN in the last 168 hours.  Recent Results (from the past 240 hour(s))  Urine Culture     Status: None   Collection Time: 07/05/18  4:14 PM  Result Value Ref Range Status   Urine Culture, Routine Final report  Final   Organism ID, Bacteria Comment  Final    Comment: Mixed urogenital flora Less than 10,000 colonies/mL    Culture, Urine     Status: None   Collection Time: 07/09/18  3:11 PM  Result Value Ref Range Status   Specimen Description   Final    URINE, RANDOM Performed at [facility], [place] [address], [place], [place] [address]  Special Requests   Final    NONE Performed at [facility], [place] [address] [place], [place] [address]    Culture   Final    NO GROWTH Performed at [facility], [place] [address] [place], [place] [address]    Report Status 07/11/2018 FINAL  Final     Radiology Studies: No results found.   Scheduled Meds: . amLODipine  10 mg Oral Daily  . citalopram  10 mg Oral Daily  . diclofenac sodium  2 g Topical QID  . ferrous sulfate  325 mg Oral BID WC  . folic acid  1 mg Oral Daily  . gabapentin  400 mg Oral TID  . mometasone-formoterol  2 puff Inhalation BID  . multivitamin with minerals  1 tablet Oral Daily  . nicotine  14 mg Transdermal Q24H  . pantoprazole  40 mg Oral BID AC  . predniSONE  10 mg Oral 4X daily taper  . QUEtiapine  150 mg Oral QHS  . senna-docusate  1 tablet Oral BID  . thiamine  100 mg Oral Daily   Or  . thiamine  100 mg Intravenous Daily    LOS: 4 days    Roxan Hockey, MD [facility] Pager [phone]  If 7PM-7AM, please contact night-coverage [email or web address] Password [email or web address] 07/13/2018, 5:20 PM

## 2018-07-13 NOTE — Progress Notes (Signed)
Physical Therapy Treatment Patient Details Name: Melissa Bridges MRN: [id number] DOB: 27-Nov-1961 Today's Date: 07/13/2018    History of Present Illness  Melissa Bridges is a 57 y.o. female with medical history significant of polysubstance abuse including crack cocaine, COPD, history of seizures, hepatitis C, schizoaffective disorder, lumbar disc herniation, chronic left hip-midback-left shoulder pain presented to the ED with Tylenol overdose with intent to kill self    PT Comments    Pt in bed with safety sitter in room.  Assisted OOB to amb in hallway required need for RW due to back pain.    Follow Up Recommendations  SNF     Equipment Recommendations  Rolling walker with 5" wheels;Other (comment)    Recommendations for Other Services       Precautions / Restrictions Precautions Precautions: Fall Restrictions Weight Bearing Restrictions: No    Mobility  Bed Mobility Overal bed mobility: Needs Assistance Bed Mobility: Supine to Sit;Sit to Supine     Supine to sit: Supervision Sit to supine: Supervision      Transfers Overall transfer level: Needs assistance Equipment used: None Transfers: Sit to/from Stand Sit to Stand: Supervision;Min guard         General transfer comment: VC's safety  Ambulation/Gait Ambulation/Gait assistance: Min guard Gait Distance (Feet): 75 Feet Assistive device: Rolling walker (2 wheeled) Gait Pattern/deviations: Step-to pattern;Decreased step length - right;Decreased stance time - left;Decreased stride length;Decreased dorsiflexion - left;Decreased weight shift to left;Antalgic;Drifts right/left;Narrow base of support;Trunk flexed Gait velocity: decreased   General Gait Details: very unsteady gait with little to no self WBing thru L LE due to pain.  Impulsive.  jerky.  HIGH FALL RISK.   Stairs             Wheelchair Mobility    Modified Rankin (Stroke Patients Only)       Balance                                             Cognition Arousal/Alertness: Awake/alert Behavior During Therapy: Impulsive Overall Cognitive Status: No family/caregiver present to determine baseline cognitive functioning                                        Exercises      General Comments        Pertinent Vitals/Pain Pain Assessment: Faces Pain Score: 8  Faces Pain Scale: Hurts little more Pain Location: back Pain Descriptors / Indicators: Aching;Sore Pain Intervention(s): Heat applied    Home Living                      Prior Function            PT Goals (current goals can now be found in the care plan section) Progress towards PT goals: Progressing toward goals    Frequency    Min 2X/week      PT Plan Current plan remains appropriate    Co-evaluation              AM-PAC PT "6 Clicks" Daily Activity  Outcome Measure  Difficulty turning over in bed (including adjusting bedclothes, sheets and blankets)?: None Difficulty moving from lying on back to sitting on the side of the bed? : None Difficulty sitting down on and standing  up from a chair with arms (e.g., wheelchair, bedside commode, etc,.)?: A Little Help needed moving to and from a bed to chair (including a wheelchair)?: A Little Help needed walking in hospital room?: A Little Help needed climbing 3-5 steps with a railing? : A Lot 6 Click Score: 19    End of Session Equipment Utilized During Treatment: Gait belt Activity Tolerance: Patient tolerated treatment well Patient left: in bed;with call bell/phone within reach;with nursing/sitter in room Nurse Communication: Mobility status PT Visit Diagnosis: Unsteadiness on feet (R26.81);Muscle weakness (generalized) (M62.81);Difficulty in walking, not elsewhere classified (R26.2)     Time: 7371-0626 PT Time Calculation (min) (ACUTE ONLY): 11 min  Charges:  $Gait Training: 8-22 mins                     Rica Koyanagi  PTA [facility]  Acute   Rehab Pager      [phone]

## 2018-07-13 NOTE — Progress Notes (Signed)
Occupational Therapy Treatment Patient Details Name: Melissa Bridges MRN: [id number] DOB: May 21, 1961 Today's Date: 07/13/2018    History of present illness  Melissa Bridges is a 57 y.o. female with medical history significant of polysubstance abuse including crack cocaine, COPD, history of seizures, hepatitis C, schizoaffective disorder, lumbar disc herniation, chronic left hip-midback-left shoulder pain presented to the ED with Tylenol overdose with intent to kill self   OT comments  Reviewed sequence and adaptations for adls to decrease pain.  Pt stood without RW as she reports she doesn't use this consistently. Needs this for increased safety   Follow Up Recommendations  SNF;Supervision/Assistance - 24 hour    Equipment Recommendations  None recommended by OT    Recommendations for Other Services      Precautions / Restrictions Precautions Precautions: Fall Restrictions Weight Bearing Restrictions: No       Mobility Bed Mobility         Supine to sit: Supervision Sit to supine: Supervision      Transfers   Equipment used: None   Sit to Stand: Min assist         General transfer comment: pt reports she hasn't been using RW all the time; needs min A without. Recommend continued use of RW for increased safety    Balance                                           ADL either performed or assessed with clinical judgement   ADL           Upper Body Bathing: Set up;Supervision/ safety;Sitting   Lower Body Bathing: Minimal assistance;Sit to/from stand   Upper Body Dressing : Set up;Supervision/safety;Sitting   Lower Body Dressing: Minimal assistance;Sit to/from stand                 General ADL Comments: pt cannot cross LLE over R and this aggravates back. Educated on using kitchen tongs to assist reach for bathing and dressing. She has enough range for UB adls. Reviewed sequence. Min A ambulating in room; safer with RW     Vision        Perception     Praxis      Cognition Arousal/Alertness: Awake/alert Behavior During Therapy: Impulsive Overall Cognitive Status: No family/caregiver present to determine baseline cognitive functioning                                          Exercises     Shoulder Instructions       General Comments      Pertinent Vitals/ Pain       Pain Score: 8  Pain Location: back mostly, also L shoulder and hip to a lesser degree Pain Descriptors / Indicators: Aching;Sore Pain Intervention(s): Limited activity within patient's tolerance;Monitored during session;Repositioned  Home Living                                          Prior Functioning/Environment              Frequency  Min 2X/week        Progress Toward Goals  OT Goals(current goals can now be found in  the care plan section)  Progress towards OT goals: Progressing toward goals     Plan      Co-evaluation                 AM-PAC PT "6 Clicks" Daily Activity     Outcome Measure   Help from another person eating meals?: None Help from another person taking care of personal grooming?: A Little Help from another person toileting, which includes using toliet, bedpan, or urinal?: A Little Help from another person bathing (including washing, rinsing, drying)?: A Little Help from another person to put on and taking off regular upper body clothing?: A Little Help from another person to put on and taking off regular lower body clothing?: A Little 6 Click Score: 19    End of Session    OT Visit Diagnosis: Unsteadiness on feet (R26.81);Repeated falls (R29.6);History of falling (Z91.81);Pain Pain - Right/Left: Left Pain - part of body: Shoulder;Hip   Activity Tolerance Patient tolerated treatment well   Patient Left in bed;with call bell/phone within reach;with nursing/sitter in room   Nurse Communication          Time: 6962-9528 OT Time Calculation  (min): 13 min  Charges: OT General Charges $OT Visit: 1 Visit OT Treatments $Self Care/Home Management : 8-22 mins  Marica Otter, OTR/L [phone] 07/13/2018   Melissa Bridges 07/13/2018, 11:22 AM

## 2018-07-14 LAB — COMPREHENSIVE METABOLIC PANEL WITH GFR
ALT: 78 U/L — ABNORMAL HIGH (ref 0–44)
AST: 95 U/L — ABNORMAL HIGH (ref 15–41)
Albumin: 3.5 g/dL (ref 3.5–5.0)
Alkaline Phosphatase: 101 U/L (ref 38–126)
Anion gap: 6 (ref 5–15)
BUN: 13 mg/dL (ref 6–20)
CO2: 33 mmol/L — ABNORMAL HIGH (ref 22–32)
Calcium: 9.7 mg/dL (ref 8.9–10.3)
Chloride: 101 mmol/L (ref 98–111)
Creatinine, Ser: 0.75 mg/dL (ref 0.44–1.00)
GFR calc Af Amer: >60 mL/min
GFR calc non Af Amer: >60 mL/min
Glucose, Bld: 106 mg/dL — ABNORMAL HIGH (ref 70–99)
Potassium: 4 mmol/L (ref 3.5–5.1)
Sodium: 140 mmol/L (ref 135–145)
Total Bilirubin: 0.4 mg/dL (ref 0.3–1.2)
Total Protein: 7.5 g/dL (ref 6.5–8.1)

## 2018-07-14 LAB — CBC
HCT: 29.4 % — ABNORMAL LOW (ref 36.0–46.0)
Hemoglobin: 9.1 g/dL — ABNORMAL LOW (ref 12.0–15.0)
MCH: 30.3 pg (ref 26.0–34.0)
MCHC: 31 g/dL (ref 30.0–36.0)
MCV: 98 fL (ref 78.0–100.0)
PLATELETS: 249 10*3/uL (ref 150–400)
RBC: 3 MIL/uL — AB (ref 3.87–5.11)
RDW: 23.6 % — ABNORMAL HIGH (ref 11.5–15.5)
WBC: 11.9 10*3/uL — AB (ref 4.0–10.5)

## 2018-07-14 LAB — GLUCOSE, CAPILLARY: Glucose-Capillary: 98 mg/dL (ref 70–99)

## 2018-07-14 MED ORDER — OXYCODONE HCL 5 MG PO TABS
5.0000 mg | ORAL_TABLET | ORAL | Status: DC | PRN
  Administered 2018-07-14 (×3): 5 mg via ORAL
  Filled 2018-07-14 (×3): qty 1

## 2018-07-14 NOTE — Progress Notes (Signed)
LCSW following for inpatient psych placement.   Patient requires gero psych due to SNF recommendation from PT/OT and recommended equipment.   Patient positive for cocaine on admission which is a barrier to [employer].   LCSW initiated [facility] referral.  LCSW paged attending for new psych consult, initial is over 72 hours.   LCSW will continue to follow for DC needs.    Melissa Bridges

## 2018-07-14 NOTE — Progress Notes (Signed)
PROGRESS NOTE    Melissa Bridges  FBX:[id number] DOB: 1961/02/19 DOA: 07/09/2018 PCP: Forrest Moron, MD    Brief Narrative: Patient is a pleasant 57 year old lady history of polysubstance abuse including crack cocaine, COPD, history of seizures, hep C, alcohol use, schizoaffective disorder, chronic lumbar disc herniation presented to the ED with a Tylenol overdose.  Patient also noted to be anemic with a severe iron deficiency anemia.  Patient pancultured.  Patient placed on Mucomyst and poison control contacted. UDS was positive for cocaine   Patient has been cleared by psychiatrist upon reevaluation on 07/14/18,  patient no longer needs inpatient psychiatric treatment, awaiting transfer to skilled nursing facility     Assessment & Plan:   Principal Problem:   Schizoaffective disorder, bipolar type ([place]) Active Problems:   Smoker   Chronic hepatitis C without hepatic coma (HCC)   Hypertension   Lower back pain   Seizure disorder (HCC)   Lumbar disc herniation   Hypokalemia   Tylenol overdose   Abdominal pain   COPD (chronic obstructive pulmonary disease) ([facility])   Cocaine abuse ([place])   Hypomagnesemia   Chronic left hip pain   Suicidal ideation   Anemia   Overdose   [place]  #1 Tylenol overdose/suicidal ideation Patient presented with a Tylenol overdose stated that she had significant back pain which was not as relieved with her crack cocaine and also had suicidal ideation as her grand daughter had told her she could not attend her graduation due to her history of polysubstance use.  Patient was placed on Mucomyst.  Repeat Tylenol level less than 10.  Tylenol level on admission was 131.  LFTs with no significant change.  INR at 1.04.  Discontinued Mucomyst per recommendations from poison control.  Supportive care.    Patient has been cleared by psychiatrist upon reevaluation on 07/14/18,  patient no longer needs inpatient psychiatric treatment, awaiting transfer to skilled  nursing facility Continue Gabapentin 400 mg TID for pain/anxiety, Celexa 10 mg daily for depression and anxiety and Seroquel 150 mg qhs for mood stabilization/insomnia.  .    2.  H/o Chronic  iron deficiency anemia Patient noted to be anemic on admission with a hemoglobin of 9.8.  Hgb > 9,   Patient on admission with complaints of melanotic stools.  Anemia panel consistent with a severe iron deficiency anemia with iron level of 12, ferritin of 10. FOBT negative.  Patient noted to have a colonoscopy 06/03/2016 with a large polyp that was removed via piecemeal as well as nonbleeding internal hemorrhoids.  Dr Grandville Silos discusses Case  with GI and patient status post upper endoscopy on  07/11/2018  showed 2 nonbleeding angioectasias in stomach and duodenum, duodenal angiodysplastic lesions treated with APC , Patient status post IV Feraheme.  C/n to monitor closely,  transfuse as needed  3.  Hypokalemia/hypomagnesemia Replace and recheck  4.  Dysuria Urinalysis unremarkable.  Urine cultures negative.  Follow.  5.  Abdominal pain Patient with diffuse upper abdominal pain which she states is chronic in nature.  Patient states she is supposed to follow-up with a gastroenterologist in the outpatient setting.  Lipase levels within normal limits.  Tolerating current diet.  Continue PPI.  Follow.   6.  Polysubstance use/cocaine use Patient states she was clean for 5 years however relapsed recently and has used crack cocaine 3 times last use was the night prior to admission.    UDS was positive for cocaine on admission, cessation of polysubstance use stressed to patient.  Social work consult.  7.  Alcohol use Patient does endorse alcohol use.  Patient states she drinks as much as she can get her hands on and states he drinks both beer and liquor.    Patient with no significant withdrawal symptoms.  Continue the Ativan withdrawal protocol, thiamine, multivitamin, folic acid.  Supportive care.    8.   Schizoaffective disorder Patient seen in consultation by psychiatry who are recommending inpatient psychiatric hospitalization given high risk of harm to self.  Landscape architect.  Continue gabapentin, Celexa and Seroquel 150 mg nightly for mood stabilization/insomnia.    Patient has been cleared by psychiatrist upon reevaluation on 07/14/18,  patient no longer needs inpatient psychiatric treatment, awaiting transfer to skilled nursing facility Continue Gabapentin 400 mg TID for pain/anxiety, Celexa 10 mg daily for depression and anxiety and Seroquel 150 mg qhs for mood stabilization/insomnia.  9.  Chronic low back pain- Patient states she is supposed to follow-up with her neurosurgeon.  Patient with Tylenol overdose and as such cannot placed on Tylenol as needed.  Patient with a history of peptic ulcer disease and now complaining of melanotic stools and as such can't placed on any NSAIDs.  Lidoderm patch was ordered however patient unable to tolerate and as such Lidoderm patch has been discontinued.  Ultram was also discontinued.  Patient placed on Neurontin, Sterapred, Voltaren gel with some clinical improvement.  Monitor for now.  C/n  methocarbamol 1000 3 times daily  10.  COPD/tobacco abuse. Stable.    No acute exacerbation at this time, tobacco cessation advised.  Continue nicotine patch, Dulera and Spiriva.  Duo nebs as needed.  11.  History of seizure disorder Patient not on any anticonvulsant medications.    Discontinued Ultram.  Monitor for now.     #12 hypertension-improved with Norvasc 10 mg daily, may use hydralazine IV as needed BP    DVT prophylaxis: SCDs Code Status: Full Family Communication: Updated patient.  No family at bedside. Disposition Plan:    PT and OT recommends skilled nursing facility, patient is to ambulate with a walker, Patient has been cleared by psychiatrist upon reevaluation on 07/14/18,  patient no longer needs inpatient psychiatric treatment, awaiting transfer  to skilled nursing facility   Consultants:   Gastroenterology: Dr. Havery Moros 07/10/2018  Psychiatry consultation: Dr. Mariea Clonts 07/10/2018.  Procedures:  Acute abdominal series 07/09/2018   Upper endoscopy per Dr. Havery Moros 07/11/2018  Antimicrobials:   None   Subjective: Resting comfortably, no new concerns, eating and drinking well, ambulating with walker  Objective: Vitals:   07/13/18 1935 07/14/18 0540 07/14/18 1110 07/14/18 1352  BP: 137/76 126/62 (!) 145/92 (!) 165/87  Pulse: 90 75 88 91  Resp: 18 17  18   Temp: 98.6 F (37 C) (!) 97.5 F (36.4 C) 98.2 F (36.8 C) 98.3 F (36.8 C)  TempSrc:   Axillary Oral  SpO2: 98% 100% 98% 98%  Weight:  48.6 kg (107 lb 2.3 oz)    Height:        Intake/Output Summary (Last 24 hours) at 07/14/2018 1735 Last data filed at 07/14/2018 1300 Gross per 24 hour  Intake 600 ml  Output -  Net 600 ml   Filed Weights   07/12/18 0640 07/13/18 0500 07/14/18 0540  Weight: 49.3 kg (108 lb 11 oz) 49.8 kg (109 lb 12.6 oz) 48.6 kg (107 lb 2.3 oz)    Examination:  General exam: NAD Respiratory system: CTAB.  No wheezes, no crackles, no rhonchi.  Respiratory effort normal. Cardiovascular  system: Regular rate and rhythm no murmurs rubs or gallops.  No JVD.  No lower extremity edema.   Gastrointestinal system: Abdomen is nondistended, soft and decreased tenderness to palpation in the epigastrium.  No organomegaly.  Positive bowel sounds.   Central nervous system: Alert and oriented. No focal neurological deficits. Extremities: Symmetric 5 x 5 power. Skin: No rashes, lesions or ulcers Psychiatry: Judgement and insight appear normal. Mood & affect appropriate.  Back/MSK--low back pain with range of motion with ambulation  CBC: Recent Labs  Lab 07/09/18 1516 07/10/18 0512 07/10/18 1446 07/11/18 0925 07/14/18 0503  WBC 4.8 3.6*  --  11.5* 11.9*  NEUTROABS  --   --   --  10.2*  --   HGB 9.8* 8.0* 8.7* 9.1* 9.1*  HCT 30.3* 24.9* 27.4* 28.9*  29.4*  MCV 89.1 90.5  --  92.6 98.0  PLT 245 175  --  201 308   Basic Metabolic Panel: Recent Labs  Lab 07/09/18 1516 07/09/18 1654 07/09/18 1845 07/10/18 0512 07/11/18 0925 07/14/18 0503  NA 140  --   --  138 140 140  K 3.3*  --   --  3.0* 4.2 4.0  CL 105  --   --  110 105 101  CO2 25  --   --  20* 28 33*  GLUCOSE 105*  --   --  120* 117* 106*  BUN 10  --   --  <5* 11 13  CREATININE 1.39*  --   --  0.71 0.72 0.75  CALCIUM 9.9  --   --  8.4* 10.5* 9.7  MG  --  1.5*  --  2.1 1.9  --   PHOS  --   --  2.9  --   --   --    GFR: Estimated Creatinine Clearance: 58.1 mL/min (by C-G formula based on SCr of 0.75 mg/dL). Liver Function Tests: Recent Labs  Lab 07/09/18 1516 07/10/18 1446 07/11/18 0925 07/14/18 0503  AST 73* 73* 62* 95*  ALT 39 39 41 78*  ALKPHOS 74 92 76 101  BILITOT 0.7 0.6 0.8 0.4  PROT 8.7* 7.7 8.1 7.5  ALBUMIN 4.4 3.6 4.2 3.5   Recent Labs  Lab 07/09/18 1516  LIPASE 40   No results for input(s): AMMONIA in the last 168 hours. Coagulation Profile: Recent Labs  Lab 07/09/18 1516 07/10/18 1446 07/11/18 0925  INR 0.97 1.02 1.04   Cardiac Enzymes: No results for input(s): CKTOTAL, CKMB, CKMBINDEX, TROPONINI in the last 168 hours. BNP (last 3 results) No results for input(s): PROBNP in the last 8760 hours. HbA1C: No results for input(s): HGBA1C in the last 72 hours. CBG: Recent Labs  Lab 07/10/18 0734 07/11/18 0749 07/12/18 0751 07/13/18 0756 07/14/18 0504  GLUCAP 233* 121* 103* 119* 98   Lipid Profile: No results for input(s): CHOL, HDL, LDLCALC, TRIG, CHOLHDL, LDLDIRECT in the last 72 hours. Thyroid Function Tests: No results for input(s): TSH, T4TOTAL, FREET4, T3FREE, THYROIDAB in the last 72 hours. Anemia Panel: No results for input(s): VITAMINB12, FOLATE, FERRITIN, TIBC, IRON, RETICCTPCT in the last 72 hours. Sepsis Labs: No results for input(s): PROCALCITON, LATICACIDVEN in the last 168 hours.  Recent Results (from the past 240  hour(s))  Urine Culture     Status: None   Collection Time: 07/05/18  4:14 PM  Result Value Ref Range Status   Urine Culture, Routine Final report  Final   Organism ID, Bacteria Comment  Final    Comment: Mixed urogenital  flora Less than 10,000 colonies/mL   Culture, Urine     Status: None   Collection Time: 07/09/18  3:11 PM  Result Value Ref Range Status   Specimen Description   Final    URINE, RANDOM Performed at [facility] [address] [place], [place] [address]    Special Requests   Final    NONE Performed at [facility], [place] [address], [place], [place] [address]    Culture   Final    NO GROWTH Performed at [facility], [place] [address], [place], [place] [address]    Report Status 07/11/2018 FINAL  Final     Radiology Studies: No results found.   Scheduled Meds: . amLODipine  10 mg Oral Daily  . citalopram  10 mg Oral Daily  . diclofenac sodium  2 g Topical QID  . ferrous sulfate  325 mg Oral BID WC  . folic acid  1 mg Oral Daily  . gabapentin  400 mg Oral TID  . methocarbamol  1,000 mg Oral TID  . mometasone-formoterol  2 puff Inhalation BID  . multivitamin with minerals  1 tablet Oral Daily  . nicotine  14 mg Transdermal Q24H  . pantoprazole  40 mg Oral BID AC  . predniSONE  10 mg Oral 4X daily taper  . QUEtiapine  150 mg Oral QHS  . senna-docusate  1 tablet Oral BID  . thiamine  100 mg Oral Daily   Or  . thiamine  100 mg Intravenous Daily    LOS: 5 days    Roxan Hockey, MD [facility] Pager [phone]  If 7PM-7AM, please contact night-coverage [email or web address] Password [facility] 07/14/2018, 5:35 PM

## 2018-07-14 NOTE — Care Management Important Message (Signed)
Important Message  Patient Details  Name: Melissa Bridges MRN: [id number] Date of Birth: [birth year]   Medicare Important Message Given:  Yes    Kerin Salen 07/14/2018, 10:41 AMImportant Message  Patient Details  Name: Melissa Bridges MRN: [id number] Date of Birth: [birth year]   Medicare Important Message Given:  Yes    Kerin Salen 07/14/2018, 10:41 AM
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
## 2018-07-15 LAB — GLUCOSE, CAPILLARY: GLUCOSE-CAPILLARY: 88 mg/dL (ref 70–99)

## 2018-07-15 MED ORDER — THIAMINE HCL 100 MG PO TABS: 100.0000 mg | ORAL_TABLET | Freq: Every day | ORAL | 2 refills | Status: DC

## 2018-07-15 MED ORDER — CITALOPRAM HYDROBROMIDE 10 MG PO TABS: 10.0000 mg | ORAL_TABLET | Freq: Every day | ORAL | 4 refills | Status: DC

## 2018-07-15 MED ORDER — ADULT MULTIVITAMIN W/MINERALS CH: 1.0000 | ORAL_TABLET | Freq: Every day | ORAL | 1 refills | Status: DC

## 2018-07-15 MED ORDER — ONDANSETRON HCL 4 MG PO TABS: 4.0000 mg | ORAL_TABLET | Freq: Four times a day (QID) | ORAL | 0 refills | Status: DC | PRN

## 2018-07-15 MED ORDER — PANTOPRAZOLE SODIUM 40 MG PO TBEC: 40.0000 mg | DELAYED_RELEASE_TABLET | Freq: Two times a day (BID) | ORAL | 3 refills | Status: DC

## 2018-07-15 MED ORDER — QUETIAPINE FUMARATE 50 MG PO TABS: 150.0000 mg | ORAL_TABLET | Freq: Every day | ORAL | 3 refills | Status: DC

## 2018-07-15 MED ORDER — AMLODIPINE BESYLATE 10 MG PO TABS: 10.0000 mg | ORAL_TABLET | Freq: Every day | ORAL | 1 refills | Status: DC

## 2018-07-15 MED ORDER — METHOCARBAMOL 750 MG PO TABS: 750.0000 mg | ORAL_TABLET | Freq: Three times a day (TID) | ORAL | 3 refills | Status: DC

## 2018-07-15 MED ORDER — OXYCODONE HCL 5 MG PO TABS: 5.0000 mg | ORAL_TABLET | ORAL | 0 refills | Status: DC | PRN

## 2018-07-15 MED ORDER — PANTOPRAZOLE SODIUM 40 MG PO TBEC: 40.0000 mg | DELAYED_RELEASE_TABLET | Freq: Every day | ORAL | 3 refills | Status: DC

## 2018-07-15 MED ORDER — PANTOPRAZOLE SODIUM 40 MG PO TBEC
40.0000 mg | DELAYED_RELEASE_TABLET | Freq: Two times a day (BID) | ORAL | 3 refills | Status: DC
Start: 2018-07-15 — End: 2018-07-15

## 2018-07-15 MED ORDER — SENNOSIDES-DOCUSATE SODIUM 8.6-50 MG PO TABS: 2.0000 | ORAL_TABLET | Freq: Every day | ORAL | 2 refills | Status: DC

## 2018-07-15 MED ORDER — FOLIC ACID 1 MG PO TABS: 1.0000 mg | ORAL_TABLET | Freq: Every day | ORAL | 2 refills | Status: DC

## 2018-07-15 MED ORDER — POLYETHYLENE GLYCOL 3350 17 G PO PACK: 17.0000 g | PACK | Freq: Every day | ORAL | 3 refills | Status: DC

## 2018-07-15 MED ORDER — VALACYCLOVIR HCL 1 G PO TABS: ORAL_TABLET | ORAL | 3 refills | Status: DC

## 2018-07-15 MED ORDER — NICOTINE 14 MG/24HR TD PT24: 14.0000 mg | MEDICATED_PATCH | TRANSDERMAL | 0 refills | Status: DC

## 2018-07-15 NOTE — Discharge Instructions (Signed)
1)Follow-up with your psychiatrist as advised within the next 1 week 2)Follow-up with your Neurosurgeon/Orthospine specialist for further evaluation of your low back problems 3)Avoid Alcohol and Street Drugs 4)Please consider outpatient rehab treatment programs for alcohol and drug abuse/dependence 5)Please take your medications as prescribed 6)You need  repeat complete blood count in about a week with your primary care doctor due to anemia 7)Avoid ibuprofen/Advil/Aleve/Motrin/Goody Powders/Naproxen/BC powders/Meloxicam/Diclofenac/Indomethacin and other Nonsteroidal anti-inflammatory medications as these will make you more likely to bleed and can cause stomach ulcers, can also cause Kidney problems.

## 2018-07-15 NOTE — Care Management Note (Signed)
Case Management Note  Patient Details  Name: Keyon Winnick MRN: [id number] Date of Birth: [birth year]  Subjective/Objective: 57 yo F with medical history of polysubstance abuse including crack cocaine, COPD, history of seizures, hepatitis C, schizoaffective disorder, lumbar disc herniation, chronic left hip-midback-left shoulder pain presented to the ED with Tylenol overdose with intent to kill self       Action/Plan: DME   Expected Discharge Date:   07/15/18               Expected Discharge Plan:  [facility])  In-House Referral:     Discharge planning Services  CM Consult  Post Acute Care Choice:  Durable Medical Equipment Choice offered to:  Patient  DME Arranged:  3-N-1, Walker rolling with seat DME Agency:  [place]:    [facility] Agency:     Status of Service:  Completed, signed off  If discussed at [place] of Stay Meetings, dates discussed:    Additional Comments: Met with pt. She plans to return to her apartment at [facility]. Her daughter is picking her up. She reports that she is able to manage her ADL's. She reports that she has a [place] on M, W, and F for 1 1/2 hrs. She has Medicare, Medicaid and [employer] and she denies any issues filling her prescriptions. She needs a RW with a seat. Contacted Reggie at [facility] for DME referral.  Norina Buzzard, RN 07/15/2018, 1:30 PM

## 2018-07-15 NOTE — Progress Notes (Signed)
Pt leaving at 1800 this evening with her daughter and granddaughter. Discharge instructions/prescriptions given/explained with pt verbalizing understanding. Pt returning to "[employer]". Pt leaving with suitcase and rolling walker.

## 2018-07-15 NOTE — Discharge Summary (Signed)
Melissa Bridges, is a 57 y.o. female  DOB 1961/02/12  MRN [id number].  Admission date:  07/09/2018  Admitting Physician  Eugenie Filler, MD  Discharge Date:  07/15/2018   Primary MD  Forrest Moron, MD  Recommendations for primary care physician for things to follow:   1)Follow-up with your psychiatrist as advised within the next 1 week 2)Follow-up with your Neurosurgeon/Orthospine specialist for further evaluation of your low back problems 3)Avoid Alcohol and Street Drugs 4)Please consider outpatient rehab treatment programs for alcohol and drug abuse/dependence 5)Please take your medications as prescribed 6)You need  repeat complete blood count in about a week with your primary care doctor due to anemia 7)Avoid ibuprofen/Advil/Aleve/Motrin/Goody Powders/Naproxen/BC powders/Meloxicam/Diclofenac/Indomethacin and other Nonsteroidal anti-inflammatory medications as these will make you more likely to bleed and can cause stomach ulcers, can also cause Kidney problems.    Admission Diagnosis  Intentional drug overdose, initial encounter ([place]) [T50.902A] Intentional acetaminophen overdose, initial encounter ([place]) [T39.1X2A]   Discharge Diagnosis  Intentional drug overdose, initial encounter ([place]) [T50.902A] Intentional acetaminophen overdose, initial encounter ([place]) [T39.1X2A]    Principal Problem:   Schizoaffective disorder, bipolar type ([place]) Active Problems:   Smoker   Chronic hepatitis C without hepatic coma (HCC)   Hypertension   Lower back pain   Seizure disorder (HCC)   Lumbar disc herniation   Hypokalemia   Tylenol overdose   Abdominal pain   COPD (chronic obstructive pulmonary disease) (HCC)   Cocaine abuse ([place])   Hypomagnesemia   Chronic left hip pain   Suicidal ideation   Anemia   Overdose   Melena      Past Medical History:  Diagnosis Date  . Allergy   . Alopecia   . Anxiety   .  Arthritis   . Asthma   . Cataract    "mild"  . COPD (chronic obstructive pulmonary disease) ([place])   . Depression   . GERD (gastroesophageal reflux disease)    o cc- takes OTC if needed.  . Glaucoma   . Hepatitis C   . Hypertension    onset age 72.  . Iron deficiency anemia   . Migraine   . Schizoaffective disorder ([place])   . Sciatic pain   . Seizures ([place])    onset in childhood. 06-03-16- per pt 8 months agoGeneralized tonic-clonic.Last seizure couple of months ago- last sz 9-10 months ago per pt 10-12-2016  . Shortness of breath dyspnea   . Substance abuse ([place])   . Ulcer     Past Surgical History:  Procedure Laterality Date  . ABDOMINAL HYSTERECTOMY     ovarian cyst B, cervical dysplasia, fibroids.   Ovaries intact.  [name] BIOPSY  07/11/2018   Procedure: BIOPSY;  Surgeon: Yetta Flock, MD;  Location: [facility];  Service: Gastroenterology;;  . COLONOSCOPY    . COLONOSCOPY W/ BIOPSIES    . DILATION AND CURETTAGE OF UTERUS     x2  . ESOPHAGOGASTRODUODENOSCOPY (EGD) WITH PROPOFOL N/A 07/11/2018   Procedure: ESOPHAGOGASTRODUODENOSCOPY (EGD) WITH PROPOFOL;  Surgeon: Yetta Flock, MD;  Location: [facility];  Service: Gastroenterology;  Laterality: N/A;  . EXPLORATORY LAPAROTOMY     x3  . GYNECOLOGIC CRYOSURGERY     x3  . HOT HEMOSTASIS N/A 07/11/2018   Procedure: HOT HEMOSTASIS (ARGON PLASMA COAGULATION/BICAP);  Surgeon: Yetta Flock, MD;  Location: [facility];  Service: Gastroenterology;  Laterality: N/A;  . MANDIBLE RECONSTRUCTION N/A 06/27/2015   Procedure: REMOVAL MAXILLARY PALATAL TORUS.  REMOVAL MANDIBULAR TORUS AND EXOSTOSIS.  ;  Surgeon: Diona Browner, DDS;  Location: [facility];  Service: Oral Surgery;  Laterality: N/A;  . OVARIAN CYST REMOVAL  2008  . POLYPECTOMY       HPI  from the history and physical done on the day of admission:   Chief Complaint: Took too many pills, this time was trying to kill myself.  HPI: Melissa Bridges is a 57 y.o.  female with medical history significant of polysubstance abuse including crack cocaine, COPD, history of seizures, hepatitis C, schizoaffective disorder, lumbar disc herniation presented to the ED with Tylenol overdose.  Patient is a poor historian and as such history obtained both from patient and per ED physician.  Patient stated that she had a prior history of crack cocaine use and was clean for 5 years however recently relapsed and has used cocaine 3 times recently last use was the night prior to admission.  Patient does state that she took many pills of Tylenol to help with back pain and as well this time around she was trying to kill herself as her granddaughter recently told her that she did not want her to come to her graduation as patient was abusing cocaine.  Per ED physician's note patient took approximately 15 Tylenol tablets one day prior to admission each 500 mg and another 60 tablets throughout the course of the morning on the day of admission.  Patient does endorse low-grade fever of 100.1, hot and cold chills, no change in chronic back pain which she states she is supposed to follow-up with her neurosurgeon for, chronic shortness of breath from asthma and COPD which is unchanged, dysuria, black stools.  Patient denies any nausea or vomiting, no constipation, no diarrhea, no chest pain.  Patient does endorse diffuse upper abdominal pain.  Patient does endorse generalized weakness from her back pain.  Patient denies any hematochezia, no hematemesis.  Patient denies any syncopal episodes, no recent visual abnormalities, no asymmetric weakness or numbness.    ED Course: Patient seen in the ED, compressive metabolic profile obtained with a potassium of 3.3, creatinine of 1.39, glucose of 105, AST of 73, protein of 8.7 otherwise was within normal limits.  CBC had a hemoglobin of 9.8 otherwise was within normal limits.  Urine pregnancy test was negative.  Acetaminophen level was 131.  Salicylate level  was less than 7.  INR of 0.97.  PT of 12.8.  UDS was positive for cocaine.  EKG with normal sinus rhythm, LVH.  Patient placed on Mucomyst and given IV fluids.    Hospital Course:  Assessment & Plan:   Principal Problem:   Schizoaffective disorder, bipolar type ([place]) Active Problems:   Smoker   Chronic hepatitis C without hepatic coma (HCC)   Hypertension   Lower back pain   Seizure disorder (HCC)   Lumbar disc herniation   Hypokalemia   Tylenol overdose   Abdominal pain   COPD (chronic obstructive pulmonary disease) (HCC)   Cocaine abuse (HCC)   Hypomagnesemia   Chronic left hip pain   Suicidal ideation  Anemia   Overdose   Melena    Brief Narrative: Patient is a pleasant 57 year old lady history of polysubstance abuse including crack cocaine, COPD, history of seizures, hep C, alcohol use, schizoaffective disorder, chronic lumbar disc herniation presented to the ED with a Tylenol overdose.  Patient also noted to be anemic with a severe iron deficiency anemia.  Patient pancultured.  Patient placed on Mucomyst and poison control contacted. UDS was positive for cocaine  Patient has been cleared by psychiatrist upon reevaluation on 07/14/18,  patient no longer needs inpatient psychiatric treatment, awaiting transfer to skilled nursing facility   Plan- #1 Tylenol Overdose/Suicidal ideation Patient presented with a Tylenol overdose stated that she had significant back pain which was not as relieved with her crack cocaine and also had suicidal ideation as her grand daughter had told her she could not attend her graduation due to her history of polysubstance use.  Patient was placed on Mucomyst.  Repeat Tylenol level less than 10.  Tylenol level on admission was 131.  LFTs with no significant change.  INR at 1.04.  Discontinued Mucomyst per recommendations from poison control.  Supportive care.   Patient has been cleared by psychiatrist upon reevaluation on 07/14/18,  patient no  longer needs inpatient psychiatric treatment,  Continue Gabapentin 400 mg TID for pain/anxiety, Celexa 10 mg daily for depression and anxiety and Seroquel 150 mg qhs for mood stabilization/insomnia.  Patient is alert more awake and more interactive since Seroquel was decreased to 150 mg from 300 mg  2.  H/o Chronic  iron deficiency anemia Patient noted to be anemic on admission with a hemoglobin of 9.8.  Hgb > 9,   Patient on admission with complaints of melanotic stools.  Anemia panel consistent with a severe iron deficiency anemia with iron level of 12, ferritin of 10. FOBT negative.  Patient noted to have a colonoscopy 06/03/2016 with a large polyp that was removed via piecemeal as well as nonbleeding internal hemorrhoids.  Dr Grandville Silos discusses Case  with GI and patient status post upper endoscopy on  07/11/2018  showed 2 nonbleeding angioectasias in stomach and duodenum, duodenal angiodysplastic lesions treated with APC , Patient status post IV Feraheme.    Avoid NSAIDs, repeat CBC with PCP in about a month  3.  Hypokalemia/hypomagnesemia - Replaced   4.  Dysuria Urinalysis unremarkable.  Urine cultures negative.  Follow.  5. Abdominal pain Patient with diffuse upper abdominal pain which she states is chronic in nature. Patient states she is supposed to follow-up with a gastroenterologist in the outpatient setting.  Lipase levels within normal limits.  Tolerating current diet.  Continue PPI.  Follow.  6. Polysubstance use/cocaine use Patient states she was clean for 5 years however relapsed recently and has used crack cocaine 3 times last use was the night prior to admission.   UDS was positive for cocaine on admission, cessation of polysubstance use stressed to patient. outPatient rehab programs advised  7. Alcohol use Patient does endorse alcohol use. Patient states she drinks as much as she can get her hands on and states he drinks both beer and liquor.   Patient with no  significant withdrawal symptoms.    No evidence of alcohol withdrawal, c/n  thiamine, multivitamin, folic acid.  Outpatient rehab program advised  8. Schizoaffective disorder Patient seen in consultation by psychiatry who reevaluated pt in 07/14/18 and cleared patient for discharge,.  Continue gabapentin, Celexa and Seroquel 150 mg nightly for mood stabilization/insomnia.    Patient  has been cleared by psychiatrist upon reevaluation on 07/14/18,  patient no longer needs inpatient psychiatric treatment, c/n  Gabapentin 400 mg TID for pain/anxiety, Celexa 10 mg daily for depression and anxiety and Seroquel 150 mg qhs for mood stabilization/insomnia.  9. Chronic low back pain- Patient states she is supposed to follow-up with her neurosurgeon. Patient with Tylenol overdose and as such cannot placed on Tylenol as needed. Patient with a history of peptic ulcer disease and now complaining of melanotic stools and as such can't placed on any NSAIDs.  Lidoderm patch was ordered however patient unable to tolerate and as such Lidoderm patch has been discontinued.  Ultram was also discontinued.  Patient placed on Neurontin, Sterapred, Voltaren gel with some clinical improvement.  Monitor for now.  C/n  methocarbamol  750 mg  3 times daily  10. COPD/tobacco abuse. Stable.   No acute exacerbation at this time, tobacco cessation advised. Continue nicotine patch, Dulera and Spiriva.    11. History of seizure disorder-no recent seizures,, continue gabapentin .   Discontinued Ultram.  Monitor for now.   #12 hypertension-improved with Norvasc   13)Unsteady gait/Low back pain--- PT reevaluated patient on 07/15/2018 recommend discharge home with home health PT and fall with walker, give gabapentin, methocarbamol and sparingly use oxycodone for pain control until patient can be seen by neurosurgery as outpatient  DVT prophylaxis: SCDs Code Status: Full Family Communication: Updated patient.  No family at  bedside. Disposition Plan:     home with [facility] Consultants:   Gastroenterology: Dr. Havery Moros 07/10/2018  Psychiatry consultation: Dr. Mariea Clonts 07/10/2018.  Procedures:  Acute abdominal series 07/09/2018   Upper endoscopy per Dr. Havery Moros 07/11/2018   Discharge Condition: stable  Diet and Activity recommendation:  As advised  Discharge Instructions    Discharge Instructions    Call MD for:  difficulty breathing, headache or visual disturbances   Complete by:  As directed    Call MD for:  persistant dizziness or light-headedness   Complete by:  As directed    Call MD for:  persistant nausea and vomiting   Complete by:  As directed    Call MD for:  severe uncontrolled pain   Complete by:  As directed    Call MD for:  temperature >100.4   Complete by:  As directed    Diet - low sodium heart healthy   Complete by:  As directed    Discharge instructions   Complete by:  As directed    1)Follow-up with your psychiatrist as advised within the next 1 week 2)Follow-up with your Neurosurgeon/Orthospine specialist for further evaluation of your low back problems 3)Avoid Alcohol and Street Drugs 4)Please consider outpatient rehab treatment programs for alcohol and drug abuse/dependence 5)Please take your medications as prescribed 6)You need  repeat complete blood count in about a week with your primary care doctor due to anemia 7)Avoid ibuprofen/Advil/Aleve/Motrin/Goody Powders/Naproxen/BC powders/Meloxicam/Diclofenac/Indomethacin and other Nonsteroidal anti-inflammatory medications as these will make you more likely to bleed and can cause stomach ulcers, can also cause Kidney problems.   Increase activity slowly   Complete by:  As directed         Discharge Medications     Allergies as of 07/15/2018      Reactions   Fruit & Vegetable Daily [nutritional Supplements] Shortness Of Breath   Aloe   Iodinated Diagnostic Agents Swelling   Swelling and itching of left side of her face  only after CT SI injection(no steroid used)   Aloe Vera Rash  Medication List    TAKE these medications   acetaminophen 500 MG tablet Commonly known as:  TYLENOL Take 500 mg by mouth once.   albuterol 108 (90 Base) MCG/ACT inhaler Commonly known as:  VENTOLIN HFA INHALE TWO PUFFS BY MOUTH EVERY 6 HOURS AS NEEDED FOR SHORTNESS OF BREATH   amLODipine 10 MG tablet Commonly known as:  NORVASC Take 1 tablet (10 mg total) by mouth daily. What changed:    medication strength  how much to take   budesonide-formoterol 80-4.5 MCG/ACT inhaler Commonly known as:  SYMBICORT Inhale 2 puffs into the lungs 2 (two) times daily.   citalopram 10 MG tablet Commonly known as:  CELEXA Take 1 tablet (10 mg total) by mouth daily. For depression What changed:  Another medication with the same name was added. Make sure you understand how and when to take each.   citalopram 10 MG tablet Commonly known as:  CELEXA Take 1 tablet (10 mg total) by mouth daily. Start taking on:  07/16/2018 What changed:  You were already taking a medication with the same name, and this prescription was added. Make sure you understand how and when to take each.   diclofenac sodium 1 % Gel Commonly known as:  VOLTAREN Apply 2 g topically 4 (four) times daily.   ferrous sulfate 325 (65 FE) MG tablet Take 1 tablet (325 mg total) by mouth 2 (two) times daily with a meal.   folic acid 1 MG tablet Commonly known as:  FOLVITE Take 1 tablet (1 mg total) by mouth daily. Start taking on:  07/16/2018   gabapentin 400 MG capsule Commonly known as:  NEURONTIN Take 1 capsule (400 mg total) by mouth 3 (three) times daily.   GETGO ROLLING WALKER Misc Sig: one rolling walker any name brand with ambulation. DDD lumbar spine   ipratropium-albuterol 0.5-2.5 (3) MG/3ML Soln Commonly known as:  DUONEB Take 3 mLs by nebulization every 4 (four) hours as needed (shortness of breath). Reported on 12/02/2015   LamoTRIgine 25 MG  Tb24 24 hour tablet Commonly known as:  LAMICTAL XR take 1 tablet daily for 2 weeks, then increase to 2 tablets daily for 2 weeks   methocarbamol 500 MG tablet Commonly known as:  ROBAXIN Take 2 tablets (1,000 mg total) by mouth every 8 (eight) hours as needed for muscle spasms. What changed:  Another medication with the same name was added. Make sure you understand how and when to take each.   methocarbamol 750 MG tablet Commonly known as:  ROBAXIN Take 1 tablet (750 mg total) by mouth 3 (three) times daily. What changed:  You were already taking a medication with the same name, and this prescription was added. Make sure you understand how and when to take each.   multivitamin with minerals Tabs tablet Take 1 tablet by mouth daily. Start taking on:  07/16/2018   nicotine 14 mg/24hr patch Commonly known as:  NICODERM CQ - dosed in mg/24 hours Place 1 patch (14 mg total) onto the skin daily.   ondansetron 4 MG tablet Commonly known as:  ZOFRAN Take 1 tablet (4 mg total) by mouth every 6 (six) hours as needed for nausea.   oxyCODONE 5 MG immediate release tablet Commonly known as:  Oxy IR/ROXICODONE Take 1 tablet (5 mg total) by mouth every 4 (four) hours as needed for moderate pain.   pantoprazole 40 MG tablet Commonly known as:  PROTONIX Take 1 tablet (40 mg total) by mouth 2 (two) times daily  before a meal.   polyethylene glycol packet Commonly known as:  MIRALAX Take 17 g by mouth daily.   QUEtiapine 50 MG tablet Commonly known as:  SEROQUEL Take 3 tablets (150 mg total) by mouth at bedtime. What changed:    medication strength  how much to take  additional instructions   senna-docusate 8.6-50 MG tablet Commonly known as:  Senokot-S Take 2 tablets by mouth at bedtime.   thiamine 100 MG tablet Take 1 tablet (100 mg total) by mouth daily. Start taking on:  07/16/2018   triamcinolone cream 0.1 % Commonly known as:  KENALOG APPLY  CREAM EXTERNALLY TWICE DAILY     TYLENOL PM EXTRA STRENGTH PO Take 500 mg by mouth once.   valACYclovir 1000 MG tablet Commonly known as:  VALTREX Take 2 tabs po at symptom onset, repeat in 12 hours What changed:    how much to take  how to take this  when to take this  reasons to take this  additional instructions            Durable Medical Equipment  (From admission, onward)        Start     Ordered   07/15/18 1245  For home use only DME 4 wheeled rolling walker with seat  Once    Question:  Patient needs a walker to treat with the following condition  Answer:  Unsteady gait   07/15/18 1244   07/10/18 1133  For home use only DME Walker rolling  Once    Question:  Patient needs a walker to treat with the following condition  Answer:  Debility   07/10/18 1132      Major procedures and Radiology Reports - PLEASE review detailed and final reports for all details, in brief -   Acute Abdominal Series  Result Date: 07/09/2018 CLINICAL DATA:  Overdose EXAM: DG ABDOMEN ACUTE W/ 1V CHEST COMPARISON:  CT 05/23/2018 FINDINGS: There is no evidence of dilated bowel loops or free intraperitoneal air. No radiopaque calculi or other significant radiographic abnormality is seen. Heart size and mediastinal contours are within normal limits. Both lungs are clear. IMPRESSION: Negative abdominal radiographs.  No acute cardiopulmonary disease. Electronically Signed   By: Rolm Baptise M.D.   On: 07/09/2018 20:52   [place] Ekg Site Rite  Result Date: 07/09/2018 If Site Rite image not attached, placement could not be confirmed due to current cardiac rhythm.   Micro Results    Recent Results (from the past 240 hour(s))  Culture, Urine     Status: None   Collection Time: 07/09/18  3:11 PM  Result Value Ref Range Status   Specimen Description   Final    URINE, RANDOM Performed at [facility] [address], [place], [place] [address]    Special Requests   Final    NONE Performed at [facility], [place] [address] [place], [place] [address]    Culture   Final    NO GROWTH Performed at [facility], [place] [address] [place], [place] [address]    Report Status 07/11/2018 FINAL  Final       Today   Subjective    Melissa Bridges today has no new complaints, she is excited/happy about going home          Patient has been seen and examined prior to discharge   Objective   Blood pressure 131/67, pulse 84, temperature 98.6 F (37 C), temperature source Oral,  resp. rate 14, height 5\' 4"  (1.626 m), weight 48.6 kg (107 lb 2.3 oz), SpO2 100 %.   Intake/Output Summary (Last 24 hours) at 07/15/2018 1643 Last data filed at 07/15/2018 0800 Gross per 24 hour  Intake 240 ml  Output -  Net 240 ml    Exam Gen:- Awake Alert,  In no apparent distress  HEENT:- [place].AT, No sclera icterus Neck-Supple Neck,No JVD,.  Lungs-  CTAB , good air movement CV- S1, S2 normal Abd-  +ve B.Sounds, Abd Soft, No tenderness,    Extremity/Skin:- No  edema, good  pulses Psych-affect is appropriate, oriented x3 Neuro-no new focal deficits, no tremors MSK/Low back-gait is more steady with a walker, low back pain is better with methocarbamol and PRN oxycodone,  Data Review   CBC w Diff:  Lab Results  Component Value Date   WBC 11.9 (H) 07/14/2018   HGB 9.1 (L) 07/14/2018   HGB 9.9 (L) 07/05/2018   HCT 29.4 (L) 07/14/2018   HCT 32.0 (L) 07/05/2018   PLT 249 07/14/2018   PLT 311 07/05/2018   LYMPHOPCT 6 07/11/2018   MONOPCT 5 07/11/2018   EOSPCT 0 07/11/2018   BASOPCT 0 07/11/2018    CMP:  Lab Results  Component Value Date   NA 140 07/14/2018   NA 138 07/05/2018   K 4.0 07/14/2018   CL 101 07/14/2018   CO2 33 (H) 07/14/2018   BUN 13 07/14/2018   BUN 11 07/05/2018   CREATININE 0.75 07/14/2018   CREATININE 0.77 09/04/2016   PROT 7.5 07/14/2018   PROT 8.7 (H) 07/05/2018   ALBUMIN 3.5 07/14/2018   ALBUMIN 4.8 07/05/2018   BILITOT 0.4 07/14/2018   BILITOT 0.4  07/05/2018   ALKPHOS 101 07/14/2018   AST 95 (H) 07/14/2018   ALT 78 (H) 07/14/2018   Total Discharge time is about 33 minutes  Roxan Hockey M.D on 07/15/2018 at 4:43 PM   Go to [email or web address] - password [email or web address] for contact info  [facility] - Office  [phone]

## 2018-07-15 NOTE — Progress Notes (Signed)
Physical Therapy Treatment Patient Details Name: Melissa Bridges MRN: [id number] DOB: Mar 11, 1961 Today's Date: 07/15/2018    History of Present Illness  Melissa Bridges is a 57 y.o. female with medical history significant of polysubstance abuse including crack cocaine, COPD, history of seizures, hepatitis C, schizoaffective disorder, lumbar disc herniation, chronic left hip-midback-left shoulder pain presented to the ED with Tylenol overdose with intent to kill self    PT Comments    The patient preferd to Return to her home. Recommend rollator/4 wheeled RW.  Continue PT.    Follow Up Recommendations  Home health PT     Equipment Recommendations  (4 wheeled RW)    Recommendations for Other Services       Precautions / Restrictions Precautions Precautions: Fall    Mobility  Bed Mobility Overal bed mobility: Independent                Transfers Overall transfer level: Needs assistance Equipment used: 4-wheeled walker Transfers: Sit to/from Stand Sit to Stand: Supervision            Ambulation/Gait Ambulation/Gait assistance: Min guard;Supervision Gait Distance (Feet): 200 Feet Assistive device: 4-wheeled walker Gait Pattern/deviations: Step-to pattern;Decreased step length - right;Decreased stance time - left;Decreased stride length;Decreased dorsiflexion - left;Decreased weight shift to left;Antalgic;Drifts right/left;Narrow base of support;Trunk flexed Gait velocity: decreased   General Gait Details: very unsteady gait with  no AD, did much better  with rollator   Stairs             Wheelchair Mobility    Modified Rankin (Stroke Patients Only)       Balance Overall balance assessment: Needs assistance   Sitting balance-Leahy Scale: Good     Standing balance support: During functional activity;No upper extremity supported Standing balance-Leahy Scale: Poor Standing balance comment: good balance with RW                             Cognition Arousal/Alertness: Awake/alert Behavior During Therapy: Impulsive                                          Exercises      General Comments        Pertinent Vitals/Pain Faces Pain Scale: Hurts little more Pain Location: back Pain Descriptors / Indicators: Aching;Sore Pain Intervention(s): Monitored during session;Heat applied    Home Living                      Prior Function            PT Goals (current goals can now be found in the care plan section) Progress towards PT goals: Progressing toward goals    Frequency    Min 2X/week      PT Plan Discharge plan needs to be updated    Co-evaluation              AM-PAC PT "6 Clicks" Daily Activity  Outcome Measure  Difficulty turning over in bed (including adjusting bedclothes, sheets and blankets)?: None Difficulty moving from lying on back to sitting on the side of the bed? : None Difficulty sitting down on and standing up from a chair with arms (e.g., wheelchair, bedside commode, etc,.)?: A Little Help needed moving to and from a bed to chair (including a wheelchair)?: A Little Help needed walking in  hospital room?: A Little Help needed climbing 3-5 steps with a railing? : A Lot 6 Click Score: 19    End of Session Equipment Utilized During Treatment: Gait belt Activity Tolerance: Patient tolerated treatment well Patient left: in bed;with call bell/phone within reach Nurse Communication: Mobility status PT Visit Diagnosis: Unsteadiness on feet (R26.81);Muscle weakness (generalized) (M62.81);Difficulty in walking, not elsewhere classified (R26.2)     Time: 1130-1145 PT Time Calculation (min) (ACUTE ONLY): 15 min  Charges:  $Gait Training: 8-22 mins                     [facility] PT [phone]    Claretha Cooper 07/15/2018, 1:45 PM

## 2018-07-15 NOTE — Progress Notes (Signed)
Pt did not leave with suitcase. Only pocketbook, rolling walker, and cell phone. Staff could not locate pt's "duffle bag with green,white,black stripes" that pt states she came to hospital with. Have called ED, checked floor pt belonging bins. Will alert leadership.

## 2018-07-18 ENCOUNTER — Other Ambulatory Visit: Payer: Self-pay

## 2018-07-24 ENCOUNTER — Telehealth: Payer: Self-pay | Admitting: Family Medicine

## 2018-07-24 NOTE — Telephone Encounter (Signed)
Copied from [place] [phone]. Topic: Quick Communication - See Telephone Encounter >> Jul 24, 2018  1:50 PM Conception Chancy, NT wrote: CRM for notification. See Telephone encounter for: 07/24/18.  Patient is calling and states she previously seen Dr. Tamala Julian and she informed her that she would be getting more health care nurse aid hours for them to come and help her. Patient is calling to leave the number with Dr. Nolon Rod. [place] phone# [phone]

## 2018-07-27 ENCOUNTER — Encounter: Payer: Self-pay | Admitting: Neurology

## 2018-07-27 ENCOUNTER — Ambulatory Visit (INDEPENDENT_AMBULATORY_CARE_PROVIDER_SITE_OTHER): Payer: 59 | Admitting: Neurology

## 2018-07-27 ENCOUNTER — Other Ambulatory Visit: Payer: Self-pay

## 2018-07-27 VITALS — BP 106/68 | HR 79 | Ht 69.75 in | Wt 111.0 lb

## 2018-07-27 DIAGNOSIS — G40009 Localization-related (focal) (partial) idiopathic epilepsy and epileptic syndromes with seizures of localized onset, not intractable, without status epilepticus: Secondary | ICD-10-CM

## 2018-07-27 MED ORDER — LAMOTRIGINE 25 MG PO TABS: ORAL_TABLET | ORAL | 11 refills | Status: DC

## 2018-07-27 NOTE — Telephone Encounter (Signed)
Please advise.  Is ok to give verbal order for more hours for Aide to help pt. Dgaddy, CMA

## 2018-07-27 NOTE — Progress Notes (Signed)
NEUROLOGY FOLLOW UP OFFICE NOTE  Melissa Bridges [id number]  DOB: 1961/05/31  HISTORY OF PRESENT ILLNESS: I had the pleasure of seeing Melissa Bridges in follow-up in the neurology clinic on 07/27/2018.  The patient was last seen almost 3 years ago for recurrent seizures. MRI brain with and without contrast no acute changes, hippocampi symmetric without abnormal signal or enhancement seen. 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 Her wake and drowsy EEG was normal. She was lost to follow-up for almost 3 years and presents today reporting that she was unable to take the Lamictal XR prescribed in 2016 due to insurance issues but had never informed our office. She states she had been taking left over Depakote from her prior physician and ran out 4 months ago. She reports that she had been seizure-free for over a year, until she had a seizure last June which occurred in the setting of Tylenol overdose. EPIC records reviewed, she was in the hospital on 59/11/18 and 07/09/18 for Tylenol overdose. On review of June 2019 admission, she presented for abdominal pain after ingesting 20 Tylenol pills. There is no mention of seizure reported. She states her neighbors witnessed it. She lives alone in an apartment complex for patients with Psychiatric disorders, they have 2 staff that oversee the residents. She is also on Gabapentin 400mg  TID for the back pain but states it does not help. She still takes Tylenol but "not as many," she has good and bad days. She denies any olfactory/gustatory hallucinations, focal numbness/tingling/weakness, myoclonic jerks. She has been told she would stare off sometimes. She has headaches when stressed out. No dizziness or vision changes. She reports that she continues to see Psychiatry. She does not drive.  History on Initial Assessment 10/15/2015: This is a pleasant 57 yo RH woman with a history of hypertension,  schizoaffective disorder, and seizures since childhood, with recurrent seizures. She has "always had seizures." She would usually have a headache or hear a high pitched sound, followed by loss of hearing, then she loses consciousness and has stiffening and shaking. Sometimes she smells blood and feels sick before a seizure. In the past she could feel them coming on but recently they "just hit." She has injured herself and woken up on the ground, hit her head a few times, with tongue bite and urinary incontinence. Her daughter has never seen any of the seizures, but reports her granddaughter has witnessed the stiffening and shaking, and has seen a couple of episodes where she blanks out for a second or so. The last seizure she is aware of occurred September 2016, however she was told by a friend that she had 2 seizures in her sleep in October 2016, she had woken up feeling sore. Her longest seizure-free interval has been at least 2-3 months. She recalls being on Depakote and Dilantin in the past when she was having seizures "back to back," then when they quieted down some, she was taken off Dilantin and has been on Depakote for many years now. Her daughter reports some compliance issues, a Depakote level in August 2016 was low at 37.1. She is supposed to be taking Depakote DR 1500mg  qhs. In the past she was instructed to take it TID, but felt better taking them all at the same time. Her daughter reports that she does not take it regularly because it affects her mood. With her schizoaffective disorder, she reports auditory and visual hallucinations. The  visual hallucinations are better, she is still hearing things but not like before.   Epilepsy Risk Factors: There is a strong family history of seizures. Her paternal uncle died from a seizure. Her maternal aunt, mother, and nephew and niece (sister's children) have seizures. Otherwise she had a normal birth and early development. There is no history of febrile  convulsions, CNS infections such as meningitis/encephalitis, significant traumatic brain injury, neurosurgical procedures.  PAST MEDICAL HISTORY: Past Medical History:  Diagnosis Date  . Allergy   . Alopecia   . Anxiety   . Arthritis   . Asthma   . Cataract    "mild"  . COPD (chronic obstructive pulmonary disease) ([place])   . Depression   . GERD (gastroesophageal reflux disease)    o cc- takes OTC if needed.  . Glaucoma   . Hepatitis C   . Hypertension    onset age 53.  . Iron deficiency anemia   . Migraine   . Schizoaffective disorder ([place])   . Sciatic pain   . Seizures ([place])    onset in childhood. 06-03-16- per pt 8 months agoGeneralized tonic-clonic.Last seizure couple of months ago- last sz 9-10 months ago per pt 10-12-2016  . Shortness of breath dyspnea   . Substance abuse ([place])   . Ulcer     MEDICATIONS: Current Outpatient Medications on File Prior to Visit  Medication Sig Dispense Refill  . acetaminophen (TYLENOL) 500 MG tablet Take 500 mg by mouth once.    [name] albuterol (VENTOLIN HFA) 108 (90 Base) MCG/ACT inhaler INHALE TWO PUFFS BY MOUTH EVERY 6 HOURS AS NEEDED FOR SHORTNESS OF BREATH 18 g 1  . amLODipine (NORVASC) 10 MG tablet Take 1 tablet (10 mg total) by mouth daily. 30 tablet 1  . budesonide-formoterol (SYMBICORT) 80-4.5 MCG/ACT inhaler Inhale 2 puffs into the lungs 2 (two) times daily. 3 Inhaler 1  . citalopram (CELEXA) 10 MG tablet Take 1 tablet (10 mg total) by mouth daily. 30 tablet 4  . diphenhydrAMINE-APAP, sleep, (TYLENOL PM EXTRA STRENGTH PO) Take 500 mg by mouth once.     . ferrous sulfate 325 (65 FE) MG tablet Take 1 tablet (325 mg total) by mouth 2 (two) times daily with a meal. 956 tablet 1  . folic acid (FOLVITE) 1 MG tablet Take 1 tablet (1 mg total) by mouth daily. 30 tablet 2  . gabapentin (NEURONTIN) 400 MG capsule Take 1 capsule (400 mg total) by mouth 3 (three) times daily. 270 capsule 1  . ipratropium-albuterol (DUONEB) 0.5-2.5 (3) MG/3ML SOLN  Take 3 mLs by nebulization every 4 (four) hours as needed (shortness of breath). Reported on 12/02/2015    . methocarbamol (ROBAXIN) 500 MG tablet Take 2 tablets (1,000 mg total) by mouth every 8 (eight) hours as needed for muscle spasms. 60 tablet 0  . methocarbamol (ROBAXIN) 750 MG tablet Take 1 tablet (750 mg total) by mouth 3 (three) times daily. 90 tablet 3  . Misc. Devices ([place]) MISC Sig: one rolling walker any name brand with ambulation. DDD lumbar spine 1 each 0  . Multiple Vitamin (MULTIVITAMIN WITH MINERALS) TABS tablet Take 1 tablet by mouth daily. 30 tablet 1  . nicotine (NICODERM CQ - DOSED IN MG/24 HOURS) 14 mg/24hr patch Place 1 patch (14 mg total) onto the skin daily. 28 patch 0  . ondansetron (ZOFRAN) 4 MG tablet Take 1 tablet (4 mg total) by mouth every 6 (six) hours as needed for nausea. 20 tablet 0  .  oxyCODONE (OXY IR/ROXICODONE) 5 MG immediate release tablet Take 1 tablet (5 mg total) by mouth every 4 (four) hours as needed for moderate pain. 15 tablet 0  . pantoprazole (PROTONIX) 40 MG tablet Take 1 tablet (40 mg total) by mouth 2 (two) times daily before a meal. 60 tablet 3  . polyethylene glycol (MIRALAX) packet Take 17 g by mouth daily. 30 each 3  . QUEtiapine (SEROQUEL) 50 MG tablet Take 3 tablets (150 mg total) by mouth at bedtime. 90 tablet 3  . senna-docusate (SENOKOT-S) 8.6-50 MG tablet Take 2 tablets by mouth at bedtime. 60 tablet 2  . thiamine 100 MG tablet Take 1 tablet (100 mg total) by mouth daily. 30 tablet 2  . valACYclovir (VALTREX) 1000 MG tablet Take 2 tabs po at symptom onset, repeat in 12 hours 20 tablet 3  . diclofenac sodium (VOLTAREN) 1 % GEL Apply 2 g topically 4 (four) times daily. (Patient not taking: Reported on 07/09/2018) 1 Tube 0  . LamoTRIgine (LAMICTAL XR) 25 MG TB24 24 hour tablet take 1 tablet daily for 2 weeks, then increase to 2 tablets daily for 2 weeks (Patient not taking: Reported on 07/27/2018) 45 tablet 0  . triamcinolone  cream (KENALOG) 0.1 % APPLY  CREAM EXTERNALLY TWICE DAILY (Patient not taking: Reported on 07/09/2018) 30 g 0   No current facility-administered medications on file prior to visit.     ALLERGIES: Allergies  Allergen Reactions  . Fruit & Vegetable Daily [Nutritional Supplements] Shortness Of Breath    Aloe  . Iodinated Diagnostic Agents Swelling    Swelling and itching of left side of her face only after CT SI injection(no steroid used)  . Aloe Vera Rash    FAMILY HISTORY: Family History  Problem Relation Age of Onset  . Diabetes type II Mother   . Hypertension Mother   . Arthritis Mother   . Diabetes Mother   . Mental illness Mother        bipolar  . Diabetes type II Maternal Aunt   . Hypertension Father   . Heart murmur Father   . Arthritis Father   . Stroke Father   . Alopecia Sister   . Alopecia Brother   . Alopecia Sister   . Mental retardation Sister        depression  . Prostate cancer Paternal Grandfather   . Cancer Maternal Uncle   . Colon cancer Neg Hx   . Esophageal cancer Neg Hx   . Rectal cancer Neg Hx   . Stomach cancer Neg Hx   . Colon polyps Neg Hx     SOCIAL HISTORY: Social History   Socioeconomic History  . Marital status: Widowed    Spouse name: Not on file  . Number of children: 1  . Years of education: Not on file  . Highest education level: Not on file  Occupational History  . Occupation: disabled    Comment: mental illness; seizures  Social Needs  . Financial resource strain: Not on file  . Food insecurity:    Worry: Not on file    Inability: Not on file  . Transportation needs:    Medical: Not on file    Non-medical: Not on file  Tobacco Use  . Smoking status: Current Every Day Smoker    Packs/day: 0.33    Years: 44.00    Pack years: 14.52    Types: Cigarettes  . Smokeless tobacco: Never Used  Substance and Sexual Activity  . Alcohol use:  Yes    Alcohol/week: 0.0 standard drinks    Comment: occasionally  . Drug use: Yes      Types: Marijuana, Cocaine  . Sexual activity: Not Currently    Birth control/protection: None    Comment: widow  Lifestyle  . Physical activity:    Days per week: Not on file    Minutes per session: Not on file  . Stress: Not on file  Relationships  . Social connections:    Talks on phone: Not on file    Gets together: Not on file    Attends religious service: Not on file    Active member of club or organization: Not on file    Attends meetings of clubs or organizations: Not on file    Relationship status: Not on file  . Intimate partner violence:    Fear of current or ex partner: Not on file    Emotionally abused: Not on file    Physically abused: Not on file    Forced sexual activity: Not on file  Other Topics Concern  . Not on file  Social History Narrative   Marital status:  Widowed since 2002.  Married x 16 years. + dating.  Moved from [place] to live with daughter in 2013.     Children:  One child/daughter (33); two grandchildren.      Lives: with daughter, grandchildren 2.  Does not drive due to epilepsy.      Employment:  Disability for schizoaffective disorder.      Tobacco: 1 ppd x since 8th grade.      Alcohol:  Social; rare drinking due to seizure medications.  Weekends.       Drugs: none; previous use of marijuana.  Previous iv drug use, cocaine.      Exercise: none      Seatbelt:  100%      Guns: none    REVIEW OF SYSTEMS: Constitutional: No fevers, chills, or sweats, no generalized fatigue, change in appetite Eyes: No visual changes, double vision, eye pain Ear, nose and throat: No hearing loss, ear pain, nasal congestion, sore throat Cardiovascular: No chest pain, palpitations Respiratory:  No shortness of breath at rest or with exertion, wheezes GastrointestinaI: No nausea, vomiting, diarrhea, abdominal pain, fecal incontinence Genitourinary:  No dysuria, urinary retention or frequency Musculoskeletal:  No neck pain,+back pain Integumentary: No  rash, pruritus, skin lesions Neurological: as above Psychiatric: No depression, insomnia, anxiety Endocrine: No palpitations, fatigue, diaphoresis, mood swings, change in appetite, change in weight, increased thirst Hematologic/Lymphatic:  No anemia, purpura, petechiae. Allergic/Immunologic: no itchy/runny eyes, nasal congestion, recent allergic reactions, rashes  PHYSICAL EXAM: Vitals:   07/27/18 0822  BP: 106/68  Pulse: 79  SpO2: 99%   General: No acute distress Head:  Normocephalic/atraumatic Neck: supple, no paraspinal tenderness, full range of motion Heart:  Regular rate and rhythm Lungs:  Clear to auscultation bilaterally Back: No paraspinal tenderness Skin/Extremities: No rash, no edema Neurological Exam: alert and oriented to person, place, and time. No aphasia or dysarthria. Fund of knowledge is appropriate.  Recent and remote memory are intact.  Attention and concentration are normal.    Able to name objects and repeat phrases. Cranial nerves: Pupils equal, round, reactive to light.  Extraocular movements intact with no nystagmus. Visual fields full. Facial sensation intact. No facial asymmetry. Tongue, uvula, palate midline.  Motor: Bulk and tone normal, muscle strength 5/5 throughout with no pronator drift.  Sensation to light touch intact.  No extinction to double  simultaneous stimulation.  Deep tendon reflexes 2+ throughout, toes downgoing.  Finger to nose testing intact.  Gait slow and cautious due to back pain with antalgic gait.   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 MRI brain and routine EEG unremarkable. She reports having a seizure in June 2019 in the setting of Tylenol overdose, there is no record or mention of this when she was admitted to [facility]. Prior to June, she states the last seizure was  over a year ago. She reports running out of Depakote 4 months ago. We discussed starting Lamotrigine immediate-release 25mg  BID x 2 weeks, then increase to 50mg  BID (Lamictal XR not covered by insurance). Side effects, including Kathreen Cosier syndrome, were discussed. She does not drive. She is also on Gabapentin for back pain. She was advised to let her Psychiatrist know about Lamotrigine initiation. Follow-up with Pain Management for back pain. She will follow-up in 6 months and knows to call for any changes.   Thank you for allowing me to participate in her care.  Please do not hesitate to call for any questions or concerns.  The duration of this appointment visit was 26 minutes of face-to-face time with the patient.  Greater than 50% of this time was spent in counseling, explanation of diagnosis, planning of further management, and coordination of care.   Ellouise Newer, M.D.   CC: Dr. Nolon Rod

## 2018-07-27 NOTE — Patient Instructions (Signed)
1. Start Lamictal 25mg : take 1 tablet twice a day for 2 weeks, then increase to 2 tablets twice a day and continue. Let us know if any issues with insurance  2. Continue all your other medications  3. Follow-up in 6 months, call for any changes  Seizure Precautions: 1. If medication has been prescribed for you to prevent seizures, take it exactly as directed.  Do not stop taking the medicine without talking to your doctor first, even if you have not had a seizure in a long time.   2. Avoid activities in which a seizure would cause danger to yourself or to others.  Don't operate dangerous machinery, swim alone, or climb in high or dangerous places, such as on ladders, roofs, or girders.  Do not drive unless your doctor says you may.  3. If you have any warning that you may have a seizure, lay down in a safe place where you can't hurt yourself.    4.  No driving for 6 months from last seizure, as per [facility].   Please refer to the following link on the [place] website for more information: http://[email or web address]/[email or web address]/[email or web address]   5.  Maintain good sleep hygiene. Avoid alcohol.  6.  Contact your doctor if you have any problems that may be related to the medicine you are taking.  7.  Call 911 and bring the patient back to the ED if:        A.  The seizure lasts longer than 5 minutes.       B.  The patient doesn't awaken shortly after the seizure  C.  The patient has new problems such as difficulty seeing, speaking or moving  D.  The patient was injured during the seizure  E.  The patient has a temperature over 102 F (39C)  F.  The patient vomited and now is having trouble breathing

## 2018-08-01 ENCOUNTER — Encounter: Payer: 59 | Admitting: Family Medicine

## 2018-08-01 NOTE — Progress Notes (Deleted)
No chief complaint on file.   HPI  4 review of systems  Past Medical History:  Diagnosis Date  . Allergy   . Alopecia   . Anxiety   . Arthritis   . Asthma   . Cataract    "mild"  . COPD (chronic obstructive pulmonary disease) ([place])   . Depression   . GERD (gastroesophageal reflux disease)    o cc- takes OTC if needed.  . Glaucoma   . Hepatitis C   . Hypertension    onset age 57.  . Iron deficiency anemia   . Migraine   . Schizoaffective disorder ([place])   . Sciatic pain   . Seizures ([place])    onset in childhood. 06-03-16- per pt 8 months agoGeneralized tonic-clonic.Last seizure couple of months ago- last sz 9-10 months ago per pt 10-12-2016  . Shortness of breath dyspnea   . Substance abuse ([place])   . Ulcer     Current Outpatient Medications  Medication Sig Dispense Refill  . acetaminophen (TYLENOL) 500 MG tablet Take 500 mg by mouth once.    [name] albuterol (VENTOLIN HFA) 108 (90 Base) MCG/ACT inhaler INHALE TWO PUFFS BY MOUTH EVERY 6 HOURS AS NEEDED FOR SHORTNESS OF BREATH 18 g 1  . amLODipine (NORVASC) 10 MG tablet Take 1 tablet (10 mg total) by mouth daily. 30 tablet 1  . budesonide-formoterol (SYMBICORT) 80-4.5 MCG/ACT inhaler Inhale 2 puffs into the lungs 2 (two) times daily. 3 Inhaler 1  . citalopram (CELEXA) 10 MG tablet Take 1 tablet (10 mg total) by mouth daily. 30 tablet 4  . diclofenac sodium (VOLTAREN) 1 % GEL Apply 2 g topically 4 (four) times daily. (Patient not taking: Reported on 07/09/2018) 1 Tube 0  . diphenhydrAMINE-APAP, sleep, (TYLENOL PM EXTRA STRENGTH PO) Take 500 mg by mouth once.     . ferrous sulfate 325 (65 FE) MG tablet Take 1 tablet (325 mg total) by mouth 2 (two) times daily with a meal. 106 tablet 1  . folic acid (FOLVITE) 1 MG tablet Take 1 tablet (1 mg total) by mouth daily. 30 tablet 2  . gabapentin (NEURONTIN) 400 MG capsule Take 1 capsule (400 mg total) by mouth 3 (three) times daily. 270 capsule 1  . ipratropium-albuterol (DUONEB) 0.5-2.5  (3) MG/3ML SOLN Take 3 mLs by nebulization every 4 (four) hours as needed (shortness of breath). Reported on 12/02/2015    . lamoTRIgine (LAMICTAL) 25 MG tablet Take 1 tablet twice a day for 2 weeks, then increase to 2 tablet twice a day and continue 120 tablet 11  . methocarbamol (ROBAXIN) 500 MG tablet Take 2 tablets (1,000 mg total) by mouth every 8 (eight) hours as needed for muscle spasms. 60 tablet 0  . methocarbamol (ROBAXIN) 750 MG tablet Take 1 tablet (750 mg total) by mouth 3 (three) times daily. 90 tablet 3  . Misc. Devices ([place]) MISC Sig: one rolling walker any name brand with ambulation. DDD lumbar spine 1 each 0  . Multiple Vitamin (MULTIVITAMIN WITH MINERALS) TABS tablet Take 1 tablet by mouth daily. 30 tablet 1  . nicotine (NICODERM CQ - DOSED IN MG/24 HOURS) 14 mg/24hr patch Place 1 patch (14 mg total) onto the skin daily. 28 patch 0  . ondansetron (ZOFRAN) 4 MG tablet Take 1 tablet (4 mg total) by mouth every 6 (six) hours as needed for nausea. 20 tablet 0  . oxyCODONE (OXY IR/ROXICODONE) 5 MG immediate release tablet Take 1 tablet (5 mg total) by  mouth every 4 (four) hours as needed for moderate pain. 15 tablet 0  . pantoprazole (PROTONIX) 40 MG tablet Take 1 tablet (40 mg total) by mouth 2 (two) times daily before a meal. 60 tablet 3  . polyethylene glycol (MIRALAX) packet Take 17 g by mouth daily. 30 each 3  . QUEtiapine (SEROQUEL) 50 MG tablet Take 3 tablets (150 mg total) by mouth at bedtime. 90 tablet 3  . senna-docusate (SENOKOT-S) 8.6-50 MG tablet Take 2 tablets by mouth at bedtime. 60 tablet 2  . thiamine 100 MG tablet Take 1 tablet (100 mg total) by mouth daily. 30 tablet 2  . triamcinolone cream (KENALOG) 0.1 % APPLY  CREAM EXTERNALLY TWICE DAILY (Patient not taking: Reported on 07/09/2018) 30 g 0  . valACYclovir (VALTREX) 1000 MG tablet Take 2 tabs po at symptom onset, repeat in 12 hours 20 tablet 3   No current facility-administered medications for this  visit.     Allergies:  Allergies  Allergen Reactions  . Fruit & Vegetable Daily [Nutritional Supplements] Shortness Of Breath    Aloe  . Iodinated Diagnostic Agents Swelling    Swelling and itching of left side of her face only after CT SI injection(no steroid used)  . Aloe Vera Rash    Past Surgical History:  Procedure Laterality Date  . ABDOMINAL HYSTERECTOMY     ovarian cyst B, cervical dysplasia, fibroids.   Ovaries intact.  [name] BIOPSY  07/11/2018   Procedure: BIOPSY;  Surgeon: Yetta Flock, MD;  Location: [facility];  Service: Gastroenterology;;  . COLONOSCOPY    . COLONOSCOPY W/ BIOPSIES    . DILATION AND CURETTAGE OF UTERUS     x2  . ESOPHAGOGASTRODUODENOSCOPY (EGD) WITH PROPOFOL N/A 07/11/2018   Procedure: ESOPHAGOGASTRODUODENOSCOPY (EGD) WITH PROPOFOL;  Surgeon: Yetta Flock, MD;  [facility];  Service: Gastroenterology;  Laterality: N/A;  . EXPLORATORY LAPAROTOMY     x3  . GYNECOLOGIC CRYOSURGERY     x3  . HOT HEMOSTASIS N/A 07/11/2018   Procedure: HOT HEMOSTASIS (ARGON PLASMA COAGULATION/BICAP);  Surgeon: Yetta Flock, MD;  Location: [facility];  Service: Gastroenterology;  Laterality: N/A;  . MANDIBLE RECONSTRUCTION N/A 06/27/2015   Procedure: REMOVAL MAXILLARY PALATAL TORUS.  REMOVAL MANDIBULAR TORUS AND EXOSTOSIS.  ;  Surgeon: Diona Browner, DDS;  Location: [facility];  Service: Oral Surgery;  Laterality: N/A;  . OVARIAN CYST REMOVAL  2008  . POLYPECTOMY      Social History   Socioeconomic History  . Marital status: Widowed    Spouse name: Not on file  . Number of children: 1  . Years of education: Not on file  . Highest education level: Not on file  Occupational History  . Occupation: disabled    Comment: mental illness; seizures  Social Needs  . Financial resource strain: Not on file  . Food insecurity:    Worry: Not on file    Inability: Not on file  . Transportation needs:    Medical: Not on file    Non-medical: Not  on file  Tobacco Use  . Smoking status: Current Every Day Smoker    Packs/day: 0.33    Years: 44.00    Pack years: 14.52    Types: Cigarettes  . Smokeless tobacco: Never Used  Substance and Sexual Activity  . Alcohol use: Yes    Alcohol/week: 0.0 standard drinks    Comment: occasionally  . Drug use: Yes    Types: Marijuana, Cocaine  . Sexual activity: Not  Currently    Birth control/protection: None    Comment: widow  Lifestyle  . Physical activity:    Days per week: Not on file    Minutes per session: Not on file  . Stress: Not on file  Relationships  . Social connections:    Talks on phone: Not on file    Gets together: Not on file    Attends religious service: Not on file    Active member of club or organization: Not on file    Attends meetings of clubs or organizations: Not on file    Relationship status: Not on file  Other Topics Concern  . Not on file  Social History Narrative   Marital status:  Widowed since 2002.  Married x 16 years. + dating.  Moved from [place] to live with daughter in 2013.     Children:  One child/daughter (33); two grandchildren.      Lives: with daughter, grandchildren 2.  Does not drive due to epilepsy.      Employment:  Disability for schizoaffective disorder.      Tobacco: 1 ppd x since 8th grade.      Alcohol:  Social; rare drinking due to seizure medications.  Weekends.       Drugs: none; previous use of marijuana.  Previous iv drug use, cocaine.      Exercise: none      Seatbelt:  100%      Guns: none    Family History  Problem Relation Age of Onset  . Diabetes type II Mother   . Hypertension Mother   . Arthritis Mother   . Diabetes Mother   . Mental illness Mother        bipolar  . Diabetes type II Maternal Aunt   . Hypertension Father   . Heart murmur Father   . Arthritis Father   . Stroke Father   . Alopecia Sister   . Alopecia Brother   . Alopecia Sister   . Mental retardation Sister        depression  .  Prostate cancer Paternal Grandfather   . Cancer Maternal Uncle   . Colon cancer Neg Hx   . Esophageal cancer Neg Hx   . Rectal cancer Neg Hx   . Stomach cancer Neg Hx   . Colon polyps Neg Hx      ROS Review of Systems See HPI Constitution: No fevers or chills No malaise No diaphoresis Skin: No rash or itching Eyes: no blurry vision, no double vision GU: no dysuria or hematuria Neuro: no dizziness or headaches * all others reviewed and negative   Objective: There were no vitals filed for this visit.  Physical Exam  Assessment and Plan There are no diagnoses linked to this encounter.   Melissa Bridges

## 2018-08-03 ENCOUNTER — Telehealth: Payer: Self-pay

## 2018-08-03 NOTE — Telephone Encounter (Signed)
-----   Message from Roetta Sessions,  sent at 07/13/2018  1:12 PM EDT ----- Regarding: cbc due Pt due for cbc after 08-03-18.  Pt has been in inpatient rehab. Order in. Call to see if she is able to go by lab.

## 2018-08-03 NOTE — Telephone Encounter (Signed)
Unable to leave a message for pt as VM at home and on cell are full and not accepting messages.  Letter sent asking her to go to have lab work done. Pt has MyChart

## 2018-08-09 ENCOUNTER — Inpatient Hospital Stay: Payer: 59 | Admitting: Family Medicine

## 2018-08-10 ENCOUNTER — Ambulatory Visit (HOSPITAL_COMMUNITY): Payer: Self-pay | Admitting: Psychiatry

## 2018-08-10 NOTE — Telephone Encounter (Signed)
Please give verbal order. For more hours of Aid

## 2018-08-11 NOTE — Telephone Encounter (Signed)
Spoke with Melissa Bridges at [facility] and she advises they don't take verbal orders over the phone.  She will fax over the form for independent assessment for personal care services form with the instructions on how to complete form. Will place these forms in dr Nolon Rod box for completion.

## 2018-08-24 ENCOUNTER — Telehealth: Payer: Self-pay

## 2018-08-24 NOTE — Telephone Encounter (Signed)
Faxed Independent Assessment for Personal Care Services (PCS) Attestation of Medical Need to [phone]. Dgaddy, CMA

## 2018-08-28 ENCOUNTER — Telehealth: Payer: Self-pay | Admitting: Family Medicine

## 2018-08-28 ENCOUNTER — Other Ambulatory Visit: Payer: Self-pay

## 2018-08-28 ENCOUNTER — Encounter: Payer: Self-pay | Admitting: Family Medicine

## 2018-08-28 ENCOUNTER — Ambulatory Visit (INDEPENDENT_AMBULATORY_CARE_PROVIDER_SITE_OTHER): Payer: 59 | Admitting: Family Medicine

## 2018-08-28 VITALS — BP 143/85 | HR 100 | Temp 98.1°F | Resp 17 | Ht 63.75 in | Wt 112.0 lb

## 2018-08-28 DIAGNOSIS — K921 Melena: Secondary | ICD-10-CM

## 2018-08-28 DIAGNOSIS — K449 Diaphragmatic hernia without obstruction or gangrene: Secondary | ICD-10-CM | POA: Diagnosis not present

## 2018-08-28 DIAGNOSIS — D509 Iron deficiency anemia, unspecified: Secondary | ICD-10-CM

## 2018-08-28 DIAGNOSIS — M2550 Pain in unspecified joint: Secondary | ICD-10-CM

## 2018-08-28 DIAGNOSIS — K31819 Angiodysplasia of stomach and duodenum without bleeding: Secondary | ICD-10-CM

## 2018-08-28 DIAGNOSIS — R1013 Epigastric pain: Secondary | ICD-10-CM

## 2018-08-28 DIAGNOSIS — T391X1D Poisoning by 4-Aminophenol derivatives, accidental (unintentional), subsequent encounter: Secondary | ICD-10-CM

## 2018-08-28 DIAGNOSIS — F250 Schizoaffective disorder, bipolar type: Secondary | ICD-10-CM

## 2018-08-28 DIAGNOSIS — G40909 Epilepsy, unspecified, not intractable, without status epilepticus: Secondary | ICD-10-CM

## 2018-08-28 NOTE — Patient Instructions (Signed)
     If you have lab work done today you will be contacted with your lab results within the next 2 weeks.  If you have not heard from us then please contact us. The fastest way to get your results is to register for My Chart.   IF you received an x-ray today, you will receive an invoice from [facility]. Please contact [facility] at [phone] with questions or concerns regarding your invoice.   IF you received labwork today, you will receive an invoice from [facility]. Please contact [facility] at [phone] with questions or concerns regarding your invoice.   Our billing staff will not be able to assist you with questions regarding bills from these companies.  You will be contacted with the lab results as soon as they are available. The fastest way to get your results is to activate your My Chart account. Instructions are located on the last page of this paperwork. If you have not heard from us regarding the results in 2 weeks, please contact this office.    We recommend that you schedule a mammogram for breast cancer screening. Typically, you do not need a referral to do this. Please contact a local imaging center to schedule your mammogram.  [facility] - [phone]  *ask for the Radiology Department [facility]) - [phone] or [phone]  [facility] - [phone] [facility] - [phone] [facility] - [phone]  *ask for the Radiology Department [facility] - [phone]  *ask for the Radiology Department [facility] - [phone]  *ask for the Mammography Department [facility] - [phone] 

## 2018-08-28 NOTE — Telephone Encounter (Signed)
oxycodone refill Last Refill:07/15/18 # 15 Last OV: prescribed for pt at discharge PCP: Dr Nolon Rod Pharmacy:[facility] [address]

## 2018-08-28 NOTE — Telephone Encounter (Signed)
Copied from [place] [phone]. Topic: Quick Communication - Rx Refill/Question >> Aug 28, 2018  2:50 PM Gardiner Ramus wrote: Medication:oxyCODONE (OXY IR/ROXICODONE) 5 MG immediate release tablet [[id number]]  Has the patient contacted their pharmacy? no Preferred Pharmacy (with phone number or street name):[place], [place] [phone] (Phone) [phone] (Fax)   Agent: Please be advised that RX refills may take up to 3 business days. We ask that you follow-up with your pharmacy.

## 2018-08-28 NOTE — Progress Notes (Signed)
Chief Complaint  Patient presents with  . Hospitalization Follow-up    pt needs refill on oxycodone and flexeril.  Per pt can't take tylenol, asa or ibuprofen    HPI   Patient here for follow up from  Hospitalization for unintentional Tylenol overdose.  She was admitted on 07/09/18 and discharged home on 07/15/18 She has a history of sz do as well as polysubstance abuse involving cocaine use.  Since her discharge she has followed up with Neurology and GI  Hiatal Hernia/Anemia/Hematochezia Her EGD showed a hiatal hernia  She takes protonix  She has reflux type symptoms She was taking tylenol for back pain and took too many Since discontinuing the tylenol she has not seen any blood in her stools She has anemia and was given iron to improve her levels   Seizure disorder She was restarted on lamictal for her recent seizure in the setting of tylenol overdose.  She was previously noncompliant with depakote.  She sees Psychiatry as well and pain clinic  She is on Gabapentin for her chronic back pain.  She was referred to Rheumatology to see if her chronic joint pains are due to RA She states that the gabapentin was not helping  Schizoaffective Disorder She has schizoaffective disorder She also has a Psychiatrist She takes seroquel and celexa She resorted to cocaine to help with the pain  She denies suicidal thoughts   Past Medical History:  Diagnosis Date  . Allergy   . Alopecia   . Anxiety   . Arthritis   . Asthma   . Cataract    "mild"  . COPD (chronic obstructive pulmonary disease) ([place])   . Depression   . GERD (gastroesophageal reflux disease)    o cc- takes OTC if needed.  . Glaucoma   . Hepatitis C   . Hypertension    onset age 32.  . Iron deficiency anemia   . Migraine   . Schizoaffective disorder ([place])   . Sciatic pain   . Seizures ([place])    onset in childhood. 06-03-16- per pt 8 months agoGeneralized tonic-clonic.Last seizure couple of months ago- last sz 9-10  months ago per pt 10-12-2016  . Shortness of breath dyspnea   . Substance abuse ([place])   . Ulcer     Current Outpatient Medications  Medication Sig Dispense Refill  . acetaminophen (TYLENOL) 500 MG tablet Take 500 mg by mouth once.    [name] albuterol (VENTOLIN HFA) 108 (90 Base) MCG/ACT inhaler INHALE TWO PUFFS BY MOUTH EVERY 6 HOURS AS NEEDED FOR SHORTNESS OF BREATH 18 g 1  . amLODipine (NORVASC) 10 MG tablet Take 1 tablet (10 mg total) by mouth daily. 30 tablet 1  . budesonide-formoterol (SYMBICORT) 80-4.5 MCG/ACT inhaler Inhale 2 puffs into the lungs 2 (two) times daily. 3 Inhaler 1  . citalopram (CELEXA) 10 MG tablet Take 1 tablet (10 mg total) by mouth daily. 30 tablet 4  . diphenhydrAMINE-APAP, sleep, (TYLENOL PM EXTRA STRENGTH PO) Take 500 mg by mouth once.     . ferrous sulfate 325 (65 FE) MG tablet Take 1 tablet (325 mg total) by mouth 2 (two) times daily with a meal. 854 tablet 1  . folic acid (FOLVITE) 1 MG tablet Take 1 tablet (1 mg total) by mouth daily. 30 tablet 2  . gabapentin (NEURONTIN) 400 MG capsule Take 1 capsule (400 mg total) by mouth 3 (three) times daily. 270 capsule 1  . ipratropium-albuterol (DUONEB) 0.5-2.5 (3) MG/3ML SOLN Take 3 mLs by  nebulization every 4 (four) hours as needed (shortness of breath). Reported on 12/02/2015    . lamoTRIgine (LAMICTAL) 25 MG tablet Take 1 tablet twice a day for 2 weeks, then increase to 2 tablet twice a day and continue 120 tablet 11  . methocarbamol (ROBAXIN) 500 MG tablet Take 2 tablets (1,000 mg total) by mouth every 8 (eight) hours as needed for muscle spasms. 60 tablet 0  . methocarbamol (ROBAXIN) 750 MG tablet Take 1 tablet (750 mg total) by mouth 3 (three) times daily. 90 tablet 3  . Misc. Devices ([place]) MISC Sig: one rolling walker any name brand with ambulation. DDD lumbar spine 1 each 0  . Multiple Vitamin (MULTIVITAMIN WITH MINERALS) TABS tablet Take 1 tablet by mouth daily. 30 tablet 1  . nicotine (NICODERM CQ  - DOSED IN MG/24 HOURS) 14 mg/24hr patch Place 1 patch (14 mg total) onto the skin daily. 28 patch 0  . ondansetron (ZOFRAN) 4 MG tablet Take 1 tablet (4 mg total) by mouth every 6 (six) hours as needed for nausea. 20 tablet 0  . oxyCODONE (OXY IR/ROXICODONE) 5 MG immediate release tablet Take 1 tablet (5 mg total) by mouth every 4 (four) hours as needed for moderate pain. 15 tablet 0  . pantoprazole (PROTONIX) 40 MG tablet Take 1 tablet (40 mg total) by mouth 2 (two) times daily before a meal. 60 tablet 3  . polyethylene glycol (MIRALAX) packet Take 17 g by mouth daily. 30 each 3  . QUEtiapine (SEROQUEL) 50 MG tablet Take 3 tablets (150 mg total) by mouth at bedtime. 90 tablet 3  . senna-docusate (SENOKOT-S) 8.6-50 MG tablet Take 2 tablets by mouth at bedtime. 60 tablet 2  . thiamine 100 MG tablet Take 1 tablet (100 mg total) by mouth daily. 30 tablet 2  . triamcinolone cream (KENALOG) 0.1 % APPLY  CREAM EXTERNALLY TWICE DAILY 30 g 0  . valACYclovir (VALTREX) 1000 MG tablet Take 2 tabs po at symptom onset, repeat in 12 hours 20 tablet 3  . diclofenac sodium (VOLTAREN) 1 % GEL Apply 2 g topically 4 (four) times daily. (Patient not taking: Reported on 08/28/2018) 1 Tube 0   No current facility-administered medications for this visit.     Allergies:  Allergies  Allergen Reactions  . Fruit & Vegetable Daily [Nutritional Supplements] Shortness Of Breath    Aloe  . Iodinated Diagnostic Agents Swelling    Swelling and itching of left side of her face only after CT SI injection(no steroid used)  . Aloe Vera Rash    Past Surgical History:  Procedure Laterality Date  . ABDOMINAL HYSTERECTOMY     ovarian cyst B, cervical dysplasia, fibroids.   Ovaries intact.  [name] BIOPSY  07/11/2018   Procedure: BIOPSY;  Surgeon: Yetta Flock, MD;  [facility];  Service: Gastroenterology;;  . COLONOSCOPY    . COLONOSCOPY W/ BIOPSIES    . DILATION AND CURETTAGE OF UTERUS     x2  .  ESOPHAGOGASTRODUODENOSCOPY (EGD) WITH PROPOFOL N/A 07/11/2018   Procedure: ESOPHAGOGASTRODUODENOSCOPY (EGD) WITH PROPOFOL;  Surgeon: Yetta Flock, MD;  [facility];  Service: Gastroenterology;  Laterality: N/A;  . EXPLORATORY LAPAROTOMY     x3  . GYNECOLOGIC CRYOSURGERY     x3  . HOT HEMOSTASIS N/A 07/11/2018   Procedure: HOT HEMOSTASIS (ARGON PLASMA COAGULATION/BICAP);  Surgeon: Yetta Flock, MD;  Location: [facility];  Service: Gastroenterology;  Laterality: N/A;  . MANDIBLE RECONSTRUCTION N/A 06/27/2015  Procedure: REMOVAL MAXILLARY PALATAL TORUS.  REMOVAL MANDIBULAR TORUS AND EXOSTOSIS.  ;  Surgeon: Diona Browner, DDS;  Location: [facility];  Service: Oral Surgery;  Laterality: N/A;  . OVARIAN CYST REMOVAL  2008  . POLYPECTOMY      Social History   Socioeconomic History  . Marital status: Widowed    Spouse name: Not on file  . Number of children: 1  . Years of education: Not on file  . Highest education level: Not on file  Occupational History  . Occupation: disabled    Comment: mental illness; seizures  Social Needs  . Financial resource strain: Not on file  . Food insecurity:    Worry: Not on file    Inability: Not on file  . Transportation needs:    Medical: Not on file    Non-medical: Not on file  Tobacco Use  . Smoking status: Current Every Day Smoker    Packs/day: 0.33    Years: 44.00    Pack years: 14.52    Types: Cigarettes  . Smokeless tobacco: Never Used  Substance and Sexual Activity  . Alcohol use: Yes    Alcohol/week: 0.0 standard drinks    Comment: occasionally  . Drug use: Yes    Types: Marijuana, Cocaine  . Sexual activity: Not Currently    Birth control/protection: None    Comment: widow  Lifestyle  . Physical activity:    Days per week: Not on file    Minutes per session: Not on file  . Stress: Not on file  Relationships  . Social connections:    Talks on phone: Not on file    Gets together: Not on file    Attends  religious service: Not on file    Active member of club or organization: Not on file    Attends meetings of clubs or organizations: Not on file    Relationship status: Not on file  Other Topics Concern  . Not on file  Social History Narrative   Marital status:  Widowed since 2002.  Married x 16 years. + dating.  Moved from [place] to live with daughter in 2013.     Children:  One child/daughter (33); two grandchildren.      Lives: with daughter, grandchildren 2.  Does not drive due to epilepsy.      Employment:  Disability for schizoaffective disorder.      Tobacco: 1 ppd x since 8th grade.      Alcohol:  Social; rare drinking due to seizure medications.  Weekends.       Drugs: none; previous use of marijuana.  Previous iv drug use, cocaine.      Exercise: none      Seatbelt:  100%      Guns: none    Family History  Problem Relation Age of Onset  . Diabetes type II Mother   . Hypertension Mother   . Arthritis Mother   . Diabetes Mother   . Mental illness Mother        bipolar  . Diabetes type II Maternal Aunt   . Hypertension Father   . Heart murmur Father   . Arthritis Father   . Stroke Father   . Alopecia Sister   . Alopecia Brother   . Alopecia Sister   . Mental retardation Sister        depression  . Prostate cancer Paternal Grandfather   . Cancer Maternal Uncle   . Colon cancer Neg Hx   . Esophageal  cancer Neg Hx   . Rectal cancer Neg Hx   . Stomach cancer Neg Hx   . Colon polyps Neg Hx      ROS Review of Systems See HPI Constitution: No fevers or chills No malaise No diaphoresis Skin: No rash or itching Eyes: no blurry vision, no double vision GU: no dysuria or hematuria Neuro: no dizziness or headaches all others reviewed and negative   Objective: Vitals:   08/28/18 1046  BP: (!) 143/85  Pulse: 100  Resp: 17  Temp: 98.1 F (36.7 C)  TempSrc: Oral  SpO2: 99%  Weight: 112 lb (50.8 kg)  Height: 5' 3.75" (1.619 m)    Physical Exam    Constitutional: She is oriented to person, place, and time. She appears well-developed and well-nourished.  HENT:  Head: Normocephalic and atraumatic.  Eyes: Conjunctivae and EOM are normal.  Neck: Normal range of motion. Neck supple.  Cardiovascular: Normal rate, regular rhythm and normal heart sounds.  Pulmonary/Chest: Effort normal and breath sounds normal. No stridor. No respiratory distress. She has no wheezes. She has no rales.  Abdominal: Soft. Bowel sounds are normal. She exhibits no distension and no mass. There is no tenderness. There is no guarding.  No palpable hernia, no tenderness noted although patient points to her LLQ as an area of soreness.  Neurological: She is alert and oriented to person, place, and time.  Skin: Skin is warm. Capillary refill takes less than 2 seconds.  Psychiatric: She has a normal mood and affect. Her behavior is normal. Judgment and thought content normal.    Assessment and Plan Melissa Bridges was seen today for hospitalization follow-up.  Diagnoses and all orders for this visit:  Diaphragmatic hernia without obstruction or gangrene Abdominal pain, epigastric Hematochezia -   Continue protonix  Angiodysplasia of stomach and duodenum -     CBC with Differential/Platelet  Microcytic hypochromic anemia -     CBC with Differential/Platelet  Polyarthralgia- will check levels and forward to Rheumatology  [facility] Melissa Rocker, MD [address], [place], [place] [address] [phone] Fax: [phone]  -     CYCLIC CITRUL PEPTIDE ANTIBODY, IGG/IGA -     Sedimentation Rate -     ANA w/Reflex if Positive -     Rheumatoid factor -     VITAMIN D 25 Hydroxy (Vit-D Deficiency, Fractures) -     CK -     C-reactive protein  Seizure disorder ([place]) -  Reviewed Neurology notes, pt to continue her lamictal -     Comprehensive metabolic panel  Unintentional Tylenol overdose, subsequent encounter- advised to continue  with Rheumatology, discussed that she should avoid tylenol Will recheck liver enzymes  -     Comprehensive metabolic panel  Schizoaffective Disorder - pt plans to follow up with Psychiatry    [place]

## 2018-08-29 ENCOUNTER — Encounter: Payer: Self-pay | Admitting: Gastroenterology

## 2018-08-29 ENCOUNTER — Ambulatory Visit (INDEPENDENT_AMBULATORY_CARE_PROVIDER_SITE_OTHER): Payer: 59 | Admitting: Gastroenterology

## 2018-08-29 VITALS — BP 140/80 | HR 72 | Ht 63.75 in | Wt 111.5 lb

## 2018-08-29 DIAGNOSIS — R1032 Left lower quadrant pain: Secondary | ICD-10-CM | POA: Diagnosis not present

## 2018-08-29 DIAGNOSIS — B1920 Unspecified viral hepatitis C without hepatic coma: Secondary | ICD-10-CM | POA: Diagnosis not present

## 2018-08-29 DIAGNOSIS — M549 Dorsalgia, unspecified: Secondary | ICD-10-CM

## 2018-08-29 DIAGNOSIS — D509 Iron deficiency anemia, unspecified: Secondary | ICD-10-CM | POA: Diagnosis not present

## 2018-08-29 DIAGNOSIS — Z86010 Personal history of colonic polyps: Secondary | ICD-10-CM

## 2018-08-29 LAB — COMPREHENSIVE METABOLIC PANEL
ALBUMIN: 4.7 g/dL (ref 3.5–5.5)
ALK PHOS: 116 IU/L (ref 39–117)
ALT: 68 IU/L — AB (ref 0–32)
AST: 87 IU/L — ABNORMAL HIGH (ref 0–40)
Albumin/Globulin Ratio: 1.4 (ref 1.2–2.2)
BUN / CREAT RATIO: 17 (ref 9–23)
BUN: 14 mg/dL (ref 6–24)
Bilirubin Total: 0.5 mg/dL (ref 0.0–1.2)
CHLORIDE: 103 mmol/L (ref 96–106)
CO2: 23 mmol/L (ref 20–29)
Calcium: 10.3 mg/dL — ABNORMAL HIGH (ref 8.7–10.2)
Creatinine, Ser: 0.82 mg/dL (ref 0.57–1.00)
GFR calc Af Amer: 91 mL/min/{1.73_m2} (ref >59)
GFR calc non Af Amer: 79 mL/min/{1.73_m2} (ref >59)
GLUCOSE: 96 mg/dL (ref 65–99)
Globulin, Total: 3.3 g/dL (ref 1.5–4.5)
Potassium: 3.8 mmol/L (ref 3.5–5.2)
Sodium: 141 mmol/L (ref 134–144)
Total Protein: 8 g/dL (ref 6.0–8.5)

## 2018-08-29 LAB — CBC WITH DIFFERENTIAL/PLATELET
BASOS ABS: 0 10*3/uL (ref 0.0–0.2)
Basos: 1 %
EOS (ABSOLUTE): 0.1 10*3/uL (ref 0.0–0.4)
EOS: 1 %
HEMATOCRIT: 39.2 % (ref 34.0–46.6)
HEMOGLOBIN: 13.3 g/dL (ref 11.1–15.9)
IMMATURE GRANULOCYTES: 0 %
Immature Grans (Abs): 0 10*3/uL (ref 0.0–0.1)
LYMPHS: 19 %
Lymphocytes Absolute: 1.1 10*3/uL (ref 0.7–3.1)
MCH: 34.9 pg — ABNORMAL HIGH (ref 26.6–33.0)
MCHC: 33.9 g/dL (ref 31.5–35.7)
MCV: 103 fL — ABNORMAL HIGH (ref 79–97)
MONOCYTES: 11 %
Monocytes Absolute: 0.6 10*3/uL (ref 0.1–0.9)
NEUTROS PCT: 68 %
Neutrophils Absolute: 4.1 10*3/uL (ref 1.4–7.0)
Platelets: 195 10*3/uL (ref 150–450)
RBC: 3.81 x10E6/uL (ref 3.77–5.28)
RDW: 19.3 % — ABNORMAL HIGH (ref 12.3–15.4)
WBC: 6 10*3/uL (ref 3.4–10.8)

## 2018-08-29 LAB — RHEUMATOID FACTOR: Rhuematoid fact SerPl-aCnc: 32.5 IU/mL — ABNORMAL HIGH (ref 0.0–13.9)

## 2018-08-29 LAB — C-REACTIVE PROTEIN

## 2018-08-29 LAB — CYCLIC CITRUL PEPTIDE ANTIBODY, IGG/IGA: CYCLIC CITRULLIN PEPTIDE AB: 8 U (ref 0–19)

## 2018-08-29 LAB — ANA W/REFLEX IF POSITIVE: Anti Nuclear Antibody(ANA): NEGATIVE

## 2018-08-29 LAB — VITAMIN D 25 HYDROXY (VIT D DEFICIENCY, FRACTURES): VIT D 25 HYDROXY: 21.8 ng/mL — AB (ref 30.0–100.0)

## 2018-08-29 LAB — SEDIMENTATION RATE: Sed Rate: 37 mm/hr (ref 0–40)

## 2018-08-29 LAB — CK: Total CK: 182 U/L — ABNORMAL HIGH (ref 24–173)

## 2018-08-29 NOTE — Progress Notes (Signed)
HPI :  57 year old female here for a follow-up visit.  She was admitted to the hospital this past July for Tylenol overdose. Fortunately she did not have any liver damage from this. She is also noted to have severe iron deficiency anemia at the time, hemoglobin nadired to 8.0 with low iron studies in the setting of dark stools. 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 Biopsies of her stomach were negative for H. Pylori. She had a previous colonoscopy in 2017 showing a large sessile serrated adenoma which was removed, with a follow-up flexible sigmoidoscopy showing no residual polyp. She was placed on oral iron and her blood count was recently repeated and her hemoglobin is up to 13.3. She denies any problems with her bowels at this time. No blood in the stools.she reports her bowel habits are stable while on a stool softener.  Of note she has a history of hepatitis C. She was previously evaluated by infectious disease for therapy for this in 2016 but did not follow-up for treatment. Last cross-sectional imaging of her liver was noncontrast CT scanin June which showed no evidence of cirrhosis. She has ongoing mild AST and ALT elevation. She drinks alcohol rarely. She had an ultrasound with elastography in 2016 showing F2 to 3 changes.  She otherwise has ongoing problems with sciatica and pain in her left groin/hip area. She reports pain that is worse with movements and her left groin. She was previously taking narcotics for this issue however her primary care changed and she is trying to get all again again with pain management.   EGD 07/11/2018 - 2cm HH, 2 gastric AVMs coagulated, duodenal AVM ablated - , gastric biopsies are HP negative,  iron deficiency  Colonoscopy 06/03/2016 - large rectosigmoid colon polyp - large sessile serrated adenoma, hemorrhoids Flex sig 10/26/2016 - tattoo without residual polyp - due for recall  10/2019  Past Medical History:  Diagnosis Date  . Allergy   . Alopecia   . Anxiety   . Arthritis   . Asthma   . Cataract    "mild"  . COPD (chronic obstructive pulmonary disease) ([place])   . Depression   . GERD (gastroesophageal reflux disease)    o cc- takes OTC if needed.  . Glaucoma   . Hepatitis C   . Hypertension    onset age 65.  . Iron deficiency anemia   . Migraine   . Schizoaffective disorder ([place])   . Sciatic pain   . Seizures ([place])    onset in childhood. 06-03-16- per pt 8 months agoGeneralized tonic-clonic.Last seizure couple of months ago- last sz 9-10 months ago per pt 10-12-2016  . Shortness of breath dyspnea   . Substance abuse ([place])   . Ulcer      Past Surgical History:  Procedure Laterality Date  . ABDOMINAL HYSTERECTOMY     ovarian cyst B, cervical dysplasia, fibroids.   Ovaries intact.  [name] BIOPSY  07/11/2018   Procedure: BIOPSY;  Surgeon: Yetta Flock, MD;  Location: [facility];  Service: Gastroenterology;;  . COLONOSCOPY    . COLONOSCOPY W/ BIOPSIES    . DILATION AND CURETTAGE OF UTERUS     x2  . ESOPHAGOGASTRODUODENOSCOPY (EGD) WITH PROPOFOL N/A 07/11/2018   Procedure: ESOPHAGOGASTRODUODENOSCOPY (EGD) WITH PROPOFOL;  Surgeon: Yetta Flock, MD;  Location: [facility];  Service: Gastroenterology;  Laterality: N/A;  . EXPLORATORY LAPAROTOMY     x3  .  GYNECOLOGIC CRYOSURGERY     x3  . HOT HEMOSTASIS N/A 07/11/2018   Procedure: HOT HEMOSTASIS (ARGON PLASMA COAGULATION/BICAP);  Surgeon: Yetta Flock, MD;  Location: [facility];  Service: Gastroenterology;  Laterality: N/A;  . MANDIBLE RECONSTRUCTION N/A 06/27/2015   Procedure: REMOVAL MAXILLARY PALATAL TORUS.  REMOVAL MANDIBULAR TORUS AND EXOSTOSIS.  ;  Surgeon: Diona Browner, DDS;  Location: [facility];  Service: Oral Surgery;  Laterality: N/A;  . OVARIAN CYST REMOVAL  2008  . POLYPECTOMY     Family History  Problem Relation Age of Onset  . Diabetes type II Mother   .  Hypertension Mother   . Arthritis Mother   . Diabetes Mother   . Mental illness Mother        bipolar  . Diabetes type II Maternal Aunt   . Hypertension Father   . Heart murmur Father   . Arthritis Father   . Stroke Father   . Alopecia Sister   . Alopecia Brother   . Alopecia Sister   . Mental retardation Sister        depression  . Prostate cancer Paternal Grandfather   . Cancer Maternal Uncle   . Colon cancer Neg Hx   . Esophageal cancer Neg Hx   . Rectal cancer Neg Hx   . Stomach cancer Neg Hx   . Colon polyps Neg Hx    Social History   Tobacco Use  . Smoking status: Current Every Day Smoker    Packs/day: 0.33    Years: 44.00    Pack years: 14.52    Types: Cigarettes  . Smokeless tobacco: Never Used  Substance Use Topics  . Alcohol use: Yes    Alcohol/week: 0.0 standard drinks    Comment: occasionally  . Drug use: Yes    Types: Marijuana, Cocaine   Current Outpatient Medications  Medication Sig Dispense Refill  . acetaminophen (TYLENOL) 500 MG tablet Take 500 mg by mouth once.    [name] albuterol (VENTOLIN HFA) 108 (90 Base) MCG/ACT inhaler INHALE TWO PUFFS BY MOUTH EVERY 6 HOURS AS NEEDED FOR SHORTNESS OF BREATH 18 g 1  . amLODipine (NORVASC) 10 MG tablet Take 1 tablet (10 mg total) by mouth daily. 30 tablet 1  . budesonide-formoterol (SYMBICORT) 80-4.5 MCG/ACT inhaler Inhale 2 puffs into the lungs 2 (two) times daily. 3 Inhaler 1  . citalopram (CELEXA) 10 MG tablet Take 1 tablet (10 mg total) by mouth daily. 30 tablet 4  . diclofenac sodium (VOLTAREN) 1 % GEL Apply 2 g topically 4 (four) times daily. 1 Tube 0  . diphenhydrAMINE-APAP, sleep, (TYLENOL PM EXTRA STRENGTH PO) Take 500 mg by mouth once.     . ferrous sulfate 325 (65 FE) MG tablet Take 1 tablet (325 mg total) by mouth 2 (two) times daily with a meal. 875 tablet 1  . folic acid (FOLVITE) 1 MG tablet Take 1 tablet (1 mg total) by mouth daily. 30 tablet 2  . gabapentin (NEURONTIN) 400 MG capsule Take 1 capsule  (400 mg total) by mouth 3 (three) times daily. 270 capsule 1  . ipratropium-albuterol (DUONEB) 0.5-2.5 (3) MG/3ML SOLN Take 3 mLs by nebulization every 4 (four) hours as needed (shortness of breath). Reported on 12/02/2015    . lamoTRIgine (LAMICTAL) 25 MG tablet Take 1 tablet twice a day for 2 weeks, then increase to 2 tablet twice a day and continue 120 tablet 11  . methocarbamol (ROBAXIN) 500 MG tablet Take 2 tablets (1,000 mg total) by mouth  every 8 (eight) hours as needed for muscle spasms. 60 tablet 0  . methocarbamol (ROBAXIN) 750 MG tablet Take 1 tablet (750 mg total) by mouth 3 (three) times daily. 90 tablet 3  . Misc. Devices ([place]) MISC Sig: one rolling walker any name brand with ambulation. DDD lumbar spine 1 each 0  . Multiple Vitamin (MULTIVITAMIN WITH MINERALS) TABS tablet Take 1 tablet by mouth daily. 30 tablet 1  . nicotine (NICODERM CQ - DOSED IN MG/24 HOURS) 14 mg/24hr patch Place 1 patch (14 mg total) onto the skin daily. 28 patch 0  . ondansetron (ZOFRAN) 4 MG tablet Take 1 tablet (4 mg total) by mouth every 6 (six) hours as needed for nausea. 20 tablet 0  . pantoprazole (PROTONIX) 40 MG tablet Take 1 tablet (40 mg total) by mouth 2 (two) times daily before a meal. 60 tablet 3  . polyethylene glycol (MIRALAX) packet Take 17 g by mouth daily. 30 each 3  . QUEtiapine (SEROQUEL) 50 MG tablet Take 3 tablets (150 mg total) by mouth at bedtime. 90 tablet 3  . senna-docusate (SENOKOT-S) 8.6-50 MG tablet Take 2 tablets by mouth at bedtime. 60 tablet 2  . thiamine 100 MG tablet Take 1 tablet (100 mg total) by mouth daily. 30 tablet 2  . triamcinolone cream (KENALOG) 0.1 % APPLY  CREAM EXTERNALLY TWICE DAILY 30 g 0  . valACYclovir (VALTREX) 1000 MG tablet Take 2 tabs po at symptom onset, repeat in 12 hours 20 tablet 3   No current facility-administered medications for this visit.    Allergies  Allergen Reactions  . Fruit & Vegetable Daily [Nutritional Supplements]  Shortness Of Breath    Aloe  . Iodinated Diagnostic Agents Swelling    Swelling and itching of left side of her face only after CT SI injection(no steroid used)  . Aloe Vera Rash     Review of Systems: All systems reviewed and negative except where noted in HPI.   Lab Results  Component Value Date   WBC 6.0 08/28/2018   HGB 13.3 08/28/2018   HCT 39.2 08/28/2018   MCV 103 (H) 08/28/2018   PLT 195 08/28/2018    Lab Results  Component Value Date   CREATININE 0.82 08/28/2018   BUN 14 08/28/2018   NA 141 08/28/2018   K 3.8 08/28/2018   CL 103 08/28/2018   CO2 23 08/28/2018    Lab Results  Component Value Date   ALT 68 (H) 08/28/2018   AST 87 (H) 08/28/2018   ALKPHOS 116 08/28/2018   BILITOT 0.5 08/28/2018    Lab Results  Component Value Date   IRON 12 (L) 07/09/2018   TIBC 593 (H) 07/09/2018   FERRITIN 10 (L) 07/09/2018   CBC Latest Ref Rng & Units 08/28/2018 07/14/2018 07/11/2018  WBC 3.4 - 10.8 x10E3/uL 6.0 11.9(H) 11.5(H)  Hemoglobin 11.1 - 15.9 g/dL 13.3 9.1(L) 9.1(L)  Hematocrit 34.0 - 46.6 % 39.2 29.4(L) 28.9(L)  Platelets 150 - 450 x10E3/uL 195 249 201     Physical Exam: BP 140/80   Pulse 72   Ht 5' 3.75" (1.619 m)   Wt 111 lb 8 oz (50.6 kg)   BMI 19.29 kg/m  Constitutional: Pleasant, female in no acute distress. HEENT: Normocephalic and atraumatic. Conjunctivae are normal. No scleral icterus. Neck supple.  Cardiovascular: Normal rate, regular rhythm.  Pulmonary/chest: Effort normal and breath sounds normal. No wheezing, rales or rhonchi. Abdominal: Soft, nondistended, nontender. There are no masses palpable. No hepatomegaly. Left  groin localizes pain. No lymphadenopathy. Exam of left hip reveals limited range of motion due to low back pain. No obvious hernia. Standby Haynes for exam Extremities: no edema Lymphadenopathy: No cervical adenopathy noted. Neurological: Alert and oriented to person place and time. Skin: Skin is warm and dry. No  rashes noted. Psychiatric: Normal mood and affect. Behavior is normal.   ASSESSMENT AND PLAN: 57 year old female here for reassessment following issues:  Iron deficiency anemia - multiple upper tract AVMs noted in the setting of iron deficiency anemia and dark stools. These were ablated with APC with good result. Her prior colonoscopy is relatively up-to-date. It's possible she has other AVMs in her small bowel however her anemia has resolved with treatment and iron supplementation. She will continue iron for now and repeat CBC in 6 months for reassessment. If it remains stable she will then stop iron. She agreed the plan  Hepatitis C - genotype 1B, prior elastography in 2016 showing F2-F3 changes. No known cirrhosis. Chronic AST / ALT elevation. Previously vaccinated to hep A / B. I discussed hepatitis C with her and recommend she seek evaluation for therapy to help prevent cirrhosis. I will place a consult for her a follow-up with Dr. Alton Revere of infectious disease whom she has seen previously for this but never followed. She verbalized importance of follow up for this issue and agreed with the plan. Relatively recent CT showing no evidence of cirrhosis and normal platelets is reassuring.  Sciatica / groin pain - I suspect she has inguinal pain for musculoskeletal etiology, perhaps related to her low back pain. She's a prior abdominal imaging in recent months was has not shown any problems at this area.  She will follow-up with her primary care physician for this issue, she may benefit from pain management evaluation.  History of colon polyps - due for recall colonoscopy 10/2019  Aredale Cellar, MD [facility]  CC: Forrest Moron, MD

## 2018-08-29 NOTE — Telephone Encounter (Signed)
Spoke with patient she states she forgot to ask for this at her appointment yesterday. It was filled at the hospital per patient and she would like more

## 2018-08-29 NOTE — Patient Instructions (Addendum)
If you are age 58 or older, your body mass index should be between 23-30. Your Body mass index is 19.29 kg/m. If this is out of the aforementioned range listed, please consider follow up with your Primary Care Provider.  If you are age 47 or younger, your body mass index should be between 19-25. Your Body mass index is 19.29 kg/m. If this is out of the aformentioned range listed, please consider follow up with your Primary Care Provider.   You will be due for lab work in 6 months (CBC). We will reach out to you in March of 2020 and remind you to go to the lab.   You will need to follow up with Dr. Linus Salmons at Infectious Disease to evaluate and treat Hepatitis C. We will put in a referral and they should reach out to you.  Follow up with your Primary Care Provider regarding your groin and back pain.  You will be due for a recall colonoscopy in 11-2019. We will send you a reminder in the mail when it gets closer to that time.   Thank you for entrusting me with your care and for choosing [facility], Dr. Crozet Cellar

## 2018-09-04 ENCOUNTER — Telehealth: Payer: Self-pay | Admitting: Family Medicine

## 2018-09-04 NOTE — Telephone Encounter (Signed)
Copied from [place] [id number]. Topic: Quick Communication - See Telephone Encounter >> Sep 04, 2018  2:25 PM Gardiner Ramus wrote: CRM for notification. See Telephone encounter for: 09/04/18. Pt called and stated that she needs something for pain. She states she can not take tylenol. She states she called 09/01/18 but I do not see any notes  Please advise

## 2018-09-04 NOTE — Telephone Encounter (Signed)
Copied from [place] [id number]. Topic: Quick Communication - See Telephone Encounter >> Sep 04, 2018  2:25 PM Gardiner Ramus wrote: CRM for notification. See Telephone encounter for: 09/04/18. Pt called and stated that she needs something for pain. She states she can not take tylenol. Please advise

## 2018-09-11 ENCOUNTER — Telehealth: Payer: Self-pay | Admitting: Family Medicine

## 2018-09-11 NOTE — Telephone Encounter (Signed)
Copied from [place] [id number]. Topic: Quick Communication - See Telephone Encounter >> Sep 11, 2018 11:05 AM Conception Chancy, NT wrote: CRM for notification. See Telephone encounter for: 09/11/18.  Patient is calling and would like to know has the paper been faxed back to [facility] stating why the patient needs extra hours. She also states [facility] will take 24 transportation rides and that is only to the place and not back. She states she gets 12 days total to go there and back. [employer] only covers a 50 mile radius and it takes her 100 miles there and back. She states she has to go to [place] to the Dentist because that is the only place that takes her insurance. She states her insurance company is needing conformation that Dr. Nolon Rod sent in the information about transportation and not herself (patient). She would like to know if Dr. Nolon Rod has faxed both of these papers back. Please contact.

## 2018-09-15 NOTE — Telephone Encounter (Signed)
Spoke with pt advised we have not received any further documents from [place] re: more hours needed for her aide to help with ADL's.  Advised to have them fax over any forms for further clarification for extra hours.  Pt advises she is going to contact [facility] to send documents for to complete so she can get [facility] to cover more than 50 mile radius.  Advised we sent paperwork back but if they need additional info please advise to send paperwork.  Pt agreeable.  Pt given our fax number and told to send to my attention [place]. Dgaddy, CMA

## 2018-09-19 ENCOUNTER — Telehealth: Payer: Self-pay | Admitting: Family Medicine

## 2018-09-19 NOTE — Telephone Encounter (Signed)
Patient called to request information on paper work that was signed by doctor for transportation to [place] for an appointment, Also stated that paperwork was sent from [facility] for the reason why she need more hours. Paperwork is to be filled out and sent in as soon as possible. Stated that she is not getting anything for pain and all she take is Tylenol and would like something stronger. Patient say she can not get out of bed so she is not eating properly and is severely anemic. Patient has multiple complaints and asking for help in getting her paperwork in a timely manner. Please advise Ph# [phone]

## 2018-09-19 NOTE — Telephone Encounter (Signed)
Spoke with patient in regards to her papers for [facility] to get more hours. Told patient that if we sent to fax it is already headed to the scan center. Checked our papers to be sent to the scan center and Dr. Nolon Rod box and it was not in either of those locations. She is going to have [facility] send another fax to be filled out by Dr. Nolon Rod ASAP.

## 2018-09-27 ENCOUNTER — Encounter: Payer: Self-pay | Admitting: Infectious Diseases

## 2018-10-05 NOTE — Telephone Encounter (Signed)
Advise please. Dgaddy, CMA

## 2018-11-17 IMAGING — US US ART/VEN ABD/PELV/SCROTUM DOPPLER LTD
1 series · 13 of 25 positions shown · non-contrast
Comparison: CT from 08/25/2015

CLINICAL DATA: Left lower back pain x3 weeks. History of
hysterectomy 23 years ago. Still has both ovaries.

EXAM:
TRANSABDOMINAL AND TRANSVAGINAL ULTRASOUND OF PELVIS
DOPPLER ULTRASOUND OF OVARIES
TECHNIQUE: Both transabdominal and transvaginal ultrasound examinations of the
pelvis were performed. Transabdominal technique was performed for
global imaging of the pelvis including uterus, ovaries, adnexal
regions, and pelvic cul-de-sac.
It was necessary to proceed with endovaginal exam following the
transabdominal exam to visualize the ovaries. Color and duplex
Doppler ultrasound was utilized to evaluate blood flow to the
ovaries.

[Series 1: us art/ven abd/pelv/scrotum doppler ltd · 0.18mm/px · 13 of 64 slices shown]
[im 1/64]
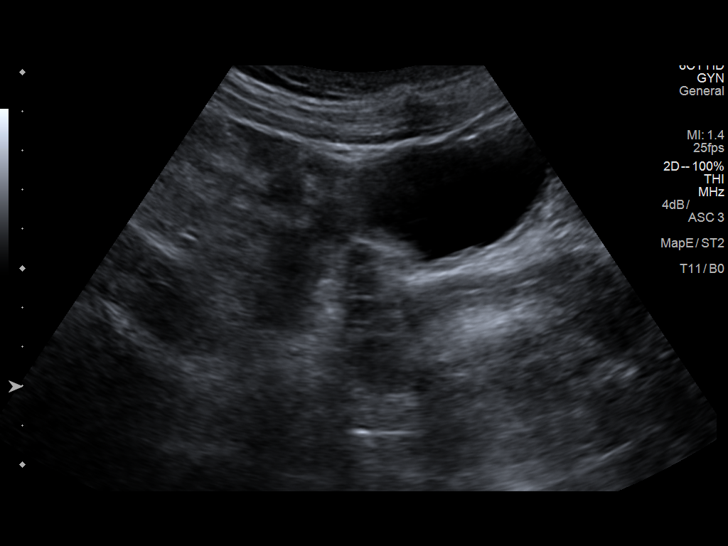
[im 6/64]
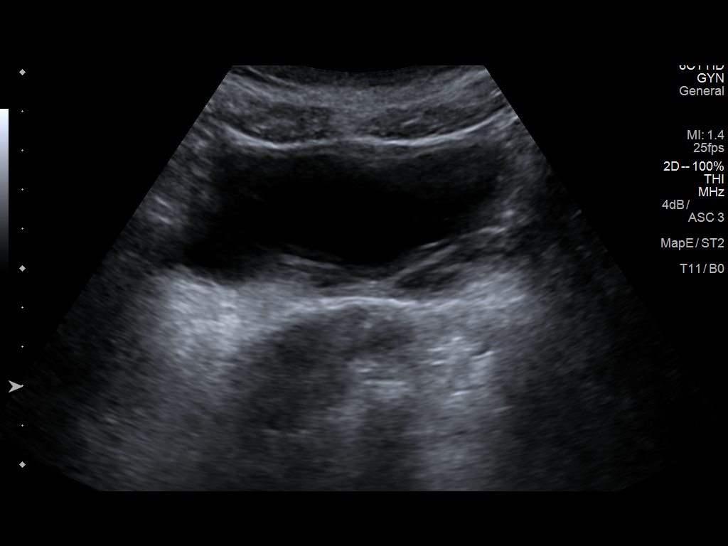
[im 11/64]
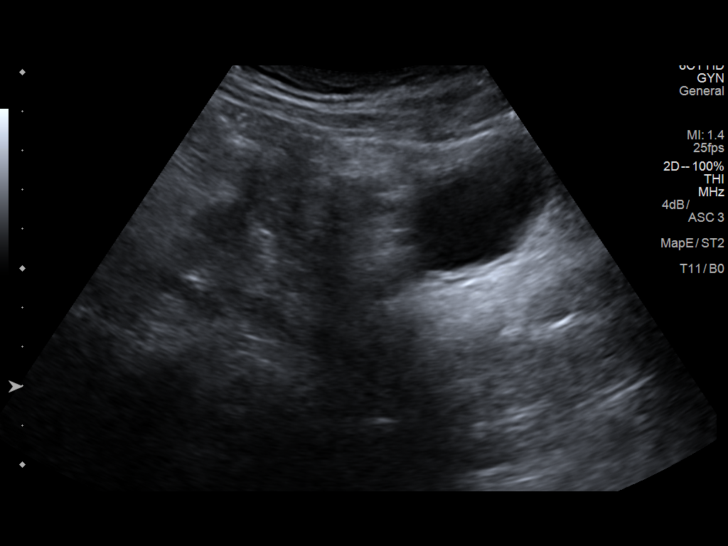
[im 16/64]
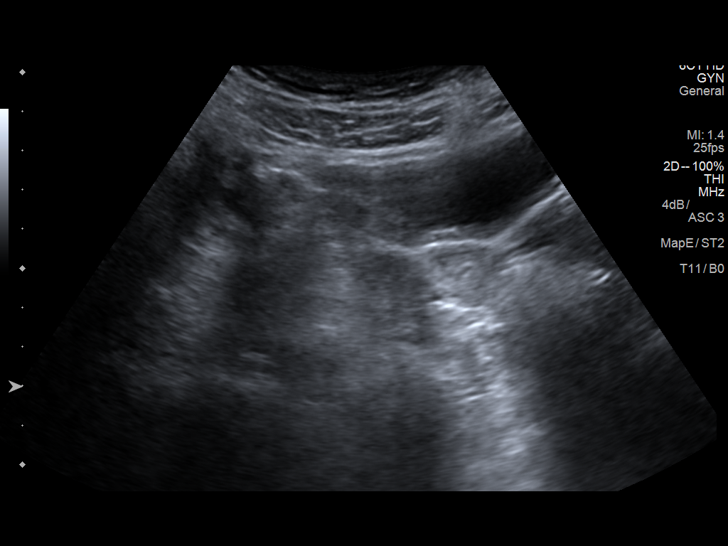
[im 22/64]
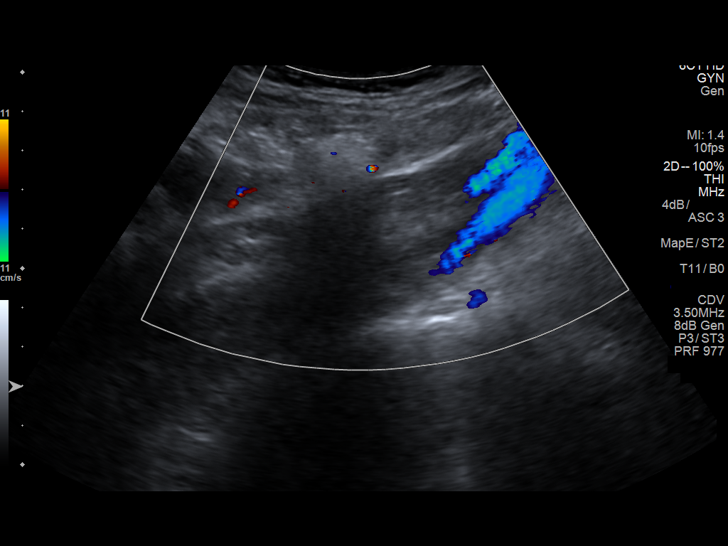
[im 27/64]
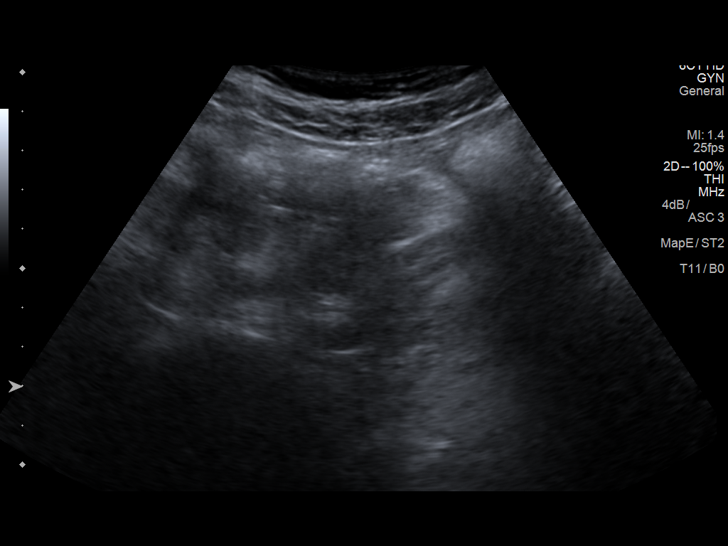
[im 32/64]
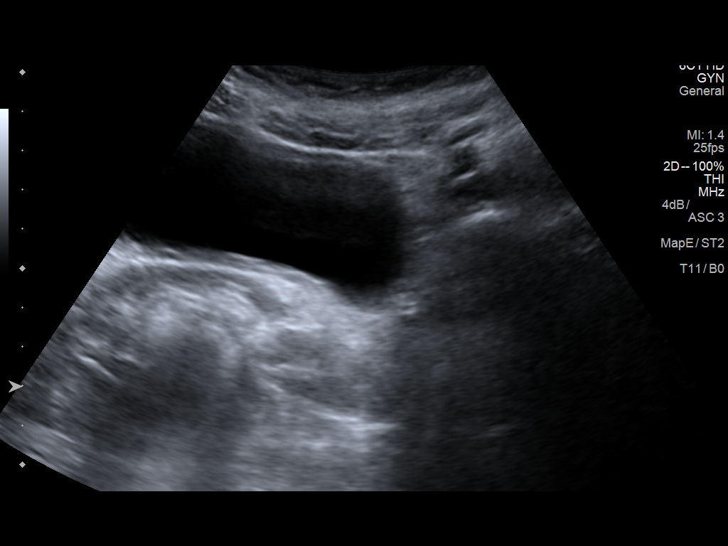
[im 37/64]
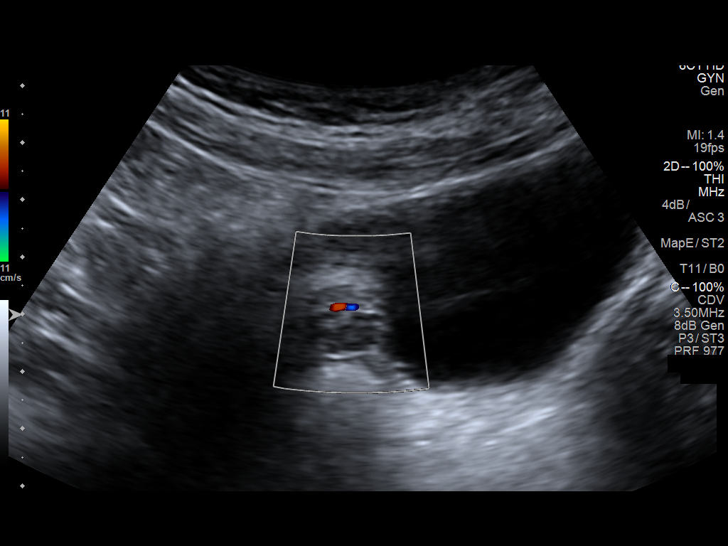
[im 43/64]
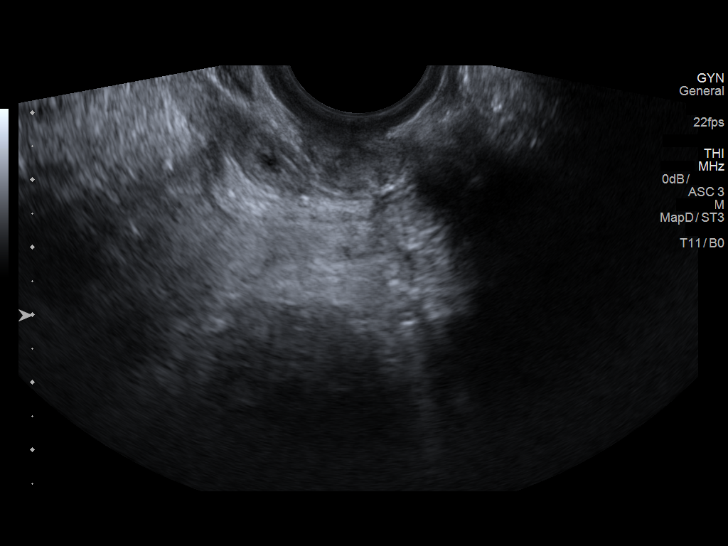
[im 48/64]
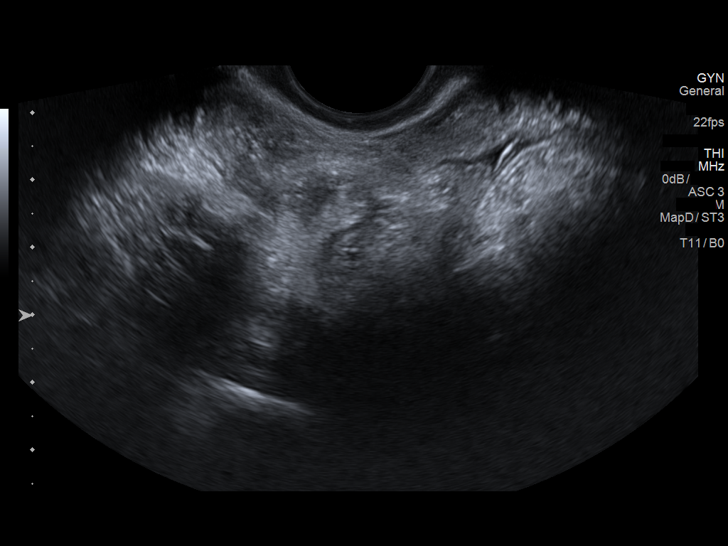
[im 53/64]
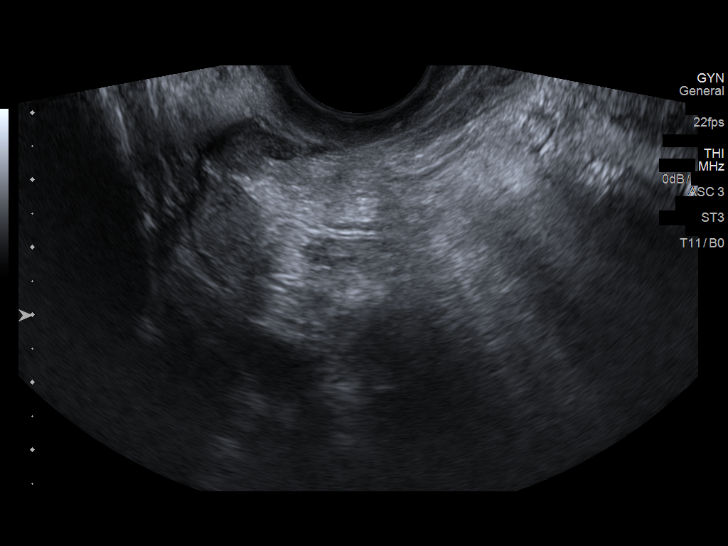
[im 58/64]
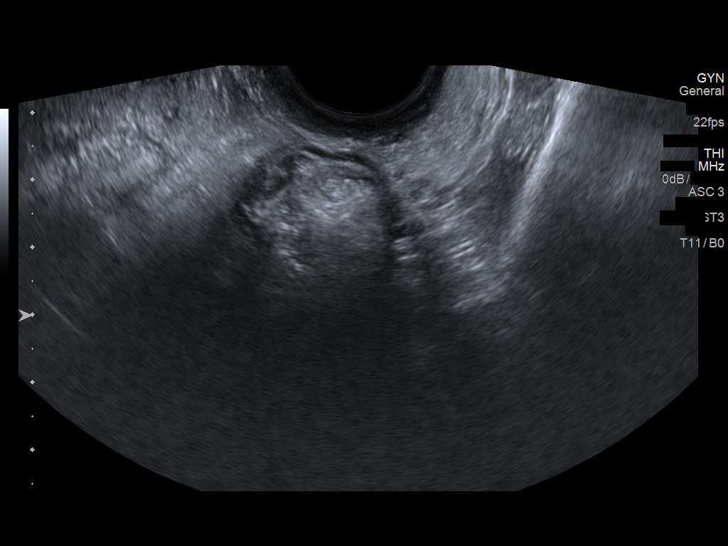
[im 64/64]
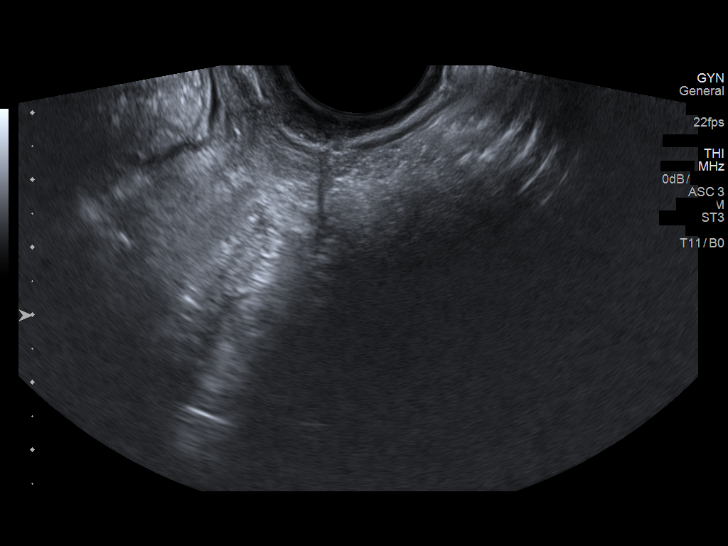

[13 of 25 positions shown; findings below may reference images not displayed]

FINDINGS: Uterus

Surgically absent.

Endometrium

Not applicable.

Right ovary

Not visualized despite transabdominal and transvaginal imaging. No
right adnexal mass or adenopathy identified however.

Left ovary

Measurements: 1.5 x 1.3 x 2.4 cm. Normal appearance/no adnexal mass.
No edema or engorgement of the left ovary.

Pulsed Doppler evaluation of the visualized left ovary demonstrates
normal low-resistance predominantly venous waveforms.

Other findings

No abnormal free fluid.
IMPRESSION: 1. Nonvisualized right ovary. No right-sided adnexal mass is
identified nor adenopathy.
2. Status post hysterectomy.
3. Normal-sized left ovary with predominantly low resistance venous
waveforms demonstrated. Arterial waveforms were difficult to sample.
Given the nonengorged appearance of the left ovary and vascular
outflow demonstrated, acute torsion is believed unlikely.

## 2018-11-27 ENCOUNTER — Ambulatory Visit: Payer: 59 | Admitting: Family Medicine

## 2018-11-30 NOTE — Telephone Encounter (Signed)
Patient no showed 11/27/17.  Unable to talk with pt about [employer] issues.  FYI Dgaddy, CMA

## 2019-01-06 ENCOUNTER — Ambulatory Visit: Payer: 59 | Admitting: Family Medicine

## 2019-01-15 ENCOUNTER — Ambulatory Visit (INDEPENDENT_AMBULATORY_CARE_PROVIDER_SITE_OTHER): Payer: 59 | Admitting: Family Medicine

## 2019-01-15 ENCOUNTER — Encounter: Payer: Self-pay | Admitting: Family Medicine

## 2019-01-15 ENCOUNTER — Ambulatory Visit (INDEPENDENT_AMBULATORY_CARE_PROVIDER_SITE_OTHER): Payer: 59

## 2019-01-15 VITALS — BP 144/100 | HR 80 | Temp 98.6°F | Ht 65.75 in | Wt 112.4 lb

## 2019-01-15 DIAGNOSIS — Z113 Encounter for screening for infections with a predominantly sexual mode of transmission: Secondary | ICD-10-CM

## 2019-01-15 DIAGNOSIS — M25512 Pain in left shoulder: Secondary | ICD-10-CM

## 2019-01-15 DIAGNOSIS — G8929 Other chronic pain: Secondary | ICD-10-CM

## 2019-01-15 DIAGNOSIS — M5442 Lumbago with sciatica, left side: Secondary | ICD-10-CM

## 2019-01-15 DIAGNOSIS — I10 Essential (primary) hypertension: Secondary | ICD-10-CM | POA: Diagnosis not present

## 2019-01-15 DIAGNOSIS — Z1231 Encounter for screening mammogram for malignant neoplasm of breast: Secondary | ICD-10-CM

## 2019-01-15 DIAGNOSIS — J431 Panlobular emphysema: Secondary | ICD-10-CM

## 2019-01-15 MED ORDER — METHOCARBAMOL 500 MG PO TABS: 1000.0000 mg | ORAL_TABLET | Freq: Three times a day (TID) | ORAL | 0 refills | Status: DC | PRN

## 2019-01-15 MED ORDER — GABAPENTIN 400 MG PO CAPS: 400.0000 mg | ORAL_CAPSULE | Freq: Three times a day (TID) | ORAL | 1 refills | Status: DC

## 2019-01-15 MED ORDER — BUDESONIDE-FORMOTEROL FUMARATE 80-4.5 MCG/ACT IN AERO: 2.0000 | INHALATION_SPRAY | Freq: Two times a day (BID) | RESPIRATORY_TRACT | 1 refills | Status: DC

## 2019-01-15 MED ORDER — TRIAMTERENE-HCTZ 37.5-25 MG PO TABS: 1.0000 | ORAL_TABLET | Freq: Every day | ORAL | 0 refills | Status: DC

## 2019-01-15 MED ORDER — ALBUTEROL SULFATE HFA 108 (90 BASE) MCG/ACT IN AERS: INHALATION_SPRAY | RESPIRATORY_TRACT | 1 refills | Status: DC

## 2019-01-15 NOTE — Patient Instructions (Signed)
° ° ° °  If you have lab work done today you will be contacted with your lab results within the next 2 weeks.  If you have not heard from us then please contact us. The fastest way to get your results is to register for My Chart. ° ° °IF you received an x-ray today, you will receive an invoice from [facility]. Please contact [facility] at [phone] with questions or concerns regarding your invoice.  ° °IF you received labwork today, you will receive an invoice from [facility]. Please contact [facility] at [phone] with questions or concerns regarding your invoice.  ° °Our billing staff will not be able to assist you with questions regarding bills from these companies. ° °You will be contacted with the lab results as soon as they are available. The fastest way to get your results is to activate your My Chart account. Instructions are located on the last page of this paperwork. If you have not heard from us regarding the results in 2 weeks, please contact this office. °  ° ° ° °
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
## 2019-01-16 LAB — HIV ANTIBODY (ROUTINE TESTING W REFLEX): HIV Screen 4th Generation wRfx: NONREACTIVE

## 2019-01-16 LAB — WET PREP FOR TRICH, YEAST, CLUE
Clue Cell Exam: NEGATIVE
Trichomonas Exam: NEGATIVE
Yeast Exam: NEGATIVE

## 2019-01-16 LAB — RPR: RPR Ser Ql: NONREACTIVE

## 2019-01-17 LAB — GC/CHLAMYDIA PROBE AMP
Chlamydia trachomatis, NAA: NEGATIVE
Neisseria gonorrhoeae by PCR: NEGATIVE

## 2019-01-26 ENCOUNTER — Telehealth: Payer: Self-pay | Admitting: Family Medicine

## 2019-01-26 NOTE — Telephone Encounter (Signed)
Pt. Called for lab results from 01/15/19.  Stated she does not have ability to look at results in [place].  Advised of result notes per Dr. Pamella Pert in [place]; "your STD tests cam back negative."  Pt. Verb. Understanding.

## 2019-01-26 NOTE — Telephone Encounter (Unsigned)
Copied from [name] [id number]. Topic: General - Other >> Jan 26, 2019 12:07 PM Rayann Heman wrote: Reason for CRM: pt called and stated that she would like a copy of most recent lab results sent to patient in the mail.[address]  [place] [address]

## 2019-01-31 NOTE — Telephone Encounter (Signed)
Have printed recent labs and will place in the mail out today.

## 2019-02-05 ENCOUNTER — Other Ambulatory Visit: Payer: Self-pay

## 2019-02-05 ENCOUNTER — Encounter: Payer: Self-pay | Admitting: Family Medicine

## 2019-02-05 ENCOUNTER — Ambulatory Visit (INDEPENDENT_AMBULATORY_CARE_PROVIDER_SITE_OTHER): Payer: 59 | Admitting: Family Medicine

## 2019-02-05 VITALS — BP 140/85 | HR 80 | Temp 97.6°F | Ht 65.75 in | Wt 111.4 lb

## 2019-02-05 DIAGNOSIS — M25512 Pain in left shoulder: Secondary | ICD-10-CM | POA: Diagnosis not present

## 2019-02-05 DIAGNOSIS — G40909 Epilepsy, unspecified, not intractable, without status epilepticus: Secondary | ICD-10-CM | POA: Diagnosis not present

## 2019-02-05 DIAGNOSIS — I10 Essential (primary) hypertension: Secondary | ICD-10-CM | POA: Diagnosis not present

## 2019-02-05 DIAGNOSIS — F250 Schizoaffective disorder, bipolar type: Secondary | ICD-10-CM

## 2019-02-05 DIAGNOSIS — B001 Herpesviral vesicular dermatitis: Secondary | ICD-10-CM

## 2019-02-05 DIAGNOSIS — G8929 Other chronic pain: Secondary | ICD-10-CM

## 2019-02-05 MED ORDER — SENNOSIDES-DOCUSATE SODIUM 8.6-50 MG PO TABS: 2.0000 | ORAL_TABLET | Freq: Every day | ORAL | 2 refills | Status: DC

## 2019-02-05 MED ORDER — METHOCARBAMOL 750 MG PO TABS: 750.0000 mg | ORAL_TABLET | Freq: Three times a day (TID) | ORAL | 3 refills | Status: DC

## 2019-02-05 MED ORDER — METHOCARBAMOL 750 MG PO TABS: 750.0000 mg | ORAL_TABLET | Freq: Three times a day (TID) | ORAL | 1 refills | Status: DC

## 2019-02-05 MED ORDER — THIAMINE HCL 100 MG PO TABS: 100.0000 mg | ORAL_TABLET | Freq: Every day | ORAL | 1 refills | Status: AC

## 2019-02-05 MED ORDER — TRIAMTERENE-HCTZ 37.5-25 MG PO TABS: 1.0000 | ORAL_TABLET | Freq: Every day | ORAL | 0 refills | Status: DC

## 2019-02-05 MED ORDER — VALACYCLOVIR HCL 1 G PO TABS: ORAL_TABLET | ORAL | 3 refills | Status: DC

## 2019-02-05 MED ORDER — ONDANSETRON HCL 4 MG PO TABS: 4.0000 mg | ORAL_TABLET | Freq: Four times a day (QID) | ORAL | 0 refills | Status: DC | PRN

## 2019-02-05 MED ORDER — THIAMINE HCL 100 MG PO TABS: 100.0000 mg | ORAL_TABLET | Freq: Every day | ORAL | 2 refills | Status: DC

## 2019-02-05 NOTE — Patient Instructions (Signed)
Please call Dr Delice Lesch regarding your lamictal  Please call our psychiatrist for seroquel

## 2019-02-05 NOTE — Progress Notes (Signed)
2/24/202012:27 PM  Melissa Bridges 02-11-1961, 58 y.o. female [id number]  Chief Complaint  Patient presents with  . Pain    in the left shoulder due to fall, dislocated shoulder 12 yrs ago. Arm is tingling and fingers are blue  . Medication Refill    amtical, robaxin,zofran, seroquel, senokot, thiamine, triamterene, valtrex    HPI:   Patient is a 58 y.o. female with past medical history significant for HTN, asthma, COPD, seizures, schizoaffective disorder, DDD of lumbar spine, substance use, Hep C who presents today for medications refills  Seen last OV Feb for same concerns of left shoulder pain Referred to ortho for left shoulder pain, has appt in 4 days She is requesting refill of robaxin which helps with pain  She is not on lamictal due to insurance, she put herself back on depakote, which she reports was stopped due to LFT abnormalities - these are for seizures, Dr Delice Lesch, has appt on march 4th  She still sees pscychiatry for her schizoaffective disorder She reports too much somnolence on seroquel 300mg , she has cut back to 150mg , better, not sleeping all day, denies any worsening mood  She takes zofran prn for nausea, she thinks it is mostly related to medications  Takes valtrex prn for outbreaks of herpes on lip Has not had one in a while  PCP Dr Nolon Rod  Fall Risk  01/15/2019 08/28/2018 07/27/2018 07/05/2018 06/26/2018  Falls in the past year? 0 Yes Yes Yes No  Number falls in past yr: 0 2 or more 2 or more 2 or more -  Comment - - - - -  Injury with Fall? 0 No No - -  Risk Factor Category  - - High Fall Risk - -  Follow up - - - - -     Depression screen [facility] 2/9 08/28/2018 07/05/2018 06/26/2018  Decreased Interest 2 3 1   Down, Depressed, Hopeless 2 3 1   PHQ - 2 Score 4 6 2   Altered sleeping 2 3 1   Tired, decreased energy 3 3 1   Change in appetite 0 1 1  Feeling bad or failure about yourself  2 3 1   Trouble concentrating 2 0 1  Moving slowly or fidgety/restless 2  0 1  Suicidal thoughts 0 0 0  PHQ-9 Score 15 16 8   Difficult doing work/chores Very difficult - -  Some recent data might be hidden    Allergies  Allergen Reactions  . Fruit & Vegetable Daily [Nutritional Supplements] Shortness Of Breath    Aloe  . Iodinated Diagnostic Agents Swelling    Swelling and itching of left side of her face only after CT SI injection(no steroid used)  . Aloe Vera Rash    Prior to Admission medications   Medication Sig Start Date End Date Taking? Authorizing Provider  acetaminophen (TYLENOL) 500 MG tablet Take 500 mg by mouth once.   Yes [provider]  albuterol (VENTOLIN HFA) 108 (90 Base) MCG/ACT inhaler INHALE TWO PUFFS BY MOUTH EVERY 6 HOURS AS NEEDED FOR SHORTNESS OF BREATH 01/15/19  Yes Rutherford Guys, MD  amLODipine (NORVASC) 10 MG tablet Take 1 tablet (10 mg total) by mouth daily. 07/15/18  Yes Emokpae, Courage, MD  budesonide-formoterol (SYMBICORT) 80-4.5 MCG/ACT inhaler Inhale 2 puffs into the lungs 2 (two) times daily. 01/15/19  Yes Rutherford Guys, MD  citalopram (CELEXA) 10 MG tablet Take 1 tablet (10 mg total) by mouth daily. 07/16/18  Yes Emokpae, Courage, MD  diclofenac sodium (VOLTAREN) 1 %  GEL Apply 2 g topically 4 (four) times daily. 05/24/18  Yes Rizwan, Eunice Blase, MD  diphenhydrAMINE-APAP, sleep, (TYLENOL PM EXTRA STRENGTH PO) Take 500 mg by mouth once.    Yes [provider]  ferrous sulfate 325 (65 FE) MG tablet Take 1 tablet (325 mg total) by mouth 2 (two) times daily with a meal. 07/05/18  Yes Wardell Honour, MD  folic acid (FOLVITE) 1 MG tablet Take 1 tablet (1 mg total) by mouth daily. 07/16/18  Yes Emokpae, Courage, MD  gabapentin (NEURONTIN) 400 MG capsule Take 1 capsule (400 mg total) by mouth 3 (three) times daily. 01/15/19  Yes Rutherford Guys, MD  ipratropium-albuterol (DUONEB) 0.5-2.5 (3) MG/3ML SOLN Take 3 mLs by nebulization every 4 (four) hours as needed (shortness of breath). Reported on 12/02/2015   Yes [provider]  methocarbamol (ROBAXIN) 750 MG tablet Take 1 tablet (750 mg total) by mouth 3 (three) times daily. 07/15/18  Yes Roxan Hockey, MD  Multiple Vitamin (MULTIVITAMIN WITH MINERALS) TABS tablet Take 1 tablet by mouth daily. 07/16/18  Yes Emokpae, Courage, MD  nicotine (NICODERM CQ - DOSED IN MG/24 HOURS) 14 mg/24hr patch Place 1 patch (14 mg total) onto the skin daily. 07/15/18  Yes Emokpae, Courage, MD  polyethylene glycol (MIRALAX) packet Take 17 g by mouth daily. 07/15/18  Yes Emokpae, Courage, MD  QUEtiapine (SEROQUEL) 50 MG tablet Take 3 tablets (150 mg total) by mouth at bedtime. 07/15/18  Yes Emokpae, Courage, MD  senna-docusate (SENOKOT-S) 8.6-50 MG tablet Take 2 tablets by mouth at bedtime. 07/15/18  Yes Emokpae, Courage, MD  thiamine 100 MG tablet Take 1 tablet (100 mg total) by mouth daily. 07/16/18  Yes Emokpae, Courage, MD  triamcinolone cream (KENALOG) 0.1 % APPLY  CREAM EXTERNALLY TWICE DAILY 09/02/17  Yes Wardell Honour, MD  triamterene-hydrochlorothiazide (MAXZIDE-25) 37.5-25 MG tablet Take 1 tablet by mouth daily. 01/15/19  Yes Rutherford Guys, MD  valACYclovir (VALTREX) 1000 MG tablet Take 2 tabs po at symptom onset, repeat in 12 hours 07/15/18  Yes Roxan Hockey, MD    Past Medical History:  Diagnosis Date  . Allergy   . Alopecia   . Anxiety   . Arthritis   . Asthma   . Cataract    "mild"  . COPD (chronic obstructive pulmonary disease) ([place])   . Depression   . GERD (gastroesophageal reflux disease)    o cc- takes OTC if needed.  . Glaucoma   . Hepatitis C   . Hypertension    onset age 34.  . Iron deficiency anemia   . Migraine   . Schizoaffective disorder ([place])   . Sciatic pain   . Seizures ([place])    onset in childhood. 06-03-16- per pt 8 months agoGeneralized tonic-clonic.Last seizure couple of months ago- last sz 9-10 months ago per pt 10-12-2016  . Shortness of breath dyspnea   . Substance abuse ([place])   . Ulcer     Past Surgical History:  Procedure  Laterality Date  . ABDOMINAL HYSTERECTOMY     ovarian cyst B, cervical dysplasia, fibroids.   Ovaries intact.  [name] BIOPSY  07/11/2018   Procedure: BIOPSY;  Surgeon: Yetta Flock, MD;  [facility];  Service: Gastroenterology;;  . COLONOSCOPY    . COLONOSCOPY W/ BIOPSIES    . DILATION AND CURETTAGE OF UTERUS     x2  . ESOPHAGOGASTRODUODENOSCOPY (EGD) WITH PROPOFOL N/A 07/11/2018   Procedure: ESOPHAGOGASTRODUODENOSCOPY (EGD) WITH PROPOFOL;  Surgeon: Yetta Flock,  MD;  [facility];  Service: Gastroenterology;  Laterality: N/A;  . EXPLORATORY LAPAROTOMY     x3  . GYNECOLOGIC CRYOSURGERY     x3  . HOT HEMOSTASIS N/A 07/11/2018   Procedure: HOT HEMOSTASIS (ARGON PLASMA COAGULATION/BICAP);  Surgeon: Yetta Flock, MD;  [facility];  Service: Gastroenterology;  Laterality: N/A;  . MANDIBLE RECONSTRUCTION N/A 06/27/2015   Procedure: REMOVAL MAXILLARY PALATAL TORUS.  REMOVAL MANDIBULAR TORUS AND EXOSTOSIS.  ;  Surgeon: Diona Browner, DDS;  Location: [facility];  Service: Oral Surgery;  Laterality: N/A;  . OVARIAN CYST REMOVAL  2008  . POLYPECTOMY      Social History   Tobacco Use  . Smoking status: Current Every Day Smoker    Packs/day: 0.33    Years: 44.00    Pack years: 14.52    Types: Cigarettes  . Smokeless tobacco: Never Used  Substance Use Topics  . Alcohol use: Yes    Alcohol/week: 0.0 standard drinks    Comment: occasionally    Family History  Problem Relation Age of Onset  . Diabetes type II Mother   . Hypertension Mother   . Arthritis Mother   . Diabetes Mother   . Mental illness Mother        bipolar  . Diabetes type II Maternal Aunt   . Hypertension Father   . Heart murmur Father   . Arthritis Father   . Stroke Father   . Alopecia Sister   . Alopecia Brother   . Alopecia Sister   . Mental retardation Sister        depression  . Prostate cancer Paternal Grandfather   . Cancer Maternal Uncle   . Colon cancer Neg Hx     . Esophageal cancer Neg Hx   . Rectal cancer Neg Hx   . Stomach cancer Neg Hx   . Colon polyps Neg Hx     ROS Per hpi  OBJECTIVE:  Blood pressure 140/85, pulse 80, temperature 97.6 F (36.4 C), height 5' 5.75" (1.67 m), weight 111 lb 6.4 oz (50.5 kg), SpO2 97 %. Body mass index is 18.12 kg/m.   Physical Exam Vitals signs and nursing note reviewed.  Constitutional:      Appearance: She is well-developed.  HENT:     Head: Normocephalic and atraumatic.  Eyes:     General: No scleral icterus.    Conjunctiva/sclera: Conjunctivae normal.     Pupils: Pupils are equal, round, and reactive to light.  Neck:     Musculoskeletal: Neck supple.  Pulmonary:     Effort: Pulmonary effort is normal.  Skin:    General: Skin is warm and dry.  Neurological:     Mental Status: She is alert and oriented to person, place, and time.     ASSESSMENT and PLAN  1. Chronic left shoulder pain Has upcoming appt with ortho in 4 days  2. Essential hypertension Controlled. Continue current regime.   3. Seizure disorder ([place]) Managed by neuro. Has upcoming appt in 1 week. Discuss medication concerns with them  4. Schizoaffective disorder, bipolar type ([place]) Managed by psych. Patient thinks she has an appt soon. I advised she discuss medication concerns with them.  5. Herpes labialis - valACYclovir (VALTREX) 1000 MG tablet; Take 2 tabs po at symptom onset, repeat in 12 hours  Other orders - senna-docusate (SENOKOT-S) 8.6-50 MG tablet; Take 2 tablets by mouth at bedtime. - triamterene-hydrochlorothiazide (MAXZIDE-25) 37.5-25 MG tablet; Take 1 tablet by mouth daily. - thiamine  100 MG tablet; Take 1 tablet (100 mg total) by mouth daily. - ondansetron (ZOFRAN) 4 MG tablet; Take 1 tablet (4 mg total) by mouth every 6 (six) hours as needed for nausea. - methocarbamol (ROBAXIN) 750 MG tablet; Take 1 tablet (750 mg total) by mouth 3 (three) times daily.  Return in about 3 months (around 05/06/2019)  for Dr Nolon Rod.    Rutherford Guys, MD Primary Care at [place], [place] [address] Ph.  [phone] Fax [phone]

## 2019-02-09 ENCOUNTER — Ambulatory Visit (INDEPENDENT_AMBULATORY_CARE_PROVIDER_SITE_OTHER): Payer: 59 | Admitting: Orthopaedic Surgery

## 2019-02-13 ENCOUNTER — Telehealth: Payer: Self-pay

## 2019-02-13 NOTE — Telephone Encounter (Signed)
-----   Message from Roetta Sessions, [place] sent at 08/29/2018  1:48 PM EDT ----- Regarding: cbc due in March 2020 Pt due for cbc in March 2020 for IDA

## 2019-02-13 NOTE — Telephone Encounter (Signed)
Letter mailed to pt.  

## 2019-02-14 ENCOUNTER — Ambulatory Visit: Payer: Self-pay | Admitting: Neurology

## 2019-02-15 ENCOUNTER — Encounter: Payer: Self-pay | Admitting: Neurology

## 2019-02-15 ENCOUNTER — Other Ambulatory Visit: Payer: Self-pay | Admitting: Family Medicine

## 2019-02-16 NOTE — Telephone Encounter (Signed)
Zofran refill request    Non-delegated medication  Pt of Dr. Nolon Rod

## 2019-03-08 ENCOUNTER — Other Ambulatory Visit: Payer: Self-pay | Admitting: Family Medicine

## 2019-03-08 NOTE — Telephone Encounter (Signed)
Requested medication (s) are due for refill today: Yes  Requested medication (s) are on the active medication list: Yes  Last refill:  02/05/19  Future visit scheduled: Yes  Notes to clinic: Unable to refill, cannot delegate     Requested Prescriptions  Pending Prescriptions Disp Refills   methocarbamol (ROBAXIN) 750 MG tablet 90 tablet 1    Sig: Take 1 tablet (750 mg total) by mouth 3 (three) times daily.     Not Delegated - Analgesics:  Muscle Relaxants Failed - 03/08/2019 12:21 PM      Failed - This refill cannot be delegated      Passed - Valid encounter within last 6 months    Recent Outpatient Visits          1 month ago Chronic left shoulder pain   [facility], Lilia Argue, MD   1 month ago Essential hypertension   [facility], Lilia Argue, MD   6 months ago Diaphragmatic hernia without obstruction or gangrene   Primary Care at [facility], Arlie Solomons, MD   8 months ago Degenerative disc disease, lumbar   Primary Care at [facility], Renette Butters, MD   8 months ago Chronic arthralgias of knees and hips   Primary Care at [facility], Renette Butters, MD      Future Appointments            In 2 months Forrest Moron, MD Primary Care at [place], [facility]

## 2019-03-09 NOTE — Telephone Encounter (Signed)
Can someone reach out to patient, pt is requesting another refill early per pharmacy to check on her sx

## 2019-03-13 MED ORDER — METHOCARBAMOL 750 MG PO TABS: 750.0000 mg | ORAL_TABLET | Freq: Three times a day (TID) | ORAL | 1 refills | Status: DC

## 2019-03-13 NOTE — Telephone Encounter (Signed)
Patient is requesting a refill of the following medications: Requested Prescriptions   Pending Prescriptions Disp Refills  . methocarbamol (ROBAXIN) 750 MG tablet 90 tablet 1    Sig: Take 1 tablet (750 mg total) by mouth 3 (three) times daily.    Date of patient request: 03/36/2020 Last office visit: 02/05/2019 Date of last refill: 02/05/2019 Last refill amount: 90 tab with one refill Follow up time period per chart: Return in about 3 months (around 05/06/2019) for Dr Nolon Rod.

## 2019-03-28 ENCOUNTER — Ambulatory Visit: Payer: Self-pay | Admitting: *Deleted

## 2019-03-28 ENCOUNTER — Other Ambulatory Visit: Payer: Self-pay

## 2019-03-28 ENCOUNTER — Telehealth (INDEPENDENT_AMBULATORY_CARE_PROVIDER_SITE_OTHER): Payer: 59 | Admitting: Family Medicine

## 2019-03-28 DIAGNOSIS — J4541 Moderate persistent asthma with (acute) exacerbation: Secondary | ICD-10-CM

## 2019-03-28 DIAGNOSIS — J019 Acute sinusitis, unspecified: Secondary | ICD-10-CM

## 2019-03-28 DIAGNOSIS — J431 Panlobular emphysema: Secondary | ICD-10-CM

## 2019-03-28 MED ORDER — BUDESONIDE-FORMOTEROL FUMARATE 160-4.5 MCG/ACT IN AERO: 2.0000 | INHALATION_SPRAY | Freq: Two times a day (BID) | RESPIRATORY_TRACT | 3 refills | Status: DC

## 2019-03-28 MED ORDER — AMOXICILLIN 875 MG PO TABS: 875.0000 mg | ORAL_TABLET | Freq: Two times a day (BID) | ORAL | 0 refills | Status: DC

## 2019-03-28 MED ORDER — IPRATROPIUM-ALBUTEROL 0.5-2.5 (3) MG/3ML IN SOLN: 3.0000 mL | Freq: Four times a day (QID) | RESPIRATORY_TRACT | 1 refills | Status: DC | PRN

## 2019-03-28 MED ORDER — ALBUTEROL SULFATE HFA 108 (90 BASE) MCG/ACT IN AERS: INHALATION_SPRAY | RESPIRATORY_TRACT | 5 refills | Status: DC

## 2019-03-28 NOTE — Progress Notes (Signed)
Telemedicine Encounter- SOAP NOTE Established Patient  I discussed the limitations, risks, security and privacy concerns of performing an evaluation and management service by telephone and the availability of in person appointments. I also discussed with the patient that there may be a patient responsible charge related to this service. The patient expressed understanding and agreed to proceed.  This telephone encounter was conducted with the patient's  verbal consent via audio telecommunications: yes Patient was instructed to have this encounter in a suitably private space; and to only have persons present to whom they give permission to participate. In addition, patient identity was confirmed by use of name plus two identifiers (DOB and address).  I spent a total of 40  talking with the patient   CC.  Pt states she has been having some shob,fatigue, and some chronic asthma symptoms.  She states she has been using her inhaler and nebulized treatment more then normal. She states she feels it may be from the pollen but she is concerned. She states she has had some cough with mucus with no chest pain. Mucus green and yellow.  Subjective   Melissa Bridges is a 58 y.o. female established patient. Telephone visit today for cough and SOB HPI  Pt with worsening SOB, cough noted over the past 3 weeks. Pt states she is using proair 1-2 times a day. Pt using her nebulizer 1 time a day and is concerned she is running out of medication. Pt states she uses her symbicort 80-1- time a day but has started using it 2 times a day due to worsening SOB/cough. Pt states she has started to experience thick mucous-" green/yellow boogers"  From the nose and with coughing. Pt states she used mucinex which helps her move the mucous. Pt has a sore throat and has been using generic spray otc with some improvement. Pt reports no fever but she istaking tylenol due to pain in her shoulder. Pt is having more difficulty  completing ADL-microwaving food and has help with cleaning. Pt states she has chronic pain with her shoulder and back. Pt states the neurologist recently changed her seizure medication and she forgot her follow up appointment.pt recently had cataract surgery. Pt's daughter will pick up medication and assists mother with food and medications.    Patient Active Problem List   Diagnosis Date Noted  . Overdose   . Melena   . Suicidal ideation 07/09/2018  . Anemia 07/09/2018  . Cocaine abuse ([place]) 05/24/2018  . Hypomagnesemia 05/24/2018  . Chronic left hip pain 05/24/2018  . Hypokalemia 05/23/2018  . Tylenol overdose 05/23/2018  . Abdominal pain 05/23/2018  . COPD (chronic obstructive pulmonary disease) ([place]) 05/23/2018  . Serrated adenoma of colon 08/05/2016  . Localization-related idiopathic epilepsy and epileptic syndromes with seizures of localized onset, not intractable, without status epilepticus ([place]) 10/16/2015  . 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 Seizure disorder ([place]) 09/09/2014  . Asthma with acute exacerbation 03/09/2013  . Hypertension 03/09/2013  . Lower back pain 03/09/2013  . Chronic hepatitis C without hepatic coma ([place]) 03/08/2013  . Smoker 12/12/2012  . Schizoaffective disorder, bipolar type ([place]) 12/12/2012    Past Medical History:  Diagnosis Date  . Allergy   . Alopecia   . Anxiety   . Arthritis   . Asthma   . Cataract    "mild"  . COPD (chronic obstructive pulmonary disease) ([place])   . Depression   . GERD (gastroesophageal reflux disease)    o cc- takes OTC  if needed.  . Glaucoma   . Hepatitis C   . Hypertension    onset age 89.  . Iron deficiency anemia   . Migraine   . Schizoaffective disorder ([place])   . Sciatic pain   . Seizures ([place])    onset in childhood. 06-03-16- per pt 8 months agoGeneralized tonic-clonic.Last seizure couple of months ago- last sz 9-10 months ago per pt 10-12-2016  . Shortness of breath dyspnea   . Substance abuse ([place])    . Ulcer     Current Outpatient Medications  Medication Sig Dispense Refill  . acetaminophen (TYLENOL) 500 MG tablet Take 500 mg by mouth once.    [name] albuterol (VENTOLIN HFA) 108 (90 Base) MCG/ACT inhaler INHALE TWO PUFFS BY MOUTH EVERY 6 HOURS AS NEEDED FOR SHORTNESS OF BREATH 18 g 1  . amLODipine (NORVASC) 10 MG tablet Take 1 tablet (10 mg total) by mouth daily. 30 tablet 1  . budesonide-formoterol (SYMBICORT) 80-4.5 MCG/ACT inhaler Inhale 2 puffs into the lungs 2 (two) times daily. 3 Inhaler 1  . citalopram (CELEXA) 10 MG tablet Take 1 tablet (10 mg total) by mouth daily. 30 tablet 4  . diclofenac sodium (VOLTAREN) 1 % GEL Apply 2 g topically 4 (four) times daily. 1 Tube 0  . diphenhydrAMINE-APAP, sleep, (TYLENOL PM EXTRA STRENGTH PO) Take 500 mg by mouth once.     . ferrous sulfate 325 (65 FE) MG tablet Take 1 tablet (325 mg total) by mouth 2 (two) times daily with a meal. 527 tablet 1  . folic acid (FOLVITE) 1 MG tablet Take 1 tablet (1 mg total) by mouth daily. 30 tablet 2  . gabapentin (NEURONTIN) 400 MG capsule Take 1 capsule (400 mg total) by mouth 3 (three) times daily. 270 capsule 1  . ipratropium-albuterol (DUONEB) 0.5-2.5 (3) MG/3ML SOLN Take 3 mLs by nebulization every 4 (four) hours as needed (shortness of breath). Reported on 12/02/2015    . lamoTRIgine (LAMICTAL) 25 MG tablet     . methocarbamol (ROBAXIN) 750 MG tablet Take 1 tablet (750 mg total) by mouth 3 (three) times daily. 90 tablet 1  . Multiple Vitamin (MULTIVITAMIN WITH MINERALS) TABS tablet Take 1 tablet by mouth daily. 30 tablet 1  . nicotine (NICODERM CQ - DOSED IN MG/24 HOURS) 14 mg/24hr patch Place 1 patch (14 mg total) onto the skin daily. 28 patch 0  . ondansetron (ZOFRAN) 4 MG tablet TAKE 1 TABLET BY MOUTH EVERY 6 HOURS AS NEEDED FOR NAUSEA 20 tablet 0  . pantoprazole (PROTONIX) 40 MG tablet     . polyethylene glycol (MIRALAX) packet Take 17 g by mouth daily. 30 each 3  . QUEtiapine (SEROQUEL) 50 MG tablet Take  3 tablets (150 mg total) by mouth at bedtime. 90 tablet 3  . senna-docusate (SENOKOT-S) 8.6-50 MG tablet Take 2 tablets by mouth at bedtime. 60 tablet 2  . thiamine 100 MG tablet Take 1 tablet (100 mg total) by mouth daily. 90 tablet 1  . triamcinolone cream (KENALOG) 0.1 % APPLY  CREAM EXTERNALLY TWICE DAILY 30 g 0  . triamterene-hydrochlorothiazide (MAXZIDE-25) 37.5-25 MG tablet Take 1 tablet by mouth daily. 90 tablet 0  . valACYclovir (VALTREX) 1000 MG tablet Take 2 tabs po at symptom onset, repeat in 12 hours 20 tablet 3   No current facility-administered medications for this visit.     Allergies  Allergen Reactions  . Fruit & Vegetable Daily [Nutritional Supplements] Shortness Of Breath    Aloe  . Iodinated  Diagnostic Agents Swelling    Swelling and itching of left side of her face only after CT SI injection(no steroid used)  . Aloe Vera Rash    Social History  Lives alone -assisted living  Socioeconomic History  . Marital status: Widowed    Spouse name: Not on file  . Number of children: 1  . Years of education: Not on file  . Highest education level: Not on file  Occupational History  . Occupation: disabled    Comment: mental illness; seizures  Social Needs  . Financial resource strain: Not on file  . Food insecurity:    Worry: Not on file    Inability: Not on file  . Transportation needs:    Medical: Not on file    Non-medical: Not on file  Tobacco Use  . Smoking status: Current Every Day Smoker    Packs/day: 0.33    Years: 44.00    Pack years: 14.52    Types: Cigarettes  . Smokeless tobacco: Never Used  Substance and Sexual Activity  . Alcohol use: Yes    Alcohol/week: 0.0 standard drinks    Comment: occasionally  . Drug use: Yes    Types: Marijuana, Cocaine  . Sexual activity: Not Currently    Birth control/protection: None    Comment: widow  Lifestyle  . Physical activity:    Days per week: Not on file    Minutes per session: Not on file  . Stress:  Not on file  Relationships  . Social connections:    Talks on phone: Not on file    Gets together: Not on file    Attends religious service: Not on file    Active member of club or organization: Not on file    Attends meetings of clubs or organizations: Not on file    Relationship status: Not on file  . Intimate partner violence:    Fear of current or ex partner: Not on file    Emotionally abused: Not on file    Physically abused: Not on file    Forced sexual activity: Not on file  Other Topics Concern  . Not on file  Social History Narrative   Marital status:  Widowed since 2002.  Married x 16 years. + dating.  Moved from [place] to live with daughter in 2013.     Children:  One child/daughter (33); two grandchildren.      Lives: with daughter, grandchildren 2.  Does not drive due to epilepsy.      Employment:  Disability for schizoaffective disorder.      Tobacco: 1 ppd x since 8th grade.      Alcohol:  Social; rare drinking due to seizure medications.  Weekends.       Drugs: none; previous use of marijuana.  Previous iv drug use, cocaine.      Exercise: none      Seatbelt:  100%      Guns: none    ROS CONSTITUTIONAL: no weight loss, fever-wakes at night due to pain EENT: sinus problems and  nasal congestion-thick mucous and sore throat CV: no chest pain, RESP: SOB and cough, with sputum production-thick green MS:, shoulder pain and back pain NEURO: seizure d/o-sees neuro-recent medication change  Objective   Vitals as reported by the patient:none-pt able to talk for 40 minutes-no cough noted during our conversation. Pt did not become breathless or need to stop talking to catch her breath. Moderate persistent asthma with acute exacerbation Albuterol nebulizer soln-rx-use prior  to symbicort twice a day Increase dose of symbicort to 160mg  and use 2 puffs twice a day Refilled albuterol MDI-rx Panlobular emphysema (HCC)-pt continues to smoke-concern for exacerbation for  underlying respiratory disease.  Acute non-recurrent sinusitis, unspecified location-amoxil-rx, continue mucinex-concern for trigger for asthma/COPD exacerbation Pt with increase in sinus drainage with difficulty clearing mucous-increase water to aid in secretions    I discussed the assessment and treatment plan with the patient. The patient was provided an opportunity to ask questions and all were answered. The patient agreed with the plan and demonstrated an understanding of the instructions.   The patient was advised to call back or seek an in-person evaluation if the symptoms worsen or if the condition fails to improve as anticipated. Pt to contact clinic through my chart for questions concerns. Daughter is able to use my chart-assist pt to communicate.  I provided 59minutes of non-face-to-face time during this encounter.  Chan Rosasco Hannah Beat, MD  Primary Care at [facility] 03-28-19

## 2019-03-28 NOTE — Progress Notes (Signed)
Pt states she has been having some shob,fatigue, and some chronic asthma symptoms.  She states she has been using her inhaler and nebulized treatment more then normal. She states she feels it may be from the pollen but she is concerned. She states she has had some cough with mucus with no chest pain. Mucus green and yellow.

## 2019-03-28 NOTE — Patient Instructions (Signed)
Pick up medication from pharmacy: NEW DOSE SYMBICORT-use 2 inhalations twice a day USE NEBULIZER PRIOR TO SYMBICORT Have Albuterol inhaler to use as needed and keep with pt at all times for emergencies-cough/short of breath Start antibiotics for sinus infection that is triggering the asthma symptoms Drink plenty of water to help with mucous

## 2019-03-28 NOTE — Telephone Encounter (Signed)
Pt has appointment today with provider. 

## 2019-03-28 NOTE — Telephone Encounter (Signed)
Pt reports increased SOB. States mild at rest, worsens with exertion. H/O asthma and COPD. States has been using inhalers which help "A little." States is having to use them more often. Also did nebs treatment Monday Also reports sore throat, runny nose, headache, productive cough for yellow-greenish phlegm. States eyes red and itchy, body aches, unsure if febrile. Has taken Mucinex and benadryl. No travel , no known exposure.  Email and phone # verified. Pt states does not have access to email. [place] transferred call to practice for consideration of appt; Care advise given per protocol.    Reason for Disposition . [1] Longstanding difficulty breathing (e.g., CHF, COPD, emphysema) AND [2] WORSE than normal  Answer Assessment - Initial Assessment Questions 1. RESPIRATORY STATUS: "Describe your breathing?" (e.g., wheezing, shortness of breath, unable to speak, severe coughing)      SOB, mild at rest, worsens with exertion 2. ONSET: "When did this breathing problem begin?"      1 week ago 3. PATTERN "Does the difficult breathing come and go, or has it been constant since it started?"      Intermittent 4. SEVERITY: "How bad is your breathing?" (e.g., mild, moderate, severe)    - MILD: No SOB at rest, mild SOB with walking, speaks normally in sentences, can lay down, no retractions, pulse < 100.    - MODERATE: SOB at rest, SOB with minimal exertion and prefers to sit, cannot lie down flat, speaks in phrases, mild retractions, audible wheezing, pulse 100-120.    - SEVERE: Very SOB at rest, speaks in single words, struggling to breathe, sitting hunched forward, retractions, pulse > 120      Worse with exertion, moderate 5. RECURRENT SYMPTOM: "Have you had difficulty breathing before?" If so, ask: "When was the last time?" and "What happened that time?"      H/O asthma and emphysema 6. CARDIAC HISTORY: "Do you have any history of heart disease?" (e.g., heart attack, angina, bypass surgery, angioplasty)     no 7. LUNG HISTORY: "Do you have any history of lung disease?"  (e.g., pulmonary embolus, asthma, emphysema)     COPD, asthma 8. CAUSE: "What do you think is causing the breathing problem?"      Asthma 9. OTHER SYMPTOMS: "Do you have any other symptoms? (e.g., dizziness, runny nose, cough, chest pain, fever)     Runny nose, sore throat, eyes red and teary, prouctive cough, yellow green  VEL: "Have you traveled out of the country in the last month?" (e.g., travel history, exposures)       no  Protocols used: BREATHING DIFFICULTY-A-AH

## 2019-05-10 ENCOUNTER — Telehealth: Payer: 59 | Admitting: Family Medicine

## 2019-05-10 ENCOUNTER — Other Ambulatory Visit: Payer: Self-pay

## 2019-05-21 ENCOUNTER — Telehealth: Payer: Self-pay | Admitting: Family Medicine

## 2019-05-21 ENCOUNTER — Other Ambulatory Visit: Payer: Self-pay

## 2019-05-21 ENCOUNTER — Other Ambulatory Visit: Payer: Self-pay | Admitting: Family Medicine

## 2019-05-21 DIAGNOSIS — M2550 Pain in unspecified joint: Secondary | ICD-10-CM

## 2019-05-21 DIAGNOSIS — R21 Rash and other nonspecific skin eruption: Secondary | ICD-10-CM

## 2019-05-21 MED ORDER — GABAPENTIN 400 MG PO CAPS: 400.0000 mg | ORAL_CAPSULE | Freq: Three times a day (TID) | ORAL | 1 refills | Status: DC

## 2019-05-21 MED ORDER — TIZANIDINE HCL 4 MG PO TABS: ORAL_TABLET | ORAL | 3 refills | Status: DC

## 2019-05-21 MED ORDER — TRIAMCINOLONE ACETONIDE 0.1 % EX CREA: TOPICAL_CREAM | CUTANEOUS | 0 refills | Status: DC

## 2019-05-21 NOTE — Telephone Encounter (Signed)
neurontin 400 mg #30 with 1 refill, tizanidine 4 mg once daily #30 with 3 refills(replacing robaxin) and triamincinolone cream #1 with 0 refills sent to [facility] .  Will contact pt for f/u appt with [facility]. Dgaddy, CMA

## 2019-05-21 NOTE — Telephone Encounter (Signed)
Spoke with pt again re: premarin cream an advised Dr. Nolon Rod would like to have virtual visit with her before prescribing the premarin.  Advised dr Nolon Rod will discuss side effects and precautions with this medication at virtual visit before prescribing.  Pt agreeable with course of action in this matter. Dgaddy, CMA

## 2019-05-21 NOTE — Telephone Encounter (Signed)
Copied from [place] [id number]. Topic: Quick Communication - Rx Refill/Question >> May 21, 2019 11:24 AM Loma Boston wrote: CRM for notification. See Telephone encounter for: 05/21/19.methocarbamol (ROBAXIN) 750 MG tablet This med is not covered by pt insurance. Pharmacy called and there are 2 options other than tis. One (1) Tizanidine or (2) Flurbiprosen. The pharmacy handling this is [facility] or any can handle.methocarbamol

## 2019-05-21 NOTE — Telephone Encounter (Signed)
Pt scheduled for 06/01/2019 at 9am(telemed visit)but is requesting a refill on premarin cream for vaginal dryness prescribed in the past by Dr. Tamala Julian.  Advised I would send message to [facility]ings and call her back with her response. Pt agreeable with course of action and appt. Dgaddy, CMA

## 2019-05-21 NOTE — Telephone Encounter (Signed)
Copied from [place] [id number]. Topic: Quick Communication - See Telephone Encounter >> May 21, 2019 11:38 AM Loma Boston wrote:  Topic: Quick Communication - Rx Refill/Question >> May 21, 2019 11:24 AM Loma Boston wrote: CRM for notification. See Telephone encounter for: 05/21/19.methocarbamol (ROBAXIN) 750 MG tablet This med is not covered by pt insurance. Pharmacy called and there are 2 options other than this. One (1) Tizanidine or (2) Flurbiprosen. The pharmacy handling this is [facility] or any can handle. [facility] [address], [place] [phone] (Phone) [phone] (Fax)

## 2019-05-21 NOTE — Telephone Encounter (Signed)
Copied from [place] [id number]. Topic: Quick Communication - Rx Refill/Question >> May 21, 2019 11:00 AM Leward Quan A wrote: Medication: gabapentin (NEURONTIN) 400 MG capsule, Flexeril, Halobetasol Propionate Cream 0.05%  Also requesting cream to aide with vaginal dryness during sexual intercourse.  Has the patient contacted their pharmacy? Yes.   (Agent: If no, request that the patient contact the pharmacy for the refill.) (Agent: If yes, when and what did the pharmacy advise?)  Preferred Pharmacy (with phone number or street name): [facility], [place] [phone] (Phone) [phone] (Fax)    Agent: Please be advised that RX refills may take up to 3 business days. We ask that you follow-up with your pharmacy.

## 2019-05-21 NOTE — Telephone Encounter (Signed)
Per Nolon Rod tizanidine 4 mg #30 with 3 refills since insurance will cover this brand instead of methocarbamol(robaxin). Will refill triamcinolone cream and gabapentin 400 mg #30 with 1 refill.   Advised to get pt in for ov or telemedicine visit to f/u as pt hasn't been in office in a while. Dgaddy, CMA

## 2019-05-22 ENCOUNTER — Other Ambulatory Visit: Payer: Self-pay | Admitting: Family Medicine

## 2019-05-23 NOTE — Telephone Encounter (Signed)
Requested medication (s) are due for refill today:  No   Requesting too early  Requested medication (s) are on the active medication list:   Yes  Future visit scheduled:   Yes with Dr. Nolon Rod 06/01/2019   Last ordered: 03/28/2019  18 g  With 5 refills     Requested Prescriptions  Pending Prescriptions Disp Refills   PROAIR HFA 108 (90 Base) MCG/ACT inhaler [Pharmacy Med Name: PROAIR HFA 90 MCG/INH AER] 8.5 each 0    Sig: INHALE 2 PUFFS BY MOUTH EVERY 6 HOURS AS NEEDED FOR SHORTNESS OF BREATH     Pulmonology:  Beta Agonists Failed - 05/22/2019 10:18 PM      Failed - One inhaler should last at least one month. If the patient is requesting refills earlier, contact the patient to check for uncontrolled symptoms.      Passed - Valid encounter within last 12 months    Recent Outpatient Visits          3 months ago Chronic left shoulder pain   [facility] at [facility], Lilia Argue, MD   4 months ago Essential hypertension   [facility] at [facility], Lilia Argue, MD   8 months ago Diaphragmatic hernia without obstruction or gangrene   Primary Care at [facility], Arlie Solomons, MD   10 months ago Degenerative disc disease, lumbar   Primary Care at [facility], Renette Butters, MD   11 months ago Chronic arthralgias of knees and hips   Primary Care at [facility], Renette Butters, MD

## 2019-06-01 ENCOUNTER — Other Ambulatory Visit: Payer: Self-pay

## 2019-06-01 ENCOUNTER — Telehealth: Payer: 59 | Admitting: Family Medicine

## 2019-06-04 ENCOUNTER — Telehealth: Payer: Self-pay | Admitting: Family Medicine

## 2019-06-04 NOTE — Telephone Encounter (Signed)
Copied from [place] [id number]. Topic: General - Inquiry >> Jun 04, 2019  8:21 AM Richardo Priest, NT wrote: Reason for CRM: Patient calling in stating she is needing her BP and muscle spasm medications. Patient is also concerned she may have an STD. States her and her partner have been active. Patient states she noticed a fishy smell, pain in bottom of stomach, and burning when she urinated about 4-5 days ago. Also states her partner has some redness at the end of his penis and also burns while he urinates. States she is currently out of town and does not have the means to come back to town or do a virtual visit. Please advise. Call back is [phone].

## 2019-06-04 NOTE — Telephone Encounter (Signed)
Spoke with pt via phone that we will refill the triametrene-hctz but not the robaxin as she will need an office visit.  Per pt she is out of town and won't be back possibly until the 1st part of July.  She advises [facility] is suppose to send refill request for medications and they will let her know when they have received request back and she will give them the temporary address where she is in [place].   Advised if she feels she has a STD then she will need to go to [facility] there for treatment.  Pt agreeable.  Tried to schedule in office visit for her today but declines as she is not sure exactly the date of her return to [place].  Advised to contact me when she returns and we will look at Dr. Nolon Rod schedule to accommodate.  Pt agreeable.  Will wait for request from [facility]. Dgaddy, CMA

## 2019-06-05 ENCOUNTER — Encounter: Payer: Self-pay | Admitting: Family Medicine

## 2019-06-13 ENCOUNTER — Telehealth: Payer: Self-pay | Admitting: Family Medicine

## 2019-06-13 NOTE — Telephone Encounter (Signed)
Copied from [place] [id number]. Topic: Quick Communication - Rx Refill/Question >> Jun 13, 2019  3:10 PM Leward Quan A wrote: Medication: amLODipine (NORVASC) 10 MG tablet, Halobetasol, Doloxetine  Has the patient contacted their pharmacy? Yes.   (Agent: If no, request that the patient contact the pharmacy for the refill.) (Agent: If yes, when and what did the pharmacy advise?)  Preferred Pharmacy (with phone number or street name): [facility] - [address], [place] - [place] [phone] (Phone) [phone] (Fax)    Agent: Please be advised that RX refills may take up to 3 business days. We ask that you follow-up with your pharmacy.

## 2019-06-16 NOTE — Telephone Encounter (Signed)
Please clarify the duloxetine. She has not been in to the office as instructed.

## 2019-06-19 ENCOUNTER — Other Ambulatory Visit: Payer: Self-pay | Admitting: Family Medicine

## 2019-06-19 ENCOUNTER — Other Ambulatory Visit: Payer: Self-pay | Admitting: Neurology

## 2019-06-19 DIAGNOSIS — R21 Rash and other nonspecific skin eruption: Secondary | ICD-10-CM

## 2019-06-20 ENCOUNTER — Encounter: Payer: Self-pay | Admitting: Family Medicine

## 2019-06-20 ENCOUNTER — Other Ambulatory Visit: Payer: Self-pay

## 2019-06-20 ENCOUNTER — Other Ambulatory Visit: Payer: Self-pay | Admitting: Family Medicine

## 2019-06-20 ENCOUNTER — Other Ambulatory Visit (HOSPITAL_COMMUNITY)
Admission: RE | Admit: 2019-06-20 | Discharge: 2019-06-20 | Disposition: A | Discharge status: Home / Self Care | Payer: 59 | Source: Ambulatory Visit | Attending: Family Medicine | Admitting: Family Medicine

## 2019-06-20 ENCOUNTER — Ambulatory Visit (INDEPENDENT_AMBULATORY_CARE_PROVIDER_SITE_OTHER): Payer: 59 | Admitting: Family Medicine

## 2019-06-20 VITALS — BP 121/72 | HR 91 | Temp 98.0°F | Resp 16 | Wt 115.0 lb

## 2019-06-20 DIAGNOSIS — R300 Dysuria: Secondary | ICD-10-CM | POA: Insufficient documentation

## 2019-06-20 DIAGNOSIS — N760 Acute vaginitis: Secondary | ICD-10-CM | POA: Diagnosis not present

## 2019-06-20 DIAGNOSIS — I10 Essential (primary) hypertension: Secondary | ICD-10-CM

## 2019-06-20 DIAGNOSIS — Z113 Encounter for screening for infections with a predominantly sexual mode of transmission: Secondary | ICD-10-CM

## 2019-06-20 DIAGNOSIS — N898 Other specified noninflammatory disorders of vagina: Secondary | ICD-10-CM | POA: Diagnosis not present

## 2019-06-20 DIAGNOSIS — B9689 Other specified bacterial agents as the cause of diseases classified elsewhere: Secondary | ICD-10-CM

## 2019-06-20 DIAGNOSIS — M545 Low back pain, unspecified: Secondary | ICD-10-CM

## 2019-06-20 DIAGNOSIS — G8929 Other chronic pain: Secondary | ICD-10-CM

## 2019-06-20 DIAGNOSIS — R319 Hematuria, unspecified: Secondary | ICD-10-CM

## 2019-06-20 LAB — POCT URINALYSIS DIP (MANUAL ENTRY)
Bilirubin, UA: NEGATIVE
Glucose, UA: NEGATIVE mg/dL
Ketones, POC UA: NEGATIVE mg/dL
Leukocytes, UA: NEGATIVE
Nitrite, UA: NEGATIVE
Protein Ur, POC: NEGATIVE mg/dL
Spec Grav, UA: 1.01 (ref 1.010–1.025)
Urobilinogen, UA: 0.2 E.U./dL
pH, UA: 6 (ref 5.0–8.0)

## 2019-06-20 LAB — POCT WET + KOH PREP
Trich by wet prep: ABSENT
Yeast by KOH: ABSENT
Yeast by wet prep: ABSENT

## 2019-06-20 MED ORDER — METRONIDAZOLE 500 MG PO TABS: 500.0000 mg | ORAL_TABLET | Freq: Two times a day (BID) | ORAL | 0 refills | Status: DC

## 2019-06-20 MED ORDER — AMLODIPINE BESYLATE 10 MG PO TABS: 10.0000 mg | ORAL_TABLET | Freq: Every day | ORAL | 1 refills | Status: DC

## 2019-06-20 MED ORDER — METHOCARBAMOL 750 MG PO TABS: 750.0000 mg | ORAL_TABLET | Freq: Three times a day (TID) | ORAL | 1 refills | Status: DC

## 2019-06-20 NOTE — Progress Notes (Signed)
Acute Office Visit  Subjective:    Patient ID: Melissa Bridges, female    DOB: 13-Mar-1961, 58 y.o.   MRN: [id number]  Chief Complaint  Patient presents with  . Dysuria    x 1 weeks with discharge   . Medication Refill    amlodipine and robaxin     HPI Patient is in today for pain with urination for 2 week-first morning urine.  No blood.  Urine is yellow.  Pt states diagnosed with BV and flagyl given -only 2 doses taken due to medication left at sisters house Pt noted odor after intercourse over last 2 week. Pt with new partner.  Pt states partner noted tingling in the penis. Pt states partner with chlamydia -5 months ago-treated  Past Medical History:  Diagnosis Date  . Allergy   . Alopecia   . Anxiety   . Arthritis   . Asthma   . Cataract    "mild"  . COPD (chronic obstructive pulmonary disease) ([place])   . Depression   . GERD (gastroesophageal reflux disease)    o cc- takes OTC if needed.  . Glaucoma   . Hepatitis C   . Hypertension    onset age 26.  . Iron deficiency anemia   . Migraine   . Schizoaffective disorder ([place])   . Sciatic pain   . Seizures ([place])    onset in childhood. 06-03-16- per pt 8 months agoGeneralized tonic-clonic.Last seizure couple of months ago- last sz 9-10 months ago per pt 10-12-2016  . Shortness of breath dyspnea   . Substance abuse ([place])   . Ulcer     Past Surgical History:  Procedure Laterality Date  . ABDOMINAL HYSTERECTOMY     ovarian cyst B, cervical dysplasia, fibroids.   Ovaries intact.  [name] BIOPSY  07/11/2018   Procedure: BIOPSY;  Surgeon: Yetta Flock, MD;  Location: [facility];  Service: Gastroenterology;;  . COLONOSCOPY    . COLONOSCOPY W/ BIOPSIES    . DILATION AND CURETTAGE OF UTERUS     x2  . ESOPHAGOGASTRODUODENOSCOPY (EGD) WITH PROPOFOL N/A 07/11/2018   Procedure: ESOPHAGOGASTRODUODENOSCOPY (EGD) WITH PROPOFOL;  Surgeon: Yetta Flock, MD;  Location: [facility];  Service: Gastroenterology;  Laterality:  N/A;  . EXPLORATORY LAPAROTOMY     x3  . GYNECOLOGIC CRYOSURGERY     x3  . HOT HEMOSTASIS N/A 07/11/2018   Procedure: HOT HEMOSTASIS (ARGON PLASMA COAGULATION/BICAP);  Surgeon: Yetta Flock, MD;  Location: [facility];  Service: Gastroenterology;  Laterality: N/A;  . MANDIBLE RECONSTRUCTION N/A 06/27/2015   Procedure: REMOVAL MAXILLARY PALATAL TORUS.  REMOVAL MANDIBULAR TORUS AND EXOSTOSIS.  ;  Surgeon: Diona Browner, DDS;  Location: [facility];  Service: Oral Surgery;  Laterality: N/A;  . OVARIAN CYST REMOVAL  2008  . POLYPECTOMY      Family History  Problem Relation Age of Onset  . Diabetes type II Mother   . Hypertension Mother   . Arthritis Mother   . Diabetes Mother   . Mental illness Mother        bipolar  . Diabetes type II Maternal Aunt   . Hypertension Father   . Heart murmur Father   . Arthritis Father   . Stroke Father   . Alopecia Sister   . Alopecia Brother   . Alopecia Sister   . Mental retardation Sister        depression  . Prostate cancer Paternal Grandfather   . Cancer Maternal Uncle   . Colon cancer  Neg Hx   . Esophageal cancer Neg Hx   . Rectal cancer Neg Hx   . Stomach cancer Neg Hx   . Colon polyps Neg Hx     Social History   Socioeconomic History  . Marital status: Widowed    Spouse name: Not on file  . Number of children: 1  . Years of education: Not on file  . Highest education level: Not on file  Occupational History  . Occupation: disabled    Comment: mental illness; seizures  Social Needs  . Financial resource strain: Not on file  . Food insecurity    Worry: Not on file    Inability: Not on file  . Transportation needs    Medical: Not on file    Non-medical: Not on file  Tobacco Use  . Smoking status: Current Every Day Smoker    Packs/day: 0.33    Years: 44.00    Pack years: 14.52    Types: Cigarettes  . Smokeless tobacco: Never Used  Substance and Sexual Activity  . Alcohol use: Yes    Alcohol/week: 0.0 standard drinks     Comment: occasionally  . Drug use: Yes    Types: Marijuana, Cocaine  . Sexual activity: Not Currently    Birth control/protection: None    Comment: widow  Lifestyle  . Physical activity    Days per week: Not on file    Minutes per session: Not on file  . Stress: Not on file  Relationships  . Social Herbalist on phone: Not on file    Gets together: Not on file    Attends religious service: Not on file    Active member of club or organization: Not on file    Attends meetings of clubs or organizations: Not on file    Relationship status: Not on file  . Intimate partner violence    Fear of current or ex partner: Not on file    Emotionally abused: Not on file    Physically abused: Not on file    Forced sexual activity: Not on file  Other Topics Concern  . Not on file  Social History Narrative   Marital status:  Widowed since 2002.  Married x 16 years. + dating.  Moved from [place] to live with daughter in 2013.     Children:  One child/daughter (33); two grandchildren.      Lives: with daughter, grandchildren 2.  Does not drive due to epilepsy.      Employment:  Disability for schizoaffective disorder.      Tobacco: 1 ppd x since 8th grade.      Alcohol:  Social; rare drinking due to seizure medications.  Weekends.       Drugs: none; previous use of marijuana.  Previous iv drug use, cocaine.      Exercise: none      Seatbelt:  100%      Guns: none    Outpatient Medications Prior to Visit  Medication Sig Dispense Refill  . acetaminophen (TYLENOL) 500 MG tablet Take 500 mg by mouth once.    [name] albuterol (PROAIR HFA) 108 (90 Base) MCG/ACT inhaler INHALE 2 PUFFS BY MOUTH EVERY 6 HOURS AS NEEDED FOR SHORTNESS OF BREATH 8.5 each 0  . amLODipine (NORVASC) 10 MG tablet Take 1 tablet (10 mg total) by mouth daily. 30 tablet 1  . budesonide-formoterol (SYMBICORT) 160-4.5 MCG/ACT inhaler Inhale 2 puffs into the lungs 2 (two) times daily. 2 Inhaler 3  .  citalopram  (CELEXA) 10 MG tablet Take 1 tablet (10 mg total) by mouth daily. 30 tablet 4  . diclofenac sodium (VOLTAREN) 1 % GEL Apply 2 g topically 4 (four) times daily. 1 Tube 0  . diphenhydrAMINE-APAP, sleep, (TYLENOL PM EXTRA STRENGTH PO) Take 500 mg by mouth once.     . ferrous sulfate 325 (65 FE) MG tablet Take 1 tablet (325 mg total) by mouth 2 (two) times daily with a meal. 557 tablet 1  . folic acid (FOLVITE) 1 MG tablet Take 1 tablet (1 mg total) by mouth daily. 30 tablet 2  . gabapentin (NEURONTIN) 400 MG capsule Take 1 capsule (400 mg total) by mouth 3 (three) times daily. 270 capsule 1  . ipratropium-albuterol (DUONEB) 0.5-2.5 (3) MG/3ML SOLN Take 3 mLs by nebulization every 6 (six) hours as needed (shortness of breath). Reported on 12/02/2015 360 mL 1  . lamoTRIgine (LAMICTAL) 25 MG tablet     . methocarbamol (ROBAXIN) 750 MG tablet Take 1 tablet (750 mg total) by mouth 3 (three) times daily. 90 tablet 1  . Multiple Vitamin (MULTIVITAMIN WITH MINERALS) TABS tablet Take 1 tablet by mouth daily. 30 tablet 1  . nicotine (NICODERM CQ - DOSED IN MG/24 HOURS) 14 mg/24hr patch Place 1 patch (14 mg total) onto the skin daily. 28 patch 0  . ondansetron (ZOFRAN) 4 MG tablet TAKE 1 TABLET BY MOUTH EVERY 6 HOURS AS NEEDED FOR NAUSEA 20 tablet 0  . pantoprazole (PROTONIX) 40 MG tablet     . polyethylene glycol (MIRALAX) packet Take 17 g by mouth daily. 30 each 3  . QUEtiapine (SEROQUEL) 50 MG tablet Take 3 tablets (150 mg total) by mouth at bedtime. 90 tablet 3  . senna-docusate (SENOKOT-S) 8.6-50 MG tablet Take 2 tablets by mouth at bedtime. 60 tablet 2  . thiamine 100 MG tablet Take 1 tablet (100 mg total) by mouth daily. 90 tablet 1  . tiZANidine (ZANAFLEX) 4 MG tablet Take one tablet daily 30 tablet 3  . triamcinolone cream (KENALOG) 0.1 % APPLY CREAM EXTERNALLY TWICE DAILY 30 g 0  . triamterene-hydrochlorothiazide (MAXZIDE-25) 37.5-25 MG tablet Take 1 tablet by mouth daily. 90 tablet 0  . valACYclovir  (VALTREX) 1000 MG tablet Take 2 tabs po at symptom onset, repeat in 12 hours 20 tablet 3  . amoxicillin (AMOXIL) 875 MG tablet Take 1 tablet (875 mg total) by mouth 2 (two) times daily. 20 tablet 0   No facility-administered medications prior to visit.     Allergies  Allergen Reactions  . Fruit & Vegetable Daily [Nutritional Supplements] Shortness Of Breath    Aloe  . Iodinated Diagnostic Agents Swelling    Swelling and itching of left side of her face only after CT SI injection(no steroid used)  . Aloe Vera Rash    Review of Systems  Constitutional: Negative for fever.  Gastrointestinal: Negative for abdominal pain.  Genitourinary: Positive for dysuria, hematuria and urgency.  vaginal discharge     Objective:    Physical Exam  Constitutional: She appears well-developed and well-nourished. No distress.  Abdominal: Soft. Bowel sounds are normal. There is no abdominal tenderness.  Genitourinary:    Vaginal discharge present.   Neurological: She is alert.  no pain with bimanual exam No lesions noted  BP 121/72   Pulse 91   Temp 98 F (36.7 C) (Oral)   Resp 16   Wt 115 lb (52.2 kg)   SpO2 98%   BMI 18.70 kg/m  Wt Readings  from Last 3 Encounters:  06/20/19 115 lb (52.2 kg)  02/05/19 111 lb 6.4 oz (50.5 kg)  01/15/19 112 lb 6.4 oz (51 kg)    Health Maintenance Due  Topic Date Due  . MAMMOGRAM  09/03/2018  . PAP SMEAR-Modifier  09/28/2018  . COLONOSCOPY  06/04/2019    Lab Results  Component Value Date   TSH 0.992 05/12/2014   Lab Results  Component Value Date   WBC 6.0 08/28/2018   HGB 13.3 08/28/2018   HCT 39.2 08/28/2018   MCV 103 (H) 08/28/2018   PLT 195 08/28/2018   Lab Results  Component Value Date   NA 141 08/28/2018   K 3.8 08/28/2018   CO2 23 08/28/2018   GLUCOSE 96 08/28/2018   BUN 14 08/28/2018   CREATININE 0.82 08/28/2018   BILITOT 0.5 08/28/2018   ALKPHOS 116 08/28/2018   AST 87 (H) 08/28/2018   ALT 68 (H) 08/28/2018   PROT 8.0  08/28/2018   ALBUMIN 4.7 08/28/2018   CALCIUM 10.3 (H) 08/28/2018   ANIONGAP 6 07/14/2018   Lab Results  Component Value Date   CHOL 172 06/13/2018   Lab Results  Component Value Date   HDL 40 06/13/2018   Lab Results  Component Value Date   LDLCALC 109 (H) 06/13/2018   Lab Results  Component Value Date   TRIG 113 06/13/2018   Lab Results  Component Value Date   CHOLHDL 4.3 06/13/2018   Lab Results  Component Value Date   HGBA1C 5.8 (H) 04/18/2013       Assessment & Plan:   Problem List Items Addressed This Visit    None    Visit Diagnoses    Screening examination for STD (sexually transmitted disease)    -  Primary   Relevant Orders   Cervicovaginal ancillary only   Vaginal discharge       Relevant Orders   POCT Wet + KOH Prep   Dysuria       Relevant Orders   POCT urinalysis dipstick     14. Bacterial vaginosis +h/o partner with STD-pending GC/C results H/o clue cells noted on wet prep-flagyl-rx  5. Hematuria of unknown cause - Urine Culture-blood noted-no antibiotics given -burning with urination  6. Essential hypertension Amlodipine-rx-pt to f/u with Dr. Greg Cutter of meds for 30 days  7. Chronic bilateral low back pain, unspecified whether sciatica present Robaxin-rx-d/w pt risk/side effect of medication  Partner reports burning on penis-herpes culture pending No lesions noted  LISA Hannah Beat, MD

## 2019-06-20 NOTE — Patient Instructions (Signed)
° ° ° °  If you have lab work done today you will be contacted with your lab results within the next 2 weeks.  If you have not heard from us then please contact us. The fastest way to get your results is to register for My Chart. ° ° °IF you received an x-ray today, you will receive an invoice from [facility]. Please contact [facility] at [phone] with questions or concerns regarding your invoice.  ° °IF you received labwork today, you will receive an invoice from [facility]. Please contact [facility] at [phone] with questions or concerns regarding your invoice.  ° °Our billing staff will not be able to assist you with questions regarding bills from these companies. ° °You will be contacted with the lab results as soon as they are available. The fastest way to get your results is to activate your My Chart account. Instructions are located on the last page of this paperwork. If you have not heard from us regarding the results in 2 weeks, please contact this office. °  ° ° ° °

## 2019-06-21 LAB — URINE CULTURE

## 2019-06-22 LAB — CERVICOVAGINAL ANCILLARY ONLY
Chlamydia: NEGATIVE
Neisseria Gonorrhea: NEGATIVE
Trichomonas: NEGATIVE

## 2019-06-22 LAB — HERPES SIMPLEX VIRUS CULTURE

## 2019-06-28 ENCOUNTER — Telehealth: Payer: Self-pay | Admitting: Family Medicine

## 2019-06-28 NOTE — Telephone Encounter (Signed)
Copied from [facility] [id number]. Topic: General - Other >> Jun 28, 2019 11:18 AM Ivar Drape wrote: Reason for CRM:   Patient would like a call back with the results of her resent labs.

## 2019-07-04 ENCOUNTER — Encounter: Payer: Self-pay | Admitting: Emergency Medicine

## 2019-07-04 NOTE — Progress Notes (Signed)
Unable to reach patient after several attempts. Lab letter has been sent via mail.

## 2019-07-09 NOTE — Telephone Encounter (Signed)
Spoke to patient will lab results.

## 2019-07-12 ENCOUNTER — Other Ambulatory Visit: Payer: Self-pay | Admitting: Family Medicine

## 2019-07-12 NOTE — Telephone Encounter (Signed)
Forwarding medication refill request to Clinical Pool for review.

## 2019-07-13 ENCOUNTER — Other Ambulatory Visit: Payer: Self-pay | Admitting: Family Medicine

## 2019-07-13 NOTE — Telephone Encounter (Signed)
Requested medications are due for refill today?  Yes  Requested medications are on the active medication list?  Yes, but historically entered.    Last refill N/A  Future visit scheduled?  Yes - 09/05/2019  Notes to clinic- Protonix does not appear to have been prescribed to patient by any provider at [facility] previously.  Will forward to their refill pool to advise.   Requested Prescriptions  Pending Prescriptions Disp Refills   pantoprazole (PROTONIX) 40 MG tablet [Pharmacy Med Name: PANTOPRAZOLE 40MG  DR TAB] 60 tablet     Sig: TAKE 1 TABLET BY MOUTH TWICE DAILY BEFORE A MEAL     Gastroenterology: Proton Pump Inhibitors Passed - 07/13/2019  2:43 PM      Passed - Valid encounter within last 12 months    Recent Outpatient Visits          3 weeks ago Screening examination for STD (sexually transmitted disease)   [facility], Rex Kras, MD   5 months ago Chronic left shoulder pain   [facility], Lilia Argue, MD   5 months ago Essential hypertension   [facility], Lilia Argue, MD   10 months ago Diaphragmatic hernia without obstruction or gangrene   [facility], Arlie Solomons, MD   1 year ago Degenerative disc disease, lumbar   [facility], Renette Butters, MD      Future Appointments            In 1 month Forrest Moron, MD [facility] [place]

## 2019-08-22 ENCOUNTER — Other Ambulatory Visit: Payer: Self-pay | Admitting: Neurology

## 2019-08-22 ENCOUNTER — Telehealth: Payer: Self-pay | Admitting: Family Medicine

## 2019-08-22 ENCOUNTER — Other Ambulatory Visit: Payer: Self-pay | Admitting: Family Medicine

## 2019-08-22 NOTE — Telephone Encounter (Signed)
Forwarding medication refill request to the clinical pool for review. 

## 2019-08-22 NOTE — Telephone Encounter (Signed)
Called pt LVM to cll [place] back because   08/21/19. Lunch call pt was trying to sch a med refill appt. Call back [phone] Darrick Meigs her daughter needs to make appt.  FR

## 2019-08-29 ENCOUNTER — Encounter: Payer: Self-pay | Admitting: Family Medicine

## 2019-08-29 ENCOUNTER — Ambulatory Visit (INDEPENDENT_AMBULATORY_CARE_PROVIDER_SITE_OTHER): Payer: 59 | Admitting: Family Medicine

## 2019-08-29 ENCOUNTER — Other Ambulatory Visit: Payer: Self-pay

## 2019-08-29 VITALS — BP 98/58 | HR 77 | Temp 97.7°F | Resp 18 | Ht 65.75 in | Wt 114.2 lb

## 2019-08-29 DIAGNOSIS — J431 Panlobular emphysema: Secondary | ICD-10-CM

## 2019-08-29 DIAGNOSIS — R42 Dizziness and giddiness: Secondary | ICD-10-CM

## 2019-08-29 DIAGNOSIS — E2839 Other primary ovarian failure: Secondary | ICD-10-CM | POA: Diagnosis not present

## 2019-08-29 DIAGNOSIS — R202 Paresthesia of skin: Secondary | ICD-10-CM

## 2019-08-29 DIAGNOSIS — M5136 Other intervertebral disc degeneration, lumbar region: Secondary | ICD-10-CM

## 2019-08-29 DIAGNOSIS — G894 Chronic pain syndrome: Secondary | ICD-10-CM

## 2019-08-29 DIAGNOSIS — R200 Anesthesia of skin: Secondary | ICD-10-CM | POA: Diagnosis not present

## 2019-08-29 DIAGNOSIS — E559 Vitamin D deficiency, unspecified: Secondary | ICD-10-CM

## 2019-08-29 DIAGNOSIS — F558 Abuse of other non-psychoactive substances: Secondary | ICD-10-CM

## 2019-08-29 DIAGNOSIS — I9589 Other hypotension: Secondary | ICD-10-CM | POA: Diagnosis not present

## 2019-08-29 DIAGNOSIS — Z1239 Encounter for other screening for malignant neoplasm of breast: Secondary | ICD-10-CM

## 2019-08-29 DIAGNOSIS — F17200 Nicotine dependence, unspecified, uncomplicated: Secondary | ICD-10-CM

## 2019-08-29 LAB — POCT CBC
Granulocyte percent: 75.3 %G (ref 37–80)
HCT, POC: 37.6 % (ref 29–41)
Hemoglobin: 13 g/dL (ref 11–14.6)
Lymph, poc: 1.3 (ref 0.6–3.4)
MCH, POC: 36 pg — AB (ref 27–31.2)
MCHC: 34.6 g/dL (ref 31.8–35.4)
MCV: 104.2 fL (ref 76–111)
MID (cbc): 0.2 (ref 0–0.9)
MPV: 7.5 fL (ref 0–99.8)
POC Granulocyte: 4.5 (ref 2–6.9)
POC LYMPH PERCENT: 21.3 %L (ref 10–50)
POC MID %: 3.4 %M (ref 0–12)
Platelet Count, POC: 153 10*3/uL (ref 142–424)
RBC: 3.61 M/uL — AB (ref 4.04–5.48)
RDW, POC: 13.3 %
WBC: 6 10*3/uL (ref 4.6–10.2)

## 2019-08-29 LAB — POCT GLYCOSYLATED HEMOGLOBIN (HGB A1C): Hemoglobin A1C: 5.2 % (ref 4.0–5.6)

## 2019-08-29 LAB — GLUCOSE, POCT (MANUAL RESULT ENTRY): POC Glucose: 78 mg/dl (ref 70–99)

## 2019-08-29 MED ORDER — METHOCARBAMOL 750 MG PO TABS: 750.0000 mg | ORAL_TABLET | Freq: Three times a day (TID) | ORAL | 1 refills | Status: DC

## 2019-08-29 MED ORDER — CITALOPRAM HYDROBROMIDE 20 MG PO TABS: 20.0000 mg | ORAL_TABLET | Freq: Every day | ORAL | 0 refills | Status: DC

## 2019-08-29 NOTE — Progress Notes (Signed)
Established Patient Office Visit  Subjective:  Patient ID: Melissa Bridges, female    DOB: [birth year]  Age: 58 y.o. MRN: [id number]  CC:  Chief Complaint  Patient presents with   Medication Refill   Numbness    fingertips and L arm, gets cold and turns dark, off and on x4 months and constant numbness    HPI Melissa Bridges presents for   Hypotension: Patient here for follow-up of blood pressure. She is not exercising. She staes that she has been out of her meds for 5 days. This morning she is fasting and does not feel very good with tingling in the LEFT arm.  She states that she has fallen at home and has been losing weight.  She does not check her bp at home. She reports that she had some deaths in the family and has not been eating either.   BP Readings from Last 3 Encounters:  08/29/19 (!) 98/58  06/20/19 121/72  02/05/19 140/85    BP Readings from Last 3 Encounters:  08/29/19 (!) 98/58  06/20/19 121/72  02/05/19 140/85   She has been out of her bp meds for 5 days Wt Readings from Last 3 Encounters:  08/29/19 114 lb 3.2 oz (51.8 kg)  06/20/19 115 lb (52.2 kg)  02/05/19 111 lb 6.4 oz (50.5 kg)   Depression She reports that her nephew who is like a son to her died and she just feels overwhelmed She denies suicial thoughts but is feeling very stressed She is on celexa 63m and seroquel She sleeps well at night but feels tired and has been feeling pain all over  [facility] 08/29/2019 06/20/2019 03/28/2019 08/28/2018 07/05/2018  Decreased Interest 0 0 0 2 3  Down, Depressed, Hopeless 2 0 0 2 3  PHQ - 2 Score 2 0 0 4 6  Altered sleeping 2 - - 2 3  Tired, decreased energy 1 - - 3 3  Change in appetite 3 - - 0 1  Feeling bad or failure about yourself  2 - - 2 3  Trouble concentrating 1 - - 2 0  Moving slowly or fidgety/restless 1 - - 2 0  Suicidal thoughts 0 - - 0 0  PHQ-9 Score 12 - - 15 16  Difficult doing work/chores Very difficult - - Very difficult -    Some recent data might be hidden   Tylenol abuse She was admitted for tylenol overdose She takes acetaminophen 5026mpills She takes 4 extra strength tablets (200021m3-4 times a day She states that she tries to count the dose she does not overdose again She reports that she has chronic sciatica and low back pain    Past Medical History:  Diagnosis Date   Allergy    Alopecia    Anxiety    Arthritis    Asthma    Cataract    "mild"   COPD (chronic obstructive pulmonary disease) (HCC)    Depression    GERD (gastroesophageal reflux disease)    o cc- takes OTC if needed.   Glaucoma    Hepatitis C    Hypertension    onset age [age] Iron deficiency anemia    Migraine    Schizoaffective disorder (HCC)    Sciatic pain    Seizures (HCC)    onset in childhood. 06-03-16- per pt 8 months agoGeneralized tonic-clonic.Last seizure couple of months ago- last sz 9-10 months ago per pt 10-12-2016   Shortness of breath  dyspnea    Substance abuse ([place])    Ulcer     Past Surgical History:  Procedure Laterality Date   ABDOMINAL HYSTERECTOMY     ovarian cyst B, cervical dysplasia, fibroids.   Ovaries intact.   BIOPSY  07/11/2018   Procedure: BIOPSY;  Surgeon: Yetta Flock, MD;  Location: [facility];  Service: Gastroenterology;;   COLONOSCOPY     COLONOSCOPY W/ BIOPSIES     DILATION AND CURETTAGE OF UTERUS     x2   ESOPHAGOGASTRODUODENOSCOPY (EGD) WITH PROPOFOL N/A 07/11/2018   Procedure: ESOPHAGOGASTRODUODENOSCOPY (EGD) WITH PROPOFOL;  Surgeon: Yetta Flock, MD;  Location: [facility];  Service: Gastroenterology;  Laterality: N/A;   EXPLORATORY LAPAROTOMY     x3   GYNECOLOGIC CRYOSURGERY     x3   HOT HEMOSTASIS N/A 07/11/2018   Procedure: HOT HEMOSTASIS (ARGON PLASMA COAGULATION/BICAP);  Surgeon: Yetta Flock, MD;  Location: [facility];  Service: Gastroenterology;  Laterality: N/A;   MANDIBLE RECONSTRUCTION N/A 06/27/2015    Procedure: REMOVAL MAXILLARY PALATAL TORUS.  REMOVAL MANDIBULAR TORUS AND EXOSTOSIS.  ;  Surgeon: Diona Browner, DDS;  Location: [facility];  Service: Oral Surgery;  Laterality: N/A;   OVARIAN CYST REMOVAL  2008   POLYPECTOMY      Family History  Problem Relation Age of Onset   Diabetes type II Mother    Hypertension Mother    Arthritis Mother    Diabetes Mother    Mental illness Mother        bipolar   Diabetes type II Maternal Aunt    Hypertension Father    Heart murmur Father    Arthritis Father    Stroke Father    Alopecia Sister    Alopecia Brother    Alopecia Sister    Mental retardation Sister        depression   Prostate cancer Paternal Grandfather    Cancer Maternal Uncle    Colon cancer Neg Hx    Esophageal cancer Neg Hx    Rectal cancer Neg Hx    Stomach cancer Neg Hx    Colon polyps Neg Hx     Social History   Socioeconomic History   Marital status: Widowed    Spouse name: Not on file   Number of children: 1   Years of education: Not on file   Highest education level: Not on file  Occupational History   Occupation: disabled    Comment: mental illness; seizures  Social Designer, fashion/clothing strain: Not on file   Food insecurity    Worry: Not on file    Inability: Not on file   Transportation needs    Medical: Not on file    Non-medical: Not on file  Tobacco Use   Smoking status: Current Every Day Smoker    Packs/day: 0.33    Years: 44.00    Pack years: 14.52    Types: Cigarettes   Smokeless tobacco: Never Used  Substance and Sexual Activity   Alcohol use: Yes    Alcohol/week: 0.0 standard drinks    Comment: occasionally   Drug use: Yes    Types: Marijuana, Cocaine   Sexual activity: Not Currently    Birth control/protection: None    Comment: widow  Lifestyle   Physical activity    Days per week: Not on file    Minutes per session: Not on file   Stress: Not on file  Relationships   Social  connections    Talks  on phone: Not on file    Gets together: Not on file    Attends religious service: Not on file    Active member of club or organization: Not on file    Attends meetings of clubs or organizations: Not on file    Relationship status: Not on file   Intimate partner violence    Fear of current or ex partner: Not on file    Emotionally abused: Not on file    Physically abused: Not on file    Forced sexual activity: Not on file  Other Topics Concern   Not on file  Social History Narrative   Marital status:  Widowed since 2002.  Married x 16 years. + dating.  Moved from [place] to live with daughter in 2013.     Children:  One child/daughter (33); two grandchildren.      Lives: with daughter, grandchildren 2.  Does not drive due to epilepsy.      Employment:  Disability for schizoaffective disorder.      Tobacco: 1 ppd x since 8th grade.      Alcohol:  Social; rare drinking due to seizure medications.  Weekends.       Drugs: none; previous use of marijuana.  Previous iv drug use, cocaine.      Exercise: none      Seatbelt:  100%      Guns: none    Outpatient Medications Prior to Visit  Medication Sig Dispense Refill   acetaminophen (TYLENOL) 500 MG tablet Take 500 mg by mouth once.     albuterol (PROAIR HFA) 108 (90 Base) MCG/ACT inhaler INHALE 2 PUFFS BY MOUTH EVERY 6 HOURS AS NEEDED FOR SHORTNESS OF BREATH 8.5 g 1   budesonide-formoterol (SYMBICORT) 160-4.5 MCG/ACT inhaler Inhale 2 puffs into the lungs 2 (two) times daily. 2 Inhaler 3   diphenhydrAMINE-APAP, sleep, (TYLENOL PM EXTRA STRENGTH PO) Take 500 mg by mouth once.      ferrous sulfate 325 (65 FE) MG tablet Take 1 tablet (325 mg total) by mouth 2 (two) times daily with a meal. 948 tablet 1   folic acid (FOLVITE) 1 MG tablet Take 1 tablet (1 mg total) by mouth daily. 30 tablet 2   gabapentin (NEURONTIN) 400 MG capsule Take 1 capsule (400 mg total) by mouth 3 (three) times daily. 270 capsule 1    ipratropium-albuterol (DUONEB) 0.5-2.5 (3) MG/3ML SOLN Take 3 mLs by nebulization every 6 (six) hours as needed (shortness of breath). Reported on 12/02/2015 360 mL 1   lamoTRIgine (LAMICTAL) 25 MG tablet Take 2 tablets (50 mg total) by mouth daily. 60 tablet 0   metroNIDAZOLE (FLAGYL) 500 MG tablet Take 1 tablet (500 mg total) by mouth 2 (two) times daily. 14 tablet 0   Multiple Vitamin (MULTIVITAMIN WITH MINERALS) TABS tablet Take 1 tablet by mouth daily. 30 tablet 1   nicotine (NICODERM CQ - DOSED IN MG/24 HOURS) 14 mg/24hr patch Place 1 patch (14 mg total) onto the skin daily. 28 patch 0   ondansetron (ZOFRAN) 4 MG tablet TAKE 1 TABLET BY MOUTH EVERY 6 HOURS AS NEEDED FOR NAUSEA 20 tablet 1   pantoprazole (PROTONIX) 40 MG tablet TAKE 1 TABLET BY MOUTH TWICE DAILY BEFORE A MEAL 60 tablet 0   polyethylene glycol (MIRALAX) packet Take 17 g by mouth daily. 30 each 3   QUEtiapine (SEROQUEL) 50 MG tablet Take 3 tablets (150 mg total) by mouth at bedtime. 90 tablet 3   thiamine 100 MG tablet  Take 1 tablet (100 mg total) by mouth daily. 90 tablet 1   tiZANidine (ZANAFLEX) 4 MG tablet Take one tablet daily 30 tablet 3   triamcinolone cream (KENALOG) 0.1 % APPLY CREAM EXTERNALLY TWICE DAILY 30 g 0   valACYclovir (VALTREX) 1000 MG tablet Take 2 tabs po at symptom onset, repeat in 12 hours 20 tablet 3   amLODipine (NORVASC) 10 MG tablet Take 1 tablet (10 mg total) by mouth daily. 30 tablet 1   citalopram (CELEXA) 10 MG tablet Take 1 tablet (10 mg total) by mouth daily. 30 tablet 4   methocarbamol (ROBAXIN) 750 MG tablet Take 1 tablet (750 mg total) by mouth 3 (three) times daily. 90 tablet 1   triamterene-hydrochlorothiazide (MAXZIDE-25) 37.5-25 MG tablet Take 1 tablet by mouth daily. 90 tablet 0   diclofenac sodium (VOLTAREN) 1 % GEL Apply 2 g topically 4 (four) times daily. (Patient not taking: Reported on 08/29/2019) 1 Tube 0   senna-docusate (SENOKOT-S) 8.6-50 MG tablet Take 2 tablets  by mouth at bedtime. (Patient not taking: Reported on 08/29/2019) 60 tablet 2   No facility-administered medications prior to visit.     Allergies  Allergen Reactions   Fruit & Vegetable Daily [Nutritional Supplements] Shortness Of Breath    Aloe   Iodinated Diagnostic Agents Swelling    Swelling and itching of left side of her face only after CT SI injection(no steroid used)   Aloe Vera Rash    ROS Review of Systems Review of Systems  Constitutional: Negative for activity change, appetite change, chills and fever.  HENT: Negative for congestion, nosebleeds, trouble swallowing and voice change.   Respiratory: Negative for cough, shortness of breath and wheezing.   Gastrointestinal: Negative for diarrhea, nausea and vomiting.  Genitourinary: Negative for difficulty urinating, dysuria, flank pain and hematuria.  Musculoskeletal: see hpi Neurological: see hpi See HPI. All other review of systems negative.     Objective:     BP (!) 98/58 (BP Location: Right Arm, Patient Position: Sitting, Cuff Size: Normal)    Pulse 77    Temp 97.7 F (36.5 C) (Oral)    Resp 18    Ht 5' 5.75" (1.67 m)    Wt 114 lb 3.2 oz (51.8 kg)    SpO2 98%    BMI 18.57 kg/m  Wt Readings from Last 3 Encounters:  08/29/19 114 lb 3.2 oz (51.8 kg)  06/20/19 115 lb (52.2 kg)  02/05/19 111 lb 6.4 oz (50.5 kg)    Physical Exam  Constitutional: She is oriented to person, place, and time. She appears cachectic. She is active and cooperative.  HENT:  Head: Normocephalic and atraumatic.  Eyes: Pupils are equal, round, and reactive to light. Conjunctivae and EOM are normal.  Neck: Normal range of motion. Neck supple. No thyromegaly present.  Cardiovascular: Normal rate, regular rhythm and normal heart sounds.  Pulmonary/Chest: Effort normal and breath sounds normal. No respiratory distress. She has no wheezes.  Abdominal: Soft. Bowel sounds are normal. She exhibits no distension and no mass. There is no abdominal  tenderness. There is no rebound and no guarding.  Neurological: She is alert and oriented to person, place, and time.  Skin: Skin is warm. No rash noted. No erythema.  Psychiatric: She has a normal mood and affect. Her behavior is normal. Judgment and thought content normal.     ECG: sinus, no st elevation, twi  Health Maintenance Due  Topic Date Due   MAMMOGRAM  09/03/2018   PAP SMEAR-Modifier  09/28/2018   COLONOSCOPY  06/04/2019   INFLUENZA VACCINE  07/14/2019    There are no preventive care reminders to display for this patient.  Lab Results  Component Value Date   TSH 0.992 05/12/2014   Lab Results  Component Value Date   WBC 6.0 08/29/2019   HGB 13.0 08/29/2019   HCT 37.6 08/29/2019   MCV 104.2 08/29/2019   PLT 195 08/28/2018   Lab Results  Component Value Date   NA 141 08/28/2018   K 3.8 08/28/2018   CO2 23 08/28/2018   GLUCOSE 96 08/28/2018   BUN 14 08/28/2018   CREATININE 0.82 08/28/2018   BILITOT 0.5 08/28/2018   ALKPHOS 116 08/28/2018   AST 87 (H) 08/28/2018   ALT 68 (H) 08/28/2018   PROT 8.0 08/28/2018   ALBUMIN 4.7 08/28/2018   CALCIUM 10.3 (H) 08/28/2018   ANIONGAP 6 07/14/2018   Lab Results  Component Value Date   CHOL 172 06/13/2018   Lab Results  Component Value Date   HDL 40 06/13/2018   Lab Results  Component Value Date   LDLCALC 109 (H) 06/13/2018   Lab Results  Component Value Date   TRIG 113 06/13/2018   Lab Results  Component Value Date   CHOLHDL 4.3 06/13/2018   Lab Results  Component Value Date   HGBA1C 5.2 08/29/2019      Assessment & Plan:    Problem List Items Addressed This Visit      Respiratory   COPD (chronic obstructive pulmonary disease) ([place]) - Primary     Other   Smoker    Other Visit Diagnoses    Estrogen deficiency    - will need bone density in the chronic smoker with low BMI Body mass index is 18.57 kg/m.    Vitamin D deficiency       Relevant Orders   Vitamin D, 25-hydroxy    Numbness and tingling in left arm    -  Will have patient follow up in 2 days for monitoring and labs, no signs ACS and she reports that this is a chronic issue   Relevant Orders   CK   CMP14+EGFR   EKG 12-Lead (Completed)   Other specified hypotension    - not from bp meds since patient has been out for 5 days   Relevant Orders   EKG 12-Lead (Completed)   Dizziness and giddiness    -  bp slightly improved on reassessment.  Patient should go eat and stop taking all meds that can lower bp Will get some labs here today  ECG reassuring   Relevant Orders   POCT CBC (Completed)   POCT glucose (manual entry) (Completed)   ToxASSURE Select 13 (MW), Urine   CMP14+EGFR   POCT glycosylated hemoglobin (Hb A1C) (Completed)   EKG 12-Lead (Completed)   Degenerative disc disease, lumbar    -  Will refer to Pain Management because of risk of tylenol overdose from continued use Awaiting utox   Acetaminophen abuse    -  Concerned about patient's hypotension as well as patient's symptoms of fatigue, dizziness and giddiness Pt is at high risk for fallings   Relevant Orders   CMP14+EGFR   Acetaminophen level        Meds ordered this encounter  Medications   citalopram (CELEXA) 20 MG tablet    Sig: Take 1 tablet (20 mg total) by mouth daily.    Dispense:  90 tablet    Refill:  0   methocarbamol (  ROBAXIN) 750 MG tablet    Sig: Take 1 tablet (750 mg total) by mouth 3 (three) times daily.    Dispense:  90 tablet    Refill:  1    Follow-up: Return in about 2 days (around 08/31/2019) for nurse visit for bp check, 1 month with Dr. Nolon Rod.   A total of 50 minutes were spent face-to-face with the patient during this encounter and over half of that time was spent on counseling and coordination of care.  Forrest Moron, MD

## 2019-08-29 NOTE — Patient Instructions (Addendum)
Celexa -  Your dose has been increased to 20mg  and sent to the pharmacy  Your blood pressure is low so I did not refill your blood pressure medicaiton  Do not take any Methocarbamol (robaxin) today because your blood pressure is too low  Come in on Friday for a blood pressure check and please eat and drink before coming in.  Do  Not smoke before you get your blood pressure checked.   Go to the ER if you feel faint or the dizziness gets worse. If you heart races or your vision gets blurry.    Hypotension As your heart beats, it forces blood through your body. Hypotension, commonly called low blood pressure, is when the force of blood pumping through your arteries is too weak. Arteries are blood vessels that carry blood from the heart throughout the body. Depending on the cause and severity, hypotension may be harmless (benign) or may cause serious problems (be critical). When blood pressure is too low, you may not get enough blood to your brain or to the rest of your organs. This can cause weakness, light-headedness, rapid heartbeat, and fainting. What are the causes? This condition may be caused by:  Blood loss.  Loss of body fluids (dehydration).  Heart problems.  Hormone (endocrine) problems.  Pregnancy.  Severe infection.  Lack of certain nutrients.  Severe allergic reactions (anaphylaxis).  Certain medicines, such as blood pressure medicine or medicines that make the body lose excess fluids (diuretics). Sometimes, hypotension may be caused by not taking medicine as directed, such as taking too much of a certain medicine. What increases the risk? The following factors may make you more likely to develop this condition:  Age. Risk increases as you get older.  Conditions that affect the heart or the central nervous system.  Taking certain medicines, such as blood pressure medicine or diuretics.  Being pregnant. What are the signs or symptoms? Common symptoms of this  condition include:  Weakness.  Light-headedness.  Dizziness.  Blurred vision.  Fatigue.  Rapid heartbeat.  Fainting, in severe cases. How is this diagnosed? This condition is diagnosed based on:  Your medical history.  Your symptoms.  Your blood pressure measurement. Your health care provider will check your blood pressure when you are: ? Lying down. ? Sitting. ? Standing. A blood pressure reading is recorded as two numbers, such as "120 over 80" (or 120/80). The first ("top") number is called the systolic pressure. It is a measure of the pressure in your arteries as your heart beats. The second ("bottom") number is called the diastolic pressure. It is a measure of the pressure in your arteries when your heart relaxes between beats. Blood pressure is measured in a unit called mm Hg. Healthy blood pressure for most adults is 120/80. If your blood pressure is below 90/60, you may be diagnosed with hypotension. Other information or tests that may be used to diagnose hypotension include:  Your other vital signs, such as your heart rate and temperature.  Blood tests.  Tilt table test. For this test, you will be safely secured to a table that moves you from a lying position to an upright position. Your heart rhythm and blood pressure will be monitored during the test. How is this treated? Treatment for this condition may include:  Changing your diet. This may involve eating more salt (sodium) or drinking more water.  Taking medicines to raise your blood pressure.  Changing the dosage of certain medicines you are taking that might  be lowering your blood pressure.  Wearing compression stockings. These stockings help to prevent blood clots and reduce swelling in your legs. In some cases, you may need to go to the hospital for:  Fluid replacement. This means you will receive fluids through an IV.  Blood replacement. This means you will receive donated blood through an IV  (transfusion).  Treating an infection or heart problems, if this applies.  Monitoring. You may need to be monitored while medicines that you are taking wear off. Follow these instructions at home: Eating and drinking   Drink enough fluid to keep your urine pale yellow.  Eat a healthy diet, and follow instructions from your health care provider about eating or drinking restrictions. A healthy diet includes: ? Fresh fruits and vegetables. ? Whole grains. ? Lean meats. ? Low-fat dairy products.  Eat extra salt only as directed. Do not add extra salt to your diet unless your health care provider told you to do that.  Eat frequent, small meals.  Avoid standing up suddenly after eating. Medicines  Take over-the-counter and prescription medicines only as told by your health care provider. ? Follow instructions from your health care provider about changing the dosage of your current medicines, if this applies. ? Do not stop or adjust any of your medicines on your own. General instructions   Wear compression stockings as told by your health care provider.  Get up slowly from lying down or sitting positions. This gives your blood pressure a chance to adjust.  Avoid hot showers and excessive heat as directed by your health care provider.  Return to your normal activities as told by your health care provider. Ask your health care provider what activities are safe for you.  Do not use any products that contain nicotine or tobacco, such as cigarettes, e-cigarettes, and chewing tobacco. If you need help quitting, ask your health care provider.  Keep all follow-up visits as told by your health care provider. This is important. Contact a health care provider if you:  Vomit.  Have diarrhea.  Have a fever for more than 2-3 days.  Feel more thirsty than usual.  Feel weak and tired. Get help right away if you:  Have chest pain.  Have a fast or irregular heartbeat.  Develop  numbness in any part of your body.  Cannot move your arms or your legs.  Have trouble speaking.  Become sweaty or feel light-headed.  Faint.  Feel short of breath.  Have trouble staying awake.  Feel confused. Summary  Hypotension is when the force of blood pumping through your arteries is too weak.  Hypotension may be harmless (benign) or may cause serious problems (be critical).  Treatment for this condition may include changing your diet, changing your medicines, and wearing compression stockings.  In some cases, you may need to go to the hospital for fluid or blood replacement. This information is not intended to replace advice given to you by your health care provider. Make sure you discuss any questions you have with your health care provider. Document Released: 11/29/2005 Document Revised: 05/25/2018 Document Reviewed: 05/25/2018 Elsevier Patient Education  2020 [employer].

## 2019-08-30 LAB — CMP14+EGFR
ALT: 63 IU/L — ABNORMAL HIGH (ref 0–32)
AST: 103 IU/L — ABNORMAL HIGH (ref 0–40)
Albumin/Globulin Ratio: 1.5 (ref 1.2–2.2)
Albumin: 4.3 g/dL (ref 3.8–4.9)
Alkaline Phosphatase: 111 IU/L (ref 39–117)
BUN/Creatinine Ratio: 13 (ref 12–28)
BUN: 12 mg/dL (ref 8–27)
Bilirubin Total: 0.3 mg/dL (ref 0.0–1.2)
CO2: 23 mmol/L (ref 20–29)
Calcium: 9.7 mg/dL (ref 8.7–10.3)
Chloride: 100 mmol/L (ref 96–106)
Creatinine, Ser: 0.94 mg/dL (ref 0.57–1.00)
GFR calc Af Amer: 76 mL/min/{1.73_m2} (ref >59)
GFR calc non Af Amer: 66 mL/min/{1.73_m2} (ref >59)
Globulin, Total: 2.8 g/dL (ref 1.5–4.5)
Glucose: 79 mg/dL (ref 65–99)
Potassium: 3.8 mmol/L (ref 3.5–5.2)
Sodium: 140 mmol/L (ref 134–144)
Total Protein: 7.1 g/dL (ref 6.0–8.5)

## 2019-08-30 LAB — VITAMIN D 25 HYDROXY (VIT D DEFICIENCY, FRACTURES): Vit D, 25-Hydroxy: 32.5 ng/mL (ref 30.0–100.0)

## 2019-08-30 LAB — CK: Total CK: 203 U/L — ABNORMAL HIGH (ref 32–182)

## 2019-08-31 ENCOUNTER — Ambulatory Visit (INDEPENDENT_AMBULATORY_CARE_PROVIDER_SITE_OTHER): Payer: 59 | Admitting: Family Medicine

## 2019-08-31 ENCOUNTER — Other Ambulatory Visit: Payer: Self-pay

## 2019-08-31 VITALS — BP 118/72

## 2019-08-31 DIAGNOSIS — I10 Essential (primary) hypertension: Secondary | ICD-10-CM

## 2019-08-31 LAB — ACETAMINOPHEN LEVEL: Acetaminophen (Tylenol), S: NOT DETECTED ug/mL (ref 10–30)

## 2019-09-01 LAB — TOXASSURE SELECT 13 (MW), URINE

## 2019-09-05 ENCOUNTER — Other Ambulatory Visit: Payer: Self-pay

## 2019-09-05 ENCOUNTER — Other Ambulatory Visit: Payer: Self-pay | Admitting: Family Medicine

## 2019-09-05 ENCOUNTER — Encounter: Payer: Self-pay | Admitting: Family Medicine

## 2019-09-05 ENCOUNTER — Other Ambulatory Visit (HOSPITAL_COMMUNITY)
Admission: RE | Admit: 2019-09-05 | Discharge: 2019-09-05 | Disposition: A | Discharge status: Home / Self Care | Payer: 59 | Source: Ambulatory Visit | Attending: Family Medicine | Admitting: Family Medicine

## 2019-09-05 ENCOUNTER — Ambulatory Visit (INDEPENDENT_AMBULATORY_CARE_PROVIDER_SITE_OTHER): Payer: 59 | Admitting: Family Medicine

## 2019-09-05 VITALS — BP 150/86 | HR 86 | Temp 97.7°F | Ht 63.0 in | Wt 115.2 lb

## 2019-09-05 DIAGNOSIS — F250 Schizoaffective disorder, bipolar type: Secondary | ICD-10-CM

## 2019-09-05 DIAGNOSIS — Z113 Encounter for screening for infections with a predominantly sexual mode of transmission: Secondary | ICD-10-CM

## 2019-09-05 DIAGNOSIS — E559 Vitamin D deficiency, unspecified: Secondary | ICD-10-CM | POA: Diagnosis not present

## 2019-09-05 DIAGNOSIS — Z1151 Encounter for screening for human papillomavirus (HPV): Secondary | ICD-10-CM | POA: Insufficient documentation

## 2019-09-05 DIAGNOSIS — I10 Essential (primary) hypertension: Secondary | ICD-10-CM | POA: Diagnosis not present

## 2019-09-05 DIAGNOSIS — Z0000 Encounter for general adult medical examination without abnormal findings: Secondary | ICD-10-CM

## 2019-09-05 DIAGNOSIS — F17200 Nicotine dependence, unspecified, uncomplicated: Secondary | ICD-10-CM | POA: Diagnosis not present

## 2019-09-05 DIAGNOSIS — E2839 Other primary ovarian failure: Secondary | ICD-10-CM | POA: Diagnosis not present

## 2019-09-05 DIAGNOSIS — Z124 Encounter for screening for malignant neoplasm of cervix: Secondary | ICD-10-CM

## 2019-09-05 DIAGNOSIS — G40909 Epilepsy, unspecified, not intractable, without status epilepticus: Secondary | ICD-10-CM

## 2019-09-05 DIAGNOSIS — R636 Underweight: Secondary | ICD-10-CM

## 2019-09-05 DIAGNOSIS — F341 Dysthymic disorder: Secondary | ICD-10-CM

## 2019-09-05 DIAGNOSIS — Z0001 Encounter for general adult medical examination with abnormal findings: Secondary | ICD-10-CM | POA: Diagnosis not present

## 2019-09-05 DIAGNOSIS — Z5181 Encounter for therapeutic drug level monitoring: Secondary | ICD-10-CM

## 2019-09-05 DIAGNOSIS — Z1239 Encounter for other screening for malignant neoplasm of breast: Secondary | ICD-10-CM | POA: Diagnosis not present

## 2019-09-05 LAB — POCT WET + KOH PREP: Trich by wet prep: ABSENT

## 2019-09-05 MED ORDER — AMLODIPINE BESYLATE 5 MG PO TABS: 5.0000 mg | ORAL_TABLET | Freq: Every day | ORAL | 3 refills | Status: DC

## 2019-09-05 NOTE — Patient Instructions (Addendum)
Please follow up with Psychiatry for your depression.   Depression screen [facility] 2/9 09/05/2019 08/29/2019 06/20/2019 03/28/2019 08/28/2018  Decreased Interest 2 0 0 0 2  Down, Depressed, Hopeless 2 2 0 0 2  PHQ - 2 Score 4 2 0 0 4  Altered sleeping 1 2 - - 2  Tired, decreased energy 1 1 - - 3  Change in appetite 3 3 - - 0  Feeling bad or failure about yourself  1 2 - - 2  Trouble concentrating 2 1 - - 2  Moving slowly or fidgety/restless 2 1 - - 2  Suicidal thoughts 0 0 - - 0  PHQ-9 Score 14 12 - - 15  Difficult doing work/chores Somewhat difficult Very difficult - - Very difficult  Some recent data might be hidden      We recommend that you schedule a mammogram for breast cancer screening. Typically, you do not need a referral to do this. Please contact a local imaging center to schedule your mammogram.  [facility] - [phone]  *ask for the Radiology [place] ([place] - [phone] or [phone]  [facility] - [phone] [place] [phone] [facility] - [phone]  *ask for the [facility] - [phone]  *ask for the Radiology Department [facility] - [phone]  *ask for the [place] - [phone]    If you have lab work done today you will be contacted with your lab results within the next 2 weeks.  If you have not heard from [place] then please contact us. The fastest way to get your results is to register for My Chart.   IF you received an x-ray today, you will receive an invoice from [facility] Radiology. Please contact [facility] at [phone] with questions or concerns regarding your invoice.   IF you received labwork today, you will receive an invoice from [place]. Please contact [facility] at [phone] with questions or concerns regarding your invoice.   Our billing staff will  not be able to assist you with questions regarding bills from these companies.  You will be contacted with the lab results as soon as they are available. The fastest way to get your results is to activate your My Chart account. Instructions are located on the last page of this paperwork. If you have not heard from [place] regarding the results in 2 weeks, please contact this office.      Bone Health Bones protect organs, store calcium, anchor muscles, and support the whole body. Keeping your bones strong is important, especially as you get older. You can take actions to help keep your bones strong and healthy. Why is keeping my bones healthy important?  Keeping your bones healthy is important because your body constantly replaces bone cells. Cells get old, and new cells take their place. As we age, we lose bone cells because the body may not be able to make enough new cells to replace the old cells. The amount of bone cells and bone tissue you have is referred to as bone mass. The higher your bone mass, the stronger your bones. The aging process leads to an overall loss of bone mass in the body, which can increase the likelihood of:  Joint pain and stiffness.  Broken bones.  A condition in which the bones become weak and brittle (osteoporosis). A large decline in bone mass occurs in older  adults. In women, it occurs about the time of menopause. What actions can I take to keep my bones healthy? Good health habits are important for maintaining healthy bones. This includes eating nutritious foods and exercising regularly. To have healthy bones, you need to get enough of the right minerals and vitamins. Most nutrition experts recommend getting these nutrients from the foods that you eat. In some cases, taking supplements may also be recommended. Doing certain types of exercise is also important for bone health. What are the nutritional recommendations for healthy bones?  Eating a well-balanced diet  with plenty of calcium and vitamin D will help to protect your bones. Nutritional recommendations vary from person to person. Ask your health care provider what is healthy for you. Here are some general guidelines. Get enough calcium Calcium is the most important (essential) mineral for bone health. Most people can get enough calcium from their diet, but supplements may be recommended for people who are at risk for osteoporosis. Good sources of calcium include:  Dairy products, such as low-fat or nonfat milk, cheese, and yogurt.  Dark green leafy vegetables, such as bok choy and broccoli.  Calcium-fortified foods, such as orange juice, cereal, bread, soy beverages, and tofu products.  Nuts, such as almonds. Follow these recommended amounts for daily calcium intake:  Children, age 79-3: 700 mg.  Children, age 52-8: 1,000 mg.  Children, age 524-13: 1,300 mg.  Teens, age 25-18: 1,300 mg.  Adults, age 78-50: 1,000 mg.  Adults, age 39-70: ? Men: 1,000 mg. ? Women: 1,200 mg.  Adults, age 524 or older: 1,200 mg.  Pregnant and breastfeeding females: ? Teens: 1,300 mg. ? Adults: 1,000 mg. Get enough vitamin D Vitamin D is the most essential vitamin for bone health. It helps the body absorb calcium. Sunlight stimulates the skin to make vitamin D, so be sure to get enough sunlight. If you live in a cold climate or you do not get outside often, your health care provider may recommend that you take vitamin D supplements. Good sources of vitamin D in your diet include:  Egg yolks.  Saltwater fish.  Milk and cereal fortified with vitamin D. Follow these recommended amounts for daily vitamin D intake:  Children and teens, age 79-18: 600 international units.  Adults, age 39 or younger: 400-800 international units.  Adults, age 67 or older: 800-1,000 international units. Get other important nutrients Other nutrients that are important for bone health include:  Phosphorus. This mineral is  found in meat, poultry, dairy foods, nuts, and legumes. The recommended daily intake for adult men and adult women is 700 mg.  Magnesium. This mineral is found in seeds, nuts, dark green vegetables, and legumes. The recommended daily intake for adult men is 400-420 mg. For adult women, it is 310-320 mg.  Vitamin K. This vitamin is found in green leafy vegetables. The recommended daily intake is 120 mg for adult men and 90 mg for adult women. What type of physical activity is best for building and maintaining healthy bones? Weight-bearing and strength-building activities are important for building and maintaining healthy bones. Weight-bearing activities cause muscles and bones to work against gravity. Strength-building activities increase the strength of the muscles that support bones. Weight-bearing and muscle-building activities include:  Walking and hiking.  Jogging and running.  Dancing.  Gym exercises.  Lifting weights.  Tennis and racquetball.  Climbing stairs.  Aerobics. Adults should get at least 30 minutes of moderate physical activity on most days. Children should get at  least 60 minutes of moderate physical activity on most days. Ask your health care provider what type of exercise is best for you. How can I find out if my bone mass is low? Bone mass can be measured with an X-ray test called a bone mineral density (BMD) test. This test is recommended for all women who are age 45 or older. It may also be recommended for:  Men who are age 19 or older.  People who are at risk for osteoporosis because of: ? Having bones that break easily. ? Having a long-term disease that weakens bones, such as kidney disease or rheumatoid arthritis. ? Having menopause earlier than normal. ? Taking medicine that weakens bones, such as steroids, thyroid hormones, or hormone treatment for breast cancer or prostate cancer. ? Smoking. ? Drinking three or more alcoholic drinks a day. If you find  that you have a low bone mass, you may be able to prevent osteoporosis or further bone loss by changing your diet and lifestyle. Where can I find more information? For more information, check out the following websites:  [place]: AviationT[employer].fr  [employer] of Health: www.bones.SouthExposed.es  International Osteoporosis Foundation: Administrator.iofbonehealth.org Summary  The aging process leads to an overall loss of bone mass in the body, which can increase the likelihood of broken bones and osteoporosis.  Eating a well-balanced diet with plenty of calcium and vitamin D will help to protect your bones.  Weight-bearing and strength-building activities are also important for building and maintaining strong bones.  Bone mass can be measured with an X-ray test called a bone mineral density (BMD) test. This information is not intended to replace advice given to you by your health care provider. Make sure you discuss any questions you have with your health care provider. Document Released: 02/19/2004 Document Revised: 12/26/2017 Document Reviewed: 12/26/2017 Elsevier Patient Education  2020 [employer].

## 2019-09-05 NOTE — Progress Notes (Signed)
QUICK REFERENCE INFORMATION: The ABCs of Providing the Annual Wellness Visit  CMS.gov Medicare Learning Network  [employer] Annual Wellness Visit  Subjective:   Melissa Bridges is a 58 y.o. Female who presents for an Annual Wellness Visit.   Hypertension: Patient here for follow-up of elevated blood pressure.  BP Readings from Last 3 Encounters:  09/05/19 (!) 150/86  08/31/19 118/72  08/29/19 (!) 98/58   Patient was hypotensive on 08/29/2019 and recheck showed improved bp 08/31/2019. She was advised to hold her bp meds due to hypotension.  Today she reports that she would like to get back on her meds because she feels like might start seeing her bp go back home.  She denies chest pain and continues to have dizziness but it is better. She is still smoking and states that she is still depressed due to deaths in the family. Her daughter drove her to clinic today.    Patient Active Problem List   Diagnosis Date Noted   Screening examination for STD (sexually transmitted disease) 06/20/2019   Vaginal discharge 06/20/2019   Dysuria 06/20/2019   Bacterial vaginosis 06/20/2019   Hematuria of unknown cause 06/20/2019   Acute non-recurrent sinusitis 03/28/2019   Overdose    Melena    Suicidal ideation 07/09/2018   Anemia 07/09/2018   Cocaine abuse ([place]) 05/24/2018   Hypomagnesemia 05/24/2018   Chronic left hip pain 05/24/2018   Hypokalemia 05/23/2018   Tylenol overdose 05/23/2018   Abdominal pain 05/23/2018   COPD (chronic obstructive pulmonary disease) ([place]) 05/23/2018   Serrated adenoma of colon 08/05/2016   Localization-related idiopathic epilepsy and epileptic syndromes with seizures of localized onset, not intractable, without status epilepticus ([place]) 10/16/2015   Lumbar disc herniation 08/07/2015   Seizure disorder ([place]) 09/09/2014   Asthma with acute exacerbation 03/09/2013   Hypertension 03/09/2013   Lower back pain 03/09/2013   Chronic hepatitis C  without hepatic coma ([place]) 03/08/2013   Smoker 12/12/2012   Schizoaffective disorder, bipolar type ([place]) 12/12/2012    Past Medical History:  Diagnosis Date   Allergy    Alopecia    Anxiety    Arthritis    Asthma    Cataract    "mild"   COPD (chronic obstructive pulmonary disease) (HCC)    Depression    GERD (gastroesophageal reflux disease)    o cc- takes OTC if needed.   Glaucoma    Hepatitis C    Hypertension    onset age 89.   Iron deficiency anemia    Migraine    Schizoaffective disorder (HCC)    Sciatic pain    Seizures (HCC)    onset in childhood. 06-03-16- per pt 8 months agoGeneralized tonic-clonic.Last seizure couple of months ago- last sz 9-10 months ago per pt 10-12-2016   Shortness of breath dyspnea    Substance abuse ([place])    Ulcer      Past Surgical History:  Procedure Laterality Date   ABDOMINAL HYSTERECTOMY     ovarian cyst B, cervical dysplasia, fibroids.   Ovaries intact.   BIOPSY  07/11/2018   Procedure: BIOPSY;  Surgeon: Yetta Flock, MD;  Location: [facility];  Service: Gastroenterology;;   COLONOSCOPY     COLONOSCOPY W/ BIOPSIES     DILATION AND CURETTAGE OF UTERUS     x2   ESOPHAGOGASTRODUODENOSCOPY (EGD) WITH PROPOFOL N/A 07/11/2018   Procedure: ESOPHAGOGASTRODUODENOSCOPY (EGD) WITH PROPOFOL;  Surgeon: Yetta Flock, MD;  Location: [facility];  Service: Gastroenterology;  Laterality: N/A;   EXPLORATORY  LAPAROTOMY     x3   GYNECOLOGIC CRYOSURGERY     x3   HOT HEMOSTASIS N/A 07/11/2018   Procedure: HOT HEMOSTASIS (ARGON PLASMA COAGULATION/BICAP);  Surgeon: Yetta Flock, MD;  Location: [facility];  Service: Gastroenterology;  Laterality: N/A;   MANDIBLE RECONSTRUCTION N/A 06/27/2015   Procedure: REMOVAL MAXILLARY PALATAL TORUS.  REMOVAL MANDIBULAR TORUS AND EXOSTOSIS.  ;  Surgeon: Diona Browner, DDS;  Location: [facility];  Service: Oral Surgery;  Laterality: N/A;   OVARIAN CYST REMOVAL   2008   POLYPECTOMY       Outpatient Medications Prior to Visit  Medication Sig Dispense Refill   albuterol (PROAIR HFA) 108 (90 Base) MCG/ACT inhaler INHALE 2 PUFFS BY MOUTH EVERY 6 HOURS AS NEEDED FOR SHORTNESS OF BREATH 8.5 g 1   budesonide-formoterol (SYMBICORT) 160-4.5 MCG/ACT inhaler Inhale 2 puffs into the lungs 2 (two) times daily. 2 Inhaler 3   citalopram (CELEXA) 20 MG tablet Take 1 tablet (20 mg total) by mouth daily. 90 tablet 0   diclofenac sodium (VOLTAREN) 1 % GEL Apply 2 g topically 4 (four) times daily. 1 Tube 0   diphenhydrAMINE-APAP, sleep, (TYLENOL PM EXTRA STRENGTH PO) Take 500 mg by mouth once.      ferrous sulfate 325 (65 FE) MG tablet Take 1 tablet (325 mg total) by mouth 2 (two) times daily with a meal. 99991111 tablet 1   folic acid (FOLVITE) 1 MG tablet Take 1 tablet (1 mg total) by mouth daily. 30 tablet 2   gabapentin (NEURONTIN) 400 MG capsule Take 1 capsule (400 mg total) by mouth 3 (three) times daily. 270 capsule 1   ipratropium-albuterol (DUONEB) 0.5-2.5 (3) MG/3ML SOLN Take 3 mLs by nebulization every 6 (six) hours as needed (shortness of breath). Reported on 12/02/2015 360 mL 1   lamoTRIgine (LAMICTAL) 25 MG tablet Take 2 tablets (50 mg total) by mouth daily. 60 tablet 0   methocarbamol (ROBAXIN) 750 MG tablet Take 1 tablet (750 mg total) by mouth 3 (three) times daily. 90 tablet 1   Multiple Vitamin (MULTIVITAMIN WITH MINERALS) TABS tablet Take 1 tablet by mouth daily. 30 tablet 1   ondansetron (ZOFRAN) 4 MG tablet TAKE 1 TABLET BY MOUTH EVERY 6 HOURS AS NEEDED FOR NAUSEA 20 tablet 1   pantoprazole (PROTONIX) 40 MG tablet TAKE 1 TABLET BY MOUTH TWICE DAILY BEFORE A MEAL 60 tablet 0   QUEtiapine (SEROQUEL) 50 MG tablet Take 3 tablets (150 mg total) by mouth at bedtime. 90 tablet 3   tiZANidine (ZANAFLEX) 4 MG tablet Take one tablet daily 30 tablet 3   triamcinolone cream (KENALOG) 0.1 % APPLY CREAM EXTERNALLY TWICE DAILY 30 g 0   acetaminophen  (TYLENOL) 500 MG tablet Take 500 mg by mouth once.     metroNIDAZOLE (FLAGYL) 500 MG tablet Take 1 tablet (500 mg total) by mouth 2 (two) times daily. 14 tablet 0   nicotine (NICODERM CQ - DOSED IN MG/24 HOURS) 14 mg/24hr patch Place 1 patch (14 mg total) onto the skin daily. (Patient not taking: Reported on 09/05/2019) 28 patch 0   polyethylene glycol (MIRALAX) packet Take 17 g by mouth daily. (Patient not taking: Reported on 09/05/2019) 30 each 3   senna-docusate (SENOKOT-S) 8.6-50 MG tablet Take 2 tablets by mouth at bedtime. (Patient not taking: Reported on 08/29/2019) 60 tablet 2   thiamine 100 MG tablet Take 1 tablet (100 mg total) by mouth daily. (Patient not taking: Reported on 09/05/2019) 90 tablet 1   valACYclovir (  VALTREX) 1000 MG tablet Take 2 tabs po at symptom onset, repeat in 12 hours (Patient not taking: Reported on 09/05/2019) 20 tablet 3   No facility-administered medications prior to visit.     Allergies  Allergen Reactions   Fruit & Vegetable Daily [Nutritional Supplements] Shortness Of Breath    Aloe   Iodinated Diagnostic Agents Swelling    Swelling and itching of left side of her face only after CT SI injection(no steroid used)   Aloe Vera Rash     Family History  Problem Relation Age of Onset   Diabetes type II Mother    Hypertension Mother    Arthritis Mother    Diabetes Mother    Mental illness Mother        bipolar   Diabetes type II Maternal Aunt    Hypertension Father    Heart murmur Father    Arthritis Father    Stroke Father    Alopecia Sister    Alopecia Brother    Alopecia Sister    Mental retardation Sister        depression   Prostate cancer Paternal Grandfather    Cancer Maternal Uncle    Colon cancer Neg Hx    Esophageal cancer Neg Hx    Rectal cancer Neg Hx    Stomach cancer Neg Hx    Colon polyps Neg Hx      Social History   Socioeconomic History   Marital status: Widowed    Spouse name: Not on file     Number of children: 1   Years of education: Not on file   Highest education level: Not on file  Occupational History   Occupation: disabled    Comment: mental illness; seizures  Social Designer, fashion/clothing strain: Not on file   Food insecurity    Worry: Not on file    Inability: Not on file   Transportation needs    Medical: Not on file    Non-medical: Not on file  Tobacco Use   Smoking status: Current Every Day Smoker    Packs/day: 0.33    Years: 44.00    Pack years: 14.52    Types: Cigarettes   Smokeless tobacco: Never Used  Substance and Sexual Activity   Alcohol use: Yes    Alcohol/week: 0.0 standard drinks    Comment: occasionally   Drug use: Yes    Types: Marijuana, Cocaine   Sexual activity: Not Currently    Birth control/protection: None    Comment: widow  Lifestyle   Physical activity    Days per week: Not on file    Minutes per session: Not on file   Stress: Not on file  Relationships   Social connections    Talks on phone: Not on file    Gets together: Not on file    Attends religious service: Not on file    Active member of club or organization: Not on file    Attends meetings of clubs or organizations: Not on file    Relationship status: Not on file  Other Topics Concern   Not on file  Social History Narrative   Marital status:  Widowed since 2002.  Married x 16 years. + dating.  Moved from [place] to live with daughter in 2013.     Children:  One child/daughter (33); two grandchildren.      Lives: with daughter, grandchildren 2.  Does not drive due to epilepsy.      Employment:  Disability for schizoaffective disorder.      Tobacco: 1 ppd x since 8th grade.      Alcohol:  Social; rare drinking due to seizure medications.  Weekends.       Drugs: none; previous use of marijuana.  Previous iv drug use, cocaine.      Exercise: none      Seatbelt:  100%      Guns: none      Recent Hospitalizations? No  Health  Habits: Current exercise activities include: none Exercise: 0 times/week. Diet: in general, a "healthy" diet    Alcohol intake: 1-2 beers a day  Health Risk Assessment: The patient has completed a Health Risk Assessment. This has been reveiwed with them and has been scanned into the [place] system as an attached document.  Current Medical Providers and Suppliers: [facility] Patient Care Team: Forrest Moron, MD as PCP - General (Internal Medicine) Future Appointments  Date Time Provider [place]  09/28/2019 10:00 AM Forrest Moron, MD PCP-PCP [facility]  03/04/2020  9:00 AM Forrest Moron, MD PCP-PCP [facility]     Age-appropriate Screening Schedule: The list below includes current immunization status and future screening recommendations based on patient's age. Orders for these recommended tests are listed in the plan section. The patient has been provided with a written plan. Immunization History  Administered Date(s) Administered   Hepatitis A, Adult 05/11/2016, 04/06/2017   Influenza,inj,Quad PF,6+ Mos 09/29/2015, 09/04/2016, 09/02/2017   Pneumococcal Polysaccharide-23 11/12/2011   Tdap 05/11/2016    Health Maintenance reviewed -  mammogram ordered, patient to schedule appointment   Depression Screen-PHQ2/9 completed today  Depression screen [facility] 2/9 09/05/2019 08/29/2019 06/20/2019 03/28/2019 08/28/2018  Decreased Interest 2 0 0 0 2  Down, Depressed, Hopeless 2 2 0 0 2  PHQ - 2 Score 4 2 0 0 4  Altered sleeping 1 2 - - 2  Tired, decreased energy 1 1 - - 3  Change in appetite 3 3 - - 0  Feeling bad or failure about yourself  1 2 - - 2  Trouble concentrating 2 1 - - 2  Moving slowly or fidgety/restless 2 1 - - 2  Suicidal thoughts 0 0 - - 0  PHQ-9 Score 14 12 - - 15  Difficult doing work/chores Somewhat difficult Very difficult - - Very difficult  Some recent data might be hidden       Depression Severity and Treatment Recommendations:  0-4= None  5-9= Mild / Treatment:  Support, educate to call if worse; return in one month  10-14= Moderate / Treatment: Support, watchful waiting; Antidepressant or Psycotherapy  15-19= Moderately severe / Treatment: Antidepressant OR Psychotherapy  >= 20 = Major depression, severe / Antidepressant AND Psychotherapy  Functional Status Survey:   Is the patient deaf or have difficulty hearing?: Yes Does the patient have difficulty seeing, even when wearing glasses/contacts?: Yes Does the patient have difficulty concentrating, remembering, or making decisions?: Yes Does the patient have difficulty walking or climbing stairs?: Yes Does the patient have difficulty dressing or bathing?: Yes Does the patient have difficulty doing errands alone such as visiting a doctor's office or shopping?: Yes    Advanced Care Planning: 1. Patient has executed an Advance Directive: No 2. If no, patient was given the opportunity to execute an Advance Directive today? No 3. Are the patient's advanced directives in [place]? No 4. This patient has the ability to prepare an Advance Directive: Yes 5. Provider is willing to follow the patient's wishes:  Yes  Cognitive Assessment: Does the patient have evidence of cognitive impairment? No The patient does not have any evidence of any cognitive problems and denies any  change in mood/affect, appearance, speech, memory or motor skills.  Identification of Risk Factors: Risk factors include: hypertension and smoking  ROS Review of Systems  Constitutional: Negative for activity change, appetite change, chills and fever.  HENT: Negative for congestion, nosebleeds, trouble swallowing and voice change.   Respiratory: Negative for cough, shortness of breath and wheezing.   Gastrointestinal: Negative for diarrhea, nausea and vomiting.  Genitourinary: Negative for difficulty urinating, dysuria, flank pain and hematuria.  Musculoskeletal: Negative for back pain, joint swelling and neck pain.  Neurological:  Negative for dizziness, speech difficulty, light-headedness and numbness.  See HPI. All other review of systems negative.   Objective:   Vitals:   09/05/19 0905  BP: (!) 150/86  Pulse: 86  Temp: 97.7 F (36.5 C)  TempSrc: Oral  SpO2: 96%  Weight: 115 lb 3.2 oz (52.3 kg)  Height: 5\' 3"  (1.6 m)    Body mass index is 20.41 kg/m.   Physical Exam Exam conducted with a chaperone present.  Constitutional:      Comments: Thin female  HENT:     Head: Normocephalic and atraumatic.     Right Ear: Tympanic membrane normal.     Left Ear: Tympanic membrane normal.     Nose: Nose normal.     Mouth/Throat:     Mouth: Mucous membranes are moist.     Pharynx: No oropharyngeal exudate or posterior oropharyngeal erythema.  Eyes:     Extraocular Movements: Extraocular movements intact.     Conjunctiva/sclera: Conjunctivae normal.  Neck:     Musculoskeletal: Normal range of motion and neck supple.  Cardiovascular:     Rate and Rhythm: Normal rate and regular rhythm.     Pulses: Normal pulses.     Heart sounds: No murmur.  Pulmonary:     Effort: Pulmonary effort is normal. No respiratory distress.     Breath sounds: Normal breath sounds. No stridor. No wheezing, rhonchi or rales.  Chest:     Chest wall: No tenderness.  Abdominal:     General: Abdomen is flat. Bowel sounds are normal. There is no distension.     Palpations: Abdomen is soft. There is no mass.     Tenderness: There is no abdominal tenderness. There is no right CVA tenderness, left CVA tenderness, guarding or rebound.     Hernia: No hernia is present.  Skin:    General: Skin is warm.     Capillary Refill: Capillary refill takes less than 2 seconds.     Findings: No erythema.  Neurological:     Mental Status: She is alert.  Psychiatric:        Mood and Affect: Mood normal.        Behavior: Behavior normal.        Thought Content: Thought content normal.        Judgment: Judgment normal.    Vaginal exam- Chaperone  Present Labia normal bilaterally without skin lesions Urethral meatus normal appearing without erythema Vagina with scant discharge No uterus palpable, surgically absent     Assessment/Plan:   Patient Self-Management and Personalized Health Advice The patient has been provided with information about:  quit smoking  During the course of the visit the patient was educated and counseled about appropriate screening and preventive services including:   return annually or prn  Body mass index is 20.41 kg/m. Discussed the patient's BMI with her. The BMI BMI is in the acceptable range  Mikhala was seen today for annual exam.  Diagnoses and all orders for this visit:  Medicare Wellness Exam - Women's Health Maintenance Plan Advised monthly breast exam and annual mammogram Advised dental exam every six months Discussed stress management Discussed pap smear screening guidelines   Estrogen deficiency -     DG Bone Density; Future  Screen for STD (sexually transmitted disease) -     HIV Antibody (routine testing w rflx) -     Hepatitis B surface antigen -     POCT Wet + KOH Prep -     RPR  Screening for cervical cancer - Discussed cervical cancer screening  As well as screening for HPV Discussed risk factors for cervical cancer  -     Cytology - PAP  Smoker- advised smoking cessation  Essential hypertension- bp improved from hypotensive, now noted to be hypertensive, will resume amlodipine but at half dose -     Lipid panel -     amLODipine (NORVASC) 5 MG tablet; Take 1 tablet (5 mg total) by mouth daily.  Vitamin D deficiency -     DG Bone Density; Future  Screening for breast cancer  Low weight- risk factor for bone density  -     TSH -     DG Bone Density; Future  Encounter for medication monitoring- will assess  -     Lamotrigine level  Schizoaffective disorder, bipolar type ([place]) Persistent depressive disorder- continue with Psychiatry -      TSH   Seizure disorder ([place])- seizures stable -     Lamotrigine level      Return in about 6 months (around 03/04/2020) for blood pressure.  Future Appointments  Date Time Provider [place]  09/28/2019 10:00 AM Forrest Moron, MD PCP-PCP [facility]  03/04/2020  9:00 AM Forrest Moron, MD PCP-PCP [facility]    Patient Instructions   Please follow up with Psychiatry for your depression.   Depression screen [facility] 2/9 09/05/2019 08/29/2019 06/20/2019 03/28/2019 08/28/2018  Decreased Interest 2 0 0 0 2  Down, Depressed, Hopeless 2 2 0 0 2  PHQ - 2 Score 4 2 0 0 4  Altered sleeping 1 2 - - 2  Tired, decreased energy 1 1 - - 3  Change in appetite 3 3 - - 0  Feeling bad or failure about yourself  1 2 - - 2  Trouble concentrating 2 1 - - 2  Moving slowly or fidgety/restless 2 1 - - 2  Suicidal thoughts 0 0 - - 0  PHQ-9 Score 14 12 - - 15  Difficult doing work/chores Somewhat difficult Very difficult - - Very difficult  Some recent data might be hidden      We recommend that you schedule a mammogram for breast cancer screening. Typically, you do not need a referral to do this. Please contact a local imaging center to schedule your mammogram.  [facility] - [phone]  *ask for the Radiology Department [facility]) - [phone] or [phone]  [facility] - [phone] [place] [phone] [facility] - [phone]  *ask for the [facility] - [phone]  *ask for the Radiology Department [facility] - [phone]  *ask for the [facility] - [phone]  If you have lab work done today you will be contacted with your lab results within the next 2 weeks.  If you have not heard from [place] then please contact us. The fastest way to get your results is to register for My Chart.   IF you received an x-ray today, you  will receive an invoice from [facility] Radiology. Please contact [facility] Radiology at [phone] with questions or concerns regarding your invoice.   IF you received labwork today, you will receive an invoice from [place]. Please contact [facility] at [phone] with questions or concerns regarding your invoice.   Our billing staff will not be able to assist you with questions regarding bills from these companies.  You will be contacted with the lab results as soon as they are available. The fastest way to get your results is to activate your My Chart account. Instructions are located on the last page of this paperwork. If you have not heard from [place] regarding the results in 2 weeks, please contact this office.      Bone Health Bones protect organs, store calcium, anchor muscles, and support the whole body. Keeping your bones strong is important, especially as you get older. You can take actions to help keep your bones strong and healthy. Why is keeping my bones healthy important?  Keeping your bones healthy is important because your body constantly replaces bone cells. Cells get old, and new cells take their place. As we age, we lose bone cells because the body may not be able to make enough new cells to replace the old cells. The amount of bone cells and bone tissue you have is referred to as bone mass. The higher your bone mass, the stronger your bones. The aging process leads to an overall loss of bone mass in the body, which can increase the likelihood of:  Joint pain and stiffness.  Broken bones.  A condition in which the bones become weak and brittle (osteoporosis). A large decline in bone mass occurs in older adults. In women, it occurs about the time of menopause. What actions can I take to keep my bones healthy? Good health habits are important for maintaining healthy bones. This includes eating nutritious foods and exercising regularly. To have healthy bones, you need to get  enough of the right minerals and vitamins. Most nutrition experts recommend getting these nutrients from the foods that you eat. In some cases, taking supplements may also be recommended. Doing certain types of exercise is also important for bone health. What are the nutritional recommendations for healthy bones?  Eating a well-balanced diet with plenty of calcium and vitamin D will help to protect your bones. Nutritional recommendations vary from person to person. Ask your health care provider what is healthy for you. Here are some general guidelines. Get enough calcium Calcium is the most important (essential) mineral for bone health. Most people can get enough calcium from their diet, but supplements may be recommended for people who are at risk for osteoporosis. Good sources of calcium include:  Dairy products, such as low-fat or nonfat milk, cheese, and yogurt.  Dark green leafy vegetables, such as bok choy and broccoli.  Calcium-fortified foods, such as orange juice, cereal, bread, soy beverages, and tofu products.  Nuts, such as almonds. Follow these recommended amounts for daily calcium intake:  Children, age 37-3: 700 mg.  Children, age 42-8: 1,000 mg.  Children, age 81-13: 1,300 mg.  Teens, age 63-18: 1,300 mg.  Adults, age 25-50: 1,000 mg.  Adults, age 985-70: ? Men: 1,000 mg. ? Women: 1,200 mg.  Adults, age 71 or older: 1,200 mg.  Pregnant and breastfeeding females: ? Teens: 1,300 mg. ? Adults: 1,000 mg. Get enough vitamin D Vitamin D is the most essential vitamin for bone health. It helps the body absorb calcium. Sunlight stimulates the skin to make vitamin D, so be sure to get enough sunlight. If you live in a cold climate or you do not get outside often, your health care provider may recommend that you take vitamin D supplements. Good sources of vitamin D in your diet include:  Egg yolks.  Saltwater fish.  Milk and cereal fortified with vitamin D. Follow these  recommended amounts for daily vitamin D intake:  Children and teens, age 98-18: 600 international units.  Adults, age 63 or younger: 400-800 international units.  Adults, age 82 or older: 800-1,000 international units. Get other important nutrients Other nutrients that are important for bone health include:  Phosphorus. This mineral is found in meat, poultry, dairy foods, nuts, and legumes. The recommended daily intake for adult men and adult women is 700 mg.  Magnesium. This mineral is found in seeds, nuts, dark green vegetables, and legumes. The recommended daily intake for adult men is 400-420 mg. For adult women, it is 310-320 mg.  Vitamin K. This vitamin is found in green leafy vegetables. The recommended daily intake is 120 mg for adult men and 90 mg for adult women. What type of physical activity is best for building and maintaining healthy bones? Weight-bearing and strength-building activities are important for building and maintaining healthy bones. Weight-bearing activities cause muscles and bones to work against gravity. Strength-building activities increase the strength of the muscles that support bones. Weight-bearing and muscle-building activities include:  Walking and hiking.  Jogging and running.  Dancing.  Gym exercises.  Lifting weights.  Tennis and racquetball.  Climbing stairs.  Aerobics. Adults should get at least 30 minutes of moderate physical activity on most days. Children should get at least 60 minutes of moderate physical activity on most days. Ask your health care provider what type of exercise is best for you. How can I find out if my bone mass is low? Bone mass can be measured with an X-ray test called a bone mineral density (BMD) test. This test is recommended for all women who are age 35 or older. It may also be recommended for:  Men who are age 53 or older.  People who are at risk for osteoporosis because of: ? Having bones that break  easily. ? Having a long-term disease that weakens bones, such as kidney disease or rheumatoid arthritis. ? Having menopause earlier than normal. ? Taking medicine that weakens bones, such as steroids, thyroid hormones, or hormone treatment for breast cancer or prostate cancer. ? Smoking. ? Drinking three or more alcoholic drinks a day. If you find that you have a low bone mass, you may be able to prevent osteoporosis or further bone loss by changing your diet and lifestyle. Where can I find more information? For more information, check out the following websites:  [place]: AviationTales.fr  [employer]: www.bones.SouthExposed.es  International Osteoporosis Foundation: Administrator.iofbonehealth.org Summary  The aging process leads to an overall loss of bone mass in the body, which can increase the likelihood of broken bones and osteoporosis.  Eating a well-balanced diet with plenty of calcium and vitamin D will help to protect your bones.  Weight-bearing and strength-building activities are also important  for building and maintaining strong bones.  Bone mass can be measured with an X-ray test called a bone mineral density (BMD) test. This information is not intended to replace advice given to you by your health care provider. Make sure you discuss any questions you have with your health care provider. Document Released: 02/19/2004 Document Revised: 12/26/2017 Document Reviewed: 12/26/2017 Elsevier Patient Education  [place].    An after visit summary with all of these plans was given to the patient.

## 2019-09-06 LAB — LIPID PANEL
Chol/HDL Ratio: 2.2 ratio (ref 0.0–4.4)
Cholesterol, Total: 203 mg/dL — ABNORMAL HIGH (ref 100–199)
HDL: 91 mg/dL (ref >39)
LDL Chol Calc (NIH): 98 mg/dL (ref 0–99)
Triglycerides: 79 mg/dL (ref 0–149)
VLDL Cholesterol Cal: 14 mg/dL (ref 5–40)

## 2019-09-06 LAB — RPR: RPR Ser Ql: NONREACTIVE

## 2019-09-06 LAB — CYTOLOGY - PAP
Diagnosis: NEGATIVE
High risk HPV: NEGATIVE
Molecular Disclaimer: 56
Molecular Disclaimer: DETECTED
Molecular Disclaimer: NORMAL

## 2019-09-06 LAB — TSH: TSH: 0.869 u[IU]/mL (ref 0.450–4.500)

## 2019-09-06 LAB — CERVICOVAGINAL ANCILLARY ONLY: HPV: NOT DETECTED

## 2019-09-06 LAB — LAMOTRIGINE LEVEL: Lamotrigine Lvl: <1 ug/mL — ABNORMAL LOW (ref 2.0–20.0)

## 2019-09-06 LAB — HEPATITIS B SURFACE ANTIGEN: Hepatitis B Surface Ag: NEGATIVE

## 2019-09-06 LAB — HIV ANTIBODY (ROUTINE TESTING W REFLEX): HIV Screen 4th Generation wRfx: NONREACTIVE

## 2019-09-06 MED ORDER — METRONIDAZOLE 500 MG PO TABS: 500.0000 mg | ORAL_TABLET | Freq: Two times a day (BID) | ORAL | 0 refills | Status: DC

## 2019-09-06 MED ORDER — FLUCONAZOLE 150 MG PO TABS: 150.0000 mg | ORAL_TABLET | Freq: Once | ORAL | 0 refills | Status: AC

## 2019-09-11 ENCOUNTER — Encounter: Payer: Self-pay | Admitting: Radiology

## 2019-09-28 ENCOUNTER — Ambulatory Visit (INDEPENDENT_AMBULATORY_CARE_PROVIDER_SITE_OTHER): Payer: 59 | Admitting: Family Medicine

## 2019-09-28 ENCOUNTER — Other Ambulatory Visit: Payer: Self-pay

## 2019-09-28 ENCOUNTER — Encounter: Payer: Self-pay | Admitting: Family Medicine

## 2019-09-28 VITALS — BP 121/70 | HR 86 | Temp 98.6°F | Ht 63.0 in | Wt 114.0 lb

## 2019-09-28 DIAGNOSIS — R200 Anesthesia of skin: Secondary | ICD-10-CM

## 2019-09-28 DIAGNOSIS — F17200 Nicotine dependence, unspecified, uncomplicated: Secondary | ICD-10-CM

## 2019-09-28 DIAGNOSIS — R310 Gross hematuria: Secondary | ICD-10-CM | POA: Diagnosis not present

## 2019-09-28 DIAGNOSIS — J431 Panlobular emphysema: Secondary | ICD-10-CM

## 2019-09-28 DIAGNOSIS — E2839 Other primary ovarian failure: Secondary | ICD-10-CM

## 2019-09-28 DIAGNOSIS — Z760 Encounter for issue of repeat prescription: Secondary | ICD-10-CM

## 2019-09-28 DIAGNOSIS — N3946 Mixed incontinence: Secondary | ICD-10-CM

## 2019-09-28 DIAGNOSIS — R202 Paresthesia of skin: Secondary | ICD-10-CM

## 2019-09-28 DIAGNOSIS — F1921 Other psychoactive substance dependence, in remission: Secondary | ICD-10-CM

## 2019-09-28 MED ORDER — NEBULIZER AIR TUBE/PLUGS MISC: 0 refills | Status: DC

## 2019-09-28 NOTE — Progress Notes (Signed)
Established Patient Office Visit  Subjective:  Patient ID: Melissa Bridges, female    DOB: 20-Dec-1960  Age: 58 y.o. MRN: [id number]  CC:  Chief Complaint  Patient presents with  . Follow-up    blood in urine long term-- finger/arm--numbness/cold sensation--over 1 year    HPI Melissa Bridges presents for   Gross Hematuria Urinary Incontinence  Patient reports that she sees blood in her urine from time to time She is interested in vaginal estrogen She has pain with intercourse.  She also reports that she has been having numbness/cold sensation in her arms and fingers States that she does not know what it is   Smoker/emphysema Pt states that she is having coughing  She still smokes and is wondering what she can take to stop coughing She denies fevers or chills   Past Medical History:  Diagnosis Date  . Allergy   . Alopecia   . Anxiety   . Arthritis   . Asthma   . Cataract    "mild"  . COPD (chronic obstructive pulmonary disease) ([place])   . Depression   . GERD (gastroesophageal reflux disease)    o cc- takes OTC if needed.  . Glaucoma   . Hepatitis C   . Hypertension    onset age 58.  . Iron deficiency anemia   . Migraine   . Schizoaffective disorder ([place])   . Sciatic pain   . Seizures ([place])    onset in childhood. 06-03-16- per pt 8 months agoGeneralized tonic-clonic.Last seizure couple of months ago- last sz 9-10 months ago per pt 10-12-2016  . Shortness of breath dyspnea   . Substance abuse ([place])   . Ulcer     Past Surgical History:  Procedure Laterality Date  . ABDOMINAL HYSTERECTOMY     ovarian cyst B, cervical dysplasia, fibroids.   Ovaries intact.  [name] BIOPSY  07/11/2018   Procedure: BIOPSY;  Surgeon: Yetta Flock, MD;  Location: [facility];  Service: Gastroenterology;;  . COLONOSCOPY    . COLONOSCOPY W/ BIOPSIES    . DILATION AND CURETTAGE OF UTERUS     x2  . ESOPHAGOGASTRODUODENOSCOPY (EGD) WITH PROPOFOL N/A 07/11/2018   Procedure:  ESOPHAGOGASTRODUODENOSCOPY (EGD) WITH PROPOFOL;  Surgeon: Yetta Flock, MD;  Location: [facility];  Service: Gastroenterology;  Laterality: N/A;  . EXPLORATORY LAPAROTOMY     x3  . GYNECOLOGIC CRYOSURGERY     x3  . HOT HEMOSTASIS N/A 07/11/2018   Procedure: HOT HEMOSTASIS (ARGON PLASMA COAGULATION/BICAP);  Surgeon: Yetta Flock, MD;  Location: [facility];  Service: Gastroenterology;  Laterality: N/A;  . MANDIBLE RECONSTRUCTION N/A 06/27/2015   Procedure: REMOVAL MAXILLARY PALATAL TORUS.  REMOVAL MANDIBULAR TORUS AND EXOSTOSIS.  ;  Surgeon: Diona Browner, DDS;  Location: [facility];  Service: Oral Surgery;  Laterality: N/A;  . OVARIAN CYST REMOVAL  2008  . POLYPECTOMY      Family History  Problem Relation Age of Onset  . Diabetes type II Mother   . Hypertension Mother   . Arthritis Mother   . Diabetes Mother   . Mental illness Mother        bipolar  . Diabetes type II Maternal Aunt   . Hypertension Father   . Heart murmur Father   . Arthritis Father   . Stroke Father   . Alopecia Sister   . Alopecia Brother   . Alopecia Sister   . Mental retardation Sister        depression  . Prostate cancer  Paternal Grandfather   . Cancer Maternal Uncle   . Colon cancer Neg Hx   . Esophageal cancer Neg Hx   . Rectal cancer Neg Hx   . Stomach cancer Neg Hx   . Colon polyps Neg Hx     Social History   Socioeconomic History  . Marital status: Widowed    Spouse name: Not on file  . Number of children: 1  . Years of education: Not on file  . Highest education level: Not on file  Occupational History  . Occupation: disabled    Comment: mental illness; seizures  Social Needs  . Financial resource strain: Not on file  . Food insecurity    Worry: Not on file    Inability: Not on file  . Transportation needs    Medical: Not on file    Non-medical: Not on file  Tobacco Use  . Smoking status: Current Every Day Smoker    Packs/day: 0.33    Years: 44.00    Pack years:  14.52    Types: Cigarettes  . Smokeless tobacco: Never Used  Substance and Sexual Activity  . Alcohol use: Yes    Alcohol/week: 0.0 standard drinks    Comment: 1 pakj every 3 days  . Drug use: Yes    Types: Marijuana, Cocaine  . Sexual activity: Not Currently    Birth control/protection: None    Comment: widow  Lifestyle  . Physical activity    Days per week: Not on file    Minutes per session: Not on file  . Stress: Not on file  Relationships  . Social Herbalist on phone: Not on file    Gets together: Not on file    Attends religious service: Not on file    Active member of club or organization: Not on file    Attends meetings of clubs or organizations: Not on file    Relationship status: Not on file  . Intimate partner violence    Fear of current or ex partner: Not on file    Emotionally abused: Not on file    Physically abused: Not on file    Forced sexual activity: Not on file  Other Topics Concern  . Not on file  Social History Narrative   Marital status:  Widowed since 2002.  Married x 16 years. + dating.  Moved from [place] to live with daughter in 2013.     Children:  One child/daughter (33); two grandchildren.      Lives: with daughter, grandchildren 2.  Does not drive due to epilepsy.      Employment:  Disability for schizoaffective disorder.      Tobacco: 1 ppd x since 8th grade.      Alcohol:  Social; rare drinking due to seizure medications.  Weekends.       Drugs: none; previous use of marijuana.  Previous iv drug use, cocaine.      Exercise: none      Seatbelt:  100%      Guns: none    Outpatient Medications Prior to Visit  Medication Sig Dispense Refill  . amLODipine (NORVASC) 5 MG tablet Take 1 tablet (5 mg total) by mouth daily. 90 tablet 3  . budesonide-formoterol (SYMBICORT) 160-4.5 MCG/ACT inhaler Inhale 2 puffs into the lungs 2 (two) times daily. (Patient not taking: Reported on 11/08/2019) 2 Inhaler 3  . citalopram (CELEXA) 20 MG  tablet Take 1 tablet (20 mg total) by mouth daily. 90 tablet  0  . gabapentin (NEURONTIN) 400 MG capsule Take 1 capsule (400 mg total) by mouth 3 (three) times daily. 270 capsule 1  . ipratropium-albuterol (DUONEB) 0.5-2.5 (3) MG/3ML SOLN Take 3 mLs by nebulization every 6 (six) hours as needed (shortness of breath). Reported on 12/02/2015 360 mL 1  . Multiple Vitamin (MULTIVITAMIN WITH MINERALS) TABS tablet Take 1 tablet by mouth daily. 30 tablet 1  . ondansetron (ZOFRAN) 4 MG tablet TAKE 1 TABLET BY MOUTH EVERY 6 HOURS AS NEEDED FOR NAUSEA (Patient taking differently: Take 4 mg by mouth every 6 (six) hours as needed. ) 20 tablet 1  . QUEtiapine (SEROQUEL) 50 MG tablet Take 3 tablets (150 mg total) by mouth at bedtime. (Patient not taking: Reported on 11/08/2019) 90 tablet 3  . thiamine 100 MG tablet Take 1 tablet (100 mg total) by mouth daily. (Patient not taking: Reported on 10/29/2019) 90 tablet 1  . tiZANidine (ZANAFLEX) 4 MG tablet Take one tablet daily (Patient taking differently: Take 4 mg by mouth 3 (three) times daily as needed for muscle spasms. ) 30 tablet 3  . albuterol (PROAIR HFA) 108 (90 Base) MCG/ACT inhaler INHALE 2 PUFFS BY MOUTH EVERY 6 HOURS AS NEEDED FOR SHORTNESS OF BREATH (Patient taking differently: Inhale 2 puffs into the lungs every 6 (six) hours as needed for shortness of breath. ) 8.5 g 1  . diclofenac sodium (VOLTAREN) 1 % GEL Apply 2 g topically 4 (four) times daily. (Patient not taking: Reported on 10/11/2019) 1 Tube 0  . diphenhydrAMINE-APAP, sleep, (TYLENOL PM EXTRA STRENGTH PO) Take 500 mg by mouth once.     . ferrous sulfate 325 (65 FE) MG tablet Take 1 tablet (325 mg total) by mouth 2 (two) times daily with a meal. (Patient not taking: Reported on 10/11/2019) 99991111 tablet 1  . folic acid (FOLVITE) 1 MG tablet Take 1 tablet (1 mg total) by mouth daily. (Patient not taking: Reported on 10/11/2019) 30 tablet 2  . lamoTRIgine (LAMICTAL) 25 MG tablet Take 2 tablets (50 mg  total) by mouth daily. 60 tablet 0  . metroNIDAZOLE (FLAGYL) 500 MG tablet Take 1 tablet (500 mg total) by mouth 2 (two) times daily. (Patient not taking: Reported on 10/11/2019) 14 tablet 0  . pantoprazole (PROTONIX) 40 MG tablet TAKE 1 TABLET BY MOUTH TWICE DAILY BEFORE A MEAL (Patient not taking: Reported on 10/11/2019) 60 tablet 0  . polyethylene glycol (MIRALAX) packet Take 17 g by mouth daily. (Patient not taking: Reported on 10/11/2019) 30 each 3  . senna-docusate (SENOKOT-S) 8.6-50 MG tablet Take 2 tablets by mouth at bedtime. (Patient not taking: Reported on 10/11/2019) 60 tablet 2  . triamcinolone cream (KENALOG) 0.1 % APPLY CREAM EXTERNALLY TWICE DAILY 30 g 0  . valACYclovir (VALTREX) 1000 MG tablet Take 2 tabs po at symptom onset, repeat in 12 hours 20 tablet 3  . methocarbamol (ROBAXIN) 750 MG tablet Take 1 tablet (750 mg total) by mouth 3 (three) times daily. (Patient not taking: Reported on 09/28/2019) 90 tablet 1  . nicotine (NICODERM CQ - DOSED IN MG/24 HOURS) 14 mg/24hr patch Place 1 patch (14 mg total) onto the skin daily. (Patient not taking: Reported on 09/05/2019) 28 patch 0   No facility-administered medications prior to visit.     Allergies  Allergen Reactions  . Fruit & Vegetable Daily [Nutritional Supplements] Shortness Of Breath    Reaction to Aloe  . Iodinated Diagnostic Agents Swelling    Swelling and itching of left side of  her face only after CT SI injection(no steroid used)  . Ibuprofen Other (See Comments)    Causes Stomach upset; However, pt can take Meloxicam without incident and takes this at home.   . Aloe Vera Rash  . Aspirin Rash and Other (See Comments)    Stomach upset    ROS Review of Systems Review of Systems  Constitutional: Negative for activity change, appetite change, chills and fever.  HENT: Negative for congestion, nosebleeds, trouble swallowing and voice change.   Respiratory: see hpi Gastrointestinal: Negative for diarrhea, nausea and  vomiting.  Genitourinary: see hpi Musculoskeletal: Negative for back pain, joint swelling and neck pain.  Neurological: see hpi See HPI. All other review of systems negative.     Objective:    Physical Exam  BP 121/70 (BP Location: Right Arm, Patient Position: Sitting, Cuff Size: Normal)   Pulse 86   Temp 98.6 F (37 C)   Ht 5\' 3"  (1.6 m)   Wt 114 lb (51.7 kg)   SpO2 99%   BMI 20.19 kg/m  Wt Readings from Last 3 Encounters:  11/08/19 117 lb (53.1 kg)  10/29/19 117 lb (53.1 kg)  10/29/19 117 lb (53.1 kg)   Physical Exam  Constitutional: Oriented to person, place, and time. Appears thin HENT:  Head: Normocephalic and atraumatic.  Eyes: Conjunctivae and EOM are normal.  Cardiovascular: Normal rate, regular rhythm, normal heart sounds and intact distal pulses.  No murmur heard. Pulmonary/Chest: Effort normal and breath sounds normal. No stridor. No respiratory distress. + scant wheezes.  Neurological: Is alert and oriented to person, place, and time.  Skin: Skin is warm. Capillary refill takes less than 2 seconds.  Psychiatric: Has a normal mood and affect. Behavior is normal. Judgment and thought content normal.    Health Maintenance Due  Topic Date Due  . MAMMOGRAM  09/03/2018  . COLONOSCOPY  06/04/2019    There are no preventive care reminders to display for this patient.  Lab Results  Component Value Date   TSH 0.869 09/05/2019   Lab Results  Component Value Date   WBC 8.3 11/08/2019   HGB 11.7 (L) 11/08/2019   HCT 32.6 (L) 11/08/2019   MCV 106.2 (H) 11/08/2019   PLT 273 11/08/2019   Lab Results  Component Value Date   NA 133 (L) 11/08/2019   K 3.6 11/08/2019   CO2 23 11/08/2019   GLUCOSE 105 (H) 11/08/2019   BUN 9 11/08/2019   CREATININE 1.30 (H) 11/08/2019   BILITOT 0.8 11/08/2019   ALKPHOS 58 11/08/2019   AST 74 (H) 11/08/2019   ALT 46 (H) 11/08/2019   PROT 6.5 11/08/2019   ALBUMIN 3.3 (L) 11/08/2019   CALCIUM 8.7 (L) 11/08/2019   ANIONGAP 9  11/08/2019   Lab Results  Component Value Date   CHOL 203 (H) 09/05/2019   Lab Results  Component Value Date   HDL 91 09/05/2019   Lab Results  Component Value Date   LDLCALC 98 09/05/2019   Lab Results  Component Value Date   TRIG 79 09/05/2019   Lab Results  Component Value Date   CHOLHDL 2.2 09/05/2019   Lab Results  Component Value Date   HGBA1C 5.2 08/29/2019      Assessment & Plan:   Problem List Items Addressed This Visit      Respiratory   COPD (chronic obstructive pulmonary disease) ([place])     Other   Smoker   Relevant Orders   DG Bone Density   Other  psychoactive substance dependence, in remission ([place])    Other Visit Diagnoses    Gross hematuria    -  Primary   Relevant Orders   Ambulatory referral to Urology   Numbness and tingling in left arm       Urinary incontinence, mixed       Relevant Orders   Ambulatory referral to Urology   Encounter for medication refill       Estrogen deficiency       Relevant Orders   DG Bone Density     Nicotine dependence/emphysema Pt advised to quit smoking but she is in precontemplation and is using smoking for stress management  Estrogen deficiency/hematuria Discussed that with her heavy smoking she would not be given estrogen unless she can decrease her smoking Referral placed to Urology for hematuria but it could be from atrophy of the urethral meatus Advised bone density due to her smoking and estrogen deficiency and her thin body habitus  COPD Discussed taking her copd meds  Discussed smoking cessation   Meds ordered this encounter  Medications  . DISCONTD: Respiratory Therapy Supplies (NEBULIZER AIR TUBE/PLUGS) MISC    Sig: Dispense air tubes/plugs for replacement for nebulizer machine    Dispense:  1 each    Refill:  0    Follow-up: No follow-ups on file.    Forrest Moron, MD

## 2019-10-01 ENCOUNTER — Other Ambulatory Visit: Payer: Self-pay | Admitting: Neurology

## 2019-10-03 ENCOUNTER — Other Ambulatory Visit: Payer: Self-pay | Admitting: Family Medicine

## 2019-10-03 DIAGNOSIS — B001 Herpesviral vesicular dermatitis: Secondary | ICD-10-CM

## 2019-10-03 DIAGNOSIS — R21 Rash and other nonspecific skin eruption: Secondary | ICD-10-CM

## 2019-10-04 ENCOUNTER — Encounter: Payer: Self-pay | Admitting: Gastroenterology

## 2019-10-11 ENCOUNTER — Emergency Department (HOSPITAL_COMMUNITY): Payer: 59

## 2019-10-11 ENCOUNTER — Observation Stay (HOSPITAL_COMMUNITY)
Admission: EM | Admit: 2019-10-11 | Discharge: 2019-10-12 | Disposition: A | Discharge status: Home / Self Care | Payer: 59 | Attending: Internal Medicine | Admitting: Internal Medicine

## 2019-10-11 ENCOUNTER — Encounter (HOSPITAL_COMMUNITY): Payer: Self-pay | Admitting: Emergency Medicine

## 2019-10-11 ENCOUNTER — Other Ambulatory Visit: Payer: Self-pay

## 2019-10-11 DIAGNOSIS — E876 Hypokalemia: Secondary | ICD-10-CM | POA: Diagnosis not present

## 2019-10-11 DIAGNOSIS — K219 Gastro-esophageal reflux disease without esophagitis: Secondary | ICD-10-CM | POA: Diagnosis not present

## 2019-10-11 DIAGNOSIS — Z818 Family history of other mental and behavioral disorders: Secondary | ICD-10-CM | POA: Diagnosis not present

## 2019-10-11 DIAGNOSIS — Z8249 Family history of ischemic heart disease and other diseases of the circulatory system: Secondary | ICD-10-CM | POA: Diagnosis not present

## 2019-10-11 DIAGNOSIS — Z7951 Long term (current) use of inhaled steroids: Secondary | ICD-10-CM | POA: Diagnosis not present

## 2019-10-11 DIAGNOSIS — Z20828 Contact with and (suspected) exposure to other viral communicable diseases: Secondary | ICD-10-CM | POA: Diagnosis not present

## 2019-10-11 DIAGNOSIS — F419 Anxiety disorder, unspecified: Secondary | ICD-10-CM | POA: Diagnosis not present

## 2019-10-11 DIAGNOSIS — Z79899 Other long term (current) drug therapy: Secondary | ICD-10-CM | POA: Diagnosis not present

## 2019-10-11 DIAGNOSIS — Z888 Allergy status to other drugs, medicaments and biological substances status: Secondary | ICD-10-CM | POA: Diagnosis not present

## 2019-10-11 DIAGNOSIS — F250 Schizoaffective disorder, bipolar type: Secondary | ICD-10-CM | POA: Diagnosis not present

## 2019-10-11 DIAGNOSIS — I081 Rheumatic disorders of both mitral and tricuspid valves: Secondary | ICD-10-CM | POA: Diagnosis not present

## 2019-10-11 DIAGNOSIS — F17210 Nicotine dependence, cigarettes, uncomplicated: Secondary | ICD-10-CM | POA: Diagnosis not present

## 2019-10-11 DIAGNOSIS — Z886 Allergy status to analgesic agent status: Secondary | ICD-10-CM | POA: Diagnosis not present

## 2019-10-11 DIAGNOSIS — I119 Hypertensive heart disease without heart failure: Secondary | ICD-10-CM | POA: Diagnosis not present

## 2019-10-11 DIAGNOSIS — G40909 Epilepsy, unspecified, not intractable, without status epilepticus: Secondary | ICD-10-CM | POA: Insufficient documentation

## 2019-10-11 DIAGNOSIS — R319 Hematuria, unspecified: Secondary | ICD-10-CM | POA: Diagnosis not present

## 2019-10-11 DIAGNOSIS — R55 Syncope and collapse: Secondary | ICD-10-CM | POA: Diagnosis present

## 2019-10-11 DIAGNOSIS — M1990 Unspecified osteoarthritis, unspecified site: Secondary | ICD-10-CM | POA: Diagnosis not present

## 2019-10-11 DIAGNOSIS — J449 Chronic obstructive pulmonary disease, unspecified: Secondary | ICD-10-CM | POA: Diagnosis not present

## 2019-10-11 DIAGNOSIS — I951 Orthostatic hypotension: Secondary | ICD-10-CM | POA: Diagnosis not present

## 2019-10-11 LAB — CBC WITH DIFFERENTIAL/PLATELET
Abs Immature Granulocytes: 0.01 10*3/uL (ref 0.00–0.07)
Basophils Absolute: 0.1 10*3/uL (ref 0.0–0.1)
Basophils Relative: 1 %
Eosinophils Absolute: 0.1 10*3/uL (ref 0.0–0.5)
Eosinophils Relative: 1 %
HCT: 38.7 % (ref 36.0–46.0)
Hemoglobin: 13.2 g/dL (ref 12.0–15.0)
Immature Granulocytes: 0 %
Lymphocytes Relative: 19 %
Lymphs Abs: 1.3 10*3/uL (ref 0.7–4.0)
MCH: 37.1 pg — ABNORMAL HIGH (ref 26.0–34.0)
MCHC: 34.1 g/dL (ref 30.0–36.0)
MCV: 108.7 fL — ABNORMAL HIGH (ref 80.0–100.0)
Monocytes Absolute: 0.6 10*3/uL (ref 0.1–1.0)
Monocytes Relative: 8 %
Neutro Abs: 4.8 10*3/uL (ref 1.7–7.7)
Neutrophils Relative %: 71 %
Platelets: 175 10*3/uL (ref 150–400)
RBC: 3.56 MIL/uL — ABNORMAL LOW (ref 3.87–5.11)
RDW: 12.8 % (ref 11.5–15.5)
WBC: 6.8 10*3/uL (ref 4.0–10.5)
nRBC: 0 % (ref 0.0–0.2)

## 2019-10-11 LAB — CBC
HCT: 38.8 % (ref 36.0–46.0)
Hemoglobin: 13.3 g/dL (ref 12.0–15.0)
MCH: 37.6 pg — ABNORMAL HIGH (ref 26.0–34.0)
MCHC: 34.3 g/dL (ref 30.0–36.0)
MCV: 109.6 fL — ABNORMAL HIGH (ref 80.0–100.0)
Platelets: 162 10*3/uL (ref 150–400)
RBC: 3.54 MIL/uL — ABNORMAL LOW (ref 3.87–5.11)
RDW: 12.8 % (ref 11.5–15.5)
WBC: 7.8 10*3/uL (ref 4.0–10.5)
nRBC: 0 % (ref 0.0–0.2)

## 2019-10-11 LAB — TROPONIN I (HIGH SENSITIVITY)
Troponin I (High Sensitivity): 5 ng/L (ref <18)
Troponin I (High Sensitivity): 3 ng/L (ref <18)
Troponin I (High Sensitivity): 3 ng/L (ref <18)

## 2019-10-11 LAB — ACETAMINOPHEN LEVEL: Acetaminophen (Tylenol), Serum: <10 ug/mL — ABNORMAL LOW (ref 10–30)

## 2019-10-11 LAB — RAPID URINE DRUG SCREEN, HOSP PERFORMED
Amphetamines: NOT DETECTED
Barbiturates: NOT DETECTED
Benzodiazepines: NOT DETECTED
Cocaine: NOT DETECTED
Opiates: NOT DETECTED
Tetrahydrocannabinol: POSITIVE — AB

## 2019-10-11 LAB — CREATININE, SERUM
Creatinine, Ser: 0.8 mg/dL (ref 0.44–1.00)
GFR calc Af Amer: >60 mL/min (ref >60)
GFR calc non Af Amer: >60 mL/min (ref >60)

## 2019-10-11 LAB — COMPREHENSIVE METABOLIC PANEL
ALT: 52 U/L — ABNORMAL HIGH (ref 0–44)
AST: 90 U/L — ABNORMAL HIGH (ref 15–41)
Albumin: 3.9 g/dL (ref 3.5–5.0)
Alkaline Phosphatase: 72 U/L (ref 38–126)
Anion gap: 9 (ref 5–15)
BUN: 6 mg/dL (ref 6–20)
CO2: 26 mmol/L (ref 22–32)
Calcium: 9.5 mg/dL (ref 8.9–10.3)
Chloride: 102 mmol/L (ref 98–111)
Creatinine, Ser: 1.04 mg/dL — ABNORMAL HIGH (ref 0.44–1.00)
GFR calc Af Amer: >60 mL/min (ref >60)
GFR calc non Af Amer: 58 mL/min — ABNORMAL LOW (ref >60)
Glucose, Bld: 134 mg/dL — ABNORMAL HIGH (ref 70–99)
Potassium: 3.1 mmol/L — ABNORMAL LOW (ref 3.5–5.1)
Sodium: 137 mmol/L (ref 135–145)
Total Bilirubin: 0.9 mg/dL (ref 0.3–1.2)
Total Protein: 7.5 g/dL (ref 6.5–8.1)

## 2019-10-11 LAB — SALICYLATE LEVEL: Salicylate Lvl: <7 mg/dL (ref 2.8–30.0)

## 2019-10-11 LAB — URINALYSIS, ROUTINE W REFLEX MICROSCOPIC
Bilirubin Urine: NEGATIVE
Glucose, UA: NEGATIVE mg/dL
Hgb urine dipstick: NEGATIVE
Ketones, ur: NEGATIVE mg/dL
Leukocytes,Ua: NEGATIVE
Nitrite: NEGATIVE
Protein, ur: NEGATIVE mg/dL
Specific Gravity, Urine: 1.008 (ref 1.005–1.030)
pH: 7 (ref 5.0–8.0)

## 2019-10-11 LAB — SARS CORONAVIRUS 2 BY RT PCR (HOSPITAL ORDER, PERFORMED IN ~~LOC~~ HOSPITAL LAB): SARS Coronavirus 2: NEGATIVE

## 2019-10-11 LAB — LIPASE, BLOOD: Lipase: 26 U/L (ref 11–51)

## 2019-10-11 LAB — ETHANOL: Alcohol, Ethyl (B): 95 mg/dL — ABNORMAL HIGH (ref <10)

## 2019-10-11 LAB — D-DIMER, QUANTITATIVE: D-Dimer, Quant: 0.32 ug/mL-FEU (ref 0.00–0.50)

## 2019-10-11 MED ORDER — SODIUM CHLORIDE 0.9 % IV BOLUS
1000.0000 mL | Freq: Once | INTRAVENOUS | Status: AC
  Administered 2019-10-11: 1000 mL via INTRAVENOUS

## 2019-10-11 MED ORDER — TIZANIDINE HCL 4 MG PO TABS
4.0000 mg | ORAL_TABLET | Freq: Every day | ORAL | Status: DC
  Administered 2019-10-12: 4 mg via ORAL
  Filled 2019-10-11: qty 1

## 2019-10-11 MED ORDER — QUETIAPINE FUMARATE 25 MG PO TABS
150.0000 mg | ORAL_TABLET | Freq: Every day | ORAL | Status: DC
  Administered 2019-10-11: 150 mg via ORAL
  Filled 2019-10-11: qty 1

## 2019-10-11 MED ORDER — MOMETASONE FURO-FORMOTEROL FUM 200-5 MCG/ACT IN AERO
2.0000 | INHALATION_SPRAY | Freq: Two times a day (BID) | RESPIRATORY_TRACT | Status: DC
  Administered 2019-10-11 – 2019-10-12 (×2): 2 via RESPIRATORY_TRACT
  Filled 2019-10-11: qty 8.8

## 2019-10-11 MED ORDER — LORAZEPAM 2 MG/ML IJ SOLN
1.0000 mg | Freq: Once | INTRAMUSCULAR | Status: AC
  Administered 2019-10-11: 1 mg via INTRAVENOUS
  Filled 2019-10-11: qty 1

## 2019-10-11 MED ORDER — POTASSIUM CHLORIDE CRYS ER 20 MEQ PO TBCR
40.0000 meq | EXTENDED_RELEASE_TABLET | Freq: Once | ORAL | Status: AC
  Administered 2019-10-11: 40 meq via ORAL
  Filled 2019-10-11: qty 2

## 2019-10-11 MED ORDER — TRAMADOL HCL 50 MG PO TABS
50.0000 mg | ORAL_TABLET | Freq: Four times a day (QID) | ORAL | Status: DC | PRN
  Administered 2019-10-11 – 2019-10-12 (×2): 50 mg via ORAL
  Filled 2019-10-11 (×2): qty 1

## 2019-10-11 MED ORDER — IPRATROPIUM-ALBUTEROL 0.5-2.5 (3) MG/3ML IN SOLN: 3.0000 mL | Freq: Four times a day (QID) | RESPIRATORY_TRACT | Status: DC | PRN

## 2019-10-11 MED ORDER — GABAPENTIN 400 MG PO CAPS
400.0000 mg | ORAL_CAPSULE | Freq: Three times a day (TID) | ORAL | Status: DC
  Administered 2019-10-11 – 2019-10-12 (×2): 400 mg via ORAL
  Filled 2019-10-11 (×2): qty 1

## 2019-10-11 MED ORDER — SODIUM CHLORIDE 0.9 % IV BOLUS: 1000.0000 mL | Freq: Once | INTRAVENOUS | Status: DC

## 2019-10-11 MED ORDER — ALBUTEROL SULFATE HFA 108 (90 BASE) MCG/ACT IN AERS: 2.0000 | INHALATION_SPRAY | Freq: Four times a day (QID) | RESPIRATORY_TRACT | Status: DC | PRN

## 2019-10-11 MED ORDER — CITALOPRAM HYDROBROMIDE 20 MG PO TABS
20.0000 mg | ORAL_TABLET | Freq: Every day | ORAL | Status: DC
  Administered 2019-10-12: 20 mg via ORAL
  Filled 2019-10-11: qty 1

## 2019-10-11 MED ORDER — ACETAMINOPHEN 650 MG RE SUPP: 650.0000 mg | Freq: Four times a day (QID) | RECTAL | Status: DC | PRN

## 2019-10-11 MED ORDER — ACETAMINOPHEN 325 MG PO TABS
650.0000 mg | ORAL_TABLET | Freq: Four times a day (QID) | ORAL | Status: DC | PRN
  Administered 2019-10-12: 650 mg via ORAL
  Filled 2019-10-11: qty 2

## 2019-10-11 MED ORDER — ENOXAPARIN SODIUM 40 MG/0.4ML ~~LOC~~ SOLN
40.0000 mg | SUBCUTANEOUS | Status: DC
  Administered 2019-10-11: 40 mg via SUBCUTANEOUS
  Filled 2019-10-11: qty 0.4

## 2019-10-11 MED ORDER — SODIUM CHLORIDE 0.9 % IV SOLN
INTRAVENOUS | Status: DC
  Administered 2019-10-11: 22:00:00 via INTRAVENOUS

## 2019-10-11 MED ORDER — MORPHINE SULFATE (PF) 2 MG/ML IV SOLN
1.0000 mg | Freq: Once | INTRAVENOUS | Status: AC
  Administered 2019-10-11: 1 mg via INTRAVENOUS
  Filled 2019-10-11: qty 1

## 2019-10-11 MED ORDER — LAMOTRIGINE 25 MG PO TABS
50.0000 mg | ORAL_TABLET | Freq: Every day | ORAL | Status: DC
  Administered 2019-10-12: 50 mg via ORAL
  Filled 2019-10-11: qty 2

## 2019-10-11 NOTE — H&P (Addendum)
TRH H&P    Patient Demographics:    Melissa Bridges, is a 58 y.o. female  MRN: [id number]  DOB - 1961-12-06  Admit Date - 10/11/2019  Referring MD/NP/PA: Dr. Darl Householder  Outpatient Primary MD for the patient is Forrest Moron, MD  Patient coming from: Home  Chief complaint-dizziness   HPI:    Melissa Bridges  is a 58 y.o. female, with history of anxiety, schizoaffective disorder, COPD, hypertension who came to ED with complaints of dizziness.  Patient says that she has been dizzy and lightheaded for past month or so.  She also had left arm numbness that has been going on for several weeks.  She has history of seizures, but no recent seizures.  As per daughter at bedside patient has been having recurrent episodes of syncope.  Her blood pressure medication was adjusted recently. She denies chest pain, shortness of breath. Denies fever or dysuria Denies abdominal pain  In the ED patient was found to be orthostatic with systolic blood pressure dropping to 70s on standing.  She was given IV normal saline fluid boluses.  Blood pressure has improved 129/85   Review of systems:    In addition to the HPI above,   All other systems reviewed and are negative.    Past History of the following :    Past Medical History:  Diagnosis Date  . Allergy   . Alopecia   . Anxiety   . Arthritis   . Asthma   . Cataract    "mild"  . COPD (chronic obstructive pulmonary disease) ([place])   . Depression   . GERD (gastroesophageal reflux disease)    o cc- takes OTC if needed.  . Glaucoma   . Hepatitis C   . Hypertension    onset age 76.  . Iron deficiency anemia   . Migraine   . Schizoaffective disorder ([place])   . Sciatic pain   . Seizures ([place])    onset in childhood. 06-03-16- per pt 8 months agoGeneralized tonic-clonic.Last seizure couple of months ago- last sz 9-10 months ago per pt 10-12-2016  . Shortness of breath  dyspnea   . Substance abuse ([place])   . Ulcer       Past Surgical History:  Procedure Laterality Date  . ABDOMINAL HYSTERECTOMY     ovarian cyst B, cervical dysplasia, fibroids.   Ovaries intact.  [name] BIOPSY  07/11/2018   Procedure: BIOPSY;  Surgeon: Yetta Flock, MD;  Location: [facility];  Service: Gastroenterology;;  . COLONOSCOPY    . COLONOSCOPY W/ BIOPSIES    . DILATION AND CURETTAGE OF UTERUS     x2  . ESOPHAGOGASTRODUODENOSCOPY (EGD) WITH PROPOFOL N/A 07/11/2018   Procedure: ESOPHAGOGASTRODUODENOSCOPY (EGD) WITH PROPOFOL;  Surgeon: Yetta Flock, MD;  Location: [facility];  Service: Gastroenterology;  Laterality: N/A;  . EXPLORATORY LAPAROTOMY     x3  . GYNECOLOGIC CRYOSURGERY     x3  . HOT HEMOSTASIS N/A 07/11/2018   Procedure: HOT HEMOSTASIS (ARGON PLASMA COAGULATION/BICAP);  Surgeon: Yetta Flock, MD;  Location:  [facility];  Service: Gastroenterology;  Laterality: N/A;  . MANDIBLE RECONSTRUCTION N/A 06/27/2015   Procedure: REMOVAL MAXILLARY PALATAL TORUS.  REMOVAL MANDIBULAR TORUS AND EXOSTOSIS.  ;  Surgeon: Diona Browner, DDS;  Location: [place];  Service: Oral Surgery;  Laterality: N/A;  . OVARIAN CYST REMOVAL  2008  . POLYPECTOMY        Social History:      Social History   Tobacco Use  . Smoking status: Current Every Day Smoker    Packs/day: 0.33    Years: 44.00    Pack years: 14.52    Types: Cigarettes  . Smokeless tobacco: Never Used  Substance Use Topics  . Alcohol use: Yes    Alcohol/week: 0.0 standard drinks    Comment: 1 pakj every 3 days       Family History :     Family History  Problem Relation Age of Onset  . Diabetes type II Mother   . Hypertension Mother   . Arthritis Mother   . Diabetes Mother   . Mental illness Mother        bipolar  . Diabetes type II Maternal Aunt   . Hypertension Father   . Heart murmur Father   . Arthritis Father   . Stroke Father   . Alopecia Sister   . Alopecia Brother   .  Alopecia Sister   . Mental retardation Sister        depression  . Prostate cancer Paternal Grandfather   . Cancer Maternal Uncle   . Colon cancer Neg Hx   . Esophageal cancer Neg Hx   . Rectal cancer Neg Hx   . Stomach cancer Neg Hx   . Colon polyps Neg Hx       Home Medications:   Prior to Admission medications   Medication Sig Start Date End Date Taking? Authorizing Provider  acetaminophen (TYLENOL) 325 MG tablet Take 5,200 mg by mouth every 6 (six) hours as needed for mild pain.   Yes [provider]  albuterol (PROAIR HFA) 108 (90 Base) MCG/ACT inhaler INHALE 2 PUFFS BY MOUTH EVERY 6 HOURS AS NEEDED FOR SHORTNESS OF BREATH Patient taking differently: Inhale 2 puffs into the lungs every 6 (six) hours as needed for shortness of breath.  06/20/19  Yes Rutherford Guys, MD  amLODipine (NORVASC) 5 MG tablet Take 1 tablet (5 mg total) by mouth daily. 09/05/19  Yes Forrest Moron, MD  budesonide-formoterol (SYMBICORT) 160-4.5 MCG/ACT inhaler Inhale 2 puffs into the lungs 2 (two) times daily. 03/28/19  Yes Corum, Rex Kras, MD  citalopram (CELEXA) 20 MG tablet Take 1 tablet (20 mg total) by mouth daily. 08/29/19  Yes Stallings, Zoe A, MD  diphenhydrAMINE-APAP, sleep, (TYLENOL PM EXTRA STRENGTH PO) Take 500 mg by mouth once.    Yes [provider]  gabapentin (NEURONTIN) 400 MG capsule Take 1 capsule (400 mg total) by mouth 3 (three) times daily. 05/21/19  Yes Stallings, Zoe A, MD  ipratropium-albuterol (DUONEB) 0.5-2.5 (3) MG/3ML SOLN Take 3 mLs by nebulization every 6 (six) hours as needed (shortness of breath). Reported on 12/02/2015 03/28/19  Yes Corum, Rex Kras, MD  lamoTRIgine (LAMICTAL) 25 MG tablet Take 2 tablets (50 mg total) by mouth daily. 06/25/19  Yes Cameron Sprang, MD  Multiple Vitamin (MULTIVITAMIN WITH MINERALS) TABS tablet Take 1 tablet by mouth daily. 07/16/18  Yes Emokpae, Courage, MD  ondansetron (ZOFRAN) 4 MG tablet TAKE 1 TABLET BY MOUTH EVERY 6 HOURS AS NEEDED FOR  NAUSEA 08/27/19  Yes Stallings, Zoe A, MD  QUEtiapine (SEROQUEL) 50 MG tablet Take 3 tablets (150 mg total) by mouth at bedtime. 07/15/18  Yes Emokpae, Courage, MD  thiamine 100 MG tablet Take 1 tablet (100 mg total) by mouth daily. 02/05/19  Yes Rutherford Guys, MD  tiZANidine (ZANAFLEX) 4 MG tablet Take one tablet daily 05/21/19  Yes Stallings, Zoe A, MD  triamcinolone cream (KENALOG) 0.1 % APPLY CREAM EXTERNALLY TWICE DAILY Patient taking differently: Apply 1 application topically 2 (two) times daily.  10/03/19  Yes Stallings, Zoe A, MD  valACYclovir (VALTREX) 1000 MG tablet TAKE 2 TABLETS BY MOUTH AT SYMPTOM ONSET, REPEAT IN 12 HOURS Patient taking differently: Take 2,000 mg by mouth See admin instructions. TAKE 2 TABLETS BY MOUTH AT SYMPTOM ONSET, REPEAT IN 12 HOURS 10/03/19  Yes Rutherford Guys, MD  diclofenac sodium (VOLTAREN) 1 % GEL Apply 2 g topically 4 (four) times daily. Patient not taking: Reported on 10/11/2019 05/24/18   Debbe Odea, MD  ferrous sulfate 325 (65 FE) MG tablet Take 1 tablet (325 mg total) by mouth 2 (two) times daily with a meal. Patient not taking: Reported on 10/11/2019 07/05/18   Wardell Honour, MD  folic acid (FOLVITE) 1 MG tablet Take 1 tablet (1 mg total) by mouth daily. Patient not taking: Reported on 10/11/2019 07/16/18   Roxan Hockey, MD  methocarbamol (ROBAXIN) 750 MG tablet Take 1 tablet (750 mg total) by mouth 3 (three) times daily. Patient not taking: Reported on 09/28/2019 08/29/19   Forrest Moron, MD  metroNIDAZOLE (FLAGYL) 500 MG tablet Take 1 tablet (500 mg total) by mouth 2 (two) times daily. Patient not taking: Reported on 10/11/2019 09/06/19   Forrest Moron, MD  pantoprazole (PROTONIX) 40 MG tablet TAKE 1 TABLET BY MOUTH TWICE DAILY BEFORE A MEAL Patient not taking: Reported on 10/11/2019 07/13/19   Forrest Moron, MD  polyethylene glycol [facility]) packet Take 17 g by mouth daily. Patient not taking: Reported on 10/11/2019 07/15/18   Roxan Hockey, MD  Respiratory Therapy Supplies (NEBULIZER AIR TUBE/PLUGS) MISC Dispense air tubes/plugs for replacement for nebulizer machine Patient not taking: Reported on 10/11/2019 09/28/19   Forrest Moron, MD  senna-docusate (SENOKOT-S) 8.6-50 MG tablet Take 2 tablets by mouth at bedtime. Patient not taking: Reported on 10/11/2019 02/05/19   Rutherford Guys, MD     Allergies:     Allergies  Allergen Reactions  . Fruit & Vegetable Daily [Nutritional Supplements] Shortness Of Breath    Aloe  . Iodinated Diagnostic Agents Swelling    Swelling and itching of left side of her face only after CT SI injection(no steroid used)  . Ibuprofen Other (See Comments)    Stomach upset; However, pt can take Meloxicam without incident and takes this at home.   Vanita Ingles Rash     Physical Exam:   Vitals  Blood pressure 129/85, pulse 82, temperature 97.9 F (36.6 C), temperature source Oral, resp. rate 13, SpO2 100 %.  1.  General: Appears in no acute distress  2. Psychiatric: Alert, oriented x3, intact insight and judgment  3. Neurologic: Cranial nerves II through XII grossly intact, motor strength slightly reduced in left upper extremity, sensations intact bilaterally  4. HEENMT:  Atraumatic normocephalic, extraocular muscles are intact  5. Respiratory : Clear to auscultation bilaterally, no wheezing or crackles auscultated  6. Cardiovascular : S1-S2, regular, no murmur auscultated  7. Gastrointestinal:  Abdomen is soft, nontender, no organomegaly  Data Review:    CBC Recent Labs  Lab 10/11/19 0950  WBC 6.8  HGB 13.2  HCT 38.7  PLT 175  MCV 108.7*  MCH 37.1*  MCHC 34.1  RDW 12.8  LYMPHSABS 1.3  MONOABS 0.6  EOSABS 0.1  BASOSABS 0.1   ------------------------------------------------------------------------------------------------------------------  Results for orders placed or performed during the hospital encounter of 10/11/19 (from the past 48 hour(s))   SARS Coronavirus 2 by RT PCR (hospital order, performed in [facility] hospital lab) Nasopharyngeal Nasopharyngeal Swab     Status: None   Collection Time: 10/11/19  9:46 AM   Specimen: Nasopharyngeal Swab  Result Value Ref Range   SARS Coronavirus 2 NEGATIVE NEGATIVE    Comment: (NOTE) If result is NEGATIVE SARS-CoV-2 target nucleic acids are NOT DETECTED. The SARS-CoV-2 RNA is generally detectable in upper and lower  respiratory specimens during the acute phase of infection. The lowest  concentration of SARS-CoV-2 viral copies this assay can detect is 250  copies / mL. A negative result does not preclude SARS-CoV-2 infection  and should not be used as the sole basis for treatment or other  patient management decisions.  A negative result may occur with  improper specimen collection / handling, submission of specimen other  than nasopharyngeal swab, presence of viral mutation(s) within the  areas targeted by this assay, and inadequate number of viral copies  (<250 copies / mL). A negative result must be combined with clinical  observations, patient history, and epidemiological information. If result is POSITIVE SARS-CoV-2 target nucleic acids are DETECTED. The SARS-CoV-2 RNA is generally detectable in upper and lower  respiratory specimens dur ing the acute phase of infection.  Positive  results are indicative of active infection with SARS-CoV-2.  Clinical  correlation with patient history and other diagnostic information is  necessary to determine patient infection status.  Positive results do  not rule out bacterial infection or co-infection with other viruses. If result is PRESUMPTIVE POSTIVE SARS-CoV-2 nucleic acids MAY BE PRESENT.   A presumptive positive result was obtained on the submitted specimen  and confirmed on repeat testing.  While 2019 novel coronavirus  (SARS-CoV-2) nucleic acids may be present in the submitted sample  additional confirmatory testing may be  necessary for epidemiological  and / or clinical management purposes  to differentiate between  SARS-CoV-2 and other Sarbecovirus currently known to infect humans.  If clinically indicated additional testing with an alternate test  methodology [phone]) is advised. The SARS-CoV-2 RNA is generally  detectable in upper and lower respiratory sp ecimens during the acute  phase of infection. The expected result is Negative. Fact Sheet for Patients:  StrictlyIdeas.no Fact Sheet for Healthcare Providers: BankingDealers.co.za This test is not yet approved or cleared by the [place] FDA and has been authorized for detection and/or diagnosis of SARS-CoV-2 by FDA under an Emergency Use Authorization (EUA).  This EUA will remain in effect (meaning this test can be used) for the duration of the COVID-19 declaration under Section 564(b)(1) of the Act, 21 U.S.C. section 360bbb-3(b)(1), unless the authorization is terminated or revoked sooner. Performed at [facility], [place] [address] [place], [place] [address]   CBC with Differential/Platelet     Status: Abnormal   Collection Time: 10/11/19  9:50 AM  Result Value Ref Range   WBC 6.8 4.0 - 10.5 K/uL   RBC 3.56 (L) 3.87 - 5.11 MIL/uL   Hemoglobin 13.2 12.0 - 15.0 g/dL   HCT 38.7 36.0 - 46.0 %  MCV 108.7 (H) 80.0 - 100.0 fL   MCH 37.1 (H) 26.0 - 34.0 pg   MCHC 34.1 30.0 - 36.0 g/dL   RDW 12.8 11.5 - 15.5 %   Platelets 175 150 - 400 K/uL   nRBC 0.0 0.0 - 0.2 %   Neutrophils Relative % 71 %   Neutro Abs 4.8 1.7 - 7.7 K/uL   Lymphocytes Relative 19 %   Lymphs Abs 1.3 0.7 - 4.0 K/uL   Monocytes Relative 8 %   Monocytes Absolute 0.6 0.1 - 1.0 K/uL   Eosinophils Relative 1 %   Eosinophils Absolute 0.1 0.0 - 0.5 K/uL   Basophils Relative 1 %   Basophils Absolute 0.1 0.0 - 0.1 K/uL   Immature Granulocytes 0 %   Abs Immature Granulocytes 0.01 0.00 - 0.07 K/uL    Comment:  Performed at [facility], [place] [address], [place], [place] [address]  Comprehensive metabolic panel     Status: Abnormal   Collection Time: 10/11/19  9:50 AM  Result Value Ref Range   Sodium 137 135 - 145 mmol/L   Potassium 3.1 (L) 3.5 - 5.1 mmol/L   Chloride 102 98 - 111 mmol/L   CO2 26 22 - 32 mmol/L   Glucose, Bld 134 (H) 70 - 99 mg/dL   BUN 6 6 - 20 mg/dL   Creatinine, Ser 1.04 (H) 0.44 - 1.00 mg/dL   Calcium 9.5 8.9 - 10.3 mg/dL   Total Protein 7.5 6.5 - 8.1 g/dL   Albumin 3.9 3.5 - 5.0 g/dL   AST 90 (H) 15 - 41 U/L   ALT 52 (H) 0 - 44 U/L   Alkaline Phosphatase 72 38 - 126 U/L   Total Bilirubin 0.9 0.3 - 1.2 mg/dL   GFR calc non Af Amer 58 (L) >60 mL/min   GFR calc Af Amer >60 >60 mL/min   Anion gap 9 5 - 15    Comment: Performed at [facility], [place] [address], [place], [place] [address]  Troponin I (High Sensitivity)     Status: None   Collection Time: 10/11/19  9:50 AM  Result Value Ref Range   Troponin I (High Sensitivity) 5 <18 ng/L    Comment: (NOTE) Elevated high sensitivity troponin I (hsTnI) values and significant  changes across serial measurements may suggest ACS but many other  chronic and acute conditions are known to elevate hsTnI results.  Refer to the "Links" section for chest pain algorithms and additional  guidance. Performed at [facility], [place] [address], [place], [place] [address]   Ethanol     Status: Abnormal   Collection Time: 10/11/19  9:50 AM  Result Value Ref Range   Alcohol, Ethyl (B) 95 (H) <10 mg/dL    Comment: (NOTE) Lowest detectable limit for serum alcohol is 10 mg/dL. For medical purposes only. Performed at [facility], [place] [address], [place], [place] [address]   Acetaminophen level     Status: Abnormal   Collection Time: 10/11/19  9:50 AM  Result Value Ref Range   Acetaminophen (Tylenol), Serum <10 (L) 10 - 30 ug/mL    Comment: (NOTE) Therapeutic  concentrations vary significantly. A range of 10-30 ug/mL  may be an effective concentration for many patients. However, some  are best treated at concentrations outside of this range. Acetaminophen concentrations >150 ug/mL at 4 hours after ingestion  and >50 ug/mL at 12 hours after ingestion are often associated with  toxic  reactions. Performed at [facility], [place] [address] [place], [place] [id number]   Salicylate level     Status: None   Collection Time: 10/11/19  9:50 AM  Result Value Ref Range   Salicylate Lvl [id number] 2.8 - 30.0 mg/dL    Comment: Performed at [facility], [place] [address], [place], [place] [address]  D-dimer, quantitative (not at [facility])     Status: None   Collection Time: 10/11/19  9:50 AM  Result Value Ref Range   D-Dimer, Quant 0.32 0.00 - 0.50 ug/mL-FEU    Comment: (NOTE) At the manufacturer cut-off of 0.50 ug/mL FEU, this assay has been documented to exclude PE with a sensitivity and negative predictive value of 97 to 99%.  At this time, this assay has not been approved by the FDA to exclude DVT/VTE. Results should be correlated with clinical presentation. Performed at [facility], [place] [address], [place], [place] [address]   Lipase, blood     Status: None   Collection Time: 10/11/19  9:50 AM  Result Value Ref Range   Lipase 26 11 - 51 U/L    Comment: Performed at [facility], [place] [address] [place], [place] [address]  Troponin I (High Sensitivity)     Status: None   Collection Time: 10/11/19 12:36 PM  Result Value Ref Range   Troponin I (High Sensitivity) 3 <18 ng/L    Comment: (NOTE) Elevated high sensitivity troponin I (hsTnI) values and significant  changes across serial measurements may suggest ACS but many other  chronic and acute conditions are known to elevate hsTnI results.  Refer to the "Links" section for chest pain algorithms and additional  guidance. Performed  at [facility], [place] [address] [place], [place] [address]   Urinalysis, Routine w reflex microscopic     Status: Abnormal   Collection Time: 10/11/19 12:53 PM  Result Value Ref Range   Color, Urine YELLOW YELLOW   APPearance HAZY (A) CLEAR   Specific Gravity, Urine 1.008 1.005 - 1.030   pH 7.0 5.0 - 8.0   Glucose, UA NEGATIVE NEGATIVE mg/dL   Hgb urine dipstick NEGATIVE NEGATIVE   Bilirubin Urine NEGATIVE NEGATIVE   Ketones, ur NEGATIVE NEGATIVE mg/dL   Protein, ur NEGATIVE NEGATIVE mg/dL   Nitrite NEGATIVE NEGATIVE   Leukocytes,Ua NEGATIVE NEGATIVE    Comment: Performed at [place] [address], [place], [place] [address]  Rapid urine drug screen (hospital performed)     Status: Abnormal   Collection Time: 10/11/19 12:54 PM  Result Value Ref Range   Opiates NONE DETECTED NONE DETECTED   Cocaine NONE DETECTED NONE DETECTED   Benzodiazepines NONE DETECTED NONE DETECTED   Amphetamines NONE DETECTED NONE DETECTED   Tetrahydrocannabinol POSITIVE (A) NONE DETECTED   Barbiturates NONE DETECTED NONE DETECTED    Comment: (NOTE) DRUG SCREEN FOR MEDICAL PURPOSES ONLY.  IF CONFIRMATION IS NEEDED FOR ANY PURPOSE, NOTIFY LAB WITHIN 5 DAYS. LOWEST DETECTABLE LIMITS FOR URINE DRUG SCREEN Drug Class                     Cutoff (ng/mL) Amphetamine and metabolites    1000 Barbiturate and metabolites    200 Benzodiazepine                 [id number] Tricyclics and metabolites     300 Opiates and metabolites        300 Cocaine and metabolites  300 THC                            50 Performed at [facility], [place] [address] [place], [place] [address]     Chemistries  Recent Labs  Lab 10/11/19 0950  NA 137  K 3.1*  CL 102  CO2 26  GLUCOSE 134*  BUN 6  CREATININE 1.04*  CALCIUM 9.5  AST 90*  ALT 52*  ALKPHOS 72  BILITOT 0.9    ------------------------------------------------------------------------------------------------------------------  ------------------------------------------------------------------------------------------------------------------ GFR: Estimated Creatinine Clearance: 46.9 mL/min (A) (by C-G formula based on SCr of 1.04 mg/dL (H)). Liver Function Tests: Recent Labs  Lab 10/11/19 0950  AST 90*  ALT 52*  ALKPHOS 72  BILITOT 0.9  PROT 7.5  ALBUMIN 3.9   Recent Labs  Lab 10/11/19 0950  LIPASE 26   No results for input(s): AMMONIA in the last 168 hours. Coagulation Profile: No results for input(s): INR, PROTIME in the last 168 hours. Cardiac Enzymes: No results for input(s): CKTOTAL, CKMB, CKMBINDEX, TROPONINI in the last 168 hours. BNP (last 3 results) No results for input(s): PROBNP in the last 8760 hours. HbA1C: No results for input(s): HGBA1C in the last 72 hours. CBG: No results for input(s): GLUCAP in the last 168 hours. Lipid Profile: No results for input(s): CHOL, HDL, LDLCALC, TRIG, CHOLHDL, LDLDIRECT in the last 72 hours. Thyroid Function Tests: No results for input(s): TSH, T4TOTAL, FREET4, T3FREE, THYROIDAB in the last 72 hours. Anemia Panel: No results for input(s): VITAMINB12, FOLATE, FERRITIN, TIBC, IRON, RETICCTPCT in the last 72 hours.  --------------------------------------------------------------------------------------------------------------- Urine analysis:    Component Value Date/Time   COLORURINE YELLOW 10/11/2019 1253   APPEARANCEUR HAZY (A) 10/11/2019 1253   LABSPEC 1.008 10/11/2019 1253   PHURINE 7.0 10/11/2019 [place] 10/11/2019 1253   HGBUR NEGATIVE 10/11/2019 [place] 10/11/2019 1253   BILIRUBINUR negative 06/20/2019 0953   BILIRUBINUR neg 06/10/2015 1154   KETONESUR NEGATIVE 10/11/2019 1253   PROTEINUR NEGATIVE 10/11/2019 1253   UROBILINOGEN 0.2 06/20/2019 0953   UROBILINOGEN 0.2 08/25/2015 2049    NITRITE NEGATIVE 10/11/2019 1253   LEUKOCYTESUR NEGATIVE 10/11/2019 1253      Imaging Results:    Ct Head Wo Contrast  Result Date: 10/11/2019 CLINICAL DATA:  Unexplained dizziness, syncope EXAM: CT HEAD WITHOUT CONTRAST TECHNIQUE: Contiguous axial images were obtained from the base of the skull through the vertex without intravenous contrast. COMPARISON:  May 23, 2018 FINDINGS: Brain: There is no acute intracranial hemorrhage or mass effect. Suspected new small wedge-shaped area of hypoattenuation in the right occipital lobe likely reflecting subacute or chronic infarction. The ventricles and sulci are within normal limits in size and configuration. There is no extra-axial collection. Vascular: No hyperdense vessel or unexpected calcification. Skull: The calvarium is unremarkable. Sinuses/Orbits: Partially imaged paranasal sinuses and orbits are unremarkable. Other: Imaged mastoid air cells are clear. 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 No acute intracranial hemorrhage or mass effect. Electronically Signed   By: Macy Mis M.D.   On: 10/11/2019 12:55   Dg Chest Port 1 View  Result Date: 10/11/2019 CLINICAL DATA:  Shortness of breath. EXAM: PORTABLE CHEST 1 VIEW COMPARISON:  01/27/2015 FINDINGS: Cardiac silhouette is within normal limits considering the AP portable technique. Pulmonary vascularity is normal. Lungs are clear. Aortic atherosclerosis. No acute bone abnormality. Prominent osteophyte in the region of the right sixth costovertebral joint, unchanged. IMPRESSION:  1. No acute abnormalities. 2. Aortic atherosclerosis. Electronically Signed   By: Lorriane Shire M.D.   On: 10/11/2019 10:15   Ct Renal Stone Study  Result Date: 10/11/2019 CLINICAL DATA:  Hematuria and flank pain. EXAM: CT ABDOMEN AND PELVIS WITHOUT CONTRAST TECHNIQUE: Multidetector CT imaging of the abdomen and pelvis was performed following the standard protocol without IV  contrast. COMPARISON:  May 23, 2018 FINDINGS: Lower chest: Mild dependent atelectasis of posterior lung bases are noted. The heart size is enlarged. Hepatobiliary: No focal liver abnormality is seen. No gallstones, gallbladder wall thickening, or biliary dilatation. Pancreas: Unremarkable. No pancreatic ductal dilatation or surrounding inflammatory changes. Spleen: No focal or acute abnormality identified. Adrenals/Urinary Tract: The bilateral adrenal glands are normal. The kidneys are normal. There are no kidney stones. No hydronephrosis is identified bilaterally. The bladder is normal. Stomach/Bowel: The stomach is normal. There is no small bowel obstruction. There is thick wall sigmoid colon. No inflammatory changes identified surrounding the colon. The appendix is not seen. Vascular/Lymphatic: Aortic atherosclerosis. No enlarged abdominal or pelvic lymph nodes. Reproductive: Status post hysterectomy. No adnexal masses. Other: None. Musculoskeletal: Degenerative joint changes of the spine are noted. IMPRESSION: Thick walled sigmoid colon. The findings are nonspecific. Favor infectious/inflammatory etiology. No bowel obstruction. No nephrolithiasis or hydronephrosis identified bilaterally. Electronically Signed   By: Abelardo Diesel M.D.   On: 10/11/2019 12:51    My personal review of EKG: Rhythm NSR   Assessment & Plan:    Active Problems:   * No active hospital problems. *   1. Syncope-secondary to orthostatic hypotension, will monitor patient on telemetry.  Obtain echocardiogram, amlodipine has been discontinued.  Consider TED hose before discharge.  Will obtain serial troponin.  2. ?  Stroke-seen on CT head, discussed with neurologist Dr. Leonel Ramsay, he recommends getting an MRI before initiating stroke work-up.  MRI was obtained and is negative for stroke.  3. Hypokalemia-potassium was 3.1, will replace potassium and check BMP in a.m.  4. Hypertension-blood pressure is low as above, hold  amlodipine for now.    5. History of seizures-continue Lamictal.  6. Schizoaffective disorder-continue Lamictal, Seroquel   DVT Prophylaxis-   Lovenox   AM Labs Ordered, also please review Full Orders  Family Communication: Admission, patients condition and plan of care including tests being ordered have been discussed with the patient and her daughter at bedside who indicate understanding and agree with the plan and Code Status.  Code Status: Full code  Admission status: Inpatient: Based on patients clinical presentation and evaluation of above clinical data, I have made determination that patient meets Inpatient criteria at this time.  Time spent in minutes : 60 minutes   Oswald Hillock M.D on 10/11/2019 at 3:15 PM

## 2019-10-11 NOTE — ED Notes (Signed)
PATIENT UNABLE TO COMPLETE ORTHOSTATICS DUE TO INABILITY TO STAND WITH OUT FALLING BACK IN TO BED.

## 2019-10-11 NOTE — ED Notes (Signed)
Patient transported to MRI 

## 2019-10-11 NOTE — ED Notes (Signed)
Family at bedside. 

## 2019-10-11 NOTE — ED Provider Notes (Signed)
[facility] DEPT Provider Note   CSN: [id number] Arrival date & time: 10/11/19  [id number]     History   Chief Complaint Chief Complaint  Patient presents with  . Fatigue    HPI Melissa Bridges is a 58 y.o. female hx of anxiety, COPD, hypertension, here with dizziness.  Patient has been lightheaded and dizzy and states that she has been passing out for the last month or so.  Patient states that she also has some left arm numbness that is been going on for several weeks as well.  Patient also has some epigastric pain as well.  Patient states that she did drink some beer yesterday.  She denies any other drug use. She is on blood pressure medicine and did take her dose this morning .  Denies any fevers or chills.  Denies any shortness of breath. Denies any Covid contacts.     The history is provided by the patient.    Past Medical History:  Diagnosis Date  . Allergy   . Alopecia   . Anxiety   . Arthritis   . Asthma   . Cataract    "mild"  . COPD (chronic obstructive pulmonary disease) ([place])   . Depression   . GERD (gastroesophageal reflux disease)    o cc- takes OTC if needed.  . Glaucoma   . Hepatitis C   . Hypertension    onset age 50.  . Iron deficiency anemia   . Migraine   . Schizoaffective disorder ([place])   . Sciatic pain   . Seizures ([place])    onset in childhood. 06-03-16- per pt 8 months agoGeneralized tonic-clonic.Last seizure couple of months ago- last sz 9-10 months ago per pt 10-12-2016  . Shortness of breath dyspnea   . Substance abuse ([place])   . Ulcer     Patient Active Problem List   Diagnosis Date Noted  . Screening examination for STD (sexually transmitted disease) 06/20/2019  . Vaginal discharge 06/20/2019  . Dysuria 06/20/2019  . Bacterial vaginosis 06/20/2019  . Hematuria of unknown cause 06/20/2019  . Acute non-recurrent sinusitis 03/28/2019  . Overdose   . Melena   . Suicidal ideation 07/09/2018  . Anemia 07/09/2018   . Cocaine abuse ([place]) 05/24/2018  . Hypomagnesemia 05/24/2018  . Chronic left hip pain 05/24/2018  . Hypokalemia 05/23/2018  . Tylenol overdose 05/23/2018  . Abdominal pain 05/23/2018  . COPD (chronic obstructive pulmonary disease) ([place]) 05/23/2018  . Serrated adenoma of colon 08/05/2016  . Localization-related idiopathic epilepsy and epileptic syndromes with seizures of localized onset, not intractable, without status epilepticus ([place]) 10/16/2015  . 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 Seizure disorder ([place]) 09/09/2014  . Asthma with acute exacerbation 03/09/2013  . Hypertension 03/09/2013  . Lower back pain 03/09/2013  . Chronic hepatitis C without hepatic coma ([place]) 03/08/2013  . Smoker 12/12/2012  . Schizoaffective disorder, bipolar type ([place]) 12/12/2012    Past Surgical History:  Procedure Laterality Date  . ABDOMINAL HYSTERECTOMY     ovarian cyst B, cervical dysplasia, fibroids.   Ovaries intact.  [name] BIOPSY  07/11/2018   Procedure: BIOPSY;  Surgeon: Yetta Flock, MD;  Location: [facility];  Service: Gastroenterology;;  . COLONOSCOPY    . COLONOSCOPY W/ BIOPSIES    . DILATION AND CURETTAGE OF UTERUS     x2  . ESOPHAGOGASTRODUODENOSCOPY (EGD) WITH PROPOFOL N/A 07/11/2018   Procedure: ESOPHAGOGASTRODUODENOSCOPY (EGD) WITH PROPOFOL;  Surgeon: Yetta Flock, MD;  Location: [facility];  Service:  Gastroenterology;  Laterality: N/A;  . EXPLORATORY LAPAROTOMY     x3  . GYNECOLOGIC CRYOSURGERY     x3  . HOT HEMOSTASIS N/A 07/11/2018   Procedure: HOT HEMOSTASIS (ARGON PLASMA COAGULATION/BICAP);  Surgeon: Yetta Flock, MD;  Location: [facility];  Service: Gastroenterology;  Laterality: N/A;  . MANDIBLE RECONSTRUCTION N/A 06/27/2015   Procedure: REMOVAL MAXILLARY PALATAL TORUS.  REMOVAL MANDIBULAR TORUS AND EXOSTOSIS.  ;  Surgeon: Diona Browner, DDS;  Location: [facility];  Service: Oral Surgery;  Laterality: N/A;  . OVARIAN CYST REMOVAL  2008  . POLYPECTOMY        OB History   No obstetric history on file.      Home Medications    Prior to Admission medications   Medication Sig Start Date End Date Taking? Authorizing Provider  albuterol (PROAIR HFA) 108 (90 Base) MCG/ACT inhaler INHALE 2 PUFFS BY MOUTH EVERY 6 HOURS AS NEEDED FOR SHORTNESS OF BREATH 06/20/19   Rutherford Guys, MD  amLODipine (NORVASC) 5 MG tablet Take 1 tablet (5 mg total) by mouth daily. 09/05/19   Forrest Moron, MD  budesonide-formoterol (SYMBICORT) 160-4.5 MCG/ACT inhaler Inhale 2 puffs into the lungs 2 (two) times daily. 03/28/19   Corum, Rex Kras, MD  citalopram (CELEXA) 20 MG tablet Take 1 tablet (20 mg total) by mouth daily. 08/29/19   Forrest Moron, MD  diclofenac sodium (VOLTAREN) 1 % GEL Apply 2 g topically 4 (four) times daily. 05/24/18   Debbe Odea, MD  diphenhydrAMINE-APAP, sleep, (TYLENOL PM EXTRA STRENGTH PO) Take 500 mg by mouth once.     [provider]  ferrous sulfate 325 (65 FE) MG tablet Take 1 tablet (325 mg total) by mouth 2 (two) times daily with a meal. 07/05/18   Wardell Honour, MD  folic acid (FOLVITE) 1 MG tablet Take 1 tablet (1 mg total) by mouth daily. 07/16/18   Roxan Hockey, MD  gabapentin (NEURONTIN) 400 MG capsule Take 1 capsule (400 mg total) by mouth 3 (three) times daily. 05/21/19   Forrest Moron, MD  ipratropium-albuterol (DUONEB) 0.5-2.5 (3) MG/3ML SOLN Take 3 mLs by nebulization every 6 (six) hours as needed (shortness of breath). Reported on 12/02/2015 03/28/19   Maryruth Hancock, MD  lamoTRIgine (LAMICTAL) 25 MG tablet Take 2 tablets (50 mg total) by mouth daily. 06/25/19   Cameron Sprang, MD  methocarbamol (ROBAXIN) 750 MG tablet Take 1 tablet (750 mg total) by mouth 3 (three) times daily. Patient not taking: Reported on 09/28/2019 08/29/19   Forrest Moron, MD  metroNIDAZOLE (FLAGYL) 500 MG tablet Take 1 tablet (500 mg total) by mouth 2 (two) times daily. 09/06/19   Forrest Moron, MD  Multiple Vitamin (MULTIVITAMIN WITH  MINERALS) TABS tablet Take 1 tablet by mouth daily. 07/16/18   Emokpae, Courage, MD  ondansetron (ZOFRAN) 4 MG tablet TAKE 1 TABLET BY MOUTH EVERY 6 HOURS AS NEEDED FOR NAUSEA 08/27/19   Stallings, Zoe A, MD  pantoprazole (PROTONIX) 40 MG tablet TAKE 1 TABLET BY MOUTH TWICE DAILY BEFORE A MEAL 07/13/19   Stallings, Zoe A, MD  polyethylene glycol (MIRALAX) packet Take 17 g by mouth daily. 07/15/18   Roxan Hockey, MD  QUEtiapine (SEROQUEL) 50 MG tablet Take 3 tablets (150 mg total) by mouth at bedtime. 07/15/18   Roxan Hockey, MD  Respiratory Therapy Supplies (NEBULIZER AIR TUBE/PLUGS) MISC Dispense air tubes/plugs for replacement for nebulizer machine 09/28/19   Forrest Moron, MD  senna-docusate (SENOKOT-S) 8.6-50  MG tablet Take 2 tablets by mouth at bedtime. 02/05/19   Rutherford Guys, MD  thiamine 100 MG tablet Take 1 tablet (100 mg total) by mouth daily. 02/05/19   Rutherford Guys, MD  tiZANidine (ZANAFLEX) 4 MG tablet Take one tablet daily 05/21/19   Delia Chimes A, MD  triamcinolone cream (KENALOG) 0.1 % APPLY CREAM EXTERNALLY TWICE DAILY 10/03/19   Forrest Moron, MD  valACYclovir (VALTREX) 1000 MG tablet TAKE 2 TABLETS BY MOUTH AT SYMPTOM ONSET, REPEAT IN 12 HOURS 10/03/19   Rutherford Guys, MD    Family History Family History  Problem Relation Age of Onset  . Diabetes type II Mother   . Hypertension Mother   . Arthritis Mother   . Diabetes Mother   . Mental illness Mother        bipolar  . Diabetes type II Maternal Aunt   . Hypertension Father   . Heart murmur Father   . Arthritis Father   . Stroke Father   . Alopecia Sister   . Alopecia Brother   . Alopecia Sister   . Mental retardation Sister        depression  . Prostate cancer Paternal Grandfather   . Cancer Maternal Uncle   . Colon cancer Neg Hx   . Esophageal cancer Neg Hx   . Rectal cancer Neg Hx   . Stomach cancer Neg Hx   . Colon polyps Neg Hx     Social History Social History   Tobacco Use  .  Smoking status: Current Every Day Smoker    Packs/day: 0.33    Years: 44.00    Pack years: 14.52    Types: Cigarettes  . Smokeless tobacco: Never Used  Substance Use Topics  . Alcohol use: Yes    Alcohol/week: 0.0 standard drinks    Comment: 1 pakj every 3 days  . Drug use: Yes    Types: Marijuana, Cocaine     Allergies   Fruit & vegetable daily [nutritional supplements], Iodinated diagnostic agents, and Aloe vera   Review of Systems Review of Systems  Gastrointestinal: Positive for abdominal pain.  Neurological: Positive for dizziness and numbness.  All other systems reviewed and are negative.    Physical Exam Updated Vital Signs BP (!) 90/41   Pulse 73   Temp 97.9 F (36.6 C) (Oral)   Resp 15   SpO2 94%   Physical Exam Vitals signs and nursing note reviewed.  HENT:     Head: Normocephalic.     Nose: Nose normal.     Mouth/Throat:     Mouth: Mucous membranes are moist.  Eyes:     Extraocular Movements: Extraocular movements intact.     Pupils: Pupils are equal, round, and reactive to light.  Neck:     Musculoskeletal: Normal range of motion.  Cardiovascular:     Rate and Rhythm: Normal rate and regular rhythm.     Pulses: Normal pulses.     Heart sounds: Normal heart sounds.  Pulmonary:     Effort: Pulmonary effort is normal.     Breath sounds: Normal breath sounds.  Abdominal:     General: Abdomen is flat.     Comments: Mild epigastric tenderness, no obvious pulsatile mass   Musculoskeletal:     Comments: Good peripheral pulses.   Skin:    General: Skin is warm.     Capillary Refill: Capillary refill takes less than 2 seconds.  Neurological:     General: No  focal deficit present.     Mental Status: She is alert and oriented to person, place, and time.     Comments: CN 2- 12 intact, nl strength and sensation throughout, nl finger to nose bilaterally   Psychiatric:        Mood and Affect: Mood normal.      ED Treatments / Results  Labs (all  labs ordered are listed, but only abnormal results are displayed) Labs Reviewed  CBC WITH DIFFERENTIAL/PLATELET - Abnormal; Notable for the following components:      Result Value   RBC 3.56 (*)    MCV 108.7 (*)    MCH 37.1 (*)    All other components within normal limits  SARS CORONAVIRUS 2 BY RT PCR (HOSPITAL ORDER, [facility])  D-DIMER, QUANTITATIVE (NOT AT [facility])  COMPREHENSIVE METABOLIC PANEL  URINALYSIS, ROUTINE W REFLEX MICROSCOPIC  ETHANOL  ACETAMINOPHEN LEVEL  SALICYLATE LEVEL  RAPID URINE DRUG SCREEN, HOSP PERFORMED  LIPASE, BLOOD  TROPONIN I (HIGH SENSITIVITY)    EKG EKG Interpretation  Date/Time:  Thursday October 11 2019 10:10:18 EDT Ventricular Rate:  71 PR Interval:    QRS Duration: 96 QT Interval:  441 QTC Calculation: 480 R Axis:   -13 Text Interpretation: Sinus rhythm LAE, consider biatrial enlargement RSR' in V1 or V2, right VCD or RVH Baseline wander in lead(s) I aVR No significant change since last tracing Confirmed by Wandra Arthurs [phone]) on 10/11/2019 10:17:01 AM   Radiology Dg Chest Port 1 View  Result Date: 10/11/2019 CLINICAL DATA:  Shortness of breath. EXAM: PORTABLE CHEST 1 VIEW COMPARISON:  01/27/2015 FINDINGS: Cardiac silhouette is within normal limits considering the AP portable technique. Pulmonary vascularity is normal. Lungs are clear. Aortic atherosclerosis. No acute bone abnormality. Prominent osteophyte in the region of the right sixth costovertebral joint, unchanged. IMPRESSION: 1. No acute abnormalities. 2. Aortic atherosclerosis. Electronically Signed   By: Lorriane Shire M.D.   On: 10/11/2019 10:15    Procedures Procedures (including critical care time)  CRITICAL CARE Performed by: Wandra Arthurs   Total critical care time: 30 minutes  Critical care time was exclusive of separately billable procedures and treating other patients.  Critical care was necessary to treat or prevent imminent or  life-threatening deterioration.  Critical care was time spent personally by me on the following activities: development of treatment plan with patient and/or surrogate as well as nursing, discussions with consultants, evaluation of patient's response to treatment, examination of patient, obtaining history from patient or surrogate, ordering and performing treatments and interventions, ordering and review of laboratory studies, ordering and review of radiographic studies, pulse oximetry and re-evaluation of patient's condition.   Medications Ordered in ED Medications  sodium chloride 0.9 % bolus 1,000 mL (1,000 mLs Intravenous New Bag/Given 10/11/19 0955)     Initial Impression / Assessment and Plan / ED Course  I have reviewed the triage vital signs and the nursing notes.  Pertinent labs & imaging results that were available during my care of the patient were reviewed by me and considered in my medical decision making (see chart for details).        Aseret Schweinsberg is a 58 y.o. female here with hypotension, dizziness, L arm numbness.  Dizziness is likely secondary to hypotension .  The etiology of her hypotension is unclear currently. May be secondary to hypovolemia versus acute renal failure versus infection versus PE versus ruptured AAA.  Patient has good peripheral pulses and unfortunately does  have contrast dye allergy.  We will do a broad work-up with labs and chest x-ray and urinalysis.  We will also get D-dimer as well as CT ab/pel with no IV contrast.   1:30 PM CT head showed possible subacute R cerebellar infarct. Labs unremarkable. CT ab/pel and UA unremarkable. BP up to low 100s after 2 L NS bolus. However, still unable to even sit up. Consider posterior circulation stroke vs severe orthostatic hypotension. Will admit for MRI, further workup    Final Clinical Impressions(s) / ED Diagnoses   Final diagnoses:  None    ED Discharge Orders    None       Drenda Freeze,  MD 10/11/19 1331

## 2019-10-11 NOTE — ED Triage Notes (Signed)
Pt reports that her daughter made her come for being weak nad passing out. States been going on "for a while". Reports has happened before and had to change her HTN medications. Didn't take it today, last took yesterday. Reports drank some beers last night.

## 2019-10-12 ENCOUNTER — Observation Stay (HOSPITAL_BASED_OUTPATIENT_CLINIC_OR_DEPARTMENT_OTHER): Payer: 59

## 2019-10-12 DIAGNOSIS — I361 Nonrheumatic tricuspid (valve) insufficiency: Secondary | ICD-10-CM

## 2019-10-12 DIAGNOSIS — I951 Orthostatic hypotension: Secondary | ICD-10-CM | POA: Diagnosis not present

## 2019-10-12 DIAGNOSIS — I340 Nonrheumatic mitral (valve) insufficiency: Secondary | ICD-10-CM

## 2019-10-12 LAB — TROPONIN I (HIGH SENSITIVITY): Troponin I (High Sensitivity): 3 ng/L (ref <18)

## 2019-10-12 LAB — COMPREHENSIVE METABOLIC PANEL
ALT: 49 U/L — ABNORMAL HIGH (ref 0–44)
AST: 70 U/L — ABNORMAL HIGH (ref 15–41)
Albumin: 3.8 g/dL (ref 3.5–5.0)
Alkaline Phosphatase: 87 U/L (ref 38–126)
Anion gap: 9 (ref 5–15)
BUN: 6 mg/dL (ref 6–20)
CO2: 24 mmol/L (ref 22–32)
Calcium: 9.5 mg/dL (ref 8.9–10.3)
Chloride: 106 mmol/L (ref 98–111)
Creatinine, Ser: 0.83 mg/dL (ref 0.44–1.00)
GFR calc Af Amer: >60 mL/min (ref >60)
GFR calc non Af Amer: >60 mL/min (ref >60)
Glucose, Bld: 128 mg/dL — ABNORMAL HIGH (ref 70–99)
Potassium: 4.2 mmol/L (ref 3.5–5.1)
Sodium: 139 mmol/L (ref 135–145)
Total Bilirubin: 0.5 mg/dL (ref 0.3–1.2)
Total Protein: 7.4 g/dL (ref 6.5–8.1)

## 2019-10-12 LAB — CBC
HCT: 39.9 % (ref 36.0–46.0)
Hemoglobin: 13.5 g/dL (ref 12.0–15.0)
MCH: 37 pg — ABNORMAL HIGH (ref 26.0–34.0)
MCHC: 33.8 g/dL (ref 30.0–36.0)
MCV: 109.3 fL — ABNORMAL HIGH (ref 80.0–100.0)
Platelets: 127 10*3/uL — ABNORMAL LOW (ref 150–400)
RBC: 3.65 MIL/uL — ABNORMAL LOW (ref 3.87–5.11)
RDW: 12.9 % (ref 11.5–15.5)
WBC: 6.2 10*3/uL (ref 4.0–10.5)
nRBC: 0 % (ref 0.0–0.2)

## 2019-10-12 LAB — ECHOCARDIOGRAM COMPLETE

## 2019-10-12 NOTE — Progress Notes (Signed)
AVS given to patient and explained at the bedside. Medications and follow up appointments have been explained with pt verbalizing understanding.  

## 2019-10-12 NOTE — Progress Notes (Signed)
  Echocardiogram 2D Echocardiogram has been performed.  Loreal Schuessler 10/12/2019, 9:30 AM

## 2019-10-12 NOTE — Discharge Summary (Signed)
Physician Discharge Summary  Melissa Bridges [id number] DOB: 30-Jun-1961 DOA: 10/11/2019  PCP: Forrest Moron, MD  Admit date: 10/11/2019 Discharge date: 10/12/2019  Admitted From: Home Disposition: Home  Recommendations for Outpatient Follow-up:  1. Follow up with PCP in 1-2 weeks 2. Please obtain BMP/CBC in one week 3. Please follow up on the following pending results:  Home Health: None  equipment/Devices: None Discharge Condition stable CODE STATUS full code Diet recommendation: Cardiac Brief/Interim Summary:58 y.o. female, with history of anxiety, schizoaffective disorder, COPD, hypertension who came to ED with complaints of dizziness.  Patient says that she has been dizzy and lightheaded for past month or so.  She also had left arm numbness that has been going on for several weeks.  She has history of seizures, but no recent seizures.  As per daughter at bedside patient has been having recurrent episodes of syncope.  Her blood pressure medication was adjusted recently. She denies chest pain, shortness of breath. Denies fever or dysuria Denies abdominal pain  In the ED patient was found to be orthostatic with systolic blood pressure dropping to 70s on standing.  She was given IV normal saline fluid boluses.  Blood pressure has improved 129/85   Discharge Diagnoses:  Active Problems:   Syncope   1  Syncope-secondary to orthostatic hypotension, patient was monitored on telemetry without any arrhythmias.  Patient was hypotensive in the ER.  She was given IV fluids.  She has history of hypertension and her blood pressure started trending up at that point Norvasc was restarted.  Patient had an echocardiogram done and results are pending.    2. ?  Stroke-seen on CT head, discussed with neurologist Dr. Leonel Ramsay, he recommends getting an MRI before initiating stroke work-up.  MRI was obtained and is negative for stroke.  3 Hypokalemia-potassium was 3.1, admission it was  repleted and it is 4.2 at the time of discharge  5. Hypertension-restarted Norvasc. 6. History of seizures-continue Lamictal.  6. Schizoaffective disorder-continue Lamictal, Seroquel   Estimated body mass index is 20.19 kg/m as calculated from the following:   Height as of 09/28/19: 5\' 3"  (1.6 m).   Weight as of 09/28/19: 51.7 kg.  Discharge Instructions   Allergies as of 10/12/2019      Reactions   Fruit & Vegetable Daily [nutritional Supplements] Shortness Of Breath   Aloe   Iodinated Diagnostic Agents Swelling   Swelling and itching of left side of her face only after CT SI injection(no steroid used)   Ibuprofen Other (See Comments)   Stomach upset; However, pt can take Meloxicam without incident and takes this at home.    Aloe Vera Rash      Medication List    STOP taking these medications   diclofenac sodium 1 % Gel Commonly known as: VOLTAREN   ferrous sulfate 325 (65 FE) MG tablet   folic acid 1 MG tablet Commonly known as: FOLVITE   methocarbamol 750 MG tablet Commonly known as: ROBAXIN   metroNIDAZOLE 500 MG tablet Commonly known as: Copy Tube/Plugs Misc   pantoprazole 40 MG tablet Commonly known as: PROTONIX   polyethylene glycol 17 g packet Commonly known as: MiraLax   senna-docusate 8.6-50 MG tablet Commonly known as: Senokot-S   Tylenol 325 MG tablet Generic drug: acetaminophen     TAKE these medications   albuterol 108 (90 Base) MCG/ACT inhaler Commonly known as: ProAir HFA INHALE 2 PUFFS BY MOUTH EVERY 6 HOURS AS NEEDED FOR SHORTNESS OF BREATH  What changed:   how much to take  how to take this  when to take this  reasons to take this  additional instructions   amLODipine 5 MG tablet Commonly known as: NORVASC Take 1 tablet (5 mg total) by mouth daily.   budesonide-formoterol 160-4.5 MCG/ACT inhaler Commonly known as: SYMBICORT Inhale 2 puffs into the lungs 2 (two) times daily.   citalopram 20 MG  tablet Commonly known as: CELEXA Take 1 tablet (20 mg total) by mouth daily.   gabapentin 400 MG capsule Commonly known as: NEURONTIN Take 1 capsule (400 mg total) by mouth 3 (three) times daily.   ipratropium-albuterol 0.5-2.5 (3) MG/3ML Soln Commonly known as: DUONEB Take 3 mLs by nebulization every 6 (six) hours as needed (shortness of breath). Reported on 12/02/2015   lamoTRIgine 25 MG tablet Commonly known as: LAMICTAL Take 2 tablets (50 mg total) by mouth daily.   multivitamin with minerals Tabs tablet Take 1 tablet by mouth daily.   ondansetron 4 MG tablet Commonly known as: ZOFRAN TAKE 1 TABLET BY MOUTH EVERY 6 HOURS AS NEEDED FOR NAUSEA   QUEtiapine 50 MG tablet Commonly known as: SEROQUEL Take 3 tablets (150 mg total) by mouth at bedtime.   thiamine 100 MG tablet Take 1 tablet (100 mg total) by mouth daily.   tiZANidine 4 MG tablet Commonly known as: Zanaflex Take one tablet daily   triamcinolone cream 0.1 % Commonly known as: KENALOG APPLY CREAM EXTERNALLY TWICE DAILY What changed: See the new instructions.   TYLENOL PM EXTRA STRENGTH PO Take 500 mg by mouth once.   valACYclovir 1000 MG tablet Commonly known as: VALTREX TAKE 2 TABLETS BY MOUTH AT SYMPTOM ONSET, REPEAT IN 12 HOURS What changed:   how much to take  how to take this  when to take this       Allergies  Allergen Reactions  . Fruit & Vegetable Daily [Nutritional Supplements] Shortness Of Breath    Aloe  . Iodinated Diagnostic Agents Swelling    Swelling and itching of left side of her face only after CT SI injection(no steroid used)  . Ibuprofen Other (See Comments)    Stomach upset; However, pt can take Meloxicam without incident and takes this at home.   Vanita Ingles Rash    Consultations: none  Procedures/Studies: Ct Head Wo Contrast  Result Date: 10/11/2019 CLINICAL DATA:  Unexplained dizziness, syncope EXAM: CT HEAD WITHOUT CONTRAST TECHNIQUE: Contiguous axial images  were obtained from the base of the skull through the vertex without intravenous contrast. COMPARISON:  May 23, 2018 FINDINGS: Brain: There is no acute intracranial hemorrhage or mass effect. Suspected new small wedge-shaped area of hypoattenuation in the right occipital lobe likely reflecting subacute or chronic infarction. The ventricles and sulci are within normal limits in size and configuration. There is no extra-axial collection. Vascular: No hyperdense vessel or unexpected calcification. Skull: The calvarium is unremarkable. Sinuses/Orbits: Partially imaged paranasal sinuses and orbits are unremarkable. Other: Imaged mastoid air cells are clear. 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 No acute intracranial hemorrhage or mass effect. Electronically Signed   By: Macy Mis M.D.   On: 10/11/2019 12:55   Mr Brain Wo Contrast  Result Date: 10/11/2019 CLINICAL DATA:  Focal neuro deficit greater than 6 hours. Suspect stroke. EXAM: MRI HEAD WITHOUT CONTRAST TECHNIQUE: Multiplanar, multiecho pulse sequences of the brain and surrounding structures were obtained without intravenous contrast. COMPARISON:  MRI head 10/29/2015 FINDINGS: Brain: Motion degraded study which is  incomplete. The patient became progressively agitated not able to hold still. Negative for acute infarct. Negative for hemorrhage or mass. Ventricle size normal. Vascular: Normal arterial flow voids Skull and upper cervical spine: Negative Sinuses/Orbits: Negative Other: None IMPRESSION: Limited study. Motion degraded study . The patient was not able to complete all sequences several which are markedly degraded by motion. No acute abnormality. Electronically Signed   By: Franchot Gallo M.D.   On: 10/11/2019 17:48   Dg Chest Port 1 View  Result Date: 10/11/2019 CLINICAL DATA:  Shortness of breath. EXAM: PORTABLE CHEST 1 VIEW COMPARISON:  01/27/2015 FINDINGS: Cardiac silhouette is within normal limits  considering the AP portable technique. Pulmonary vascularity is normal. Lungs are clear. Aortic atherosclerosis. No acute bone abnormality. Prominent osteophyte in the region of the right sixth costovertebral joint, unchanged. IMPRESSION: 1. No acute abnormalities. 2. Aortic atherosclerosis. Electronically Signed   By: Lorriane Shire M.D.   On: 10/11/2019 10:15   Ct Renal Stone Study  Result Date: 10/11/2019 CLINICAL DATA:  Hematuria and flank pain. EXAM: CT ABDOMEN AND PELVIS WITHOUT CONTRAST TECHNIQUE: Multidetector CT imaging of the abdomen and pelvis was performed following the standard protocol without IV contrast. COMPARISON:  May 23, 2018 FINDINGS: Lower chest: Mild dependent atelectasis of posterior lung bases are noted. The heart size is enlarged. Hepatobiliary: No focal liver abnormality is seen. No gallstones, gallbladder wall thickening, or biliary dilatation. Pancreas: Unremarkable. No pancreatic ductal dilatation or surrounding inflammatory changes. Spleen: No focal or acute abnormality identified. Adrenals/Urinary Tract: The bilateral adrenal glands are normal. The kidneys are normal. There are no kidney stones. No hydronephrosis is identified bilaterally. The bladder is normal. Stomach/Bowel: The stomach is normal. There is no small bowel obstruction. There is thick wall sigmoid colon. No inflammatory changes identified surrounding the colon. The appendix is not seen. Vascular/Lymphatic: Aortic atherosclerosis. No enlarged abdominal or pelvic lymph nodes. Reproductive: Status post hysterectomy. No adnexal masses. Other: None. Musculoskeletal: Degenerative joint changes of the spine are noted. IMPRESSION: Thick walled sigmoid colon. The findings are nonspecific. Favor infectious/inflammatory etiology. No bowel obstruction. No nephrolithiasis or hydronephrosis identified bilaterally. Electronically Signed   By: Abelardo Diesel M.D.   On: 10/11/2019 12:51    (Echo, Carotid, EGD, Colonoscopy,  ERCP)    Subjective: Resting in bed no new complaints  Discharge Exam: Vitals:   10/12/19 0551 10/12/19 0750  BP: (!) 150/88   Pulse:    Resp:    Temp:    SpO2:  95%   Vitals:   10/11/19 2120 10/12/19 0548 10/12/19 0551 10/12/19 0750  BP: (!) 174/96 (!) 146/20 (!) 150/88   Pulse: 90 89    Resp: 16 16    Temp: 98.1 F (36.7 C) 98 F (36.7 C)    TempSrc: Oral Oral    SpO2: 100% 100%  95%    General: Pt is alert, awake, not in acute distress Cardiovascular: RRR, S1/S2 +, no rubs, no gallops Respiratory: CTA bilaterally, no wheezing, no rhonchi Abdominal: Soft, NT, ND, bowel sounds + Extremities: no edema, no cyanosis    The results of significant diagnostics from this hospitalization (including imaging, microbiology, ancillary and laboratory) are listed below for reference.     Microbiology: Recent Results (from the past 240 hour(s))  SARS Coronavirus 2 by RT PCR (hospital order, performed in [facility] hospital lab) Nasopharyngeal Nasopharyngeal Swab     Status: None   Collection Time: 10/11/19  9:46 AM   Specimen: Nasopharyngeal Swab  Result Value Ref Range  Status   SARS Coronavirus 2 NEGATIVE NEGATIVE Final    Comment: (NOTE) If result is NEGATIVE SARS-CoV-2 target nucleic acids are NOT DETECTED. The SARS-CoV-2 RNA is generally detectable in upper and lower  respiratory specimens during the acute phase of infection. The lowest  concentration of SARS-CoV-2 viral copies this assay can detect is 250  copies / mL. A negative result does not preclude SARS-CoV-2 infection  and should not be used as the sole basis for treatment or other  patient management decisions.  A negative result may occur with  improper specimen collection / handling, submission of specimen other  than nasopharyngeal swab, presence of viral mutation(s) within the  areas targeted by this assay, and inadequate number of viral copies  (<250 copies / mL). A negative result must be combined with  clinical  observations, patient history, and epidemiological information. If result is POSITIVE SARS-CoV-2 target nucleic acids are DETECTED. The SARS-CoV-2 RNA is generally detectable in upper and lower  respiratory specimens dur ing the acute phase of infection.  Positive  results are indicative of active infection with SARS-CoV-2.  Clinical  correlation with patient history and other diagnostic information is  necessary to determine patient infection status.  Positive results do  not rule out bacterial infection or co-infection with other viruses. If result is PRESUMPTIVE POSTIVE SARS-CoV-2 nucleic acids MAY BE PRESENT.   A presumptive positive result was obtained on the submitted specimen  and confirmed on repeat testing.  While 2019 novel coronavirus  (SARS-CoV-2) nucleic acids may be present in the submitted sample  additional confirmatory testing may be necessary for epidemiological  and / or clinical management purposes  to differentiate between  SARS-CoV-2 and other Sarbecovirus currently known to infect humans.  If clinically indicated additional testing with an alternate test  methodology [phone]) is advised. The SARS-CoV-2 RNA is generally  detectable in upper and lower respiratory sp ecimens during the acute  phase of infection. The expected result is Negative. Fact Sheet for Patients:  StrictlyIdeas.no Fact Sheet for Healthcare Providers: BankingDealers.co.za This test is not yet approved or cleared by the [place] FDA and has been authorized for detection and/or diagnosis of SARS-CoV-2 by FDA under an Emergency Use Authorization (EUA).  This EUA will remain in effect (meaning this test can be used) for the duration of the COVID-19 declaration under Section 564(b)(1) of the Act, 21 U.S.C. section 360bbb-3(b)(1), unless the authorization is terminated or revoked sooner. Performed at [facility],  [place] [address] [place], [place] [address]      Labs: BNP (last 3 results) No results for input(s): BNP in the last 8760 hours. Basic Metabolic Panel: Recent Labs  Lab 10/11/19 0950 10/11/19 2138 10/12/19 0431  NA 137  --  139  K 3.1*  --  4.2  CL 102  --  106  CO2 26  --  24  GLUCOSE 134*  --  128*  BUN 6  --  6  CREATININE 1.04* 0.80 0.83  CALCIUM 9.5  --  9.5   Liver Function Tests: Recent Labs  Lab 10/11/19 0950 10/12/19 0431  AST 90* 70*  ALT 52* 49*  ALKPHOS 72 87  BILITOT 0.9 0.5  PROT 7.5 7.4  ALBUMIN 3.9 3.8   Recent Labs  Lab 10/11/19 0950  LIPASE 26   No results for input(s): AMMONIA in the last 168 hours. CBC: Recent Labs  Lab 10/11/19 0950 10/11/19 2138 10/12/19 0431  WBC 6.8 7.8 6.2  NEUTROABS 4.8  --   --  HGB 13.2 13.3 13.5  HCT 38.7 38.8 39.9  MCV 108.7* 109.6* 109.3*  PLT 175 162 127*   Cardiac Enzymes: No results for input(s): CKTOTAL, CKMB, CKMBINDEX, TROPONINI in the last 168 hours. BNP: Invalid input(s): POCBNP CBG: No results for input(s): GLUCAP in the last 168 hours. D-Dimer Recent Labs    10/11/19 0950  DDIMER 0.32   Hgb A1c No results for input(s): HGBA1C in the last 72 hours. Lipid Profile No results for input(s): CHOL, HDL, LDLCALC, TRIG, CHOLHDL, LDLDIRECT in the last 72 hours. Thyroid function studies No results for input(s): TSH, T4TOTAL, T3FREE, THYROIDAB in the last 72 hours.  Invalid input(s): FREET3 Anemia work up No results for input(s): VITAMINB12, FOLATE, FERRITIN, TIBC, IRON, RETICCTPCT in the last 72 hours. Urinalysis    Component Value Date/Time   COLORURINE YELLOW 10/11/2019 1253   APPEARANCEUR HAZY (A) 10/11/2019 1253   LABSPEC 1.008 10/11/2019 1253   PHURINE 7.0 10/11/2019 1253   GLUCOSEU NEGATIVE 10/11/2019 1253   HGBUR NEGATIVE 10/11/2019 [place] 10/11/2019 1253   BILIRUBINUR negative 06/20/2019 0953   BILIRUBINUR neg 06/10/2015 1154   KETONESUR NEGATIVE  10/11/2019 1253   PROTEINUR NEGATIVE 10/11/2019 1253   UROBILINOGEN 0.2 06/20/2019 0953   UROBILINOGEN 0.2 08/25/2015 2049   NITRITE NEGATIVE 10/11/2019 1253   LEUKOCYTESUR NEGATIVE 10/11/2019 1253   Sepsis Labs Invalid input(s): PROCALCITONIN,  WBC,  LACTICIDVEN Microbiology Recent Results (from the past 240 hour(s))  SARS Coronavirus 2 by RT PCR (hospital order, performed in [place] hospital lab) Nasopharyngeal Nasopharyngeal Swab     Status: None   Collection Time: 10/11/19  9:46 AM   Specimen: Nasopharyngeal Swab  Result Value Ref Range Status   SARS Coronavirus 2 NEGATIVE NEGATIVE Final    Comment: (NOTE) If result is NEGATIVE SARS-CoV-2 target nucleic acids are NOT DETECTED. The SARS-CoV-2 RNA is generally detectable in upper and lower  respiratory specimens during the acute phase of infection. The lowest  concentration of SARS-CoV-2 viral copies this assay can detect is 250  copies / mL. A negative result does not preclude SARS-CoV-2 infection  and should not be used as the sole basis for treatment or other  patient management decisions.  A negative result may occur with  improper specimen collection / handling, submission of specimen other  than nasopharyngeal swab, presence of viral mutation(s) within the  areas targeted by this assay, and inadequate number of viral copies  (<250 copies / mL). A negative result must be combined with clinical  observations, patient history, and epidemiological information. If result is POSITIVE SARS-CoV-2 target nucleic acids are DETECTED. The SARS-CoV-2 RNA is generally detectable in upper and lower  respiratory specimens dur ing the acute phase of infection.  Positive  results are indicative of active infection with SARS-CoV-2.  Clinical  correlation with patient history and other diagnostic information is  necessary to determine patient infection status.  Positive results do  not rule out bacterial infection or co-infection with  other viruses. If result is PRESUMPTIVE POSTIVE SARS-CoV-2 nucleic acids MAY BE PRESENT.   A presumptive positive result was obtained on the submitted specimen  and confirmed on repeat testing.  While 2019 novel coronavirus  (SARS-CoV-2) nucleic acids may be present in the submitted sample  additional confirmatory testing may be necessary for epidemiological  and / or clinical management purposes  to differentiate between  SARS-CoV-2 and other Sarbecovirus currently known to infect humans.  If clinically indicated additional testing with an alternate test  methodology [phone]) is advised. The SARS-CoV-2 RNA is generally  detectable in upper and lower respiratory sp ecimens during the acute  phase of infection. The expected result is Negative. Fact Sheet for Patients:  StrictlyIdeas.no Fact Sheet for Healthcare Providers: BankingDealers.co.za This test is not yet approved or cleared by the [place] FDA and has been authorized for detection and/or diagnosis of SARS-CoV-2 by FDA under an Emergency Use Authorization (EUA).  This EUA will remain in effect (meaning this test can be used) for the duration of the COVID-19 declaration under Section 564(b)(1) of the Act, 21 U.S.C. section 360bbb-3(b)(1), unless the authorization is terminated or revoked sooner. Performed at [facility], [place] [address], [place], [place] [address]      Time coordinating discharge: 36 minutes  SIGNED:   Georgette Shell, MD  [facility] 10/12/2019, 11:03 AM Pager   If 7PM-7AM, please contact night-coverage [email or web address] Password [email or web address]

## 2019-10-22 ENCOUNTER — Ambulatory Visit (INDEPENDENT_AMBULATORY_CARE_PROVIDER_SITE_OTHER): Payer: 59 | Admitting: Family Medicine

## 2019-10-22 ENCOUNTER — Other Ambulatory Visit: Payer: Self-pay

## 2019-10-22 ENCOUNTER — Encounter: Payer: Self-pay | Admitting: Family Medicine

## 2019-10-22 VITALS — BP 118/64 | HR 89 | Temp 98.9°F | Ht 63.4 in | Wt 121.0 lb

## 2019-10-22 DIAGNOSIS — G40909 Epilepsy, unspecified, not intractable, without status epilepticus: Secondary | ICD-10-CM | POA: Diagnosis not present

## 2019-10-22 DIAGNOSIS — E876 Hypokalemia: Secondary | ICD-10-CM | POA: Diagnosis not present

## 2019-10-22 DIAGNOSIS — I10 Essential (primary) hypertension: Secondary | ICD-10-CM

## 2019-10-22 DIAGNOSIS — G8929 Other chronic pain: Secondary | ICD-10-CM

## 2019-10-22 DIAGNOSIS — D7589 Other specified diseases of blood and blood-forming organs: Secondary | ICD-10-CM

## 2019-10-22 DIAGNOSIS — R55 Syncope and collapse: Secondary | ICD-10-CM | POA: Diagnosis not present

## 2019-10-22 DIAGNOSIS — M545 Low back pain, unspecified: Secondary | ICD-10-CM

## 2019-10-22 DIAGNOSIS — I951 Orthostatic hypotension: Secondary | ICD-10-CM | POA: Diagnosis not present

## 2019-10-22 MED ORDER — BLOOD PRESSURE MONITOR KIT: PACK | 0 refills | Status: AC

## 2019-10-22 NOTE — Patient Instructions (Addendum)
If your blood pressure is 90/60 or less then take a half dose of medication If your blood pressure is greater than 150/90 then take two of the amlodipine If your blood pressure is 180/100, sit for 20 minutes and recheck it and if it is still high then call the office  Return to clinic in 2 months with your bp cuff and readings.   Orthostatic Hypotension Blood pressure is a measurement of how strongly, or weakly, your blood is pressing against the walls of your arteries. Orthostatic hypotension is a sudden drop in blood pressure that happens when you quickly change positions, such as when you get up from sitting or lying down. Arteries are blood vessels that carry blood from your heart throughout your body. When blood pressure is too low, you may not get enough blood to your brain or to the rest of your organs. This can cause weakness, light-headedness, rapid heartbeat, and fainting. This can last for just a few seconds or for up to a few minutes. Orthostatic hypotension is usually not a serious problem. However, if it happens frequently or gets worse, it may be a sign of something more serious. What are the causes? This condition may be caused by:  Sudden changes in posture, such as standing up quickly after you have been sitting or lying down.  Blood loss.  Loss of body fluids (dehydration).  Heart problems.  Hormone (endocrine) problems.  Pregnancy.  Severe infection.  Lack of certain nutrients.  Severe allergic reactions (anaphylaxis).  Certain medicines, such as blood pressure medicine or medicines that make the body lose excess fluids (diuretics). Sometimes, this condition can be caused by not taking medicine as directed, such as taking too much of a certain medicine. What increases the risk? The following factors may make you more likely to develop this condition:  Age. Risk increases as you get older.  Conditions that affect the heart or the central nervous system.   Taking certain medicines, such as blood pressure medicine or diuretics.  Being pregnant. What are the signs or symptoms? Symptoms of this condition may include:  Weakness.  Light-headedness.  Dizziness.  Blurred vision.  Fatigue.  Rapid heartbeat.  Fainting, in severe cases. How is this diagnosed? This condition is diagnosed based on:  Your medical history.  Your symptoms.  Your blood pressure measurement. Your health care provider will check your blood pressure when you are: ? Lying down. ? Sitting. ? Standing. A blood pressure reading is recorded as two numbers, such as "120 over 80" (or 120/80). The first ("top") number is called the systolic pressure. It is a measure of the pressure in your arteries as your heart beats. The second ("bottom") number is called the diastolic pressure. It is a measure of the pressure in your arteries when your heart relaxes between beats. Blood pressure is measured in a unit called mm Hg. Healthy blood pressure for most adults is 120/80. If your blood pressure is below 90/60, you may be diagnosed with hypotension. Other information or tests that may be used to diagnose orthostatic hypotension include:  Your other vital signs, such as your heart rate and temperature.  Blood tests.  Tilt table test. For this test, you will be safely secured to a table that moves you from a lying position to an upright position. Your heart rhythm and blood pressure will be monitored during the test. How is this treated? This condition may be treated by:  Changing your diet. This may involve eating more  salt (sodium) or drinking more water.  Taking medicines to raise your blood pressure.  Changing the dosage of certain medicines you are taking that might be lowering your blood pressure.  Wearing compression stockings. These stockings help to prevent blood clots and reduce swelling in your legs. In some cases, you may need to go to the hospital for:   Fluid replacement. This means you will receive fluids through an IV.  Blood replacement. This means you will receive donated blood through an IV (transfusion).  Treating an infection or heart problems, if this applies.  Monitoring. You may need to be monitored while medicines that you are taking wear off. Follow these instructions at home: Eating and drinking   Drink enough fluid to keep your urine pale yellow.  Eat a healthy diet, and follow instructions from your health care provider about eating or drinking restrictions. A healthy diet includes: ? Fresh fruits and vegetables. ? Whole grains. ? Lean meats. ? Low-fat dairy products.  Eat extra salt only as directed. Do not add extra salt to your diet unless your health care provider told you to do that.  Eat frequent, small meals.  Avoid standing up suddenly after eating. Medicines  Take over-the-counter and prescription medicines only as told by your health care provider. ? Follow instructions from your health care provider about changing the dosage of your current medicines, if this applies. ? Do not stop or adjust any of your medicines on your own. General instructions   Wear compression stockings as told by your health care provider.  Get up slowly from lying down or sitting positions. This gives your blood pressure a chance to adjust.  Avoid hot showers and excessive heat as directed by your health care provider.  Return to your normal activities as told by your health care provider. Ask your health care provider what activities are safe for you.  Do not use any products that contain nicotine or tobacco, such as cigarettes, e-cigarettes, and chewing tobacco. If you need help quitting, ask your health care provider.  Keep all follow-up visits as told by your health care provider. This is important. Contact a health care provider if you:  Vomit.  Have diarrhea.  Have a fever for more than 2-3 days.  Feel more  thirsty than usual.  Feel weak and tired. Get help right away if you:  Have chest pain.  Have a fast or irregular heartbeat.  Develop numbness in any part of your body.  Cannot move your arms or your legs.  Have trouble speaking.  Become sweaty or feel light-headed.  Faint.  Feel short of breath.  Have trouble staying awake.  Feel confused. Summary  Orthostatic hypotension is a sudden drop in blood pressure that happens when you quickly change positions.  Orthostatic hypotension is usually not a serious problem.  It is diagnosed by having your blood pressure taken lying down, sitting, and then standing.  It may be treated by changing your diet or adjusting your medicines. This information is not intended to replace advice given to you by your health care provider. Make sure you discuss any questions you have with your health care provider. Document Released: 11/19/2002 Document Revised: 05/25/2018 Document Reviewed: 05/25/2018 Elsevier Patient Education  2020 [employer].
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
## 2019-10-23 LAB — CMP14+EGFR
ALT: 60 IU/L — ABNORMAL HIGH (ref 0–32)
AST: 88 IU/L — ABNORMAL HIGH (ref 0–40)
Albumin/Globulin Ratio: 1.5 (ref 1.2–2.2)
Albumin: 4.3 g/dL (ref 3.8–4.9)
Alkaline Phosphatase: 122 IU/L — ABNORMAL HIGH (ref 39–117)
BUN/Creatinine Ratio: 9 — ABNORMAL LOW (ref 12–28)
BUN: 8 mg/dL (ref 8–27)
Bilirubin Total: 0.3 mg/dL (ref 0.0–1.2)
CO2: 26 mmol/L (ref 20–29)
Calcium: 9.5 mg/dL (ref 8.7–10.3)
Chloride: 100 mmol/L (ref 96–106)
Creatinine, Ser: 0.94 mg/dL (ref 0.57–1.00)
GFR calc Af Amer: 76 mL/min/{1.73_m2} (ref >59)
GFR calc non Af Amer: 66 mL/min/{1.73_m2} (ref >59)
Globulin, Total: 2.9 g/dL (ref 1.5–4.5)
Glucose: 89 mg/dL (ref 65–99)
Potassium: 4.5 mmol/L (ref 3.5–5.2)
Sodium: 138 mmol/L (ref 134–144)
Total Protein: 7.2 g/dL (ref 6.0–8.5)

## 2019-10-23 LAB — VITAMIN B12: Vitamin B-12: 428 pg/mL (ref 232–1245)

## 2019-10-29 ENCOUNTER — Emergency Department (HOSPITAL_COMMUNITY)
Admission: EM | Admit: 2019-10-29 | Discharge: 2019-10-29 | Disposition: A | Discharge status: Home / Self Care | Payer: 59 | Attending: Emergency Medicine | Admitting: Emergency Medicine

## 2019-10-29 ENCOUNTER — Encounter: Payer: Self-pay | Admitting: Family Medicine

## 2019-10-29 ENCOUNTER — Ambulatory Visit: Payer: 59 | Admitting: Family Medicine

## 2019-10-29 ENCOUNTER — Encounter (HOSPITAL_COMMUNITY): Payer: Self-pay

## 2019-10-29 ENCOUNTER — Other Ambulatory Visit: Payer: Self-pay

## 2019-10-29 ENCOUNTER — Emergency Department (HOSPITAL_COMMUNITY): Payer: 59

## 2019-10-29 ENCOUNTER — Telehealth: Payer: Self-pay

## 2019-10-29 ENCOUNTER — Ambulatory Visit (INDEPENDENT_AMBULATORY_CARE_PROVIDER_SITE_OTHER): Payer: 59 | Admitting: Family Medicine

## 2019-10-29 VITALS — BP 82/44 | HR 69 | Temp 97.5°F | Ht 63.5 in | Wt 117.0 lb

## 2019-10-29 DIAGNOSIS — I10 Essential (primary) hypertension: Secondary | ICD-10-CM | POA: Diagnosis not present

## 2019-10-29 DIAGNOSIS — I959 Hypotension, unspecified: Secondary | ICD-10-CM | POA: Diagnosis present

## 2019-10-29 DIAGNOSIS — I951 Orthostatic hypotension: Secondary | ICD-10-CM

## 2019-10-29 DIAGNOSIS — Z79899 Other long term (current) drug therapy: Secondary | ICD-10-CM | POA: Insufficient documentation

## 2019-10-29 DIAGNOSIS — E860 Dehydration: Secondary | ICD-10-CM | POA: Insufficient documentation

## 2019-10-29 DIAGNOSIS — R55 Syncope and collapse: Secondary | ICD-10-CM | POA: Diagnosis not present

## 2019-10-29 DIAGNOSIS — J45909 Unspecified asthma, uncomplicated: Secondary | ICD-10-CM | POA: Insufficient documentation

## 2019-10-29 DIAGNOSIS — J449 Chronic obstructive pulmonary disease, unspecified: Secondary | ICD-10-CM | POA: Diagnosis not present

## 2019-10-29 DIAGNOSIS — R110 Nausea: Secondary | ICD-10-CM | POA: Diagnosis not present

## 2019-10-29 DIAGNOSIS — F17210 Nicotine dependence, cigarettes, uncomplicated: Secondary | ICD-10-CM | POA: Diagnosis not present

## 2019-10-29 LAB — COMPREHENSIVE METABOLIC PANEL
ALT: 55 U/L — ABNORMAL HIGH (ref 0–44)
AST: 62 U/L — ABNORMAL HIGH (ref 15–41)
Albumin: 3.4 g/dL — ABNORMAL LOW (ref 3.5–5.0)
Alkaline Phosphatase: 64 U/L (ref 38–126)
Anion gap: 5 (ref 5–15)
BUN: 9 mg/dL (ref 6–20)
CO2: 27 mmol/L (ref 22–32)
Calcium: 8.6 mg/dL — ABNORMAL LOW (ref 8.9–10.3)
Chloride: 98 mmol/L (ref 98–111)
Creatinine, Ser: 0.72 mg/dL (ref 0.44–1.00)
GFR calc Af Amer: >60 mL/min (ref >60)
GFR calc non Af Amer: >60 mL/min (ref >60)
Glucose, Bld: 90 mg/dL (ref 70–99)
Potassium: 3.5 mmol/L (ref 3.5–5.1)
Sodium: 130 mmol/L — ABNORMAL LOW (ref 135–145)
Total Bilirubin: 0.6 mg/dL (ref 0.3–1.2)
Total Protein: 6.5 g/dL (ref 6.5–8.1)

## 2019-10-29 LAB — CBC WITH DIFFERENTIAL/PLATELET
Abs Immature Granulocytes: 0.01 10*3/uL (ref 0.00–0.07)
Basophils Absolute: 0 10*3/uL (ref 0.0–0.1)
Basophils Relative: 0 %
Eosinophils Absolute: 0 10*3/uL (ref 0.0–0.5)
Eosinophils Relative: 1 %
HCT: 37 % (ref 36.0–46.0)
Hemoglobin: 13.3 g/dL (ref 12.0–15.0)
Immature Granulocytes: 0 %
Lymphocytes Relative: 18 %
Lymphs Abs: 1.1 10*3/uL (ref 0.7–4.0)
MCH: 37.8 pg — ABNORMAL HIGH (ref 26.0–34.0)
MCHC: 35.9 g/dL (ref 30.0–36.0)
MCV: 105.1 fL — ABNORMAL HIGH (ref 80.0–100.0)
Monocytes Absolute: 0.6 10*3/uL (ref 0.1–1.0)
Monocytes Relative: 10 %
Neutro Abs: 4.4 10*3/uL (ref 1.7–7.7)
Neutrophils Relative %: 71 %
Platelets: 238 10*3/uL (ref 150–400)
RBC: 3.52 MIL/uL — ABNORMAL LOW (ref 3.87–5.11)
RDW: 12.5 % (ref 11.5–15.5)
WBC: 6.2 10*3/uL (ref 4.0–10.5)
nRBC: 0 % (ref 0.0–0.2)

## 2019-10-29 LAB — URINALYSIS, ROUTINE W REFLEX MICROSCOPIC
Bilirubin Urine: NEGATIVE
Glucose, UA: NEGATIVE mg/dL
Hgb urine dipstick: NEGATIVE
Ketones, ur: NEGATIVE mg/dL
Leukocytes,Ua: NEGATIVE
Nitrite: NEGATIVE
Protein, ur: NEGATIVE mg/dL
Specific Gravity, Urine: 1.009 (ref 1.005–1.030)
pH: 7 (ref 5.0–8.0)

## 2019-10-29 MED ORDER — ACETAMINOPHEN 325 MG PO TABS
650.0000 mg | ORAL_TABLET | Freq: Once | ORAL | Status: AC
  Administered 2019-10-29: 650 mg via ORAL
  Filled 2019-10-29: qty 2

## 2019-10-29 MED ORDER — SODIUM CHLORIDE 0.9 % IV SOLN
1000.0000 mL | INTRAVENOUS | Status: DC
  Administered 2019-10-29: 1000 mL via INTRAVENOUS

## 2019-10-29 MED ORDER — SODIUM CHLORIDE 0.9 % IV BOLUS (SEPSIS)
1000.0000 mL | Freq: Once | INTRAVENOUS | Status: AC
  Administered 2019-10-29: 1000 mL via INTRAVENOUS

## 2019-10-29 NOTE — Progress Notes (Signed)
Established Patient Office Visit  Subjective:  Patient ID: Melissa Bridges, female    DOB: 1961/05/17  Age: 58 y.o. MRN: [id number]  CC:  Chief Complaint  Patient presents with  . low Bp  . Dizziness  . Loss of Consciousness    HPI Melissa Bridges presents for   Patient was feeling very dizzy and stayed in the bed yesterday.  She also had tinnitus. She states that she could not stand. She also feels very tired and her daughter reports that this morning she noticed that her speech was slurred She did not take her bp meds this morning and has not eaten due to  She had diarrhea 5 times overnight. She has been eating softer foods due to having her teeth pulled. She states that she has pain from the sciatica causing back pain. BP Readings from Last 3 Encounters:  10/29/19 (!) 82/44  10/22/19 118/64  10/12/19 (!) 150/88   She took her usual seizure meds In the past when she had this type of low blood pressure episode it would correct by mid morning She had to stay with her daughter because she could not stand up and needed help to move around She states that last night she fell and hit her tail bone which set off her low back pain.  Past Medical History:  Diagnosis Date  . Allergy   . Alopecia   . Anxiety   . Arthritis   . Asthma   . Cataract    "mild"  . COPD (chronic obstructive pulmonary disease) ([place])   . Depression   . GERD (gastroesophageal reflux disease)    o cc- takes OTC if needed.  . Glaucoma   . Hepatitis C   . Hypertension    onset age 38.  . Iron deficiency anemia   . Migraine   . Schizoaffective disorder ([place])   . Sciatic pain   . Seizures ([place])    onset in childhood. 06-03-16- per pt 8 months agoGeneralized tonic-clonic.Last seizure couple of months ago- last sz 9-10 months ago per pt 10-12-2016  . Shortness of breath dyspnea   . Substance abuse ([place])   . Ulcer     Past Surgical History:  Procedure Laterality Date  . ABDOMINAL HYSTERECTOMY     ovarian cyst B, cervical dysplasia, fibroids.   Ovaries intact.  [name] BIOPSY  07/11/2018   Procedure: BIOPSY;  Surgeon: Yetta Flock, MD;  Location: [facility];  Service: Gastroenterology;;  . COLONOSCOPY    . COLONOSCOPY W/ BIOPSIES    . DILATION AND CURETTAGE OF UTERUS     x2  . ESOPHAGOGASTRODUODENOSCOPY (EGD) WITH PROPOFOL N/A 07/11/2018   Procedure: ESOPHAGOGASTRODUODENOSCOPY (EGD) WITH PROPOFOL;  Surgeon: Yetta Flock, MD;  Location: [facility];  Service: Gastroenterology;  Laterality: N/A;  . EXPLORATORY LAPAROTOMY     x3  . GYNECOLOGIC CRYOSURGERY     x3  . HOT HEMOSTASIS N/A 07/11/2018   Procedure: HOT HEMOSTASIS (ARGON PLASMA COAGULATION/BICAP);  Surgeon: Yetta Flock, MD;  Location: [facility];  Service: Gastroenterology;  Laterality: N/A;  . MANDIBLE RECONSTRUCTION N/A 06/27/2015   Procedure: REMOVAL MAXILLARY PALATAL TORUS.  REMOVAL MANDIBULAR TORUS AND EXOSTOSIS.  ;  Surgeon: Diona Browner, DDS;  Location: [facility];  Service: Oral Surgery;  Laterality: N/A;  . OVARIAN CYST REMOVAL  2008  . POLYPECTOMY      Family History  Problem Relation Age of Onset  . Diabetes type II Mother   . Hypertension Mother   .  Arthritis Mother   . Diabetes Mother   . Mental illness Mother        bipolar  . Diabetes type II Maternal Aunt   . Hypertension Father   . Heart murmur Father   . Arthritis Father   . Stroke Father   . Alopecia Sister   . Alopecia Brother   . Alopecia Sister   . Mental retardation Sister        depression  . Prostate cancer Paternal Grandfather   . Cancer Maternal Uncle   . Colon cancer Neg Hx   . Esophageal cancer Neg Hx   . Rectal cancer Neg Hx   . Stomach cancer Neg Hx   . Colon polyps Neg Hx     Social History   Socioeconomic History  . Marital status: Widowed    Spouse name: Not on file  . Number of children: 1  . Years of education: Not on file  . Highest education level: Not on file  Occupational History  . Occupation:  disabled    Comment: mental illness; seizures  Social Needs  . Financial resource strain: Not on file  . Food insecurity    Worry: Not on file    Inability: Not on file  . Transportation needs    Medical: Not on file    Non-medical: Not on file  Tobacco Use  . Smoking status: Current Every Day Smoker    Packs/day: 0.33    Years: 44.00    Pack years: 14.52    Types: Cigarettes  . Smokeless tobacco: Never Used  Substance and Sexual Activity  . Alcohol use: Yes    Alcohol/week: 0.0 standard drinks    Comment: 1 pakj every 3 days  . Drug use: Yes    Types: Marijuana, Cocaine  . Sexual activity: Not Currently    Birth control/protection: None    Comment: widow  Lifestyle  . Physical activity    Days per week: Not on file    Minutes per session: Not on file  . Stress: Not on file  Relationships  . Social Herbalist on phone: Not on file    Gets together: Not on file    Attends religious service: Not on file    Active member of club or organization: Not on file    Attends meetings of clubs or organizations: Not on file    Relationship status: Not on file  . Intimate partner violence    Fear of current or ex partner: Not on file    Emotionally abused: Not on file    Physically abused: Not on file    Forced sexual activity: Not on file  Other Topics Concern  . Not on file  Social History Narrative   Marital status:  Widowed since 2002.  Married x 16 years. + dating.  Moved from [place] to live with daughter in 2013.     Children:  One child/daughter (33); two grandchildren.      Lives: with daughter, grandchildren 2.  Does not drive due to epilepsy.      Employment:  Disability for schizoaffective disorder.      Tobacco: 1 ppd x since 8th grade.      Alcohol:  Social; rare drinking due to seizure medications.  Weekends.       Drugs: none; previous use of marijuana.  Previous iv drug use, cocaine.      Exercise: none      Seatbelt:  100%  Guns: none     Outpatient Medications Prior to Visit  Medication Sig Dispense Refill  . albuterol (PROAIR HFA) 108 (90 Base) MCG/ACT inhaler INHALE 2 PUFFS BY MOUTH EVERY 6 HOURS AS NEEDED FOR SHORTNESS OF BREATH (Patient taking differently: Inhale 2 puffs into the lungs every 6 (six) hours as needed for shortness of breath. ) 8.5 g 1  . amLODipine (NORVASC) 5 MG tablet Take 1 tablet (5 mg total) by mouth daily. 90 tablet 3  . budesonide-formoterol (SYMBICORT) 160-4.5 MCG/ACT inhaler Inhale 2 puffs into the lungs 2 (two) times daily. 2 Inhaler 3  . citalopram (CELEXA) 20 MG tablet Take 1 tablet (20 mg total) by mouth daily. 90 tablet 0  . diphenhydrAMINE-APAP, sleep, (TYLENOL PM EXTRA STRENGTH PO) Take 500 mg by mouth once.     . gabapentin (NEURONTIN) 400 MG capsule Take 1 capsule (400 mg total) by mouth 3 (three) times daily. 270 capsule 1  . ipratropium-albuterol (DUONEB) 0.5-2.5 (3) MG/3ML SOLN Take 3 mLs by nebulization every 6 (six) hours as needed (shortness of breath). Reported on 12/02/2015 360 mL 1  . lamoTRIgine (LAMICTAL) 25 MG tablet Take 2 tablets (50 mg total) by mouth daily. 60 tablet 0  . Multiple Vitamin (MULTIVITAMIN WITH MINERALS) TABS tablet Take 1 tablet by mouth daily. 30 tablet 1  . ondansetron (ZOFRAN) 4 MG tablet TAKE 1 TABLET BY MOUTH EVERY 6 HOURS AS NEEDED FOR NAUSEA 20 tablet 1  . QUEtiapine (SEROQUEL) 50 MG tablet Take 3 tablets (150 mg total) by mouth at bedtime. 90 tablet 3  . tiZANidine (ZANAFLEX) 4 MG tablet Take one tablet daily 30 tablet 3  . triamcinolone cream (KENALOG) 0.1 % APPLY CREAM EXTERNALLY TWICE DAILY 30 g 0  . valACYclovir (VALTREX) 1000 MG tablet TAKE 2 TABLETS BY MOUTH AT SYMPTOM ONSET, REPEAT IN 12 HOURS (Patient taking differently: Take 2,000 mg by mouth See admin instructions. TAKE 2 TABLETS BY MOUTH AT SYMPTOM ONSET, REPEAT IN 12 HOURS) 20 tablet 2  . Blood Pressure Monitor KIT Dispense one blood pressure monitor to blood pressure (Patient not taking:  Reported on 10/29/2019) 1 kit 0  . thiamine 100 MG tablet Take 1 tablet (100 mg total) by mouth daily. (Patient not taking: Reported on 10/29/2019) 90 tablet 1   No facility-administered medications prior to visit.     Allergies  Allergen Reactions  . Fruit & Vegetable Daily [Nutritional Supplements] Shortness Of Breath    Aloe  . Iodinated Diagnostic Agents Swelling    Swelling and itching of left side of her face only after CT SI injection(no steroid used)  . Ibuprofen Other (See Comments)    Stomach upset; However, pt can take Meloxicam without incident and takes this at home.   . Aloe Vera Rash    ROS Review of Systems Review of Systems  Constitutional: Negative for activity change, appetite change, chills and fever.  HENT: Negative for congestion, nosebleeds, trouble swallowing and voice change.   Respiratory: Negative for cough, shortness of breath and wheezing.   Gastrointestinal: ++diarrhea, +nausea but no vomiting.  Genitourinary: Negative for difficulty urinating, dysuria, flank pain and hematuria.  Musculoskeletal: + back pain, no joint swelling and neck pain.  Neurological: ++dizziness, no speech difficulty, ++light-headedness, no numbness.  See HPI. All other review of systems negative.     Objective:    Physical Exam  BP (!) 82/44   Pulse 69   Temp (!) 97.5 F (36.4 C) (Oral)   Ht 5' 3.5" (1.613 m)  Wt 117 lb (53.1 kg)   SpO2 99%   BMI 20.40 kg/m  Wt Readings from Last 3 Encounters:  10/29/19 117 lb (53.1 kg)  10/22/19 121 lb (54.9 kg)  09/28/19 114 lb (51.7 kg)   Physical Exam  Constitutional: Oriented to person, place, and time. Appears well-developed and well-nourished.  HENT:  Head: Normocephalic and atraumatic.  Eyes: Conjunctivae and EOM are normal. When patient is asked to sit up her eyes rolled back. Cardiovascular: Normal rate, regular rhythm, normal heart sounds and intact distal pulses.  No murmur heard. Pulmonary/Chest: Effort normal  and breath sounds normal. No stridor. No respiratory distress. Has no wheezes.  Abdomen: nondistended, normoactive bs, soft, nontender Neurological: Is alert and oriented to person, place, and time.  Skin: Skin is warm. Capillary refill takes less than 2 seconds.  Psychiatric: Has a normal mood and affect. Behavior is normal. Judgment and thought content normal.   ECG: sinus brady, LAE No twi, no stemi  Health Maintenance Due  Topic Date Due  . MAMMOGRAM  09/03/2018  . COLONOSCOPY  06/04/2019    There are no preventive care reminders to display for this patient.  Lab Results  Component Value Date   TSH 0.869 09/05/2019   Lab Results  Component Value Date   WBC 6.2 10/12/2019   HGB 13.5 10/12/2019   HCT 39.9 10/12/2019   MCV 109.3 (H) 10/12/2019   PLT 127 (L) 10/12/2019   Lab Results  Component Value Date   NA 138 10/22/2019   K 4.5 10/22/2019   CO2 26 10/22/2019   GLUCOSE 89 10/22/2019   BUN 8 10/22/2019   CREATININE 0.94 10/22/2019   BILITOT 0.3 10/22/2019   ALKPHOS 122 (H) 10/22/2019   AST 88 (H) 10/22/2019   ALT 60 (H) 10/22/2019   PROT 7.2 10/22/2019   ALBUMIN 4.3 10/22/2019   CALCIUM 9.5 10/22/2019   ANIONGAP 9 10/12/2019   Lab Results  Component Value Date   CHOL 203 (H) 09/05/2019   Lab Results  Component Value Date   HDL 91 09/05/2019   Lab Results  Component Value Date   LDLCALC 98 09/05/2019   Lab Results  Component Value Date   TRIG 79 09/05/2019   Lab Results  Component Value Date   CHOLHDL 2.2 09/05/2019   Lab Results  Component Value Date   HGBA1C 5.2 08/29/2019      Assessment & Plan:   Problem List Items Addressed This Visit      Cardiovascular and Mediastinum   Syncope    Other Visit Diagnoses    Hypotension, postural    -  Primary   Relevant Orders   EKG 12-Lead   Nausea without vomiting          Medical Decision Making Patient with history of hypertension and seizure disorder S/p episodes of diarrhea Presents  with severe hypotension, with dizziness and syncope She appears dehydrated and lethargic Advised transfer to the ER due to her low HR on ECG and her hypotension She is agreeable Risk of going home includes falling and seizing or having seizure from electrolyte disorders Pt and her daughter are aggreeable Called 911 for EMS     No orders of the defined types were placed in this encounter.   Follow-up: No follow-ups on file.   A total of 40 minutes were spent face-to-face with the patient during this encounter and over half of that time was spent on counseling and coordination of care.   Forrest Moron,  MD 

## 2019-10-29 NOTE — Patient Instructions (Addendum)
If you have lab work done today you will be contacted with your lab results within the next 2 weeks.  If you have not heard from [place] then please contact us. The fastest way to get your results is to register for My Chart.   IF you received an x-ray today, you will receive an invoice from [facility]. Please contact [facility] at [phone] with questions or concerns regarding your invoice.   IF you received labwork today, you will receive an invoice from [facility]. Please contact [facility] at [phone] with questions or concerns regarding your invoice.   Our billing staff will not be able to assist you with questions regarding bills from these companies.  You will be contacted with the lab results as soon as they are available. The fastest way to get your results is to activate your My Chart account. Instructions are located on the last page of this paperwork. If you have not heard from [place] regarding the results in 2 weeks, please contact this office.      Syncope  Syncope refers to a condition in which a person temporarily loses consciousness. Syncope may also be called fainting or passing out. It is caused by a sudden decrease in blood flow to the brain. Even though most causes of syncope are not dangerous, syncope can be a sign of a serious medical problem. Your health care provider may do tests to find the reason why you are having syncope. Signs that you may be about to faint include:  Feeling dizzy or light-headed.  Feeling nauseous.  Seeing all white or all black in your field of vision.  Having cold, clammy skin. If you faint, get medical help right away. Call your local emergency services (911 in the [place] Do not drive yourself to the hospital. Follow these instructions at home: Pay attention to any changes in your symptoms. Take these actions to stay safe and to help relieve your symptoms: Lifestyle  Do not drive, use machinery, or play sports  until your health care provider says it is okay.  Do not drink alcohol.  Do not use any products that contain nicotine or tobacco, such as cigarettes and e-cigarettes. If you need help quitting, ask your health care provider.  Drink enough fluid to keep your urine pale yellow. General instructions  Take over-the-counter and prescription medicines only as told by your health care provider.  If you are taking blood pressure or heart medicine, get up slowly and take several minutes to sit and then stand. This can reduce dizziness or light-headedness.  Have someone stay with you until you feel stable.  If you start to feel like you might faint, lie down right away and raise (elevate) your feet above the level of your heart. Breathe deeply and steadily. Wait until all the symptoms have passed.  Keep all follow-up visits as told by your health care provider. This is important. Get help right away if you:  Have a severe headache.  Faint once or repeatedly.  Have pain in your chest, abdomen, or back.  Have a very fast or irregular heartbeat (palpitations).  Have pain when you breathe.  Are bleeding from your mouth or rectum, or you have black or tarry stool.  Have a seizure.  Are confused.  Have trouble walking.  Have severe weakness.  Have vision problems. These symptoms may represent a serious problem that is an emergency. Do not wait to see if your symptoms will go away. Get  medical help right away. Call your local emergency services (911 in the [place] Do not drive yourself to the hospital. Summary  Syncope refers to a condition in which a person temporarily loses consciousness. It is caused by a sudden decrease in blood flow to the brain.  Signs that you may be about to faint include dizziness, feeling light-headed, feeling nauseous, sudden vision changes, or cold, clammy skin.  Although most causes of syncope are not dangerous, syncope can be a sign of a serious medical  problem. If you faint, get medical help right away. This information is not intended to replace advice given to you by your health care provider. Make sure you discuss any questions you have with your health care provider. Document Released: 11/29/2005 Document Revised: 11/11/2017 Document Reviewed: 11/07/2017 Elsevier Patient Education  2020 [employer].  Syncope  Syncope refers to a condition in which a person temporarily loses consciousness. Syncope may also be called fainting or passing out. It is caused by a sudden decrease in blood flow to the brain. Even though most causes of syncope are not dangerous, syncope can be a sign of a serious medical problem. Your health care provider may do tests to find the reason why you are having syncope. Signs that you may be about to faint include:  Feeling dizzy or light-headed.  Feeling nauseous.  Seeing all white or all black in your field of vision.  Having cold, clammy skin. If you faint, get medical help right away. Call your local emergency services (911 in the [place] Do not drive yourself to the hospital. Follow these instructions at home: Pay attention to any changes in your symptoms. Take these actions to stay safe and to help relieve your symptoms: Lifestyle  Do not drive, use machinery, or play sports until your health care provider says it is okay.  Do not drink alcohol.  Do not use any products that contain nicotine or tobacco, such as cigarettes and e-cigarettes. If you need help quitting, ask your health care provider.  Drink enough fluid to keep your urine pale yellow. General instructions  Take over-the-counter and prescription medicines only as told by your health care provider.  If you are taking blood pressure or heart medicine, get up slowly and take several minutes to sit and then stand. This can reduce dizziness or light-headedness.  Have someone stay with you until you feel stable.  If you start to feel like you  might faint, lie down right away and raise (elevate) your feet above the level of your heart. Breathe deeply and steadily. Wait until all the symptoms have passed.  Keep all follow-up visits as told by your health care provider. This is important. Get help right away if you:  Have a severe headache.  Faint once or repeatedly.  Have pain in your chest, abdomen, or back.  Have a very fast or irregular heartbeat (palpitations).  Have pain when you breathe.  Are bleeding from your mouth or rectum, or you have black or tarry stool.  Have a seizure.  Are confused.  Have trouble walking.  Have severe weakness.  Have vision problems. These symptoms may represent a serious problem that is an emergency. Do not wait to see if your symptoms will go away. Get medical help right away. Call your local emergency services (911 in the [place] Do not drive yourself to the hospital. Summary  Syncope refers to a condition in which a person temporarily loses consciousness. It is caused by a sudden  decrease in blood flow to the brain.  Signs that you may be about to faint include dizziness, feeling light-headed, feeling nauseous, sudden vision changes, or cold, clammy skin.  Although most causes of syncope are not dangerous, syncope can be a sign of a serious medical problem. If you faint, get medical help right away. This information is not intended to replace advice given to you by your health care provider. Make sure you discuss any questions you have with your health care provider. Document Released: 11/29/2005 Document Revised: 11/11/2017 Document Reviewed: 11/07/2017 Elsevier Patient Education  2020 [employer].

## 2019-10-29 NOTE — Discharge Instructions (Addendum)
Continue to your current medications.  Make sure to stay well-hydrated.  Follow-up with your primary care doctor to be rechecked.

## 2019-10-29 NOTE — ED Triage Notes (Signed)
Pt arrived EMS from [facility] facility c/o dizziness and hypotension. Pt had a doctor's appointment today and reported that her BP was in the 80/60s. Pt also c/o 7 loose stools since last night. Hx: asthma, HTN, and seizures. Pt reports that she did NOT take her BP medicine this morning because she was feeling dizzy.

## 2019-10-29 NOTE — ED Provider Notes (Signed)
[facility] DEPT Provider Note   CSN: [id number] Arrival date & time: 10/29/19  1119     History   Chief Complaint Chief Complaint  Patient presents with  . Hypotension    dizziness    HPI Melissa Bridges is a 58 y.o. female.     HPI Pt presents with complaints of feeling dizzy, lightheaded and having low blood pressure.  This has been off and on for months.   The last two days it has been bad.  Pt has been having diarrhea, loose stools the last couple of days.  It has stopped today.  No blood in her stool.   No vomiting, but she has had some nausea that responded to medications.  No CP.  Some shortness of breath with her asthma but nothing new.  No fevers.  Pt did get weak and fall the other night.  She has some back pain.  She went to her doctors office today.  While there she felt very lightheaded.  She felt like she might pass out.  She was at the doctors today and was sent to the ED after her blood pressure was noted to be in 80s. Past Medical History:  Diagnosis Date  . Allergy   . Alopecia   . Anxiety   . Arthritis   . Asthma   . Cataract    "mild"  . COPD (chronic obstructive pulmonary disease) ([place])   . Depression   . GERD (gastroesophageal reflux disease)    o cc- takes OTC if needed.  . Glaucoma   . Hepatitis C   . Hypertension    onset age 53.  . Iron deficiency anemia   . Migraine   . Schizoaffective disorder ([place])   . Sciatic pain   . Seizures ([place])    onset in childhood. 06-03-16- per pt 8 months agoGeneralized tonic-clonic.Last seizure couple of months ago- last sz 9-10 months ago per pt 10-12-2016  . Shortness of breath dyspnea   . Substance abuse ([place])   . Ulcer     Patient Active Problem List   Diagnosis Date Noted  . Orthostatic hypotension   . Syncope 10/11/2019  . Screening examination for STD (sexually transmitted disease) 06/20/2019  . Vaginal discharge 06/20/2019  . Dysuria 06/20/2019  . Bacterial vaginosis  06/20/2019  . Hematuria of unknown cause 06/20/2019  . Acute non-recurrent sinusitis 03/28/2019  . Overdose   . Melena   . Suicidal ideation 07/09/2018  . Anemia 07/09/2018  . Cocaine abuse ([place]) 05/24/2018  . Hypomagnesemia 05/24/2018  . Chronic left hip pain 05/24/2018  . Hypokalemia 05/23/2018  . Tylenol overdose 05/23/2018  . Abdominal pain 05/23/2018  . COPD (chronic obstructive pulmonary disease) ([place]) 05/23/2018  . Serrated adenoma of colon 08/05/2016  . Localization-related idiopathic epilepsy and epileptic syndromes with seizures of localized onset, not intractable, without status epilepticus ([place]) 10/16/2015  . 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 Seizure disorder ([place]) 09/09/2014  . Asthma with acute exacerbation 03/09/2013  . Hypertension 03/09/2013  . Lower back pain 03/09/2013  . Chronic hepatitis C without hepatic coma ([place]) 03/08/2013  . Smoker 12/12/2012  . Schizoaffective disorder, bipolar type ([place]) 12/12/2012    Past Surgical History:  Procedure Laterality Date  . ABDOMINAL HYSTERECTOMY     ovarian cyst B, cervical dysplasia, fibroids.   Ovaries intact.  [name] BIOPSY  07/11/2018   Procedure: BIOPSY;  Surgeon: Yetta Flock, MD;  Location: [facility];  Service: Gastroenterology;;  . COLONOSCOPY    .  COLONOSCOPY W/ BIOPSIES    . DILATION AND CURETTAGE OF UTERUS     x2  . ESOPHAGOGASTRODUODENOSCOPY (EGD) WITH PROPOFOL N/A 07/11/2018   Procedure: ESOPHAGOGASTRODUODENOSCOPY (EGD) WITH PROPOFOL;  Surgeon: Yetta Flock, MD;  Location: [facility];  Service: Gastroenterology;  Laterality: N/A;  . EXPLORATORY LAPAROTOMY     x3  . GYNECOLOGIC CRYOSURGERY     x3  . HOT HEMOSTASIS N/A 07/11/2018   Procedure: HOT HEMOSTASIS (ARGON PLASMA COAGULATION/BICAP);  Surgeon: Yetta Flock, MD;  Location: [facility];  Service: Gastroenterology;  Laterality: N/A;  . MANDIBLE RECONSTRUCTION N/A 06/27/2015   Procedure: REMOVAL MAXILLARY PALATAL TORUS.   REMOVAL MANDIBULAR TORUS AND EXOSTOSIS.  ;  Surgeon: Diona Browner, DDS;  Location: [facility];  Service: Oral Surgery;  Laterality: N/A;  . OVARIAN CYST REMOVAL  2008  . POLYPECTOMY       OB History   No obstetric history on file.      Home Medications    Prior to Admission medications   Medication Sig Start Date End Date Taking? Authorizing Provider  albuterol (PROAIR HFA) 108 (90 Base) MCG/ACT inhaler INHALE 2 PUFFS BY MOUTH EVERY 6 HOURS AS NEEDED FOR SHORTNESS OF BREATH Patient taking differently: Inhale 2 puffs into the lungs every 6 (six) hours as needed for shortness of breath.  06/20/19   Rutherford Guys, MD  amLODipine (NORVASC) 5 MG tablet Take 1 tablet (5 mg total) by mouth daily. 09/05/19   Forrest Moron, MD  Blood Pressure Monitor KIT Dispense one blood pressure monitor to blood pressure Patient not taking: Reported on 10/29/2019 10/22/19   Forrest Moron, MD  budesonide-formoterol [facility]) 160-4.5 MCG/ACT inhaler Inhale 2 puffs into the lungs 2 (two) times daily. 03/28/19   Corum, Rex Kras, MD  citalopram (CELEXA) 20 MG tablet Take 1 tablet (20 mg total) by mouth daily. 08/29/19   Delia Chimes A, MD  diphenhydrAMINE-APAP, sleep, (TYLENOL PM EXTRA STRENGTH PO) Take 500 mg by mouth once.     [provider]  gabapentin (NEURONTIN) 400 MG capsule Take 1 capsule (400 mg total) by mouth 3 (three) times daily. 05/21/19   Forrest Moron, MD  ipratropium-albuterol (DUONEB) 0.5-2.5 (3) MG/3ML SOLN Take 3 mLs by nebulization every 6 (six) hours as needed (shortness of breath). Reported on 12/02/2015 03/28/19   Maryruth Hancock, MD  lamoTRIgine (LAMICTAL) 25 MG tablet Take 2 tablets (50 mg total) by mouth daily. 06/25/19   Cameron Sprang, MD  Multiple Vitamin (MULTIVITAMIN WITH MINERALS) TABS tablet Take 1 tablet by mouth daily. 07/16/18   Emokpae, Courage, MD  ondansetron (ZOFRAN) 4 MG tablet TAKE 1 TABLET BY MOUTH EVERY 6 HOURS AS NEEDED FOR NAUSEA 08/27/19   Delia Chimes A, MD   QUEtiapine (SEROQUEL) 50 MG tablet Take 3 tablets (150 mg total) by mouth at bedtime. 07/15/18   Roxan Hockey, MD  thiamine 100 MG tablet Take 1 tablet (100 mg total) by mouth daily. Patient not taking: Reported on 10/29/2019 02/05/19   Rutherford Guys, MD  tiZANidine (ZANAFLEX) 4 MG tablet Take one tablet daily 05/21/19   Delia Chimes A, MD  triamcinolone cream (KENALOG) 0.1 % APPLY CREAM EXTERNALLY TWICE DAILY 10/03/19   Delia Chimes A, MD  valACYclovir (VALTREX) 1000 MG tablet TAKE 2 TABLETS BY MOUTH AT SYMPTOM ONSET, REPEAT IN 12 HOURS Patient taking differently: Take 2,000 mg by mouth See admin instructions. TAKE 2 TABLETS BY MOUTH AT SYMPTOM ONSET, REPEAT IN 12 HOURS 10/03/19  Rutherford Guys, MD    Family History Family History  Problem Relation Age of Onset  . Diabetes type II Mother   . Hypertension Mother   . Arthritis Mother   . Diabetes Mother   . Mental illness Mother        bipolar  . Diabetes type II Maternal Aunt   . Hypertension Father   . Heart murmur Father   . Arthritis Father   . Stroke Father   . Alopecia Sister   . Alopecia Brother   . Alopecia Sister   . Mental retardation Sister        depression  . Prostate cancer Paternal Grandfather   . Cancer Maternal Uncle   . Colon cancer Neg Hx   . Esophageal cancer Neg Hx   . Rectal cancer Neg Hx   . Stomach cancer Neg Hx   . Colon polyps Neg Hx     Social History Social History   Tobacco Use  . Smoking status: Current Every Day Smoker    Packs/day: 0.33    Years: 44.00    Pack years: 14.52    Types: Cigarettes  . Smokeless tobacco: Never Used  Substance Use Topics  . Alcohol use: Yes    Alcohol/week: 0.0 standard drinks    Comment: 1 pakj every 3 days  . Drug use: Yes    Types: Marijuana, Cocaine     Allergies   Fruit & vegetable daily [nutritional supplements], Iodinated diagnostic agents, Ibuprofen, Aloe vera, and Aspirin   Review of Systems Review of Systems  All other systems  reviewed and are negative.    Physical Exam Updated Vital Signs BP 121/72 (BP Location: Right Arm)   Pulse 66   Temp 97.7 F (36.5 C) (Oral)   Resp 19   Ht 1.6 m (5' 3" )   Wt 53.1 kg   SpO2 100%   BMI 20.73 kg/m   Physical Exam Vitals signs and nursing note reviewed.  Constitutional:      General: She is not in acute distress.    Appearance: She is well-developed.  HENT:     Head: Normocephalic and atraumatic.     Right Ear: External ear normal.     Left Ear: External ear normal.  Eyes:     General: No scleral icterus.       Right eye: No discharge.        Left eye: No discharge.     Conjunctiva/sclera: Conjunctivae normal.  Neck:     Musculoskeletal: Neck supple.     Trachea: No tracheal deviation.  Cardiovascular:     Rate and Rhythm: Normal rate and regular rhythm.  Pulmonary:     Effort: Pulmonary effort is normal. No respiratory distress.     Breath sounds: Normal breath sounds. No stridor. No wheezing or rales.  Abdominal:     General: Bowel sounds are normal. There is no distension.     Palpations: Abdomen is soft.     Tenderness: There is no abdominal tenderness. There is no guarding or rebound.  Musculoskeletal:        General: No tenderness.  Skin:    General: Skin is warm and dry.     Findings: No rash.  Neurological:     Mental Status: She is alert.     Cranial Nerves: No cranial nerve deficit (no facial droop, extraocular movements intact, no slurred speech).     Sensory: No sensory deficit.     Motor: No abnormal muscle  tone or seizure activity.     Coordination: Coordination normal.      ED Treatments / Results  Labs (all labs ordered are listed, but only abnormal results are displayed) Labs Reviewed  CBC WITH DIFFERENTIAL/PLATELET - Abnormal; Notable for the following components:      Result Value   RBC 3.52 (*)    MCV 105.1 (*)    MCH 37.8 (*)    All other components within normal limits  COMPREHENSIVE METABOLIC PANEL - Abnormal;  Notable for the following components:   Sodium 130 (*)    Calcium 8.6 (*)    Albumin 3.4 (*)    AST 62 (*)    ALT 55 (*)    All other components within normal limits  URINALYSIS, ROUTINE W REFLEX MICROSCOPIC    EKG EKG Interpretation  Date/Time:  Monday October 29 2019 12:02:50 EST Ventricular Rate:  60 PR Interval:    QRS Duration: 104 QT Interval:  483 QTC Calculation: 483 R Axis:   49 Text Interpretation: Sinus rhythm Probable left atrial enlargement Left ventricular hypertrophy Minimal ST elevation, anterolateral leads No significant change since last tracing Confirmed by Dorie Rank [id number]) on 10/29/2019 2:46:15 PM   Radiology Dg Lumbar Spine Complete  Result Date: 10/29/2019 CLINICAL DATA:  Patient fell. Hypotension. Dizziness. Diarrhea. EXAM: LUMBAR SPINE - COMPLETE 4+ VIEW COMPARISON:  04/10/2018 FINDINGS: There is no fracture bone destruction. Lateral alignment is normal. No disc space narrowing. Diffuse facet arthritis is present throughout the thoracic spine, most prominent at L2-3 and L3-4. This is unchanged. IMPRESSION: No acute abnormality. Electronically Signed   By: Lorriane Shire M.D.   On: 10/29/2019 13:42    Procedures Procedures (including critical care time)  Medications Ordered in ED Medications  sodium chloride 0.9 % bolus 1,000 mL (0 mLs Intravenous Stopped 10/29/19 1352)    Followed by  0.9 %  sodium chloride infusion (1,000 mLs Intravenous New Bag/Given (Non-Interop) 10/29/19 1356)  acetaminophen (TYLENOL) tablet 650 mg (650 mg Oral Given 10/29/19 1207)     Initial Impression / Assessment and Plan / ED Course  I have reviewed the triage vital signs and the nursing notes.  Pertinent labs & imaging results that were available during my care of the patient were reviewed by me and considered in my medical decision making (see chart for details).  Clinical Course as of Oct 29 1523  Mon Oct 29, 2019  1424 Blood pressure has improved.  No  hypotension noted.  Laboratory tests are reassuring with exception of hyponatremia.   [JK]  1425 Hyponatremia is new compared to prior tracings.  Patient was given IV fluids.   [JK]  1500 On exam repeated.  No crackles   [JK]  1511 Patient has been able to ambulate around the ED.  No hypoxia or hypotension.  Patient is feeling better.   [SF]  6812 Discussed findings with patient's daughter.  She asked me about the patient's hypoxia.  There is no documentation of hypoxia in the ED records but apparently while in the ED patient's pulse ox was alarming.  The ED tech came into the room and placed the patient on oxygen.  Patient is 100% on room air here.  She was able to ambulate.  I suspect this reported hypoxia may have been an inaccurate reading.   [JK]    Clinical Course User Index [JK] Dorie Rank, MD     Patient presented to the ED for evaluation of hypotension.  Patient was noted to be  hypotensive in his doctor's office.  Her initial blood pressures here are low but she has responded well to IV fluids.  Patient had diarrhea recently and I suspect this may have contributed to dehydration and hypotension.  Patient also has hyponatremia and this may have been related to her diarrhea as well.  Patient is feeling well now and is ready for discharge.  Final Clinical Impressions(s) / ED Diagnoses   Final diagnoses:  Dehydration  Orthostatic hypotension    ED Discharge Orders    None       Dorie Rank, MD 10/30/19 [phone]

## 2019-10-29 NOTE — ED Notes (Signed)
Pt walked to the bathroom and back to room with the help of her daughter.  Pt was a little wobbly but she feels better than when she came in and was no longer dizzy.  Pt was leaning pretty heavily on her daughter but that could be because she has been laying in the bed for so long.

## 2019-11-01 ENCOUNTER — Other Ambulatory Visit: Payer: Self-pay | Admitting: Family Medicine

## 2019-11-01 NOTE — Telephone Encounter (Signed)
Requested medication (s) are due for refill today: yes  Requested medication (s) are on the active medication list: yes    Future visit scheduled: yes  Notes to clinic:  [place], QUANTITY, AND NUMBER OF REFILLS AUTHORIZED IF APPROPRIATE   Requested Prescriptions  Pending Prescriptions Disp Refills   amLODipine ([place]) 10 MG tablet [Pharmacy Med Name: AMLODIPINE 10MG  TAB] 0 tablet     Sig: PLEASE SEND IN A NEW PRESCRIPTION WITH DIRECTIONS, QUANTITY, AND NUMBER OF REFILLS AUTHORIZED IF APPROPRIATE.     Cardiovascular:  Calcium Channel Blockers Passed - 11/01/2019  9:40 AM      Passed - Last BP in normal range    BP Readings from Last 1 Encounters:  10/29/19 121/72         Passed - Valid encounter within last 6 months    Recent Outpatient Visits          3 days ago Hypotension, postural   [facility] at [facility], Zoe A, MD   1 week ago Orthostatic hypotension   Primary Care at [facility], Arlie Solomons, MD   1 month ago Gross hematuria   Primary Care at [facility], Arlie Solomons, MD   1 month ago Medicare annual wellness visit, subsequent   Primary Care at [facility], Arlie Solomons, MD   2 months ago Essential hypertension   Primary Care at [place], MD      Future Appointments            In 4 months Forrest Moron, MD Primary Care at [place], [facility]

## 2019-11-02 ENCOUNTER — Other Ambulatory Visit: Payer: Self-pay | Admitting: Family Medicine

## 2019-11-02 ENCOUNTER — Other Ambulatory Visit: Payer: Self-pay | Admitting: Neurology

## 2019-11-02 NOTE — Telephone Encounter (Signed)
Forwarding medication refill request to the clinical pool for review. 

## 2019-11-08 ENCOUNTER — Encounter (HOSPITAL_COMMUNITY): Payer: Self-pay | Admitting: *Deleted

## 2019-11-08 ENCOUNTER — Other Ambulatory Visit: Payer: Self-pay

## 2019-11-08 ENCOUNTER — Emergency Department (HOSPITAL_COMMUNITY)
Admission: EM | Admit: 2019-11-08 | Discharge: 2019-11-08 | Disposition: A | Discharge status: Home / Self Care | Payer: 59 | Attending: Emergency Medicine | Admitting: Emergency Medicine

## 2019-11-08 DIAGNOSIS — R42 Dizziness and giddiness: Secondary | ICD-10-CM | POA: Diagnosis not present

## 2019-11-08 DIAGNOSIS — R410 Disorientation, unspecified: Secondary | ICD-10-CM

## 2019-11-08 DIAGNOSIS — Z8673 Personal history of transient ischemic attack (TIA), and cerebral infarction without residual deficits: Secondary | ICD-10-CM | POA: Insufficient documentation

## 2019-11-08 DIAGNOSIS — R4182 Altered mental status, unspecified: Secondary | ICD-10-CM | POA: Diagnosis present

## 2019-11-08 DIAGNOSIS — F17210 Nicotine dependence, cigarettes, uncomplicated: Secondary | ICD-10-CM | POA: Diagnosis not present

## 2019-11-08 DIAGNOSIS — Z79899 Other long term (current) drug therapy: Secondary | ICD-10-CM | POA: Diagnosis not present

## 2019-11-08 DIAGNOSIS — J449 Chronic obstructive pulmonary disease, unspecified: Secondary | ICD-10-CM | POA: Insufficient documentation

## 2019-11-08 DIAGNOSIS — I10 Essential (primary) hypertension: Secondary | ICD-10-CM | POA: Diagnosis not present

## 2019-11-08 DIAGNOSIS — Z20828 Contact with and (suspected) exposure to other viral communicable diseases: Secondary | ICD-10-CM | POA: Insufficient documentation

## 2019-11-08 DIAGNOSIS — F1910 Other psychoactive substance abuse, uncomplicated: Secondary | ICD-10-CM

## 2019-11-08 LAB — COMPREHENSIVE METABOLIC PANEL
ALT: 46 U/L — ABNORMAL HIGH (ref 0–44)
AST: 74 U/L — ABNORMAL HIGH (ref 15–41)
Albumin: 3.3 g/dL — ABNORMAL LOW (ref 3.5–5.0)
Alkaline Phosphatase: 58 U/L (ref 38–126)
Anion gap: 9 (ref 5–15)
BUN: 9 mg/dL (ref 6–20)
CO2: 23 mmol/L (ref 22–32)
Calcium: 8.7 mg/dL — ABNORMAL LOW (ref 8.9–10.3)
Chloride: 101 mmol/L (ref 98–111)
Creatinine, Ser: 1.3 mg/dL — ABNORMAL HIGH (ref 0.44–1.00)
GFR calc Af Amer: 52 mL/min — ABNORMAL LOW (ref >60)
GFR calc non Af Amer: 45 mL/min — ABNORMAL LOW (ref >60)
Glucose, Bld: 105 mg/dL — ABNORMAL HIGH (ref 70–99)
Potassium: 3.6 mmol/L (ref 3.5–5.1)
Sodium: 133 mmol/L — ABNORMAL LOW (ref 135–145)
Total Bilirubin: 0.8 mg/dL (ref 0.3–1.2)
Total Protein: 6.5 g/dL (ref 6.5–8.1)

## 2019-11-08 LAB — CBC WITH DIFFERENTIAL/PLATELET
Abs Immature Granulocytes: 0.03 10*3/uL (ref 0.00–0.07)
Basophils Absolute: 0 10*3/uL (ref 0.0–0.1)
Basophils Relative: 1 %
Eosinophils Absolute: 0.1 10*3/uL (ref 0.0–0.5)
Eosinophils Relative: 2 %
HCT: 32.6 % — ABNORMAL LOW (ref 36.0–46.0)
Hemoglobin: 11.7 g/dL — ABNORMAL LOW (ref 12.0–15.0)
Immature Granulocytes: 0 %
Lymphocytes Relative: 20 %
Lymphs Abs: 1.6 10*3/uL (ref 0.7–4.0)
MCH: 38.1 pg — ABNORMAL HIGH (ref 26.0–34.0)
MCHC: 35.9 g/dL (ref 30.0–36.0)
MCV: 106.2 fL — ABNORMAL HIGH (ref 80.0–100.0)
Monocytes Absolute: 1 10*3/uL (ref 0.1–1.0)
Monocytes Relative: 12 %
Neutro Abs: 5.5 10*3/uL (ref 1.7–7.7)
Neutrophils Relative %: 65 %
Platelets: 273 10*3/uL (ref 150–400)
RBC: 3.07 MIL/uL — ABNORMAL LOW (ref 3.87–5.11)
RDW: 13 % (ref 11.5–15.5)
WBC: 8.3 10*3/uL (ref 4.0–10.5)
nRBC: 0 % (ref 0.0–0.2)

## 2019-11-08 LAB — SARS CORONAVIRUS 2 (TAT 6-24 HRS): SARS Coronavirus 2: NEGATIVE

## 2019-11-08 LAB — PROTIME-INR
INR: 1 (ref 0.8–1.2)
Prothrombin Time: 12.9 seconds (ref 11.4–15.2)

## 2019-11-08 LAB — ACETAMINOPHEN LEVEL: Acetaminophen (Tylenol), Serum: <10 ug/mL — ABNORMAL LOW (ref 10–30)

## 2019-11-08 MED ORDER — SODIUM CHLORIDE 0.9 % IV BOLUS
1000.0000 mL | Freq: Once | INTRAVENOUS | Status: AC
  Administered 2019-11-08: 1000 mL via INTRAVENOUS

## 2019-11-08 NOTE — ED Provider Notes (Addendum)
F/U chemistry and re-eval for possible D/C. Physical Exam  BP 121/72   Pulse 64   Temp 98.2 F (36.8 C) (Oral)   Resp 15   Ht 5' 3.75" (1.619 m)   Wt 53.1 kg   SpO2 93%   BMI 20.24 kg/m   Physical Exam  ED Course/Procedures     Procedures  MDM  Pharmacy tech advised me that the patient reports taking at 1000 g of Tylenol up to 4-5 times a day.  Will add acetaminophen level and PT/INR.  Patient's LFTs show a chronic stable elevation.  Total bili is normal.  PT/INR normal.  I have counseled the patient regarding overusage of Tylenol.  Her daughter however reports that she has no other options for pain control and they have explored many options for management of chronic pain.  Patient is alert and interactive.  Her speech is clear.  She is jovial.  I have had a long discussion with the patient and her daughter at bedside.  Her daughter reports her significant problems with ongoing alcohol dependency and substance abuse.  She reports they have tried many avenues for treatment as well as avenues for chronic pain management.  Unfortunately, this has been very difficult due to patient's dual diagnoses of polysubstance abuse and mental illness.  At this time however, patient is alert and showing no signs of distress.  Her mood is elevated and she is interactive.  Vital signs are stable.  She certainly has high risk of recurrence of complications of medical/substance abuse/psychiatric issues.  Currently however I do not see indication for hospital admission.      Charlesetta Shanks, MD 11/08/19 1551    Charlesetta Shanks, MD 11/08/19 1605    Charlesetta Shanks, MD 11/08/19 Penni Bombard    Charlesetta Shanks, MD 11/08/19 [phone]

## 2019-11-08 NOTE — ED Provider Notes (Signed)
[facility] EMERGENCY DEPARTMENT Provider Note   CSN: [id number] Arrival date & time: 11/08/19  1353     History   Chief Complaint Chief Complaint  Patient presents with  . Altered Mental Status    HPI Melissa Bridges is a 58 y.o. female.     HPI Patient presents after an episode of dizziness.  She notes that she has had similar episodes going back quite some time, and today's episode was similar to those. She currently denies any complaints, including chest pain, abdominal pain, focal weakness, confusion, disorientation, fever. She notes that since hospitalization about 1 month ago she has had no additional medication changes, though she acknowledges having some done during that discharge. She states that she is generally well, there is no clear precipitant for today's episode.  Past Medical History:  Diagnosis Date  . Allergy   . Alopecia   . Anxiety   . Arthritis   . Asthma   . Cataract    "mild"  . COPD (chronic obstructive pulmonary disease) ([place])   . Depression   . GERD (gastroesophageal reflux disease)    o cc- takes OTC if needed.  . Glaucoma   . Hepatitis C   . Hypertension    onset age 12.  . Iron deficiency anemia   . Migraine   . Schizoaffective disorder ([place])   . Sciatic pain   . Seizures ([place])    onset in childhood. 06-03-16- per pt 8 months agoGeneralized tonic-clonic.Last seizure couple of months ago- last sz 9-10 months ago per pt 10-12-2016  . Shortness of breath dyspnea   . Substance abuse ([place])   . Ulcer     Patient Active Problem List   Diagnosis Date Noted  . Orthostatic hypotension   . Syncope 10/11/2019  . Screening examination for STD (sexually transmitted disease) 06/20/2019  . Vaginal discharge 06/20/2019  . Dysuria 06/20/2019  . Bacterial vaginosis 06/20/2019  . Hematuria of unknown cause 06/20/2019  . Acute non-recurrent sinusitis 03/28/2019  . Overdose   . Melena   . Suicidal ideation 07/09/2018  . Anemia  07/09/2018  . Cocaine abuse ([place]) 05/24/2018  . Hypomagnesemia 05/24/2018  . Chronic left hip pain 05/24/2018  . Hypokalemia 05/23/2018  . Tylenol overdose 05/23/2018  . Abdominal pain 05/23/2018  . COPD (chronic obstructive pulmonary disease) ([place]) 05/23/2018  . Serrated adenoma of colon 08/05/2016  . Localization-related idiopathic epilepsy and epileptic syndromes with seizures of localized onset, not intractable, without status epilepticus ([place]) 10/16/2015  . 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 Seizure disorder ([place]) 09/09/2014  . Asthma with acute exacerbation 03/09/2013  . Hypertension 03/09/2013  . Lower back pain 03/09/2013  . Chronic hepatitis C without hepatic coma ([place]) 03/08/2013  . Smoker 12/12/2012  . Schizoaffective disorder, bipolar type ([place]) 12/12/2012    Past Surgical History:  Procedure Laterality Date  . ABDOMINAL HYSTERECTOMY     ovarian cyst B, cervical dysplasia, fibroids.   Ovaries intact.  [name] BIOPSY  07/11/2018   Procedure: BIOPSY;  Surgeon: Yetta Flock, MD;  Location: [facility];  Service: Gastroenterology;;  . COLONOSCOPY    . COLONOSCOPY W/ BIOPSIES    . DILATION AND CURETTAGE OF UTERUS     x2  . ESOPHAGOGASTRODUODENOSCOPY (EGD) WITH PROPOFOL N/A 07/11/2018   Procedure: ESOPHAGOGASTRODUODENOSCOPY (EGD) WITH PROPOFOL;  Surgeon: Yetta Flock, MD;  Location: [facility];  Service: Gastroenterology;  Laterality: N/A;  . EXPLORATORY LAPAROTOMY     x3  . GYNECOLOGIC CRYOSURGERY  x3  . HOT HEMOSTASIS N/A 07/11/2018   Procedure: HOT HEMOSTASIS (ARGON PLASMA COAGULATION/BICAP);  Surgeon: Yetta Flock, MD;  Location: [facility];  Service: Gastroenterology;  Laterality: N/A;  . MANDIBLE RECONSTRUCTION N/A 06/27/2015   Procedure: REMOVAL MAXILLARY PALATAL TORUS.  REMOVAL MANDIBULAR TORUS AND EXOSTOSIS.  ;  Surgeon: Diona Browner, DDS;  Location: [facility];  Service: Oral Surgery;  Laterality: N/A;  . OVARIAN CYST REMOVAL  2008  .  POLYPECTOMY       OB History   No obstetric history on file.      Home Medications    Prior to Admission medications   Medication Sig Start Date End Date Taking? Authorizing Provider  albuterol (PROAIR HFA) 108 (90 Base) MCG/ACT inhaler INHALE 2 PUFFS BY MOUTH EVERY 6 HOURS AS NEEDED FOR SHORTNESS OF BREATH 11/06/19   Rutherford Guys, MD  amLODipine (NORVASC) 5 MG tablet Take 1 tablet (5 mg total) by mouth daily. 09/05/19   Forrest Moron, MD  Blood Pressure Monitor KIT Dispense one blood pressure monitor to blood pressure Patient not taking: Reported on 10/29/2019 10/22/19   Forrest Moron, MD  budesonide-formoterol [facility]) 160-4.5 MCG/ACT inhaler Inhale 2 puffs into the lungs 2 (two) times daily. 03/28/19   Corum, Rex Kras, MD  citalopram (CELEXA) 20 MG tablet Take 1 tablet (20 mg total) by mouth daily. 08/29/19   Delia Chimes A, MD  diphenhydrAMINE-APAP, sleep, (TYLENOL PM EXTRA STRENGTH PO) Take 500 mg by mouth once.     [provider]  fluconazole (DIFLUCAN) 150 MG tablet TAKE ONE TABLET BY MOUTH TODAY. REPEAT SECOND DOSE IN 3 DAYS. 10/15/19   [provider]  gabapentin (NEURONTIN) 400 MG capsule Take 1 capsule (400 mg total) by mouth 3 (three) times daily. 05/21/19   Forrest Moron, MD  ipratropium-albuterol (DUONEB) 0.5-2.5 (3) MG/3ML SOLN Take 3 mLs by nebulization every 6 (six) hours as needed (shortness of breath). Reported on 12/02/2015 03/28/19   Maryruth Hancock, MD  lamoTRIgine (LAMICTAL) 25 MG tablet TAKE 2 TABLETS (50 MG TOTAL) BY MOUTH DAILY 11/05/19   Cameron Sprang, MD  latanoprost (XALATAN) 0.005 % ophthalmic solution 1 drop daily.    [provider]  metroNIDAZOLE (FLAGYL) 500 MG tablet Take 500 mg by mouth 2 (two) times daily. 10/15/19   [provider]  Multiple Vitamin (MULTIVITAMIN WITH MINERALS) TABS tablet Take 1 tablet by mouth daily. 07/16/18   Emokpae, Courage, MD  ondansetron (ZOFRAN) 4 MG tablet TAKE 1 TABLET BY MOUTH  EVERY 6 HOURS AS NEEDED FOR NAUSEA 08/27/19   Delia Chimes A, MD  QUEtiapine (SEROQUEL) 50 MG tablet Take 3 tablets (150 mg total) by mouth at bedtime. 07/15/18   Roxan Hockey, MD  thiamine 100 MG tablet Take 1 tablet (100 mg total) by mouth daily. Patient not taking: Reported on 10/29/2019 02/05/19   Rutherford Guys, MD  tiZANidine (ZANAFLEX) 4 MG tablet Take one tablet daily 05/21/19   Delia Chimes A, MD  triamcinolone cream (KENALOG) 0.1 % APPLY CREAM EXTERNALLY TWICE DAILY 10/03/19   Delia Chimes A, MD  valACYclovir (VALTREX) 1000 MG tablet TAKE 2 TABLETS BY MOUTH AT SYMPTOM ONSET, REPEAT IN 12 HOURS Patient taking differently: Take 2,000 mg by mouth See admin instructions. TAKE 2 TABLETS BY MOUTH AT SYMPTOM ONSET, REPEAT IN 12 HOURS 10/03/19   Rutherford Guys, MD    Family History Family History  Problem Relation Age of Onset  . Diabetes type II Mother   .  Hypertension Mother   . Arthritis Mother   . Diabetes Mother   . Mental illness Mother        bipolar  . Diabetes type II Maternal Aunt   . Hypertension Father   . Heart murmur Father   . Arthritis Father   . Stroke Father   . Alopecia Sister   . Alopecia Brother   . Alopecia Sister   . Mental retardation Sister        depression  . Prostate cancer Paternal Grandfather   . Cancer Maternal Uncle   . Colon cancer Neg Hx   . Esophageal cancer Neg Hx   . Rectal cancer Neg Hx   . Stomach cancer Neg Hx   . Colon polyps Neg Hx     Social History Social History   Tobacco Use  . Smoking status: Current Every Day Smoker    Packs/day: 0.33    Years: 44.00    Pack years: 14.52    Types: Cigarettes  . Smokeless tobacco: Never Used  Substance Use Topics  . Alcohol use: Yes    Alcohol/week: 0.0 standard drinks    Comment: 1 pakj every 3 days  . Drug use: Yes    Types: Marijuana, Cocaine     Allergies   Fruit & vegetable daily [nutritional supplements], Iodinated diagnostic agents, Ibuprofen, Aloe vera, and  Aspirin   Review of Systems Review of Systems  Constitutional:       Per HPI, otherwise negative  HENT:       Per HPI, otherwise negative  Respiratory:       Per HPI, otherwise negative  Cardiovascular:       Per HPI, otherwise negative  Gastrointestinal: Negative for vomiting.  Endocrine:       Negative aside from HPI  Genitourinary:       Neg aside from HPI   Musculoskeletal:       Per HPI, otherwise negative  Skin: Negative.   Neurological: Positive for dizziness. Negative for syncope.     Physical Exam Updated Vital Signs BP 111/71 (BP Location: Right Arm)   Pulse 64   Temp 98.2 F (36.8 C) (Oral)   Resp 17   SpO2 97%   Physical Exam Vitals signs and nursing note reviewed.  Constitutional:      General: She is not in acute distress.    Appearance: She is well-developed.  HENT:     Head: Normocephalic and atraumatic.  Eyes:     Conjunctiva/sclera: Conjunctivae normal.  Cardiovascular:     Rate and Rhythm: Normal rate and regular rhythm.  Pulmonary:     Effort: Pulmonary effort is normal. No respiratory distress.     Breath sounds: Normal breath sounds. No stridor.  Abdominal:     General: There is no distension.  Skin:    General: Skin is warm and dry.  Neurological:     Mental Status: She is alert and oriented to person, place, and time.     Cranial Nerves: No cranial nerve deficit.      ED Treatments / Results  Labs (all labs ordered are listed, but only abnormal results are displayed) Labs Reviewed  CBC WITH DIFFERENTIAL/PLATELET - Abnormal; Notable for the following components:      Result Value   RBC 3.07 (*)    Hemoglobin 11.7 (*)    HCT 32.6 (*)    MCV 106.2 (*)    MCH 38.1 (*)    All other components within normal limits  SARS CORONAVIRUS 2 (TAT 6-24 HRS)  COMPREHENSIVE METABOLIC PANEL    EKG EKG Interpretation  Date/Time:  Thursday November 08 2019 14:00:24 EST Ventricular Rate:  64 PR Interval:    QRS Duration: 94 QT  Interval:  440 QTC Calculation: 419 R Axis:   18 Text Interpretation: Sinus rhythm Probable left atrial enlargement RSR' in V1 or V2, right VCD or RVH Abnormal ECG Confirmed by Carmin Muskrat [phone]) on 11/08/2019 2:06:19 PM    Procedures Procedures (including critical care time)  Medications Ordered in ED Medications - No data to display   Initial Impression / Assessment and Plan / ED Course  I have reviewed the triage vital signs and the nursing notes.  Pertinent labs & imaging results that were available during my care of the patient were reviewed by me and considered in my medical decision making (see chart for details).       Discharge Summary from visit one month ago:   1  Syncope-secondary to orthostatic hypotension, patient was monitored on telemetry without any arrhythmias.  Patient was hypotensive in the ER.  She was given IV fluids.  She has history of hypertension and her blood pressure started trending up at that point Norvasc was restarted.  Patient had an echocardiogram done and results are pending.      2. ?  Stroke-seen on CT head, discussed with neurologist Dr. Leonel Ramsay, he recommends getting an MRI before initiating stroke work-up.  MRI was obtained and is negative for stroke.   3 Hypokalemia-potassium was 3.1, admission it was repleted and it is 4.2 at the time of discharge   5. Hypertension-restarted Norvasc. 6. History of seizures-continue Lamictal.   6. Schizoaffective disorder-continue Lamictal, Seroquel   Initial vital signs unremarkable, patient is in no distress, with no active complaints. EKG unremarkable as well.  On repeat exam patient is awake, alert, in no distress. She has no hypotension, no evidence for ongoing hemodynamic instability. We discussed all findings, and given the patient's history, ongoing evaluation with outpatient providers, she was discharged to follow-up with them. Absent abnormal labs, EKG, vital signs, low suspicion  for acute new pathology.  3:36 PM Patient looks better, states that she feels better, remains hemodynamically unremarkable. I discussed all findings with the patient's daughter who is a Marine scientist, and is at bedside. We discussed the importance of outpatient follow-up, should today's evaluation be reassuring as well.  Dr. Johnney Killian is aware of the patient.  Final Clinical Impressions(s) / ED Diagnoses  Dizziness   Carmin Muskrat, MD 11/08/19 1546

## 2019-11-08 NOTE — ED Notes (Signed)
Daughter at bedside.

## 2019-11-08 NOTE — ED Notes (Signed)
Pt ambulatory to restroom with 1 assist, pt very unsteady

## 2019-11-08 NOTE — ED Triage Notes (Addendum)
Patient presents to ed via [facility] states she was at her daughters house and became lethargic. Upon ems arrival patient had b/p of 72/46 and 69/43 was given 1 liter NS and increased b/p. C/o being seen in ed several different times for electrolyte imbalances. CBG was 179 with EMS. Upon arrival patient is alert to loud verbal stimuli. Answers questions appropriately. Melissa Bridges

## 2019-11-11 DIAGNOSIS — F1921 Other psychoactive substance dependence, in remission: Secondary | ICD-10-CM | POA: Insufficient documentation

## 2019-11-14 ENCOUNTER — Ambulatory Visit (AMBULATORY_SURGERY_CENTER): Payer: 59 | Admitting: *Deleted

## 2019-11-14 ENCOUNTER — Other Ambulatory Visit: Payer: Self-pay

## 2019-11-14 VITALS — Temp 96.2°F | Ht 63.5 in | Wt 118.8 lb

## 2019-11-14 DIAGNOSIS — Z1159 Encounter for screening for other viral diseases: Secondary | ICD-10-CM

## 2019-11-14 DIAGNOSIS — Z86010 Personal history of colonic polyps: Secondary | ICD-10-CM

## 2019-11-14 MED ORDER — NA SULFATE-K SULFATE-MG SULF 17.5-3.13-1.6 GM/177ML PO SOLN: 1.0000 | Freq: Once | ORAL | 0 refills | Status: AC

## 2019-11-14 NOTE — Progress Notes (Signed)

## 2019-11-16 ENCOUNTER — Telehealth: Payer: Self-pay | Admitting: Family Medicine

## 2019-11-16 ENCOUNTER — Ambulatory Visit (INDEPENDENT_AMBULATORY_CARE_PROVIDER_SITE_OTHER): Payer: 59

## 2019-11-16 DIAGNOSIS — Z1159 Encounter for screening for other viral diseases: Secondary | ICD-10-CM

## 2019-11-16 LAB — SARS CORONAVIRUS 2 (TAT 6-24 HRS): SARS Coronavirus 2: NEGATIVE

## 2019-11-16 NOTE — Telephone Encounter (Signed)
Patients daughter called pt has had 3 er visits for passing out . Put her on 1st availablle HFU slot 11/26/2019  At 4:00/  Patient just wanted to [place] would like a call back if possible

## 2019-11-20 ENCOUNTER — Other Ambulatory Visit: Payer: Self-pay

## 2019-11-20 ENCOUNTER — Encounter: Payer: Self-pay | Admitting: Gastroenterology

## 2019-11-20 ENCOUNTER — Ambulatory Visit (AMBULATORY_SURGERY_CENTER): Payer: 59 | Admitting: Gastroenterology

## 2019-11-20 VITALS — BP 169/106 | HR 81 | Temp 98.5°F | Resp 15 | Ht 63.0 in | Wt 118.0 lb

## 2019-11-20 DIAGNOSIS — D120 Benign neoplasm of cecum: Secondary | ICD-10-CM

## 2019-11-20 DIAGNOSIS — Z86010 Personal history of colonic polyps: Secondary | ICD-10-CM | POA: Diagnosis not present

## 2019-11-20 DIAGNOSIS — D125 Benign neoplasm of sigmoid colon: Secondary | ICD-10-CM

## 2019-11-20 DIAGNOSIS — K635 Polyp of colon: Secondary | ICD-10-CM

## 2019-11-20 MED ORDER — SODIUM CHLORIDE 0.9 % IV SOLN: 500.0000 mL | Freq: Once | INTRAVENOUS | Status: DC

## 2019-11-20 NOTE — Progress Notes (Signed)
Report given to PACU, vss 

## 2019-11-20 NOTE — Progress Notes (Signed)
Called to room to assist during endoscopic procedure.  Patient ID and intended procedure confirmed with present staff. Received instructions for my participation in the procedure from the performing physician.  

## 2019-11-20 NOTE — Op Note (Signed)
[facility] Patient Name: Melissa Bridges Procedure Date: 11/20/2019 11:28 AM MRN: [id number] Endoscopist: Viviann Spare P. Adela Lank , MD Age: 58 Referring MD:  Date of Birth: 1961/08/07 Gender: Female Account #: [id number] Procedure:                Colonoscopy Indications:              High risk colon cancer surveillance: Personal                            history of large sessile serrated colon polyp                            removed 3 years ago Medicines:                Monitored Anesthesia Care Procedure:                Pre-Anesthesia Assessment:                           - Prior to the procedure, a History and Physical                            was performed, and patient medications and                            allergies were reviewed. The patient's tolerance of                            previous anesthesia was also reviewed. The risks                            and benefits of the procedure and the sedation                            options and risks were discussed with the patient.                            All questions were answered, and informed consent                            was obtained. Prior Anticoagulants: The patient has                            taken no previous anticoagulant or antiplatelet                            agents. ASA Grade Assessment: II - A patient with                            mild systemic disease. After reviewing the risks                            and benefits, the patient was deemed in  satisfactory condition to undergo the procedure.                           After obtaining informed consent, the colonoscope                            was passed under direct vision. Throughout the                            procedure, the patient's blood pressure, pulse, and                            oxygen saturations were monitored continuously. The                            Colonoscope was introduced through the  anus and                            advanced to the the cecum, identified by                            appendiceal orifice and ileocecal valve. The                            colonoscopy was performed without difficulty. The                            patient tolerated the procedure well. The quality                            of the bowel preparation was good. The ileocecal                            valve, appendiceal orifice, and rectum were                            photographed. Scope In: 11:30:51 AM Scope Out: 11:51:01 AM Scope Withdrawal Time: 0 hours 17 minutes 37 seconds  Total Procedure Duration: 0 hours 20 minutes 10 seconds  Findings:                 The perianal and digital rectal examinations were                            normal.                           A 7 mm polyp was found in the cecum. The polyp was                            sessile. The polyp was removed with a cold snare.                            Resection and retrieval were complete.  A diminutive polyp was found in the sigmoid colon.                            The polyp was sessile. The polyp was removed with a                            cold snare. Resection and retrieval were complete.                           A tattoo was seen in the sigmoid colon. A                            post-polypectomy scar was found at the tattoo site                            and looked healthy.                           Internal hemorrhoids were found during retroflexion.                           The exam was otherwise without abnormality. Complications:            No immediate complications. Estimated blood loss:                            Minimal. Estimated Blood Loss:     Estimated blood loss was minimal. Impression:               - One 7 mm polyp in the cecum, removed with a cold                            snare. Resected and retrieved.                           - One diminutive polyp in the  sigmoid colon,                            removed with a cold snare. Resected and retrieved.                           - A tattoo was seen in the sigmoid colon. A                            post-polypectomy scar was found at the tattoo site.                           - Internal hemorrhoids.                           - The examination was otherwise normal. Recommendation:           - Patient has a contact number available for  emergencies. The signs and symptoms of potential                            delayed complications were discussed with the                            patient. Return to normal activities tomorrow.                            Written discharge instructions were provided to the                            patient.                           - Resume previous diet.                           - Continue present medications.                           - Await pathology results. Viviann Spare P. Kellyann Ordway, MD 11/20/2019 11:55:07 AM This report has been signed electronically.

## 2019-11-20 NOTE — Patient Instructions (Signed)
Handouts given:  Polyps, Hemorrhoids and diverticulosis Resume previous diet Continue current medications     YOU HAD AN ENDOSCOPIC PROCEDURE TODAY AT [facility]:   Refer to the procedure report that was given to you for any specific questions about what was found during the examination.  If the procedure report does not answer your questions, please call your gastroenterologist to clarify.  If you requested that your care partner not be given the details of your procedure findings, then the procedure report has been included in a sealed envelope for you to review at your convenience later.  YOU SHOULD EXPECT: Some feelings of bloating in the abdomen. Passage of more gas than usual.  Walking can help get rid of the air that was put into your GI tract during the procedure and reduce the bloating. If you had a lower endoscopy (such as a colonoscopy or flexible sigmoidoscopy) you may notice spotting of blood in your stool or on the toilet paper. If you underwent a bowel prep for your procedure, you may not have a normal bowel movement for a few days.  Please Note:  You might notice some irritation and congestion in your nose or some drainage.  This is from the oxygen used during your procedure.  There is no need for concern and it should clear up in a day or so.  SYMPTOMS TO REPORT IMMEDIATELY:   Following lower endoscopy (colonoscopy or flexible sigmoidoscopy):  Excessive amounts of blood in the stool  Significant tenderness or worsening of abdominal pains  Swelling of the abdomen that is new, acute  Fever of 100F or higher   For urgent or emergent issues, a gastroenterologist can be reached at any hour by calling [phone].   DIET:  We do recommend a small meal at first, but then you may proceed to your regular diet.  Drink plenty of fluids but you should avoid alcoholic beverages for 24 hours.  ACTIVITY:  You should plan to take it easy for the rest of today and  you should NOT DRIVE or use heavy machinery until tomorrow (because of the sedation medicines used during the test).    FOLLOW UP: Our staff will call the number listed on your records 48-72 hours following your procedure to check on you and address any questions or concerns that you may have regarding the information given to you following your procedure. If we do not reach you, we will leave a message.  We will attempt to reach you two times.  During this call, we will ask if you have developed any symptoms of COVID 19. If you develop any symptoms (ie: fever, flu-like symptoms, shortness of breath, cough etc.) before then, please call [phone].  If you test positive for Covid 19 in the 2 weeks post procedure, please call and report this information to [place].    If any biopsies were taken you will be contacted by phone or by letter within the next 1-3 weeks.  Please call us at [phone] if you have not heard about the biopsies in 3 weeks.    SIGNATURES/CONFIDENTIALITY: You and/or your care partner have signed paperwork which will be entered into your electronic medical record.  These signatures attest to the fact that that the information above on your After Visit Summary has been reviewed and is understood.  Full responsibility of the confidentiality of this discharge information lies with you and/or your care-partner.

## 2019-11-22 ENCOUNTER — Telehealth: Payer: Self-pay

## 2019-11-22 NOTE — Telephone Encounter (Signed)
  Follow up Call-  Call back number 11/20/2019  Post procedure Call Back phone  # [phone]  Permission to leave phone message Yes  Some recent data might be hidden     Patient questions:  Do you have a fever, pain , or abdominal swelling? No. Pain Score  0 *  Have you tolerated food without any problems? Yes.    Have you been able to return to your normal activities? Yes.    Do you have any questions about your discharge instructions: Diet   No. Medications  No. Follow up visit  No.  Do you have questions or concerns about your Care? No.  Actions: * If pain score is 4 or above: No action needed, pain <4.  1. Have you developed a fever since your procedure? no  2.   Have you had an respiratory symptoms (SOB or cough) since your procedure? no  3.   Have you tested positive for COVID 19 since your procedure no  4.   Have you had any family members/close contacts diagnosed with the COVID 19 since your procedure?  no   If yes to any of these questions please route to Joylene John, RN and Alphonsa Gin, Therapist, sports.

## 2019-11-22 NOTE — Telephone Encounter (Signed)
Thank you for the update.  I do not know what else to do at this point. I will discuss with the patient at that appointment.

## 2019-11-23 ENCOUNTER — Ambulatory Visit
Admission: RE | Admit: 2019-11-23 | Discharge: 2019-11-23 | Disposition: A | Discharge status: Home / Self Care | Payer: 59 | Source: Ambulatory Visit | Attending: Family Medicine | Admitting: Family Medicine

## 2019-11-23 ENCOUNTER — Other Ambulatory Visit: Payer: Self-pay

## 2019-11-23 DIAGNOSIS — Z1239 Encounter for other screening for malignant neoplasm of breast: Secondary | ICD-10-CM

## 2019-11-26 ENCOUNTER — Encounter: Payer: Self-pay | Admitting: Family Medicine

## 2019-11-26 ENCOUNTER — Ambulatory Visit (INDEPENDENT_AMBULATORY_CARE_PROVIDER_SITE_OTHER): Payer: 59 | Admitting: Family Medicine

## 2019-11-26 ENCOUNTER — Other Ambulatory Visit: Payer: Self-pay

## 2019-11-26 VITALS — BP 138/84 | HR 60 | Temp 97.7°F | Resp 16 | Ht 63.0 in | Wt 120.6 lb

## 2019-11-26 DIAGNOSIS — R350 Frequency of micturition: Secondary | ICD-10-CM | POA: Diagnosis not present

## 2019-11-26 DIAGNOSIS — R197 Diarrhea, unspecified: Secondary | ICD-10-CM | POA: Diagnosis not present

## 2019-11-26 DIAGNOSIS — R55 Syncope and collapse: Secondary | ICD-10-CM

## 2019-11-26 DIAGNOSIS — I951 Orthostatic hypotension: Secondary | ICD-10-CM | POA: Diagnosis not present

## 2019-11-26 LAB — POCT URINALYSIS DIP (MANUAL ENTRY)
Bilirubin, UA: NEGATIVE
Blood, UA: NEGATIVE
Glucose, UA: NEGATIVE mg/dL
Ketones, POC UA: NEGATIVE mg/dL
Leukocytes, UA: NEGATIVE
Nitrite, UA: NEGATIVE
Protein Ur, POC: NEGATIVE mg/dL
Spec Grav, UA: <=1.005 — AB (ref 1.010–1.025)
Urobilinogen, UA: 0.2 E.U./dL
pH, UA: 6 (ref 5.0–8.0)

## 2019-11-26 NOTE — Progress Notes (Signed)
Established Patient Office Visit  Subjective:  Patient ID: Melissa Bridges, female    DOB: 1961-01-23  Age: 58 y.o. MRN: [id number]  CC:  Chief Complaint  Patient presents with  . passing out 3 ER visits for this issue    pain in back/stomach and left side pain.  Per pt since colonoscopy constant diarrhea and problems with her bladder  . Asthma    experiencing some sob    HPI Melissa Bridges presents for   Pt who is here with her daughter Melissa Bridges  She was in the ER 3 times for fainting. She states that she does not know if she is going to faint.  Her daughter states that her mother is looking pale and weak and tired in her eyes.  She is now staying with her daughter and getting proper food and hydration.    She states that she had a colonoscopy and she also had her teeth pulled and consumes soft foods. Now she is using pepto-bismol. She has not been taking her blood pressure medications.  She states that her blood pressure would drop to low.   Past Medical History:  Diagnosis Date  . Allergy   . Alopecia   . Anxiety   . Arthritis   . Asthma   . Cataract    "mild"  . COPD (chronic obstructive pulmonary disease) ([place])   . Depression   . GERD (gastroesophageal reflux disease)    o cc- takes OTC if needed.  . Glaucoma   . Hepatitis C   . Hypertension    onset age 54.  . Iron deficiency anemia   . Migraine   . Schizoaffective disorder ([place])   . Sciatic pain   . Seizures ([place])    onset in childhood. 06-03-16- per pt 8 months agoGeneralized tonic-clonic.Last seizure couple of months ago- last sz 9-10 months ago per pt 10-12-2016  . Shortness of breath dyspnea   . Substance abuse ([place])   . Ulcer     Past Surgical History:  Procedure Laterality Date  . ABDOMINAL HYSTERECTOMY     ovarian cyst B, cervical dysplasia, fibroids.   Ovaries intact.  [name] BIOPSY  07/11/2018   Procedure: BIOPSY;  Surgeon: Yetta Flock, MD;  Location: [facility];   Service: Gastroenterology;;  . COLONOSCOPY    . COLONOSCOPY W/ BIOPSIES    . DILATION AND CURETTAGE OF UTERUS     x2  . ESOPHAGOGASTRODUODENOSCOPY (EGD) WITH PROPOFOL N/A 07/11/2018   Procedure: ESOPHAGOGASTRODUODENOSCOPY (EGD) WITH PROPOFOL;  Surgeon: Yetta Flock, MD;  Location: [facility];  Service: Gastroenterology;  Laterality: N/A;  . EXPLORATORY LAPAROTOMY     x3  . GYNECOLOGIC CRYOSURGERY     x3  . HOT HEMOSTASIS N/A 07/11/2018   Procedure: HOT HEMOSTASIS (ARGON PLASMA COAGULATION/BICAP);  Surgeon: Yetta Flock, MD;  Location: [facility];  Service: Gastroenterology;  Laterality: N/A;  . MANDIBLE RECONSTRUCTION N/A 06/27/2015   Procedure: REMOVAL MAXILLARY PALATAL TORUS.  REMOVAL MANDIBULAR TORUS AND EXOSTOSIS.  ;  Surgeon: Diona Browner, DDS;  Location: [facility];  Service: Oral Surgery;  Laterality: N/A;  . OVARIAN CYST REMOVAL  2008  . POLYPECTOMY      Family History  Problem Relation Age of Onset  . Diabetes type II Mother   . Hypertension Mother   . Arthritis Mother   . Diabetes Mother   . Mental illness Mother        bipolar  . Diabetes type II Maternal Aunt   .  Hypertension Father   . Heart murmur Father   . Arthritis Father   . Stroke Father   . Alopecia Sister   . Alopecia Brother   . Alopecia Sister   . Mental retardation Sister        depression  . Prostate cancer Paternal Grandfather   . Cancer Maternal Uncle   . Colon cancer Neg Hx   . Esophageal cancer Neg Hx   . Rectal cancer Neg Hx   . Stomach cancer Neg Hx   . Colon polyps Neg Hx     Social History   Socioeconomic History  . Marital status: Widowed    Spouse name: Not on file  . Number of children: 1  . Years of education: Not on file  . Highest education level: Not on file  Occupational History  . Occupation: disabled    Comment: mental illness; seizures  Tobacco Use  . Smoking status: Current Every Day Smoker    Packs/day: 0.33    Years: 44.00    Pack years: 14.52     Types: Cigarettes  . Smokeless tobacco: Never Used  Substance and Sexual Activity  . Alcohol use: Yes    Alcohol/week: 0.0 standard drinks    Comment: 1 pakj every 3 days  . Drug use: Not Currently    Types: Marijuana, Cocaine    Comment: not used in 5 months   . Sexual activity: Not Currently    Birth control/protection: None    Comment: widow  Other Topics Concern  . Not on file  Social History Narrative   Marital status:  Widowed since 2002.  Married x 16 years. + dating.  Moved from [place] to live with daughter in 2013.     Children:  One child/daughter (33); two grandchildren.      Lives: with daughter, grandchildren 2.  Does not drive due to epilepsy.      Employment:  Disability for schizoaffective disorder.      Tobacco: 1 ppd x since 8th grade.      Alcohol:  Social; rare drinking due to seizure medications.  Weekends.       Drugs: none; previous use of marijuana.  Previous iv drug use, cocaine.      Exercise: none      Seatbelt:  100%      Guns: none   Social Determinants of Radio broadcast assistant Strain:   . Difficulty of Paying Living Expenses: Not on file  Food Insecurity:   . Worried About Charity fundraiser in the Last Year: Not on file  . Ran Out of Food in the Last Year: Not on file  Transportation Needs:   . Lack of Transportation (Medical): Not on file  . Lack of Transportation (Non-Medical): Not on file  Physical Activity:   . Days of Exercise per Week: Not on file  . Minutes of Exercise per Session: Not on file  Stress:   . Feeling of Stress : Not on file  Social Connections:   . Frequency of Communication with Friends and Family: Not on file  . Frequency of Social Gatherings with Friends and Family: Not on file  . Attends Religious Services: Not on file  . Active Member of Clubs or Organizations: Not on file  . Attends Archivist Meetings: Not on file  . Marital Status: Not on file  Intimate Partner Violence:   . Fear of  Current or Ex-Partner: Not on file  . Emotionally Abused: Not  on file  . Physically Abused: Not on file  . Sexually Abused: Not on file    Outpatient Medications Prior to Visit  Medication Sig Dispense Refill  . acetaminophen (TYLENOL) 500 MG tablet Take 2,000-2,500 mg by mouth every 4 (four) hours as needed (pain).    [name] albuterol (PROAIR HFA) 108 (90 Base) MCG/ACT inhaler INHALE 2 PUFFS BY MOUTH EVERY 6 HOURS AS NEEDED FOR SHORTNESS OF BREATH (Patient taking differently: Inhale 2 puffs into the lungs every 6 (six) hours as needed for shortness of breath. ) 18 g 11  . Blood Pressure Monitor KIT Dispense one blood pressure monitor to blood pressure 1 kit 0  . budesonide-formoterol (SYMBICORT) 160-4.5 MCG/ACT inhaler Inhale 2 puffs into the lungs 2 (two) times daily. 2 Inhaler 3  . budesonide-formoterol (SYMBICORT) 80-4.5 MCG/ACT inhaler Inhale 2 puffs into the lungs 2 (two) times daily.    . citalopram (CELEXA) 20 MG tablet Take 1 tablet (20 mg total) by mouth daily. 90 tablet 0  . gabapentin (NEURONTIN) 400 MG capsule Take 1 capsule (400 mg total) by mouth 3 (three) times daily. 270 capsule 1  . ipratropium-albuterol (DUONEB) 0.5-2.5 (3) MG/3ML SOLN Take 3 mLs by nebulization every 6 (six) hours as needed (shortness of breath). Reported on 12/02/2015 360 mL 1  . lamoTRIgine (LAMICTAL) 25 MG tablet TAKE 2 TABLETS (50 MG TOTAL) BY MOUTH DAILY 60 tablet 0  . Multiple Vitamin (MULTIVITAMIN WITH MINERALS) TABS tablet Take 1 tablet by mouth daily. 30 tablet 1  . ondansetron (ZOFRAN) 4 MG tablet TAKE 1 TABLET BY MOUTH EVERY 6 HOURS AS NEEDED FOR NAUSEA (Patient taking differently: Take 4 mg by mouth every 6 (six) hours as needed. ) 20 tablet 1  . QUEtiapine (SEROQUEL) 50 MG tablet Take 3 tablets (150 mg total) by mouth at bedtime. 90 tablet 3  . QUEtiapine Fumarate (SEROQUEL XR) 150 MG 24 hr tablet Take 300 mg by mouth at bedtime.    . thiamine 100 MG tablet Take 1 tablet (100 mg total) by mouth daily.  90 tablet 1  . tiZANidine (ZANAFLEX) 4 MG tablet Take one tablet daily (Patient taking differently: Take 4 mg by mouth 3 (three) times daily as needed for muscle spasms. ) 30 tablet 3  . triamcinolone cream (KENALOG) 0.1 % APPLY CREAM EXTERNALLY TWICE DAILY (Patient taking differently: Apply 1 application topically 2 (two) times daily as needed (rash/irritation). ) 30 g 0  . valACYclovir (VALTREX) 1000 MG tablet TAKE 2 TABLETS BY MOUTH AT SYMPTOM ONSET, REPEAT IN 12 HOURS (Patient taking differently: Take 2,000 mg by mouth See admin instructions. Take 2 tablets (2000 mg) by mouth at symptom onset, repeat in 12 hours) 20 tablet 2  . vitamin C (ASCORBIC ACID) 250 MG tablet Take 250 mg by mouth daily.     No facility-administered medications prior to visit.    Allergies  Allergen Reactions  . Fruit & Vegetable Daily [Nutritional Supplements] Shortness Of Breath    Reaction to Aloe  . Iodinated Diagnostic Agents Swelling    Swelling and itching of left side of her face only after CT SI injection(no steroid used)  . Ibuprofen Other (See Comments)    Causes Stomach upset; However, pt can take Meloxicam without incident and takes this at home.   . Aloe Vera Rash  . Aspirin Rash and Other (See Comments)    Stomach upset    ROS Review of Systems Review of Systems  Constitutional: Negative for activity change, appetite change, chills  and fever.  HENT: Negative for congestion, nosebleeds, trouble swallowing and voice change.   Respiratory: Negative for cough, +shortness of breath and wheezing.   Gastrointestinal: +diarrhea, no nausea and vomiting.  Genitourinary: Negative for difficulty urinating, dysuria, flank pain and hematuria.  Musculoskeletal: Negative for back pain, joint swelling and neck pain.  Neurological: + dizziness, + speech difficulty, +light-headedness and numbness.  See HPI. All other review of systems negative.     Objective:    Physical Exam  BP 138/84 (BP Location:  Left Arm, Patient Position: Sitting, Cuff Size: Normal)   Pulse 60   Temp 97.7 F (36.5 C) (Oral)   Resp 16   Ht 5' 3"  (1.6 m)   Wt 120 lb 9.6 oz (54.7 kg)   SpO2 97%   BMI 21.36 kg/m  Wt Readings from Last 3 Encounters:  11/26/19 120 lb 9.6 oz (54.7 kg)  11/20/19 118 lb (53.5 kg)  11/14/19 118 lb 12.8 oz (53.9 kg)   Physical Exam  Constitutional: Oriented to person, place, and time. Appears well-developed and well-nourished.  HENT:  Head: Normocephalic and atraumatic.  Eyes: Conjunctivae and EOM are normal.  Cardiovascular: Normal rate, regular rhythm, normal heart sounds and intact distal pulses.  No murmur heard. Pulmonary/Chest: Effort normal and breath sounds normal. No stridor. No respiratory distress. Has no wheezes.  Neurological: Is alert and oriented to person, place, and time.  Skin: Skin is warm. Capillary refill takes less than 2 seconds.  Psychiatric: Has a normal mood and affect. Behavior is normal. Judgment and thought content normal.    There are no preventive care reminders to display for this patient.  There are no preventive care reminders to display for this patient.  Lab Results  Component Value Date   TSH 0.869 09/05/2019   Lab Results  Component Value Date   WBC 8.3 11/08/2019   HGB 11.7 (L) 11/08/2019   HCT 32.6 (L) 11/08/2019   MCV 106.2 (H) 11/08/2019   PLT 273 11/08/2019   Lab Results  Component Value Date   NA 133 (L) 11/08/2019   K 3.6 11/08/2019   CO2 23 11/08/2019   GLUCOSE 105 (H) 11/08/2019   BUN 9 11/08/2019   CREATININE 1.30 (H) 11/08/2019   BILITOT 0.8 11/08/2019   ALKPHOS 58 11/08/2019   AST 74 (H) 11/08/2019   ALT 46 (H) 11/08/2019   PROT 6.5 11/08/2019   ALBUMIN 3.3 (L) 11/08/2019   CALCIUM 8.7 (L) 11/08/2019   ANIONGAP 9 11/08/2019   Lab Results  Component Value Date   CHOL 203 (H) 09/05/2019   Lab Results  Component Value Date   HDL 91 09/05/2019   Lab Results  Component Value Date   LDLCALC 98 09/05/2019    Lab Results  Component Value Date   TRIG 79 09/05/2019   Lab Results  Component Value Date   CHOLHDL 2.2 09/05/2019   Lab Results  Component Value Date   HGBA1C 5.2 08/29/2019      Assessment & Plan:   Problem List Items Addressed This Visit    None    Visit Diagnoses    Frequency of micturition    -  Primary   Relevant Orders   POCT urine dipstick (Completed)   Syncope and collapse       Hypotension, postural         Syncope: Advised to call Neurology as the way she is fainting I am concerned she is having a seizure Hypotension: remain off bp medication Diarrhea:  She should also take metamucil for diarrhea, reviewed colonoscopy   No orders of the defined types were placed in this encounter.   Follow-up: No follow-ups on file.    Forrest Moron, MD

## 2019-11-26 NOTE — Patient Instructions (Addendum)
Metamucil twice a day to help to form the stool and cut down on diarrhea.  Take metamucil before meals. You can also take imodium for 2-3 days.  Remain off the blood pressure medication unless the blood pressure medication unless blood pressure is greater than 150/100 for more than 2-3 checks  Call Neurology for follow up      If you have lab work done today you will be contacted with your lab results within the next 2 weeks.  If you have not heard from [place] then please contact us. The fastest way to get your results is to register for My Chart.   IF you received an x-ray today, you will receive an invoice from [facility]. Please contact [facility] at [phone] with questions or concerns regarding your invoice.   IF you received labwork today, you will receive an invoice from [facility]. Please contact [facility] at [phone] with questions or concerns regarding your invoice.   Our billing staff will not be able to assist you with questions regarding bills from these companies.  You will be contacted with the lab results as soon as they are available. The fastest way to get your results is to activate your My Chart account. Instructions are located on the last page of this paperwork. If you have not heard from [place] regarding the results in 2 weeks, please contact this office.

## 2019-12-24 ENCOUNTER — Other Ambulatory Visit: Payer: 59

## 2020-01-04 ENCOUNTER — Telehealth: Payer: Self-pay | Admitting: Family Medicine

## 2020-01-04 NOTE — Telephone Encounter (Signed)
Pt requesting her mental health and seizure prescription. Pt also requested medication for back pain.  Preferred pharmacy is [facility] on [address]  Pt has appt on 3/23, but has ran out of meds. Please advise

## 2020-01-07 NOTE — Telephone Encounter (Signed)
Pt is aware waiting on md °

## 2020-01-08 NOTE — Telephone Encounter (Signed)
Spoke with pt and she is requesting flexeril for muscle spasms, pain med for back pain, seroquel 50 mg , depakote 500 mg and would like valium prescribed as her friend shared her valium with her and it worked well for her seizures.  Pt wants seroquel dosing to be channged to 50 mg has current dosage causes her to sleep all time.  Pharmacy-[facility].  Please pt has an upcoming appt with [facility]ings on 03/04/2020

## 2020-01-16 ENCOUNTER — Telehealth: Payer: Self-pay | Admitting: Family Medicine

## 2020-01-16 NOTE — Telephone Encounter (Signed)
Copied from [employer] [id number]. Topic: General - Other >> Jan 16, 2020  2:47 PM Celene Kras wrote: Reason for CRM: Pt called and is requesting to have a list of her medications mailed to her address. Please advise.

## 2020-01-16 NOTE — Telephone Encounter (Signed)
Medication list has been mailed. ?

## 2020-01-17 MED ORDER — QUETIAPINE FUMARATE ER 50 MG PO TB24: 50.0000 mg | ORAL_TABLET | Freq: Every day | ORAL | 3 refills | Status: DC

## 2020-01-17 MED ORDER — TIZANIDINE HCL 4 MG PO TABS: 4.0000 mg | ORAL_TABLET | Freq: Three times a day (TID) | ORAL | 3 refills | Status: DC | PRN

## 2020-01-17 NOTE — Telephone Encounter (Signed)
Please let the patient know that I will not prescribe valium.  I have never prescribed her depakote.  She is on lamictal. She is not on tizanidine for pain not flexeril.    She should call Neurology about her seizures.

## 2020-01-17 NOTE — Telephone Encounter (Signed)
Spoke with and she was given the message from [place] re: she will not prescribe valium or depakote as she has not ever prescribed this for pt. She is on lamictal.  She is on tizanidine for pain not flexeril.  Pt advises she is in pain and needs [facility]in [facility] as the tizanidine is not helping.  She advises she is eating tylenol like candy for pain with no relief.  Advised she would need to call neurology for her seizures.  Pt advises she doesn't remember the number to neurology nor does she remember the dr's name.  I advised I would let dr Nolon Rod know of her request and call her back with her response.  Pt agreeable.

## 2020-01-18 ENCOUNTER — Telehealth: Payer: Self-pay | Admitting: *Deleted

## 2020-01-18 NOTE — Telephone Encounter (Signed)
Copied from [place] [id number] [phone]. Topic: General - Other >> Jan 16, 2020  2:47 PM Celene Kras wrote: Reason for CRM: Pt called and is requesting to have a list of her medications mailed to her address. Please advise. >> Jan 17, 2020 [id number] AM Teresita Madura, RMA wrote: Med list mailed on 01/16/20

## 2020-01-18 NOTE — Telephone Encounter (Signed)
Melissa K. Mailed her med list out on 01/17/20.

## 2020-01-23 NOTE — Telephone Encounter (Signed)
Cameron Sprang, MD Consulting Physician Neurology 01/23/2020 End  01/23/20  Phone: [phone]; Fax: [phone]     She can call Dr. Delice Lesch for Neurology

## 2020-01-24 NOTE — Telephone Encounter (Signed)
Per [facility] lvm pt can call Dr. Ellouise Newer for neurology appt.   Phone number given [phone] and advised to contact office if she has any further questions or concerns.

## 2020-02-09 ENCOUNTER — Other Ambulatory Visit: Payer: Self-pay | Admitting: Family Medicine

## 2020-02-09 NOTE — Telephone Encounter (Signed)
Requested medication (s) are due for refill today: yes  Requested medication (s) are on the active medication list: yes  Last refill: 01/17/2020  Future visit scheduled: yes  Notes to clinic: REQUEST FOR 90 DAYS PRESCRIPTION   Requested Prescriptions  Pending Prescriptions Disp Refills   QUEtiapine (SEROQUEL XR) 50 MG TB24 24 hr tablet [Pharmacy Med Name: QUETIAPINE ER 50 MG TABLET] 90 tablet 2    Sig: TAKE 1 TABLET BY MOUTH EVERYDAY AT BEDTIME      Not Delegated - Psychiatry:  Antipsychotics - Second Generation (Atypical) - quetiapine Failed - 02/09/2020  8:31 AM      Failed - This refill cannot be delegated      Failed - ALT in normal range and within 180 days    ALT  Date Value Ref Range Status  11/08/2019 46 (H) 0 - 44 U/L Final          Failed - AST in normal range and within 180 days    AST  Date Value Ref Range Status  11/08/2019 74 (H) 15 - 41 U/L Final          Passed - Last BP in normal range    BP Readings from Last 1 Encounters:  11/26/19 138/84          Passed - Valid encounter within last 6 months    Recent Outpatient Visits           2 months ago Frequency of micturition   Primary Care at [facility], Zoe A, MD   3 months ago Hypotension, postural   Primary Care at [facility], Arlie Solomons, MD   3 months ago Orthostatic hypotension   Primary Care at [facility], Arlie Solomons, MD   4 months ago Gross hematuria   Primary Care at [facility], Arlie Solomons, MD   5 months ago Medicare annual wellness visit, subsequent   Primary Care at [facility], Arlie Solomons, MD       Future Appointments             In 3 weeks Forrest Moron, MD Primary Care at [place], [facility]

## 2020-03-04 ENCOUNTER — Ambulatory Visit: Payer: 59 | Admitting: Family Medicine

## 2020-03-05 ENCOUNTER — Encounter: Payer: Self-pay | Admitting: Family Medicine

## 2020-03-28 ENCOUNTER — Other Ambulatory Visit: Payer: Self-pay | Admitting: Family Medicine

## 2020-03-28 NOTE — Telephone Encounter (Signed)
Request for changes to Rx- sent for PCP review

## 2020-03-29 NOTE — Telephone Encounter (Signed)
Patient is requesting a refill of the following medications: Requested Prescriptions   Pending Prescriptions Disp Refills  . QUEtiapine (SEROQUEL XR) 50 MG TB24 24 hr tablet [Pharmacy Med Name: QUETIAPINE ER 50 MG TABLET] 90 tablet 2    Sig: TAKE 1 TABLET BY MOUTH EVERYDAY AT BEDTIME    Date of patient request:03/28/2020 Last office visit: 11/26/2019 Date of last refill: 01/17/2020 Last refill amount: 30 with 3 refills Follow up time period per chart: n/a  Spoke with pt advised she has refill at the pharmacy and to check with them and if there are issues with the refill to contact me back.  Pt agreeable

## 2020-03-31 ENCOUNTER — Encounter: Payer: Self-pay | Admitting: Family Medicine

## 2020-04-10 ENCOUNTER — Other Ambulatory Visit: Payer: Self-pay | Admitting: Family Medicine

## 2020-04-10 NOTE — Telephone Encounter (Signed)
Requested  medications are  due for refill today yes  Requested medications are on the active medication list yes  Last refill 2/4  Notes to clinic Not Delegated

## 2020-05-19 ENCOUNTER — Telehealth: Payer: Self-pay | Admitting: Family Medicine

## 2020-05-19 NOTE — Telephone Encounter (Signed)
Can you schedule pt for TOC

## 2020-05-19 NOTE — Telephone Encounter (Signed)
Pt would like a note for her to get out of jury duty. She feels she is not mentally capable to perform this task. Please advise at [phone]. She would like this note faxed to [phone].

## 2020-05-20 NOTE — Telephone Encounter (Signed)
LVMTCB for pt to sch toc

## 2020-05-21 ENCOUNTER — Ambulatory Visit (INDEPENDENT_AMBULATORY_CARE_PROVIDER_SITE_OTHER): Payer: 59 | Admitting: Registered Nurse

## 2020-05-21 ENCOUNTER — Other Ambulatory Visit: Payer: Self-pay

## 2020-05-21 VITALS — BP 142/84 | HR 83 | Temp 98.2°F | Ht 63.5 in | Wt 107.2 lb

## 2020-05-21 DIAGNOSIS — B001 Herpesviral vesicular dermatitis: Secondary | ICD-10-CM

## 2020-05-21 DIAGNOSIS — M2550 Pain in unspecified joint: Secondary | ICD-10-CM

## 2020-05-21 DIAGNOSIS — J431 Panlobular emphysema: Secondary | ICD-10-CM | POA: Diagnosis not present

## 2020-05-21 MED ORDER — VALACYCLOVIR HCL 1 G PO TABS: ORAL_TABLET | ORAL | 2 refills | Status: DC

## 2020-05-21 MED ORDER — IPRATROPIUM-ALBUTEROL 0.5-2.5 (3) MG/3ML IN SOLN: 3.0000 mL | Freq: Four times a day (QID) | RESPIRATORY_TRACT | 1 refills | Status: DC | PRN

## 2020-05-21 MED ORDER — GABAPENTIN 400 MG PO CAPS: 400.0000 mg | ORAL_CAPSULE | Freq: Three times a day (TID) | ORAL | 1 refills | Status: DC

## 2020-05-21 NOTE — Patient Instructions (Signed)
° ° ° °  If you have lab work done today you will be contacted with your lab results within the next 2 weeks.  If you have not heard from us then please contact us. The fastest way to get your results is to register for My Chart. ° ° °IF you received an x-ray today, you will receive an invoice from [facility]. Please contact [facility] at [phone] with questions or concerns regarding your invoice.  ° °IF you received labwork today, you will receive an invoice from [facility]. Please contact [facility] at [phone] with questions or concerns regarding your invoice.  ° °Our billing staff will not be able to assist you with questions regarding bills from these companies. ° °You will be contacted with the lab results as soon as they are available. The fastest way to get your results is to activate your My Chart account. Instructions are located on the last page of this paperwork. If you have not heard from us regarding the results in 2 weeks, please contact this office. °  ° ° ° °

## 2020-06-06 ENCOUNTER — Other Ambulatory Visit: Payer: Self-pay | Admitting: Registered Nurse

## 2020-06-06 ENCOUNTER — Other Ambulatory Visit: Payer: Self-pay

## 2020-06-06 DIAGNOSIS — J431 Panlobular emphysema: Secondary | ICD-10-CM

## 2020-06-06 MED ORDER — ALBUTEROL SULFATE HFA 108 (90 BASE) MCG/ACT IN AERS: INHALATION_SPRAY | RESPIRATORY_TRACT | 3 refills | Status: DC

## 2020-06-06 MED ORDER — BUDESONIDE-FORMOTEROL FUMARATE 160-4.5 MCG/ACT IN AERO: 2.0000 | INHALATION_SPRAY | Freq: Two times a day (BID) | RESPIRATORY_TRACT | 0 refills | Status: DC

## 2020-06-06 NOTE — Telephone Encounter (Signed)
albuterol (PROAIR HFA) 108 (90 Base) MCG/ACT inhaler budesonide-formoterol (SYMBICORT) 160-4.5 MCG/ACT inhaler  Patient called requesting for either one of these inhalers due to her not being able to use the nebulizer when she is on the go   Please advise

## 2020-07-23 ENCOUNTER — Encounter: Payer: Self-pay | Admitting: Registered Nurse

## 2020-07-23 ENCOUNTER — Ambulatory Visit (INDEPENDENT_AMBULATORY_CARE_PROVIDER_SITE_OTHER): Payer: 59 | Admitting: Registered Nurse

## 2020-07-23 ENCOUNTER — Other Ambulatory Visit: Payer: Self-pay

## 2020-07-23 VITALS — BP 180/100 | HR 90 | Temp 97.1°F | Resp 18 | Ht 63.5 in | Wt 105.4 lb

## 2020-07-23 DIAGNOSIS — R200 Anesthesia of skin: Secondary | ICD-10-CM

## 2020-07-23 DIAGNOSIS — G8929 Other chronic pain: Secondary | ICD-10-CM

## 2020-07-23 DIAGNOSIS — R252 Cramp and spasm: Secondary | ICD-10-CM

## 2020-07-23 DIAGNOSIS — G40909 Epilepsy, unspecified, not intractable, without status epilepticus: Secondary | ICD-10-CM | POA: Diagnosis not present

## 2020-07-23 DIAGNOSIS — R0602 Shortness of breath: Secondary | ICD-10-CM

## 2020-07-23 DIAGNOSIS — R202 Paresthesia of skin: Secondary | ICD-10-CM

## 2020-07-23 DIAGNOSIS — M545 Low back pain, unspecified: Secondary | ICD-10-CM

## 2020-07-23 DIAGNOSIS — Z13220 Encounter for screening for lipoid disorders: Secondary | ICD-10-CM

## 2020-07-23 DIAGNOSIS — E559 Vitamin D deficiency, unspecified: Secondary | ICD-10-CM

## 2020-07-23 DIAGNOSIS — F17200 Nicotine dependence, unspecified, uncomplicated: Secondary | ICD-10-CM

## 2020-07-23 LAB — POCT GLYCOSYLATED HEMOGLOBIN (HGB A1C): Hemoglobin A1C: 5 % (ref 4.0–5.6)

## 2020-07-23 MED ORDER — MONTELUKAST SODIUM 10 MG PO TABS: 10.0000 mg | ORAL_TABLET | Freq: Every day | ORAL | 3 refills | Status: DC

## 2020-07-23 MED ORDER — METHOCARBAMOL 500 MG PO TABS: 500.0000 mg | ORAL_TABLET | Freq: Four times a day (QID) | ORAL | 0 refills | Status: DC

## 2020-07-23 NOTE — Patient Instructions (Signed)
° ° ° °  If you have lab work done today you will be contacted with your lab results within the next 2 weeks.  If you have not heard from us then please contact us. The fastest way to get your results is to register for My Chart. ° ° °IF you received an x-ray today, you will receive an invoice from [facility]. Please contact [facility] at [phone] with questions or concerns regarding your invoice.  ° °IF you received labwork today, you will receive an invoice from [facility]. Please contact [facility] at [phone] with questions or concerns regarding your invoice.  ° °Our billing staff will not be able to assist you with questions regarding bills from these companies. ° °You will be contacted with the lab results as soon as they are available. The fastest way to get your results is to activate your My Chart account. Instructions are located on the last page of this paperwork. If you have not heard from us regarding the results in 2 weeks, please contact this office. °  ° ° ° °

## 2020-07-23 NOTE — Progress Notes (Signed)
Established Patient Office Visit  Subjective:  Patient ID: Melissa Bridges, female    DOB: 1960/12/22  Age: 59 y.o. MRN: [id number]  CC:  Chief Complaint  Patient presents with   Sciatica    Patient states she has been having some back pain and her whole left hand and fingers are numb   Shortness of Breath    Patient states she is having some breathing problems maybe been near mold.    HPI Melissa Bridges presents for lower back pain with a flare of sciatica and shortness of breath  Lower back pain: has been chronic for some time. 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 Occasionally gets flares. Gabapentin 433m PO tid helps - but often times needs a short course of a muscle relaxer to get through a flare. Not in a position right now to see physical therapy. Not interested in neurosurg consult.  Shob: baseline d/t panlobular emphysema, but worse over preceding weeks. States that there is mold in her apartment and the management group is not acting on this. Also notes the poor quality of the air lately has been worsening her symptoms. Unsure if this is allergies. Worsening is only mild - no symptoms of covid or hypoxia.   Also notes that her housing situation is somewhat deteriorating - she lives in assisted living but they harass her about medical appts and hospitalizations. She is interested in exploring other situations but does not know where to start.  No other concerns at this time.   Past Medical History:  Diagnosis Date   Allergy    Alopecia    Anxiety    Arthritis    Asthma    Cataract    "mild"   COPD (chronic obstructive pulmonary disease) (HCC)    Depression    GERD (gastroesophageal reflux disease)    o cc- takes OTC if needed.   Glaucoma    Hepatitis C    Hypertension    onset age 59   Iron deficiency anemia    Migraine    Schizoaffective disorder (HCC)    Sciatic pain    Seizures (HCC)    onset in childhood. 06-03-16- per pt 8  months agoGeneralized tonic-clonic.Last seizure couple of months ago- last sz 9-10 months ago per pt 10-12-2016   Shortness of breath dyspnea    [place]    Ulcer     Past Surgical History:  Procedure Laterality Date   ABDOMINAL HYSTERECTOMY     ovarian cyst B, cervical dysplasia, fibroids.   Ovaries intact.   BIOPSY  07/11/2018   Procedure: BIOPSY;  Surgeon: AYetta Flock MD;  Location: [facility];  Service: Gastroenterology;;   COLONOSCOPY     COLONOSCOPY W/ BIOPSIES     DILATION AND CURETTAGE OF UTERUS     x2   ESOPHAGOGASTRODUODENOSCOPY (EGD) WITH PROPOFOL N/A 07/11/2018   Procedure: ESOPHAGOGASTRODUODENOSCOPY (EGD) WITH PROPOFOL;  Surgeon: AYetta Flock MD;  Location: [facility];  Service: Gastroenterology;  Laterality: N/A;   EXPLORATORY LAPAROTOMY     x3   GYNECOLOGIC CRYOSURGERY     x3   HOT HEMOSTASIS N/A 07/11/2018   Procedure: HOT HEMOSTASIS (ARGON PLASMA COAGULATION/BICAP);  Surgeon: AYetta Flock MD;  [facility];  Service: Gastroenterology;  Laterality: N/A;   MANDIBLE RECONSTRUCTION N/A 06/27/2015   Procedure: REMOVAL MAXILLARY PALATAL TORUS.  REMOVAL MANDIBULAR TORUS AND EXOSTOSIS.  ;  Surgeon: SDiona Browner DDS;  [facility]  Service: Oral Surgery;  Laterality:  N/A;   OVARIAN CYST REMOVAL  2008   POLYPECTOMY      Family History  Problem Relation Age of Onset   Diabetes type II Mother    Hypertension Mother    Arthritis Mother    Diabetes Mother    Mental illness Mother        bipolar   Diabetes type II Maternal Aunt    Hypertension Father    Heart murmur Father    Arthritis Father    Stroke Father    Alopecia Sister    Alopecia Brother    Alopecia Sister    Mental retardation Sister        depression   Prostate cancer Paternal Grandfather    Cancer Maternal Uncle    Colon cancer Neg Hx    Esophageal cancer Neg Hx    Rectal cancer Neg Hx    Stomach cancer Neg Hx     Colon polyps Neg Hx     Social History   Socioeconomic History   Marital status: Widowed    Spouse name: Not on file   Number of children: 1   Years of education: Not on file   Highest education level: Not on file  Occupational History   Occupation: disabled    Comment: mental illness; seizures  Tobacco Use   Smoking status: Current Every Day Smoker    Packs/day: 0.33    Years: 44.00    Pack years: 14.52    Types: Cigarettes   Smokeless tobacco: Never Used  Vaping Use   Vaping Use: Never used  Substance and Sexual Activity   Alcohol use: Yes    Alcohol/week: 0.0 standard drinks    Comment: 1 pakj every 3 days   Drug use: Not Currently    Types: Marijuana, Cocaine    Comment: not used in 5 months    Sexual activity: Not Currently    Birth control/protection: None    Comment: widow  Other Topics Concern   Not on file  Social History Narrative   Marital status:  Widowed since 2002.  Married x 16 years. + dating.  Moved from [place] to live with daughter in 2013.     Children:  One child/daughter (33); two grandchildren.      Lives: with daughter, grandchildren 2.  Does not drive due to epilepsy.      Employment:  Disability for schizoaffective disorder.      Tobacco: 1 ppd x since 8th grade.      Alcohol:  Social; rare drinking due to seizure medications.  Weekends.       Drugs: none; previous use of marijuana.  Previous iv drug use, cocaine.      Exercise: none      Seatbelt:  100%      Guns: none   Social Determinants of Radio broadcast assistant Strain:    Difficulty of Paying Living Expenses:   Food Insecurity:    Worried About Charity fundraiser in the Last Year:    Arboriculturist in the Last Year:   Transportation Needs:    Film/video editor (Medical):    Lack of Transportation (Non-Medical):   Physical Activity:    Days of Exercise per Week:    Minutes of Exercise per Session:   Stress:    Feeling of Stress :     Social Connections:    Frequency of Communication with Friends and Family:    Frequency of Social Gatherings with Friends  and Family:    Attends Religious Services:    Active Member of Clubs or Organizations:    Attends Music therapist:    Marital Status:   Intimate Partner Violence:    Fear of Current or Ex-Partner:    Emotionally Abused:    Physically Abused:    Sexually Abused:     Outpatient Medications Prior to Visit  Medication Sig Dispense Refill   acetaminophen (TYLENOL) 500 MG tablet Take 2,000-2,500 mg by mouth every 4 (four) hours as needed (pain).     albuterol (PROAIR HFA) 108 (90 Base) MCG/ACT inhaler INHALE 2 PUFFS BY MOUTH EVERY 6 HOURS AS NEEDED FOR SHORTNESS OF BREATH 18 g 3   Blood Pressure Monitor KIT Dispense one blood pressure monitor to blood pressure 1 kit 0   budesonide-formoterol (SYMBICORT) 160-4.5 MCG/ACT inhaler Inhale 2 puffs into the lungs 2 (two) times daily. 2 Inhaler 0   budesonide-formoterol (SYMBICORT) 80-4.5 MCG/ACT inhaler Inhale 2 puffs into the lungs 2 (two) times daily.     citalopram (CELEXA) 20 MG tablet Take 1 tablet (20 mg total) by mouth daily. 90 tablet 0   gabapentin (NEURONTIN) 400 MG capsule Take 1 capsule (400 mg total) by mouth 3 (three) times daily. 270 capsule 1   ipratropium-albuterol (DUONEB) 0.5-2.5 (3) MG/3ML SOLN Take 3 mLs by nebulization every 6 (six) hours as needed (shortness of breath). Reported on 12/02/2015 360 mL 1   lamoTRIgine (LAMICTAL) 25 MG tablet TAKE 2 TABLETS (50 MG TOTAL) BY MOUTH DAILY 60 tablet 0   Multiple Vitamin (MULTIVITAMIN WITH MINERALS) TABS tablet Take 1 tablet by mouth daily. 30 tablet 1   ondansetron (ZOFRAN) 4 MG tablet TAKE 1 TABLET BY MOUTH EVERY 6 HOURS AS NEEDED FOR NAUSEA (Patient taking differently: Take 4 mg by mouth every 6 (six) hours as needed. ) 20 tablet 1   QUEtiapine Fumarate (SEROQUEL XR) 50 MG TB24 24 hr tablet Take 1 tablet (50 mg total) by mouth  at bedtime. 30 tablet 3   thiamine 100 MG tablet Take 1 tablet (100 mg total) by mouth daily. 90 tablet 1   tiZANidine (ZANAFLEX) 4 MG tablet TAKE 1 TABLET (4 MG TOTAL) BY MOUTH 3 (THREE) TIMES DAILY AS NEEDED FOR MUSCLE SPASMS. 90 tablet 3   triamcinolone cream (KENALOG) 0.1 % APPLY CREAM EXTERNALLY TWICE DAILY (Patient taking differently: Apply 1 application topically 2 (two) times daily as needed (rash/irritation). ) 30 g 0   valACYclovir (VALTREX) 1000 MG tablet TAKE 2 TABLETS BY MOUTH AT SYMPTOM ONSET, REPEAT IN 12 HOURS 20 tablet 2   vitamin C (ASCORBIC ACID) 250 MG tablet Take 250 mg by mouth daily.     No facility-administered medications prior to visit.    Allergies  Allergen Reactions   Fruit & Vegetable Daily [Nutritional Supplements] Shortness Of Breath    Reaction to Aloe   Iodinated Diagnostic Agents Swelling    Swelling and itching of left side of her face only after CT SI injection(no steroid used)   Ibuprofen Other (See Comments)    Causes Stomach upset; However, pt can take Meloxicam without incident and takes this at home.    Aloe Vera Rash   Aspirin Rash and Other (See Comments)    Stomach upset    ROS Review of Systems  Constitutional: Negative.   HENT: Negative.   Eyes: Negative.   Respiratory: Positive for cough and shortness of breath. Negative for apnea, choking, chest tightness, wheezing and stridor.   Cardiovascular: Negative.  Gastrointestinal: Negative.   Endocrine: Negative.   Genitourinary: Negative.   Musculoskeletal: Negative.   Skin: Negative.   Allergic/Immunologic: Negative.   Neurological: Negative.   Hematological: Negative.   Psychiatric/Behavioral: Negative.   All other systems reviewed and are negative.     Objective:    Physical Exam Vitals and nursing note reviewed.  Constitutional:      General: She is not in acute distress.    Appearance: She is well-developed and normal weight. She is not ill-appearing,  toxic-appearing or diaphoretic.  Pulmonary:     Effort: Pulmonary effort is normal.     Breath sounds: Examination of the right-upper field reveals wheezing. Examination of the left-upper field reveals wheezing. Examination of the right-middle field reveals wheezing. Examination of the left-middle field reveals wheezing. Examination of the right-lower field reveals wheezing. Examination of the left-lower field reveals wheezing. Wheezing present. No decreased breath sounds, rhonchi or rales.  Skin:    General: Skin is warm and dry.     Capillary Refill: Capillary refill takes less than 2 seconds.  Neurological:     General: No focal deficit present.     Mental Status: She is alert and oriented to person, place, and time.     Cranial Nerves: No cranial nerve deficit.     Motor: No weakness.  Psychiatric:        Mood and Affect: Mood is anxious.        Behavior: Behavior normal. Behavior is not agitated.     BP (!) 180/100    Pulse 90    Temp (!) 97.1 F (36.2 C) (Temporal)    Resp 18    Ht 5' 3.5" (1.613 m)    Wt 105 lb 6.4 oz (47.8 kg)    SpO2 96%    BMI 18.38 kg/m  Wt Readings from Last 3 Encounters:  07/23/20 105 lb 6.4 oz (47.8 kg)  05/21/20 107 lb 3.2 oz (48.6 kg)  11/26/19 120 lb 9.6 oz (54.7 kg)     Health Maintenance Due  Topic Date Due   INFLUENZA VACCINE  07/13/2020    There are no preventive care reminders to display for this patient.  Lab Results  Component Value Date   TSH 0.869 09/05/2019   Lab Results  Component Value Date   WBC 8.3 11/08/2019   HGB 11.7 (L) 11/08/2019   HCT 32.6 (L) 11/08/2019   MCV 106.2 (H) 11/08/2019   PLT 273 11/08/2019   Lab Results  Component Value Date   NA 133 (L) 11/08/2019   K 3.6 11/08/2019   CO2 23 11/08/2019   GLUCOSE 105 (H) 11/08/2019   BUN 9 11/08/2019   CREATININE 1.30 (H) 11/08/2019   BILITOT 0.8 11/08/2019   ALKPHOS 58 11/08/2019   AST 74 (H) 11/08/2019   ALT 46 (H) 11/08/2019   PROT 6.5 11/08/2019    ALBUMIN 3.3 (L) 11/08/2019   CALCIUM 8.7 (L) 11/08/2019   ANIONGAP 9 11/08/2019   Lab Results  Component Value Date   CHOL 203 (H) 09/05/2019   Lab Results  Component Value Date   HDL 91 09/05/2019   Lab Results  Component Value Date   LDLCALC 98 09/05/2019   Lab Results  Component Value Date   TRIG 79 09/05/2019   Lab Results  Component Value Date   CHOLHDL 2.2 09/05/2019   Lab Results  Component Value Date   HGBA1C 5.0 07/23/2020      Assessment & Plan:   Problem List Items Addressed  This Visit      Nervous and Auditory   Seizure disorder [facility])   Relevant Orders   Comprehensive metabolic panel   CBC With Differential   Lipid panel   TSH   POCT glycosylated hemoglobin (Hb A1C) (Completed)     Other   Smoker   Relevant Orders   Comprehensive metabolic panel   CBC With Differential   Lipid panel   TSH   POCT glycosylated hemoglobin (Hb A1C) (Completed)    Other Visit Diagnoses    Leg cramping    -  Primary   Relevant Orders   Vitamin D, 25-hydroxy   Vitamin B12   Magnesium   Numbness and tingling in left arm       Relevant Orders   Vitamin D, 25-hydroxy   Vitamin B12   Magnesium   Vitamin D deficiency       Relevant Orders   Vitamin D, 25-hydroxy   Lipid screening       Relevant Orders   Lipid panel      No orders of the defined types were placed in this encounter.   Follow-up: No follow-ups on file.   PLAN  Course of robaxin for sciatica flare up  Montelukast for worsening breathing. If no improvement in 1-2 weeks, return to clinic  Labs collected - will double check on vit d, b12, mag for any reason for worsening pain  Patient encouraged to call clinic with any questions, comments, or concerns.  Maximiano Coss, NP

## 2020-07-24 ENCOUNTER — Encounter: Payer: Self-pay | Admitting: Registered Nurse

## 2020-07-24 LAB — CBC WITH DIFFERENTIAL
Basophils Absolute: 0 10*3/uL (ref 0.0–0.2)
Basos: 1 %
EOS (ABSOLUTE): 0 10*3/uL (ref 0.0–0.4)
Eos: 1 %
Hematocrit: 42.2 % (ref 34.0–46.6)
Hemoglobin: 14.7 g/dL (ref 11.1–15.9)
Immature Grans (Abs): 0 10*3/uL (ref 0.0–0.1)
Immature Granulocytes: 0 %
Lymphocytes Absolute: 1.5 10*3/uL (ref 0.7–3.1)
Lymphs: 28 %
MCH: 36.9 pg — ABNORMAL HIGH (ref 26.6–33.0)
MCHC: 34.8 g/dL (ref 31.5–35.7)
MCV: 106 fL — ABNORMAL HIGH (ref 79–97)
Monocytes Absolute: 0.5 10*3/uL (ref 0.1–0.9)
Monocytes: 9 %
Neutrophils Absolute: 3.4 10*3/uL (ref 1.4–7.0)
Neutrophils: 61 %
RBC: 3.98 x10E6/uL (ref 3.77–5.28)
RDW: 12.1 % (ref 11.7–15.4)
WBC: 5.5 10*3/uL (ref 3.4–10.8)

## 2020-07-24 LAB — COMPREHENSIVE METABOLIC PANEL
ALT: 57 IU/L — ABNORMAL HIGH (ref 0–32)
AST: 84 IU/L — ABNORMAL HIGH (ref 0–40)
Albumin/Globulin Ratio: 1.5 (ref 1.2–2.2)
Albumin: 5 g/dL — ABNORMAL HIGH (ref 3.8–4.8)
Alkaline Phosphatase: 83 IU/L (ref 48–121)
BUN/Creatinine Ratio: 6 — ABNORMAL LOW (ref 12–28)
BUN: 7 mg/dL — ABNORMAL LOW (ref 8–27)
Bilirubin Total: 0.8 mg/dL (ref 0.0–1.2)
CO2: 24 mmol/L (ref 20–29)
Calcium: 10.4 mg/dL — ABNORMAL HIGH (ref 8.7–10.3)
Chloride: 99 mmol/L (ref 96–106)
Creatinine, Ser: 1.16 mg/dL — ABNORMAL HIGH (ref 0.57–1.00)
GFR calc Af Amer: 59 mL/min/{1.73_m2} — ABNORMAL LOW (ref >59)
GFR calc non Af Amer: 51 mL/min/{1.73_m2} — ABNORMAL LOW (ref >59)
Globulin, Total: 3.3 g/dL (ref 1.5–4.5)
Glucose: 110 mg/dL — ABNORMAL HIGH (ref 65–99)
Potassium: 3.9 mmol/L (ref 3.5–5.2)
Sodium: 140 mmol/L (ref 134–144)
Total Protein: 8.3 g/dL (ref 6.0–8.5)

## 2020-07-24 LAB — LIPID PANEL
Chol/HDL Ratio: 2.5 ratio (ref 0.0–4.4)
Cholesterol, Total: 219 mg/dL — ABNORMAL HIGH (ref 100–199)
HDL: 88 mg/dL (ref >39)
LDL Chol Calc (NIH): 116 mg/dL — ABNORMAL HIGH (ref 0–99)
Triglycerides: 86 mg/dL (ref 0–149)
VLDL Cholesterol Cal: 15 mg/dL (ref 5–40)

## 2020-07-24 LAB — MAGNESIUM: Magnesium: 1.6 mg/dL (ref 1.6–2.3)

## 2020-07-24 LAB — TSH: TSH: 0.939 u[IU]/mL (ref 0.450–4.500)

## 2020-07-24 LAB — VITAMIN D 25 HYDROXY (VIT D DEFICIENCY, FRACTURES): Vit D, 25-Hydroxy: 48.2 ng/mL (ref 30.0–100.0)

## 2020-07-24 LAB — VITAMIN B12: Vitamin B-12: 487 pg/mL (ref 232–1245)

## 2020-07-31 ENCOUNTER — Other Ambulatory Visit: Payer: Self-pay | Admitting: Registered Nurse

## 2020-07-31 DIAGNOSIS — J431 Panlobular emphysema: Secondary | ICD-10-CM

## 2020-08-05 ENCOUNTER — Ambulatory Visit (INDEPENDENT_AMBULATORY_CARE_PROVIDER_SITE_OTHER): Payer: 59 | Admitting: Registered Nurse

## 2020-08-05 ENCOUNTER — Encounter: Payer: Self-pay | Admitting: Registered Nurse

## 2020-08-05 ENCOUNTER — Other Ambulatory Visit: Payer: Self-pay

## 2020-08-05 VITALS — BP 164/93 | HR 100 | Temp 98.1°F | Resp 18 | Ht 63.5 in | Wt 110.8 lb

## 2020-08-05 DIAGNOSIS — Z1152 Encounter for screening for COVID-19: Secondary | ICD-10-CM | POA: Diagnosis not present

## 2020-08-05 DIAGNOSIS — M25559 Pain in unspecified hip: Secondary | ICD-10-CM | POA: Diagnosis not present

## 2020-08-05 DIAGNOSIS — Z1159 Encounter for screening for other viral diseases: Secondary | ICD-10-CM | POA: Diagnosis not present

## 2020-08-05 DIAGNOSIS — G40909 Epilepsy, unspecified, not intractable, without status epilepticus: Secondary | ICD-10-CM

## 2020-08-05 DIAGNOSIS — M25512 Pain in left shoulder: Secondary | ICD-10-CM

## 2020-08-05 MED ORDER — TRAMADOL HCL 50 MG PO TABS: 50.0000 mg | ORAL_TABLET | Freq: Three times a day (TID) | ORAL | 0 refills | Status: AC | PRN

## 2020-08-05 NOTE — Progress Notes (Signed)
Established Patient Office Visit  Subjective:  Patient ID: Melissa Bridges, female    DOB: Apr 21, 1961  Age: 59 y.o. MRN: [id number]  CC:  Chief Complaint  Patient presents with  . Transitions Of Care    Patient states she is here for a transfer of care and also having some pain on the left side and finger tips going numb.    HPI Melissa Bridges presents for [facility]  Formerly a patient of Dr. Nolon Rod. Has been seen by myself a number of times previously and I am familiar with this patient.  Having ongoing shoulder and hip pain on L side. States that she has had baseline chronic pain for a while due to orthopedic issues for some time - but this is worse than baseline and has continued to worsen in the preceding weeks. No limits to ROM. Pain does not seem to be positionally worse. Does not want pain medication, would rather be seen by an orthopedic specialist for treatment of root cause.  Hx of seizures that have become less frequent and less intense in preceding months/years. Hoping to get reestablished with Dr. Delice Lesch, who had managed her before.   Requesting HIV test, COVID ab test due to potential past exposure, and Hep C test.  No other concerns at this time, feeling well.  Past Medical History:  Diagnosis Date  . Allergy   . Alopecia   . Anxiety   . Arthritis   . Asthma   . Cataract    "mild"  . COPD (chronic obstructive pulmonary disease) ([place])   . Depression   . GERD (gastroesophageal reflux disease)    o cc- takes OTC if needed.  . Glaucoma   . Hepatitis C   . Hypertension    onset age 49.  . Iron deficiency anemia   . Migraine   . Schizoaffective disorder ([place])   . Sciatic pain   . Seizures ([place])    onset in childhood. 06-03-16- per pt 8 months agoGeneralized tonic-clonic.Last seizure couple of months ago- last sz 9-10 months ago per pt 10-12-2016  . Shortness of breath dyspnea   . Substance abuse ([place])   . Ulcer     Past Surgical History:    Procedure Laterality Date  . ABDOMINAL HYSTERECTOMY     ovarian cyst B, cervical dysplasia, fibroids.   Ovaries intact.  [name] BIOPSY  07/11/2018   Procedure: BIOPSY;  Surgeon: Yetta Flock, MD;  Location: [facility];  Service: Gastroenterology;;  . COLONOSCOPY    . COLONOSCOPY W/ BIOPSIES    . DILATION AND CURETTAGE OF UTERUS     x2  . ESOPHAGOGASTRODUODENOSCOPY (EGD) WITH PROPOFOL N/A 07/11/2018   Procedure: ESOPHAGOGASTRODUODENOSCOPY (EGD) WITH PROPOFOL;  Surgeon: Yetta Flock, MD;  [facility];  Service: Gastroenterology;  Laterality: N/A;  . EXPLORATORY LAPAROTOMY     x3  . GYNECOLOGIC CRYOSURGERY     x3  . HOT HEMOSTASIS N/A 07/11/2018   Procedure: HOT HEMOSTASIS (ARGON PLASMA COAGULATION/BICAP);  Surgeon: Yetta Flock, MD;  Location: [facility];  Service: Gastroenterology;  Laterality: N/A;  . MANDIBLE RECONSTRUCTION N/A 06/27/2015   Procedure: REMOVAL MAXILLARY PALATAL TORUS.  REMOVAL MANDIBULAR TORUS AND EXOSTOSIS.  ;  Surgeon: Diona Browner, DDS;  Location: [facility];  Service: Oral Surgery;  Laterality: N/A;  . OVARIAN CYST REMOVAL  2008  . POLYPECTOMY      Family History  Problem Relation Age of Onset  . Diabetes type II Mother   . Hypertension  Mother   . Arthritis Mother   . Diabetes Mother   . Mental illness Mother        bipolar  . Diabetes type II Maternal Aunt   . Hypertension Father   . Heart murmur Father   . Arthritis Father   . Stroke Father   . Alopecia Sister   . Alopecia Brother   . Alopecia Sister   . Mental retardation Sister        depression  . Prostate cancer Paternal Grandfather   . Cancer Maternal Uncle   . Colon cancer Neg Hx   . Esophageal cancer Neg Hx   . Rectal cancer Neg Hx   . Stomach cancer Neg Hx   . Colon polyps Neg Hx     Social History   Socioeconomic History  . Marital status: Widowed    Spouse name: Not on file  . Number of children: 1  . Years of education: Not on file  . Highest  education level: Not on file  Occupational History  . Occupation: disabled    Comment: mental illness; seizures  Tobacco Use  . Smoking status: Current Every Day Smoker    Packs/day: 0.33    Years: 44.00    Pack years: 14.52    Types: Cigarettes  . Smokeless tobacco: Never Used  Vaping Use  . Vaping Use: Never used  Substance and Sexual Activity  . Alcohol use: Yes    Alcohol/week: 0.0 standard drinks    Comment: 1 pakj every 3 days  . Drug use: Not Currently    Types: Marijuana, Cocaine    Comment: not used in 5 months   . Sexual activity: Not Currently    Birth control/protection: None    Comment: widow  Other Topics Concern  . Not on file  Social History Narrative   Marital status:  Widowed since 2002.  Married x 16 years. + dating.  Moved from [place] to live with daughter in 2013.     Children:  One child/daughter (33); two grandchildren.      Lives: with daughter, grandchildren 2.  Does not drive due to epilepsy.      Employment:  Disability for schizoaffective disorder.      Tobacco: 1 ppd x since 8th grade.      Alcohol:  Social; rare drinking due to seizure medications.  Weekends.       Drugs: none; previous use of marijuana.  Previous iv drug use, cocaine.      Exercise: none      Seatbelt:  100%      Guns: none   Social Determinants of Radio broadcast assistant Strain:   . Difficulty of Paying Living Expenses: Not on file  Food Insecurity:   . Worried About Charity fundraiser in the Last Year: Not on file  . Ran Out of Food in the Last Year: Not on file  Transportation Needs:   . Lack of Transportation (Medical): Not on file  . Lack of Transportation (Non-Medical): Not on file  Physical Activity:   . Days of Exercise per Week: Not on file  . Minutes of Exercise per Session: Not on file  Stress:   . Feeling of Stress : Not on file  Social Connections:   . Frequency of Communication with Friends and Family: Not on file  . Frequency of Social  Gatherings with Friends and Family: Not on file  . Attends Religious Services: Not on file  . Active Member  of Clubs or Organizations: Not on file  . Attends Archivist Meetings: Not on file  . Marital Status: Not on file  Intimate Partner Violence:   . Fear of Current or Ex-Partner: Not on file  . Emotionally Abused: Not on file  . Physically Abused: Not on file  . Sexually Abused: Not on file    Outpatient Medications Prior to Visit  Medication Sig Dispense Refill  . acetaminophen (TYLENOL) 500 MG tablet Take 2,000-2,500 mg by mouth every 4 (four) hours as needed (pain).    [name] albuterol (PROAIR HFA) 108 (90 Base) MCG/ACT inhaler INHALE 2 PUFFS BY MOUTH EVERY 6 HOURS AS NEEDED FOR SHORTNESS OF BREATH 18 g 3  . Blood Pressure Monitor KIT Dispense one blood pressure monitor to blood pressure 1 kit 0  . budesonide-formoterol (SYMBICORT) 80-4.5 MCG/ACT inhaler Inhale 2 puffs into the lungs 2 (two) times daily.    . citalopram (CELEXA) 20 MG tablet Take 1 tablet (20 mg total) by mouth daily. 90 tablet 0  . gabapentin (NEURONTIN) 400 MG capsule Take 1 capsule (400 mg total) by mouth 3 (three) times daily. 270 capsule 1  . ipratropium-albuterol (DUONEB) 0.5-2.5 (3) MG/3ML SOLN Take 3 mLs by nebulization every 6 (six) hours as needed (shortness of breath). Reported on 12/02/2015 360 mL 1  . lamoTRIgine (LAMICTAL) 25 MG tablet TAKE 2 TABLETS (50 MG TOTAL) BY MOUTH DAILY 60 tablet 0  . methocarbamol (ROBAXIN) 500 MG tablet Take 1 tablet (500 mg total) by mouth 4 (four) times daily. 60 tablet 0  . montelukast (SINGULAIR) 10 MG tablet Take 1 tablet (10 mg total) by mouth at bedtime. 30 tablet 3  . Multiple Vitamin (MULTIVITAMIN WITH MINERALS) TABS tablet Take 1 tablet by mouth daily. 30 tablet 1  . ondansetron (ZOFRAN) 4 MG tablet TAKE 1 TABLET BY MOUTH EVERY 6 HOURS AS NEEDED FOR NAUSEA (Patient taking differently: Take 4 mg by mouth every 6 (six) hours as needed. ) 20 tablet 1  . QUEtiapine  Fumarate (SEROQUEL XR) 50 MG TB24 24 hr tablet Take 1 tablet (50 mg total) by mouth at bedtime. 30 tablet 3  . SYMBICORT 160-4.5 MCG/ACT inhaler TAKE 2 PUFFS BY MOUTH TWICE A DAY 1 each 0  . thiamine 100 MG tablet Take 1 tablet (100 mg total) by mouth daily. 90 tablet 1  . tiZANidine (ZANAFLEX) 4 MG tablet TAKE 1 TABLET (4 MG TOTAL) BY MOUTH 3 (THREE) TIMES DAILY AS NEEDED FOR MUSCLE SPASMS. 90 tablet 3  . triamcinolone cream (KENALOG) 0.1 % APPLY CREAM EXTERNALLY TWICE DAILY (Patient taking differently: Apply 1 application topically 2 (two) times daily as needed (rash/irritation). ) 30 g 0  . valACYclovir (VALTREX) 1000 MG tablet TAKE 2 TABLETS BY MOUTH AT SYMPTOM ONSET, REPEAT IN 12 HOURS 20 tablet 2  . vitamin C (ASCORBIC ACID) 250 MG tablet Take 250 mg by mouth daily.     No facility-administered medications prior to visit.    Allergies  Allergen Reactions  . Fruit & Vegetable Daily [Nutritional Supplements] Shortness Of Breath    Reaction to Aloe  . Iodinated Diagnostic Agents Swelling    Swelling and itching of left side of her face only after CT SI injection(no steroid used)  . Ibuprofen Other (See Comments)    Causes Stomach upset; However, pt can take Meloxicam without incident and takes this at home.   . Aloe Vera Rash  . Aspirin Rash and Other (See Comments)    Stomach upset  ROS Review of Systems  Constitutional: Negative.   HENT: Negative.   Eyes: Negative.   Respiratory: Negative.   Cardiovascular: Negative.   Gastrointestinal: Negative.   Endocrine: Negative.   Genitourinary: Negative.   Musculoskeletal: Positive for arthralgias. Negative for back pain, gait problem, joint swelling, myalgias, neck pain and neck stiffness.  Skin: Negative.   Allergic/Immunologic: Negative.   Neurological: Negative.   Hematological: Negative.   Psychiatric/Behavioral: Negative.   All other systems reviewed and are negative.     Objective:    Physical Exam Vitals and  nursing note reviewed.  Constitutional:      General: She is not in acute distress.    Appearance: Normal appearance. She is normal weight. She is not ill-appearing, toxic-appearing or diaphoretic.  Cardiovascular:     Rate and Rhythm: Normal rate and regular rhythm.  Pulmonary:     Effort: Pulmonary effort is normal. No respiratory distress.  Musculoskeletal:        General: No swelling, tenderness, deformity or signs of injury. Normal range of motion.     Right lower leg: No edema.     Left lower leg: No edema.  Skin:    General: Skin is warm and dry.  Neurological:     General: No focal deficit present.     Mental Status: She is alert and oriented to person, place, and time. Mental status is at baseline.  Psychiatric:        Mood and Affect: Mood normal.        Behavior: Behavior normal.        Thought Content: Thought content normal.        Judgment: Judgment normal.     BP (!) 164/93   Pulse 100   Temp 98.1 F (36.7 C) (Temporal)   Resp 18   Ht 5' 3.5" (1.613 m)   Wt 110 lb 12.8 oz (50.3 kg)   SpO2 96%   BMI 19.32 kg/m  Wt Readings from Last 3 Encounters:  08/05/20 110 lb 12.8 oz (50.3 kg)  07/23/20 105 lb 6.4 oz (47.8 kg)  05/21/20 107 lb 3.2 oz (48.6 kg)     There are no preventive care reminders to display for this patient.  There are no preventive care reminders to display for this patient.  Lab Results  Component Value Date   TSH 0.939 07/23/2020   Lab Results  Component Value Date   WBC 5.5 07/23/2020   HGB 14.7 07/23/2020   HCT 42.2 07/23/2020   MCV 106 (H) 07/23/2020   PLT 273 11/08/2019   Lab Results  Component Value Date   NA 140 07/23/2020   K 3.9 07/23/2020   CO2 24 07/23/2020   GLUCOSE 110 (H) 07/23/2020   BUN 7 (L) 07/23/2020   CREATININE 1.16 (H) 07/23/2020   BILITOT 0.8 07/23/2020   ALKPHOS 83 07/23/2020   AST 84 (H) 07/23/2020   ALT 57 (H) 07/23/2020   PROT 8.3 07/23/2020   ALBUMIN 5.0 (H) 07/23/2020   CALCIUM 10.4 (H)  07/23/2020   ANIONGAP 9 11/08/2019   Lab Results  Component Value Date   CHOL 219 (H) 07/23/2020   Lab Results  Component Value Date   HDL 88 07/23/2020   Lab Results  Component Value Date   LDLCALC 116 (H) 07/23/2020   Lab Results  Component Value Date   TRIG 86 07/23/2020   Lab Results  Component Value Date   CHOLHDL 2.5 07/23/2020   Lab Results  Component Value Date  HGBA1C 5.0 07/23/2020      Assessment & Plan:   Problem List Items Addressed This Visit      Nervous and Auditory   Seizure disorder [facility])   Relevant Orders   Ambulatory referral to Neurology    Other Visit Diagnoses    Encounter for screening for COVID-19    -  Primary   Relevant Orders   SAR CoV2 Serology (COVID 19)AB(IGG)IA   Screening for viral disease       Relevant Orders   HIV antibody (with reflex)   Hepatitis C Antibody   Hip pain       Relevant Medications   traMADol (ULTRAM) 50 MG tablet   Other Relevant Orders   Ambulatory referral to Orthopedic Surgery   Pain in joint of left shoulder       Relevant Medications   traMADol (ULTRAM) 50 MG tablet   Other Relevant Orders   Ambulatory referral to Orthopedic Surgery      Meds ordered this encounter  Medications  . traMADol (ULTRAM) 50 MG tablet    Sig: Take 1 tablet (50 mg total) by mouth every 8 (eight) hours as needed for up to 5 days.    Dispense:  15 tablet    Refill:  0    Order Specific Question:   Supervising Provider    Answer:   Carlota Raspberry, JEFFREY R [[id number]]    Follow-up: No follow-ups on file.   PLAN  Refer to ortho for hip and shoulder pain  Refer to Dr. Delice Lesch - neurology - for ongoing seizure management.  Tramadol 76m Po tid PRN for bridge in pain relief   Return in 3-6 mos  Patient encouraged to call clinic with any questions, comments, or concerns.  RMaximiano Coss NP

## 2020-08-06 ENCOUNTER — Encounter: Payer: Self-pay | Admitting: Registered Nurse

## 2020-08-06 LAB — HEPATITIS C ANTIBODY: Hep C Virus Ab: >11 s/co ratio — ABNORMAL HIGH (ref 0.0–0.9)

## 2020-08-06 LAB — SAR COV2 SEROLOGY (COVID19)AB(IGG),IA: DiaSorin SARS-CoV-2 Ab, IgG: NEGATIVE

## 2020-08-06 LAB — HIV ANTIBODY (ROUTINE TESTING W REFLEX): HIV Screen 4th Generation wRfx: NONREACTIVE

## 2020-08-14 ENCOUNTER — Ambulatory Visit: Payer: Self-pay

## 2020-08-14 ENCOUNTER — Ambulatory Visit (INDEPENDENT_AMBULATORY_CARE_PROVIDER_SITE_OTHER): Payer: 59 | Admitting: Orthopaedic Surgery

## 2020-08-14 ENCOUNTER — Encounter: Payer: Self-pay | Admitting: Orthopaedic Surgery

## 2020-08-14 VITALS — Ht 63.5 in | Wt 109.6 lb

## 2020-08-14 DIAGNOSIS — M542 Cervicalgia: Secondary | ICD-10-CM | POA: Diagnosis not present

## 2020-08-14 DIAGNOSIS — M25552 Pain in left hip: Secondary | ICD-10-CM

## 2020-08-14 DIAGNOSIS — M545 Low back pain: Secondary | ICD-10-CM | POA: Diagnosis not present

## 2020-08-14 DIAGNOSIS — M25512 Pain in left shoulder: Secondary | ICD-10-CM | POA: Diagnosis not present

## 2020-08-14 DIAGNOSIS — G8929 Other chronic pain: Secondary | ICD-10-CM

## 2020-08-14 MED ORDER — PREDNISONE 10 MG (21) PO TBPK: ORAL_TABLET | ORAL | 0 refills | Status: DC

## 2020-08-14 MED ORDER — METHOCARBAMOL 500 MG PO TABS: 500.0000 mg | ORAL_TABLET | Freq: Two times a day (BID) | ORAL | 0 refills | Status: DC | PRN
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
## 2020-08-15 ENCOUNTER — Ambulatory Visit: Payer: 59 | Admitting: Neurology

## 2020-08-25 ENCOUNTER — Other Ambulatory Visit: Payer: Self-pay | Admitting: Registered Nurse

## 2020-08-25 DIAGNOSIS — J431 Panlobular emphysema: Secondary | ICD-10-CM

## 2020-08-25 DIAGNOSIS — R0602 Shortness of breath: Secondary | ICD-10-CM

## 2020-09-02 ENCOUNTER — Encounter: Payer: Self-pay | Admitting: Registered Nurse

## 2020-09-02 NOTE — Progress Notes (Signed)
Established Patient Office Visit  Subjective:  Patient ID: Melissa Bridges, female    DOB: 01/24/1961  Age: 59 y.o. MRN: [id number]  CC:  Chief Complaint  Patient presents with  . Concerns    Pt stated that she would like a work excuse not to [employer] jury duty bc of her health conditions. She stated that she is in a mental house called the [facility] and she just does not need that extra stress on her.    HPI Melissa Bridges presents for jury duty  Does not feel fit to serve - concerned that both her chronic health conditions and acute concerns with her housing leave her unable to serve adequately as expected.   Otherwise feeling well.   Past Medical History:  Diagnosis Date  . Allergy   . Alopecia   . Anxiety   . Arthritis   . Asthma   . Cataract    "mild"  . COPD (chronic obstructive pulmonary disease) ([place])   . Depression   . GERD (gastroesophageal reflux disease)    o cc- takes OTC if needed.  . Glaucoma   . Hepatitis C   . Hypertension    onset age 30.  . Iron deficiency anemia   . Migraine   . Schizoaffective disorder ([place])   . Sciatic pain   . Seizures ([place])    onset in childhood. 06-03-16- per pt 8 months agoGeneralized tonic-clonic.Last seizure couple of months ago- last sz 9-10 months ago per pt 10-12-2016  . Shortness of breath dyspnea   . Substance abuse ([place])   . Ulcer     Past Surgical History:  Procedure Laterality Date  . ABDOMINAL HYSTERECTOMY     ovarian cyst B, cervical dysplasia, fibroids.   Ovaries intact.  [name] BIOPSY  07/11/2018   Procedure: BIOPSY;  Surgeon: Yetta Flock, MD;  Location: [facility];  Service: Gastroenterology;;  . COLONOSCOPY    . COLONOSCOPY W/ BIOPSIES    . DILATION AND CURETTAGE OF UTERUS     x2  . ESOPHAGOGASTRODUODENOSCOPY (EGD) WITH PROPOFOL N/A 07/11/2018   Procedure: ESOPHAGOGASTRODUODENOSCOPY (EGD) WITH PROPOFOL;  Surgeon: Yetta Flock, MD;  Location: [facility];  Service:  Gastroenterology;  Laterality: N/A;  . EXPLORATORY LAPAROTOMY     x3  . GYNECOLOGIC CRYOSURGERY     x3  . HOT HEMOSTASIS N/A 07/11/2018   Procedure: HOT HEMOSTASIS (ARGON PLASMA COAGULATION/BICAP);  Surgeon: Yetta Flock, MD;  Location: [facility];  Service: Gastroenterology;  Laterality: N/A;  . MANDIBLE RECONSTRUCTION N/A 06/27/2015   Procedure: REMOVAL MAXILLARY PALATAL TORUS.  REMOVAL MANDIBULAR TORUS AND EXOSTOSIS.  ;  Surgeon: Diona Browner, DDS;  Location: [facility];  Service: Oral Surgery;  Laterality: N/A;  . OVARIAN CYST REMOVAL  2008  . POLYPECTOMY      Family History  Problem Relation Age of Onset  . Diabetes type II Mother   . Hypertension Mother   . Arthritis Mother   . Diabetes Mother   . Mental illness Mother        bipolar  . Diabetes type II Maternal Aunt   . Hypertension Father   . Heart murmur Father   . Arthritis Father   . Stroke Father   . Alopecia Sister   . Alopecia Brother   . Alopecia Sister   . Mental retardation Sister        depression  . Prostate cancer Paternal Grandfather   . Cancer Maternal Uncle   . Colon cancer  Neg Hx   . Esophageal cancer Neg Hx   . Rectal cancer Neg Hx   . Stomach cancer Neg Hx   . Colon polyps Neg Hx     Social History   Socioeconomic History  . Marital status: Widowed    Spouse name: Not on file  . Number of children: 1  . Years of education: Not on file  . Highest education level: Not on file  Occupational History  . Occupation: disabled    Comment: mental illness; seizures  Tobacco Use  . Smoking status: Current Every Day Smoker    Packs/day: 0.33    Years: 44.00    Pack years: 14.52    Types: Cigarettes  . Smokeless tobacco: Never Used  Vaping Use  . Vaping Use: Never used  Substance and Sexual Activity  . Alcohol use: Yes    Alcohol/week: 0.0 standard drinks    Comment: 1 pakj every 3 days  . Drug use: Not Currently    Types: Marijuana, Cocaine    Comment: not used in 5 months   . Sexual  activity: Not Currently    Birth control/protection: None    Comment: widow  Other Topics Concern  . Not on file  Social History Narrative   Marital status:  Widowed since 2002.  Married x 16 years. + dating.  Moved from [place] to live with daughter in 2013.     Children:  One child/daughter (33); two grandchildren.      Lives: with daughter, grandchildren 2.  Does not drive due to epilepsy.      Employment:  Disability for schizoaffective disorder.      Tobacco: 1 ppd x since 8th grade.      Alcohol:  Social; rare drinking due to seizure medications.  Weekends.       Drugs: none; previous use of marijuana.  Previous iv drug use, cocaine.      Exercise: none      Seatbelt:  100%      Guns: none   Social Determinants of Radio broadcast assistant Strain:   . Difficulty of Paying Living Expenses: Not on file  Food Insecurity:   . Worried About Charity fundraiser in the Last Year: Not on file  . Ran Out of Food in the Last Year: Not on file  Transportation Needs:   . Lack of Transportation (Medical): Not on file  . Lack of Transportation (Non-Medical): Not on file  Physical Activity:   . Days of Exercise per Week: Not on file  . Minutes of Exercise per Session: Not on file  Stress:   . Feeling of Stress : Not on file  Social Connections:   . Frequency of Communication with Friends and Family: Not on file  . Frequency of Social Gatherings with Friends and Family: Not on file  . Attends Religious Services: Not on file  . Active Member of Clubs or Organizations: Not on file  . Attends Archivist Meetings: Not on file  . Marital Status: Not on file  Intimate Partner Violence:   . Fear of Current or Ex-Partner: Not on file  . Emotionally Abused: Not on file  . Physically Abused: Not on file  . Sexually Abused: Not on file    Outpatient Medications Prior to Visit  Medication Sig Dispense Refill  . acetaminophen (TYLENOL) 500 MG tablet Take 2,000-2,500 mg by  mouth every 4 (four) hours as needed (pain).    . Blood Pressure Monitor  KIT Dispense one blood pressure monitor to blood pressure 1 kit 0  . budesonide-formoterol (SYMBICORT) 80-4.5 MCG/ACT inhaler Inhale 2 puffs into the lungs 2 (two) times daily.    . citalopram (CELEXA) 20 MG tablet Take 1 tablet (20 mg total) by mouth daily. 90 tablet 0  . lamoTRIgine (LAMICTAL) 25 MG tablet TAKE 2 TABLETS (50 MG TOTAL) BY MOUTH DAILY 60 tablet 0  . Multiple Vitamin (MULTIVITAMIN WITH MINERALS) TABS tablet Take 1 tablet by mouth daily. 30 tablet 1  . ondansetron (ZOFRAN) 4 MG tablet TAKE 1 TABLET BY MOUTH EVERY 6 HOURS AS NEEDED FOR NAUSEA (Patient taking differently: Take 4 mg by mouth every 6 (six) hours as needed. ) 20 tablet 1  . QUEtiapine Fumarate (SEROQUEL XR) 50 MG TB24 24 hr tablet Take 1 tablet (50 mg total) by mouth at bedtime. 30 tablet 3  . thiamine 100 MG tablet Take 1 tablet (100 mg total) by mouth daily. 90 tablet 1  . tiZANidine (ZANAFLEX) 4 MG tablet TAKE 1 TABLET (4 MG TOTAL) BY MOUTH 3 (THREE) TIMES DAILY AS NEEDED FOR MUSCLE SPASMS. 90 tablet 3  . triamcinolone cream (KENALOG) 0.1 % APPLY CREAM EXTERNALLY TWICE DAILY (Patient taking differently: Apply 1 application topically 2 (two) times daily as needed (rash/irritation). ) 30 g 0  . vitamin C (ASCORBIC ACID) 250 MG tablet Take 250 mg by mouth daily.    [name] albuterol (PROAIR HFA) 108 (90 Base) MCG/ACT inhaler INHALE 2 PUFFS BY MOUTH EVERY 6 HOURS AS NEEDED FOR SHORTNESS OF BREATH (Patient taking differently: Inhale 2 puffs into the lungs every 6 (six) hours as needed for shortness of breath. ) 18 g 11  . budesonide-formoterol (SYMBICORT) 160-4.5 MCG/ACT inhaler Inhale 2 puffs into the lungs 2 (two) times daily. 2 Inhaler 3  . gabapentin (NEURONTIN) 400 MG capsule Take 1 capsule (400 mg total) by mouth 3 (three) times daily. 270 capsule 1  . ipratropium-albuterol (DUONEB) 0.5-2.5 (3) MG/3ML SOLN Take 3 mLs by nebulization every 6 (six) hours as  needed (shortness of breath). Reported on 12/02/2015 360 mL 1  . valACYclovir (VALTREX) 1000 MG tablet TAKE 2 TABLETS BY MOUTH AT SYMPTOM ONSET, REPEAT IN 12 HOURS (Patient taking differently: Take 2,000 mg by mouth See admin instructions. Take 2 tablets (2000 mg) by mouth at symptom onset, repeat in 12 hours) 20 tablet 2   No facility-administered medications prior to visit.    Allergies  Allergen Reactions  . Fruit & Vegetable Daily [Nutritional Supplements] Shortness Of Breath    Reaction to Aloe  . Iodinated Diagnostic Agents Swelling    Swelling and itching of left side of her face only after CT SI injection(no steroid used)  . Ibuprofen Other (See Comments)    Causes Stomach upset; However, pt can take Meloxicam without incident and takes this at home.   . Aloe Vera Rash  . Aspirin Rash and Other (See Comments)    Stomach upset    ROS Review of Systems  Constitutional: Negative.   HENT: Negative.   Eyes: Negative.   Respiratory: Negative.   Cardiovascular: Negative.   Gastrointestinal: Negative.   Genitourinary: Negative.   Musculoskeletal: Negative.   Skin: Negative.   Neurological: Negative.   Psychiatric/Behavioral: Negative.       Objective:    Physical Exam Vitals and nursing note reviewed.  Constitutional:      General: She is not in acute distress.    Appearance: Normal appearance. She is normal weight. She is not  ill-appearing, toxic-appearing or diaphoretic.  Cardiovascular:     Rate and Rhythm: Normal rate and regular rhythm.     Heart sounds: Normal heart sounds. No murmur heard.  No friction rub. No gallop.   Pulmonary:     Effort: Pulmonary effort is normal. No respiratory distress.     Breath sounds: Normal breath sounds. No stridor. No wheezing, rhonchi or rales.  Chest:     Chest wall: No tenderness.  Skin:    General: Skin is warm and dry.  Neurological:     General: No focal deficit present.     Mental Status: She is alert and oriented to  person, place, and time. Mental status is at baseline.  Psychiatric:        Mood and Affect: Mood normal.        Behavior: Behavior normal.        Thought Content: Thought content normal.        Judgment: Judgment normal.     BP (!) 142/84 (BP Location: Left Arm, Patient Position: Sitting, Cuff Size: Normal)   Pulse 83   Temp 98.2 F (36.8 C) (Temporal)   Ht 5' 3.5" (1.613 m)   Wt 107 lb 3.2 oz (48.6 kg)   SpO2 97%   BMI 18.69 kg/m  Wt Readings from Last 3 Encounters:  08/14/20 109 lb 9.6 oz (49.7 kg)  08/05/20 110 lb 12.8 oz (50.3 kg)  07/23/20 105 lb 6.4 oz (47.8 kg)     Health Maintenance Due  Topic Date Due  . COVID-19 Vaccine (1) Never done    There are no preventive care reminders to display for this patient.  Lab Results  Component Value Date   TSH 0.939 07/23/2020   Lab Results  Component Value Date   WBC 5.5 07/23/2020   HGB 14.7 07/23/2020   HCT 42.2 07/23/2020   MCV 106 (H) 07/23/2020   PLT 273 11/08/2019   Lab Results  Component Value Date   NA 140 07/23/2020   K 3.9 07/23/2020   CO2 24 07/23/2020   GLUCOSE 110 (H) 07/23/2020   BUN 7 (L) 07/23/2020   CREATININE 1.16 (H) 07/23/2020   BILITOT 0.8 07/23/2020   ALKPHOS 83 07/23/2020   AST 84 (H) 07/23/2020   ALT 57 (H) 07/23/2020   PROT 8.3 07/23/2020   ALBUMIN 5.0 (H) 07/23/2020   CALCIUM 10.4 (H) 07/23/2020   ANIONGAP 9 11/08/2019   Lab Results  Component Value Date   CHOL 219 (H) 07/23/2020   Lab Results  Component Value Date   HDL 88 07/23/2020   Lab Results  Component Value Date   LDLCALC 116 (H) 07/23/2020   Lab Results  Component Value Date   TRIG 86 07/23/2020   Lab Results  Component Value Date   CHOLHDL 2.5 07/23/2020   Lab Results  Component Value Date   HGBA1C 5.0 07/23/2020      Assessment & Plan:   Problem List Items Addressed This Visit      Respiratory   COPD (chronic obstructive pulmonary disease) ([place]) - Primary   Relevant Medications    ipratropium-albuterol (DUONEB) 0.5-2.5 (3) MG/3ML SOLN    Other Visit Diagnoses    Herpes labialis       Relevant Medications   valACYclovir (VALTREX) 1000 MG tablet   Polyarthralgia       Relevant Medications   gabapentin (NEURONTIN) 400 MG capsule      Meds ordered this encounter  Medications  . valACYclovir (VALTREX) 1000  MG tablet    Sig: TAKE 2 TABLETS BY MOUTH AT SYMPTOM ONSET, REPEAT IN 12 HOURS    Dispense:  20 tablet    Refill:  2  . gabapentin (NEURONTIN) 400 MG capsule    Sig: Take 1 capsule (400 mg total) by mouth 3 (three) times daily.    Dispense:  270 capsule    Refill:  1  . ipratropium-albuterol (DUONEB) 0.5-2.5 (3) MG/3ML SOLN    Sig: Take 3 mLs by nebulization every 6 (six) hours as needed (shortness of breath). Reported on 12/02/2015    Dispense:  360 mL    Refill:  1    Use prior to using symbicort    Follow-up: No follow-ups on file.   PLAN  Written excuse for jury duty - agree that pt should not serve at this time  Refill valtrex, gabapentin, and duoneb  Return prn  Patient encouraged to call clinic with any questions, comments, or concerns.  Maximiano Coss, NP

## 2020-09-03 ENCOUNTER — Telehealth: Payer: Self-pay | Admitting: Registered Nurse

## 2020-09-03 NOTE — Telephone Encounter (Signed)
Pt states the letter Delfino Lovett wrote for her about the steroids on 8/24 needs to be faxed to [facility] Fax: [phone]  She states this needs to be faxed so they  Will not kick her out of the house

## 2020-09-03 NOTE — Telephone Encounter (Signed)
I sent a message to morrow about this and he is going to look into it. Patient was sent a letter on 08/05/2020 but I did not see anything about the steroids mentioned.

## 2020-09-05 ENCOUNTER — Ambulatory Visit: Payer: 59 | Admitting: Registered Nurse

## 2020-09-10 ENCOUNTER — Encounter: Payer: Self-pay | Admitting: Neurology

## 2020-09-11 ENCOUNTER — Telehealth: Payer: Self-pay | Admitting: Registered Nurse

## 2020-09-15 NOTE — Telephone Encounter (Signed)
Please call Melissa at [facility] / wanting to speak to Melissa Bridges about patient needing albuterol and a new nebulizer / we should have received a fax on Thursday    Please reach out to  [place] at  [phone]

## 2020-09-16 NOTE — Telephone Encounter (Signed)
Patient paperwork was signed and faxed back.

## 2020-09-16 NOTE — Telephone Encounter (Signed)
Called and spoke with them they will refax the order to my attn, they need provider sign and date.

## 2020-09-18 ENCOUNTER — Other Ambulatory Visit: Payer: Self-pay | Admitting: Registered Nurse

## 2020-09-18 DIAGNOSIS — J431 Panlobular emphysema: Secondary | ICD-10-CM

## 2020-09-23 ENCOUNTER — Other Ambulatory Visit: Payer: Self-pay | Admitting: Registered Nurse

## 2020-09-23 DIAGNOSIS — M545 Low back pain, unspecified: Secondary | ICD-10-CM

## 2020-09-23 DIAGNOSIS — R252 Cramp and spasm: Secondary | ICD-10-CM

## 2020-10-07 ENCOUNTER — Other Ambulatory Visit: Payer: Self-pay | Admitting: Registered Nurse

## 2020-10-07 NOTE — Telephone Encounter (Signed)
Prescription request rerouted to previous prescriber.

## 2020-10-08 ENCOUNTER — Telehealth (INDEPENDENT_AMBULATORY_CARE_PROVIDER_SITE_OTHER): Payer: 59 | Admitting: Registered Nurse

## 2020-10-08 ENCOUNTER — Other Ambulatory Visit: Payer: Self-pay

## 2020-10-08 VITALS — Temp 102.0°F

## 2020-10-08 DIAGNOSIS — B001 Herpesviral vesicular dermatitis: Secondary | ICD-10-CM | POA: Diagnosis not present

## 2020-10-08 DIAGNOSIS — M62838 Other muscle spasm: Secondary | ICD-10-CM | POA: Diagnosis not present

## 2020-10-08 DIAGNOSIS — R059 Cough, unspecified: Secondary | ICD-10-CM

## 2020-10-08 DIAGNOSIS — M2550 Pain in unspecified joint: Secondary | ICD-10-CM

## 2020-10-08 DIAGNOSIS — R1114 Bilious vomiting: Secondary | ICD-10-CM

## 2020-10-08 DIAGNOSIS — G40909 Epilepsy, unspecified, not intractable, without status epilepticus: Secondary | ICD-10-CM

## 2020-10-08 MED ORDER — METHOCARBAMOL 500 MG PO TABS: ORAL_TABLET | ORAL | 0 refills | Status: DC

## 2020-10-08 MED ORDER — VALACYCLOVIR HCL 1 G PO TABS: ORAL_TABLET | ORAL | 2 refills | Status: DC

## 2020-10-08 MED ORDER — AZITHROMYCIN 250 MG PO TABS: ORAL_TABLET | ORAL | 0 refills | Status: DC

## 2020-10-08 MED ORDER — AMOXICILLIN-POT CLAVULANATE 875-125 MG PO TABS: 1.0000 | ORAL_TABLET | Freq: Two times a day (BID) | ORAL | 0 refills | Status: DC

## 2020-10-08 MED ORDER — ONDANSETRON HCL 4 MG PO TABS: 4.0000 mg | ORAL_TABLET | Freq: Four times a day (QID) | ORAL | 1 refills | Status: DC | PRN

## 2020-10-08 NOTE — Progress Notes (Signed)
Telemedicine Encounter- SOAP NOTE Established Patient  This telephone encounter was conducted with the patient's (or proxy's) verbal consent via audio telecommunications: yes  Patient was instructed to have this encounter in a suitably private space; and to only have persons present to whom they give permission to participate. In addition, patient identity was confirmed by use of name plus two identifiers (DOB and address).  I discussed the limitations, risks, security and privacy concerns of performing an evaluation and management service by telephone and the availability of in person appointments. I also discussed with the patient that there may be a patient responsible charge related to this service. The patient expressed understanding and agreed to proceed.  I spent a total of 16 minutes talking with the patient or their proxy.  Chief Complaint  Patient presents with   Covid Vaccine     Patient states she went and got the Covid vaccine she has been been having some Diarrhea , SOB , cough and Fever of 102-103. patient states that she has been hurting and has been tyaken everything with no relief.    Subjective   Melissa Bridges is a 58 y.o. established patient. Telephone visit today for cough, chills, fatigue, shob, fever, and aches.  HPI Onset 5 days ago after second COVID injection. Symptoms are stable. Some production with cough. Has been taking tylenol somewhat regularly without effect. States fever has gotten up to 102.   Notes that she felt she had a seizure yesterday - called neuro and cannot get seen until April 2022. Asked about any ongoing neuro symptoms - pt denies all. Denies postictal state after seizure. Denies hitting her head or any cognitive changes.   Patient Active Problem List   Diagnosis Date Noted   Other psychoactive substance dependence, in remission ([place]) 11/11/2019   Orthostatic hypotension    Syncope 10/11/2019   Screening examination for  STD (sexually transmitted disease) 06/20/2019   Vaginal discharge 06/20/2019   Dysuria 06/20/2019   Bacterial vaginosis 06/20/2019   Hematuria of unknown cause 06/20/2019   Acute non-recurrent sinusitis 03/28/2019   Overdose    Melena    Suicidal ideation 07/09/2018   Anemia 07/09/2018   Cocaine abuse ([place]) 05/24/2018   Hypomagnesemia 05/24/2018   Chronic left hip pain 05/24/2018   Hypokalemia 05/23/2018   Tylenol overdose 05/23/2018   Abdominal pain 05/23/2018   COPD (chronic obstructive pulmonary disease) ([place]) 05/23/2018   Serrated adenoma of colon 08/05/2016   Localization-related idiopathic epilepsy and epileptic syndromes with seizures of localized onset, not intractable, without status epilepticus ([place]) 10/16/2015   Lumbar disc herniation 08/07/2015   Seizure disorder ([place]) 09/09/2014   Asthma with acute exacerbation 03/09/2013   Hypertension 03/09/2013   Lower back pain 03/09/2013   Chronic hepatitis C without hepatic coma ([place]) 03/08/2013   Smoker 12/12/2012   Schizoaffective disorder, bipolar type ([place]) 12/12/2012    Past Medical History:  Diagnosis Date   Allergy    Alopecia    Anxiety    Arthritis    Asthma    Cataract    "mild"   COPD (chronic obstructive pulmonary disease) (HCC)    Depression    GERD (gastroesophageal reflux disease)    o cc- takes OTC if needed.   Glaucoma    Hepatitis C    Hypertension    onset age 17.   Iron deficiency anemia    Migraine    Schizoaffective disorder (HCC)    Sciatic pain    Seizures ([place])  onset in childhood. 06-03-16- per pt 8 months agoGeneralized tonic-clonic.Last seizure couple of months ago- last sz 9-10 months ago per pt 10-12-2016   Shortness of breath dyspnea    Substance abuse (H[facility])    Ulcer     Current Outpatient Medications  Medication Sig Dispense Refill   acetaminophen (TYLENOL) 500 MG tablet Take 2,000-2,500 mg by mouth every 4 (four) hours as  needed (pain).     albuterol (PROAIR HFA) 108 (90 Base) MCG/ACT inhaler INHALE 2 PUFFS BY MOUTH EVERY 6 HOURS AS NEEDED FOR SHORTNESS OF BREATH 18 g 3   Blood Pressure Monitor KIT Dispense one blood pressure monitor to blood pressure 1 kit 0   budesonide-formoterol (SYMBICORT) 80-4.5 MCG/ACT inhaler Inhale 2 puffs into the lungs 2 (two) times daily.     citalopram (CELEXA) 20 MG tablet Take 1 tablet (20 mg total) by mouth daily. 90 tablet 0   gabapentin (NEURONTIN) 400 MG capsule Take 1 capsule (400 mg total) by mouth 3 (three) times daily. 270 capsule 1   ipratropium-albuterol (DUONEB) 0.5-2.5 (3) MG/3ML SOLN Take 3 mLs by nebulization every 6 (six) hours as needed (shortness of breath). Reported on 12/02/2015 360 mL 1   lamoTRIgine (LAMICTAL) 25 MG tablet TAKE 2 TABLETS (50 MG TOTAL) BY MOUTH DAILY 60 tablet 0   montelukast (SINGULAIR) 10 MG tablet TAKE 1 TABLET BY MOUTH EVERYDAY AT BEDTIME 90 tablet 1   Multiple Vitamin (MULTIVITAMIN WITH MINERALS) TABS tablet Take 1 tablet by mouth daily. 30 tablet 1   predniSONE (STERAPRED UNI-PAK 21 TAB) 10 MG (21) TBPK tablet Take as directed 21 tablet 0   QUEtiapine Fumarate (SEROQUEL XR) 50 MG TB24 24 hr tablet Take 1 tablet (50 mg total) by mouth at bedtime. 30 tablet 3   SYMBICORT 160-4.5 MCG/ACT inhaler TAKE 2 PUFFS BY MOUTH TWICE A DAY 10.2 each 1   thiamine 100 MG tablet Take 1 tablet (100 mg total) by mouth daily. 90 tablet 1   tiZANidine (ZANAFLEX) 4 MG tablet TAKE 1 TABLET (4 MG TOTAL) BY MOUTH 3 (THREE) TIMES DAILY AS NEEDED FOR MUSCLE SPASMS. 90 tablet 3   triamcinolone cream (KENALOG) 0.1 % APPLY CREAM EXTERNALLY TWICE DAILY (Patient taking differently: Apply 1 application topically 2 (two) times daily as needed (rash/irritation). ) 30 g 0   vitamin C (ASCORBIC ACID) 250 MG tablet Take 250 mg by mouth daily.     azithromycin (ZITHROMAX) 250 MG tablet Take 2 tablets on first day, then take 1 tab daily. Finish entire supply. 6  tablet 0   methocarbamol (ROBAXIN) 500 MG tablet TAKE 1 TABLET (500 MG TOTAL) BY MOUTH 4 (FOUR) TIMES DAILY. 60 tablet 0   ondansetron (ZOFRAN) 4 MG tablet Take 1 tablet (4 mg total) by mouth every 6 (six) hours as needed. for nausea 20 tablet 1   valACYclovir (VALTREX) 1000 MG tablet TAKE 2 TABLETS BY MOUTH AT SYMPTOM ONSET, REPEAT IN 12 HOURS 20 tablet 2   No current facility-administered medications for this visit.    Allergies  Allergen Reactions   Fruit & Vegetable Daily [Nutritional Supplements] Shortness Of Breath    Reaction to Aloe   Iodinated Diagnostic Agents Swelling    Swelling and itching of left side of her face only after CT SI injection(no steroid used)   Ibuprofen Other (See Comments)    Causes Stomach upset; However, pt can take Meloxicam without incident and takes this at home.    Aloe Vera Rash   Aspirin Rash and  Other (See Comments)    Stomach upset    Social History   Socioeconomic History   Marital status: Widowed    Spouse name: Not on file   Number of children: 1   Years of education: Not on file   Highest education level: Not on file  Occupational History   Occupation: disabled    Comment: mental illness; seizures  Tobacco Use   Smoking status: Current Every Day Smoker    Packs/day: 0.33    Years: 44.00    Pack years: 14.52    Types: Cigarettes   Smokeless tobacco: Never Used  Vaping Use   Vaping Use: Never used  Substance and Sexual Activity   Alcohol use: Yes    Alcohol/week: 0.0 standard drinks    Comment: 1 pakj every 3 days   Drug use: Not Currently    Types: Marijuana, Cocaine    Comment: not used in 5 months    Sexual activity: Not Currently    Birth control/protection: None    Comment: widow  Other Topics Concern   Not on file  Social History Narrative   Marital status:  Widowed since 2002.  Married x 16 years. + dating.  Moved from [place] to live with daughter in 2013.     Children:  One  child/daughter (33); two grandchildren.      Lives: with daughter, grandchildren 2.  Does not drive due to epilepsy.      Employment:  Disability for schizoaffective disorder.      Tobacco: 1 ppd x since 8th grade.      Alcohol:  Social; rare drinking due to seizure medications.  Weekends.       Drugs: none; previous use of marijuana.  Previous iv drug use, cocaine.      Exercise: none      Seatbelt:  100%      Guns: none   Social Determinants of Radio broadcast assistant Strain:    Difficulty of Paying Living Expenses: Not on file  Food Insecurity:    Worried About Charity fundraiser in the Last Year: Not on file   [employer] of Food in the Last Year: Not on file  Transportation Needs:    Lack of Transportation (Medical): Not on file   Lack of Transportation (Non-Medical): Not on file  Physical Activity:    Days of Exercise per Week: Not on file   Minutes of Exercise per Session: Not on file  Stress:    Feeling of Stress : Not on file  Social Connections:    Frequency of Communication with Friends and Family: Not on file   Frequency of Social Gatherings with Friends and Family: Not on file   Attends Religious Services: Not on file   Active Member of Clubs or Organizations: Not on file   Attends Archivist Meetings: Not on file   Marital Status: Not on file  Intimate Partner Violence:    Fear of Current or Ex-Partner: Not on file   Emotionally Abused: Not on file   Physically Abused: Not on file   Sexually Abused: Not on file    ROS Per hpi  Objective   Vitals as reported by the patient: Today's Vitals   10/08/20 1134  Temp: (!) 54 F (38.9 C)    Buffey was seen today for covid vaccine .  Diagnoses and all orders for this visit:  Cough -     Discontinue: azithromycin (ZITHROMAX) 250 MG tablet; Take 2  tablets on first day, then take 1 tab daily. Finish entire supply. -     amoxicillin-clavulanate (AUGMENTIN) 875-125 MG tablet; Take  1 tablet by mouth 2 (two) times daily.  Herpes labialis -     valACYclovir (VALTREX) 1000 MG tablet; TAKE 2 TABLETS BY MOUTH AT SYMPTOM ONSET, REPEAT IN 12 HOURS  Polyarthralgia  Muscle spasm -     methocarbamol (ROBAXIN) 500 MG tablet; TAKE 1 TABLET (500 MG TOTAL) BY MOUTH 4 (FOUR) TIMES DAILY.  Bilious vomiting with nausea -     ondansetron (ZOFRAN) 4 MG tablet; Take 1 tablet (4 mg total) by mouth every 6 (six) hours as needed. for nausea   PLAN  Refill zofran and valtrex per pt request  Refill robaxin for muscle aches  Will give 7 days of augmentin po bid for lower respiratory infection. While typically would prefer z pack for CAP / COVID PNA, will avoid to prevent lowering of seizure threshold.   Discussed ER precautions  Will refer to neuro to see if we can have her seen sooner than April  Patient encouraged to call clinic with any questions, comments, or concerns.  I discussed the assessment and treatment plan with the patient. The patient was provided an opportunity to ask questions and all were answered. The patient agreed with the plan and demonstrated an understanding of the instructions.   The patient was advised to call back or seek an in-person evaluation if the symptoms worsen or if the condition fails to improve as anticipated.  I provided 16 minutes of non-face-to-face time during this encounter.  Maximiano Coss, NP  Primary Care at [facility]

## 2020-10-08 NOTE — Patient Instructions (Signed)
° ° ° °  If you have lab work done today you will be contacted with your lab results within the next 2 weeks.  If you have not heard from us then please contact us. The fastest way to get your results is to register for My Chart. ° ° °IF you received an x-ray today, you will receive an invoice from [facility]. Please contact [facility] at [phone] with questions or concerns regarding your invoice.  ° °IF you received labwork today, you will receive an invoice from [facility]. Please contact [facility] at [phone] with questions or concerns regarding your invoice.  ° °Our billing staff will not be able to assist you with questions regarding bills from these companies. ° °You will be contacted with the lab results as soon as they are available. The fastest way to get your results is to activate your My Chart account. Instructions are located on the last page of this paperwork. If you have not heard from us regarding the results in 2 weeks, please contact this office. °  ° ° ° °

## 2020-10-15 ENCOUNTER — Other Ambulatory Visit: Payer: Self-pay | Admitting: Registered Nurse

## 2020-10-15 DIAGNOSIS — J431 Panlobular emphysema: Secondary | ICD-10-CM

## 2020-10-22 ENCOUNTER — Emergency Department (HOSPITAL_COMMUNITY)
Admission: EM | Admit: 2020-10-22 | Discharge: 2020-10-22 | Disposition: A | Discharge status: Home / Self Care | Payer: 59 | Attending: Emergency Medicine | Admitting: Emergency Medicine

## 2020-10-22 ENCOUNTER — Telehealth: Payer: Self-pay | Admitting: Registered Nurse

## 2020-10-22 ENCOUNTER — Emergency Department (HOSPITAL_COMMUNITY): Payer: 59

## 2020-10-22 ENCOUNTER — Other Ambulatory Visit: Payer: Self-pay

## 2020-10-22 ENCOUNTER — Telehealth (INDEPENDENT_AMBULATORY_CARE_PROVIDER_SITE_OTHER): Payer: 59 | Admitting: Registered Nurse

## 2020-10-22 DIAGNOSIS — F17210 Nicotine dependence, cigarettes, uncomplicated: Secondary | ICD-10-CM | POA: Insufficient documentation

## 2020-10-22 DIAGNOSIS — K2900 Acute gastritis without bleeding: Secondary | ICD-10-CM

## 2020-10-22 DIAGNOSIS — K2970 Gastritis, unspecified, without bleeding: Secondary | ICD-10-CM | POA: Diagnosis not present

## 2020-10-22 DIAGNOSIS — K921 Melena: Secondary | ICD-10-CM | POA: Diagnosis not present

## 2020-10-22 DIAGNOSIS — I10 Essential (primary) hypertension: Secondary | ICD-10-CM | POA: Diagnosis not present

## 2020-10-22 DIAGNOSIS — Z7951 Long term (current) use of inhaled steroids: Secondary | ICD-10-CM | POA: Diagnosis not present

## 2020-10-22 DIAGNOSIS — R195 Other fecal abnormalities: Secondary | ICD-10-CM | POA: Insufficient documentation

## 2020-10-22 DIAGNOSIS — J449 Chronic obstructive pulmonary disease, unspecified: Secondary | ICD-10-CM | POA: Insufficient documentation

## 2020-10-22 DIAGNOSIS — R5383 Other fatigue: Secondary | ICD-10-CM | POA: Diagnosis not present

## 2020-10-22 DIAGNOSIS — Z79899 Other long term (current) drug therapy: Secondary | ICD-10-CM | POA: Insufficient documentation

## 2020-10-22 DIAGNOSIS — T391X1A Poisoning by 4-Aminophenol derivatives, accidental (unintentional), initial encounter: Secondary | ICD-10-CM

## 2020-10-22 DIAGNOSIS — R1013 Epigastric pain: Secondary | ICD-10-CM | POA: Diagnosis present

## 2020-10-22 HISTORY — DX: Unspecified asthma, uncomplicated: J45.909

## 2020-10-22 LAB — URINALYSIS, ROUTINE W REFLEX MICROSCOPIC
Bilirubin Urine: NEGATIVE
Glucose, UA: NEGATIVE mg/dL
Hgb urine dipstick: NEGATIVE
Ketones, ur: NEGATIVE mg/dL
Leukocytes,Ua: NEGATIVE
Nitrite: NEGATIVE
Protein, ur: NEGATIVE mg/dL
Specific Gravity, Urine: 1.011 (ref 1.005–1.030)
pH: 5 (ref 5.0–8.0)

## 2020-10-22 LAB — LIPASE, BLOOD: Lipase: 34 U/L (ref 11–51)

## 2020-10-22 LAB — COMPREHENSIVE METABOLIC PANEL
ALT: 63 U/L — ABNORMAL HIGH (ref 0–44)
AST: 93 U/L — ABNORMAL HIGH (ref 15–41)
Albumin: 4.3 g/dL (ref 3.5–5.0)
Alkaline Phosphatase: 94 U/L (ref 38–126)
Anion gap: 10 (ref 5–15)
BUN: 9 mg/dL (ref 8–23)
CO2: 26 mmol/L (ref 22–32)
Calcium: 9.9 mg/dL (ref 8.9–10.3)
Chloride: 95 mmol/L — ABNORMAL LOW (ref 98–111)
Creatinine, Ser: 0.79 mg/dL (ref 0.44–1.00)
GFR, Estimated: >60 mL/min (ref >60)
Glucose, Bld: 85 mg/dL (ref 70–99)
Potassium: 3.3 mmol/L — ABNORMAL LOW (ref 3.5–5.1)
Sodium: 131 mmol/L — ABNORMAL LOW (ref 135–145)
Total Bilirubin: 0.5 mg/dL (ref 0.3–1.2)
Total Protein: 7.9 g/dL (ref 6.5–8.1)

## 2020-10-22 LAB — CBC
HCT: 38.8 % (ref 36.0–46.0)
Hemoglobin: 13.8 g/dL (ref 12.0–15.0)
MCH: 37.3 pg — ABNORMAL HIGH (ref 26.0–34.0)
MCHC: 35.6 g/dL (ref 30.0–36.0)
MCV: 104.9 fL — ABNORMAL HIGH (ref 80.0–100.0)
Platelets: 159 10*3/uL (ref 150–400)
RBC: 3.7 MIL/uL — ABNORMAL LOW (ref 3.87–5.11)
RDW: 12.1 % (ref 11.5–15.5)
WBC: 6 10*3/uL (ref 4.0–10.5)
nRBC: 0 % (ref 0.0–0.2)

## 2020-10-22 LAB — TYPE AND SCREEN
ABO/RH(D): O POS
Antibody Screen: NEGATIVE

## 2020-10-22 MED ORDER — SODIUM CHLORIDE 0.9 % IV BOLUS
1000.0000 mL | Freq: Once | INTRAVENOUS | Status: AC
  Administered 2020-10-22: 1000 mL via INTRAVENOUS

## 2020-10-22 MED ORDER — ONDANSETRON HCL 4 MG PO TABS: 4.0000 mg | ORAL_TABLET | Freq: Four times a day (QID) | ORAL | 0 refills | Status: AC

## 2020-10-22 MED ORDER — PANTOPRAZOLE SODIUM 20 MG PO TBEC: 20.0000 mg | DELAYED_RELEASE_TABLET | Freq: Every day | ORAL | 0 refills | Status: DC

## 2020-10-22 MED ORDER — PANTOPRAZOLE SODIUM 40 MG IV SOLR
40.0000 mg | Freq: Once | INTRAVENOUS | Status: AC
  Administered 2020-10-22: 40 mg via INTRAVENOUS
  Filled 2020-10-22: qty 40

## 2020-10-22 NOTE — Telephone Encounter (Signed)
Error

## 2020-10-22 NOTE — Patient Instructions (Signed)
° ° ° °  If you have lab work done today you will be contacted with your lab results within the next 2 weeks.  If you have not heard from us then please contact us. The fastest way to get your results is to register for My Chart. ° ° °IF you received an x-ray today, you will receive an invoice from [facility]. Please contact [facility] at [phone] with questions or concerns regarding your invoice.  ° °IF you received labwork today, you will receive an invoice from [facility]. Please contact [facility] at [phone] with questions or concerns regarding your invoice.  ° °Our billing staff will not be able to assist you with questions regarding bills from these companies. ° °You will be contacted with the lab results as soon as they are available. The fastest way to get your results is to activate your My Chart account. Instructions are located on the last page of this paperwork. If you have not heard from us regarding the results in 2 weeks, please contact this office. °  ° ° ° °

## 2020-10-22 NOTE — ED Notes (Signed)
Patient could not have a bowel movement. She states she took imodium before she came to the hospital.

## 2020-10-22 NOTE — Discharge Instructions (Signed)
Follow-up with your doctor next week for recheck 

## 2020-10-22 NOTE — ED Notes (Signed)
D/c no concerns at this time. 

## 2020-10-22 NOTE — Progress Notes (Signed)
Telemedicine Encounter- SOAP NOTE Established Patient  This telephone encounter was conducted with the patient's (or proxy's) verbal consent via audio telecommunications: yes  Patient was instructed to have this encounter in a suitably private space; and to only have persons present to whom they give permission to participate. In addition, patient identity was confirmed by use of name plus two identifiers (DOB and address).  I discussed the limitations, risks, security and privacy concerns of performing an evaluation and management service by telephone and the availability of in person appointments. I also discussed with the patient that there may be a patient responsible charge related to this service. The patient expressed understanding and agreed to proceed.  I spent a total of 26 minutes talking with the patient or their proxy.  Patient at home Provider in office  Chief Complaint  Patient presents with   Covid Vaccine     Patient states she is still feeling very bad. Patient states she has taken everything  but no relief. Per patient her whole left side is giving her trouble her finger tips are numb and changing color , left leg is hard to move. She also is experiencing some diarrhea thats starting to turn black as well.   Medication Refill    Patient states she need Gabapentin and Muscle relaxers.    Subjective   Melissa Bridges is a 59 y.o. established patient. Telephone visit today for ongoing symptoms  HPI Following COVID-19 vaccine patient had left arm pain and weakness, as well as some respiratory symptoms. Through virtual visit, I suspected a respiratory infection likely of bacterial origin with poor timing. Given abx and encouraged to continue to use symbicort and albuterol as instructed.  Pt reports improvement in breathing. However, had noticed some vomiting and watery stools. Did not look at vomit, cannot firmly deny coffee ground emesis. Notes that stool has  been entirely liquid and dark green / black. Pt reports increasing weakness and DOE. States that she's not sure the inhalers are helping as much anymore. Feeling L side weakness and poor circulation through hand and foot on L side. States that following these symptoms she tried some imodium and pepto bismol but these don't help so much  Reports for pain she is taking gabapentin 821m PO tid - twice prescribed dose - and "3, 4, 5" tylenol every 3-4 hours, unclear if regular or extra strength.  Pt denies changes in mental status or cognition. Denies neuro symptoms. Endorses rapidly worsening fatigue.   Does have hx of bleeding ulcers. In past colonoscopies pt has had a number of large polyps removed, all benign.   Patient Active Problem List   Diagnosis Date Noted   Other psychoactive substance dependence, in remission ([facility] 11/11/2019   Orthostatic hypotension    Syncope 10/11/2019   Screening examination for STD (sexually transmitted disease) 06/20/2019   Vaginal discharge 06/20/2019   Dysuria 06/20/2019   Bacterial vaginosis 06/20/2019   Hematuria of unknown cause 06/20/2019   Acute non-recurrent sinusitis 03/28/2019   Overdose    Melena    Suicidal ideation 07/09/2018   Anemia 07/09/2018   Cocaine abuse ([facility] 05/24/2018   Hypomagnesemia 05/24/2018   Chronic left hip pain 05/24/2018   Hypokalemia 05/23/2018   Tylenol overdose 05/23/2018   Abdominal pain 05/23/2018   COPD (chronic obstructive pulmonary disease) ([facility] 05/23/2018   Serrated adenoma of colon 08/05/2016   Localization-related idiopathic epilepsy and epileptic syndromes with seizures of localized onset, not intractable, without status epilepticus ([facility]  10/16/2015   Lumbar disc herniation 08/07/2015   Seizure disorder ([place]) 09/09/2014   Asthma with acute exacerbation 03/09/2013   Hypertension 03/09/2013   Lower back pain 03/09/2013   Chronic hepatitis C without hepatic coma ([place]) 03/08/2013     Smoker 12/12/2012   Schizoaffective disorder, bipolar type ([place]) 12/12/2012    Past Medical History:  Diagnosis Date   Allergy    Alopecia    Anxiety    Arthritis    Asthma    Asthma    [employer] 10/22/2020   Cataract    "mild"   COPD (chronic obstructive pulmonary disease) ([place])    Depression    Depression    [employer] 10/22/2020   GERD (gastroesophageal reflux disease)    o cc- takes OTC if needed.   Glaucoma    Hepatitis C    Hypertension    onset age 84.   Iron deficiency anemia    Migraine    Schizoaffective disorder (HCC)    Sciatic pain    Seizures (HCC)    onset in childhood. 06-03-16- per pt 8 months agoGeneralized tonic-clonic.Last seizure couple of months ago- last sz 9-10 months ago per pt 10-12-2016   Shortness of breath dyspnea    Substance abuse (HCC)    Ulcer     Current Outpatient Medications  Medication Sig Dispense Refill   acetaminophen (TYLENOL) 500 MG tablet Take 2,000-2,500 mg by mouth every 4 (four) hours as needed (pain).     albuterol (PROAIR HFA) 108 (90 Base) MCG/ACT inhaler INHALE 2 PUFFS BY MOUTH EVERY 6 HOURS AS NEEDED FOR SHORTNESS OF BREATH 18 g 3   Blood Pressure Monitor KIT Dispense one blood pressure monitor to blood pressure 1 kit 0   budesonide-formoterol (SYMBICORT) 80-4.5 MCG/ACT inhaler Inhale 2 puffs into the lungs 2 (two) times daily.     citalopram (CELEXA) 20 MG tablet Take 1 tablet (20 mg total) by mouth daily. 90 tablet 0   gabapentin (NEURONTIN) 400 MG capsule Take 1 capsule (400 mg total) by mouth 3 (three) times daily. 270 capsule 1   ipratropium-albuterol (DUONEB) 0.5-2.5 (3) MG/3ML SOLN Take 3 mLs by nebulization every 6 (six) hours as needed (shortness of breath). Reported on 12/02/2015 360 mL 1   lamoTRIgine (LAMICTAL) 25 MG tablet TAKE 2 TABLETS (50 MG TOTAL) BY MOUTH DAILY 60 tablet 0   methocarbamol (ROBAXIN) 500 MG tablet TAKE 1 TABLET (500 MG TOTAL) BY MOUTH 4 (FOUR) TIMES DAILY.  60 tablet 0   montelukast (SINGULAIR) 10 MG tablet TAKE 1 TABLET BY MOUTH EVERYDAY AT BEDTIME 90 tablet 1   Multiple Vitamin (MULTIVITAMIN WITH MINERALS) TABS tablet Take 1 tablet by mouth daily. 30 tablet 1   ondansetron (ZOFRAN) 4 MG tablet Take 1 tablet (4 mg total) by mouth every 6 (six) hours as needed. for nausea 20 tablet 1   predniSONE (STERAPRED UNI-PAK 21 TAB) 10 MG (21) TBPK tablet Take as directed 21 tablet 0   QUEtiapine Fumarate (SEROQUEL XR) 50 MG TB24 24 hr tablet Take 1 tablet (50 mg total) by mouth at bedtime. 30 tablet 3   SYMBICORT 160-4.5 MCG/ACT inhaler TAKE 2 PUFFS BY MOUTH TWICE A DAY 10.2 each 1   thiamine 100 MG tablet Take 1 tablet (100 mg total) by mouth daily. 90 tablet 1   tiZANidine (ZANAFLEX) 4 MG tablet TAKE 1 TABLET (4 MG TOTAL) BY MOUTH 3 (THREE) TIMES DAILY AS NEEDED FOR MUSCLE SPASMS. 90 tablet 3   triamcinolone cream (KENALOG) 0.1 %  APPLY CREAM EXTERNALLY TWICE DAILY (Patient taking differently: Apply 1 application topically 2 (two) times daily as needed (rash/irritation). ) 30 g 0   valACYclovir (VALTREX) 1000 MG tablet TAKE 2 TABLETS BY MOUTH AT SYMPTOM ONSET, REPEAT IN 12 HOURS 20 tablet 2   vitamin C (ASCORBIC ACID) 250 MG tablet Take 250 mg by mouth daily.     No current facility-administered medications for this visit.    Allergies  Allergen Reactions   Fruit & Vegetable Daily [Nutritional Supplements] Shortness Of Breath    Reaction to Aloe   Iodinated Diagnostic Agents Swelling    Swelling and itching of left side of her face only after CT SI injection(no steroid used)   Ibuprofen Other (See Comments)    Causes Stomach upset; However, pt can take Meloxicam without incident and takes this at home.    Aloe Vera Rash   Aspirin Rash and Other (See Comments)    Stomach upset    Social History   Socioeconomic History   Marital status: Widowed    Spouse name: Not on file   Number of children: 1   Years of education: Not on  file   Highest education level: Not on file  Occupational History   Occupation: disabled    Comment: mental illness; seizures  Tobacco Use   Smoking status: Current Every Day Smoker    Packs/day: 0.33    Years: 44.00    Pack years: 14.52    Types: Cigarettes   Smokeless tobacco: Never Used  Vaping Use   Vaping Use: Never used  Substance and Sexual Activity   Alcohol use: Yes    Alcohol/week: 0.0 standard drinks    Comment: 1 pakj every 3 days   Drug use: Not Currently    Types: Marijuana, Cocaine    Comment: not used in 5 months    Sexual activity: Not Currently    Birth control/protection: None    Comment: widow  Other Topics Concern   Not on file  Social History Narrative   Marital status:  Widowed since 2002.  Married x 16 years. + dating.  Moved from [place] to live with daughter in 2013.     Children:  One child/daughter (33); two grandchildren.      Lives: with daughter, grandchildren 2.  Does not drive due to epilepsy.      Employment:  Disability for schizoaffective disorder.      Tobacco: 1 ppd x since 8th grade.      Alcohol:  Social; rare drinking due to seizure medications.  Weekends.       Drugs: none; previous use of marijuana.  Previous iv drug use, cocaine.      Exercise: none      Seatbelt:  100%      Guns: none   Social Determinants of Radio broadcast assistant Strain:    Difficulty of Paying Living Expenses: Not on file  Food Insecurity:    Worried About Charity fundraiser in the Last Year: Not on file   [employer] of Food in the Last Year: Not on file  Transportation Needs:    Lack of Transportation (Medical): Not on file   Lack of Transportation (Non-Medical): Not on file  Physical Activity:    Days of Exercise per Week: Not on file   Minutes of Exercise per Session: Not on file  Stress:    Feeling of Stress : Not on file  Social Connections:    Frequency of Communication  with Friends and Family: Not on file    Frequency of Social Gatherings with Friends and Family: Not on file   Attends Religious Services: Not on file   Active Member of Clubs or Organizations: Not on file   Attends Archivist Meetings: Not on file   Marital Status: Not on file  Intimate Partner Violence:    Fear of Current or Ex-Partner: Not on file   Emotionally Abused: Not on file   Physically Abused: Not on file   Sexually Abused: Not on file    ROS Per hpi   Objective   Vitals as reported by the patient: There were no vitals filed for this visit.  Melissa Bridges was seen today for covid vaccine  and medication refill.  Diagnoses and all orders for this visit:  Dark stools  Watery stools  Other fatigue  Melena  Accidental acetaminophen overdose, initial encounter   PLAN  I have substantial concern for GI bleed given the combination of symptoms that this patient is experiencing. Combined with the overdosing on tylenol and gabapentin, this patient warrants in person evaluation  Given her lack of transportation, advised patient to call EMS and seek emergency care immediately.   Discussed risks of delaying emergency care with patient, who demonstrated understanding  Patient seeking further options for pain control which I am reluctant to give at this time given her noncompliance with medication instructions for current pain relief  Patient encouraged to call clinic with any questions, comments, or concerns.   I discussed the assessment and treatment plan with the patient. The patient was provided an opportunity to ask questions and all were answered. The patient agreed with the plan and demonstrated an understanding of the instructions.   The patient was advised to call back or seek an in-person evaluation if the symptoms worsen or if the condition fails to improve as anticipated.  I provided 26 minutes of non-face-to-face time during this encounter.  Maximiano Coss, NP  Primary Care at  [facility]

## 2020-10-22 NOTE — ED Provider Notes (Signed)
[facility] DEPT Provider Note   CSN: [id number] Arrival date & time: 10/22/20  1623     History Chief Complaint  Patient presents with  . Rectal Bleeding  . Abdominal Pain    Melissa Bridges is a 59 y.o. female.  Patient states that she has a history of ulcers and she has been having vomiting and is comfort in her abdomen  The history is provided by the patient. No language interpreter was used.  Abdominal Pain Pain location:  Epigastric Pain quality: aching   Pain radiates to:  Does not radiate Pain severity:  Moderate Onset quality:  Sudden Timing:  Constant Progression:  Worsening Chronicity:  New Context: not alcohol use   Relieved by:  Nothing Worsened by:  Nothing Associated symptoms: no chest pain, no cough, no diarrhea, no fatigue and no hematuria        Past Medical History:  Diagnosis Date  . Allergy   . Alopecia   . Anxiety   . Arthritis   . Asthma   . Asthma    Phreesia 10/22/2020  . Cataract    "mild"  . COPD (chronic obstructive pulmonary disease) ([place])   . Depression   . Depression    Phreesia 10/22/2020  . GERD (gastroesophageal reflux disease)    o cc- takes OTC if needed.  . Glaucoma   . Hepatitis C   . Hypertension    onset age 68.  . Iron deficiency anemia   . Migraine   . Schizoaffective disorder ([place])   . Sciatic pain   . Seizures ([place])    onset in childhood. 06-03-16- per pt 8 months agoGeneralized tonic-clonic.Last seizure couple of months ago- last sz 9-10 months ago per pt 10-12-2016  . Shortness of breath dyspnea   . Substance abuse ([place])   . Ulcer     Patient Active Problem List   Diagnosis Date Noted  . Other fatigue 10/22/2020  . Dark stools 10/22/2020  . Other psychoactive substance dependence, in remission ([place]) 11/11/2019  . Orthostatic hypotension   . Syncope 10/11/2019  . Screening examination for STD (sexually transmitted disease) 06/20/2019  . Vaginal discharge  06/20/2019  . Dysuria 06/20/2019  . Bacterial vaginosis 06/20/2019  . Hematuria of unknown cause 06/20/2019  . Acute non-recurrent sinusitis 03/28/2019  . Overdose   . Melena   . Suicidal ideation 07/09/2018  . Anemia 07/09/2018  . Cocaine abuse ([place]) 05/24/2018  . Hypomagnesemia 05/24/2018  . Chronic left hip pain 05/24/2018  . Hypokalemia 05/23/2018  . Tylenol overdose 05/23/2018  . Abdominal pain 05/23/2018  . COPD (chronic obstructive pulmonary disease) ([place]) 05/23/2018  . Serrated adenoma of colon 08/05/2016  . Localization-related idiopathic epilepsy and epileptic syndromes with seizures of localized onset, not intractable, without status epilepticus ([place]) 10/16/2015  . 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 Seizure disorder ([place]) 09/09/2014  . Asthma with acute exacerbation 03/09/2013  . Hypertension 03/09/2013  . Lower back pain 03/09/2013  . Chronic hepatitis C without hepatic coma ([place]) 03/08/2013  . Smoker 12/12/2012  . Schizoaffective disorder, bipolar type ([place]) 12/12/2012    Past Surgical History:  Procedure Laterality Date  . ABDOMINAL HYSTERECTOMY     ovarian cyst B, cervical dysplasia, fibroids.   Ovaries intact.  [name] BIOPSY  07/11/2018   Procedure: BIOPSY;  Surgeon: Yetta Flock, MD;  Location: [facility];  Service: Gastroenterology;;  . COLONOSCOPY    . COLONOSCOPY W/ BIOPSIES    . DILATION AND CURETTAGE OF UTERUS  x2  . ESOPHAGOGASTRODUODENOSCOPY (EGD) WITH PROPOFOL N/A 07/11/2018   Procedure: ESOPHAGOGASTRODUODENOSCOPY (EGD) WITH PROPOFOL;  Surgeon: Yetta Flock, MD;  Location: [facility];  Service: Gastroenterology;  Laterality: N/A;  . EXPLORATORY LAPAROTOMY     x3  . GYNECOLOGIC CRYOSURGERY     x3  . HOT HEMOSTASIS N/A 07/11/2018   Procedure: HOT HEMOSTASIS (ARGON PLASMA COAGULATION/BICAP);  Surgeon: Yetta Flock, MD;  Location: [facility];  Service: Gastroenterology;  Laterality: N/A;  . MANDIBLE RECONSTRUCTION N/A  06/27/2015   Procedure: REMOVAL MAXILLARY PALATAL TORUS.  REMOVAL MANDIBULAR TORUS AND EXOSTOSIS.  ;  Surgeon: Diona Browner, DDS;  Location: [facility];  Service: Oral Surgery;  Laterality: N/A;  . OVARIAN CYST REMOVAL  2008  . POLYPECTOMY       OB History   No obstetric history on file.     Family History  Problem Relation Age of Onset  . Diabetes type II Mother   . Hypertension Mother   . Arthritis Mother   . Diabetes Mother   . Mental illness Mother        bipolar  . Diabetes type II Maternal Aunt   . Hypertension Father   . Heart murmur Father   . Arthritis Father   . Stroke Father   . Alopecia Sister   . Alopecia Brother   . Alopecia Sister   . Mental retardation Sister        depression  . Prostate cancer Paternal Grandfather   . Cancer Maternal Uncle   . Colon cancer Neg Hx   . Esophageal cancer Neg Hx   . Rectal cancer Neg Hx   . Stomach cancer Neg Hx   . Colon polyps Neg Hx     Social History   Tobacco Use  . Smoking status: Current Every Day Smoker    Packs/day: 0.33    Years: 44.00    Pack years: 14.52    Types: Cigarettes  . Smokeless tobacco: Never Used  Vaping Use  . Vaping Use: Never used  Substance Use Topics  . Alcohol use: Yes    Alcohol/week: 0.0 standard drinks    Comment: 1 pakj every 3 days  . Drug use: Not Currently    Types: Marijuana, Cocaine    Comment: not used in 5 months     Home Medications Prior to Admission medications   Medication Sig Start Date End Date Taking? Authorizing Provider  acetaminophen (TYLENOL) 500 MG tablet Take 2,000-2,500 mg by mouth every 4 (four) hours as needed (pain).    [provider]  albuterol (PROAIR HFA) 108 (90 Base) MCG/ACT inhaler INHALE 2 PUFFS BY MOUTH EVERY 6 HOURS AS NEEDED FOR SHORTNESS OF BREATH 06/06/20   Maximiano Coss, NP  Blood Pressure Monitor KIT Dispense one blood pressure monitor to blood pressure 10/22/19   Stallings, Zoe A, MD  budesonide-formoterol (SYMBICORT) 80-4.5  MCG/ACT inhaler Inhale 2 puffs into the lungs 2 (two) times daily.    [provider]  citalopram (CELEXA) 20 MG tablet Take 1 tablet (20 mg total) by mouth daily. 08/29/19   Forrest Moron, MD  gabapentin (NEURONTIN) 400 MG capsule Take 1 capsule (400 mg total) by mouth 3 (three) times daily. 05/21/20   Maximiano Coss, NP  ipratropium-albuterol (DUONEB) 0.5-2.5 (3) MG/3ML SOLN Take 3 mLs by nebulization every 6 (six) hours as needed (shortness of breath). Reported on 12/02/2015 05/21/20   Maximiano Coss, NP  lamoTRIgine (LAMICTAL) 25 MG tablet TAKE 2 TABLETS (50 MG TOTAL) BY  MOUTH DAILY 11/05/19   Cameron Sprang, MD  methocarbamol (ROBAXIN) 500 MG tablet TAKE 1 TABLET (500 MG TOTAL) BY MOUTH 4 (FOUR) TIMES DAILY. 10/08/20   Maximiano Coss, NP  montelukast (SINGULAIR) 10 MG tablet TAKE 1 TABLET BY MOUTH EVERYDAY AT BEDTIME 08/25/20   Maximiano Coss, NP  Multiple Vitamin (MULTIVITAMIN WITH MINERALS) TABS tablet Take 1 tablet by mouth daily. 07/16/18   Roxan Hockey, MD  ondansetron (ZOFRAN) 4 MG tablet Take 1 tablet (4 mg total) by mouth every 6 (six) hours. 10/22/20   Milton Ferguson, MD  pantoprazole (PROTONIX) 20 MG tablet Take 1 tablet (20 mg total) by mouth daily. 10/22/20   Milton Ferguson, MD  predniSONE (STERAPRED UNI-PAK 21 TAB) 10 MG (21) TBPK tablet Take as directed 08/14/20   Aundra Dubin, PA-C  QUEtiapine Fumarate (SEROQUEL XR) 50 MG TB24 24 hr tablet Take 1 tablet (50 mg total) by mouth at bedtime. 01/17/20   Forrest Moron, MD  SYMBICORT 160-4.5 MCG/ACT inhaler TAKE 2 PUFFS BY MOUTH TWICE A DAY 09/18/20   Maximiano Coss, NP  thiamine 100 MG tablet Take 1 tablet (100 mg total) by mouth daily. 02/05/19   Rutherford Guys, MD  tiZANidine (ZANAFLEX) 4 MG tablet TAKE 1 TABLET (4 MG TOTAL) BY MOUTH 3 (THREE) TIMES DAILY AS NEEDED FOR MUSCLE SPASMS. 04/10/20   Delia Chimes A, MD  triamcinolone cream (KENALOG) 0.1 % APPLY CREAM EXTERNALLY TWICE DAILY Patient taking differently: Apply  1 application topically 2 (two) times daily as needed (rash/irritation).  10/03/19   Forrest Moron, MD  valACYclovir (VALTREX) 1000 MG tablet TAKE 2 TABLETS BY MOUTH AT SYMPTOM ONSET, REPEAT IN 12 HOURS 10/08/20   Maximiano Coss, NP  vitamin C (ASCORBIC ACID) 250 MG tablet Take 250 mg by mouth daily.    [provider]    Allergies    Fruit & vegetable daily [nutritional supplements], Iodinated diagnostic agents, Ibuprofen, Aloe vera, and Aspirin  Review of Systems   Review of Systems  Constitutional: Negative for appetite change and fatigue.  HENT: Negative for congestion, ear discharge and sinus pressure.   Eyes: Negative for discharge.  Respiratory: Negative for cough.   Cardiovascular: Negative for chest pain.  Gastrointestinal: Positive for abdominal pain. Negative for diarrhea.  Genitourinary: Negative for frequency and hematuria.  Musculoskeletal: Negative for back pain.  Skin: Negative for rash.  Neurological: Negative for seizures and headaches.  Psychiatric/Behavioral: Negative for hallucinations.    Physical Exam Updated Vital Signs BP 131/75 (BP Location: Right Arm)   Pulse 80   Temp 97.8 F (36.6 C) (Oral)   Resp 16   Ht 5' 3"  (1.6 m)   Wt 51.3 kg   SpO2 99%   BMI 20.03 kg/m   Physical Exam Vitals reviewed.  Constitutional:      Appearance: She is well-developed.  HENT:     Head: Normocephalic.     Right Ear: Tympanic membrane normal.     Nose: Nose normal.  Eyes:     General: No scleral icterus.    Conjunctiva/sclera: Conjunctivae normal.  Neck:     Thyroid: No thyromegaly.  Cardiovascular:     Rate and Rhythm: Normal rate and regular rhythm.     Heart sounds: No murmur heard.  No friction rub. No gallop.   Pulmonary:     Breath sounds: No stridor. No wheezing or rales.  Chest:     Chest wall: No tenderness.  Abdominal:     General: There is  no distension.     Tenderness: There is abdominal tenderness. There is no rebound.   Musculoskeletal:        General: Normal range of motion.     Cervical back: Neck supple.  Lymphadenopathy:     Cervical: No cervical adenopathy.  Skin:    Findings: No erythema or rash.  Neurological:     Mental Status: She is alert and oriented to person, place, and time.     Motor: No abnormal muscle tone.     Coordination: Coordination normal.  Psychiatric:        Behavior: Behavior normal.     ED Results / Procedures / Treatments   Labs (all labs ordered are listed, but only abnormal results are displayed) Labs Reviewed  COMPREHENSIVE METABOLIC PANEL - Abnormal; Notable for the following components:      Result Value   Sodium 131 (*)    Potassium 3.3 (*)    Chloride 95 (*)    AST 93 (*)    ALT 63 (*)    All other components within normal limits  CBC - Abnormal; Notable for the following components:   RBC 3.70 (*)    MCV 104.9 (*)    MCH 37.3 (*)    All other components within normal limits  LIPASE, BLOOD  URINALYSIS, ROUTINE W REFLEX MICROSCOPIC  OCCULT BLOOD X 1 CARD TO LAB, STOOL  POC OCCULT BLOOD, ED  TYPE AND SCREEN    EKG None  Radiology DG ABD ACUTE 2+V W 1V CHEST  Result Date: 10/22/2020 CLINICAL DATA:  Intermittent black tarry stool.  Abdominal pain. EXAM: DG ABDOMEN ACUTE WITH 1 VIEW CHEST COMPARISON:  July 09, 2018 FINDINGS: There is no acute cardiopulmonary process. There is no pneumothorax or large pleural effusion. There is an opacity projecting over the left fourth rib anteriorly. This may be related to the rib itself or the underlying lung parenchyma. The cardiomediastinal silhouette is unremarkable. There is a large amount of stool throughout the colon. The bowel gas pattern is nonobstructive. There are no radiopaque kidney stones. IMPRESSION: 1. No acute cardiopulmonary process. 2. Large amount of stool throughout the colon. No evidence of bowel obstruction or free air. 3. No radiopaque kidney stones. 4. Questionable lesion involving the anterior  left fourth rib versus an underlying airspace opacity. A 4-6 week follow-up two-view chest x-ray is recommended to confirm resolution of this finding. Electronically Signed   By: Constance Holster M.D.   On: 10/22/2020 20:42    Procedures Procedures (including critical care time)  Medications Ordered in ED Medications  sodium chloride 0.9 % bolus 1,000 mL (1,000 mLs Intravenous New Bag/Given 10/22/20 2045)  pantoprazole (PROTONIX) injection 40 mg (40 mg Intravenous Given 10/22/20 2047)    ED Course  I have reviewed the triage vital signs and the nursing notes.  Pertinent labs & imaging results that were available during my care of the patient were reviewed by me and considered in my medical decision making (see chart for details).    MDM Rules/Calculators/A&P                          Patient with gastritis.  She will be sent home on Protonix.  Labs unremarkable she will follow up with her PCP next week Final Clinical Impression(s) / ED Diagnoses Final diagnoses:  None    Rx / DC Orders ED Discharge Orders         Ordered  pantoprazole (PROTONIX) 20 MG tablet  Daily        10/22/20 2156    ondansetron (ZOFRAN) 4 MG tablet  Every 6 hours        10/22/20 2157           Milton Ferguson, MD 10/22/20 2203

## 2020-10-22 NOTE — ED Triage Notes (Signed)
Pt reports intermittent black tarry stool for a few weeks as well have lower abdominal pain. Pt also states she was vomiting but stopped after finishing antibiotics.

## 2020-10-23 ENCOUNTER — Other Ambulatory Visit: Payer: Self-pay | Admitting: Registered Nurse

## 2020-10-23 DIAGNOSIS — M545 Low back pain, unspecified: Secondary | ICD-10-CM

## 2020-10-23 DIAGNOSIS — R252 Cramp and spasm: Secondary | ICD-10-CM

## 2020-10-23 DIAGNOSIS — M25559 Pain in unspecified hip: Secondary | ICD-10-CM

## 2020-10-23 DIAGNOSIS — G8929 Other chronic pain: Secondary | ICD-10-CM

## 2020-10-30 IMAGING — DX DG SHOULDER 2+V*L*
3 series · 3 of 3 positions shown · non-contrast
Comparison: None.

CLINICAL DATA: Pain.  Remote history of dislocation.

EXAM:
LEFT SHOULDER - 2+ VIEW

[shoulder ap]
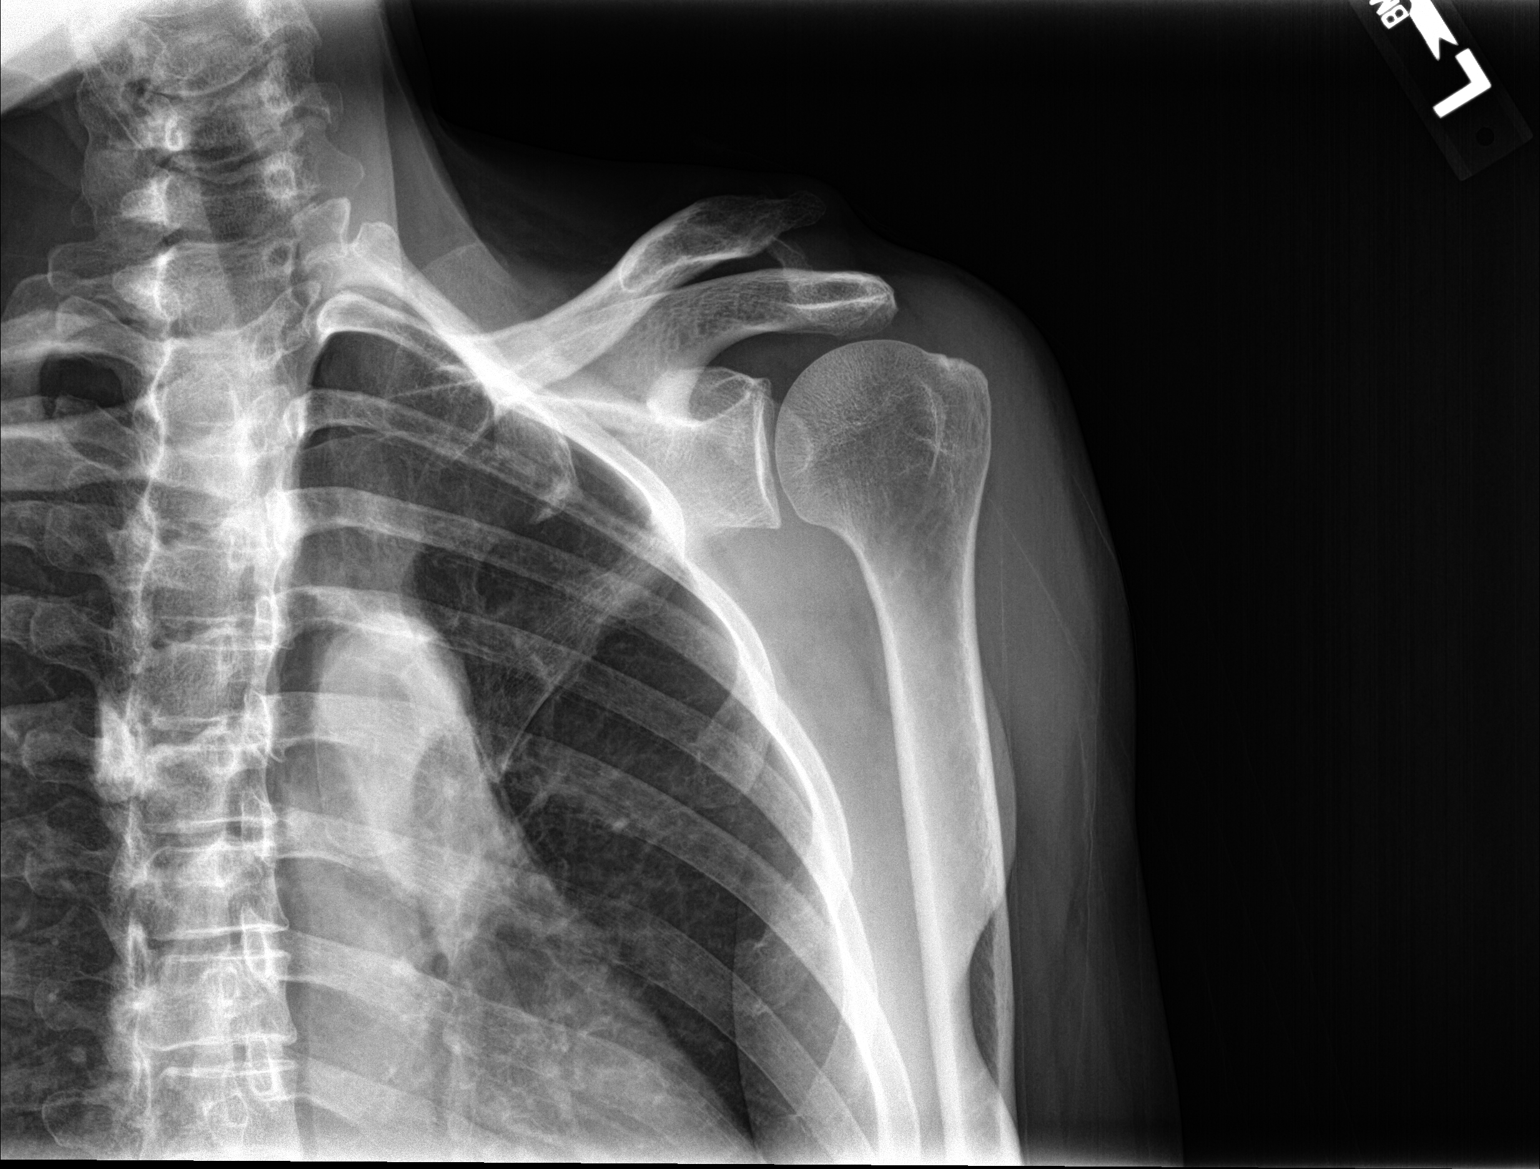

[shoulder y-view]
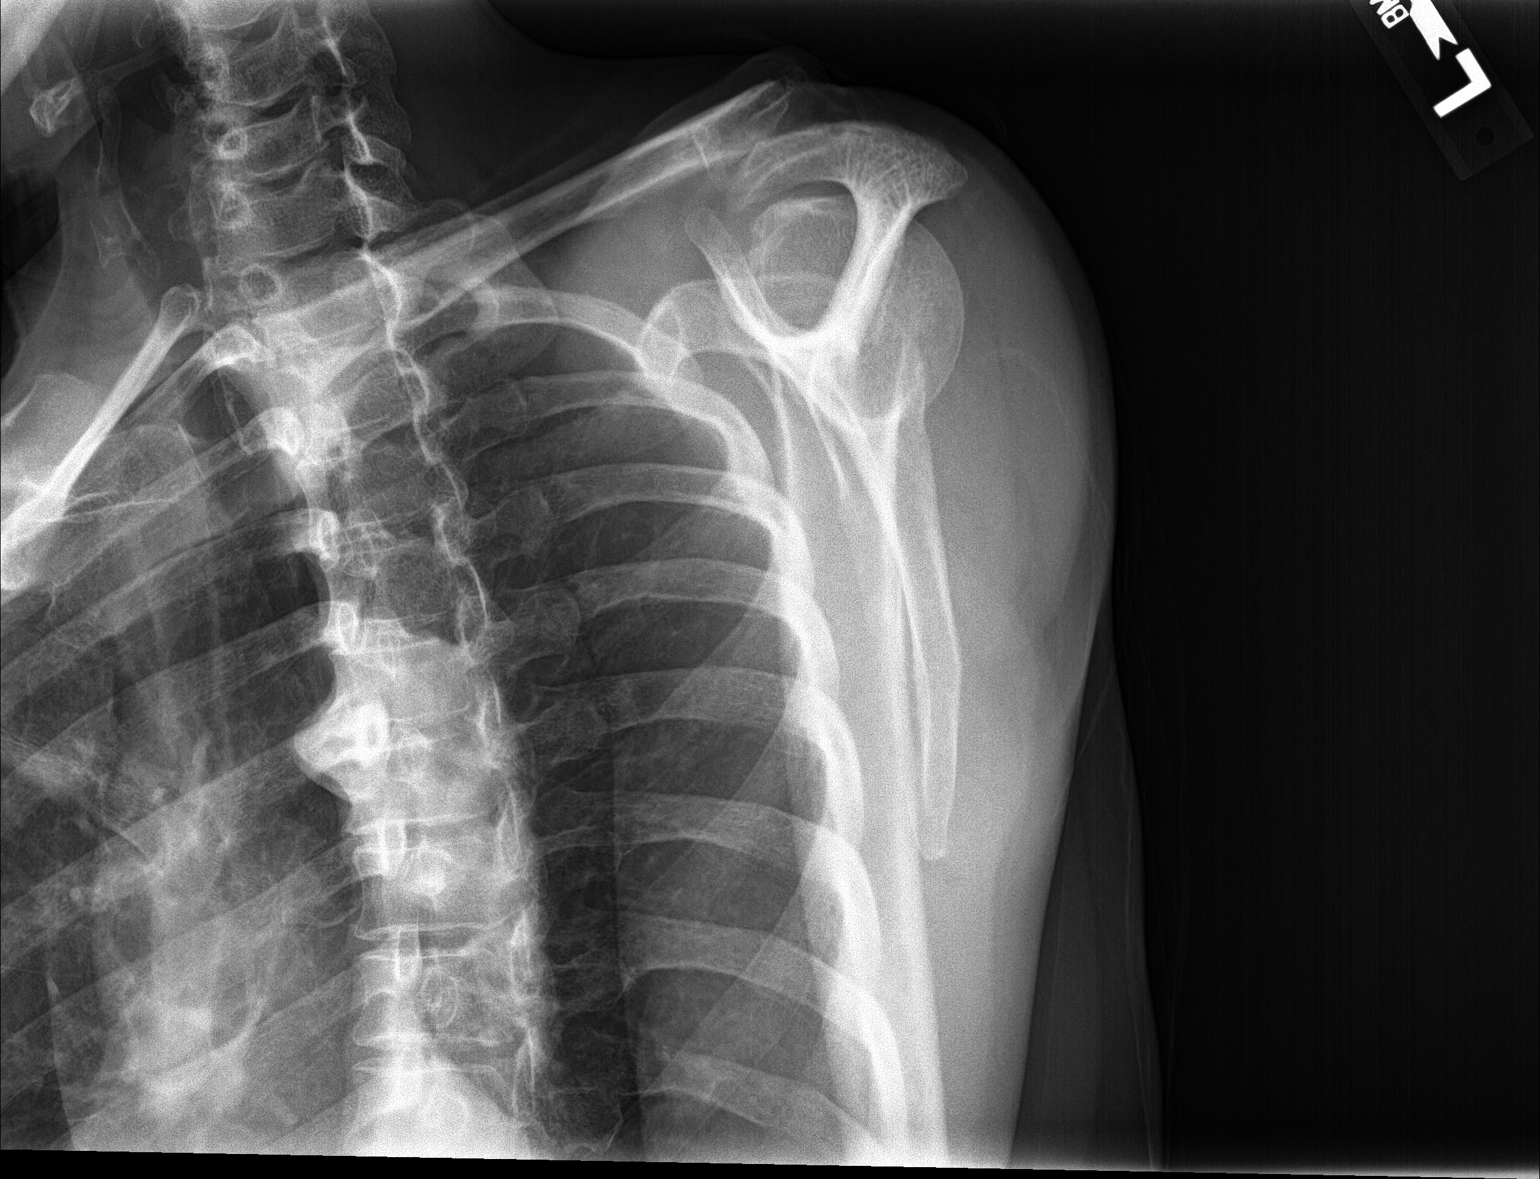

[shoulder axial]
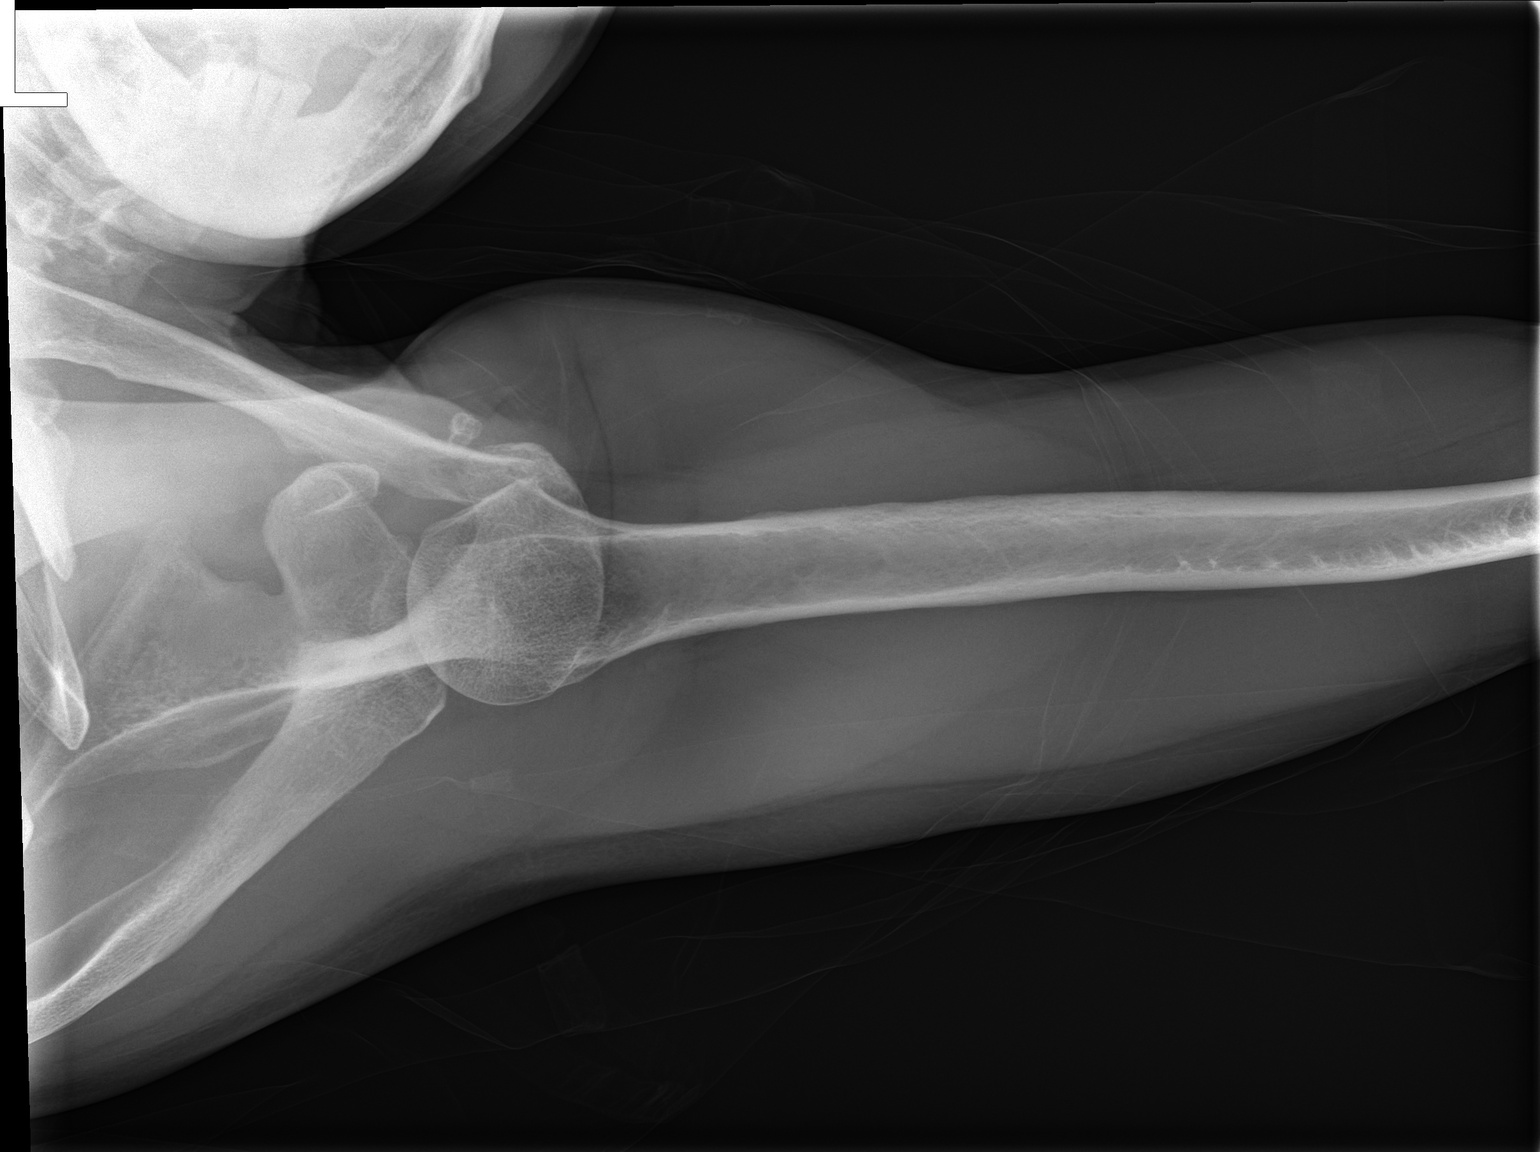

[3 of 3 positions shown; findings below may reference images not displayed]

FINDINGS: There is no evidence of fracture or dislocation. There is no
evidence of arthropathy or other focal bone abnormality. Soft
tissues are unremarkable.
IMPRESSION: Negative.

## 2020-11-04 ENCOUNTER — Other Ambulatory Visit: Payer: Self-pay | Admitting: Registered Nurse

## 2020-11-04 DIAGNOSIS — M2550 Pain in unspecified joint: Secondary | ICD-10-CM

## 2020-11-04 DIAGNOSIS — M62838 Other muscle spasm: Secondary | ICD-10-CM

## 2020-11-04 MED ORDER — METHOCARBAMOL 500 MG PO TABS: ORAL_TABLET | ORAL | 0 refills | Status: DC

## 2020-11-04 MED ORDER — GABAPENTIN 400 MG PO CAPS: 400.0000 mg | ORAL_CAPSULE | Freq: Three times a day (TID) | ORAL | 1 refills | Status: DC

## 2020-11-04 NOTE — Telephone Encounter (Signed)
Requested medication (s) are due for refill today: no  Requested medication (s) are on the active medication list: yes   Future visit scheduled:no  Notes to clinic: Patient is also requesting Tramadol but not on current medication list    Requested Prescriptions  Pending Prescriptions Disp Refills   gabapentin (NEURONTIN) 400 MG capsule 270 capsule 1    Sig: Take 1 capsule (400 mg total) by mouth 3 (three) times daily.      Neurology: Anticonvulsants - gabapentin Passed - 11/04/2020 10:20 AM      Passed - Valid encounter within last 12 months    Recent Outpatient Visits           1 week ago Dark stools   [facility], Delfino Lovett, NP   3 weeks ago Cough   [facility], Delfino Lovett, NP   3 months ago Encounter for screening for COVID-19   [facility], Delfino Lovett, NP   3 months ago Leg cramping   [facility], [place], NP   5 months ago Panlobular emphysema [facility])   [facility], [place], NP                methocarbamol (ROBAXIN) 500 MG tablet 60 tablet 0    Sig: TAKE 1 TABLET (500 MG TOTAL) BY MOUTH 4 (FOUR) TIMES DAILY.      Not Delegated - Analgesics:  Muscle Relaxants Failed - 11/04/2020 10:20 AM      Failed - This refill cannot be delegated      Passed - Valid encounter within last 6 months    Recent Outpatient Visits           1 week ago Dark stools   [facility], Delfino Lovett, NP   3 weeks ago Cough   [facility], Delfino Lovett, NP   3 months ago Encounter for screening for COVID-19   [facility], Delfino Lovett, NP   3 months ago Leg cramping   [facility], Delfino Lovett, NP   5 months ago Panlobular emphysema [facility])   [facility] [place], NP

## 2020-11-04 NOTE — Telephone Encounter (Signed)
Copied from [place] [id number]. Topic: Quick Communication - Rx Refill/Question >> Nov 04, 2020 10:10 AM Yvette Rack wrote: Medication: methocarbamol (ROBAXIN) 500 MG tablet, traMADol (ULTRAM) 50 MG tablet, and gabapentin (NEURONTIN) 400 MG capsule  Has the patient contacted their pharmacy? no  Preferred Pharmacy (with phone number or street name): [id number] - [place], [place]  Phone: [phone]   Fax: [phone]  Agent: Please be advised that RX refills may take up to 3 business days. We ask that you follow-up with your pharmacy.

## 2020-11-15 ENCOUNTER — Other Ambulatory Visit: Payer: Self-pay | Admitting: Registered Nurse

## 2020-11-15 DIAGNOSIS — J431 Panlobular emphysema: Secondary | ICD-10-CM

## 2020-11-15 NOTE — Telephone Encounter (Signed)
Requested medications are due for refill today yes  Requested medications are on the active medication list yes  Last refill 11/8  Last visit 11/10  Future visit scheduled no  Notes to clinic Dr. Orland Mustard gave one with one refill and has had a televisit since but not mentioned. Please assess if this is to be continued.

## 2020-11-18 ENCOUNTER — Ambulatory Visit: Payer: 59 | Admitting: Neurology

## 2020-11-19 ENCOUNTER — Other Ambulatory Visit: Payer: Self-pay

## 2020-11-19 ENCOUNTER — Other Ambulatory Visit: Payer: Self-pay | Admitting: Pain Medicine

## 2020-11-19 ENCOUNTER — Ambulatory Visit
Admission: RE | Admit: 2020-11-19 | Discharge: 2020-11-19 | Disposition: A | Discharge status: Home / Self Care | Payer: 59 | Source: Ambulatory Visit | Attending: Pain Medicine | Admitting: Pain Medicine

## 2020-11-19 DIAGNOSIS — M25512 Pain in left shoulder: Secondary | ICD-10-CM

## 2020-11-21 ENCOUNTER — Telehealth: Payer: Self-pay | Admitting: Registered Nurse

## 2020-11-21 NOTE — Telephone Encounter (Signed)
Patient is wanting to schedule AWV   Please call [phone]

## 2020-11-21 NOTE — Telephone Encounter (Signed)
Need order for home health

## 2020-11-21 NOTE — Telephone Encounter (Signed)
Randell Patient called from [facility] needing an order for home health care. [phone]. If any questions call  [facility] [place] at [phone].

## 2020-12-08 ENCOUNTER — Other Ambulatory Visit: Payer: Self-pay | Admitting: Registered Nurse

## 2020-12-08 DIAGNOSIS — J431 Panlobular emphysema: Secondary | ICD-10-CM

## 2020-12-11 ENCOUNTER — Other Ambulatory Visit: Payer: Self-pay | Admitting: Registered Nurse

## 2020-12-11 NOTE — Telephone Encounter (Signed)
Requested medication (s) are due for refill today: yes  Requested medication (s) are on the active medication list: yes  Last refill:  10/22/20  #30  0 refills  Future visit scheduled: No  Notes to clinic:  *historic provider ER provider    Requested Prescriptions  Pending Prescriptions Disp Refills   pantoprazole (PROTONIX) 20 MG tablet [Pharmacy Med Name: PANTOPRAZOLE SOD DR 20 MG TAB] 30 tablet 0    Sig: TAKE 1 TABLET BY MOUTH EVERY DAY      Gastroenterology: Proton Pump Inhibitors Passed - 12/11/2020  1:27 PM      Passed - Valid encounter within last 12 months    Recent Outpatient Visits           1 month ago Dark stools   [facility], Gerlene Burdock, NP   2 months ago Cough   [facility], Gerlene Burdock, NP   4 months ago Encounter for screening for COVID-19   [facility], Gerlene Burdock, NP   4 months ago Leg cramping   [facility], Gerlene Burdock, NP   6 months ago Panlobular emphysema [facility])   [facility], Gerlene Burdock, NP

## 2020-12-23 ENCOUNTER — Other Ambulatory Visit: Payer: 59

## 2020-12-23 ENCOUNTER — Other Ambulatory Visit: Payer: Self-pay

## 2020-12-23 DIAGNOSIS — Z20822 Contact with and (suspected) exposure to covid-19: Secondary | ICD-10-CM

## 2020-12-26 ENCOUNTER — Telehealth: Payer: Self-pay | Admitting: Registered Nurse

## 2020-12-26 NOTE — Telephone Encounter (Signed)
Pt is calling stated that she is needing a form sent to 3051 form for home health faxed to [facility] and a copy sent to her. Pt stated she already has been see in the office for this by him. Please Advise.

## 2020-12-27 LAB — NOVEL CORONAVIRUS, NAA: SARS-CoV-2, NAA: NOT DETECTED

## 2020-12-29 NOTE — Telephone Encounter (Signed)
Do you know anything about this? 

## 2020-12-30 NOTE — Telephone Encounter (Signed)
I do not, and I do not have any forms from this patient. If she can provide them or have [place] fax them, that would be great  Thank you  Denice Paradise

## 2021-01-01 NOTE — Telephone Encounter (Signed)
Have not seen these forms.

## 2021-01-02 ENCOUNTER — Encounter: Payer: Self-pay | Admitting: Neurology

## 2021-01-02 ENCOUNTER — Ambulatory Visit: Payer: 59 | Admitting: Neurology

## 2021-01-02 ENCOUNTER — Telehealth: Payer: Self-pay | Admitting: Neurology

## 2021-01-02 NOTE — Telephone Encounter (Signed)
This patient did not show for a new patient appointment today. 

## 2021-01-05 ENCOUNTER — Telehealth: Payer: Self-pay | Admitting: Registered Nurse

## 2021-01-05 NOTE — Telephone Encounter (Signed)
Tried calling pt no luck phone not in service. Sent MyChart message today.

## 2021-01-05 NOTE — Telephone Encounter (Signed)
Is anyone aware of the location of these forms? If theres no copy in office will be calling pt this afternoon to request resend

## 2021-01-14 ENCOUNTER — Other Ambulatory Visit: Payer: Self-pay | Admitting: Registered Nurse

## 2021-01-15 ENCOUNTER — Other Ambulatory Visit: Payer: Self-pay | Admitting: Registered Nurse

## 2021-01-15 DIAGNOSIS — J431 Panlobular emphysema: Secondary | ICD-10-CM

## 2021-01-30 ENCOUNTER — Ambulatory Visit: Payer: 59 | Admitting: Registered Nurse

## 2021-02-04 ENCOUNTER — Other Ambulatory Visit: Payer: Self-pay | Admitting: Registered Nurse

## 2021-02-04 DIAGNOSIS — J431 Panlobular emphysema: Secondary | ICD-10-CM

## 2021-02-04 NOTE — Telephone Encounter (Signed)
Requested Prescriptions  Pending Prescriptions Disp Refills  . SYMBICORT 160-4.5 MCG/ACT inhaler [Pharmacy Med Name: SYMBICORT 160-4.5 MCG INHALER] 10.2 each 2    Sig: TAKE 2 PUFFS BY MOUTH TWICE A DAY     Pulmonology:  Combination Products Passed - 02/04/2021  3:34 AM      Passed - Valid encounter within last 12 months    Recent Outpatient Visits          3 months ago Dark stools   [facility], Delfino Lovett, NP   3 months ago Cough   [facility], Delfino Lovett, NP   6 months ago Encounter for screening for COVID-19   [facility] [place], NP   6 months ago Leg cramping   [facility], Delfino Lovett, NP   8 months ago Panlobular emphysema [facility])   [facility] [place], NP

## 2021-02-07 ENCOUNTER — Other Ambulatory Visit: Payer: Self-pay | Admitting: Registered Nurse

## 2021-02-07 NOTE — Telephone Encounter (Signed)
Requested Prescriptions  Pending Prescriptions Disp Refills  . pantoprazole (PROTONIX) 20 MG tablet [Pharmacy Med Name: PANTOPRAZOLE SOD DR 20 MG TAB] 90 tablet 0    Sig: TAKE 1 TABLET BY MOUTH EVERY DAY     Gastroenterology: Proton Pump Inhibitors Passed - 02/07/2021  9:30 AM      Passed - Valid encounter within last 12 months    Recent Outpatient Visits          3 months ago Dark stools   [facility] [place], NP   4 months ago Cough   [facility], Delfino Lovett, NP   6 months ago Encounter for screening for COVID-19   [facility] [place], NP   6 months ago Leg cramping   [facility], Delfino Lovett, NP   8 months ago Panlobular emphysema [facility])   [facility] [place], NP

## 2021-03-02 ENCOUNTER — Ambulatory Visit (INDEPENDENT_AMBULATORY_CARE_PROVIDER_SITE_OTHER): Payer: 59 | Admitting: Registered Nurse

## 2021-03-02 ENCOUNTER — Other Ambulatory Visit: Payer: Self-pay

## 2021-03-02 ENCOUNTER — Encounter: Payer: Self-pay | Admitting: Registered Nurse

## 2021-03-02 ENCOUNTER — Other Ambulatory Visit (HOSPITAL_COMMUNITY)
Admission: RE | Admit: 2021-03-02 | Discharge: 2021-03-02 | Disposition: A | Discharge status: Home / Self Care | Payer: 59 | Source: Ambulatory Visit | Attending: Registered Nurse | Admitting: Registered Nurse

## 2021-03-02 VITALS — BP 143/80 | HR 80 | Temp 98.0°F | Resp 18 | Ht 63.0 in

## 2021-03-02 DIAGNOSIS — R252 Cramp and spasm: Secondary | ICD-10-CM

## 2021-03-02 DIAGNOSIS — L608 Other nail disorders: Secondary | ICD-10-CM | POA: Diagnosis not present

## 2021-03-02 DIAGNOSIS — R300 Dysuria: Secondary | ICD-10-CM | POA: Diagnosis not present

## 2021-03-02 DIAGNOSIS — R350 Frequency of micturition: Secondary | ICD-10-CM | POA: Diagnosis not present

## 2021-03-02 DIAGNOSIS — Z113 Encounter for screening for infections with a predominantly sexual mode of transmission: Secondary | ICD-10-CM

## 2021-03-02 DIAGNOSIS — G8929 Other chronic pain: Secondary | ICD-10-CM

## 2021-03-02 DIAGNOSIS — M545 Low back pain, unspecified: Secondary | ICD-10-CM

## 2021-03-02 DIAGNOSIS — W19XXXA Unspecified fall, initial encounter: Secondary | ICD-10-CM

## 2021-03-02 LAB — POCT URINALYSIS DIP (CLINITEK)
Bilirubin, UA: NEGATIVE
Glucose, UA: NEGATIVE mg/dL
Ketones, POC UA: NEGATIVE mg/dL
Nitrite, UA: NEGATIVE
POC PROTEIN,UA: NEGATIVE
Spec Grav, UA: 1.025 (ref 1.010–1.025)
Urobilinogen, UA: 0.2 E.U./dL
pH, UA: 5.5 (ref 5.0–8.0)

## 2021-03-02 NOTE — Progress Notes (Signed)
Established Patient Office Visit  Subjective:  Patient ID: Melissa Bridges, female    DOB: 08/02/1961  Age: 60 y.o. MRN: [id number]  CC:  Chief Complaint  Patient presents with  . Arm Pain    Patient states she fell 3 weeks ago and hurt her right elbow. Patient also thinks she has a uti because when she urinate she has some burning. She also would like to get std testing. Patient unable to void at this time.    HPI [name] presents for arm pain, dysuria, sti screening, and referrals  Arm pain [place] about 3 weeks ago, landed hard on elbow Some bruising Full ROM, no ongoing swelling No distal symptoms. Otherwise no concerns.  Dysuria Ongoing for about one week Urgency, frequency, and feeling of incomplete voiding Had intercourse with new partner and condom broke No vaginal symptoms but would like testing.  Nail discoloration Second and third digits of R foot Darker than usual "turning black" Nail is intact, do not seem to be brittle. No involvement of skin surrounding nail Unsure of onset Has not happened before  Urology Would like to get referred back to urology for follow up appt Would like to continue seeing same provider  Home health Asking for new referral for home health Had nursing to help out around home with meds, avoiding seizures, and avoiding falls Interested in PT at home as well  No further concerns at this time.   Past Medical History:  Diagnosis Date  . Allergy   . Alopecia   . Anxiety   . Arthritis   . Asthma   . Asthma    Phreesia 10/22/2020  . Cataract    "mild"  . COPD (chronic obstructive pulmonary disease) ([place])   . Depression   . Depression    Phreesia 10/22/2020  . GERD (gastroesophageal reflux disease)    o cc- takes OTC if needed.  . Glaucoma   . Hepatitis C   . Hypertension    onset age 17.  . Iron deficiency anemia   . Migraine   . Schizoaffective disorder ([place])   . Sciatic pain   . Seizures  ([place])    onset in childhood. 06-03-16- per pt 8 months agoGeneralized tonic-clonic.Last seizure couple of months ago- last sz 9-10 months ago per pt 10-12-2016  . Shortness of breath dyspnea   . Substance abuse ([place])   . Ulcer     Past Surgical History:  Procedure Laterality Date  . ABDOMINAL HYSTERECTOMY     ovarian cyst B, cervical dysplasia, fibroids.   Ovaries intact.  [name] BIOPSY  07/11/2018   Procedure: BIOPSY;  Surgeon: Yetta Flock, MD;  Location: [facility];  Service: Gastroenterology;;  . COLONOSCOPY    . COLONOSCOPY W/ BIOPSIES    . DILATION AND CURETTAGE OF UTERUS     x2  . ESOPHAGOGASTRODUODENOSCOPY (EGD) WITH PROPOFOL N/A 07/11/2018   Procedure: ESOPHAGOGASTRODUODENOSCOPY (EGD) WITH PROPOFOL;  Surgeon: Yetta Flock, MD;  Location: [facility];  Service: Gastroenterology;  Laterality: N/A;  . EXPLORATORY LAPAROTOMY     x3  . GYNECOLOGIC CRYOSURGERY     x3  . HOT HEMOSTASIS N/A 07/11/2018   Procedure: HOT HEMOSTASIS (ARGON PLASMA COAGULATION/BICAP);  Surgeon: Yetta Flock, MD;  Location: [facility];  Service: Gastroenterology;  Laterality: N/A;  . MANDIBLE RECONSTRUCTION N/A 06/27/2015   Procedure: REMOVAL MAXILLARY PALATAL TORUS.  REMOVAL MANDIBULAR TORUS AND EXOSTOSIS.  ;  Surgeon: Diona Browner, DDS;  Location: [facility];  Service:  Oral Surgery;  Laterality: N/A;  . OVARIAN CYST REMOVAL  2008  . POLYPECTOMY      Family History  Problem Relation Age of Onset  . Diabetes type II Mother   . Hypertension Mother   . Arthritis Mother   . Diabetes Mother   . Mental illness Mother        bipolar  . Diabetes type II Maternal Aunt   . Hypertension Father   . Heart murmur Father   . Arthritis Father   . Stroke Father   . Alopecia Sister   . Alopecia Brother   . Alopecia Sister   . Mental retardation Sister        depression  . Prostate cancer Paternal Grandfather   . Cancer Maternal Uncle   . Colon cancer Neg Hx   . Esophageal cancer Neg Hx   .  Rectal cancer Neg Hx   . Stomach cancer Neg Hx   . Colon polyps Neg Hx     Social History   Socioeconomic History  . Marital status: Widowed    Spouse name: Not on file  . Number of children: 1  . Years of education: Not on file  . Highest education level: Not on file  Occupational History  . Occupation: disabled    Comment: mental illness; seizures  Tobacco Use  . Smoking status: Current Every Day Smoker    Packs/day: 0.33    Years: 44.00    Pack years: 14.52    Types: Cigarettes  . Smokeless tobacco: Never Used  Vaping Use  . Vaping Use: Never used  Substance and Sexual Activity  . Alcohol use: Yes    Alcohol/week: 0.0 standard drinks    Comment: 1 pakj every 3 days  . Drug use: Not Currently    Types: Marijuana, Cocaine    Comment: not used in 5 months   . Sexual activity: Not Currently    Birth control/protection: None    Comment: widow  Other Topics Concern  . Not on file  Social History Narrative   Marital status:  Widowed since 2002.  Married x 16 years. + dating.  Moved from [place] to live with daughter in 2013.     Children:  One child/daughter (33); two grandchildren.      Lives: with daughter, grandchildren 2.  Does not drive due to epilepsy.      Employment:  Disability for schizoaffective disorder.      Tobacco: 1 ppd x since 8th grade.      Alcohol:  Social; rare drinking due to seizure medications.  Weekends.       Drugs: none; previous use of marijuana.  Previous iv drug use, cocaine.      Exercise: none      Seatbelt:  100%      Guns: none   Social Determinants of Radio broadcast assistant Strain: Not on file  Food Insecurity: Not on file  Transportation Needs: Not on file  Physical Activity: Not on file  Stress: Not on file  Social Connections: Not on file  Intimate Partner Violence: Not on file    Outpatient Medications Prior to Visit  Medication Sig Dispense Refill  . acetaminophen (TYLENOL) 500 MG tablet Take 2,000-2,500 mg by  mouth every 4 (four) hours as needed (pain).    [name] albuterol (VENTOLIN HFA) 108 (90 Base) MCG/ACT inhaler INHALE 2 PUFFS BY MOUTH EVERY 6 HOURS AS NEEDED FOR SHORTNESS OF BREATH 8.5 each 3  . Blood Pressure  Monitor KIT Dispense one blood pressure monitor to blood pressure 1 kit 0  . budesonide-formoterol (SYMBICORT) 80-4.5 MCG/ACT inhaler Inhale 2 puffs into the lungs 2 (two) times daily.    . citalopram (CELEXA) 20 MG tablet Take 1 tablet (20 mg total) by mouth daily. 90 tablet 0  . gabapentin (NEURONTIN) 400 MG capsule Take 1 capsule (400 mg total) by mouth 3 (three) times daily. 270 capsule 1  . ipratropium-albuterol (DUONEB) 0.5-2.5 (3) MG/3ML SOLN Take 3 mLs by nebulization every 6 (six) hours as needed (shortness of breath). Reported on 12/02/2015 360 mL 1  . lamoTRIgine (LAMICTAL) 25 MG tablet TAKE 2 TABLETS (50 MG TOTAL) BY MOUTH DAILY 60 tablet 0  . methocarbamol (ROBAXIN) 500 MG tablet TAKE 1 TABLET (500 MG TOTAL) BY MOUTH 4 (FOUR) TIMES DAILY. 60 tablet 0  . montelukast (SINGULAIR) 10 MG tablet TAKE 1 TABLET BY MOUTH EVERYDAY AT BEDTIME 90 tablet 1  . Multiple Vitamin (MULTIVITAMIN WITH MINERALS) TABS tablet Take 1 tablet by mouth daily. 30 tablet 1  . ondansetron (ZOFRAN) 4 MG tablet Take 1 tablet (4 mg total) by mouth every 6 (six) hours. 12 tablet 0  . pantoprazole (PROTONIX) 20 MG tablet TAKE 1 TABLET BY MOUTH EVERY DAY 90 tablet 0  . predniSONE (STERAPRED UNI-PAK 21 TAB) 10 MG (21) TBPK tablet Take as directed 21 tablet 0  . QUEtiapine Fumarate (SEROQUEL XR) 50 MG TB24 24 hr tablet Take 1 tablet (50 mg total) by mouth at bedtime. 30 tablet 3  . SYMBICORT 160-4.5 MCG/ACT inhaler TAKE 2 PUFFS BY MOUTH TWICE A DAY 10.2 each 2  . thiamine 100 MG tablet Take 1 tablet (100 mg total) by mouth daily. 90 tablet 1  . tiZANidine (ZANAFLEX) 4 MG tablet TAKE 1 TABLET (4 MG TOTAL) BY MOUTH 3 (THREE) TIMES DAILY AS NEEDED FOR MUSCLE SPASMS. 90 tablet 3  . triamcinolone cream (KENALOG) 0.1 % APPLY  CREAM EXTERNALLY TWICE DAILY (Patient taking differently: Apply 1 application topically 2 (two) times daily as needed (rash/irritation).) 30 g 0  . valACYclovir (VALTREX) 1000 MG tablet TAKE 2 TABLETS BY MOUTH AT SYMPTOM ONSET, REPEAT IN 12 HOURS 20 tablet 2  . vitamin C (ASCORBIC ACID) 250 MG tablet Take 250 mg by mouth daily.     No facility-administered medications prior to visit.    Allergies  Allergen Reactions  . Fruit & Vegetable Daily [Nutritional Supplements] Shortness Of Breath    Reaction to Aloe  . Iodinated Diagnostic Agents Swelling    Swelling and itching of left side of her face only after CT SI injection(no steroid used)  . Ibuprofen Other (See Comments)    Causes Stomach upset; However, pt can take Meloxicam without incident and takes this at home.   . Aloe Vera Rash  . Aspirin Rash and Other (See Comments)    Stomach upset    ROS Review of Systems  Constitutional: Negative.   HENT: Negative.   Eyes: Negative.   Respiratory: Negative.   Cardiovascular: Negative.   Gastrointestinal: Negative.   Genitourinary: Negative.   Musculoskeletal: Negative.   Skin: Negative.   Neurological: Negative.   Psychiatric/Behavioral: Negative.   All other systems reviewed and are negative.     Objective:    Physical Exam Vitals and nursing note reviewed.  Constitutional:      General: She is not in acute distress.    Appearance: Normal appearance. She is normal weight. She is not ill-appearing, toxic-appearing or diaphoretic.  Cardiovascular:  Rate and Rhythm: Normal rate and regular rhythm.     Heart sounds: Normal heart sounds. No murmur heard. No friction rub. No gallop.   Pulmonary:     Effort: Pulmonary effort is normal. No respiratory distress.     Breath sounds: Normal breath sounds. No stridor. No wheezing, rhonchi or rales.  Chest:     Chest wall: No tenderness.  Musculoskeletal:        General: No swelling, tenderness or signs of injury. Normal range  of motion.  Skin:    General: Skin is warm and dry.     Findings: Lesion (hyperpigmentation under second and third digit of R foot. difficult to discern with cosmetic nail applied ) present.  Neurological:     General: No focal deficit present.     Mental Status: She is alert and oriented to person, place, and time. Mental status is at baseline.     Motor: No weakness.  Psychiatric:        Mood and Affect: Mood normal.        Behavior: Behavior normal.        Thought Content: Thought content normal.        Judgment: Judgment normal.     BP (!) 143/80   Pulse 80   Temp 98 F (36.7 C) (Temporal)   Resp 18   Ht 5' 3"  (1.6 m)   SpO2 97%   BMI 20.03 kg/m  Wt Readings from Last 3 Encounters:  10/22/20 113 lb 1.6 oz (51.3 kg)  08/14/20 109 lb 9.6 oz (49.7 kg)  08/05/20 110 lb 12.8 oz (50.3 kg)     There are no preventive care reminders to display for this patient.  There are no preventive care reminders to display for this patient.  Lab Results  Component Value Date   TSH 0.939 07/23/2020   Lab Results  Component Value Date   WBC 6.0 10/22/2020   HGB 13.8 10/22/2020   HCT 38.8 10/22/2020   MCV 104.9 (H) 10/22/2020   PLT 159 10/22/2020   Lab Results  Component Value Date   NA 131 (L) 10/22/2020   K 3.3 (L) 10/22/2020   CO2 26 10/22/2020   GLUCOSE 85 10/22/2020   BUN 9 10/22/2020   CREATININE 0.79 10/22/2020   BILITOT 0.5 10/22/2020   ALKPHOS 94 10/22/2020   AST 93 (H) 10/22/2020   ALT 63 (H) 10/22/2020   PROT 7.9 10/22/2020   ALBUMIN 4.3 10/22/2020   CALCIUM 9.9 10/22/2020   ANIONGAP 10 10/22/2020   Lab Results  Component Value Date   CHOL 219 (H) 07/23/2020   Lab Results  Component Value Date   HDL 88 07/23/2020   Lab Results  Component Value Date   LDLCALC 116 (H) 07/23/2020   Lab Results  Component Value Date   TRIG 86 07/23/2020   Lab Results  Component Value Date   CHOLHDL 2.5 07/23/2020   Lab Results  Component Value Date   HGBA1C  5.0 07/23/2020      Assessment & Plan:   Problem List Items Addressed This Visit      Other   Dysuria   Relevant Orders   Urine cytology ancillary only   Comprehensive metabolic panel   CBC    Other Visit Diagnoses    Frequent urination    -  Primary   Relevant Orders   POCT URINALYSIS DIP (CLINITEK)   Comprehensive metabolic panel   CBC   Screen for STD (sexually transmitted disease)  Relevant Orders   HIV antibody (with reflex)   RPR   Comprehensive metabolic panel   CBC      No orders of the defined types were placed in this encounter.   Follow-up: No follow-ups on file.   PLAN  Arm appears to be bruised, but no evidence of fracture. Given full ROM, limited concern at this time. Suggest RICE method.  Collect urine for poct UA, GC/CT/Trich testing. Will follow up as warranted  Given pt symptoms will order course of macrobid 157m PO bid for five days.  Refer to dermatology for nail abnormality. Likely fungal infection but given dark discoloration want to rule out melanoma.  Refer to urology to previous provider  Refer to home health and PT for falls in home, instability, med management.  Patient encouraged to call clinic with any questions, comments, or concerns.  RMaximiano Coss NP

## 2021-03-02 NOTE — Patient Instructions (Signed)
° ° ° °  If you have lab work done today you will be contacted with your lab results within the next 2 weeks.  If you have not heard from us then please contact us. The fastest way to get your results is to register for My Chart. ° ° °IF you received an x-ray today, you will receive an invoice from [facility]. Please contact [facility] at [phone] with questions or concerns regarding your invoice.  ° °IF you received labwork today, you will receive an invoice from [facility]. Please contact [facility] at [phone] with questions or concerns regarding your invoice.  ° °Our billing staff will not be able to assist you with questions regarding bills from these companies. ° °You will be contacted with the lab results as soon as they are available. The fastest way to get your results is to activate your My Chart account. Instructions are located on the last page of this paperwork. If you have not heard from us regarding the results in 2 weeks, please contact this office. °  ° ° ° °

## 2021-03-03 ENCOUNTER — Other Ambulatory Visit: Payer: Self-pay | Admitting: Registered Nurse

## 2021-03-03 DIAGNOSIS — A599 Trichomoniasis, unspecified: Secondary | ICD-10-CM

## 2021-03-03 LAB — URINE CYTOLOGY ANCILLARY ONLY
Chlamydia: NEGATIVE
Comment: NEGATIVE
Comment: NEGATIVE
Comment: NORMAL
Neisseria Gonorrhea: NEGATIVE
Trichomonas: POSITIVE — AB

## 2021-03-03 MED ORDER — METRONIDAZOLE 500 MG PO TABS: 500.0000 mg | ORAL_TABLET | Freq: Two times a day (BID) | ORAL | 0 refills | Status: DC

## 2021-03-10 ENCOUNTER — Encounter: Payer: Self-pay | Admitting: *Deleted

## 2021-03-10 NOTE — [facility] Nurse Program (Signed)
  Dept: [facility] Nurse Program Note  Date of Encounter: 03/10/2021  Past Medical History: Past Medical History:  Diagnosis Date  . Allergy   . Alopecia   . Anxiety   . Arthritis   . Asthma   . Asthma    Phreesia 10/22/2020  . Cataract    "mild"  . COPD (chronic obstructive pulmonary disease) ([place])   . Depression   . Depression    Phreesia 10/22/2020  . GERD (gastroesophageal reflux disease)    o cc- takes OTC if needed.  . Glaucoma   . Hepatitis C   . Hypertension    onset age 71.  . Iron deficiency anemia   . Migraine   . Schizoaffective disorder ([place])   . Sciatic pain   . Seizures ([place])    onset in childhood. 06-03-16- per pt 8 months agoGeneralized tonic-clonic.Last seizure couple of months ago- last sz 9-10 months ago per pt 10-12-2016  . Shortness of breath dyspnea   . Substance abuse ([place])   . Ulcer     Encounter Details:  CNP Questionnaire - 03/10/21 1834      Questionnaire   Do you give verbal consent to treat you today? Yes    Visit Setting Church or Organization    Location Patient Served At [place]    Patient Status Not Applicable    Medical Provider Yes    [employer]    Intervention Assess (including screenings);Counsel;Educate;Support    Food Have food insecurities    ED Visit Averted Yes          Client came into [facility] clinic and requested BP check.  On the right arm, BP 175/95 and HR 87.  Client has been on BP medications in the past.  She is due for a PCP visit this Thursday.  I explained that her BP is high and it needs to be addressed.  She tells me that her BP was labile and she is not currently prescribed an medications for HTN.  Educated client about decreasing salt intake, drink more water and exercise more.  Encouraged client to follow up with this CN next week after PCP visit.  Karene Fry, RN, MSN, [place] Office [phone] Cell

## 2021-03-13 ENCOUNTER — Telehealth: Payer: Self-pay

## 2021-03-13 NOTE — Telephone Encounter (Signed)
Patient is using both medications Albuterol( HFA ) 108(90 base) inhaler  Albuterol (Duoneb) 0.5-2.5 (3) ml soln  Called and spoke with patient and patient was very irate and stated that she needed to speak with a nurse because noone is helping her and she has mental problems. As I was speaking to her she was able to calm down and confirm that she was taking both the soln and the inhaler.

## 2021-03-16 ENCOUNTER — Telehealth: Payer: Self-pay

## 2021-03-16 NOTE — Telephone Encounter (Signed)
Spoke with Gabriel Cirri CMA about pt and concerns for 2 breathing treatments.  Spoke with Meda Coffee. RMA got some history on patient. Patient does use both medications. Current housing situation has a mold issue and she has had breathing problems so R Orland Mustard has been treating her with both until she's able to find more suitable housing.

## 2021-03-23 ENCOUNTER — Other Ambulatory Visit (INDEPENDENT_AMBULATORY_CARE_PROVIDER_SITE_OTHER): Payer: 59

## 2021-03-23 ENCOUNTER — Other Ambulatory Visit: Payer: Self-pay

## 2021-03-23 ENCOUNTER — Ambulatory Visit (INDEPENDENT_AMBULATORY_CARE_PROVIDER_SITE_OTHER): Payer: 59 | Admitting: Neurology

## 2021-03-23 ENCOUNTER — Encounter: Payer: Self-pay | Admitting: Neurology

## 2021-03-23 VITALS — BP 161/88 | HR 78 | Ht 63.0 in | Wt 104.0 lb

## 2021-03-23 DIAGNOSIS — F250 Schizoaffective disorder, bipolar type: Secondary | ICD-10-CM

## 2021-03-23 DIAGNOSIS — G40009 Localization-related (focal) (partial) idiopathic epilepsy and epileptic syndromes with seizures of localized onset, not intractable, without status epilepticus: Secondary | ICD-10-CM | POA: Diagnosis not present

## 2021-03-23 DIAGNOSIS — M519 Unspecified thoracic, thoracolumbar and lumbosacral intervertebral disc disorder: Secondary | ICD-10-CM

## 2021-03-23 MED ORDER — DIVALPROEX SODIUM 500 MG PO DR TAB: 500.0000 mg | DELAYED_RELEASE_TABLET | Freq: Three times a day (TID) | ORAL | 3 refills | Status: DC

## 2021-03-23 NOTE — Progress Notes (Signed)
 NEUROLOGY FOLLOW UP OFFICE NOTE  Caera Moultrie Heenan [id number] 01/15/1961  HISTORY OF PRESENT ILLNESS: I had the pleasure of seeing Lianni Jilek in follow-up in the neurology clinic on 03/23/2021.  The patient was lost to follow-up and last seen in our office in August 2019, at that time she was started on Lamotrigine. She states she did not like how it made her feel and that she has been taking Depakote 500mg TID prescribed by her psychiatrist. She reported a seizure to her PCP in October 2021. She states that when she gets upset, this sends her in a seizure. She had a seizure around her birthday last month, then had another unwitnessed seizure at home yesterday where she woke up on the ground with bowel and bladder incontinence. She recalls feeling a little funny. Sometimes she feels like her tongue is going back and she cannot pull it back up. She has not seen her psychiatrist in a year for schizoaffective disorder. She states she takes Valium (it is not her medication list today). She lives alone at [facility], "for people with mental disorders." She manages her own medications. She initially reported good compliance to Depakote, then said her last dose was last night (she did not take AM and noon dose today), admitting that she does miss doses. She is concerned about chronic pain in her back and left collarbone/shoulder. She sees Pain Management and takes gabapentin 400mg TID and Percocet. Sometimes her left leg do not want to work and she falls. She notes numbness on the fingers of her left hand, pointing to the left collarbone. Sleep is okay, "depends on stuff making me depressed." She mostly stays home to avoid stress and does good.    History on Initial Assessment 10/15/2015: This is a pleasant 60 yo RH woman with a history of hypertension, schizoaffective disorder, and seizures since childhood, with recurrent seizures. She has "always had seizures." She would usually have a headache or  hear a high pitched sound, followed by loss of hearing, then she loses consciousness and has stiffening and shaking. Sometimes she smells blood and feels sick before a seizure. In the past she could feel them coming on but recently they "just hit." She has injured herself and woken up on the ground, hit her head a few times, with tongue bite and urinary incontinence. Her daughter has never seen any of the seizures, but reports her granddaughter has witnessed the stiffening and shaking, and has seen a couple of episodes where she blanks out for a second or so. The last seizure she is aware of occurred September 2016, however she was told by a friend that she had 2 seizures in her sleep in October 2016, she had woken up feeling sore. Her longest seizure-free interval has been at least 2-3 months. She recalls being on Depakote and Dilantin in the past when she was having seizures "back to back," then when they quieted down some, she was taken off Dilantin and has been on Depakote for many years now. Her daughter reports some compliance issues, a Depakote level in August 2016 was low at 37.1. She is supposed to be taking Depakote DR 1500mg qhs. In the past she was instructed to take it TID, but felt better taking them all at the same time. Her daughter reports that she does not take it regularly because it affects her mood. With her schizoaffective disorder, she reports auditory and visual hallucinations. The visual hallucinations are better, she is still hearing things    NEUROLOGY FOLLOW UP OFFICE NOTE  Caera Moultrie Heenan [id number] 01/15/1961  HISTORY OF PRESENT ILLNESS: I had the pleasure of seeing Lianni Jilek in follow-up in the neurology clinic on 03/23/2021.  The patient was lost to follow-up and last seen in our office in August 2019, at that time she was started on Lamotrigine. She states she did not like how it made her feel and that she has been taking Depakote 500mg TID prescribed by her psychiatrist. She reported a seizure to her PCP in October 2021. She states that when she gets upset, this sends her in a seizure. She had a seizure around her birthday last month, then had another unwitnessed seizure at home yesterday where she woke up on the ground with bowel and bladder incontinence. She recalls feeling a little funny. Sometimes she feels like her tongue is going back and she cannot pull it back up. She has not seen her psychiatrist in a year for schizoaffective disorder. She states she takes Valium (it is not her medication list today). She lives alone at [facility], "for people with mental disorders." She manages her own medications. She initially reported good compliance to Depakote, then said her last dose was last night (she did not take AM and noon dose today), admitting that she does miss doses. She is concerned about chronic pain in her back and left collarbone/shoulder. She sees Pain Management and takes gabapentin 400mg TID and Percocet. Sometimes her left leg do not want to work and she falls. She notes numbness on the fingers of her left hand, pointing to the left collarbone. Sleep is okay, "depends on stuff making me depressed." She mostly stays home to avoid stress and does good.    History on Initial Assessment 10/15/2015: This is a pleasant 60 yo RH woman with a history of hypertension, schizoaffective disorder, and seizures since childhood, with recurrent seizures. She has "always had seizures." She would usually have a headache or  hear a high pitched sound, followed by loss of hearing, then she loses consciousness and has stiffening and shaking. Sometimes she smells blood and feels sick before a seizure. In the past she could feel them coming on but recently they "just hit." She has injured herself and woken up on the ground, hit her head a few times, with tongue bite and urinary incontinence. Her daughter has never seen any of the seizures, but reports her granddaughter has witnessed the stiffening and shaking, and has seen a couple of episodes where she blanks out for a second or so. The last seizure she is aware of occurred September 2016, however she was told by a friend that she had 2 seizures in her sleep in October 2016, she had woken up feeling sore. Her longest seizure-free interval has been at least 2-3 months. She recalls being on Depakote and Dilantin in the past when she was having seizures "back to back," then when they quieted down some, she was taken off Dilantin and has been on Depakote for many years now. Her daughter reports some compliance issues, a Depakote level in August 2016 was low at 37.1. She is supposed to be taking Depakote DR 1500mg qhs. In the past she was instructed to take it TID, but felt better taking them all at the same time. Her daughter reports that she does not take it regularly because it affects her mood. With her schizoaffective disorder, she reports auditory and visual hallucinations. The visual hallucinations are better, she is still hearing things    NEUROLOGY FOLLOW UP OFFICE NOTE  Caera Moultrie Heenan [id number] 01/15/1961  HISTORY OF PRESENT ILLNESS: I had the pleasure of seeing Lianni Jilek in follow-up in the neurology clinic on 03/23/2021.  The patient was lost to follow-up and last seen in our office in August 2019, at that time she was started on Lamotrigine. She states she did not like how it made her feel and that she has been taking Depakote 500mg TID prescribed by her psychiatrist. She reported a seizure to her PCP in October 2021. She states that when she gets upset, this sends her in a seizure. She had a seizure around her birthday last month, then had another unwitnessed seizure at home yesterday where she woke up on the ground with bowel and bladder incontinence. She recalls feeling a little funny. Sometimes she feels like her tongue is going back and she cannot pull it back up. She has not seen her psychiatrist in a year for schizoaffective disorder. She states she takes Valium (it is not her medication list today). She lives alone at [facility], "for people with mental disorders." She manages her own medications. She initially reported good compliance to Depakote, then said her last dose was last night (she did not take AM and noon dose today), admitting that she does miss doses. She is concerned about chronic pain in her back and left collarbone/shoulder. She sees Pain Management and takes gabapentin 400mg TID and Percocet. Sometimes her left leg do not want to work and she falls. She notes numbness on the fingers of her left hand, pointing to the left collarbone. Sleep is okay, "depends on stuff making me depressed." She mostly stays home to avoid stress and does good.    History on Initial Assessment 10/15/2015: This is a pleasant 60 yo RH woman with a history of hypertension, schizoaffective disorder, and seizures since childhood, with recurrent seizures. She has "always had seizures." She would usually have a headache or  hear a high pitched sound, followed by loss of hearing, then she loses consciousness and has stiffening and shaking. Sometimes she smells blood and feels sick before a seizure. In the past she could feel them coming on but recently they "just hit." She has injured herself and woken up on the ground, hit her head a few times, with tongue bite and urinary incontinence. Her daughter has never seen any of the seizures, but reports her granddaughter has witnessed the stiffening and shaking, and has seen a couple of episodes where she blanks out for a second or so. The last seizure she is aware of occurred September 2016, however she was told by a friend that she had 2 seizures in her sleep in October 2016, she had woken up feeling sore. Her longest seizure-free interval has been at least 2-3 months. She recalls being on Depakote and Dilantin in the past when she was having seizures "back to back," then when they quieted down some, she was taken off Dilantin and has been on Depakote for many years now. Her daughter reports some compliance issues, a Depakote level in August 2016 was low at 37.1. She is supposed to be taking Depakote DR 1500mg qhs. In the past she was instructed to take it TID, but felt better taking them all at the same time. Her daughter reports that she does not take it regularly because it affects her mood. With her schizoaffective disorder, she reports auditory and visual hallucinations. The visual hallucinations are better, she is still hearing things    NEUROLOGY FOLLOW UP OFFICE NOTE  Caera Moultrie Heenan [id number] 01/15/1961  HISTORY OF PRESENT ILLNESS: I had the pleasure of seeing Lianni Jilek in follow-up in the neurology clinic on 03/23/2021.  The patient was lost to follow-up and last seen in our office in August 2019, at that time she was started on Lamotrigine. She states she did not like how it made her feel and that she has been taking Depakote 500mg TID prescribed by her psychiatrist. She reported a seizure to her PCP in October 2021. She states that when she gets upset, this sends her in a seizure. She had a seizure around her birthday last month, then had another unwitnessed seizure at home yesterday where she woke up on the ground with bowel and bladder incontinence. She recalls feeling a little funny. Sometimes she feels like her tongue is going back and she cannot pull it back up. She has not seen her psychiatrist in a year for schizoaffective disorder. She states she takes Valium (it is not her medication list today). She lives alone at [facility], "for people with mental disorders." She manages her own medications. She initially reported good compliance to Depakote, then said her last dose was last night (she did not take AM and noon dose today), admitting that she does miss doses. She is concerned about chronic pain in her back and left collarbone/shoulder. She sees Pain Management and takes gabapentin 400mg TID and Percocet. Sometimes her left leg do not want to work and she falls. She notes numbness on the fingers of her left hand, pointing to the left collarbone. Sleep is okay, "depends on stuff making me depressed." She mostly stays home to avoid stress and does good.    History on Initial Assessment 10/15/2015: This is a pleasant 60 yo RH woman with a history of hypertension, schizoaffective disorder, and seizures since childhood, with recurrent seizures. She has "always had seizures." She would usually have a headache or  hear a high pitched sound, followed by loss of hearing, then she loses consciousness and has stiffening and shaking. Sometimes she smells blood and feels sick before a seizure. In the past she could feel them coming on but recently they "just hit." She has injured herself and woken up on the ground, hit her head a few times, with tongue bite and urinary incontinence. Her daughter has never seen any of the seizures, but reports her granddaughter has witnessed the stiffening and shaking, and has seen a couple of episodes where she blanks out for a second or so. The last seizure she is aware of occurred September 2016, however she was told by a friend that she had 2 seizures in her sleep in October 2016, she had woken up feeling sore. Her longest seizure-free interval has been at least 2-3 months. She recalls being on Depakote and Dilantin in the past when she was having seizures "back to back," then when they quieted down some, she was taken off Dilantin and has been on Depakote for many years now. Her daughter reports some compliance issues, a Depakote level in August 2016 was low at 37.1. She is supposed to be taking Depakote DR 1500mg qhs. In the past she was instructed to take it TID, but felt better taking them all at the same time. Her daughter reports that she does not take it regularly because it affects her mood. With her schizoaffective disorder, she reports auditory and visual hallucinations. The visual hallucinations are better, she is still hearing things

## 2021-03-23 NOTE — Patient Instructions (Signed)
1. Bloodwork for Depakote level, CBC, CMP  2. Refills for Depakote 500mg  three times a day have been sent to your pharmacy. Keep an alarm as a reminder so you don't miss any doses.  3. Referral will be sent to Psychiatry and Neurosurgery  4. Follow-up in 6 months, call for any changes   Seizure Precautions: 1. If medication has been prescribed for you to prevent seizures, take it exactly as directed.  Do not stop taking the medicine without talking to your doctor first, even if you have not had a seizure in a long time.   2. Avoid activities in which a seizure would cause danger to yourself or to others.  Don't operate dangerous machinery, swim alone, or climb in high or dangerous places, such as on ladders, roofs, or girders.  Do not drive unless your doctor says you may.  3. If you have any warning that you may have a seizure, lay down in a safe place where you can't hurt yourself.    4.  No driving for 6 months from last seizure, as per [facility].   Please refer to the following link on the [place] website for more information: [email or web address]/[email or web address]   5.  Maintain good sleep hygiene. Avoid alcohol.  6.  Contact your doctor if you have any problems that may be related to the medicine you are taking.  7.  Call 911 and bring the patient back to the ED if:        A.  The seizure lasts longer than 5 minutes.       B.  The patient doesn't awaken shortly after the seizure  C.  The patient has new problems such as difficulty seeing, speaking or moving  D.  The patient was injured during the seizure  E.  The patient has a temperature over 102 F (39C)  F.  The patient vomited and now is having trouble breathing

## 2021-04-02 ENCOUNTER — Ambulatory Visit (INDEPENDENT_AMBULATORY_CARE_PROVIDER_SITE_OTHER): Payer: 59 | Admitting: Registered Nurse

## 2021-04-02 ENCOUNTER — Other Ambulatory Visit (HOSPITAL_COMMUNITY)
Admission: RE | Admit: 2021-04-02 | Discharge: 2021-04-02 | Disposition: A | Discharge status: Home / Self Care | Payer: 59 | Source: Ambulatory Visit | Attending: Registered Nurse | Admitting: Registered Nurse

## 2021-04-02 ENCOUNTER — Other Ambulatory Visit: Payer: Self-pay | Admitting: Registered Nurse

## 2021-04-02 ENCOUNTER — Other Ambulatory Visit: Payer: Self-pay

## 2021-04-02 VITALS — BP 134/80 | HR 74 | Temp 98.1°F | Resp 16 | Ht 63.0 in | Wt 106.4 lb

## 2021-04-02 DIAGNOSIS — Z8619 Personal history of other infectious and parasitic diseases: Secondary | ICD-10-CM | POA: Insufficient documentation

## 2021-04-02 DIAGNOSIS — F419 Anxiety disorder, unspecified: Secondary | ICD-10-CM

## 2021-04-02 DIAGNOSIS — F17200 Nicotine dependence, unspecified, uncomplicated: Secondary | ICD-10-CM

## 2021-04-02 DIAGNOSIS — R0602 Shortness of breath: Secondary | ICD-10-CM

## 2021-04-02 DIAGNOSIS — F250 Schizoaffective disorder, bipolar type: Secondary | ICD-10-CM

## 2021-04-02 DIAGNOSIS — W19XXXA Unspecified fall, initial encounter: Secondary | ICD-10-CM

## 2021-04-02 DIAGNOSIS — I951 Orthostatic hypotension: Secondary | ICD-10-CM

## 2021-04-02 DIAGNOSIS — F325 Major depressive disorder, single episode, in full remission: Secondary | ICD-10-CM | POA: Diagnosis not present

## 2021-04-02 DIAGNOSIS — I10 Essential (primary) hypertension: Secondary | ICD-10-CM

## 2021-04-02 DIAGNOSIS — M25559 Pain in unspecified hip: Secondary | ICD-10-CM

## 2021-04-02 DIAGNOSIS — J431 Panlobular emphysema: Secondary | ICD-10-CM

## 2021-04-02 DIAGNOSIS — R350 Frequency of micturition: Secondary | ICD-10-CM | POA: Diagnosis not present

## 2021-04-02 DIAGNOSIS — M545 Low back pain, unspecified: Secondary | ICD-10-CM

## 2021-04-02 DIAGNOSIS — R5383 Other fatigue: Secondary | ICD-10-CM

## 2021-04-02 DIAGNOSIS — G8929 Other chronic pain: Secondary | ICD-10-CM

## 2021-04-02 DIAGNOSIS — M25522 Pain in left elbow: Secondary | ICD-10-CM

## 2021-04-02 MED ORDER — QUETIAPINE FUMARATE ER 50 MG PO TB24: 50.0000 mg | ORAL_TABLET | Freq: Every day | ORAL | 3 refills | Status: DC

## 2021-04-02 MED ORDER — MIRABEGRON ER 50 MG PO TB24: 50.0000 mg | ORAL_TABLET | Freq: Every day | ORAL | 1 refills | Status: DC

## 2021-04-02 MED ORDER — CITALOPRAM HYDROBROMIDE 40 MG PO TABS: 20.0000 mg | ORAL_TABLET | Freq: Every day | ORAL | 3 refills | Status: DC

## 2021-04-02 MED ORDER — HYDROXYZINE HCL 10 MG PO TABS: 10.0000 mg | ORAL_TABLET | Freq: Three times a day (TID) | ORAL | 5 refills | Status: DC | PRN

## 2021-04-02 NOTE — Patient Instructions (Signed)
Ms Littles -   As always, great to see you. Glad you could join [place] in [place].   Labs today include: CBC, CMP, A1c, TSH, and Lipid Panel. We're also testing for HIV, syphilis, chlamydia, gonorrhea, and trichomonas. I expect these results will be in tomorrow or Monday. I'll call you if I have concerns.  I have refilled the Myrbetriq and seroquel. I have increased your celexa to help with anxiety. I have added hydroxyzine for breakthrough anxiety - use 1/2-1 tablet up to three times daily as needed for anxiety.  I have placed a referral to psychiatry.   I will continue to work to get home health established for you  Thank you  Denice Paradise

## 2021-04-03 LAB — CBC WITH DIFFERENTIAL/PLATELET
Basophils Absolute: 0.1 10*3/uL (ref 0.0–0.1)
Basophils Relative: 1.3 % (ref 0.0–3.0)
Eosinophils Absolute: 0 10*3/uL (ref 0.0–0.7)
Eosinophils Relative: 0.9 % (ref 0.0–5.0)
HCT: 40.5 % (ref 36.0–46.0)
Hemoglobin: 14 g/dL (ref 12.0–15.0)
Lymphocytes Relative: 25.4 % (ref 12.0–46.0)
Lymphs Abs: 1.1 10*3/uL (ref 0.7–4.0)
MCHC: 34.5 g/dL (ref 30.0–36.0)
MCV: 105.5 fl — ABNORMAL HIGH (ref 78.0–100.0)
Monocytes Absolute: 0.4 10*3/uL (ref 0.1–1.0)
Monocytes Relative: 8.8 % (ref 3.0–12.0)
Neutro Abs: 2.9 10*3/uL (ref 1.4–7.7)
Neutrophils Relative %: 63.6 % (ref 43.0–77.0)
Platelets: 207 10*3/uL (ref 150.0–400.0)
RBC: 3.84 Mil/uL — ABNORMAL LOW (ref 3.87–5.11)
RDW: 13.1 % (ref 11.5–15.5)
WBC: 4.5 10*3/uL (ref 4.0–10.5)

## 2021-04-03 LAB — COMPREHENSIVE METABOLIC PANEL
ALT: 48 U/L — ABNORMAL HIGH (ref 0–35)
AST: 81 U/L — ABNORMAL HIGH (ref 0–37)
Albumin: 4.5 g/dL (ref 3.5–5.2)
Alkaline Phosphatase: 85 U/L (ref 39–117)
BUN: 4 mg/dL — ABNORMAL LOW (ref 6–23)
CO2: 30 mEq/L (ref 19–32)
Calcium: 10.3 mg/dL (ref 8.4–10.5)
Chloride: 98 mEq/L (ref 96–112)
Creatinine, Ser: 0.87 mg/dL (ref 0.40–1.20)
GFR: 71.56 mL/min (ref >60.00)
Glucose, Bld: 81 mg/dL (ref 70–99)
Potassium: 3.6 mEq/L (ref 3.5–5.1)
Sodium: 138 mEq/L (ref 135–145)
Total Bilirubin: 0.5 mg/dL (ref 0.2–1.2)
Total Protein: 8.2 g/dL (ref 6.0–8.3)

## 2021-04-03 LAB — LIPID PANEL
Cholesterol: 184 mg/dL (ref 0–200)
HDL: 77.7 mg/dL (ref >39.00)
LDL Cholesterol: 91 mg/dL (ref 0–99)
NonHDL: 106.37
Total CHOL/HDL Ratio: 2
Triglycerides: 75 mg/dL (ref 0.0–149.0)
VLDL: 15 mg/dL (ref 0.0–40.0)

## 2021-04-03 LAB — URINALYSIS
Bilirubin Urine: NEGATIVE
Hgb urine dipstick: NEGATIVE
Ketones, ur: NEGATIVE
Leukocytes,Ua: NEGATIVE
Nitrite: NEGATIVE
Specific Gravity, Urine: <=1.005 — AB (ref 1.000–1.030)
Total Protein, Urine: NEGATIVE
Urine Glucose: NEGATIVE
Urobilinogen, UA: 0.2 (ref 0.0–1.0)
pH: 6.5 (ref 5.0–8.0)

## 2021-04-03 LAB — HEMOGLOBIN A1C: Hgb A1c MFr Bld: 5.2 % (ref 4.6–6.5)

## 2021-04-03 LAB — TSH: TSH: 0.28 u[IU]/mL — ABNORMAL LOW (ref 0.35–4.50)

## 2021-04-06 ENCOUNTER — Ambulatory Visit: Payer: 59 | Admitting: Neurology

## 2021-04-06 ENCOUNTER — Encounter: Payer: Self-pay | Admitting: Registered Nurse

## 2021-04-06 LAB — HIV ANTIBODY (ROUTINE TESTING W REFLEX): HIV 1&2 Ab, 4th Generation: NONREACTIVE

## 2021-04-06 LAB — URINE CYTOLOGY ANCILLARY ONLY
Chlamydia: NEGATIVE
Comment: NEGATIVE
Comment: NEGATIVE
Comment: NORMAL
Neisseria Gonorrhea: NEGATIVE
Trichomonas: NEGATIVE

## 2021-04-06 LAB — RPR: RPR Ser Ql: NONREACTIVE

## 2021-04-06 NOTE — Progress Notes (Signed)
Established Patient Office Visit  Subjective:  Patient ID: Melissa Bridges, female    DOB: [birth year]  Age: 60 y.o. MRN: [id number]  CC:  Chief Complaint  Patient presents with  . Arm Injury    Pt repots had a fall and injured Lt arm saw Pain management and they increased her pain medication but took no Xray pt reports this 1 month ago and still painful.   . Referral    Pt also would like to discuss getting an in home health referral for aid, this has been discussed previously per pt     HPI [facility] Shareef presents for referral, elbow pain, med refills  Refills: needs refills on a variety of medications. List reviewed with patient. She denies AEs. Not sedative naive - opioids being managed through pain management. They did recently increase her dose of percocet.  Otherwise, notes she is feeling a little more anxious lately. Only taking 59m of citalopram. Willing to increase to 289mPO qd.   Elbow pain: fall at home around 1 mo ago maybe more. Still having pain in L elbow. Gestures to lateral epicondylitis. Reports ttp. Better at rest. Worse with activity. Has been keeping this still and trying to limit use. Would like imaging to rule out fx. No ongoing swelling, heat, warmth, or bruising.  Referral: checking on status of home health referral. Let patient know that referral was declined initially but is pending further review. Agree with her need for home health related to multiple falls at home, high risk medication use, trouble ambulating, trouble with ADLs, trouble with fine motor skills as we have documented in the past.  Past Medical History:  Diagnosis Date  . Allergy   . Alopecia   . Anxiety   . Arthritis   . Asthma   . Asthma    Phreesia 10/22/2020  . Cataract    "mild"  . COPD (chronic obstructive pulmonary disease) ([facility]  . Depression   . Depression    Phreesia 10/22/2020  . GERD (gastroesophageal reflux disease)    o cc- takes OTC if needed.  .  Glaucoma   . Hepatitis C   . Hypertension    onset age 60 . Iron deficiency anemia   . Migraine   . Schizoaffective disorder ([facility]  . Sciatic pain   . Seizures ([facility]   onset in childhood. 06-03-16- per pt 8 months agoGeneralized tonic-clonic.Last seizure couple of months ago- last sz 9-10 months ago per pt 10-12-2016  . Shortness of breath dyspnea   . Substance abuse ([facility]  . Ulcer     Past Surgical History:  Procedure Laterality Date  . ABDOMINAL HYSTERECTOMY     ovarian cyst B, cervical dysplasia, fibroids.   Ovaries intact.  . Marland KitchenIOPSY  07/11/2018   Procedure: BIOPSY;  Surgeon: ArYetta FlockMD;  Location: [facility];  Service: Gastroenterology;;  . COLONOSCOPY    . COLONOSCOPY W/ BIOPSIES    . DILATION AND CURETTAGE OF UTERUS     x2  . ESOPHAGOGASTRODUODENOSCOPY (EGD) WITH PROPOFOL N/A 07/11/2018   Procedure: ESOPHAGOGASTRODUODENOSCOPY (EGD) WITH PROPOFOL;  Surgeon: ArYetta FlockMD;  Location: [facility];  Service: Gastroenterology;  Laterality: N/A;  . EXPLORATORY LAPAROTOMY     x3  . GYNECOLOGIC CRYOSURGERY     x3  . HOT HEMOSTASIS N/A 07/11/2018   Procedure: HOT HEMOSTASIS (ARGON PLASMA COAGULATION/BICAP);  Surgeon: ArYetta FlockMD;  [facility];  Service: Gastroenterology;  Laterality:  N/A;  . MANDIBLE RECONSTRUCTION N/A 06/27/2015   Procedure: REMOVAL MAXILLARY PALATAL TORUS.  REMOVAL MANDIBULAR TORUS AND EXOSTOSIS.  ;  Surgeon: Diona Browner, DDS;  Location: [place];  Service: Oral Surgery;  Laterality: N/A;  . OVARIAN CYST REMOVAL  2008  . POLYPECTOMY      Family History  Problem Relation Age of Onset  . Diabetes type II Mother   . Hypertension Mother   . Arthritis Mother   . Diabetes Mother   . Mental illness Mother        bipolar  . Diabetes type II Maternal Aunt   . Hypertension Father   . Heart murmur Father   . Arthritis Father   . Stroke Father   . Alopecia Sister   . Alopecia Brother   . Alopecia Sister   .  Mental retardation Sister        depression  . Prostate cancer Paternal Grandfather   . Cancer Maternal Uncle   . Colon cancer Neg Hx   . Esophageal cancer Neg Hx   . Rectal cancer Neg Hx   . Stomach cancer Neg Hx   . Colon polyps Neg Hx     Social History   Socioeconomic History  . Marital status: Widowed    Spouse name: Not on file  . Number of children: 1  . Years of education: Not on file  . Highest education level: Not on file  Occupational History  . Occupation: disabled    Comment: mental illness; seizures  Tobacco Use  . Smoking status: Current Every Day Smoker    Packs/day: 0.33    Years: 44.00    Pack years: 14.52    Types: Cigarettes  . Smokeless tobacco: Never Used  Vaping Use  . Vaping Use: Never used  Substance and Sexual Activity  . Alcohol use: Yes    Alcohol/week: 0.0 standard drinks    Comment: 1 pakj every 3 days  . Drug use: Not Currently    Types: Marijuana, Cocaine    Comment: not used in 5 months   . Sexual activity: Not Currently    Birth control/protection: None    Comment: widow  Other Topics Concern  . Not on file  Social History Narrative   Marital status:  Widowed since 2002.  Married x 16 years. + dating.  Moved from [place] to live with daughter in 2013.     Children:  One child/daughter (33); two grandchildren.      Lives: with daughter, grandchildren 2.  Does not drive due to epilepsy.      Employment:  Disability for schizoaffective disorder.      Tobacco: 1 ppd x since 8th grade.      Alcohol:  Social; rare drinking due to seizure medications.  Weekends.       Drugs: none; previous use of marijuana.  Previous iv drug use, cocaine.      Exercise: none      Seatbelt:  100%      Guns: none   Social Determinants of Radio broadcast assistant Strain: Not on file  Food Insecurity: Not on file  Transportation Needs: Not on file  Physical Activity: Not on file  Stress: Not on file  Social Connections: Not on file   Intimate Partner Violence: Not on file    Outpatient Medications Prior to Visit  Medication Sig Dispense Refill  . acetaminophen (TYLENOL) 500 MG tablet Take 2,000-2,500 mg by mouth every 4 (four) hours as needed (pain).    [name]  albuterol (VENTOLIN HFA) 108 (90 Base) MCG/ACT inhaler INHALE 2 PUFFS BY MOUTH EVERY 6 HOURS AS NEEDED FOR SHORTNESS OF BREATH 8.5 each 3  . Blood Pressure Monitor KIT Dispense one blood pressure monitor to blood pressure 1 kit 0  . budesonide-formoterol (SYMBICORT) 80-4.5 MCG/ACT inhaler Inhale 2 puffs into the lungs 2 (two) times daily.    . divalproex (DEPAKOTE) 500 MG DR tablet Take 1 tablet (500 mg total) by mouth 3 (three) times daily. 270 tablet 3  . gabapentin (NEURONTIN) 400 MG capsule Take 1 capsule (400 mg total) by mouth 3 (three) times daily. 270 capsule 1  . ipratropium-albuterol (DUONEB) 0.5-2.5 (3) MG/3ML SOLN Take 3 mLs by nebulization every 6 (six) hours as needed (shortness of breath). Reported on 12/02/2015 360 mL 1  . methocarbamol (ROBAXIN) 500 MG tablet TAKE 1 TABLET (500 MG TOTAL) BY MOUTH 4 (FOUR) TIMES DAILY. 60 tablet 0  . montelukast (SINGULAIR) 10 MG tablet TAKE 1 TABLET BY MOUTH EVERYDAY AT BEDTIME 90 tablet 1  . Multiple Vitamin (MULTIVITAMIN WITH MINERALS) TABS tablet Take 1 tablet by mouth daily. 30 tablet 1  . ondansetron (ZOFRAN) 4 MG tablet Take 1 tablet (4 mg total) by mouth every 6 (six) hours. 12 tablet 0  . oxyCODONE-acetaminophen (PERCOCET) 10-325 MG tablet Take 1 tablet by mouth 5 (five) times daily.    . pantoprazole (PROTONIX) 20 MG tablet TAKE 1 TABLET BY MOUTH EVERY DAY 90 tablet 0  . thiamine 100 MG tablet Take 1 tablet (100 mg total) by mouth daily. 90 tablet 1  . tiZANidine (ZANAFLEX) 4 MG tablet TAKE 1 TABLET (4 MG TOTAL) BY MOUTH 3 (THREE) TIMES DAILY AS NEEDED FOR MUSCLE SPASMS. 90 tablet 3  . triamcinolone cream (KENALOG) 0.1 % APPLY CREAM EXTERNALLY TWICE DAILY (Patient taking differently: Apply 1 application topically  2 (two) times daily as needed (rash/irritation).) 30 g 0  . valACYclovir (VALTREX) 1000 MG tablet TAKE 2 TABLETS BY MOUTH AT SYMPTOM ONSET, REPEAT IN 12 HOURS 20 tablet 2  . vitamin C (ASCORBIC ACID) 250 MG tablet Take 250 mg by mouth daily.    . citalopram (CELEXA) 20 MG tablet Take 1 tablet (20 mg total) by mouth daily. 90 tablet 0  . metroNIDAZOLE (FLAGYL) 500 MG tablet Take 1 tablet (500 mg total) by mouth 2 (two) times daily. 14 tablet 0  . QUEtiapine Fumarate (SEROQUEL XR) 50 MG TB24 24 hr tablet Take 1 tablet (50 mg total) by mouth at bedtime. 30 tablet 3  . SYMBICORT 160-4.5 MCG/ACT inhaler TAKE 2 PUFFS BY MOUTH TWICE A DAY (Patient not taking: No sig reported) 10.2 each 2   No facility-administered medications prior to visit.    Allergies  Allergen Reactions  . Fruit & Vegetable Daily [Nutritional Supplements] Shortness Of Breath    Reaction to Aloe  . Iodinated Diagnostic Agents Swelling    Swelling and itching of left side of her face only after CT SI injection(no steroid used)  . Ibuprofen Other (See Comments)    Causes Stomach upset; However, pt can take Meloxicam without incident and takes this at home.   . Aloe Vera Rash  . Aspirin Rash and Other (See Comments)    Stomach upset    ROS Review of Systems  Constitutional: Negative.   HENT: Negative.   Eyes: Negative.   Respiratory: Negative.   Cardiovascular: Negative.   Gastrointestinal: Negative.   Endocrine: Negative.   Genitourinary: Negative.   Musculoskeletal: Negative.   Skin: Negative.  Allergic/Immunologic: Negative.   Neurological: Negative.   Hematological: Negative.   Psychiatric/Behavioral: Negative.   All other systems reviewed and are negative.     Objective:    Physical Exam Vitals and nursing note reviewed.  Constitutional:      General: She is not in acute distress.    Appearance: Normal appearance. She is normal weight. She is not ill-appearing, toxic-appearing or diaphoretic.   Cardiovascular:     Rate and Rhythm: Normal rate and regular rhythm.     Heart sounds: Normal heart sounds. No murmur heard. No friction rub. No gallop.   Pulmonary:     Effort: Pulmonary effort is normal. No respiratory distress.     Breath sounds: Normal breath sounds. No stridor. No wheezing, rhonchi or rales.  Chest:     Chest wall: No tenderness.  Skin:    General: Skin is warm and dry.  Neurological:     General: No focal deficit present.     Mental Status: She is alert and oriented to person, place, and time. Mental status is at baseline.  Psychiatric:        Mood and Affect: Mood normal.        Behavior: Behavior normal.        Thought Content: Thought content normal.        Judgment: Judgment normal.     BP 134/80   Pulse 74   Temp 98.1 F (36.7 C) (Temporal)   Resp 16   Ht 5' 3"  (1.6 m)   Wt 106 lb 6.4 oz (48.3 kg)   SpO2 99%   BMI 18.85 kg/m  Wt Readings from Last 3 Encounters:  04/02/21 106 lb 6.4 oz (48.3 kg)  03/23/21 104 lb (47.2 kg)  10/22/20 113 lb 1.6 oz (51.3 kg)     There are no preventive care reminders to display for this patient.  There are no preventive care reminders to display for this patient.  Lab Results  Component Value Date   TSH 0.28 (L) 04/02/2021   Lab Results  Component Value Date   WBC 4.5 04/02/2021   HGB 14.0 04/02/2021   HCT 40.5 04/02/2021   MCV 105.5 (H) 04/02/2021   PLT 207.0 04/02/2021   Lab Results  Component Value Date   NA 138 04/02/2021   K 3.6 04/02/2021   CO2 30 04/02/2021   GLUCOSE 81 04/02/2021   BUN 4 (L) 04/02/2021   CREATININE 0.87 04/02/2021   BILITOT 0.5 04/02/2021   ALKPHOS 85 04/02/2021   AST 81 (H) 04/02/2021   ALT 48 (H) 04/02/2021   PROT 8.2 04/02/2021   ALBUMIN 4.5 04/02/2021   CALCIUM 10.3 04/02/2021   ANIONGAP 10 10/22/2020   GFR 71.56 04/02/2021   Lab Results  Component Value Date   CHOL 184 04/02/2021   Lab Results  Component Value Date   HDL 77.70 04/02/2021   Lab  Results  Component Value Date   LDLCALC 91 04/02/2021   Lab Results  Component Value Date   TRIG 75.0 04/02/2021   Lab Results  Component Value Date   CHOLHDL 2 04/02/2021   Lab Results  Component Value Date   HGBA1C 5.2 04/02/2021      Assessment & Plan:   Problem List Items Addressed This Visit      Cardiovascular and Mediastinum   Hypertension   Relevant Orders   Comprehensive metabolic panel (Completed)   CBC with Differential/Platelet (Completed)   Orthostatic hypotension   Relevant Orders   Ambulatory  referral to Home Health   TSH (Completed)     Respiratory   COPD (chronic obstructive pulmonary disease) ([place])   Relevant Orders   Ambulatory referral to [place]     Other   Smoker   Relevant Orders   Hemoglobin A1c (Completed)   Lipid panel (Completed)   Schizoaffective disorder, bipolar type ([place]) - Primary   Relevant Medications   QUEtiapine (SEROQUEL XR) 50 MG TB24 24 hr tablet   citalopram (CELEXA) 40 MG tablet   Other Relevant Orders   Ambulatory referral to Psychiatry   TSH (Completed)   Lower back pain   Relevant Orders   Ambulatory referral to [place]   Other fatigue   Relevant Orders   Ambulatory referral to [place]    Other Visit Diagnoses    Anxiety       Relevant Medications   citalopram (CELEXA) 40 MG tablet   hydrOXYzine (ATARAX/VISTARIL) 10 MG tablet   Other Relevant Orders   Ambulatory referral to Psychiatry   Major depressive disorder in remission, unspecified whether recurrent ([place])       Relevant Medications   citalopram (CELEXA) 40 MG tablet   hydrOXYzine (ATARAX/VISTARIL) 10 MG tablet   Other Relevant Orders   Ambulatory referral to Psychiatry   TSH (Completed)   Hip pain       Relevant Orders   Ambulatory referral to [place], initial encounter       Relevant Orders   Ambulatory referral to [place] of breath       Relevant Orders   Ambulatory referral to [place]    Frequency of micturition       Relevant Medications   mirabegron ER (MYRBETRIQ) 50 MG TB24 tablet   Other Relevant Orders   Urinalysis (Completed)   History of sexually transmitted disease       Relevant Orders   HIV antibody (with reflex)   RPR (Completed)   Urine cytology ancillary only([place])   Left elbow pain       Relevant Orders   DG Elbow Complete Left      Meds ordered this encounter  Medications  . QUEtiapine (SEROQUEL XR) 50 MG TB24 24 hr tablet    Sig: Take 1 tablet (50 mg total) by mouth at bedtime.    Dispense:  30 tablet    Refill:  3    Order Specific Question:   Supervising Provider    Answer:   Carlota Raspberry, JEFFREY R [[id number]]  . citalopram (CELEXA) 40 MG tablet    Sig: Take 0.5 tablets (20 mg total) by mouth daily.    Dispense:  90 tablet    Refill:  3    Order Specific Question:   Supervising Provider    Answer:   Carlota Raspberry, JEFFREY R [[id number]]  . hydrOXYzine (ATARAX/VISTARIL) 10 MG tablet    Sig: Take 1 tablet (10 mg total) by mouth 3 (three) times daily as needed.    Dispense:  30 tablet    Refill:  5    Order Specific Question:   Supervising Provider    Answer:   Carlota Raspberry, JEFFREY R [[id number]]  . mirabegron ER (MYRBETRIQ) 50 MG TB24 tablet    Sig: Take 1 tablet (50 mg total) by mouth daily.    Dispense:  90 tablet    Refill:  1    Order Specific Question:   Supervising Provider    Answer:   Carlota Raspberry, JEFFREY R [[id number]]  Follow-up: No follow-ups on file.   PLAN  Refill meds as above  We have not filled Myrbetriq through our office before but pt has been on for some time. Aware of use and risks, benefits, and side effects.  Will increase citalopram to 31m po qd  Labs collected. Will follow up with the patient as warranted.  Referral sent - will keep patient aware of any changes.  Follow up in 3 mo for chronic conditions  Patient encouraged to call clinic with any questions, comments, or concerns.   RMaximiano Coss NP

## 2021-04-07 ENCOUNTER — Ambulatory Visit: Payer: 59 | Admitting: Neurology

## 2021-04-28 ENCOUNTER — Telehealth: Payer: Self-pay | Admitting: Registered Nurse

## 2021-04-28 NOTE — Telephone Encounter (Signed)
Please discharge from [facility] - Does not respond to phone calls and is not there when they go by to see her.

## 2021-04-29 ENCOUNTER — Telehealth: Payer: Self-pay | Admitting: Registered Nurse

## 2021-04-29 NOTE — Telephone Encounter (Signed)
..  Home Health Certification or Plan of Care Tracking  Is this a Certification or Plan of Care?  Yes  [facility] Agency:[facility]  Order Number:  [id number]  Has charge sheet been attached? Yes  Where has form been placed:  Placed in Richard's bin up front

## 2021-04-30 NOTE — Telephone Encounter (Signed)
I have received this paperwork from the front bin and will get it faxed back.

## 2021-05-08 ENCOUNTER — Telehealth: Payer: Self-pay | Admitting: Registered Nurse

## 2021-05-08 NOTE — Telephone Encounter (Signed)
Attempted to schedule AWV. Unable to LVM.  Will try at later time.  Line was busy

## 2021-06-03 ENCOUNTER — Other Ambulatory Visit: Payer: Self-pay

## 2021-06-03 ENCOUNTER — Ambulatory Visit (INDEPENDENT_AMBULATORY_CARE_PROVIDER_SITE_OTHER): Payer: 59 | Admitting: Registered Nurse

## 2021-06-03 ENCOUNTER — Other Ambulatory Visit (HOSPITAL_COMMUNITY)
Admission: RE | Admit: 2021-06-03 | Discharge: 2021-06-03 | Disposition: A | Discharge status: Home / Self Care | Payer: 59 | Source: Ambulatory Visit | Attending: Registered Nurse | Admitting: Registered Nurse

## 2021-06-03 ENCOUNTER — Encounter: Payer: Self-pay | Admitting: Registered Nurse

## 2021-06-03 VITALS — BP 162/96 | HR 84 | Temp 98.2°F | Resp 18 | Ht 63.0 in | Wt 106.4 lb

## 2021-06-03 DIAGNOSIS — R300 Dysuria: Secondary | ICD-10-CM

## 2021-06-03 DIAGNOSIS — N3001 Acute cystitis with hematuria: Secondary | ICD-10-CM

## 2021-06-03 DIAGNOSIS — B370 Candidal stomatitis: Secondary | ICD-10-CM | POA: Diagnosis not present

## 2021-06-03 DIAGNOSIS — M25552 Pain in left hip: Secondary | ICD-10-CM | POA: Diagnosis not present

## 2021-06-03 DIAGNOSIS — G8929 Other chronic pain: Secondary | ICD-10-CM | POA: Diagnosis not present

## 2021-06-03 LAB — POCT URINALYSIS DIP (CLINITEK)
Bilirubin, UA: NEGATIVE
Glucose, UA: NEGATIVE mg/dL
Ketones, POC UA: NEGATIVE mg/dL
Nitrite, UA: NEGATIVE
Spec Grav, UA: <=1.005 — AB (ref 1.010–1.025)
Urobilinogen, UA: 0.2 E.U./dL
pH, UA: 6 (ref 5.0–8.0)

## 2021-06-03 MED ORDER — TRAMADOL HCL 50 MG PO TABS: 50.0000 mg | ORAL_TABLET | Freq: Three times a day (TID) | ORAL | 0 refills | Status: DC | PRN

## 2021-06-03 MED ORDER — NITROFURANTOIN MONOHYD MACRO 100 MG PO CAPS: 100.0000 mg | ORAL_CAPSULE | Freq: Two times a day (BID) | ORAL | 0 refills | Status: DC

## 2021-06-03 MED ORDER — NITROFURANTOIN MONOHYD MACRO 100 MG PO CAPS: 100.0000 mg | ORAL_CAPSULE | Freq: Two times a day (BID) | ORAL | 0 refills | Status: AC

## 2021-06-03 MED ORDER — METHYLPREDNISOLONE ACETATE 80 MG/ML IJ SUSP
80.0000 mg | Freq: Once | INTRAMUSCULAR | Status: AC
  Administered 2021-06-03: 80 mg via INTRAMUSCULAR

## 2021-06-03 MED ORDER — TRAMADOL HCL 50 MG PO TABS: 50.0000 mg | ORAL_TABLET | Freq: Three times a day (TID) | ORAL | 0 refills | Status: AC | PRN

## 2021-06-03 MED ORDER — NYSTATIN 100000 UNIT/ML MT SUSP: 5.0000 mL | Freq: Four times a day (QID) | OROMUCOSAL | 0 refills | Status: DC

## 2021-06-03 NOTE — Patient Instructions (Addendum)
Melissa Bridges -  In brief:  Macrobid 100mg  twice daily for UTI. I will send urine culture and STI testing. I will contact you with relevant results.  Dr. Kary Kos - Neurosurgeon - [phone]  Home Health - Referral Sent  Dr. Johnsie Kindred - Urology - [phone]  Dr. Erlinda Hong - Ortho (hip/shoulder) - [phone]  [facility] clinic - psychiatry - [phone]   Give these folks a call and they'll take good care of you  Thank you  [name]     If you have lab work done today you will be contacted with your lab results within the next 2 weeks.  If you have not heard from [place] then please contact us. The fastest way to get your results is to register for My Chart.   IF you received an x-ray today, you will receive an invoice from [facility]. Please contact [facility] at [phone] with questions or concerns regarding your invoice.   IF you received labwork today, you will receive an invoice from [facility]. Please contact [facility] at [phone] with questions or concerns regarding your invoice.   Our billing staff will not be able to assist you with questions regarding bills from these companies.  You will be contacted with the lab results as soon as they are available. The fastest way to get your results is to activate your My Chart account. Instructions are located on the last page of this paperwork. If you have not heard from [place] regarding the results in 2 weeks, please contact this office.

## 2021-06-03 NOTE — Progress Notes (Signed)
Acute Office Visit  Subjective:    Patient ID: Melissa Bridges, female    DOB: 19-Feb-1961, 60 y.o.   MRN: [id number]  Chief Complaint  Patient presents with   Urinary Tract Infection    Patient states she thinks she has an UTI and would like to discuss bone and muscle pain.    HPI Patient is in today for dysuria  Hx of frequent UTI Dysuria over one week Mild flank pain No fevers, chills, fatigue, sweats No nvd  Also notes ongoing pain. Feels bone and muscle pain Has seen Dr. Lynnda Shields at [facility] in the past. Most recent rx is oxycodone-acetaminophen 10-316m in April 2022 for 14 day supply.  Does have a hx significant for chronic pain and substance use disorder.  Unfortunately has been turned down by a number of pain management clinics in the past, interested in trying again.  We have placed a few recent referrals for her as of early April - neurosurgery for second opinion to Dr. G[facility]ary Kos [facility] Unfortunately these appts have not happened - though they appear correctly processed in MyChart.  Another referral recently was to Urology Dr. MMatilde Sprang Pt has seen him in the past and would like to continue. Given Pt the info for this office and encouraged her to contact their office for appt.  Past Medical History:  Diagnosis Date   Allergy    Alopecia    Anxiety    Arthritis    Asthma    Asthma    Phreesia 10/22/2020   Cataract    "mild"   COPD (chronic obstructive pulmonary disease) ([place]    Depression    Depression    Phreesia 10/22/2020   GERD (gastroesophageal reflux disease)    o cc- takes OTC if needed.   Glaucoma    Hepatitis C    Hypertension    onset age 60   Iron deficiency anemia    Migraine    Schizoaffective disorder (HCC)    Sciatic pain    Seizures (HCC)    onset in childhood. 06-03-16- per pt 8 months agoGeneralized tonic-clonic.Last seizure couple of months  ago- last sz 9-10 months ago per pt 10-12-2016   Shortness of breath dyspnea    Substance abuse ([place]    Ulcer     Past Surgical History:  Procedure Laterality Date   ABDOMINAL HYSTERECTOMY     ovarian cyst B, cervical dysplasia, fibroids.   Ovaries intact.   BIOPSY  07/11/2018   Procedure: BIOPSY;  Surgeon: AYetta Flock MD;  Location: [facility];  Service: Gastroenterology;;   COLONOSCOPY     COLONOSCOPY W/ BIOPSIES     DILATION AND CURETTAGE OF UTERUS     x2   ESOPHAGOGASTRODUODENOSCOPY (EGD) WITH PROPOFOL N/A 07/11/2018   Procedure: ESOPHAGOGASTRODUODENOSCOPY (EGD) WITH PROPOFOL;  Surgeon: AYetta Flock MD;  Location: [facility];  Service: Gastroenterology;  Laterality: N/A;   EXPLORATORY LAPAROTOMY     x3   GYNECOLOGIC CRYOSURGERY     x3   HOT HEMOSTASIS N/A 07/11/2018   Procedure: HOT HEMOSTASIS (ARGON PLASMA COAGULATION/BICAP);  Surgeon: AYetta Flock MD;  [facility];  Service: Gastroenterology;  Laterality: N/A;   MANDIBLE RECONSTRUCTION N/A 06/27/2015   Procedure: REMOVAL MAXILLARY PALATAL TORUS.  REMOVAL MANDIBULAR TORUS AND EXOSTOSIS.  ;  Surgeon: SDiona Browner DDS;  [facility]  Service: Oral Surgery;  Laterality: N/A;   OVARIAN CYST  REMOVAL  2008   POLYPECTOMY      Family History  Problem Relation Age of Onset   Diabetes type II Mother    Hypertension Mother    Arthritis Mother    Diabetes Mother    Mental illness Mother        bipolar   Diabetes type II Maternal Aunt    Hypertension Father    Heart murmur Father    Arthritis Father    Stroke Father    Alopecia Sister    Alopecia Brother    Alopecia Sister    Mental retardation Sister        depression   Prostate cancer Paternal Grandfather    Cancer Maternal Uncle    Colon cancer Neg Hx    Esophageal cancer Neg Hx    Rectal cancer Neg Hx    Stomach cancer Neg Hx    Colon polyps Neg Hx     Social History   Socioeconomic History   Marital status: Widowed     Spouse name: Not on file   Number of children: 1   Years of education: Not on file   Highest education level: Not on file  Occupational History   Occupation: disabled    Comment: mental illness; seizures  Tobacco Use   Smoking status: Every Day    Packs/day: 0.33    Years: 44.00    Pack years: 14.52    Types: Cigarettes   Smokeless tobacco: Never  Vaping Use   Vaping Use: Never used  Substance and Sexual Activity   Alcohol use: Yes    Alcohol/week: 0.0 standard drinks    Comment: 1 pakj every 3 days   Drug use: Not Currently    Types: Marijuana, Cocaine    Comment: not used in 5 months    Sexual activity: Not Currently    Birth control/protection: None    Comment: widow  Other Topics Concern   Not on file  Social History Narrative   Marital status:  Widowed since 2002.  Married x 16 years. + dating.  Moved from [place] to live with daughter in 2013.     Children:  One child/daughter (33); two grandchildren.      Lives: with daughter, grandchildren 2.  Does not drive due to epilepsy.      Employment:  Disability for schizoaffective disorder.      Tobacco: 1 ppd x since 8th grade.      Alcohol:  Social; rare drinking due to seizure medications.  Weekends.       Drugs: none; previous use of marijuana.  Previous iv drug use, cocaine.      Exercise: none      Seatbelt:  100%      Guns: none   Social Determinants of Radio broadcast assistant Strain: Not on file  Food Insecurity: Not on file  Transportation Needs: Not on file  Physical Activity: Not on file  Stress: Not on file  Social Connections: Not on file  Intimate Partner Violence: Not on file    Outpatient Medications Prior to Visit  Medication Sig Dispense Refill   acetaminophen (TYLENOL) 500 MG tablet Take 2,000-2,500 mg by mouth every 4 (four) hours as needed (pain).     albuterol (VENTOLIN HFA) 108 (90 Base) MCG/ACT inhaler INHALE 2 PUFFS BY MOUTH EVERY 6 HOURS AS NEEDED FOR SHORTNESS OF BREATH 8.5  each 3   Blood Pressure Monitor KIT Dispense one blood pressure monitor to blood pressure 1 kit  0   budesonide-formoterol (SYMBICORT) 80-4.5 MCG/ACT inhaler Inhale 2 puffs into the lungs 2 (two) times daily.     citalopram (CELEXA) 40 MG tablet Take 0.5 tablets (20 mg total) by mouth daily. 90 tablet 3   divalproex (DEPAKOTE) 500 MG DR tablet Take 1 tablet (500 mg total) by mouth 3 (three) times daily. 270 tablet 3   gabapentin (NEURONTIN) 400 MG capsule Take 1 capsule (400 mg total) by mouth 3 (three) times daily. 270 capsule 1   hydrOXYzine (ATARAX/VISTARIL) 10 MG tablet Take 1 tablet (10 mg total) by mouth 3 (three) times daily as needed. 30 tablet 5   ipratropium-albuterol (DUONEB) 0.5-2.5 (3) MG/3ML SOLN Take 3 mLs by nebulization every 6 (six) hours as needed (shortness of breath). Reported on 12/02/2015 360 mL 1   methocarbamol (ROBAXIN) 500 MG tablet TAKE 1 TABLET (500 MG TOTAL) BY MOUTH 4 (FOUR) TIMES DAILY. 60 tablet 0   mirabegron ER (MYRBETRIQ) 50 MG TB24 tablet Take 1 tablet (50 mg total) by mouth daily. 90 tablet 1   montelukast (SINGULAIR) 10 MG tablet TAKE 1 TABLET BY MOUTH EVERYDAY AT BEDTIME 90 tablet 1   Multiple Vitamin (MULTIVITAMIN WITH MINERALS) TABS tablet Take 1 tablet by mouth daily. 30 tablet 1   ondansetron (ZOFRAN) 4 MG tablet Take 1 tablet (4 mg total) by mouth every 6 (six) hours. 12 tablet 0   oxyCODONE-acetaminophen (PERCOCET) 10-325 MG tablet Take 1 tablet by mouth 5 (five) times daily.     pantoprazole (PROTONIX) 20 MG tablet TAKE 1 TABLET BY MOUTH EVERY DAY 90 tablet 0   QUEtiapine (SEROQUEL XR) 50 MG TB24 24 hr tablet Take 1 tablet (50 mg total) by mouth at bedtime. 30 tablet 3   SYMBICORT 160-4.5 MCG/ACT inhaler TAKE 2 PUFFS BY MOUTH TWICE A DAY 10.2 each 2   thiamine 100 MG tablet Take 1 tablet (100 mg total) by mouth daily. 90 tablet 1   tiZANidine (ZANAFLEX) 4 MG tablet TAKE 1 TABLET (4 MG TOTAL) BY MOUTH 3 (THREE) TIMES DAILY AS NEEDED FOR MUSCLE SPASMS. 90  tablet 3   triamcinolone cream (KENALOG) 0.1 % APPLY CREAM EXTERNALLY TWICE DAILY (Patient taking differently: Apply 1 application topically 2 (two) times daily as needed (rash/irritation).) 30 g 0   valACYclovir (VALTREX) 1000 MG tablet TAKE 2 TABLETS BY MOUTH AT SYMPTOM ONSET, REPEAT IN 12 HOURS 20 tablet 2   vitamin C (ASCORBIC ACID) 250 MG tablet Take 250 mg by mouth daily.     No facility-administered medications prior to visit.    Allergies  Allergen Reactions   Fruit & Vegetable Daily [Nutritional Supplements] Shortness Of Breath    Reaction to Aloe   Iodinated Diagnostic Agents Swelling    Swelling and itching of left side of her face only after CT SI injection(no steroid used)   Ibuprofen Other (See Comments)    Causes Stomach upset; However, pt can take Meloxicam without incident and takes this at home.    Aloe Vera Rash   Aspirin Rash and Other (See Comments)    Stomach upset    Review of Systems  Constitutional: Negative.   HENT: Negative.    Eyes: Negative.   Respiratory: Negative.    Cardiovascular: Negative.   Gastrointestinal: Negative.   Genitourinary: Negative.   Musculoskeletal: Negative.   Skin: Negative.   Neurological: Negative.   Psychiatric/Behavioral: Negative.    All other systems reviewed and are negative.     Objective:    Physical Exam Vitals and  nursing note reviewed.  Constitutional:      General: She is not in acute distress.    Appearance: Normal appearance. She is not ill-appearing, toxic-appearing or diaphoretic.  Cardiovascular:     Rate and Rhythm: Normal rate and regular rhythm.     Pulses: Normal pulses.     Heart sounds: Normal heart sounds. No murmur heard.   No friction rub. No gallop.  Pulmonary:     Effort: Pulmonary effort is normal. No respiratory distress.     Breath sounds: Normal breath sounds. No stridor. No wheezing, rhonchi or rales.  Chest:     Chest wall: No tenderness.  Skin:    General: Skin is warm and dry.      Capillary Refill: Capillary refill takes less than 2 seconds.  Neurological:     General: No focal deficit present.     Mental Status: She is alert and oriented to person, place, and time. Mental status is at baseline.  Psychiatric:        Mood and Affect: Mood normal.        Behavior: Behavior normal.        Thought Content: Thought content normal.        Judgment: Judgment normal.    BP (!) 162/96   Pulse 84   Temp 98.2 F (36.8 C) (Temporal)   Resp 18   Ht 5' 3"  (1.6 m)   Wt 106 lb 6.4 oz (48.3 kg)   SpO2 98%   BMI 18.85 kg/m  Wt Readings from Last 3 Encounters:  06/03/21 106 lb 6.4 oz (48.3 kg)  04/02/21 106 lb 6.4 oz (48.3 kg)  03/23/21 104 lb (47.2 kg)    There are no preventive care reminders to display for this patient.  There are no preventive care reminders to display for this patient.   Lab Results  Component Value Date   TSH 0.28 (L) 04/02/2021   Lab Results  Component Value Date   WBC 4.5 04/02/2021   HGB 14.0 04/02/2021   HCT 40.5 04/02/2021   MCV 105.5 (H) 04/02/2021   PLT 207.0 04/02/2021   Lab Results  Component Value Date   NA 138 04/02/2021   K 3.6 04/02/2021   CO2 30 04/02/2021   GLUCOSE 81 04/02/2021   BUN 4 (L) 04/02/2021   CREATININE 0.87 04/02/2021   BILITOT 0.5 04/02/2021   ALKPHOS 85 04/02/2021   AST 81 (H) 04/02/2021   ALT 48 (H) 04/02/2021   PROT 8.2 04/02/2021   ALBUMIN 4.5 04/02/2021   CALCIUM 10.3 04/02/2021   ANIONGAP 10 10/22/2020   GFR 71.56 04/02/2021   Lab Results  Component Value Date   CHOL 184 04/02/2021   Lab Results  Component Value Date   HDL 77.70 04/02/2021   Lab Results  Component Value Date   LDLCALC 91 04/02/2021   Lab Results  Component Value Date   TRIG 75.0 04/02/2021   Lab Results  Component Value Date   CHOLHDL 2 04/02/2021   Lab Results  Component Value Date   HGBA1C 5.2 04/02/2021       Assessment & Plan:   Problem List Items Addressed This Visit       Other    Dysuria - Primary   Relevant Orders   POCT URINALYSIS DIP (CLINITEK) (Completed)   Urine cytology ancillary only(Altha)   Urine Culture   Other Visit Diagnoses     Acute cystitis with hematuria       Relevant Medications  nitrofurantoin, macrocrystal-monohydrate, (MACROBID) 100 MG capsule        No orders of the defined types were placed in this encounter.  PLAN Poct UA suggestive of UTI. Will treat with macrobid 119m PO bid for five days.  Sending urine culture and STD screen  Patient encouraged to call clinic with any questions, comments, or concerns.   RMaximiano Coss NP

## 2021-06-04 LAB — URINE CULTURE
MICRO NUMBER:: 12038448
Result:: NO GROWTH
SPECIMEN QUALITY:: ADEQUATE

## 2021-06-04 LAB — URINE CYTOLOGY ANCILLARY ONLY
Chlamydia: NEGATIVE
Comment: NEGATIVE
Comment: NEGATIVE
Comment: NORMAL
Neisseria Gonorrhea: NEGATIVE
Trichomonas: NEGATIVE

## 2021-06-16 ENCOUNTER — Encounter (HOSPITAL_BASED_OUTPATIENT_CLINIC_OR_DEPARTMENT_OTHER): Payer: Self-pay

## 2021-06-19 ENCOUNTER — Ambulatory Visit: Payer: 59 | Admitting: Orthopaedic Surgery

## 2021-07-09 ENCOUNTER — Ambulatory Visit (INDEPENDENT_AMBULATORY_CARE_PROVIDER_SITE_OTHER): Payer: 59 | Admitting: Registered Nurse

## 2021-07-09 ENCOUNTER — Other Ambulatory Visit: Payer: Self-pay

## 2021-07-09 ENCOUNTER — Encounter: Payer: Self-pay | Admitting: Registered Nurse

## 2021-07-09 ENCOUNTER — Other Ambulatory Visit: Payer: Self-pay | Admitting: Registered Nurse

## 2021-07-09 VITALS — Temp 98.3°F | Resp 18 | Ht 63.0 in | Wt 112.8 lb

## 2021-07-09 DIAGNOSIS — R059 Cough, unspecified: Secondary | ICD-10-CM

## 2021-07-09 DIAGNOSIS — R0600 Dyspnea, unspecified: Secondary | ICD-10-CM

## 2021-07-09 DIAGNOSIS — R350 Frequency of micturition: Secondary | ICD-10-CM

## 2021-07-09 DIAGNOSIS — R21 Rash and other nonspecific skin eruption: Secondary | ICD-10-CM

## 2021-07-09 DIAGNOSIS — M791 Myalgia, unspecified site: Secondary | ICD-10-CM

## 2021-07-09 DIAGNOSIS — J431 Panlobular emphysema: Secondary | ICD-10-CM

## 2021-07-09 DIAGNOSIS — M62838 Other muscle spasm: Secondary | ICD-10-CM

## 2021-07-09 DIAGNOSIS — F419 Anxiety disorder, unspecified: Secondary | ICD-10-CM

## 2021-07-09 DIAGNOSIS — R0609 Other forms of dyspnea: Secondary | ICD-10-CM

## 2021-07-09 DIAGNOSIS — B370 Candidal stomatitis: Secondary | ICD-10-CM

## 2021-07-09 DIAGNOSIS — R413 Other amnesia: Secondary | ICD-10-CM

## 2021-07-09 DIAGNOSIS — R531 Weakness: Secondary | ICD-10-CM

## 2021-07-09 DIAGNOSIS — F250 Schizoaffective disorder, bipolar type: Secondary | ICD-10-CM

## 2021-07-09 DIAGNOSIS — Z79899 Other long term (current) drug therapy: Secondary | ICD-10-CM

## 2021-07-09 DIAGNOSIS — M2550 Pain in unspecified joint: Secondary | ICD-10-CM

## 2021-07-09 DIAGNOSIS — B001 Herpesviral vesicular dermatitis: Secondary | ICD-10-CM

## 2021-07-09 DIAGNOSIS — R519 Headache, unspecified: Secondary | ICD-10-CM

## 2021-07-09 MED ORDER — DIVALPROEX SODIUM 500 MG PO DR TAB: 500.0000 mg | DELAYED_RELEASE_TABLET | Freq: Three times a day (TID) | ORAL | 3 refills | Status: DC

## 2021-07-09 MED ORDER — ALBUTEROL SULFATE HFA 108 (90 BASE) MCG/ACT IN AERS: 1.0000 | INHALATION_SPRAY | RESPIRATORY_TRACT | 3 refills | Status: DC | PRN

## 2021-07-09 MED ORDER — TRAMADOL HCL 50 MG PO TABS: 50.0000 mg | ORAL_TABLET | Freq: Three times a day (TID) | ORAL | 3 refills | Status: AC | PRN

## 2021-07-09 MED ORDER — HYDROXYZINE HCL 10 MG PO TABS: 10.0000 mg | ORAL_TABLET | Freq: Three times a day (TID) | ORAL | 5 refills | Status: DC | PRN

## 2021-07-09 MED ORDER — VALACYCLOVIR HCL 1 G PO TABS: ORAL_TABLET | ORAL | 2 refills | Status: DC

## 2021-07-09 MED ORDER — TRIAMCINOLONE ACETONIDE 0.1 % EX CREA: 1.0000 "application " | TOPICAL_CREAM | Freq: Two times a day (BID) | CUTANEOUS | 3 refills | Status: AC | PRN

## 2021-07-09 MED ORDER — QUETIAPINE FUMARATE ER 50 MG PO TB24: 50.0000 mg | ORAL_TABLET | Freq: Every day | ORAL | 3 refills | Status: DC

## 2021-07-09 MED ORDER — METHOCARBAMOL 500 MG PO TABS: ORAL_TABLET | ORAL | 0 refills | Status: DC

## 2021-07-09 MED ORDER — BUDESONIDE-FORMOTEROL FUMARATE 160-4.5 MCG/ACT IN AERO: INHALATION_SPRAY | RESPIRATORY_TRACT | 2 refills | Status: DC

## 2021-07-09 MED ORDER — ALBUTEROL SULFATE HFA 108 (90 BASE) MCG/ACT IN AERS: 1.0000 | INHALATION_SPRAY | Freq: Four times a day (QID) | RESPIRATORY_TRACT | 11 refills | Status: DC | PRN

## 2021-07-09 MED ORDER — GABAPENTIN 400 MG PO CAPS: 400.0000 mg | ORAL_CAPSULE | Freq: Three times a day (TID) | ORAL | 1 refills | Status: DC

## 2021-07-09 MED ORDER — MIRABEGRON ER 50 MG PO TB24: 50.0000 mg | ORAL_TABLET | Freq: Every day | ORAL | 1 refills | Status: DC

## 2021-07-09 MED ORDER — NYSTATIN 100000 UNIT/ML MT SUSP: 5.0000 mL | Freq: Four times a day (QID) | OROMUCOSAL | 0 refills | Status: AC

## 2021-07-09 NOTE — Addendum Note (Signed)
Addended by: Maximiano Coss on: 07/09/2021 03:38 PM   Modules accepted: Orders

## 2021-07-09 NOTE — Patient Instructions (Addendum)
Ms. Danehy -   Doristine Devoid to see you  Let's check labs  Imaging of chest and head ordered  Med refills sent - see attached for list  I'll call with concerning results  Danae Chen and I will keep working on [place]  Thank you  Rich     If you have lab work done today you will be contacted with your lab results within the next 2 weeks.  If you have not heard from [place] then please contact us. The fastest way to get your results is to register for My Chart.   IF you received an x-ray today, you will receive an invoice from [facility]. Please contact [facility] at [phone] with questions or concerns regarding your invoice.   IF you received labwork today, you will receive an invoice from [facility]. Please contact [facility] at [phone] with questions or concerns regarding your invoice.   Our billing staff will not be able to assist you with questions regarding bills from these companies.  You will be contacted with the lab results as soon as they are available. The fastest way to get your results is to activate your My Chart account. Instructions are located on the last page of this paperwork. If you have not heard from [place] regarding the results in 2 weeks, please contact this office.

## 2021-07-09 NOTE — Progress Notes (Signed)
Established Patient Office Visit  Subjective:  Patient ID: Melissa Bridges, female    DOB: [birth year]  Age: 60 y.o. MRN: [id number]  CC:  Chief Complaint  Patient presents with   Fatigue    Patient states she is experiencing some breathing issues, muscle pain, body aching , fatigue and dizziness. 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    HPI Melissa Bridges presents for fatigue and weakness  Stable from previous visits Wheezing and shob ongoing despite regular use of inhalers Notes she has moved to a new location without mold. No sputum production but does have frequent cough Known hx of COPD - endorses 22 pack year hx of smoking No recent chest imaging.  Notes new near daily headache Top of head Hx of migraines but always on sides of head This has come along with some short term memory changes and some L side weakness.  Slow onset, nothing acute. L side weakness she feels is related to c spine rather than brain  Otherwise notes she needs most of her medications refilled.   Past Medical History:  Diagnosis Date   Allergy    Alopecia    Anxiety    Arthritis    Asthma    Asthma    [employer]rees[employer] 10/22/2020   Cataract    "mild"   COPD (chronic obstructive pulmonary disease) ([place])    Depression    Depression    [employer]rees[employer] 10/22/2020   GERD (gastroesophageal reflux disease)    o cc- takes OTC if needed.   Glaucoma    Hepatitis C    Hypertension    onset age 26.   Iron deficiency anemia    Migraine    Schizoaffective disorder (HCC)    Sciatic pain    Seizures (HCC)    onset in childhood. 06-03-16- per pt 8 months agoGeneralized tonic-clonic.Last seizure couple of months ago- last sz 9-10 months ago per pt 10-12-2016   Shortness of breath dyspnea    Substance abuse ([place])    Ulcer     Past Surgical History:  Procedure Laterality Date   ABDOMINAL HYSTERECTOMY     ovarian cyst B, cervical dysplasia, fibroids.   Ovaries intact.   BIOPSY  07/11/2018   Procedure:  BIOPSY;  Surgeon: Melissa Flock, MD;  Location: [facility];  Service: Gastroenterology;;   COLONOSCOPY     COLONOSCOPY W/ BIOPSIES     DILATION AND CURETTAGE OF UTERUS     x2   ESOPHAGOGASTRODUODENOSCOPY (EGD) WITH PROPOFOL N/A 07/11/2018   Procedure: ESOPHAGOGASTRODUODENOSCOPY (EGD) WITH PROPOFOL;  Surgeon: Melissa Flock, MD;  Location: [facility];  Service: Gastroenterology;  Laterality: N/A;   EXPLORATORY LAPAROTOMY     x3   GYNECOLOGIC CRYOSURGERY     x3   HOT HEMOSTASIS N/A 07/11/2018   Procedure: HOT HEMOSTASIS (ARGON PLASMA COAGULATION/BICAP);  Surgeon: Melissa Flock, MD;  Location: [facility];  Service: Gastroenterology;  Laterality: N/A;   MANDIBLE RECONSTRUCTION N/A 06/27/2015   Procedure: REMOVAL MAXILLARY PALATAL TORUS.  REMOVAL MANDIBULAR TORUS AND EXOSTOSIS.  ;  Surgeon: Melissa Bridges, DDS;  Location: [facility];  Service: Oral Surgery;  Laterality: N/A;   OVARIAN CYST REMOVAL  2008   POLYPECTOMY      Family History  Problem Relation Age of Onset   Diabetes type II Mother    Hypertension Mother    Arthritis Mother    Diabetes Mother    Mental illness Mother        bipolar   Diabetes  type II Maternal Aunt    Hypertension Father    Heart murmur Father    Arthritis Father    Stroke Father    Alopecia Sister    Alopecia Brother    Alopecia Sister    Mental retardation Sister        depression   Prostate cancer Paternal Grandfather    Cancer Maternal Uncle    Colon cancer Neg Hx    Esophageal cancer Neg Hx    Rectal cancer Neg Hx    Stomach cancer Neg Hx    Colon polyps Neg Hx     Social History   Socioeconomic History   Marital status: Widowed    Spouse name: Not on file   Number of children: 1   Years of education: Not on file   Highest education level: Not on file  Occupational History   Occupation: disabled    Comment: mental illness; seizures  Tobacco Use   Smoking status: Every Day    Packs/day: 0.50    Years: 44.00    Pack  years: 22.00    Types: Cigarettes   Smokeless tobacco: Never  Vaping Use   Vaping Use: Never used  Substance and Sexual Activity   Alcohol use: Yes    Alcohol/week: 0.0 standard drinks    Comment: 1 pakj every 3 days   Drug use: Not Currently    Types: Marijuana, Cocaine    Comment: not used in 5 months    Sexual activity: Not Currently    Birth control/protection: None    Comment: widow  Other Topics Concern   Not on file  Social History Narrative   Marital status:  Widowed since 2002.  Married x 16 years. + dating.  Moved from [place] to live with daughter in 2013.     Children:  One child/daughter (33); two grandchildren.      Lives: with daughter, grandchildren 2.  Does not drive due to epilepsy.      Employment:  Disability for schizoaffective disorder.      Tobacco: 1 ppd x since 8th grade.      Alcohol:  Social; rare drinking due to seizure medications.  Weekends.       Drugs: none; previous use of marijuana.  Previous iv drug use, cocaine.      Exercise: none      Seatbelt:  100%      Guns: none   Social Determinants of Radio broadcast assistant Strain: Not on file  Food Insecurity: Not on file  Transportation Needs: Not on file  Physical Activity: Not on file  Stress: Not on file  Social Connections: Not on file  Intimate Partner Violence: Not on file    Outpatient Medications Prior to Visit  Medication Sig Dispense Refill   acetaminophen (TYLENOL) 500 MG tablet Take 2,000-2,500 mg by mouth every 4 (four) hours as needed (pain).     Blood Pressure Monitor KIT Dispense one blood pressure monitor to blood pressure 1 kit 0   citalopram (CELEXA) 40 MG tablet Take 0.5 tablets (20 mg total) by mouth daily. 90 tablet 3   ipratropium-albuterol (DUONEB) 0.5-2.5 (3) MG/3ML SOLN Take 3 mLs by nebulization every 6 (six) hours as needed (shortness of breath). Reported on 12/02/2015 360 mL 1   montelukast (SINGULAIR) 10 MG tablet TAKE 1 TABLET BY MOUTH EVERYDAY AT  BEDTIME 90 tablet 1   Multiple Vitamin (MULTIVITAMIN WITH MINERALS) TABS tablet Take 1 tablet by mouth daily. 30 tablet 1  ondansetron (ZOFRAN) 4 MG tablet Take 1 tablet (4 mg total) by mouth every 6 (six) hours. 12 tablet 0   oxyCODONE-acetaminophen (PERCOCET) 10-325 MG tablet Take 1 tablet by mouth 5 (five) times daily.     pantoprazole (PROTONIX) 20 MG tablet TAKE 1 TABLET BY MOUTH EVERY DAY 90 tablet 0   thiamine 100 MG tablet Take 1 tablet (100 mg total) by mouth daily. 90 tablet 1   tiZANidine (ZANAFLEX) 4 MG tablet TAKE 1 TABLET (4 MG TOTAL) BY MOUTH 3 (THREE) TIMES DAILY AS NEEDED FOR MUSCLE SPASMS. 90 tablet 3   vitamin C (ASCORBIC ACID) 250 MG tablet Take 250 mg by mouth daily.     albuterol (VENTOLIN HFA) 108 (90 Base) MCG/ACT inhaler INHALE 2 PUFFS BY MOUTH EVERY 6 HOURS AS NEEDED FOR SHORTNESS OF BREATH 8.5 each 3   budesonide-formoterol (SYMBICORT) 80-4.5 MCG/ACT inhaler Inhale 2 puffs into the lungs 2 (two) times daily.     divalproex (DEPAKOTE) 500 MG DR tablet Take 1 tablet (500 mg total) by mouth 3 (three) times daily. 270 tablet 3   gabapentin (NEURONTIN) 400 MG capsule Take 1 capsule (400 mg total) by mouth 3 (three) times daily. 270 capsule 1   hydrOXYzine (ATARAX/VISTARIL) 10 MG tablet Take 1 tablet (10 mg total) by mouth 3 (three) times daily as needed. 30 tablet 5   methocarbamol (ROBAXIN) 500 MG tablet TAKE 1 TABLET (500 MG TOTAL) BY MOUTH 4 (FOUR) TIMES DAILY. 60 tablet 0   mirabegron ER (MYRBETRIQ) 50 MG TB24 tablet Take 1 tablet (50 mg total) by mouth daily. 90 tablet 1   nystatin (MYCOSTATIN) 100000 UNIT/ML suspension Take 5 mLs (500,000 Units total) by mouth 4 (four) times daily. 60 mL 0   QUEtiapine (SEROQUEL XR) 50 MG TB24 24 hr tablet Take 1 tablet (50 mg total) by mouth at bedtime. 30 tablet 3   SYMBICORT 160-4.5 MCG/ACT inhaler TAKE 2 PUFFS BY MOUTH TWICE A DAY 10.2 each 2   triamcinolone cream (KENALOG) 0.1 % APPLY CREAM EXTERNALLY TWICE DAILY (Patient taking  differently: Apply 1 application topically 2 (two) times daily as needed (rash/irritation).) 30 g 0   valACYclovir (VALTREX) 1000 MG tablet TAKE 2 TABLETS BY MOUTH AT SYMPTOM ONSET, REPEAT IN 12 HOURS 20 tablet 2   No facility-administered medications prior to visit.    Allergies  Allergen Reactions   Fruit & Vegetable Daily [Nutritional Supplements] Shortness Of Breath    Reaction to Aloe   Iodinated Diagnostic Agents Swelling    Swelling and itching of left side of her face only after CT SI injection(no steroid used)   Ibuprofen Other (See Comments)    Causes Stomach upset; However, pt can take Meloxicam without incident and takes this at home.    Aloe Vera Rash   Aspirin Rash and Other (See Comments)    Stomach upset    ROS Review of Systems  Constitutional:  Positive for fatigue. Negative for activity change, appetite change, chills, diaphoresis, fever and unexpected weight change.  HENT: Negative.    Eyes: Negative.   Respiratory:  Positive for cough, chest tightness, shortness of breath and wheezing. Negative for apnea, choking and stridor.   Cardiovascular: Negative.  Negative for chest pain, palpitations and leg swelling.  Gastrointestinal: Negative.   Genitourinary: Negative.   Musculoskeletal: Negative.   Skin: Negative.   Neurological: Negative.   Psychiatric/Behavioral: Negative.    All other systems reviewed and are negative.    Objective:    Physical Exam Vitals  and nursing note reviewed.  Constitutional:      General: She is not in acute distress.    Appearance: Normal appearance. She is normal weight. She is not ill-appearing, toxic-appearing or diaphoretic.  Cardiovascular:     Rate and Rhythm: Normal rate and regular rhythm.     Heart sounds: Normal heart sounds. No murmur heard.   No friction rub. No gallop.  Pulmonary:     Effort: Pulmonary effort is normal. No respiratory distress.     Breath sounds: No stridor. Wheezing and rhonchi present. No  rales.  Chest:     Chest wall: No tenderness.  Skin:    General: Skin is warm and dry.  Neurological:     General: No focal deficit present.     Mental Status: She is alert and oriented to person, place, and time. Mental status is at baseline.  Psychiatric:        Mood and Affect: Mood normal.        Behavior: Behavior normal.        Thought Content: Thought content normal.        Judgment: Judgment normal.    Temp 98.3 F (36.8 C) (Temporal)   Resp 18   Ht _0  (1.6 m)   Wt 112 lb 12.8 oz (51.2 kg)   SpO2 100%   BMI 19.98 kg/m  Wt Readings from Last 3 Encounters:  07/09/21 112 lb 12.8 oz (51.2 kg)  06/03/21 106 lb 6.4 oz (48.3 kg)  04/02/21 106 lb 6.4 oz (48.3 kg)     There are no preventive care reminders to display for this patient.  There are no preventive care reminders to display for this patient.  Lab Results  Component Value Date   TSH 0.28 (L) 04/02/2021   Lab Results  Component Value Date   WBC 4.5 04/02/2021   HGB 14.0 04/02/2021   HCT 40.5 04/02/2021   MCV 105.5 (H) 04/02/2021   PLT 207.0 04/02/2021   Lab Results  Component Value Date   NA 138 04/02/2021   K 3.6 04/02/2021   CO2 30 04/02/2021   GLUCOSE 81 04/02/2021   BUN 4 (L) 04/02/2021   CREATININE 0.87 04/02/2021   BILITOT 0.5 04/02/2021   ALKPHOS 85 04/02/2021   AST 81 (H) 04/02/2021   ALT 48 (H) 04/02/2021   PROT 8.2 04/02/2021   ALBUMIN 4.5 04/02/2021   CALCIUM 10.3 04/02/2021   ANIONGAP 10 10/22/2020   GFR 71.56 04/02/2021   Lab Results  Component Value Date   CHOL 184 04/02/2021   Lab Results  Component Value Date   HDL 77.70 04/02/2021   Lab Results  Component Value Date   LDLCALC 91 04/02/2021   Lab Results  Component Value Date   TRIG 75.0 04/02/2021   Lab Results  Component Value Date   CHOLHDL 2 04/02/2021   Lab Results  Component Value Date   HGBA1C 5.2 04/02/2021      Assessment & Plan:   Problem List Items Addressed This Visit        Respiratory   COPD (chronic obstructive pulmonary disease) (HCC)   Relevant Medications   albuterol (VENTOLIN HFA) 108 (90 Base) MCG/ACT inhaler   budesonide-formoterol (SYMBICORT) 160-4.5 MCG/ACT inhaler     Other   Schizoaffective disorder, bipolar type (HCC)   Relevant Medications   QUEtiapine (SEROQUEL XR) 50 MG TB24 24 hr tablet   Other Visit Diagnoses     Myalgia    -  Primary  Relevant Medications   traMADol (ULTRAM) 50 MG tablet   Dyspnea on exertion       Relevant Orders   Ambulatory referral to Cardiology   Weakness       Relevant Orders   Ambulatory referral to Pulmonology   Cough       Relevant Orders   CT Chest Wo Contrast   New onset headache       Relevant Medications   divalproex (DEPAKOTE) 500 MG DR tablet   gabapentin (NEURONTIN) 400 MG capsule   methocarbamol (ROBAXIN) 500 MG tablet   traMADol (ULTRAM) 50 MG tablet   Other Relevant Orders   CT Head Wo Contrast   Short-term memory loss       Relevant Orders   CT Head Wo Contrast   On valproic acid therapy       Relevant Medications   divalproex (DEPAKOTE) 500 MG DR tablet   Polyarthralgia       Relevant Medications   gabapentin (NEURONTIN) 400 MG capsule   Anxiety       Relevant Medications   hydrOXYzine (ATARAX/VISTARIL) 10 MG tablet   Frequency of micturition       Relevant Medications   mirabegron ER (MYRBETRIQ) 50 MG TB24 tablet   Oral thrush       Relevant Medications   nystatin (MYCOSTATIN) 100000 UNIT/ML suspension   valACYclovir (VALTREX) 1000 MG tablet   Muscle spasm       Relevant Medications   methocarbamol (ROBAXIN) 500 MG tablet   Rash and nonspecific skin eruption       Relevant Medications   triamcinolone cream (KENALOG) 0.1 %   Herpes labialis       Relevant Medications   nystatin (MYCOSTATIN) 100000 UNIT/ML suspension   valACYclovir (VALTREX) 1000 MG tablet       Meds ordered this encounter  Medications   albuterol (VENTOLIN HFA) 108 (90 Base) MCG/ACT inhaler     Sig: Inhale 1-2 puffs into the lungs every 4 (four) hours as needed for wheezing or shortness of breath.    Dispense:  8.5 each    Refill:  3    Order Specific Question:   Supervising Provider    Answer:   Carlota Raspberry, JEFFREY R [[id number]]   budesonide-formoterol (SYMBICORT) 160-4.5 MCG/ACT inhaler    Sig: TAKE 2 PUFFS BY MOUTH TWICE A DAY    Dispense:  10.2 each    Refill:  2    DX Code Needed  .    Order Specific Question:   Supervising Provider    Answer:   Carlota Raspberry, JEFFREY R [[id number]]   divalproex (DEPAKOTE) 500 MG DR tablet    Sig: Take 1 tablet (500 mg total) by mouth 3 (three) times daily.    Dispense:  270 tablet    Refill:  3    Order Specific Question:   Supervising Provider    Answer:   Carlota Raspberry, JEFFREY R [[id number]]   gabapentin (NEURONTIN) 400 MG capsule    Sig: Take 1 capsule (400 mg total) by mouth 3 (three) times daily.    Dispense:  270 capsule    Refill:  1    Order Specific Question:   Supervising Provider    Answer:   Carlota Raspberry, JEFFREY R [[id number]]   hydrOXYzine (ATARAX/VISTARIL) 10 MG tablet    Sig: Take 1 tablet (10 mg total) by mouth 3 (three) times daily as needed.    Dispense:  30 tablet    Refill:  5    Order  Specific Question:   Supervising Provider    Answer:   Carlota Raspberry, JEFFREY R [[id number]]   mirabegron ER (MYRBETRIQ) 50 MG TB24 tablet    Sig: Take 1 tablet (50 mg total) by mouth daily.    Dispense:  90 tablet    Refill:  1    Order Specific Question:   Supervising Provider    Answer:   Carlota Raspberry, JEFFREY R [[id number]]   nystatin (MYCOSTATIN) 100000 UNIT/ML suspension    Sig: Take 5 mLs (500,000 Units total) by mouth 4 (four) times daily.    Dispense:  60 mL    Refill:  0    Order Specific Question:   Supervising Provider    Answer:   Carlota Raspberry, JEFFREY R [[id number]]   methocarbamol (ROBAXIN) 500 MG tablet    Sig: TAKE 1 TABLET (500 MG TOTAL) BY MOUTH 4 (FOUR) TIMES DAILY.    Dispense:  60 tablet    Refill:  0    DX Code Needed  .    Order Specific Question:   Supervising Provider     Answer:   Carlota Raspberry, JEFFREY R [[id number]]   QUEtiapine (SEROQUEL XR) 50 MG TB24 24 hr tablet    Sig: Take 1 tablet (50 mg total) by mouth at bedtime.    Dispense:  30 tablet    Refill:  3    Order Specific Question:   Supervising Provider    Answer:   Carlota Raspberry, JEFFREY R [[id number]]   triamcinolone cream (KENALOG) 0.1 %    Sig: Apply 1 application topically 2 (two) times daily as needed (rash/irritation).    Dispense:  80 g    Refill:  3    Order Specific Question:   Supervising Provider    Answer:   Carlota Raspberry, JEFFREY R [[id number]]   valACYclovir (VALTREX) 1000 MG tablet    Sig: TAKE 2 TABLETS BY MOUTH AT SYMPTOM ONSET, REPEAT IN 12 HOURS    Dispense:  20 tablet    Refill:  2    Order Specific Question:   Supervising Provider    Answer:   Carlota Raspberry, JEFFREY R [[id number]]   traMADol (ULTRAM) 50 MG tablet    Sig: Take 1 tablet (50 mg total) by mouth every 8 (eight) hours as needed for up to 5 days.    Dispense:  15 tablet    Refill:  3    Order Specific Question:   Supervising Provider    Answer:   Carlota Raspberry, JEFFREY R [[id number]]    Follow-up: Return if symptoms worsen or fail to improve.   PLAN Unfortunately Ms. Loeb left before having labs collected. Will plan to collect at next visit. Will also put lab address on AVS and place future orders. Suspect COPD exacerbation. Will send prednisone and abx as above Refill meds as above Refer to pulmonology and cardiology. Concern for CAD given her hx. She will follow up with other specialists as scheduled We will continue to work on finding an adequate home health placement Patient encouraged to call clinic with any questions, comments, or concerns.  Maximiano Coss, NP

## 2021-07-27 ENCOUNTER — Telehealth: Payer: Self-pay | Admitting: Registered Nurse

## 2021-07-27 ENCOUNTER — Other Ambulatory Visit: Payer: Self-pay

## 2021-07-27 DIAGNOSIS — M519 Unspecified thoracic, thoracolumbar and lumbosacral intervertebral disc disorder: Secondary | ICD-10-CM

## 2021-07-27 NOTE — Telephone Encounter (Signed)
Pt called in asking if Delfino Lovett would be willing to do a referral to Dr. Driscilla Moats office?  Dr. Delice Lesch placed this referral for her back in April.   Please advise

## 2021-07-27 NOTE — Telephone Encounter (Signed)
Referral placed.

## 2021-07-27 NOTE — Telephone Encounter (Signed)
OK to order another referral for neurosurgery?

## 2021-07-27 NOTE — Telephone Encounter (Signed)
Absolutely -   I will cosign  Thanks  Rich

## 2021-07-28 ENCOUNTER — Other Ambulatory Visit: Payer: Self-pay

## 2021-07-28 ENCOUNTER — Ambulatory Visit (HOSPITAL_COMMUNITY)
Admission: RE | Admit: 2021-07-28 | Discharge: 2021-07-28 | Disposition: A | Discharge status: Home / Self Care | Payer: Medicare Other | Source: Ambulatory Visit | Attending: Registered Nurse | Admitting: Registered Nurse

## 2021-07-28 ENCOUNTER — Telehealth: Payer: Self-pay

## 2021-07-28 DIAGNOSIS — J439 Emphysema, unspecified: Secondary | ICD-10-CM | POA: Insufficient documentation

## 2021-07-28 DIAGNOSIS — R413 Other amnesia: Secondary | ICD-10-CM | POA: Insufficient documentation

## 2021-07-28 DIAGNOSIS — R059 Cough, unspecified: Secondary | ICD-10-CM | POA: Diagnosis present

## 2021-07-28 DIAGNOSIS — I700 Atherosclerosis of aorta: Secondary | ICD-10-CM | POA: Insufficient documentation

## 2021-07-28 DIAGNOSIS — R519 Headache, unspecified: Secondary | ICD-10-CM | POA: Diagnosis not present

## 2021-07-28 NOTE — Telephone Encounter (Signed)
Patient is calling in to check on status of Home Health referral.  Looks, like Melissa Bridges is waiting on provider response?  Please follow up in regard.

## 2021-07-28 NOTE — Telephone Encounter (Signed)
I have LM for [name] to call back with an update//ELEA

## 2021-07-29 ENCOUNTER — Encounter: Payer: Self-pay | Admitting: Registered Nurse

## 2021-07-29 DIAGNOSIS — I700 Atherosclerosis of aorta: Secondary | ICD-10-CM | POA: Insufficient documentation

## 2021-07-30 ENCOUNTER — Other Ambulatory Visit: Payer: Self-pay | Admitting: Registered Nurse

## 2021-07-30 DIAGNOSIS — M62838 Other muscle spasm: Secondary | ICD-10-CM

## 2021-07-30 NOTE — Telephone Encounter (Signed)
LFD 07/09/21 #60 with no refills LOV 07/09/21 NOV none

## 2021-08-04 ENCOUNTER — Ambulatory Visit: Payer: 59 | Admitting: Orthopaedic Surgery

## 2021-08-05 ENCOUNTER — Telehealth: Payer: Self-pay | Admitting: Registered Nurse

## 2021-08-05 NOTE — Telephone Encounter (Signed)
Per your last note, if symptoms don't improve or worsen, patient needs an appointment for reevaluation. Left a voice mail message for patient to call back and schedule an appointment for reevaluation. Patient states the tramadol is not working and wants something else called in.

## 2021-08-05 NOTE — Telephone Encounter (Signed)
Patient states that she is in pain and the tramadol is not working.  She would like something else called into [facility] on [address], [place], [place]  Please advise

## 2021-08-24 ENCOUNTER — Telehealth: Payer: Self-pay

## 2021-08-24 ENCOUNTER — Other Ambulatory Visit: Payer: Self-pay

## 2021-08-24 DIAGNOSIS — M62838 Other muscle spasm: Secondary | ICD-10-CM

## 2021-08-24 NOTE — Telephone Encounter (Signed)
Pt just had CT scan and hospital is telling her she needs a MRI done?   Pt call back [phone]

## 2021-08-24 NOTE — Telephone Encounter (Signed)
Patient is requesting a refill of the following medications: Requested Prescriptions   Pending Prescriptions Disp Refills   methocarbamol (ROBAXIN) 500 MG tablet 60 tablet 0    Sig: Take 1 tablet by mouth 4 times daily    Date of patient request: 08/22/2021 Last office visit: 07/09/2021 Date of last refill: 07/30/2021 Last refill amount: 60 tablets Follow up time period per chart: 08/27/2021

## 2021-08-25 NOTE — Telephone Encounter (Signed)
Patient called back and was unable to get in touch with Gabriel Cirri - told patient that I would get Gabriel Cirri to give her a call back as soon as possible.

## 2021-08-25 NOTE — Telephone Encounter (Signed)
Tried calling patient back about concerns. I informed patient on her vm that I will be out of the office until tomorrow and will reach back out to her when I come in.

## 2021-08-25 NOTE — Telephone Encounter (Signed)
Called and left a message on patient vm to return call to office about concerns of an MRI

## 2021-08-27 ENCOUNTER — Other Ambulatory Visit: Payer: Self-pay | Admitting: Registered Nurse

## 2021-08-27 ENCOUNTER — Ambulatory Visit: Payer: Medicare Other | Admitting: Registered Nurse

## 2021-08-27 DIAGNOSIS — M62838 Other muscle spasm: Secondary | ICD-10-CM

## 2021-08-28 ENCOUNTER — Other Ambulatory Visit: Payer: Self-pay | Admitting: Registered Nurse

## 2021-08-28 DIAGNOSIS — M62838 Other muscle spasm: Secondary | ICD-10-CM

## 2021-08-28 DIAGNOSIS — M519 Unspecified thoracic, thoracolumbar and lumbosacral intervertebral disc disorder: Secondary | ICD-10-CM

## 2021-08-28 NOTE — Telephone Encounter (Signed)
Have referred to pain clinic.   Thank you  Rich

## 2021-09-08 ENCOUNTER — Encounter: Payer: Self-pay | Admitting: Orthopaedic Surgery

## 2021-09-08 ENCOUNTER — Ambulatory Visit (INDEPENDENT_AMBULATORY_CARE_PROVIDER_SITE_OTHER): Payer: Medicare Other | Admitting: Orthopaedic Surgery

## 2021-09-08 DIAGNOSIS — M5412 Radiculopathy, cervical region: Secondary | ICD-10-CM

## 2021-09-08 DIAGNOSIS — M5416 Radiculopathy, lumbar region: Secondary | ICD-10-CM

## 2021-09-08 MED ORDER — PREDNISONE 5 MG (21) PO TBPK: ORAL_TABLET | ORAL | 0 refills | Status: DC

## 2021-09-08 NOTE — Progress Notes (Signed)
Office Visit Note   Patient: Melissa Bridges           Date of Birth: 1961-10-03           MRN: [id number] Visit Date: 09/08/2021              Requested by: Maximiano Coss, NP [address] [place],  [place] [address] PCP: Maximiano Coss, NP   Assessment & Plan: Visit Diagnoses:  1. Radiculopathy, lumbar region   2. Radiculopathy, cervical region     Plan: Impression is cervical spine radiculopathy left upper extremity and lumbar spine radiculopathy left lower extremity.  The patient's symptoms on the left lower extremity correlate with previous MRI findings, however the patient was unable to tolerate the epidural steroid injection.  I would like to refer her to Dr. Lorin Mercy or Louanne Skye for further evaluation and treatment recommendation.  In regards to her neck, we will we will go ahead and order an MRI to further assess for structural abnormalities.  She will further discuss this with Dr. Lorin Mercy or Louanne Skye when she follows up for her back.  In the meantime, have agreed to call in the steroid taper.  Call with concerns or questions in the meantime.  Follow-Up Instructions: Return for with Dr. Norton Blizzard for back/neck.   Orders:  No orders of the defined types were placed in this encounter.  No orders of the defined types were placed in this encounter.     Procedures: No procedures performed   Clinical Data: No additional findings.   Subjective: Chief Complaint  Patient presents with   Left Shoulder - Follow-up   Left Hip - Follow-up    HPI patient is a pleasant 60 year old female who comes in today with recurrent left upper and left lower extremity radiculopathy.  She was seen in our office in September of last year for the same issues.  She was started on a steroid taper and responded well.  At that time, she was also sent to physical therapy which she states made her symptoms worse.  Her symptoms reappeared a few months ago.  No known injury or change in activity.   The pain she has to the left upper extremity is primarily into the parascapular region.  She does note paresthesias into the right hand.  In regards to the left lower extremity, the pain starts in her left lower back and radiates into her buttocks and down the back of the leg.  She has paresthesias down the left leg as well.  The neck pain is worse with cervical spine rotation the back pain is worse when she is standing, walking or riding in the car for long period of time.  She takes chronic oxycodone for which she gets from her PCP.  She was seen by Dr. Rennis Harding several years ago for her back where MRI was ordered.  This showed a far lateral and foraminal protrusion at L3-4 on the left which was likely causing L3 and L4 nerve root impingement.  It sounds like ESI was performed.  She notes that she became short of breath following this injection.  She denies any history of cervical spine MRI or injection.  Review of Systems as detailed in HPI.  All others reviewed and are negative.   Objective: Vital Signs: There were no vitals taken for this visit.  Physical Exam well-developed well-nourished female no acute distress.  Alert and oriented x3.  Ortho Exam cervical spine exam reveals no spinous or  paraspinous tenderness.  She has increased pain with neck flexion and rotation to the right.  Moderate tenderness along the left parascapular region.  No focal weakness.  She is neurovascular intact distally.  Left lower extremity exam shows a positive straight leg raise.  No focal weakness.  She is neurovascular intact distally.  Specialty Comments:  No specialty comments available.  Imaging: No new imaging   PMFS History: Patient Active Problem List   Diagnosis Date Noted   Aortic atherosclerosis ([place]) 07/29/2021   Other fatigue 10/22/2020   Dark stools 10/22/2020   Other psychoactive substance dependence, in remission ([place]) 11/11/2019   Orthostatic hypotension    Syncope 10/11/2019    Screening examination for STD (sexually transmitted disease) 06/20/2019   Vaginal discharge 06/20/2019   Dysuria 06/20/2019   Bacterial vaginosis 06/20/2019   Hematuria of unknown cause 06/20/2019   Acute non-recurrent sinusitis 03/28/2019   Overdose    Melena    Suicidal ideation 07/09/2018   Anemia 07/09/2018   Cocaine abuse ([place]) 05/24/2018   Hypomagnesemia 05/24/2018   Chronic left hip pain 05/24/2018   Hypokalemia 05/23/2018   Tylenol overdose 05/23/2018   Abdominal pain 05/23/2018   Emphysema/COPD ([place]) 05/23/2018   Serrated adenoma of colon 08/05/2016   Localization-related idiopathic epilepsy and epileptic syndromes with seizures of localized onset, not intractable, without status epilepticus ([place]) 10/16/2015   Lumbar disc herniation 08/07/2015   Seizure disorder ([place]) 09/09/2014   Asthma with acute exacerbation 03/09/2013   Hypertension 03/09/2013   Lower back pain 03/09/2013   Chronic hepatitis C without hepatic coma ([place]) 03/08/2013   Smoker 12/12/2012   Schizoaffective disorder, bipolar type ([place]) 12/12/2012   Past Medical History:  Diagnosis Date   Allergy    Alopecia    Anxiety    Arthritis    Asthma    Asthma    Phreesia 10/22/2020   Cataract    "mild"   COPD (chronic obstructive pulmonary disease) ([place])    Depression    Depression    Phreesia 10/22/2020   GERD (gastroesophageal reflux disease)    o cc- takes OTC if needed.   Glaucoma    Hepatitis C    Hypertension    onset age 67.   Iron deficiency anemia    Migraine    Schizoaffective disorder (HCC)    Sciatic pain    Seizures (HCC)    onset in childhood. 06-03-16- per pt 8 months agoGeneralized tonic-clonic.Last seizure couple of months ago- last sz 9-10 months ago per pt 10-12-2016   Shortness of breath dyspnea    Substance abuse ([place])    Ulcer     Family History  Problem Relation Age of Onset   Diabetes type II Mother    Hypertension Mother    Arthritis Mother    Diabetes Mother     Mental illness Mother        bipolar   Diabetes type II Maternal Aunt    Hypertension Father    Heart murmur Father    Arthritis Father    Stroke Father    Alopecia Sister    Alopecia Brother    Alopecia Sister    Mental retardation Sister        depression   Prostate cancer Paternal Grandfather    Cancer Maternal Uncle    Colon cancer Neg Hx    Esophageal cancer Neg Hx    Rectal cancer Neg Hx    Stomach cancer Neg Hx    Colon polyps Neg Hx  Past Surgical History:  Procedure Laterality Date   ABDOMINAL HYSTERECTOMY     ovarian cyst B, cervical dysplasia, fibroids.   Ovaries intact.   BIOPSY  07/11/2018   Procedure: BIOPSY;  Surgeon: Yetta Flock, MD;  Location: [facility];  Service: Gastroenterology;;   COLONOSCOPY     COLONOSCOPY W/ BIOPSIES     DILATION AND CURETTAGE OF UTERUS     x2   ESOPHAGOGASTRODUODENOSCOPY (EGD) WITH PROPOFOL N/A 07/11/2018   Procedure: ESOPHAGOGASTRODUODENOSCOPY (EGD) WITH PROPOFOL;  Surgeon: Yetta Flock, MD;  Location: [facility];  Service: Gastroenterology;  Laterality: N/A;   EXPLORATORY LAPAROTOMY     x3   GYNECOLOGIC CRYOSURGERY     x3   HOT HEMOSTASIS N/A 07/11/2018   Procedure: HOT HEMOSTASIS (ARGON PLASMA COAGULATION/BICAP);  Surgeon: Yetta Flock, MD;  Location: [facility];  Service: Gastroenterology;  Laterality: N/A;   MANDIBLE RECONSTRUCTION N/A 06/27/2015   Procedure: REMOVAL MAXILLARY PALATAL TORUS.  REMOVAL MANDIBULAR TORUS AND EXOSTOSIS.  ;  Surgeon: Diona Browner, DDS;  Location: [facility];  Service: Oral Surgery;  Laterality: N/A;   OVARIAN CYST REMOVAL  2008   POLYPECTOMY     Social History   Occupational History   Occupation: disabled    Comment: mental illness; seizures  Tobacco Use   Smoking status: Every Day    Packs/day: 0.50    Years: 44.00    Pack years: 22.00    Types: Cigarettes   Smokeless tobacco: Never  Vaping Use   Vaping Use: Never used  Substance and Sexual Activity   Alcohol  use: Yes    Alcohol/week: 0.0 standard drinks    Comment: 1 pakj every 3 days   Drug use: Not Currently    Types: Marijuana, Cocaine    Comment: not used in 5 months    Sexual activity: Not Currently    Birth control/protection: None    Comment: widow

## 2021-09-22 ENCOUNTER — Other Ambulatory Visit: Payer: Self-pay

## 2021-09-22 ENCOUNTER — Telehealth: Payer: Self-pay

## 2021-09-22 ENCOUNTER — Ambulatory Visit: Payer: Self-pay

## 2021-09-22 ENCOUNTER — Ambulatory Visit (INDEPENDENT_AMBULATORY_CARE_PROVIDER_SITE_OTHER): Payer: Medicare Other | Admitting: Orthopaedic Surgery

## 2021-09-22 VITALS — BP 149/80 | HR 79

## 2021-09-22 DIAGNOSIS — G8929 Other chronic pain: Secondary | ICD-10-CM

## 2021-09-22 DIAGNOSIS — M542 Cervicalgia: Secondary | ICD-10-CM

## 2021-09-22 DIAGNOSIS — M5442 Lumbago with sciatica, left side: Secondary | ICD-10-CM | POA: Diagnosis not present

## 2021-09-22 NOTE — Progress Notes (Signed)
Office Visit Note   Patient: Melissa Bridges           Date of Birth: [birth year]           MRN: [id number] Visit Date: 09/22/2021              Requested by: Maximiano Coss, NP [address] [place],  [place] [address] PCP: Maximiano Coss, NP   Assessment & Plan: Visit Diagnoses:  1. Chronic left-sided low back pain with left-sided sciatica   2. Neck pain     Plan: We will set her up for MRI scan cervical spine again.  Portance of arranging a ride so she can complete her study and then return to the office for review discussed in detail.  Office follow-up after cervical MRI scan.  Follow-Up Instructions: No follow-ups on file.   Orders:  Orders Placed This Encounter  Procedures   XR Cervical Spine 2 or 3 views   XR Lumbar Spine 2-3 Views   No orders of the defined types were placed in this encounter.     Procedures: No procedures performed   Clinical Data: No additional findings.   Subjective: Chief Complaint  Patient presents with   Neck - Pain   Lower Back - Pain    HPI 60 year old female with schizoaffective disorder with problems with chronic neck and low back pain greater than 5 years.  She was put in for an MRI scan cervical spine by Dr.Xu but had transportation problems and could not get to her scan.  She has had neck pain that radiates into her arms.  Problems with pain in her back with activities.  Problems with numbness and tingling.  No myelopathic symptoms.  Patient been seen by Dr. Bennie Pierini Cohen's office in the past and no surgery lumbar was recommended.  She been on oxycodone 10/325 70 tablets at a time.  More recently on reduced dose tramadol.  She is to be on Opana patches.  Review of Systems follow systems updated unchanged from 09/08/2021 office visit.   Objective: Vital Signs: BP (!) 149/80   Pulse 79   Physical Exam Constitutional:      Appearance: She is well-developed.  HENT:     Head: Normocephalic.     Right Ear: External ear  normal.     Left Ear: External ear normal. There is no impacted cerumen.  Eyes:     Pupils: Pupils are equal, round, and reactive to light.  Neck:     Thyroid: No thyromegaly.     Trachea: No tracheal deviation.  Cardiovascular:     Rate and Rhythm: Normal rate.  Pulmonary:     Effort: Pulmonary effort is normal.  Abdominal:     Palpations: Abdomen is soft.  Musculoskeletal:     Cervical back: No rigidity.  Skin:    General: Skin is warm and dry.  Neurological:     Mental Status: She is alert and oriented to person, place, and time.  Psychiatric:        Behavior: Behavior normal.    Ortho Exam patient has left AC joint chronic separation with prominence of distal clavicle on the left normal on the right.  Brachial plexus tenderness right and left.  Positive Spurling right and left.  No atrophy of the upper extremities she has diffuse biceps triceps wrist flexion extension grip weakness.  Mild intention tremor on the left upper extremities.  Normal heel toe gait.  Negative straight leg raising 90 degrees.  No lower extremity clonus.  Specialty Comments:  No specialty comments available.  Imaging: XR Cervical Spine 2 or 3 views  Result Date: 09/22/2021 AP lateral cervical spine images are obtained and reviewed.  This shows reversal of cervical curvature with retrolisthesis C5-6.  Narrowing at C5-6 more than C4-5 and C6-7.  Degenerative facet changes. Impression: Mid cervical spondylosis with retrolisthesis C5-6.  XR Lumbar Spine 2-3 Views  Result Date: 09/22/2021 AP lateral lumbar spine images are obtained and reviewed mild left lumbar curvature.  Multilevel disc degeneration with some anterolisthesis grade 1 L3-4 degenerative facet with disc base narrowing lower 3 levels. Impression: Lumbar disc degeneration facet arthropathy with multilevel disc space narrowing as described above.    PMFS History: Patient Active Problem List   Diagnosis Date Noted   Aortic atherosclerosis  ([place]) 07/29/2021   Other fatigue 10/22/2020   Dark stools 10/22/2020   Other psychoactive substance dependence, in remission ([place]) 11/11/2019   Orthostatic hypotension    Syncope 10/11/2019   Screening examination for STD (sexually transmitted disease) 06/20/2019   Vaginal discharge 06/20/2019   Dysuria 06/20/2019   Bacterial vaginosis 06/20/2019   Hematuria of unknown cause 06/20/2019   Acute non-recurrent sinusitis 03/28/2019   Overdose    Melena    Suicidal ideation 07/09/2018   Anemia 07/09/2018   Cocaine abuse ([place]) 05/24/2018   Hypomagnesemia 05/24/2018   Chronic left hip pain 05/24/2018   Hypokalemia 05/23/2018   Tylenol overdose 05/23/2018   Abdominal pain 05/23/2018   Emphysema/COPD ([place]) 05/23/2018   Serrated adenoma of colon 08/05/2016   Localization-related idiopathic epilepsy and epileptic syndromes with seizures of localized onset, not intractable, without status epilepticus ([place]) 10/16/2015   Lumbar disc herniation 08/07/2015   Seizure disorder ([place]) 09/09/2014   Asthma with acute exacerbation 03/09/2013   Hypertension 03/09/2013   Lower back pain 03/09/2013   Chronic hepatitis C without hepatic coma ([place]) 03/08/2013   Smoker 12/12/2012   Schizoaffective disorder, bipolar type ([place]) 12/12/2012   Past Medical History:  Diagnosis Date   Allergy    Alopecia    Anxiety    Arthritis    Asthma    Asthma    Phreesia 10/22/2020   Cataract    "mild"   COPD (chronic obstructive pulmonary disease) ([place])    Depression    Depression    Phreesia 10/22/2020   GERD (gastroesophageal reflux disease)    o cc- takes OTC if needed.   Glaucoma    Hepatitis C    Hypertension    onset age 67.   Iron deficiency anemia    Migraine    Schizoaffective disorder (HCC)    Sciatic pain    Seizures (HCC)    onset in childhood. 06-03-16- per pt 8 months agoGeneralized tonic-clonic.Last seizure couple of months ago- last sz 9-10 months ago per pt 10-12-2016   Shortness of  breath dyspnea    Substance abuse ([place])    Ulcer     Family History  Problem Relation Age of Onset   Diabetes type II Mother    Hypertension Mother    Arthritis Mother    Diabetes Mother    Mental illness Mother        bipolar   Diabetes type II Maternal Aunt    Hypertension Father    Heart murmur Father    Arthritis Father    Stroke Father    Alopecia Sister    Alopecia Brother    Alopecia Sister    Mental retardation Sister  depression   Prostate cancer Paternal Grandfather    Cancer Maternal Uncle    Colon cancer Neg Hx    Esophageal cancer Neg Hx    Rectal cancer Neg Hx    Stomach cancer Neg Hx    Colon polyps Neg Hx     Past Surgical History:  Procedure Laterality Date   ABDOMINAL HYSTERECTOMY     ovarian cyst B, cervical dysplasia, fibroids.   Ovaries intact.   BIOPSY  07/11/2018   Procedure: BIOPSY;  Surgeon: Yetta Flock, MD;  [facility];  Service: Gastroenterology;;   COLONOSCOPY     COLONOSCOPY W/ BIOPSIES     DILATION AND CURETTAGE OF UTERUS     x2   ESOPHAGOGASTRODUODENOSCOPY (EGD) WITH PROPOFOL N/A 07/11/2018   Procedure: ESOPHAGOGASTRODUODENOSCOPY (EGD) WITH PROPOFOL;  Surgeon: Yetta Flock, MD;  Location: [facility];  Service: Gastroenterology;  Laterality: N/A;   EXPLORATORY LAPAROTOMY     x3   GYNECOLOGIC CRYOSURGERY     x3   HOT HEMOSTASIS N/A 07/11/2018   Procedure: HOT HEMOSTASIS (ARGON PLASMA COAGULATION/BICAP);  Surgeon: Yetta Flock, MD;  Location: [facility];  Service: Gastroenterology;  Laterality: N/A;   MANDIBLE RECONSTRUCTION N/A 06/27/2015   Procedure: REMOVAL MAXILLARY PALATAL TORUS.  REMOVAL MANDIBULAR TORUS AND EXOSTOSIS.  ;  Surgeon: Diona Browner, DDS;  Location: [facility];  Service: Oral Surgery;  Laterality: N/A;   OVARIAN CYST REMOVAL  2008   POLYPECTOMY     Social History   Occupational History   Occupation: disabled    Comment: mental illness; seizures  Tobacco Use   Smoking status:  Every Day    Packs/day: 0.50    Years: 44.00    Pack years: 22.00    Types: Cigarettes   Smokeless tobacco: Never  Vaping Use   Vaping Use: Never used  Substance and Sexual Activity   Alcohol use: Yes    Alcohol/week: 0.0 standard drinks    Comment: 1 pakj every 3 days   Drug use: Not Currently    Types: Marijuana, Cocaine    Comment: not used in 5 months    Sexual activity: Not Currently    Birth control/protection: None    Comment: widow

## 2021-09-22 NOTE — Telephone Encounter (Signed)
Can we get this cspine MRI rescheduled for her?is the order still good?

## 2021-09-23 NOTE — Telephone Encounter (Signed)
I called and left message on patient's work number voice mail (listed as preferred number in chart) -- she can call [facility] directly to schedule her MRI. The order is still good.

## 2021-09-24 ENCOUNTER — Other Ambulatory Visit: Payer: Self-pay | Admitting: Registered Nurse

## 2021-09-24 DIAGNOSIS — M62838 Other muscle spasm: Secondary | ICD-10-CM

## 2021-09-24 NOTE — Telephone Encounter (Signed)
Patient is requesting a refill of the following medications: Requested Prescriptions   Pending Prescriptions Disp Refills   methocarbamol (ROBAXIN) 500 MG tablet [Pharmacy Med Name: Methocarbamol 500 MG Oral Tablet] 60 tablet 0    Sig: Take 1 tablet by mouth 4 times daily    Date of patient request: 09/24/2021 Last office visit: 07/09/2021 Date of last refill: 08/28/2021 Last refill amount: 60 tablets Follow up time period per chart: none

## 2021-09-29 ENCOUNTER — Encounter: Payer: Self-pay | Admitting: Internal Medicine

## 2021-09-29 ENCOUNTER — Other Ambulatory Visit: Payer: Self-pay

## 2021-09-29 ENCOUNTER — Ambulatory Visit (INDEPENDENT_AMBULATORY_CARE_PROVIDER_SITE_OTHER): Payer: Medicare Other | Admitting: Internal Medicine

## 2021-09-29 VITALS — BP 174/104 | HR 68 | Ht 64.75 in | Wt 117.6 lb

## 2021-09-29 DIAGNOSIS — R0789 Other chest pain: Secondary | ICD-10-CM | POA: Diagnosis not present

## 2021-09-29 DIAGNOSIS — G4710 Hypersomnia, unspecified: Secondary | ICD-10-CM | POA: Diagnosis not present

## 2021-09-29 DIAGNOSIS — R0602 Shortness of breath: Secondary | ICD-10-CM

## 2021-09-29 MED ORDER — AMLODIPINE BESYLATE 5 MG PO TABS: 5.0000 mg | ORAL_TABLET | Freq: Every day | ORAL | 3 refills | Status: DC

## 2021-09-29 NOTE — Progress Notes (Signed)
OFFICE CONSULT NOTE  Chief Complaint:  Chest pressure, shortness of breath  Primary Care Physician: Maximiano Coss, NP  HPI:  Melissa Bridges is a 60 y.o. female who is being seen today for the evaluation of chest pressure, shortness of breath at the request of Maximiano Coss, NP.  This is a pleasant 60 year old female with a history of COPD, asthma, schizoaffective disorder, unintentional Tylenol overdose, chronic pain, seizures and numerous surgical procedures.  She is referred for shortness of breath and chest pressure.  Recently she has been having more shortness of breath and pressure in her chest.  She says it feels like a heaviness that is not necessarily worse with exertion or relieved by rest.  She has had some flares of her COPD/asthma which seems to respond to steroids but not as much due to inhalers.  She reports that she had been on amlodipine in the past for hypertension but this was stopped due to an admission for hypotension.  Blood pressure however today was markedly elevated 174/104.  She had an echocardiogram which showed hyperdynamic LV function and an elevated LVEDP with high blood pressure in 2020.  She is currently on no medication for hypertension.  PMHx:  Past Medical History:  Diagnosis Date   Allergy    Alopecia    Anxiety    Arthritis    Asthma    Asthma    Phreesia 10/22/2020   Cataract    "mild"   COPD (chronic obstructive pulmonary disease) ([place])    Depression    Depression    Phreesia 10/22/2020   GERD (gastroesophageal reflux disease)    o cc- takes OTC if needed.   Glaucoma    Hepatitis C    Hypertension    onset age 72.   Iron deficiency anemia    Migraine    Schizoaffective disorder (HCC)    Sciatic pain    Seizures (HCC)    onset in childhood. 06-03-16- per pt 8 months agoGeneralized tonic-clonic.Last seizure couple of months ago- last sz 9-10 months ago per pt 10-12-2016   Shortness of breath dyspnea    Substance abuse ([place])     Ulcer     Past Surgical History:  Procedure Laterality Date   ABDOMINAL HYSTERECTOMY     ovarian cyst B, cervical dysplasia, fibroids.   Ovaries intact.   BIOPSY  07/11/2018   Procedure: BIOPSY;  Surgeon: Yetta Flock, MD;  Location: [facility];  Service: Gastroenterology;;   COLONOSCOPY     COLONOSCOPY W/ BIOPSIES     DILATION AND CURETTAGE OF UTERUS     x2   ESOPHAGOGASTRODUODENOSCOPY (EGD) WITH PROPOFOL N/A 07/11/2018   Procedure: ESOPHAGOGASTRODUODENOSCOPY (EGD) WITH PROPOFOL;  Surgeon: Yetta Flock, MD;  Location: [facility];  Service: Gastroenterology;  Laterality: N/A;   EXPLORATORY LAPAROTOMY     x3   GYNECOLOGIC CRYOSURGERY     x3   HOT HEMOSTASIS N/A 07/11/2018   Procedure: HOT HEMOSTASIS (ARGON PLASMA COAGULATION/BICAP);  Surgeon: Yetta Flock, MD;  Location: [facility];  Service: Gastroenterology;  Laterality: N/A;   MANDIBLE RECONSTRUCTION N/A 06/27/2015   Procedure: REMOVAL MAXILLARY PALATAL TORUS.  REMOVAL MANDIBULAR TORUS AND EXOSTOSIS.  ;  Surgeon: Diona Browner, DDS;  Location: [place];  Service: Oral Surgery;  Laterality: N/A;   OVARIAN CYST REMOVAL  2008   POLYPECTOMY      FAMHx:  Family History  Problem Relation Age of Onset   Diabetes type II Mother    Hypertension Mother  Arthritis Mother    Diabetes Mother    Mental illness Mother        bipolar   Diabetes type II Maternal Aunt    Hypertension Father    Heart murmur Father    Arthritis Father    Stroke Father    Alopecia Sister    Alopecia Brother    Alopecia Sister    Mental retardation Sister        depression   Prostate cancer Paternal Grandfather    Cancer Maternal Uncle    Colon cancer Neg Hx    Esophageal cancer Neg Hx    Rectal cancer Neg Hx    Stomach cancer Neg Hx    Colon polyps Neg Hx     SOCHx:   reports that she has been smoking cigarettes. She has a 22.00 pack-year smoking history. She has never used smokeless tobacco. She reports current alcohol  use. She reports that she does not currently use drugs after having used the following drugs: Marijuana and Cocaine.  ALLERGIES:  Allergies  Allergen Reactions   Fruit & Vegetable Daily [Nutritional Supplements] Shortness Of Breath    Reaction to Aloe   Iodinated Diagnostic Agents Swelling    Swelling and itching of left side of her face only after CT SI injection(no steroid used)   Ibuprofen Other (See Comments)    Causes Stomach upset; However, pt can take Meloxicam without incident and takes this at home.    Aloe Vera Rash   Aspirin Rash and Other (See Comments)    Stomach upset    ROS: Pertinent items noted in HPI and remainder of comprehensive ROS otherwise negative.  HOME MEDS: Current Outpatient Medications on File Prior to Visit  Medication Sig Dispense Refill   acetaminophen (TYLENOL) 500 MG tablet Take 2,000-2,500 mg by mouth every 4 (four) hours as needed (pain).     albuterol (PROVENTIL HFA) 108 (90 Base) MCG/ACT inhaler Inhale 1-2 puffs into the lungs every 6 (six) hours as needed for wheezing or shortness of breath. 18 g 11   Blood Pressure Monitor KIT Dispense one blood pressure monitor to blood pressure 1 kit 0   budesonide-formoterol (SYMBICORT) 160-4.5 MCG/ACT inhaler TAKE 2 PUFFS BY MOUTH TWICE A DAY 10.2 each 2   citalopram (CELEXA) 40 MG tablet Take 0.5 tablets (20 mg total) by mouth daily. 90 tablet 3   divalproex (DEPAKOTE) 500 MG DR tablet Take 1 tablet (500 mg total) by mouth 3 (three) times daily. 270 tablet 3   ferrous sulfate 325 (65 FE) MG tablet Take by mouth.     folic acid (FOLVITE) 1 MG tablet Take by mouth.     gabapentin (NEURONTIN) 400 MG capsule Take 1 capsule (400 mg total) by mouth 3 (three) times daily. 270 capsule 1   hydrOXYzine (ATARAX/VISTARIL) 10 MG tablet Take 1 tablet (10 mg total) by mouth 3 (three) times daily as needed. 30 tablet 5   ipratropium-albuterol (DUONEB) 0.5-2.5 (3) MG/3ML SOLN Take 3 mLs by nebulization every 6 (six) hours  as needed (shortness of breath). Reported on 12/02/2015 360 mL 1   methocarbamol (ROBAXIN) 500 MG tablet Take 1 tablet by mouth 4 times daily 60 tablet 0   mirabegron ER (MYRBETRIQ) 50 MG TB24 tablet Take 1 tablet (50 mg total) by mouth daily. 90 tablet 1   montelukast (SINGULAIR) 10 MG tablet TAKE 1 TABLET BY MOUTH EVERYDAY AT BEDTIME 90 tablet 1   Multiple Vitamin (MULTIVITAMIN WITH MINERALS) TABS tablet Take 1 tablet  by mouth daily. 30 tablet 1   nystatin (MYCOSTATIN) 100000 UNIT/ML suspension Take 5 mLs (500,000 Units total) by mouth 4 (four) times daily. 60 mL 0   ondansetron (ZOFRAN) 4 MG tablet Take 1 tablet (4 mg total) by mouth every 6 (six) hours. 12 tablet 0   oxyCODONE-acetaminophen (PERCOCET) 10-325 MG tablet Take 1 tablet by mouth 5 (five) times daily.     pantoprazole (PROTONIX) 20 MG tablet TAKE 1 TABLET BY MOUTH EVERY DAY 90 tablet 0   QUEtiapine (SEROQUEL XR) 50 MG TB24 24 hr tablet Take 1 tablet (50 mg total) by mouth at bedtime. 30 tablet 3   thiamine 100 MG tablet Take 1 tablet (100 mg total) by mouth daily. 90 tablet 1   triamcinolone cream (KENALOG) 0.1 % Apply 1 application topically 2 (two) times daily as needed (rash/irritation). 80 g 3   valACYclovir (VALTREX) 1000 MG tablet TAKE 2 TABLETS BY MOUTH AT SYMPTOM ONSET, REPEAT IN 12 HOURS 20 tablet 2   vitamin C (ASCORBIC ACID) 250 MG tablet Take 250 mg by mouth daily.     predniSONE (STERAPRED UNI-PAK 21 TAB) 5 MG (21) TBPK tablet Take as directed (Patient not taking: Reported on 09/29/2021) 21 tablet 0   tiZANidine (ZANAFLEX) 4 MG tablet TAKE 1 TABLET (4 MG TOTAL) BY MOUTH 3 (THREE) TIMES DAILY AS NEEDED FOR MUSCLE SPASMS. (Patient not taking: Reported on 09/29/2021) 90 tablet 3   No current facility-administered medications on file prior to visit.    LABS/IMAGING: No results found for this or any previous visit (from the past 48 hour(s)). No results found.  LIPID PANEL:    Component Value Date/Time   CHOL 184  04/02/2021 1405   CHOL 219 (H) 07/23/2020 1154   TRIG 75.0 04/02/2021 1405   HDL 77.70 04/02/2021 1405   HDL 88 07/23/2020 1154   CHOLHDL 2 04/02/2021 1405   VLDL 15.0 04/02/2021 1405   LDLCALC 91 04/02/2021 1405   LDLCALC 116 (H) 07/23/2020 1154    WEIGHTS: Wt Readings from Last 3 Encounters:  09/29/21 117 lb 9.6 oz (53.3 kg)  07/09/21 112 lb 12.8 oz (51.2 kg)  06/03/21 106 lb 6.4 oz (48.3 kg)    VITALS: BP (!) 174/104   Pulse 68   Ht 5' 4.75" (1.645 m)   Wt 117 lb 9.6 oz (53.3 kg)   SpO2 98%   BMI 19.72 kg/m   EXAM: General appearance: alert, no distress, and thin Neck: no carotid bruit, no JVD, and thyroid not enlarged, symmetric, no tenderness/mass/nodules Lungs: diminished breath sounds bilaterally and wheezes bilaterally Heart: regular rate and rhythm, S1, S2 normal, no murmur, click, rub or gallop Abdomen: soft, non-tender; bowel sounds normal; no masses,  no organomegaly and scaphoid Extremities: extremities normal, atraumatic, no cyanosis or edema Pulses: 2+ and symmetric Skin: Skin color, texture, turgor normal. No rashes or lesions Neurologic: Grossly normal Psych: Pleasant  *Examination chaperoned by Melissa Apley, RN  EKG: Normal sinus rhythm at 68, biatrial enlargement- personally reviewed  ASSESSMENT: Chest pressure, dyspnea on exertion History of COPD/asthma Uncontrolled hypertension  PLAN: 1.   Ms. Luepke is describing some chest pressure and dyspnea on exertion.  I think this is more related to asthma/COPD.  She is wheezy on exam today.  There are risk factors for coronary disease and I would recommend a Lexiscan Myoview.  If this is low risk then we could feel more comfortable about this not being ischemia.  Her blood pressure is elevated as evidenced by  elevated LVEDP on echo previously and I suspect she has uncontrolled hypertension.  She thinks this is all related to uncontrolled pain however I believe she does have hypertension.  I am not sure  the circumstances about her low blood pressure but she was taken off of amlodipine.  Would recommend restarting 5 mg daily.  She may also benefit from a diuretic.  We will follow-up with the results of her stress test and if abnormal, further work-up will commence.  Otherwise she can follow-up for hypertension management with her primary care provider.  Thanks again for the kind referral.  Melissa Casino, MD, FACC, [place] Director of the [facility] Diplomate of the American Board of Clinical Lipidology Attending Cardiologist  Direct Dial: [phone]  Fax: [phone]  Website:  [email or web address] Melissa Bridges 09/29/2021, 10:17 AM

## 2021-09-29 NOTE — Patient Instructions (Signed)
Medication Instructions:  START amlodipine 5mg  daily for blood pressure  *If you need a refill on your cardiac medications before your next appointment, please call your pharmacy*   Testing/Procedures: Dr. Debara Pickett has ordered a Lexiscan Myocardial Perfusion Imaging Study. This test is done at [address] [place]  Please arrive 15 minutes prior to your appointment time for registration and insurance purposes.   The test will take approximately 3 to 4 hours to complete; you may bring reading material.  If someone comes with you to your appointment, they will need to remain in the main lobby due to limited space in the testing area. **If you are pregnant or breastfeeding, please notify the nuclear lab prior to your appointment**   How to prepare for your Myocardial Perfusion Test: Do not eat or drink 3 hours prior to your test, except you may have water. Do not consume products containing caffeine (regular or decaffeinated) 12 hours prior to your test. (ex: coffee, chocolate, sodas, tea). Do wear comfortable clothes (no dresses or overalls) and walking shoes, tennis shoes preferred (No heels or open toe shoes are allowed). Do NOT wear cologne, perfume, aftershave, or lotions (deodorant is allowed). If you use an inhaler, use it the AM of your test and bring it with you.  If you use a nebulizer, use it the AM of your test.  If these instructions are not followed, your test will have to be rescheduled.    Follow-Up: At [facility], you and your health needs are our priority.  As part of our continuing mission to provide you with exceptional heart care, we have created designated Provider Care Teams.  These Care Teams include your primary Cardiologist (physician) and Advanced Practice Providers (APPs -  Physician Assistants and Nurse Practitioners) who all work together to provide you with the care you need, when you need it.  We recommend signing up for the patient  portal called "MyChart".  Sign up information is provided on this After Visit Summary.  MyChart is used to connect with patients for Virtual Visits (Telemedicine).  Patients are able to view lab/test results, encounter notes, upcoming appointments, etc.  Non-urgent messages can be sent to your provider as well.   To learn more about what you can do with MyChart, go to [email or web address].    Your next appointment:   Appointment will be scheduled if needed, based on stress test results

## 2021-09-30 ENCOUNTER — Telehealth: Payer: Medicare Other | Admitting: Registered Nurse

## 2021-09-30 ENCOUNTER — Telehealth (INDEPENDENT_AMBULATORY_CARE_PROVIDER_SITE_OTHER): Payer: Medicare Other | Admitting: Family Medicine

## 2021-09-30 ENCOUNTER — Encounter: Payer: Self-pay | Admitting: Family Medicine

## 2021-09-30 DIAGNOSIS — R0602 Shortness of breath: Secondary | ICD-10-CM | POA: Diagnosis not present

## 2021-09-30 DIAGNOSIS — J4541 Moderate persistent asthma with (acute) exacerbation: Secondary | ICD-10-CM | POA: Diagnosis not present

## 2021-09-30 MED ORDER — MONTELUKAST SODIUM 10 MG PO TABS: ORAL_TABLET | ORAL | 1 refills | Status: DC

## 2021-09-30 MED ORDER — PREDNISONE 20 MG PO TABS: 40.0000 mg | ORAL_TABLET | Freq: Every day | ORAL | 0 refills | Status: AC

## 2021-09-30 NOTE — Progress Notes (Signed)
Chief Complaint  Patient presents with   Cough   Wheezing   Sore Throat   Back Pain    Melissa Bridges here for URI complaints. Due to COVID-19 pandemic, we are interacting via telephonet. I verified patient's ID using 2 identifiers. Patient agreed to proceed with visit via this method. Patient is at home, I am at office. Patient and I are present for visit.   Duration: 5 days  Associated symptoms: sinus congestion, rhinorrhea, ear pain, sore throat, wheezing, shortness of breath, chest tightness, myalgia, and sneezing, coughing Denies: sinus pain, ear drainage, and fevers, V/D, loss of taste/smell Treatment to date: Vit C, Tylenol, Robitussin Sick contacts: No  Past Medical History:  Diagnosis Date   Allergy    Alopecia    Anxiety    Arthritis    Asthma    Asthma    Phreesia 10/22/2020   Cataract    "mild"   COPD (chronic obstructive pulmonary disease) ([place])    Depression    Depression    Phreesia 10/22/2020   GERD (gastroesophageal reflux disease)    o cc- takes OTC if needed.   Glaucoma    Hepatitis C    Hypertension    onset age 89.   Iron deficiency anemia    Migraine    Schizoaffective disorder (HCC)    Sciatic pain    Seizures (HCC)    onset in childhood. 06-03-16- per pt 8 months agoGeneralized tonic-clonic.Last seizure couple of months ago- last sz 9-10 months ago per pt 10-12-2016   Shortness of breath dyspnea    Substance abuse (HCC)    Ulcer     Objective No conversational dyspnea Age appropriate judgment and insight Nml affect and mood  Moderate persistent asthma with acute exacerbation - Plan: predniSONE (DELTASONE) 20 MG tablet  5 d pred burst 40 mg/d.  Continue to push fluids, practice good hand hygiene, cover mouth when coughing. F/u prn. If starting to experience fevers, shaking, or shortness of breath, seek immediate care. Asked for T3's for back pain. Declined, rec'd she follow up with her reg PCP for this.  Total time: 12 min Pt  voiced understanding and agreement to the plan.  [place], DO 09/30/21 2:41 PM

## 2021-10-06 ENCOUNTER — Telehealth: Payer: Self-pay

## 2021-10-06 ENCOUNTER — Other Ambulatory Visit: Payer: Self-pay | Admitting: Registered Nurse

## 2021-10-06 DIAGNOSIS — F40240 Claustrophobia: Secondary | ICD-10-CM

## 2021-10-06 MED ORDER — CLONAZEPAM 0.5 MG PO TABS: 0.5000 mg | ORAL_TABLET | Freq: Two times a day (BID) | ORAL | 0 refills | Status: DC | PRN

## 2021-10-06 NOTE — Telephone Encounter (Signed)
Have sent clonazepam 0.5mg  to take 1-2 tabs before MRI  Thanks,  Rich

## 2021-10-06 NOTE — Telephone Encounter (Signed)
Caller name:Danissa Lonia Blood   On DPR? :yes  Call back number:[phone]  Provider they see: Delfino Lovett  Reason for call:Pt called she has a MRI this coming Saturday and the hospital told her to reach out to her primary care doctor for a medication to help her since she is claustrophobic.  Pt uses  [place], [place]

## 2021-10-06 NOTE — Telephone Encounter (Signed)
Please advise . Patient needs medication due to having an MRI and claustrophobic

## 2021-10-07 NOTE — Telephone Encounter (Signed)
Attempted to call patient to let her know that the medication is sent to he rpharmacy

## 2021-10-09 ENCOUNTER — Other Ambulatory Visit: Payer: Self-pay

## 2021-10-09 ENCOUNTER — Ambulatory Visit (INDEPENDENT_AMBULATORY_CARE_PROVIDER_SITE_OTHER): Payer: Medicare Other | Admitting: Neurology

## 2021-10-09 ENCOUNTER — Encounter: Payer: Self-pay | Admitting: Neurology

## 2021-10-09 ENCOUNTER — Other Ambulatory Visit (INDEPENDENT_AMBULATORY_CARE_PROVIDER_SITE_OTHER): Payer: Medicare Other

## 2021-10-09 VITALS — BP 167/83 | HR 78 | Resp 20 | Ht 63.0 in | Wt 118.0 lb

## 2021-10-09 DIAGNOSIS — M2550 Pain in unspecified joint: Secondary | ICD-10-CM

## 2021-10-09 DIAGNOSIS — R531 Weakness: Secondary | ICD-10-CM

## 2021-10-09 DIAGNOSIS — G40009 Localization-related (focal) (partial) idiopathic epilepsy and epileptic syndromes with seizures of localized onset, not intractable, without status epilepticus: Secondary | ICD-10-CM

## 2021-10-09 DIAGNOSIS — R21 Rash and other nonspecific skin eruption: Secondary | ICD-10-CM

## 2021-10-09 DIAGNOSIS — R413 Other amnesia: Secondary | ICD-10-CM | POA: Diagnosis not present

## 2021-10-09 DIAGNOSIS — R059 Cough, unspecified: Secondary | ICD-10-CM | POA: Diagnosis not present

## 2021-10-09 DIAGNOSIS — M791 Myalgia, unspecified site: Secondary | ICD-10-CM | POA: Diagnosis not present

## 2021-10-09 DIAGNOSIS — B370 Candidal stomatitis: Secondary | ICD-10-CM

## 2021-10-09 DIAGNOSIS — M62838 Other muscle spasm: Secondary | ICD-10-CM

## 2021-10-09 DIAGNOSIS — Z79899 Other long term (current) drug therapy: Secondary | ICD-10-CM

## 2021-10-09 DIAGNOSIS — F419 Anxiety disorder, unspecified: Secondary | ICD-10-CM

## 2021-10-09 DIAGNOSIS — J431 Panlobular emphysema: Secondary | ICD-10-CM

## 2021-10-09 DIAGNOSIS — B001 Herpesviral vesicular dermatitis: Secondary | ICD-10-CM

## 2021-10-09 DIAGNOSIS — R519 Headache, unspecified: Secondary | ICD-10-CM

## 2021-10-09 DIAGNOSIS — R0609 Other forms of dyspnea: Secondary | ICD-10-CM | POA: Diagnosis not present

## 2021-10-09 DIAGNOSIS — F250 Schizoaffective disorder, bipolar type: Secondary | ICD-10-CM

## 2021-10-09 DIAGNOSIS — R350 Frequency of micturition: Secondary | ICD-10-CM

## 2021-10-09 LAB — CBC WITH DIFFERENTIAL/PLATELET
Basophils Absolute: 0 10*3/uL (ref 0.0–0.1)
Basophils Relative: 0.5 % (ref 0.0–3.0)
Eosinophils Absolute: 0 10*3/uL (ref 0.0–0.7)
Eosinophils Relative: 0.5 % (ref 0.0–5.0)
HCT: 44.9 % (ref 36.0–46.0)
Hemoglobin: 15.2 g/dL — ABNORMAL HIGH (ref 12.0–15.0)
Lymphocytes Relative: 22 % (ref 12.0–46.0)
Lymphs Abs: 1.7 10*3/uL (ref 0.7–4.0)
MCHC: 33.9 g/dL (ref 30.0–36.0)
MCV: 106.3 fl — ABNORMAL HIGH (ref 78.0–100.0)
Monocytes Absolute: 0.6 10*3/uL (ref 0.1–1.0)
Monocytes Relative: 7.9 % (ref 3.0–12.0)
Neutro Abs: 5.3 10*3/uL (ref 1.4–7.7)
Neutrophils Relative %: 69.1 % (ref 43.0–77.0)
Platelets: 189 10*3/uL (ref 150.0–400.0)
RBC: 4.23 Mil/uL (ref 3.87–5.11)
RDW: 13.1 % (ref 11.5–15.5)
WBC: 7.6 10*3/uL (ref 4.0–10.5)

## 2021-10-09 LAB — COMPREHENSIVE METABOLIC PANEL
ALT: 61 U/L — ABNORMAL HIGH (ref 0–35)
AST: 73 U/L — ABNORMAL HIGH (ref 0–37)
Albumin: 4.5 g/dL (ref 3.5–5.2)
Alkaline Phosphatase: 90 U/L (ref 39–117)
BUN: 11 mg/dL (ref 6–23)
CO2: 26 mEq/L (ref 19–32)
Calcium: 9.7 mg/dL (ref 8.4–10.5)
Chloride: 96 mEq/L (ref 96–112)
Creatinine, Ser: 0.78 mg/dL (ref 0.40–1.20)
GFR: 81.28 mL/min (ref >60.00)
Glucose, Bld: 87 mg/dL (ref 70–99)
Potassium: 3.4 mEq/L — ABNORMAL LOW (ref 3.5–5.1)
Sodium: 131 mEq/L — ABNORMAL LOW (ref 135–145)
Total Bilirubin: 0.6 mg/dL (ref 0.2–1.2)
Total Protein: 8.3 g/dL (ref 6.0–8.3)

## 2021-10-09 LAB — TSH: TSH: 1.05 u[IU]/mL (ref 0.35–5.50)

## 2021-10-09 LAB — C-REACTIVE PROTEIN: CRP: <1 mg/dL (ref 0.5–20.0)

## 2021-10-09 LAB — VITAMIN B12: Vitamin B-12: 359 pg/mL (ref 211–911)

## 2021-10-09 LAB — BRAIN NATRIURETIC PEPTIDE: Pro B Natriuretic peptide (BNP): 44 pg/mL (ref 0.0–100.0)

## 2021-10-09 LAB — SEDIMENTATION RATE: Sed Rate: 19 mm/hr (ref 0–30)

## 2021-10-09 NOTE — Progress Notes (Signed)
NEUROLOGY FOLLOW UP OFFICE NOTE  Melissa Bridges [id number] 1961-11-11  HISTORY OF PRESENT ILLNESS: I had the pleasure of seeing Melissa Bridges in follow-up in the neurology clinic on 10/09/2021.  The patient was last seen 6 months ago for seizures. She is alone in the office today. She is happy to report that she has not had any seizures in 7 weeks. She states that prior to this, she was having a lot, there was a lot going on and it seems like when she is getting upset, this brings on a seizure. She has anxiety attacks and ran out of hydroxyzine, she is anxious and restless in the office today. She states when she runs out of medication, she does not function well. She is on Depakote 517m TID for mood stabilization and seizures. She is also on Celexa and Seroquel. She has had several falls due to her back and hip. She has left shoulder pain with plans for surgery. She lives with her daughter and reports managing her own medications. She states she has problems remembering, she remembers for very short periods of time. She does not drive.    Lab Results  Component Value Date   TSH 0.28 (L) 04/02/2021   Lab Results  Component Value Date   VITAMINB12 487 07/23/2020     History on Initial Assessment 10/15/2015: This is a pleasant 60yo RH woman with a history of hypertension, schizoaffective disorder, and seizures since childhood, with recurrent seizures. She has "always had seizures." She would usually have a headache or hear a high pitched sound, followed by loss of hearing, then she loses consciousness and has stiffening and shaking. Sometimes she smells blood and feels sick before a seizure. In the past she could feel them coming on but recently they "just hit." She has injured herself and woken up on the ground, hit her head a few times, with tongue bite and urinary incontinence. Her daughter has never seen any of the seizures, but reports her granddaughter has witnessed the stiffening  and shaking, and has seen a couple of episodes where she blanks out for a second or so. The last seizure she is aware of occurred September 2016, however she was told by a friend that she had 2 seizures in her sleep in October 2016, she had woken up feeling sore. Her longest seizure-free interval has been at least 2-3 months. She recalls being on Depakote and Dilantin in the past when she was having seizures "back to back," then when they quieted down some, she was taken off Dilantin and has been on Depakote for many years now. Her daughter reports some compliance issues, a Depakote level in August 2016 was low at 37.1. She is supposed to be taking Depakote DR [id number]. In the past she was instructed to take it TID, but felt better taking them all at the same time. Her daughter reports that she does not take it regularly because it affects her mood. With her schizoaffective disorder, she reports auditory and visual hallucinations. The visual hallucinations are better, she is still hearing things but not like before.   Epilepsy Risk Factors:  There is a strong family history of seizures. Her paternal uncle died from a seizure. Her maternal aunt, mother, and nephew and niece (sister's children) have seizures. Otherwise she had a normal birth and early development.  There is no history of febrile convulsions, CNS infections such as meningitis/encephalitis, significant traumatic brain injury, neurosurgical procedures.  Prior ASMs: Dilantin, Lamotrigine  PAST MEDICAL HISTORY: Past Medical History:  Diagnosis Date   Allergy    Alopecia    Anxiety    Arthritis    Asthma    Asthma    [employer] 10/22/2020   Cataract    "mild"   COPD (chronic obstructive pulmonary disease) ([place])    Depression    Depression    [employer] 10/22/2020   GERD (gastroesophageal reflux disease)    o cc- takes OTC if needed.   Glaucoma    Hepatitis C    Hypertension    onset age 16.   Iron deficiency anemia    Migraine     Schizoaffective disorder (HCC)    Sciatic pain    Seizures (HCC)    onset in childhood. 06-03-16- per pt 8 months agoGeneralized tonic-clonic.Last seizure couple of months ago- last sz 9-10 months ago per pt 10-12-2016   Shortness of breath dyspnea    Substance abuse (HCC)    Ulcer     MEDICATIONS: Current Outpatient Medications on File Prior to Visit  Medication Sig Dispense Refill   acetaminophen (TYLENOL) 500 MG tablet Take 2,000-2,500 mg by mouth every 4 (four) hours as needed (pain).     albuterol (PROVENTIL HFA) 108 (90 Base) MCG/ACT inhaler Inhale 1-2 puffs into the lungs every 6 (six) hours as needed for wheezing or shortness of breath. 18 g 11   amLODipine (NORVASC) 5 MG tablet Take 1 tablet (5 mg total) by mouth daily. 90 tablet 3   Blood Pressure Monitor KIT Dispense one blood pressure monitor to blood pressure 1 kit 0   budesonide-formoterol (SYMBICORT) 160-4.5 MCG/ACT inhaler TAKE 2 PUFFS BY MOUTH TWICE A DAY 10.2 each 2   citalopram (CELEXA) 40 MG tablet Take 0.5 tablets (20 mg total) by mouth daily. 90 tablet 3   clonazePAM (KLONOPIN) 0.5 MG tablet Take 1 tablet (0.5 mg total) by mouth 2 (two) times daily as needed for anxiety. 3 tablet 0   divalproex (DEPAKOTE) 500 MG DR tablet Take 1 tablet (500 mg total) by mouth 3 (three) times daily. 270 tablet 3   ferrous sulfate 325 (65 FE) MG tablet Take by mouth.     folic acid (FOLVITE) 1 MG tablet Take by mouth.     gabapentin (NEURONTIN) 400 MG capsule Take 1 capsule (400 mg total) by mouth 3 (three) times daily. 270 capsule 1   hydrOXYzine (ATARAX/VISTARIL) 10 MG tablet Take 1 tablet (10 mg total) by mouth 3 (three) times daily as needed. 30 tablet 5   ipratropium-albuterol (DUONEB) 0.5-2.5 (3) MG/3ML SOLN Take 3 mLs by nebulization every 6 (six) hours as needed (shortness of breath). Reported on 12/02/2015 360 mL 1   methocarbamol (ROBAXIN) 500 MG tablet Take 1 tablet by mouth 4 times daily 60 tablet 0   mirabegron ER  (MYRBETRIQ) 50 MG TB24 tablet Take 1 tablet (50 mg total) by mouth daily. 90 tablet 1   montelukast (SINGULAIR) 10 MG tablet TAKE 1 TABLET BY MOUTH EVERYDAY AT BEDTIME 90 tablet 1   Multiple Vitamin (MULTIVITAMIN WITH MINERALS) TABS tablet Take 1 tablet by mouth daily. 30 tablet 1   nystatin (MYCOSTATIN) 100000 UNIT/ML suspension Take 5 mLs (500,000 Units total) by mouth 4 (four) times daily. 60 mL 0   ondansetron (ZOFRAN) 4 MG tablet Take 1 tablet (4 mg total) by mouth every 6 (six) hours. 12 tablet 0   oxyCODONE-acetaminophen (PERCOCET) 10-325 MG tablet Take 1 tablet by mouth 5 (five) times daily.     pantoprazole (  PROTONIX) 20 MG tablet TAKE 1 TABLET BY MOUTH EVERY DAY 90 tablet 0   QUEtiapine (SEROQUEL XR) 50 MG TB24 24 hr tablet Take 1 tablet (50 mg total) by mouth at bedtime. 30 tablet 3   thiamine 100 MG tablet Take 1 tablet (100 mg total) by mouth daily. 90 tablet 1   triamcinolone cream (KENALOG) 0.1 % Apply 1 application topically 2 (two) times daily as needed (rash/irritation). 80 g 3   valACYclovir (VALTREX) 1000 MG tablet TAKE 2 TABLETS BY MOUTH AT SYMPTOM ONSET, REPEAT IN 12 HOURS 20 tablet 2   vitamin C (ASCORBIC ACID) 250 MG tablet Take 250 mg by mouth daily.     No current facility-administered medications on file prior to visit.    ALLERGIES: Allergies  Allergen Reactions   Fruit & Vegetable Daily [Nutritional Supplements] Shortness Of Breath    Reaction to Aloe   Iodinated Diagnostic Agents Swelling    Swelling and itching of left side of her face only after CT SI injection(no steroid used)   Ibuprofen Other (See Comments)    Causes Stomach upset; However, pt can take Meloxicam without incident and takes this at home.    Aloe Vera Rash   Aspirin Rash and Other (See Comments)    Stomach upset    FAMILY HISTORY: Family History  Problem Relation Age of Onset   Diabetes type II Mother    Hypertension Mother    Arthritis Mother    Diabetes Mother    Mental illness  Mother        bipolar   Diabetes type II Maternal Aunt    Hypertension Father    Heart murmur Father    Arthritis Father    Stroke Father    Alopecia Sister    Alopecia Brother    Alopecia Sister    Mental retardation Sister        depression   Prostate cancer Paternal Grandfather    Cancer Maternal Uncle    Colon cancer Neg Hx    Esophageal cancer Neg Hx    Rectal cancer Neg Hx    Stomach cancer Neg Hx    Colon polyps Neg Hx     SOCIAL HISTORY: Social History   Socioeconomic History   Marital status: Widowed    Spouse name: Not on file   Number of children: 1   Years of education: Not on file   Highest education level: Not on file  Occupational History   Occupation: disabled    Comment: mental illness; seizures  Tobacco Use   Smoking status: Every Day    Packs/day: 0.50    Years: 44.00    Pack years: 22.00    Types: Cigarettes   Smokeless tobacco: Never  Vaping Use   Vaping Use: Never used  Substance and Sexual Activity   Alcohol use: Yes    Alcohol/week: 0.0 standard drinks    Comment: 1 pakj every 3 days   Drug use: Not Currently    Types: Marijuana, Cocaine    Comment: not used in 5 months    Sexual activity: Not Currently    Birth control/protection: None    Comment: widow  Other Topics Concern   Not on file  Social History Narrative   Marital status:  Widowed since 2002.  Married x 16 years. + dating.  Moved from [place] to live with daughter in 2013.     Children:  One child/daughter (33); two grandchildren.      Lives: with daughter,  grandchildren 2.  Does not drive due to epilepsy.      Employment:  Disability for schizoaffective disorder.      Tobacco: 1 ppd x since 8th grade.      Alcohol:  Social; rare drinking due to seizure medications.  Weekends.       Drugs: none; previous use of marijuana.  Previous iv drug use, cocaine.      Exercise: none      Seatbelt:  100%      Guns: none   Social Determinants of Systems developer Strain: Not on file  Food Insecurity: Not on file  Transportation Needs: Not on file  Physical Activity: Not on file  Stress: Not on file  Social Connections: Not on file  Intimate Partner Violence: Not on file     PHYSICAL EXAM: Vitals:   10/09/21 1131  BP: (!) 167/83  Pulse: 78  Resp: 20  SpO2: 98%   General: No acute distress, patient is restless, moving around frequently on her chair, able to stay still during exam Head:  Normocephalic/atraumatic Skin/Extremities: No rash, no edema Neurological Exam: alert and awake. No aphasia or dysarthria. Fund of knowledge is appropriate.  Attention and concentration are normal.   Cranial nerves: Pupils equal, round. Extraocular movements intact with no nystagmus. Visual fields full.  No facial asymmetry.  Motor: Bulk and tone normal, muscle strength 5/5 throughout with no pronator drift with left arm pain on abduction.  Finger to nose testing intact.  Gait narrow-based and steady. 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 MRI brain and routine EEG unremarkable. She reports her last seizure was 7 weeks ago. We agreed to continue on Depakote 574m TID which she also takes for mood stabilization. She reports memory changes, we discussed different causes of memory change, check TSH and B12. TSH was low in 03/2021, she is not on thyroid medication. Anxiety/depression/mood can also affect cognition. Continue follow-up with Psychiatry. She does not drive Follow-up in 6 months, she knows to call for any changes.   Thank you for allowing me to participate in her care.  Please do not hesitate to call for any questions or concerns.    KEllouise Newer M.D.   CC: RMaximiano Coss NP

## 2021-10-09 NOTE — Patient Instructions (Addendum)
Bloodwork for TSH and B12  2. Continue Depakote 500mg  three times a day  3. Continue follow-up with Psychiatry  4. Follow-up in 6 months, call for any changes   Seizure Precautions: 1. If medication has been prescribed for you to prevent seizures, take it exactly as directed.  Do not stop taking the medicine without talking to your doctor first, even if you have not had a seizure in a long time.   2. Avoid activities in which a seizure would cause danger to yourself or to others.  Don't operate dangerous machinery, swim alone, or climb in high or dangerous places, such as on ladders, roofs, or girders.  Do not drive unless your doctor says you may.  3. If you have any warning that you may have a seizure, lay down in a safe place where you can't hurt yourself.    4.  No driving for 6 months from last seizure, as per [facility]   Please refer to the following link on the [place] website for more information: http://www.epilepsyfoundation.org/answerplace/Social/driving/drivingu.cfm   5.  Maintain good sleep hygiene. Avoid alcohol.  6.  Contact your doctor if you have any problems that may be related to the medicine you are taking.  7.  Call 911 and bring the patient back to the ED if:        A.  The seizure lasts longer than 5 minutes.       B.  The patient doesn't awaken shortly after the seizure  C.  The patient has new problems such as difficulty seeing, speaking or moving  D.  The patient was injured during the seizure  E.  The patient has a temperature over 102 F (39C)  F.  The patient vomited and now is having trouble breathing

## 2021-10-10 ENCOUNTER — Other Ambulatory Visit: Payer: Medicare Other

## 2021-10-10 LAB — VALPROIC ACID LEVEL: Valproic Acid Lvl: <12.5 mg/L — ABNORMAL LOW (ref 50.0–100.0)

## 2021-10-13 ENCOUNTER — Other Ambulatory Visit: Payer: Self-pay | Admitting: Registered Nurse

## 2021-10-13 DIAGNOSIS — F40240 Claustrophobia: Secondary | ICD-10-CM

## 2021-10-13 MED ORDER — CLONAZEPAM 0.5 MG PO TABS: 0.5000 mg | ORAL_TABLET | Freq: Two times a day (BID) | ORAL | 0 refills | Status: DC | PRN

## 2021-10-13 NOTE — Telephone Encounter (Signed)
Medication sent to wrong pharmacy can you resend this to [place], [place]

## 2021-10-13 NOTE — Telephone Encounter (Signed)
Fixed  Thanks,  Denice Paradise

## 2021-10-14 ENCOUNTER — Telehealth (INDEPENDENT_AMBULATORY_CARE_PROVIDER_SITE_OTHER): Payer: Medicare Other | Admitting: Registered Nurse

## 2021-10-14 ENCOUNTER — Encounter (HOSPITAL_COMMUNITY): Payer: Self-pay | Admitting: *Deleted

## 2021-10-14 ENCOUNTER — Other Ambulatory Visit: Payer: Self-pay

## 2021-10-14 ENCOUNTER — Telehealth (HOSPITAL_COMMUNITY): Payer: Self-pay | Admitting: *Deleted

## 2021-10-14 ENCOUNTER — Encounter: Payer: Self-pay | Admitting: Registered Nurse

## 2021-10-14 DIAGNOSIS — R051 Acute cough: Secondary | ICD-10-CM

## 2021-10-14 DIAGNOSIS — J431 Panlobular emphysema: Secondary | ICD-10-CM

## 2021-10-14 DIAGNOSIS — J441 Chronic obstructive pulmonary disease with (acute) exacerbation: Secondary | ICD-10-CM | POA: Diagnosis not present

## 2021-10-14 MED ORDER — BUDESONIDE-FORMOTEROL FUMARATE 160-4.5 MCG/ACT IN AERO: INHALATION_SPRAY | RESPIRATORY_TRACT | 2 refills | Status: DC

## 2021-10-14 MED ORDER — PREDNISONE 10 MG PO TABS: ORAL_TABLET | ORAL | 0 refills | Status: AC

## 2021-10-14 MED ORDER — ALBUTEROL SULFATE HFA 108 (90 BASE) MCG/ACT IN AERS: 1.0000 | INHALATION_SPRAY | Freq: Four times a day (QID) | RESPIRATORY_TRACT | 11 refills | Status: DC | PRN

## 2021-10-14 MED ORDER — DOXYCYCLINE HYCLATE 100 MG PO TABS: 100.0000 mg | ORAL_TABLET | Freq: Two times a day (BID) | ORAL | 0 refills | Status: DC

## 2021-10-14 NOTE — Patient Instructions (Signed)
° ° ° °  If you have lab work done today you will be contacted with your lab results within the next 2 weeks.  If you have not heard from us then please contact us. The fastest way to get your results is to register for My Chart. ° ° °IF you received an x-ray today, you will receive an invoice from [facility]. Please contact [facility] at [phone] with questions or concerns regarding your invoice.  ° °IF you received labwork today, you will receive an invoice from [facility]. Please contact [facility] at [phone] with questions or concerns regarding your invoice.  ° °Our billing staff will not be able to assist you with questions regarding bills from these companies. ° °You will be contacted with the lab results as soon as they are available. The fastest way to get your results is to activate your My Chart account. Instructions are located on the last page of this paperwork. If you have not heard from us regarding the results in 2 weeks, please contact this office. °  ° ° ° °

## 2021-10-14 NOTE — Progress Notes (Signed)
Telemedicine Encounter- SOAP NOTE Established Patient  This telephone encounter was conducted with the patient's (or proxy's) verbal consent via audio telecommunications: yes/no: Yes Patient was instructed to have this encounter in a suitably private space; and to only have persons present to whom they give permission to participate. In addition, patient identity was confirmed by use of name plus two identifiers (DOB and address).  I discussed the limitations, risks, security and privacy concerns of performing an evaluation and management service by telephone and the availability of in person appointments. I also discussed with the patient that there may be a patient responsible charge related to this service. The patient expressed understanding and agreed to proceed.  I spent a total of 22 minutes talking with the patient or their proxy.  Patient at home Provider in office  Participants: Kathrin Ruddy, NP and Merrie Roof  Chief Complaint  Patient presents with   Hypertension    Patient states she has been having some problems breathing problems again and thinks she might have fluid on her lungs. Patient states she is SOB so much she try not to even move .    Subjective   Melissa Bridges is a 60 y.o. established patient. Telephone visit today for follow up   HPI Seen by Dr. Nani Ravens on 09/30/21 for upper respiratory complaints Had been experiencing 5 days of congestion, rhinorrhea, ear pain, sore throat, wheezing, shob, chest tightness, myalgias, sneezing and coughing. Denied ear drainage, fevers, nvd, sensory changes. Had pursued OTC vitamin c, tylenol, robitusson. No known sick contacts at that time.  She was given 5 day prednisone burst of 49m po qd. Was advised to seek ER care if shob, shaking, fevers, or worsening of condition.  Since that time, unfortunately notes that prednisone had not helped symptoms Pressure still high - she does acknowledge that  is  in pain and sick, which is not helping her.  She has been taking mucinex, albuterol, symbicort with some relief.  Chest tightness. Feeling like when she had had PNA in the past. Notes heartrate feels rapid but regular. Occ lightheadedness and gagging with coughing episodes but denies specific doe, chest pain, palpitations. Notes some pain in legs but no claudication or dependent edema. No visual changes.  Of note, had been seen and assessed by Dr. KLyman Bishopon 09/29/21 who had suggested symptoms more likely related to her known hx of COPD, though he did plan to obtain lexiscan myoview to assess coronary artery disease. BP at that visit markedly elevated to 174/104, though she does have a history of pain that was reportedly worse that day, likely increasing this reading. Restarted on amlodipine 538mpo qd. Had tolerated well in the past.   Patient Active Problem List   Diagnosis Date Noted   Claustrophobia 10/06/2021   Aortic atherosclerosis ([facility]/17/2022   Other fatigue 10/22/2020   Dark stools 10/22/2020   Other psychoactive substance dependence, in remission ([facility]11/29/2020   Orthostatic hypotension    Syncope 10/11/2019   Screening examination for STD (sexually transmitted disease) 06/20/2019   Vaginal discharge 06/20/2019   Dysuria 06/20/2019   Bacterial vaginosis 06/20/2019   Hematuria of unknown cause 06/20/2019   Acute non-recurrent sinusitis 03/28/2019   Overdose    Melena    Suicidal ideation 07/09/2018   Anemia 07/09/2018   Cocaine abuse ([facility]06/11/2018   Hypomagnesemia 05/24/2018   Chronic left hip pain 05/24/2018   Hypokalemia 05/23/2018   Tylenol overdose 05/23/2018   Abdominal pain  05/23/2018   Emphysema/COPD ([place]) 05/23/2018   Serrated adenoma of colon 08/05/2016   Localization-related idiopathic epilepsy and epileptic syndromes with seizures of localized onset, not intractable, without status epilepticus ([place]) 10/16/2015   Lumbar disc herniation 08/07/2015    Seizure disorder ([place]) 09/09/2014   Asthma with acute exacerbation 03/09/2013   Hypertension 03/09/2013   Lower back pain 03/09/2013   Chronic hepatitis C without hepatic coma ([place]) 03/08/2013   Smoker 12/12/2012   Schizoaffective disorder, bipolar type ([place]) 12/12/2012    Past Medical History:  Diagnosis Date   Allergy    Alopecia    Anxiety    Arthritis    Asthma    Asthma    [employer] 10/22/2020   Cataract    "mild"   COPD (chronic obstructive pulmonary disease) ([place])    Depression    Depression    [employer] 10/22/2020   GERD (gastroesophageal reflux disease)    o cc- takes OTC if needed.   Glaucoma    Hepatitis C    Hypertension    onset age 69.   Iron deficiency anemia    Migraine    Schizoaffective disorder (HCC)    Sciatic pain    Seizures (HCC)    onset in childhood. 06-03-16- per pt 8 months agoGeneralized tonic-clonic.Last seizure couple of months ago- last sz 9-10 months ago per pt 10-12-2016   Shortness of breath dyspnea    Substance abuse (HCC)    Ulcer     Current Outpatient Medications  Medication Sig Dispense Refill   acetaminophen (TYLENOL) 500 MG tablet Take 2,000-2,500 mg by mouth every 4 (four) hours as needed (pain).     amLODipine (NORVASC) 5 MG tablet Take 1 tablet (5 mg total) by mouth daily. 90 tablet 3   Blood Pressure Monitor KIT Dispense one blood pressure monitor to blood pressure 1 kit 0   citalopram (CELEXA) 40 MG tablet Take 0.5 tablets (20 mg total) by mouth daily. 90 tablet 3   clonazePAM (KLONOPIN) 0.5 MG tablet Take 1 tablet (0.5 mg total) by mouth 2 (two) times daily as needed for anxiety. 3 tablet 0   divalproex (DEPAKOTE) 500 MG DR tablet Take 1 tablet (500 mg total) by mouth 3 (three) times daily. 270 tablet 3   doxycycline (VIBRA-TABS) 100 MG tablet Take 1 tablet (100 mg total) by mouth 2 (two) times daily. 20 tablet 0   ferrous sulfate 325 (65 FE) MG tablet Take by mouth.     folic acid (FOLVITE) 1 MG tablet Take by mouth.      gabapentin (NEURONTIN) 400 MG capsule Take 1 capsule (400 mg total) by mouth 3 (three) times daily. 270 capsule 1   ipratropium-albuterol (DUONEB) 0.5-2.5 (3) MG/3ML SOLN Take 3 mLs by nebulization every 6 (six) hours as needed (shortness of breath). Reported on 12/02/2015 360 mL 1   methocarbamol (ROBAXIN) 500 MG tablet Take 1 tablet by mouth 4 times daily 60 tablet 0   montelukast (SINGULAIR) 10 MG tablet TAKE 1 TABLET BY MOUTH EVERYDAY AT BEDTIME 90 tablet 1   Multiple Vitamin (MULTIVITAMIN WITH MINERALS) TABS tablet Take 1 tablet by mouth daily. 30 tablet 1   pantoprazole (PROTONIX) 20 MG tablet TAKE 1 TABLET BY MOUTH EVERY DAY 90 tablet 0   predniSONE (DELTASONE) 10 MG tablet Take 3 tablets (30 mg total) by mouth daily with breakfast for 3 days, THEN 2 tablets (20 mg total) daily with breakfast for 3 days, THEN 1 tablet (10 mg total) daily with breakfast  for 3 days. 18 tablet 0   QUEtiapine (SEROQUEL XR) 50 MG TB24 24 hr tablet Take 1 tablet (50 mg total) by mouth at bedtime. 30 tablet 3   valACYclovir (VALTREX) 1000 MG tablet TAKE 2 TABLETS BY MOUTH AT SYMPTOM ONSET, REPEAT IN 12 HOURS 20 tablet 2   vitamin C (ASCORBIC ACID) 250 MG tablet Take 250 mg by mouth daily.     albuterol (PROVENTIL HFA) 108 (90 Base) MCG/ACT inhaler Inhale 1-2 puffs into the lungs every 6 (six) hours as needed for wheezing or shortness of breath. 18 g 11   budesonide-formoterol (SYMBICORT) 160-4.5 MCG/ACT inhaler TAKE 2 PUFFS BY MOUTH TWICE A DAY 10.2 each 2   hydrOXYzine (ATARAX/VISTARIL) 10 MG tablet Take 1 tablet (10 mg total) by mouth 3 (three) times daily as needed. (Patient not taking: No sig reported) 30 tablet 5   mirabegron ER (MYRBETRIQ) 50 MG TB24 tablet Take 1 tablet (50 mg total) by mouth daily. (Patient not taking: No sig reported) 90 tablet 1   nystatin (MYCOSTATIN) 100000 UNIT/ML suspension Take 5 mLs (500,000 Units total) by mouth 4 (four) times daily. (Patient not taking: No sig reported) 60 mL 0    ondansetron (ZOFRAN) 4 MG tablet Take 1 tablet (4 mg total) by mouth every 6 (six) hours. (Patient not taking: No sig reported) 12 tablet 0   oxyCODONE-acetaminophen (PERCOCET) 10-325 MG tablet Take 1 tablet by mouth 5 (five) times daily. (Patient not taking: No sig reported)     thiamine 100 MG tablet Take 1 tablet (100 mg total) by mouth daily. (Patient not taking: No sig reported) 90 tablet 1   triamcinolone cream (KENALOG) 0.1 % Apply 1 application topically 2 (two) times daily as needed (rash/irritation). (Patient not taking: No sig reported) 80 g 3   No current facility-administered medications for this visit.    Allergies  Allergen Reactions   Fruit & Vegetable Daily [Nutritional Supplements] Shortness Of Breath    Reaction to Aloe   Iodinated Diagnostic Agents Swelling    Swelling and itching of left side of her face only after CT SI injection(no steroid used)   Ibuprofen Other (See Comments)    Causes Stomach upset; However, pt can take Meloxicam without incident and takes this at home.    Aloe Vera Rash   Aspirin Rash and Other (See Comments)    Stomach upset    Social History   Socioeconomic History   Marital status: Widowed    Spouse name: Not on file   Number of children: 1   Years of education: Not on file   Highest education level: Not on file  Occupational History   Occupation: disabled    Comment: mental illness; seizures  Tobacco Use   Smoking status: Every Day    Packs/day: 0.50    Years: 44.00    Pack years: 22.00    Types: Cigarettes   Smokeless tobacco: Never  Vaping Use   Vaping Use: Never used  Substance and Sexual Activity   Alcohol use: Yes    Alcohol/week: 0.0 standard drinks    Comment: 1 pakj every 3 days   Drug use: Not Currently    Types: Marijuana, Cocaine    Comment: not used in 5 months    Sexual activity: Not Currently    Birth control/protection: None    Comment: widow  Other Topics Concern   Not on file  Social History  Narrative   Marital status:  Widowed since 2002.  Married x  16 years. + dating.  Moved from [place] to live with daughter in 2013.     Children:  One child/daughter (33); two grandchildren.      Lives: with daughter, grandchildren 2.  Does not drive due to epilepsy.      Employment:  Disability for schizoaffective disorder.      Tobacco: 1 ppd x since 8th grade.      Alcohol:  Social; rare drinking due to seizure medications.  Weekends.       Drugs: none; previous use of marijuana.  Previous iv drug use, cocaine.      Exercise: none      Seatbelt:  100%      Guns: none   Social Determinants of Radio broadcast assistant Strain: Not on file  Food Insecurity: Not on file  Transportation Needs: Not on file  Physical Activity: Not on file  Stress: Not on file  Social Connections: Not on file  Intimate Partner Violence: Not on file    ROS Per hpi   Objective   Vitals as reported by the patient: There were no vitals filed for this visit.  Nadea was seen today for hypertension.  Diagnoses and all orders for this visit:  COPD exacerbation ([place]) -     doxycycline (VIBRA-TABS) 100 MG tablet; Take 1 tablet (100 mg total) by mouth 2 (two) times daily. -     predniSONE (DELTASONE) 10 MG tablet; Take 3 tablets (30 mg total) by mouth daily with breakfast for 3 days, THEN 2 tablets (20 mg total) daily with breakfast for 3 days, THEN 1 tablet (10 mg total) daily with breakfast for 3 days.  Panlobular emphysema (HCC) -     budesonide-formoterol (SYMBICORT) 160-4.5 MCG/ACT inhaler; TAKE 2 PUFFS BY MOUTH TWICE A DAY  Cough -     albuterol (PROVENTIL HFA) 108 (90 Base) MCG/ACT inhaler; Inhale 1-2 puffs into the lungs every 6 (six) hours as needed for wheezing or shortness of breath.   PLAN Concern for overlap in cardiopulmonary symptoms but reassured by recent normal ekg with Dr. Debara Pickett. Feeling likely that a viral infection has progressed into bacterial copd exacerbation vs pna -  will treat as above with doxycycline and prednisone taper. Reviewed nonpharm care, risks and benefits of treatment, and reasons to return. Pt will proceed to ER if failing to improve within 48 hours, any worsening of condition, or any new symptoms. She voices understanding of this directive. Patient encouraged to call clinic with any questions, comments, or concerns.  I discussed the assessment and treatment plan with the patient. The patient was provided an opportunity to ask questions and all were answered. The patient agreed with the plan and demonstrated an understanding of the instructions.   The patient was advised to call back or seek an in-person evaluation if the symptoms worsen or if the condition fails to improve as anticipated.  I provided 22 minutes of non-face-to-face time during this encounter.  Maximiano Coss, NP

## 2021-10-14 NOTE — Telephone Encounter (Signed)
Left message on daughter voicemail per DPR in reference to upcoming appointment scheduled on 10/21/21 at 0815 with detailed instructions given per Myocardial Perfusion Study Information Sheet for the test. LM to arrive 15 minutes early, and that it is imperative to arrive on time for appointment to keep from having the test rescheduled. If you need to cancel or reschedule your appointment, please call the office within 24 hours of your appointment. Failure to do so may result in a cancellation of your appointment, and a $50 no show fee. Phone number given for call back for any questions.  [facility] Mychart letter sent

## 2021-10-16 ENCOUNTER — Other Ambulatory Visit: Payer: Self-pay | Admitting: Registered Nurse

## 2021-10-16 DIAGNOSIS — M62838 Other muscle spasm: Secondary | ICD-10-CM

## 2021-10-21 ENCOUNTER — Telehealth (HOSPITAL_COMMUNITY): Payer: Self-pay | Admitting: *Deleted

## 2021-10-21 ENCOUNTER — Ambulatory Visit (HOSPITAL_COMMUNITY): Payer: Medicare Other

## 2021-10-21 NOTE — Telephone Encounter (Signed)
Left message on voicemail per DPR in reference to upcoming appointment scheduled on  10/27/21 with detailed instructions given per Myocardial Perfusion Study Information Sheet for the test. LM to arrive 15 minutes early, and that it is imperative to arrive on time for appointment to keep from having the test rescheduled. If you need to cancel or reschedule your appointment, please call the office within 24 hours of your appointment. Failure to do so may result in a cancellation of your appointment, and a $50 no show fee. Phone number given for call back for any questions. Kirstie Peri

## 2021-10-23 ENCOUNTER — Encounter (HOSPITAL_COMMUNITY): Payer: Medicare Other

## 2021-10-27 ENCOUNTER — Ambulatory Visit (HOSPITAL_COMMUNITY): Payer: Medicare Other | Attending: Internal Medicine

## 2021-10-27 ENCOUNTER — Other Ambulatory Visit: Payer: Self-pay

## 2021-10-27 DIAGNOSIS — R0602 Shortness of breath: Secondary | ICD-10-CM | POA: Diagnosis present

## 2021-10-27 DIAGNOSIS — R0789 Other chest pain: Secondary | ICD-10-CM | POA: Diagnosis present

## 2021-10-27 LAB — MYOCARDIAL PERFUSION IMAGING
LV dias vol: 52 mL (ref 46–106)
LV sys vol: 19 mL
Nuc Stress EF: 62 %
Peak HR: 109 {beats}/min
Rest HR: 64 {beats}/min
Rest Nuclear Isotope Dose: 9.1 mCi
SDS: 2
SRS: 0
SSS: 2
ST Depression (mm): 0 mm
Stress Nuclear Isotope Dose: 28.1 mCi
TID: 1.21

## 2021-10-27 MED ORDER — TECHNETIUM TC 99M TETROFOSMIN IV KIT
28.1000 | PACK | Freq: Once | INTRAVENOUS | Status: AC | PRN
  Administered 2021-10-27: 28.1 via INTRAVENOUS
  Filled 2021-10-27: qty 29

## 2021-10-27 MED ORDER — ADENOSINE (DIAGNOSTIC) 3 MG/ML IV SOLN
0.5600 mg/kg | Freq: Once | INTRAVENOUS | Status: AC
  Administered 2021-10-27: 29.7 mg via INTRAVENOUS

## 2021-10-27 MED ORDER — TECHNETIUM TC 99M TETROFOSMIN IV KIT
9.1000 | PACK | Freq: Once | INTRAVENOUS | Status: AC | PRN
  Administered 2021-10-27: 9.1 via INTRAVENOUS
  Filled 2021-10-27: qty 10

## 2021-11-06 ENCOUNTER — Other Ambulatory Visit: Payer: Self-pay | Admitting: Registered Nurse

## 2021-11-06 DIAGNOSIS — J431 Panlobular emphysema: Secondary | ICD-10-CM

## 2021-11-19 ENCOUNTER — Other Ambulatory Visit: Payer: Self-pay | Admitting: Registered Nurse

## 2021-11-19 DIAGNOSIS — F250 Schizoaffective disorder, bipolar type: Secondary | ICD-10-CM

## 2021-11-22 ENCOUNTER — Other Ambulatory Visit: Payer: Self-pay | Admitting: Registered Nurse

## 2021-11-22 DIAGNOSIS — J431 Panlobular emphysema: Secondary | ICD-10-CM

## 2021-11-22 DIAGNOSIS — M62838 Other muscle spasm: Secondary | ICD-10-CM

## 2021-11-23 NOTE — Telephone Encounter (Signed)
Patient is requesting a refill of the following medications: Requested Prescriptions   Pending Prescriptions Disp Refills   methocarbamol (ROBAXIN) 500 MG tablet [Pharmacy Med Name: Methocarbamol 500 MG Oral Tablet] 60 tablet 0    Sig: Take 1 tablet by mouth 4 times daily   SYMBICORT 160-4.5 MCG/ACT inhaler [Pharmacy Med Name: Symbicort 160-4.5 MCG/ACT Inhalation Aerosol] 11 g 0    Sig: INHALE 2 PUFFS INTO LUNGS TWICE DAILY    Date of patient request: 11/22/2021 Last office visit: 06/03/2021 Date of last refill: 10/18/2021 Last refill amount: 60 tablets Follow up time period per chart: n/a

## 2022-01-21 NOTE — Telephone Encounter (Signed)
Erroneous encounter. Please disregard.

## 2022-03-11 ENCOUNTER — Ambulatory Visit: Payer: Medicare Other | Admitting: Registered Nurse

## 2022-03-22 ENCOUNTER — Other Ambulatory Visit: Payer: Self-pay | Admitting: Registered Nurse

## 2022-03-22 DIAGNOSIS — J431 Panlobular emphysema: Secondary | ICD-10-CM

## 2022-03-22 DIAGNOSIS — F40240 Claustrophobia: Secondary | ICD-10-CM

## 2022-03-22 DIAGNOSIS — M62838 Other muscle spasm: Secondary | ICD-10-CM

## 2022-03-22 NOTE — Telephone Encounter (Signed)
Patient is requesting a refill of the following medications: ?Requested Prescriptions  ? ?Pending Prescriptions Disp Refills  ? methocarbamol (ROBAXIN) 500 MG tablet [Pharmacy Med Name: Methocarbamol 500 MG Oral Tablet] 60 tablet 0  ?  Sig: Take 1 tablet by mouth 4 times daily  ? SYMBICORT 160-4.5 MCG/ACT inhaler [Pharmacy Med Name: Symbicort 160-4.5 MCG/ACT Inhalation Aerosol] 11 g 0  ?  Sig: INHALE 2 PUFFS INTO LUNGS TWICE DAILY  ? clonazePAM (KLONOPIN) 0.5 MG tablet [Pharmacy Med Name: clonazePAM 0.5 MG Oral Tablet] 3 tablet 0  ?  Sig: Take 1 tablet by mouth twice daily as needed for anxiety  ? ? ?Date of patient request: 03/22/22 ?Last office visit: 10/14/21 ?Date of last refill: 11/23/21, 10/13/21 ?Last refill amount: 60, 3 ? ? ?

## 2022-03-25 ENCOUNTER — Other Ambulatory Visit: Payer: Self-pay

## 2022-03-25 ENCOUNTER — Encounter: Payer: Self-pay | Admitting: Registered Nurse

## 2022-03-25 ENCOUNTER — Ambulatory Visit (INDEPENDENT_AMBULATORY_CARE_PROVIDER_SITE_OTHER): Payer: Medicare Other | Admitting: Registered Nurse

## 2022-03-25 VITALS — BP 134/72 | HR 60 | Temp 98.0°F | Resp 18 | Ht 65.0 in | Wt 114.4 lb

## 2022-03-25 DIAGNOSIS — I10 Essential (primary) hypertension: Secondary | ICD-10-CM

## 2022-03-25 DIAGNOSIS — M5442 Lumbago with sciatica, left side: Secondary | ICD-10-CM

## 2022-03-25 DIAGNOSIS — G8929 Other chronic pain: Secondary | ICD-10-CM

## 2022-03-25 DIAGNOSIS — Z0289 Encounter for other administrative examinations: Secondary | ICD-10-CM | POA: Diagnosis not present

## 2022-03-25 DIAGNOSIS — R0989 Other specified symptoms and signs involving the circulatory and respiratory systems: Secondary | ICD-10-CM | POA: Diagnosis not present

## 2022-03-25 DIAGNOSIS — M5441 Lumbago with sciatica, right side: Secondary | ICD-10-CM

## 2022-03-25 LAB — COMPREHENSIVE METABOLIC PANEL
ALT: 43 U/L — ABNORMAL HIGH (ref 0–35)
AST: 65 U/L — ABNORMAL HIGH (ref 0–37)
Albumin: 4.3 g/dL (ref 3.5–5.2)
Alkaline Phosphatase: 95 U/L (ref 39–117)
BUN: 6 mg/dL (ref 6–23)
CO2: 26 mEq/L (ref 19–32)
Calcium: 9.8 mg/dL (ref 8.4–10.5)
Chloride: 99 mEq/L (ref 96–112)
Creatinine, Ser: 0.72 mg/dL (ref 0.40–1.20)
GFR: 89.19 mL/min (ref >60.00)
Glucose, Bld: 93 mg/dL (ref 70–99)
Potassium: 3.6 mEq/L (ref 3.5–5.1)
Sodium: 133 mEq/L — ABNORMAL LOW (ref 135–145)
Total Bilirubin: 0.5 mg/dL (ref 0.2–1.2)
Total Protein: 7.5 g/dL (ref 6.0–8.3)

## 2022-03-25 LAB — CBC WITH DIFFERENTIAL/PLATELET
Basophils Absolute: 0.1 10*3/uL (ref 0.0–0.1)
Basophils Relative: 1.1 % (ref 0.0–3.0)
Eosinophils Absolute: 0 10*3/uL (ref 0.0–0.7)
Eosinophils Relative: 0.5 % (ref 0.0–5.0)
HCT: 39.8 % (ref 36.0–46.0)
Hemoglobin: 13.7 g/dL (ref 12.0–15.0)
Lymphocytes Relative: 22.4 % (ref 12.0–46.0)
Lymphs Abs: 1.4 10*3/uL (ref 0.7–4.0)
MCHC: 34.5 g/dL (ref 30.0–36.0)
MCV: 106.8 fl — ABNORMAL HIGH (ref 78.0–100.0)
Monocytes Absolute: 0.7 10*3/uL (ref 0.1–1.0)
Monocytes Relative: 10.8 % (ref 3.0–12.0)
Neutro Abs: 4.1 10*3/uL (ref 1.4–7.7)
Neutrophils Relative %: 65.2 % (ref 43.0–77.0)
Platelets: 175 10*3/uL (ref 150.0–400.0)
RBC: 3.72 Mil/uL — ABNORMAL LOW (ref 3.87–5.11)
RDW: 12.8 % (ref 11.5–15.5)
WBC: 6.2 10*3/uL (ref 4.0–10.5)

## 2022-03-25 LAB — LIPID PANEL
Cholesterol: 175 mg/dL (ref 0–200)
HDL: 65.4 mg/dL (ref >39.00)
LDL Cholesterol: 95 mg/dL (ref 0–99)
NonHDL: 109.71
Total CHOL/HDL Ratio: 3
Triglycerides: 73 mg/dL (ref 0.0–149.0)
VLDL: 14.6 mg/dL (ref 0.0–40.0)

## 2022-03-25 LAB — HEMOGLOBIN A1C: Hgb A1c MFr Bld: 5.1 % (ref 4.6–6.5)

## 2022-03-25 LAB — TSH: TSH: 0.34 u[IU]/mL — ABNORMAL LOW (ref 0.35–5.50)

## 2022-03-25 MED ORDER — PREDNISONE 10 MG PO TABS: ORAL_TABLET | ORAL | 0 refills | Status: AC

## 2022-03-25 MED ORDER — OXYCODONE HCL 5 MG PO CAPS: 5.0000 mg | ORAL_CAPSULE | Freq: Four times a day (QID) | ORAL | 0 refills | Status: DC | PRN

## 2022-03-25 MED ORDER — DOXYCYCLINE HYCLATE 100 MG PO TABS: 100.0000 mg | ORAL_TABLET | Freq: Two times a day (BID) | ORAL | 0 refills | Status: DC

## 2022-03-25 NOTE — Progress Notes (Signed)
Acute Office Visit  Subjective:    Patient ID: Melissa Bridges, female    DOB: 01-Mar-1961, 61 y.o.   MRN: [id number]  Chief Complaint  Patient presents with   Sciatica    Patient states she is having some pain on left side of her body and thinks it her sciatic. Patient states she would like to discuss referrals.    HPI Patient is in today for sciatic pain and paperwork  Sciatic pain Pain has been same as previous. Notes more instability due to pain. Has been taking tylenol, gabapentin, robaxin with limited relief.  Can't take NSAIDs.   Chest congestion Worsening over past few week or two Chest congestion, sinus congestion, coughing productive with mucus.  No shob, doe, chest pain, headaches, visual changes.  Outpatient Medications Prior to Visit  Medication Sig Dispense Refill   acetaminophen (TYLENOL) 500 MG tablet Take 2,000-2,500 mg by mouth every 4 (four) hours as needed (pain).     albuterol (PROVENTIL HFA) 108 (90 Base) MCG/ACT inhaler Inhale 1-2 puffs into the lungs every 6 (six) hours as needed for wheezing or shortness of breath. 18 g 11   Blood Pressure Monitor KIT Dispense one blood pressure monitor to blood pressure 1 kit 0   citalopram (CELEXA) 40 MG tablet Take 0.5 tablets (20 mg total) by mouth daily. 90 tablet 3   clonazePAM (KLONOPIN) 0.5 MG tablet Take 1 tablet by mouth twice daily as needed for anxiety 3 tablet 0   divalproex (DEPAKOTE) 500 MG DR tablet Take 1 tablet (500 mg total) by mouth 3 (three) times daily. 270 tablet 3   doxycycline (VIBRA-TABS) 100 MG tablet Take 1 tablet (100 mg total) by mouth 2 (two) times daily. 20 tablet 0   ferrous sulfate 325 (65 FE) MG tablet Take by mouth.     folic acid (FOLVITE) 1 MG tablet Take by mouth.     gabapentin (NEURONTIN) 400 MG capsule Take 1 capsule (400 mg total) by mouth 3 (three) times daily. 270 capsule 1   ipratropium-albuterol (DUONEB) 0.5-2.5 (3) MG/3ML SOLN Take 3 mLs by nebulization every 6  (six) hours as needed (shortness of breath). Reported on 12/02/2015 360 mL 1   methocarbamol (ROBAXIN) 500 MG tablet Take 1 tablet by mouth 4 times daily 60 tablet 0   mirabegron ER (MYRBETRIQ) 50 MG TB24 tablet Take 1 tablet (50 mg total) by mouth daily. 90 tablet 1   montelukast (SINGULAIR) 10 MG tablet TAKE 1 TABLET BY MOUTH EVERYDAY AT BEDTIME 90 tablet 1   Multiple Vitamin (MULTIVITAMIN WITH MINERALS) TABS tablet Take 1 tablet by mouth daily. 30 tablet 1   nystatin (MYCOSTATIN) 100000 UNIT/ML suspension Take 5 mLs (500,000 Units total) by mouth 4 (four) times daily. 60 mL 0   ondansetron (ZOFRAN) 4 MG tablet Take 1 tablet (4 mg total) by mouth every 6 (six) hours. 12 tablet 0   pantoprazole (PROTONIX) 20 MG tablet TAKE 1 TABLET BY MOUTH EVERY DAY 90 tablet 0   QUEtiapine (SEROQUEL XR) 50 MG TB24 24 hr tablet TAKE 1 TABLET BY MOUTH AT BEDTIME 30 tablet 0   thiamine 100 MG tablet Take 1 tablet (100 mg total) by mouth daily. 90 tablet 1   triamcinolone cream (KENALOG) 0.1 % Apply 1 application topically 2 (two) times daily as needed (rash/irritation). 80 g 3   vitamin C (ASCORBIC ACID) 250 MG tablet Take 250 mg by mouth daily.     oxyCODONE-acetaminophen (PERCOCET) 10-325 MG tablet Take 1 tablet  by mouth 5 (five) times daily.     SYMBICORT 160-4.5 MCG/ACT inhaler INHALE 2 PUFFS INTO LUNGS TWICE DAILY 11 g 0   valACYclovir (VALTREX) 1000 MG tablet TAKE 2 TABLETS BY MOUTH AT SYMPTOM ONSET, REPEAT IN 12 HOURS 20 tablet 2   amLODipine (NORVASC) 5 MG tablet Take 1 tablet (5 mg total) by mouth daily. 90 tablet 3   hydrOXYzine (ATARAX/VISTARIL) 10 MG tablet Take 1 tablet (10 mg total) by mouth 3 (three) times daily as needed. (Patient not taking: Reported on 10/09/2021) 30 tablet 5   No facility-administered medications prior to visit.    Review of Systems  Constitutional: Negative.   HENT: Negative.    Eyes: Negative.   Respiratory:  Positive for cough.   Cardiovascular: Negative.    Gastrointestinal: Negative.   Genitourinary: Negative.   Musculoskeletal: Negative.   Skin: Negative.   Neurological: Negative.   Psychiatric/Behavioral: Negative.    All other systems reviewed and are negative.     Objective:    BP 134/72   Pulse 60   Temp 98 F (36.7 C) (Temporal)   Resp 18   Ht 5' 5"  (1.651 m)   Wt 114 lb 6.4 oz (51.9 kg)   SpO2 99%   BMI 19.04 kg/m  Physical Exam Vitals and nursing note reviewed.  Constitutional:      General: She is not in acute distress.    Appearance: Normal appearance. She is normal weight. She is not ill-appearing, toxic-appearing or diaphoretic.  Cardiovascular:     Rate and Rhythm: Normal rate and regular rhythm.     Heart sounds: Normal heart sounds. No murmur heard.   No friction rub. No gallop.  Pulmonary:     Effort: Pulmonary effort is normal. No respiratory distress.     Breath sounds: No stridor. Wheezing present. No rhonchi or rales.  Chest:     Chest wall: No tenderness.  Musculoskeletal:        General: Tenderness (lower back bilat) present. No swelling or deformity. Normal range of motion.  Skin:    General: Skin is warm and dry.  Neurological:     General: No focal deficit present.     Mental Status: She is alert and oriented to person, place, and time. Mental status is at baseline.  Psychiatric:        Mood and Affect: Mood normal.        Behavior: Behavior normal.        Thought Content: Thought content normal.        Judgment: Judgment normal.    Results for orders placed or performed in visit on 03/25/22  Hemoglobin A1c  Result Value Ref Range   Hgb A1c MFr Bld 5.1 4.6 - 6.5 %  Comprehensive metabolic panel  Result Value Ref Range   Sodium 133 (L) 135 - 145 mEq/L   Potassium 3.6 3.5 - 5.1 mEq/L   Chloride 99 96 - 112 mEq/L   CO2 26 19 - 32 mEq/L   Glucose, Bld 93 70 - 99 mg/dL   BUN 6 6 - 23 mg/dL   Creatinine, Ser 0.72 0.40 - 1.20 mg/dL   Total Bilirubin 0.5 0.2 - 1.2 mg/dL   Alkaline  Phosphatase 95 39 - 117 U/L   AST 65 (H) 0 - 37 U/L   ALT 43 (H) 0 - 35 U/L   Total Protein 7.5 6.0 - 8.3 g/dL   Albumin 4.3 3.5 - 5.2 g/dL   GFR 89.19 >60.00 mL/min  Calcium 9.8 8.4 - 10.5 mg/dL  CBC with Differential/Platelet  Result Value Ref Range   WBC 6.2 4.0 - 10.5 K/uL   RBC 3.72 (L) 3.87 - 5.11 Mil/uL   Hemoglobin 13.7 12.0 - 15.0 g/dL   HCT 39.8 36.0 - 46.0 %   MCV 106.8 (H) 78.0 - 100.0 fl   MCHC 34.5 30.0 - 36.0 g/dL   RDW 12.8 11.5 - 15.5 %   Platelets 175.0 150.0 - 400.0 K/uL   Neutrophils Relative % 65.2 43.0 - 77.0 %   Lymphocytes Relative 22.4 12.0 - 46.0 %   Monocytes Relative 10.8 3.0 - 12.0 %   Eosinophils Relative 0.5 0.0 - 5.0 %   Basophils Relative 1.1 0.0 - 3.0 %   Neutro Abs 4.1 1.4 - 7.7 K/uL   Lymphs Abs 1.4 0.7 - 4.0 K/uL   Monocytes Absolute 0.7 0.1 - 1.0 K/uL   Eosinophils Absolute 0.0 0.0 - 0.7 K/uL   Basophils Absolute 0.1 0.0 - 0.1 K/uL  Lipid panel  Result Value Ref Range   Cholesterol 175 0 - 200 mg/dL   Triglycerides 73.0 0.0 - 149.0 mg/dL   HDL 65.40 >39.00 mg/dL   VLDL 14.6 0.0 - 40.0 mg/dL   LDL Cholesterol 95 0 - 99 mg/dL   Total CHOL/HDL Ratio 3    NonHDL 109.71   TSH  Result Value Ref Range   TSH 0.34 (L) 0.35 - 5.50 uIU/mL        Assessment & Plan:  1. Chronic bilateral low back pain with bilateral sciatica - predniSONE (DELTASONE) 10 MG tablet; Take 4 tablets (40 mg total) by mouth daily with breakfast for 2 days, THEN 3 tablets (30 mg total) daily with breakfast for 2 days, THEN 2 tablets (20 mg total) daily with breakfast for 2 days, THEN 1 tablet (10 mg total) daily with breakfast for 2 days.  Dispense: 20 tablet; Refill: 0 - Hemoglobin A1c - Comprehensive metabolic panel - CBC with Differential/Platelet - Lipid panel - TSH  2. Chest congestion - doxycycline (VIBRA-TABS) 100 MG tablet; Take 1 tablet (100 mg total) by mouth 2 (two) times daily.  Dispense: 20 tablet; Refill: 0 - Hemoglobin A1c - Comprehensive  metabolic panel - CBC with Differential/Platelet - Lipid panel - TSH  3. Encounter for completion of form with patient - Hemoglobin A1c - Comprehensive metabolic panel - CBC with Differential/Platelet - Lipid panel - TSH  4. Primary hypertension - Hemoglobin A1c - Comprehensive metabolic panel - CBC with Differential/Platelet - Lipid panel - TSH    Meds ordered this encounter  Medications   predniSONE (DELTASONE) 10 MG tablet    Sig: Take 4 tablets (40 mg total) by mouth daily with breakfast for 2 days, THEN 3 tablets (30 mg total) daily with breakfast for 2 days, THEN 2 tablets (20 mg total) daily with breakfast for 2 days, THEN 1 tablet (10 mg total) daily with breakfast for 2 days.    Dispense:  20 tablet    Refill:  0    Order Specific Question:   Supervising Provider    Answer:   Carlota Raspberry, JEFFREY R [[id number]]   DISCONTD: oxycodone (OXY-IR) 5 MG capsule    Sig: Take 1 capsule (5 mg total) by mouth every 6 (six) hours as needed.    Dispense:  30 capsule    Refill:  0    Order Specific Question:   Supervising Provider    Answer:   Carlota Raspberry, JEFFREY R [[id number]]   doxycycline (  VIBRA-TABS) 100 MG tablet    Sig: Take 1 tablet (100 mg total) by mouth 2 (two) times daily.    Dispense:  20 tablet    Refill:  0    Order Specific Question:   Supervising Provider    Answer:   Carlota Raspberry, JEFFREY R [[id number]]    Return in about 6 months (around 09/24/2022) for Chronic Conditions.  PLAN Doxycycline and prednisone for respiratory infection Enc deep breathing Patient encouraged to call clinic with any questions, comments, or concerns.  Maximiano Coss, NP

## 2022-03-25 NOTE — Patient Instructions (Addendum)
Ms. Deutscher - ? ?Great to see you! ? ?Doxycycline, prednisone, and oxycodone ? ?If symptoms worsen or fail to improve, call me ? ?See you in a few months ? ?Thanks, ? ?Rich  ? ? ? ?If you have lab work done today you will be contacted with your lab results within the next 2 weeks.  If you have not heard from [place] then please contact us. The fastest way to get your results is to register for My Chart. ? ? ?IF you received an x-ray today, you will receive an invoice from [facility]. Please contact [facility] at [phone] with questions or concerns regarding your invoice.  ? ?IF you received labwork today, you will receive an invoice from [facility]. Please contact [facility] at [phone] with questions or concerns regarding your invoice.  ? ?Our billing staff will not be able to assist you with questions regarding bills from these companies. ? ?You will be contacted with the lab results as soon as they are available. The fastest way to get your results is to activate your My Chart account. Instructions are located on the last page of this paperwork. If you have not heard from [place] regarding the results in 2 weeks, please contact this office. ?  ? ? ?

## 2022-03-31 ENCOUNTER — Telehealth (INDEPENDENT_AMBULATORY_CARE_PROVIDER_SITE_OTHER): Payer: TRICARE For Life (TFL) | Admitting: Registered Nurse

## 2022-03-31 ENCOUNTER — Telehealth: Payer: Self-pay | Admitting: Registered Nurse

## 2022-03-31 DIAGNOSIS — Z5321 Procedure and treatment not carried out due to patient leaving prior to being seen by health care provider: Secondary | ICD-10-CM

## 2022-03-31 NOTE — Telephone Encounter (Signed)
[facility] called about paper work for this patient. I did inform them that Delfino Lovett has been out of the office and I would check in with his assistant regarding this paper work.  Please give the [facility] a call back at the number below to update. Fax listed to send form ? ?F: [phone] or [phone] ?P: [phone] ext [phone] ?

## 2022-04-01 NOTE — Telephone Encounter (Signed)
Do you still have this paperwork by any chance or was it sent with the patient at the last appointment. ?

## 2022-04-02 ENCOUNTER — Telehealth: Payer: Self-pay

## 2022-04-02 DIAGNOSIS — G8929 Other chronic pain: Secondary | ICD-10-CM

## 2022-04-02 NOTE — Telephone Encounter (Signed)
MEDICATION:oxyCODONE-acetaminophen (PERCOCET) 10-325 MG tablet ? ?PHARMACY: [facility], [place] ? ?Comments: Has three pills left.  ? ?**Let patient know to contact pharmacy at the end of the day to make sure medication is ready. ** ? ?** Please notify patient to allow 48-72 hours to process** ? ?**Encourage patient to contact the pharmacy for refills or they can request refills through [facility]** ?  ?

## 2022-04-02 NOTE — Telephone Encounter (Signed)
Patient is requesting a refill of the following medications: ?Requested Prescriptions  ? ?Pending Prescriptions Disp Refills  ? oxyCODONE-acetaminophen (PERCOCET) 10-325 MG tablet 30 tablet   ?  Sig: Take 1 tablet by mouth 5 (five) times daily.  ? ? ?Date of patient request: 04/02/22 ?Last office visit: 03/25/22 ?Date of last refill: 03/12/21 ?Last refill amount: 30 ? ? ?Pt did have Oxycodone 5 mg filled 03/25/22 but this 10-325 was listed on request  ? ?

## 2022-04-02 NOTE — Telephone Encounter (Addendum)
[facility] called back in asking about the paperwork. Advised that Melissa Bridges was out sick and is only here today for a half day today so there has been a delay with paperwork. [facility] verbalized understanding and said that it is time sensitive.  ?

## 2022-04-03 NOTE — Telephone Encounter (Signed)
I have returned this to you ? ?Thanks, ? ?Rich

## 2022-04-05 MED ORDER — OXYCODONE-ACETAMINOPHEN 10-325 MG PO TABS: 1.0000 | ORAL_TABLET | Freq: Three times a day (TID) | ORAL | 0 refills | Status: DC | PRN

## 2022-04-05 NOTE — Telephone Encounter (Signed)
Pt called in asking about this refill please advise  ? ? ? ?

## 2022-04-05 NOTE — Telephone Encounter (Signed)
I have sent a refill but she should follow up with her orthopedic doc for her back as soon as she can ? ?Thanks, ? ?Rich

## 2022-04-05 NOTE — Addendum Note (Signed)
Addended by: Maximiano Coss on: 04/05/2022 07:04 PM ? ? Modules accepted: Orders ? ?

## 2022-04-06 NOTE — Telephone Encounter (Signed)
Attempted to call patient. Melissa Bridges

## 2022-04-12 ENCOUNTER — Ambulatory Visit: Payer: Medicare Other | Admitting: Neurology

## 2022-04-19 ENCOUNTER — Other Ambulatory Visit: Payer: Self-pay | Admitting: Registered Nurse

## 2022-04-19 DIAGNOSIS — B001 Herpesviral vesicular dermatitis: Secondary | ICD-10-CM

## 2022-04-19 DIAGNOSIS — J431 Panlobular emphysema: Secondary | ICD-10-CM

## 2022-04-23 ENCOUNTER — Other Ambulatory Visit: Payer: Self-pay

## 2022-04-23 DIAGNOSIS — G8929 Other chronic pain: Secondary | ICD-10-CM

## 2022-04-23 MED ORDER — OXYCODONE-ACETAMINOPHEN 10-325 MG PO TABS: 1.0000 | ORAL_TABLET | Freq: Three times a day (TID) | ORAL | 0 refills | Status: DC | PRN

## 2022-04-23 NOTE — Telephone Encounter (Signed)
Patient is requesting a refill of the following medications: ?Requested Prescriptions  ? ?Pending Prescriptions Disp Refills  ? oxyCODONE-acetaminophen (PERCOCET) 10-325 MG tablet 30 tablet 0  ?  Sig: Take 1 tablet by mouth every 8 (eight) hours as needed for pain.  ? ? ?Date of patient request: 04/23/22 ?Last office visit: 03/25/22 ?Date of last refill: 04/04/22 ?Last refill amount: 30 ? ? ?

## 2022-05-03 NOTE — Progress Notes (Signed)
Pt lwbs 

## 2022-05-03 NOTE — Assessment & Plan Note (Signed)
Well-controlled.  Continue current regimen. 

## 2022-05-03 NOTE — Assessment & Plan Note (Signed)
Acute on chronic. Will give supply of oxycodone. Reviewed risks, benefits, and side effects, pt voices understanding.

## 2022-05-19 ENCOUNTER — Other Ambulatory Visit: Payer: Self-pay | Admitting: Registered Nurse

## 2022-05-19 DIAGNOSIS — G8929 Other chronic pain: Secondary | ICD-10-CM

## 2022-05-19 DIAGNOSIS — M62838 Other muscle spasm: Secondary | ICD-10-CM

## 2022-05-19 DIAGNOSIS — B001 Herpesviral vesicular dermatitis: Secondary | ICD-10-CM

## 2022-05-19 DIAGNOSIS — J431 Panlobular emphysema: Secondary | ICD-10-CM

## 2022-05-19 NOTE — Telephone Encounter (Signed)
PT stating that pharmacy needs a refill request for her medications Methocarbamol 500 mg, Oxycodone-acetaminophen 10-325 mg, Valacyclovir 1000 mg and Symbicort 160-4.50 mcg. Pt wants her medication requests to [facility] sent to [facility] [address]

## 2022-05-19 NOTE — Telephone Encounter (Signed)
Patient is requesting a refill of the following medications: Requested Prescriptions   Pending Prescriptions Disp Refills   methocarbamol (ROBAXIN) 500 MG tablet 60 tablet 0    Sig: Take 1 tablet (500 mg total) by mouth 4 (four) times daily.   oxyCODONE-acetaminophen (PERCOCET) 10-325 MG tablet 30 tablet 0    Sig: Take 1 tablet by mouth every 8 (eight) hours as needed for pain.   valACYclovir (VALTREX) 1000 MG tablet 20 tablet 0    Sig: TAKE 2 TABLETS BY MOUTH AT SYMPTOM ONSET, REPEAT IN 12 HOURS   SYMBICORT 160-4.5 MCG/ACT inhaler 11 g 0    Sig: Inhale 2 puffs by mouth twice daily    Date of patient request: 05/19/22 Last office visit: 03/25/22 Date of last refill: 03/23/22, 04/23/22, 04/19/22 Last refill amount: 60, 30, 20

## 2022-05-20 ENCOUNTER — Other Ambulatory Visit: Payer: Self-pay | Admitting: Registered Nurse

## 2022-05-20 DIAGNOSIS — J431 Panlobular emphysema: Secondary | ICD-10-CM

## 2022-05-20 DIAGNOSIS — M62838 Other muscle spasm: Secondary | ICD-10-CM

## 2022-05-20 DIAGNOSIS — B001 Herpesviral vesicular dermatitis: Secondary | ICD-10-CM

## 2022-05-20 MED ORDER — SYMBICORT 160-4.5 MCG/ACT IN AERO: INHALATION_SPRAY | RESPIRATORY_TRACT | 0 refills | Status: DC

## 2022-05-20 MED ORDER — OXYCODONE-ACETAMINOPHEN 10-325 MG PO TABS: 1.0000 | ORAL_TABLET | Freq: Three times a day (TID) | ORAL | 0 refills | Status: DC | PRN

## 2022-05-20 MED ORDER — VALACYCLOVIR HCL 1 G PO TABS: ORAL_TABLET | ORAL | 0 refills | Status: DC

## 2022-05-20 MED ORDER — METHOCARBAMOL 500 MG PO TABS: 500.0000 mg | ORAL_TABLET | Freq: Four times a day (QID) | ORAL | 0 refills | Status: DC

## 2022-05-21 ENCOUNTER — Telehealth: Payer: Self-pay | Admitting: Registered Nurse

## 2022-05-21 NOTE — Telephone Encounter (Signed)
PT need refills on symbicort inh, percocet 10-325 mg, methocarbamol 500 mg, and valtrex 1000 mg.

## 2022-05-21 NOTE — Telephone Encounter (Signed)
Called patient and made her aware.

## 2022-06-04 ENCOUNTER — Ambulatory Visit: Payer: Medicare Other | Admitting: Neurology

## 2022-06-08 ENCOUNTER — Telehealth: Payer: Self-pay

## 2022-06-09 ENCOUNTER — Other Ambulatory Visit: Payer: Self-pay | Admitting: Registered Nurse

## 2022-06-09 DIAGNOSIS — G8929 Other chronic pain: Secondary | ICD-10-CM

## 2022-06-09 MED ORDER — OXYCODONE-ACETAMINOPHEN 10-325 MG PO TABS: 1.0000 | ORAL_TABLET | Freq: Three times a day (TID) | ORAL | 0 refills | Status: DC | PRN

## 2022-06-09 NOTE — Telephone Encounter (Signed)
Refill sent  Thanks,  Rich

## 2022-06-20 ENCOUNTER — Other Ambulatory Visit: Payer: Self-pay | Admitting: Registered Nurse

## 2022-06-20 DIAGNOSIS — J431 Panlobular emphysema: Secondary | ICD-10-CM

## 2022-06-22 ENCOUNTER — Other Ambulatory Visit: Payer: Self-pay | Admitting: Registered Nurse

## 2022-06-22 DIAGNOSIS — R051 Acute cough: Secondary | ICD-10-CM

## 2022-06-22 DIAGNOSIS — G8929 Other chronic pain: Secondary | ICD-10-CM

## 2022-06-22 NOTE — Telephone Encounter (Signed)
Patient is requesting a refill of the following medications: Requested Prescriptions   Pending Prescriptions Disp Refills   albuterol (PROVENTIL HFA) 108 (90 Base) MCG/ACT inhaler 18 g 11    Sig: Inhale 1-2 puffs into the lungs every 6 (six) hours as needed for wheezing or shortness of breath.   oxyCODONE-acetaminophen (PERCOCET) 10-325 MG tablet 30 tablet 0    Sig: Take 1 tablet by mouth every 8 (eight) hours as needed for pain.    Date of patient request: 06/22/22 Last office visit: 03/25/22 Date of last refill: 10/14/21, 06/09/22 Last refill amount: 30 tab

## 2022-06-22 NOTE — Telephone Encounter (Signed)
Encourage patient to contact the pharmacy for refills or they can request refills through [facility]  (Please schedule appointment if patient has not been seen in over a year)    WHAT PHARMACY WOULD THEY LIKE THIS SENT TO: [facility] [phone]  MEDICATION NAME & DOSE: albuterol 108 mcg/act inhaler, oxycodone -acetaminophen 10-325 mg  NOTES/COMMENTS FROM PATIENT:      [place] office please notify patient: It takes 48-72 hours to process rx refill requests Ask patient to call pharmacy to ensure rx is ready before heading there.

## 2022-06-24 ENCOUNTER — Other Ambulatory Visit: Payer: Self-pay

## 2022-06-24 ENCOUNTER — Telehealth: Payer: Self-pay | Admitting: Registered Nurse

## 2022-06-24 DIAGNOSIS — R051 Acute cough: Secondary | ICD-10-CM

## 2022-06-24 DIAGNOSIS — G8929 Other chronic pain: Secondary | ICD-10-CM

## 2022-06-24 MED ORDER — ALBUTEROL SULFATE HFA 108 (90 BASE) MCG/ACT IN AERS: 1.0000 | INHALATION_SPRAY | Freq: Four times a day (QID) | RESPIRATORY_TRACT | 11 refills | Status: DC | PRN

## 2022-06-24 NOTE — Telephone Encounter (Signed)
Patient is requesting a refill of the following medications: Requested Prescriptions   Pending Prescriptions Disp Refills   oxyCODONE-acetaminophen (PERCOCET) 10-325 MG tablet 30 tablet 0    Sig: Take 1 tablet by mouth every 8 (eight) hours as needed for pain.    Date of patient request: 06/24/2022 Last office visit: 03/25/2022 Date of last refill: 06/09/2022 Last refill amount: 30 Follow up time period per chart: 07/12/2022   Patient needs albuterol due to recall

## 2022-06-24 NOTE — Telephone Encounter (Signed)
Looks like Rx was sent in today

## 2022-06-24 NOTE — Telephone Encounter (Signed)
Caren Griffins from [facility] called inquiring about refill for albuterol 108 mcg/act inhaler. Please fax to her attention at: [phone]

## 2022-06-25 ENCOUNTER — Telehealth: Payer: Self-pay | Admitting: Registered Nurse

## 2022-06-25 ENCOUNTER — Other Ambulatory Visit: Payer: Self-pay

## 2022-06-25 DIAGNOSIS — R051 Acute cough: Secondary | ICD-10-CM

## 2022-06-25 MED ORDER — ALBUTEROL SULFATE HFA 108 (90 BASE) MCG/ACT IN AERS: 1.0000 | INHALATION_SPRAY | Freq: Four times a day (QID) | RESPIRATORY_TRACT | 11 refills | Status: DC | PRN

## 2022-06-25 NOTE — Telephone Encounter (Signed)
Pt next appt is 07/12/22. Pt called stating that she took her last percocet 10 mg. Pt want to know if you can get partial refill on that medication until her next appt on 07/12/22. Pt [facility] [address] [facility] and she would like to pick her percocet at the same time.

## 2022-07-01 ENCOUNTER — Telehealth: Payer: Self-pay | Admitting: Registered Nurse

## 2022-07-01 ENCOUNTER — Other Ambulatory Visit: Payer: Self-pay

## 2022-07-01 DIAGNOSIS — G8929 Other chronic pain: Secondary | ICD-10-CM

## 2022-07-01 NOTE — Telephone Encounter (Signed)
I cannot refill this at this time. She needs to follow up with her orthopedic provider in regards to her pain  Thanks,  Rich

## 2022-07-01 NOTE — Telephone Encounter (Signed)
Pt has appt on 07/12/22. Pt want to know if she can get some pain medication until her appt on 07/12/22. Pt has 0 refills on her Percocets 10-325 mg. Pt will like to pick up medication at [facility]. Please call pt at [phone].

## 2022-07-01 NOTE — Telephone Encounter (Signed)
Spoke with pt and advised of note from [employer].

## 2022-07-01 NOTE — Telephone Encounter (Signed)
This concern has been previously addressed by myself and/or another provider.  If they patient has ongoing concerns, they can contact me at their convenience.  Thank you,  Rich Aleza Pew, NP 

## 2022-07-01 NOTE — Telephone Encounter (Signed)
knno

## 2022-07-12 ENCOUNTER — Ambulatory Visit (INDEPENDENT_AMBULATORY_CARE_PROVIDER_SITE_OTHER): Payer: Medicare Other | Admitting: Registered Nurse

## 2022-07-12 ENCOUNTER — Encounter: Payer: Self-pay | Admitting: Registered Nurse

## 2022-07-12 ENCOUNTER — Other Ambulatory Visit: Payer: Self-pay

## 2022-07-12 VITALS — BP 152/82 | HR 80 | Temp 98.3°F | Resp 18 | Ht 65.0 in | Wt 105.0 lb

## 2022-07-12 DIAGNOSIS — I10 Essential (primary) hypertension: Secondary | ICD-10-CM

## 2022-07-12 DIAGNOSIS — M2550 Pain in unspecified joint: Secondary | ICD-10-CM

## 2022-07-12 DIAGNOSIS — Z79899 Other long term (current) drug therapy: Secondary | ICD-10-CM

## 2022-07-12 DIAGNOSIS — F4321 Adjustment disorder with depressed mood: Secondary | ICD-10-CM | POA: Insufficient documentation

## 2022-07-12 DIAGNOSIS — J431 Panlobular emphysema: Secondary | ICD-10-CM | POA: Diagnosis not present

## 2022-07-12 DIAGNOSIS — F250 Schizoaffective disorder, bipolar type: Secondary | ICD-10-CM | POA: Diagnosis not present

## 2022-07-12 DIAGNOSIS — M5442 Lumbago with sciatica, left side: Secondary | ICD-10-CM

## 2022-07-12 DIAGNOSIS — M62838 Other muscle spasm: Secondary | ICD-10-CM | POA: Insufficient documentation

## 2022-07-12 DIAGNOSIS — Z8639 Personal history of other endocrine, nutritional and metabolic disease: Secondary | ICD-10-CM

## 2022-07-12 DIAGNOSIS — G8929 Other chronic pain: Secondary | ICD-10-CM

## 2022-07-12 DIAGNOSIS — R350 Frequency of micturition: Secondary | ICD-10-CM

## 2022-07-12 DIAGNOSIS — K219 Gastro-esophageal reflux disease without esophagitis: Secondary | ICD-10-CM

## 2022-07-12 DIAGNOSIS — R0602 Shortness of breath: Secondary | ICD-10-CM | POA: Insufficient documentation

## 2022-07-12 DIAGNOSIS — M5441 Lumbago with sciatica, right side: Secondary | ICD-10-CM

## 2022-07-12 MED ORDER — ADULT MULTIVITAMIN W/MINERALS CH: 1.0000 | ORAL_TABLET | Freq: Every day | ORAL | 1 refills | Status: AC

## 2022-07-12 MED ORDER — IPRATROPIUM-ALBUTEROL 0.5-2.5 (3) MG/3ML IN SOLN: 3.0000 mL | Freq: Four times a day (QID) | RESPIRATORY_TRACT | 1 refills | Status: AC | PRN

## 2022-07-12 MED ORDER — DIVALPROEX SODIUM 500 MG PO DR TAB: 500.0000 mg | DELAYED_RELEASE_TABLET | Freq: Three times a day (TID) | ORAL | 3 refills | Status: AC

## 2022-07-12 MED ORDER — MONTELUKAST SODIUM 10 MG PO TABS: ORAL_TABLET | ORAL | 1 refills | Status: DC

## 2022-07-12 MED ORDER — SYMBICORT 160-4.5 MCG/ACT IN AERO: INHALATION_SPRAY | RESPIRATORY_TRACT | 11 refills | Status: DC

## 2022-07-12 MED ORDER — OXYCODONE-ACETAMINOPHEN 10-325 MG PO TABS: 1.0000 | ORAL_TABLET | Freq: Three times a day (TID) | ORAL | 0 refills | Status: DC | PRN

## 2022-07-12 MED ORDER — CITALOPRAM HYDROBROMIDE 40 MG PO TABS: 40.0000 mg | ORAL_TABLET | Freq: Every day | ORAL | 3 refills | Status: AC

## 2022-07-12 MED ORDER — LORAZEPAM 0.5 MG PO TABS: 0.5000 mg | ORAL_TABLET | Freq: Two times a day (BID) | ORAL | 1 refills | Status: DC | PRN

## 2022-07-12 MED ORDER — METHOCARBAMOL 500 MG PO TABS: 500.0000 mg | ORAL_TABLET | Freq: Four times a day (QID) | ORAL | 2 refills | Status: AC

## 2022-07-12 MED ORDER — LORAZEPAM 0.5 MG PO TABS: 0.5000 mg | ORAL_TABLET | Freq: Two times a day (BID) | ORAL | 0 refills | Status: AC | PRN

## 2022-07-12 MED ORDER — AMLODIPINE BESYLATE 5 MG PO TABS: 5.0000 mg | ORAL_TABLET | Freq: Every day | ORAL | 1 refills | Status: DC

## 2022-07-12 MED ORDER — GABAPENTIN 400 MG PO CAPS: 400.0000 mg | ORAL_CAPSULE | Freq: Three times a day (TID) | ORAL | 1 refills | Status: AC

## 2022-07-12 MED ORDER — QUETIAPINE FUMARATE ER 50 MG PO TB24: 50.0000 mg | ORAL_TABLET | Freq: Every day | ORAL | 0 refills | Status: AC

## 2022-07-12 MED ORDER — MIRABEGRON ER 50 MG PO TB24: 50.0000 mg | ORAL_TABLET | Freq: Every day | ORAL | 1 refills | Status: AC

## 2022-07-12 MED ORDER — PANTOPRAZOLE SODIUM 20 MG PO TBEC: 20.0000 mg | DELAYED_RELEASE_TABLET | Freq: Every day | ORAL | 0 refills | Status: AC

## 2022-07-12 NOTE — Assessment & Plan Note (Signed)
Refer to pulmonary. Exam unremarkable today aside from expected emphysema findings.
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
## 2022-07-12 NOTE — Assessment & Plan Note (Signed)
Continue prn ppi

## 2022-07-12 NOTE — Assessment & Plan Note (Signed)
Control is worsening. No acute symptoms. Continues to smoke. Has been lost to follow up with pulmonary, will place new referral.

## 2022-07-12 NOTE — Assessment & Plan Note (Signed)
Labs collected. Will follow up with the patient as warranted.  

## 2022-07-12 NOTE — Assessment & Plan Note (Signed)
Has run out of amlodipine. Will have her restart this. Had good control with this in the past. Labs collected. Will follow up with the patient as warranted.

## 2022-07-12 NOTE — Assessment & Plan Note (Signed)
Refill methocarbamol

## 2022-07-12 NOTE — Assessment & Plan Note (Signed)
Refill robaxin.

## 2022-07-12 NOTE — Patient Instructions (Addendum)
Melissa Bridges -   Melissa Bridges to see you  I'm so sorry for your loss  Increase citalopram to '40mg'$  daily.  Can use lorazepam as needed - use with caution!  Follow up with new PCP in 3 months  I recommend: Berniece Pap, MD Agustina Caroli, MD Myrna Blazer Early, NP Jeralyn Ruths, DNP** Pricilla Holm, MD** Celso Amy, MD**  ** these three are at [facility] - this should be close to you!  I will call if labs are concerning,  Thanks,  Rich    If you have lab work done today you will be contacted with your lab results within the next 2 weeks.  If you have not heard from [place] then please contact us. The fastest way to get your results is to register for My Chart.   IF you received an x-ray today, you will receive an invoice from [facility]. Please contact [facility] at [phone] with questions or concerns regarding your invoice.   IF you received labwork today, you will receive an invoice from [facility]. Please contact [facility] at [phone] with questions or concerns regarding your invoice.   Our billing staff will not be able to assist you with questions regarding bills from these companies.  You will be contacted with the lab results as soon as they are available. The fastest way to get your results is to activate your My Chart account. Instructions are located on the last page of this paperwork. If you have not heard from [place] regarding the results in 2 weeks, please contact this office.

## 2022-07-12 NOTE — Assessment & Plan Note (Signed)
Check A1c, follow up as indicated.

## 2022-07-12 NOTE — Assessment & Plan Note (Signed)
Increase celexa to '40mg'$  po qd. Advise her to follow up with psychiatry at [place] availability.

## 2022-07-12 NOTE — Assessment & Plan Note (Signed)
Will refill opioid pain reliever. pdmp consulted, no concerns. Reviewed risks, benefits, and side effects, pt voices understanding.

## 2022-07-12 NOTE — Assessment & Plan Note (Signed)
Will increase celexa. She will continue to follow with psychiatry.

## 2022-07-27 ENCOUNTER — Telehealth: Payer: Self-pay | Admitting: Registered Nurse

## 2022-07-27 DIAGNOSIS — G8929 Other chronic pain: Secondary | ICD-10-CM

## 2022-07-27 MED ORDER — OXYCODONE-ACETAMINOPHEN 10-325 MG PO TABS: 1.0000 | ORAL_TABLET | Freq: Three times a day (TID) | ORAL | 0 refills | Status: AC | PRN

## 2022-07-27 NOTE — Telephone Encounter (Signed)
Encourage patient to contact the pharmacy for refills or they can request refills through [facility]  (Please schedule appointment if patient has not been seen in over a year)    WHAT [place] TO: [facility] [phone]  MEDICATION NAME & DOSE: oxycodone-acetaminophen 10-325 mg  NOTES/COMMENTS FROM PATIENT:      [facility] office please notify patient: It takes 48-72 hours to process rx refill requests Ask patient to call pharmacy to ensure rx is ready before heading there.

## 2022-07-27 NOTE — Telephone Encounter (Signed)
This will be the last refill provided by this office.  She needs to establish with a new provider.  She should not take any additional Acetaminophen (Tylenol) when taking the Oxycodone as it has acetaminophen in it

## 2022-07-27 NOTE — Telephone Encounter (Signed)
I explained to the pt

## 2022-08-19 ENCOUNTER — Encounter: Payer: Self-pay | Admitting: Pulmonary Disease

## 2022-08-19 ENCOUNTER — Ambulatory Visit (INDEPENDENT_AMBULATORY_CARE_PROVIDER_SITE_OTHER): Payer: Medicare Other | Admitting: Pulmonary Disease

## 2022-08-19 VITALS — BP 118/58 | HR 88 | Ht 63.0 in | Wt 104.2 lb

## 2022-08-19 DIAGNOSIS — F17210 Nicotine dependence, cigarettes, uncomplicated: Secondary | ICD-10-CM

## 2022-08-19 DIAGNOSIS — J432 Centrilobular emphysema: Secondary | ICD-10-CM

## 2022-08-19 MED ORDER — TRELEGY ELLIPTA 100-62.5-25 MCG/ACT IN AEPB: 1.0000 | INHALATION_SPRAY | Freq: Every day | RESPIRATORY_TRACT | 11 refills | Status: AC

## 2022-08-19 MED ORDER — ALBUTEROL SULFATE HFA 108 (90 BASE) MCG/ACT IN AERS: 2.0000 | INHALATION_SPRAY | Freq: Four times a day (QID) | RESPIRATORY_TRACT | 6 refills | Status: DC | PRN

## 2022-08-19 NOTE — Patient Instructions (Addendum)
Stop symbicort inhaler  Start trelegy ellipta 1 puff daily - rinse mouth out after each use  Use albuterol inhaler or duoneb nebulizer treatments every 4-6 hours as needed   We will refer you to our lung cancer screening program  We will check a blood test today for alpha 1 antirypsin which can be related to emphysema  Recommend quitting smoking using '14mg'$  nicotine patches daily along with mini nicotine lozenges as needed for break through cravings. Stopping smoking will prevent your emphysema from worsening.   Follow up in 6 months with pulmonary function tests

## 2022-08-19 NOTE — Progress Notes (Signed)
Synopsis: Referred in September 2023 for emphysema by Maximiano Coss, NP  Subjective:   PATIENT ID: Melissa Bridges GENDER: female DOB: [birth year], MRN: [id number]  HPI  Chief Complaint  Patient presents with   Consult    Pt is here for consult for panlobular emphysema. Pt states she has a had time doing anything before she runs out of breath. Pt states that she does have a mild cough. Pt states that this has been occurring for 5 years. Pt states that Symbicort did help her in the past. Pt does have nebulizer machine at home too.    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  She reports progressive dyspnea over the past 5 years. She does have intermittent cough and wheezing. She does report some blood mixed in her sputum a couple times last month and once this month. She does wake up with cough and wheezing at night. She is currently using symbicort 160-4.26mg 2 puffs twice daily and as needed albuterol.   CT Chest 07/28/21 showed centrilobular emphysema.   She is smoking 1 pack per day every 3 days. She has been smoking for 49 years, and was previously smoking 1 pack per day. She worked in a sMuseum/gallery exhibitions officerwith lots of dust. She is living with her daughter. She is widowed.    Past Medical History:  Diagnosis Date   Allergy    Alopecia    Anxiety    Arthritis    Asthma    Asthma    [employer]reesia 10/22/2020   Cataract    "mild"   COPD ([place] obstructive [place] disease) [place]    Depression    Depression    Phreesia 10/22/2020   GERD (gastroesophageal reflux disease)    o cc- takes OTC if needed.   Glaucoma    Hepatitis C    Hypertension    onset age 61   Iron deficiency anemia    Migraine    Schizoaffective disorder (HCC)    Sciatic pain    Seizures (HCC)    onset in childhood. 06-03-16- per pt 8 months agoGeneralized tonic-clonic.Last seizure couple of  months ago- last sz 9-10 months ago per pt 10-12-2016   Shortness of breath dyspnea    Substance abuse ([facility]    Ulcer      Family History  Problem Relation Age of Onset   Diabetes type II Mother    Hypertension Mother    Arthritis Mother    Diabetes Mother    Mental illness Mother        bipolar   Diabetes type II Maternal Aunt    Hypertension Father    Heart murmur Father    Arthritis Father    Stroke Father    Alopecia Sister    Alopecia Brother    Alopecia Sister    Mental retardation Sister        depression   Prostate cancer Paternal Grandfather    Cancer Maternal Uncle    Colon cancer Neg Hx    Esophageal cancer Neg Hx    Rectal cancer Neg Hx    Stomach cancer Neg Hx    Colon polyps Neg Hx      Social History   Socioeconomic History   Marital status: Widowed    Spouse name: Not on file   Number of children: 1   Years of education: Not on file  Highest education level: Not on file  Occupational History   Occupation: disabled    Comment: mental illness; seizures  Tobacco Use   Smoking status: Every Day    Packs/day: 0.50    Years: 44.00    Total pack years: 22.00    Types: Cigarettes    Start date: 73   Smokeless tobacco: Never   Tobacco comments:    Pt states that she smoke 1 pack over a course of 3 days   Vaping Use   Vaping Use: Never used  Substance and Sexual Activity   Alcohol use: Yes    Alcohol/week: 0.0 standard drinks of alcohol    Comment: 1 pakj every 3 days   Drug use: Not Currently    Types: Marijuana, Cocaine    Comment: not used in 5 months    Sexual activity: Not Currently    Birth control/protection: None    Comment: widow  Other Topics Concern   Not on file  Social History Narrative   Marital status:  Widowed since 2002.  Married x 16 years. + dating.  Moved from [place] to live with daughter in 2013.     Children:  One child/daughter (33); two grandchildren.      Lives: with daughter, grandchildren 2.  Does not  drive due to epilepsy.      Employment:  Disability for schizoaffective disorder.      Tobacco: 1 ppd x since 8th grade.      Alcohol:  Social; rare drinking due to seizure medications.  Weekends.       Drugs: none; previous use of marijuana.  Previous iv drug use, cocaine.      Exercise: none      Seatbelt:  100%      Guns: none   Social Determinants of Radio broadcast assistant Strain: Not on file  Food Insecurity: Not on file  Transportation Needs: Not on file  Physical Activity: Not on file  Stress: Not on file  Social Connections: Not on file  Intimate Partner Violence: Not on file     Allergies  Allergen Reactions   Fruit & Vegetable Daily [Nutritional Supplements] Shortness Of Breath    Reaction to Aloe   Iodinated Contrast Media Swelling    Swelling and itching of left side of her face only after CT SI injection(no steroid used)   Ibuprofen Other (See Comments)    Causes Stomach upset; However, pt can take Meloxicam without incident and takes this at home.    Aloe Vera Rash   Aspirin Rash and Other (See Comments)    Stomach upset     Outpatient Medications Prior to Visit  Medication Sig Dispense Refill   acetaminophen (TYLENOL) 500 MG tablet Take 2,000-2,500 mg by mouth every 4 (four) hours as needed (pain).     amLODipine (NORVASC) 5 MG tablet Take 1 tablet (5 mg total) by mouth daily. 90 tablet 1   Blood Pressure Monitor KIT Dispense one blood pressure monitor to blood pressure 1 kit 0   citalopram (CELEXA) 40 MG tablet Take 1 tablet (40 mg total) by mouth daily. 90 tablet 3   divalproex (DEPAKOTE) 500 MG DR tablet Take 1 tablet (500 mg total) by mouth 3 (three) times daily. 270 tablet 3   ferrous sulfate 325 (65 FE) MG tablet Take by mouth.     folic acid (FOLVITE) 1 MG tablet Take by mouth.     gabapentin (NEURONTIN) 400 MG capsule Take 1 capsule (400 mg  total) by mouth 3 (three) times daily. 270 capsule 1   ipratropium-albuterol (DUONEB) 0.5-2.5 (3) MG/3ML  SOLN Take 3 mLs by nebulization every 6 (six) hours as needed (shortness of breath). Reported on 12/02/2015 360 mL 1   LORazepam (ATIVAN) 0.5 MG tablet Take 1 tablet (0.5 mg total) by mouth 2 (two) times daily as needed for anxiety. 30 tablet 0   methocarbamol (ROBAXIN) 500 MG tablet Take 1 tablet (500 mg total) by mouth 4 (four) times daily. 120 tablet 2   mirabegron ER (MYRBETRIQ) 50 MG TB24 tablet Take 1 tablet (50 mg total) by mouth daily. 90 tablet 1   montelukast (SINGULAIR) 10 MG tablet TAKE 1 TABLET BY MOUTH EVERYDAY AT BEDTIME 90 tablet 1   Multiple Vitamin (MULTIVITAMIN WITH MINERALS) TABS tablet Take 1 tablet by mouth daily. 30 tablet 1   nystatin (MYCOSTATIN) 100000 UNIT/ML suspension Take 5 mLs (500,000 Units total) by mouth 4 (four) times daily. 60 mL 0   ondansetron (ZOFRAN) 4 MG tablet Take 1 tablet (4 mg total) by mouth every 6 (six) hours. 12 tablet 0   oxyCODONE-acetaminophen (PERCOCET) 10-325 MG tablet Take 1 tablet by mouth every 8 (eight) hours as needed for pain. 30 tablet 0   pantoprazole (PROTONIX) 20 MG tablet Take 1 tablet (20 mg total) by mouth daily. 90 tablet 0   QUEtiapine (SEROQUEL XR) 50 MG TB24 24 hr tablet Take 1 tablet (50 mg total) by mouth at bedtime. 90 tablet 0   thiamine 100 MG tablet Take 1 tablet (100 mg total) by mouth daily. 90 tablet 1   triamcinolone cream (KENALOG) 0.1 % Apply 1 application topically 2 (two) times daily as needed (rash/irritation). 80 g 3   valACYclovir (VALTREX) 1000 MG tablet TAKE 2 TABLETS BY MOUTH AT SYMPTOMS ONSET ,THEN  REPEAT IN 12 HOURS 20 tablet 0   vitamin C (ASCORBIC ACID) 250 MG tablet Take 250 mg by mouth daily.     SYMBICORT 160-4.5 MCG/ACT inhaler INHALE 2 PUFFS INTO THE LUNGS TWICE A DAY (Patient not taking: Reported on 08/19/2022) 10.2 each 11   No facility-administered medications prior to visit.   Review of Systems  Constitutional:  Negative for chills, fever, malaise/fatigue and weight loss.  HENT:  Negative for  congestion, sinus pain and sore throat.   Eyes: Negative.   Respiratory:  Positive for cough, sputum production, shortness of breath and wheezing. Negative for hemoptysis.   Cardiovascular:  Negative for chest pain, palpitations, orthopnea, claudication and leg swelling.  Gastrointestinal:  Negative for abdominal pain, heartburn, nausea and vomiting.  Genitourinary: Negative.   Musculoskeletal:  Negative for joint pain and myalgias.  Skin:  Negative for rash.  Neurological:  Negative for weakness.  Endo/Heme/Allergies: Negative.   Psychiatric/Behavioral: Negative.     Objective:   Vitals:   08/19/22 1126  BP: (!) 118/58  Pulse: 88  SpO2: 96%  Weight: 104 lb 3.2 oz (47.3 kg)  Height: _0  (1.6 m)    Physical Exam Constitutional:      General: She is not in acute distress.    Appearance: She is not ill-appearing.  HENT:     Head: Normocephalic and atraumatic.  Eyes:     General: No scleral icterus.    Conjunctiva/sclera: Conjunctivae normal.     Pupils: Pupils are equal, round, and reactive to light.  Cardiovascular:     Rate and Rhythm: Normal rate and regular rhythm.     Pulses: Normal pulses.     Heart sounds:  Normal heart sounds. No murmur heard. Pulmonary:     Effort: Pulmonary effort is normal.     Breath sounds: Normal breath sounds. Decreased air movement present. No wheezing, rhonchi or rales.  Abdominal:     General: Bowel sounds are normal.     Palpations: Abdomen is soft.  Musculoskeletal:     Right lower leg: No edema.     Left lower leg: No edema.  Lymphadenopathy:     Cervical: No cervical adenopathy.  Skin:    General: Skin is warm and dry.  Neurological:     General: No focal deficit present.     Mental Status: She is alert.  Psychiatric:        Mood and Affect: Mood normal.        Behavior: Behavior normal.        Thought Content: Thought content normal.        Judgment: Judgment normal.    CBC    Component Value Date/Time   WBC 6.2  03/25/2022 1105   RBC 3.72 (L) 03/25/2022 1105   HGB 13.7 03/25/2022 1105   HGB 14.7 07/23/2020 1154   HCT 39.8 03/25/2022 1105   HCT 42.2 07/23/2020 1154   PLT 175.0 03/25/2022 1105   PLT 195 08/28/2018 1128   MCV 106.8 (H) 03/25/2022 1105   MCV 106 (H) 07/23/2020 1154   MCH 37.3 (H) 10/22/2020 1651   MCHC 34.5 03/25/2022 1105   RDW 12.8 03/25/2022 1105   RDW 12.1 07/23/2020 1154   LYMPHSABS 1.4 03/25/2022 1105   LYMPHSABS 1.5 07/23/2020 1154   MONOABS 0.7 03/25/2022 1105   EOSABS 0.0 03/25/2022 1105   EOSABS 0.0 07/23/2020 1154   BASOSABS 0.1 03/25/2022 1105   BASOSABS 0.0 07/23/2020 1154      Latest Ref Rng & Units 03/25/2022   11:05 AM 10/09/2021    1:36 PM 04/02/2021    2:05 PM  BMP  Glucose 70 - 99 mg/dL 93  87  81   BUN 6 - 23 mg/dL _0 Creatinine 0.40 - 1.20 mg/dL 0.72  0.78  0.87   Sodium 135 - 145 mEq/L 133  131  138   Potassium 3.5 - 5.1 mEq/L 3.6  3.4  3.6   Chloride 96 - 112 mEq/L 99  96  98   CO2 19 - 32 mEq/L _1 Calcium 8.4 - 10.5 mg/dL 9.8  9.7  10.3    Chest imaging: CT Chest 07/28/21 Mediastinum/Nodes: No mediastinal, hilar, or axillary adenopathy. Trachea and esophagus are unremarkable. Thyroid unremarkable.   Lungs/Pleura: Mild centrilobular emphysema. No confluent airspace opacities or effusions.  PFT:     No data to display          Labs:  Path:  Echo 10/12/2019: LV EF 65-70%. LVH. Elevated LV end diastolic pressure. Grade I diastolic dysfunction. RV normal systolic function and size. LA and RA are normal.   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  She  has centrilobular emphysema based on CT imaging  She is to start trelegy ellipta 1 puff daily. She is to stop Symbicort. Inhaler demonstration/teaching provided.   She can use albterol  every 4-6 hours as needed.  We will refer her to lung cancer screening for low dose CT chest scans.   We discussed smoking cessation with nicotine patches and lozenges.   We will check alpha 1 antitrypsin labs today.  Follow up in 6 months with PFTs.   Freda Jackson, MD [facility] Office: [phone]   Current Outpatient Medications:    acetaminophen (TYLENOL) 500 MG tablet, Take 2,000-2,500 mg by mouth every 4 (four) hours as needed (pain)., Disp: , Rfl:    albuterol (VENTOLIN HFA) 108 (90 Base) MCG/ACT inhaler, Inhale 2 puffs into the lungs every 6 (six) hours as needed for wheezing or shortness of breath., Disp: 8 g, Rfl: 6   amLODipine (NORVASC) 5 MG tablet, Take 1 tablet (5 mg total) by mouth daily., Disp: 90 tablet, Rfl: 1   Blood Pressure Monitor KIT, Dispense one blood pressure monitor to blood pressure, Disp: 1 kit, Rfl: 0   citalopram (CELEXA) 40 MG tablet, Take 1 tablet (40 mg total) by mouth daily., Disp: 90 tablet, Rfl: 3   divalproex (DEPAKOTE) 500 MG DR tablet, Take 1 tablet (500 mg total) by mouth 3 (three) times daily., Disp: 270 tablet, Rfl: 3   ferrous sulfate 325 (65 FE) MG tablet, Take by mouth., Disp: , Rfl:    Fluticasone-Umeclidin-Vilant (TRELEGY ELLIPTA) 100-62.5-25 MCG/ACT AEPB, Inhale 1 puff into the lungs daily., Disp: 28 each, Rfl: 11   folic acid (FOLVITE) 1 MG tablet, Take by mouth., Disp: , Rfl:    gabapentin (NEURONTIN) 400 MG capsule, Take 1 capsule (400 mg total) by mouth 3 (three) times daily., Disp: 270 capsule, Rfl: 1   ipratropium-albuterol (DUONEB) 0.5-2.5 (3) MG/3ML SOLN, Take 3 mLs by nebulization every 6 (six) hours as needed (shortness of breath). Reported on 12/02/2015, Disp: 360 mL, Rfl: 1   LORazepam (ATIVAN) 0.5 MG tablet, Take 1  tablet (0.5 mg total) by mouth 2 (two) times daily as needed for anxiety., Disp: 30 tablet, Rfl: 0   methocarbamol (ROBAXIN) 500 MG tablet, Take 1 tablet (500 mg total) by mouth 4 (four) times daily., Disp: 120 tablet, Rfl: 2   mirabegron ER (MYRBETRIQ) 50 MG TB24 tablet, Take 1 tablet (50 mg total) by mouth daily., Disp: 90 tablet, Rfl: 1   montelukast (SINGULAIR) 10 MG tablet, TAKE 1 TABLET BY MOUTH EVERYDAY AT BEDTIME, Disp: 90 tablet, Rfl: 1   Multiple Vitamin (MULTIVITAMIN WITH MINERALS) TABS tablet, Take 1 tablet by mouth daily., Disp: 30 tablet, Rfl: 1   nystatin (MYCOSTATIN) 100000 UNIT/ML suspension, Take 5 mLs (500,000 Units total) by mouth 4 (four) times daily., Disp: 60 mL, Rfl: 0   ondansetron (ZOFRAN) 4 MG tablet, Take 1 tablet (4 mg total) by mouth every 6 (six) hours., Disp: 12 tablet, Rfl: 0   oxyCODONE-acetaminophen (PERCOCET) 10-325 MG tablet, Take 1 tablet by mouth every 8 (eight) hours as needed for pain., Disp: 30 tablet, Rfl: 0   pantoprazole (PROTONIX) 20 MG tablet, Take 1 tablet (20 mg total) by mouth daily., Disp: 90 tablet, Rfl: 0   QUEtiapine (SEROQUEL XR) 50 MG TB24 24 hr tablet, Take 1 tablet (50 mg total) by mouth at bedtime., Disp: 90 tablet, Rfl: 0   thiamine 100 MG tablet, Take 1 tablet (100 mg total) by mouth daily., Disp: 90 tablet, Rfl: 1   triamcinolone cream (KENALOG) 0.1 %, Apply 1 application topically 2 (two) times daily as needed (rash/irritation)., Disp: 80 g, Rfl: 3   valACYclovir (VALTREX) 1000 MG tablet,  TAKE 2 TABLETS BY MOUTH AT SYMPTOMS ONSET ,THEN  REPEAT IN 12 HOURS, Disp: 20 tablet, Rfl: 0   vitamin C (ASCORBIC ACID) 250 MG tablet, Take 250 mg by mouth daily., Disp: , Rfl:

## 2022-08-26 LAB — ALPHA-1 ANTITRYPSIN PHENOTYPE: A-1 Antitrypsin, Ser: 141 mg/dL (ref 83–199)

## 2022-09-27 ENCOUNTER — Ambulatory Visit: Payer: Medicare Other | Admitting: Registered Nurse

## 2022-10-29 ENCOUNTER — Other Ambulatory Visit: Payer: Self-pay | Admitting: Family Medicine

## 2022-10-29 DIAGNOSIS — Z1231 Encounter for screening mammogram for malignant neoplasm of breast: Secondary | ICD-10-CM

## 2022-11-03 ENCOUNTER — Ambulatory Visit: Payer: Medicare Other

## 2022-12-29 ENCOUNTER — Ambulatory Visit: Payer: Medicare Other

## 2023-01-26 ENCOUNTER — Ambulatory Visit
Admission: RE | Admit: 2023-01-26 | Discharge: 2023-01-26 | Disposition: A | Discharge status: Home / Self Care | Payer: 59 | Source: Ambulatory Visit | Attending: Family Medicine | Admitting: Family Medicine

## 2023-01-26 DIAGNOSIS — Z1231 Encounter for screening mammogram for malignant neoplasm of breast: Secondary | ICD-10-CM

## 2023-09-09 ENCOUNTER — Ambulatory Visit: Payer: 59 | Admitting: Neurology

## 2023-09-18 ENCOUNTER — Other Ambulatory Visit: Payer: Self-pay

## 2023-09-18 ENCOUNTER — Encounter (HOSPITAL_COMMUNITY): Payer: Self-pay

## 2023-09-18 ENCOUNTER — Emergency Department (HOSPITAL_COMMUNITY)
Admission: EM | Admit: 2023-09-18 | Discharge: 2023-09-18 | Disposition: A | Discharge status: Home / Self Care | Payer: 59 | Attending: Emergency Medicine | Admitting: Emergency Medicine

## 2023-09-18 DIAGNOSIS — M5432 Sciatica, left side: Secondary | ICD-10-CM | POA: Diagnosis present

## 2023-09-18 NOTE — Discharge Instructions (Signed)
Please follow-up with the neurosurgeon I have attached your for you today for your sciatica.  Today your exam was reassuring and you are on the right medication so please continue take these as prescribed.  If symptoms change or worsen please return to ER.

## 2023-09-18 NOTE — ED Triage Notes (Signed)
Patient reports right sciatic nerve pain since last night. Took Tylenol and aleve without relief.

## 2023-09-18 NOTE — ED Provider Notes (Signed)
EMERGENCY DEPARTMENT AT [facility] Provider Note   CSN: [id number] Arrival date & time: 09/18/23  1242     History  Chief Complaint  Patient presents with   Back Pain    Melissa Bridges is a 62 y.o. female history of sciatica presented with right-sided sciatica pain that occurred last night that feels similar to previous sciatica flares.  Patient notes that the pain shoots down her left side when she moves but is able to walk.  Patient denies saddle anesthesia, new onset weakness, fevers, IVDU, urinary/bowel incontinence, abdominal pain, nauseous vomiting, fevers, dysuria, constipation/diarrhea.  Patient states she is here now for pain meds but to be referred to a neurosurgeon as she has been referred in the past but does not have the phone number.   Home Medications Prior to Admission medications   Medication Sig Start Date End Date Taking? Authorizing Provider  acetaminophen (TYLENOL) 500 MG tablet Take 2,000-2,500 mg by mouth every 4 (four) hours as needed (pain).    [provider]  albuterol (VENTOLIN HFA) 108 (90 Base) MCG/ACT inhaler Inhale 2 puffs into the lungs every 6 (six) hours as needed for wheezing or shortness of breath. 08/19/22   Martina Sinner, MD  amLODipine (NORVASC) 5 MG tablet Take 1 tablet (5 mg total) by mouth daily. 07/12/22   Janeece Agee, NP  Blood Pressure Monitor KIT Dispense one blood pressure monitor to blood pressure 10/22/19   Stallings, Zoe A, MD  citalopram (CELEXA) 40 MG tablet Take 1 tablet (40 mg total) by mouth daily. 07/12/22   Janeece Agee, NP  divalproex (DEPAKOTE) 500 MG DR tablet Take 1 tablet (500 mg total) by mouth 3 (three) times daily. 07/12/22   Janeece Agee, NP  ferrous sulfate 325 (65 FE) MG tablet Take by mouth. 07/05/18   [provider]  Fluticasone-Umeclidin-Vilant (TRELEGY ELLIPTA) 100-62.5-25 MCG/ACT AEPB Inhale 1 puff into the lungs daily. 08/19/22   Martina Sinner, MD  folic  acid (FOLVITE) 1 MG tablet Take by mouth. 07/16/18   [provider]  gabapentin (NEURONTIN) 400 MG capsule Take 1 capsule (400 mg total) by mouth 3 (three) times daily. 07/12/22   Janeece Agee, NP  ipratropium-albuterol (DUONEB) 0.5-2.5 (3) MG/3ML SOLN Take 3 mLs by nebulization every 6 (six) hours as needed (shortness of breath). Reported on 12/02/2015 07/12/22   Janeece Agee, NP  LORazepam (ATIVAN) 0.5 MG tablet Take 1 tablet (0.5 mg total) by mouth 2 (two) times daily as needed for anxiety. 07/12/22   Janeece Agee, NP  methocarbamol (ROBAXIN) 500 MG tablet Take 1 tablet (500 mg total) by mouth 4 (four) times daily. 07/12/22   Janeece Agee, NP  mirabegron ER (MYRBETRIQ) 50 MG TB24 tablet Take 1 tablet (50 mg total) by mouth daily. 07/12/22   Janeece Agee, NP  montelukast (SINGULAIR) 10 MG tablet TAKE 1 TABLET BY MOUTH EVERYDAY AT BEDTIME 07/12/22   Janeece Agee, NP  Multiple Vitamin (MULTIVITAMIN WITH MINERALS) TABS tablet Take 1 tablet by mouth daily. 07/12/22   Janeece Agee, NP  nystatin (MYCOSTATIN) 100000 UNIT/ML suspension Take 5 mLs (500,000 Units total) by mouth 4 (four) times daily. 07/09/21   Janeece Agee, NP  ondansetron (ZOFRAN) 4 MG tablet Take 1 tablet (4 mg total) by mouth every 6 (six) hours. 10/22/20   Bethann Berkshire, MD  oxyCODONE-acetaminophen (PERCOCET) 10-325 MG tablet Take 1 tablet by mouth every 8 (eight) hours as needed for pain. 07/27/22   Sheliah Hatch, MD  pantoprazole (PROTONIX) 20 MG tablet Take 1 tablet (20 mg total) by mouth daily. 07/12/22   Janeece Agee, NP  QUEtiapine (SEROQUEL XR) 50 MG TB24 24 hr tablet Take 1 tablet (50 mg total) by mouth at bedtime. 07/12/22   Janeece Agee, NP  thiamine 100 MG tablet Take 1 tablet (100 mg total) by mouth daily. 02/05/19   Lezlie Lye, Meda Coffee, MD  triamcinolone cream (KENALOG) 0.1 % Apply 1 application topically 2 (two) times daily as needed (rash/irritation). 07/09/21   Janeece Agee, NP   valACYclovir (VALTREX) 1000 MG tablet TAKE 2 TABLETS BY MOUTH AT SYMPTOMS ONSET ,THEN  REPEAT IN 12 HOURS 05/20/22   Janeece Agee, NP  vitamin C (ASCORBIC ACID) 250 MG tablet Take 250 mg by mouth daily.    [provider]      Allergies    Fruit & vegetable daily [nutritional supplements], Iodinated contrast media, Ibuprofen, Aloe vera, and Aspirin    Review of Systems   Review of Systems  Musculoskeletal:  Positive for back pain.    Physical Exam Updated Vital Signs BP 138/82 (BP Location: Right Arm)   Pulse 84   Temp 98.3 F (36.8 C) (Oral)   Resp 17   Ht 5\' 3"  (1.6 m)   Wt 46.3 kg   SpO2 99%   BMI 18.07 kg/m  Physical Exam Constitutional:      General: She is not in acute distress. Cardiovascular:     Rate and Rhythm: Normal rate.     Pulses: Normal pulses.  Abdominal:     Palpations: Abdomen is soft.     Tenderness: There is no abdominal tenderness. There is no guarding or rebound.  Musculoskeletal:        General: Normal range of motion.     Comments: Positive leg test on the left side 5 out of 5 bilateral hip flexion No midline tenderness or abnormalities palpated  Skin:    General: Skin is warm and dry.     Capillary Refill: Capillary refill takes less than 2 seconds.  Neurological:     Mental Status: She is alert.     Comments: Sensation intact distally 2+ bilateral patellar reflexes Able to ambulate without difficulty does endorse pain when ambulating  Psychiatric:        Mood and Affect: Mood normal.     ED Results / Procedures / Treatments   Labs (all labs ordered are listed, but only abnormal results are displayed) Labs Reviewed - No data to display  EKG None  Radiology No results found.  Procedures Procedures    Medications Ordered in ED Medications - No data to display  ED Course/ Medical Decision Making/ A&P                                 Medical Decision Making  [facility] 62 y.o. presented today for  back pain. Working DDx that I considered at this time includes, but not limited to, sciatica, MSK, underlying fracture, epidural hematoma/abscess, cauda equina syndrome, spinal stenosis, spinal malignancy, discitis, spinal infection, spondylitises/ spondylosis, conus medullaris, DDD of the back.  R/o DDx: MSK, underlying fracture, epidural hematoma/abscess, cauda equina syndrome, spinal stenosis, spinal malignancy, discitis, spinal infection, spondylitises/ spondylosis, conus medullaris, DDD of the back : less likely due to history of present illness, physical exam, labs/imaging findings.  Review of prior external notes: 08/19/2022 office visit  Unique Tests and My Interpretation: None  Discussion with Independent Historian: None  Discussion of Management of Tests: None  Risk: Low: based on diagnostic testing/clinical impression and treatment plan  Risk Stratification Score: None  Plan: On exam patient was no acute distress with stable vitals. No neurological deficits and normal neuro exam.  Patient does have positive left-sided straight leg test and initially patient stated that she was having right-sided sciatica however when I was doing the exam she stated that was actually on her left side and not right side.  Patient can walk but states is painful.  No loss of bowel or bladder control.  No concern for cauda equina.  No fever, night sweats, weight loss, h/o cancer, IVDU.  RICE protocol was recommended for the patient.  I suggested changing patient's muscle relaxer at home to [facility] however patient states the Robaxin helps her the most and that she is already on lidocaine patches and does not want medications at this time as she is already on the ones that help her.  Patient states she just wants to have a neurosurgery referral and so this will be placed in the discharge papers for her.  Patient was given return precautions. Patient stable for discharge at this time.  Patient verbalized  understanding of plan.  This chart was dictated using voice recognition software.  Despite best efforts to proofread,  errors can occur which can change the documentation meaning.         Final Clinical Impression(s) / ED Diagnoses Final diagnoses:  Sciatica of left side    Rx / DC Orders ED Discharge Orders     None         Remi Deter 09/18/23 Neomia Dear, MD 09/19/23 2213

## 2023-11-06 ENCOUNTER — Emergency Department (HOSPITAL_COMMUNITY): Payer: 59

## 2023-11-06 ENCOUNTER — Other Ambulatory Visit: Payer: Self-pay

## 2023-11-06 ENCOUNTER — Emergency Department (HOSPITAL_COMMUNITY)
Admission: EM | Admit: 2023-11-06 | Discharge: 2023-11-07 | Disposition: A | Discharge status: Home / Self Care | Payer: 59 | Attending: Emergency Medicine | Admitting: Emergency Medicine

## 2023-11-06 ENCOUNTER — Encounter (HOSPITAL_COMMUNITY): Payer: Self-pay

## 2023-11-06 DIAGNOSIS — I10 Essential (primary) hypertension: Secondary | ICD-10-CM

## 2023-11-06 DIAGNOSIS — R0602 Shortness of breath: Secondary | ICD-10-CM

## 2023-11-06 DIAGNOSIS — J432 Centrilobular emphysema: Secondary | ICD-10-CM

## 2023-11-06 DIAGNOSIS — M545 Low back pain, unspecified: Secondary | ICD-10-CM | POA: Diagnosis present

## 2023-11-06 DIAGNOSIS — G8929 Other chronic pain: Secondary | ICD-10-CM | POA: Diagnosis not present

## 2023-11-06 DIAGNOSIS — M25512 Pain in left shoulder: Secondary | ICD-10-CM | POA: Insufficient documentation

## 2023-11-06 MED ORDER — MONTELUKAST SODIUM 10 MG PO TABS: ORAL_TABLET | ORAL | 1 refills | Status: AC

## 2023-11-06 MED ORDER — OXYCODONE-ACETAMINOPHEN 5-325 MG PO TABS
1.0000 | ORAL_TABLET | Freq: Once | ORAL | Status: AC
  Administered 2023-11-06: 1 via ORAL
  Filled 2023-11-06: qty 1

## 2023-11-06 MED ORDER — OXYCODONE HCL 5 MG PO TABS: 5.0000 mg | ORAL_TABLET | Freq: Four times a day (QID) | ORAL | 0 refills | Status: AC | PRN

## 2023-11-06 MED ORDER — ALBUTEROL SULFATE HFA 108 (90 BASE) MCG/ACT IN AERS: 2.0000 | INHALATION_SPRAY | Freq: Four times a day (QID) | RESPIRATORY_TRACT | 6 refills | Status: AC | PRN

## 2023-11-06 MED ORDER — OXYCODONE HCL 5 MG PO TABS
5.0000 mg | ORAL_TABLET | Freq: Once | ORAL | Status: AC
  Administered 2023-11-06: 5 mg via ORAL
  Filled 2023-11-06: qty 1

## 2023-11-06 MED ORDER — CLONIDINE HCL 0.1 MG PO TABS
0.1000 mg | ORAL_TABLET | Freq: Once | ORAL | Status: AC
  Administered 2023-11-06: 0.1 mg via ORAL
  Filled 2023-11-06: qty 1

## 2023-11-06 MED ORDER — AMLODIPINE BESYLATE 5 MG PO TABS: 5.0000 mg | ORAL_TABLET | Freq: Every day | ORAL | 1 refills | Status: AC

## 2023-11-06 MED ORDER — AMLODIPINE BESYLATE 5 MG PO TABS
5.0000 mg | ORAL_TABLET | Freq: Once | ORAL | Status: AC
Start: 2023-11-06 — End: 2023-11-06
  Administered 2023-11-06: 5 mg via ORAL
  Filled 2023-11-06: qty 1

## 2023-11-06 NOTE — ED Notes (Signed)
Pt residual volume 30ml

## 2023-11-06 NOTE — ED Notes (Signed)
Pt systolic pressure 191 MD notified. Orders placed

## 2023-11-06 NOTE — ED Notes (Signed)
Pt c/o blurred vision and back pain. MD notified. MD at bedside. Meds administered

## 2023-11-06 NOTE — ED Provider Notes (Signed)
EMERGENCY DEPARTMENT AT [facility] Provider Note   CSN: [id number] Arrival date & time: 11/06/23  1812     History  Chief Complaint  Patient presents with   Fall   Back Pain    Olean Evarose Schutt is a 62 y.o. female.  HPI   62 year old female presents emergency department with acute on chronic low back pain and radiating pain/numbness down the left leg.  This is a chronic complaint, she is scheduled to follow-up as an outpatient with orthopedics.  She reports that she recently had an MRI done.  She states that she has been compliant with her medications.  However today the pain became sharp all of a sudden and this caused her to fall down onto her left side.  She caught herself with her left shoulder and is complaining of left shoulder pain.  Otherwise denies any head injury, loss of consciousness, syncope.  She has scattered numbness of the left lower extremity at baseline.  Denies any specific saddle anesthesia.  She sometimes has urinary incontinence however this seems to be secondary to the patient not being able to make it to the restroom soon enough.  Denies any other fever or acute change in her health.  Home Medications Prior to Admission medications   Medication Sig Start Date End Date Taking? Authorizing Provider  acetaminophen (TYLENOL) 500 MG tablet Take 2,000-2,500 mg by mouth every 4 (four) hours as needed (pain).    [provider]  albuterol (VENTOLIN HFA) 108 (90 Base) MCG/ACT inhaler Inhale 2 puffs into the lungs every 6 (six) hours as needed for wheezing or shortness of breath. 08/19/22   Martina Sinner, MD  amLODipine (NORVASC) 5 MG tablet Take 1 tablet (5 mg total) by mouth daily. 07/12/22   Janeece Agee, NP  Blood Pressure Monitor KIT Dispense one blood pressure monitor to blood pressure 10/22/19   Stallings, Zoe A, MD  citalopram (CELEXA) 40 MG tablet Take 1 tablet (40 mg total) by mouth daily. 07/12/22   Janeece Agee, NP   divalproex (DEPAKOTE) 500 MG DR tablet Take 1 tablet (500 mg total) by mouth 3 (three) times daily. 07/12/22   Janeece Agee, NP  ferrous sulfate 325 (65 FE) MG tablet Take by mouth. 07/05/18   [provider]  Fluticasone-Umeclidin-Vilant (TRELEGY ELLIPTA) 100-62.5-25 MCG/ACT AEPB Inhale 1 puff into the lungs daily. 08/19/22   Martina Sinner, MD  folic acid (FOLVITE) 1 MG tablet Take by mouth. 07/16/18   [provider]  gabapentin (NEURONTIN) 400 MG capsule Take 1 capsule (400 mg total) by mouth 3 (three) times daily. 07/12/22   Janeece Agee, NP  ipratropium-albuterol (DUONEB) 0.5-2.5 (3) MG/3ML SOLN Take 3 mLs by nebulization every 6 (six) hours as needed (shortness of breath). Reported on 12/02/2015 07/12/22   Janeece Agee, NP  LORazepam (ATIVAN) 0.5 MG tablet Take 1 tablet (0.5 mg total) by mouth 2 (two) times daily as needed for anxiety. 07/12/22   Janeece Agee, NP  methocarbamol (ROBAXIN) 500 MG tablet Take 1 tablet (500 mg total) by mouth 4 (four) times daily. 07/12/22   Janeece Agee, NP  mirabegron ER (MYRBETRIQ) 50 MG TB24 tablet Take 1 tablet (50 mg total) by mouth daily. 07/12/22   Janeece Agee, NP  montelukast (SINGULAIR) 10 MG tablet TAKE 1 TABLET BY MOUTH EVERYDAY AT BEDTIME 07/12/22   Janeece Agee, NP  Multiple Vitamin (MULTIVITAMIN WITH MINERALS) TABS tablet Take 1 tablet by mouth daily. 07/12/22   Janeece Agee, NP  nystatin (MYCOSTATIN) 100000 UNIT/ML suspension Take 5 mLs (500,000 Units total) by mouth 4 (four) times daily. 07/09/21   Janeece Agee, NP  ondansetron (ZOFRAN) 4 MG tablet Take 1 tablet (4 mg total) by mouth every 6 (six) hours. 10/22/20   Bethann Berkshire, MD  oxyCODONE-acetaminophen (PERCOCET) 10-325 MG tablet Take 1 tablet by mouth every 8 (eight) hours as needed for pain. 07/27/22   Sheliah Hatch, MD  pantoprazole (PROTONIX) 20 MG tablet Take 1 tablet (20 mg total) by mouth daily. 07/12/22   Janeece Agee, NP  QUEtiapine  (SEROQUEL XR) 50 MG TB24 24 hr tablet Take 1 tablet (50 mg total) by mouth at bedtime. 07/12/22   Janeece Agee, NP  thiamine 100 MG tablet Take 1 tablet (100 mg total) by mouth daily. 02/05/19   Lezlie Lye, Meda Coffee, MD  triamcinolone cream (KENALOG) 0.1 % Apply 1 application topically 2 (two) times daily as needed (rash/irritation). 07/09/21   Janeece Agee, NP  valACYclovir (VALTREX) 1000 MG tablet TAKE 2 TABLETS BY MOUTH AT SYMPTOMS ONSET ,THEN  REPEAT IN 12 HOURS 05/20/22   Janeece Agee, NP  vitamin C (ASCORBIC ACID) 250 MG tablet Take 250 mg by mouth daily.    [provider]      Allergies    Fruit & vegetable daily [nutritional supplements], Iodinated contrast media, Ibuprofen, Aloe vera, and Aspirin    Review of Systems   Review of Systems  Constitutional:  Negative for chills and fever.  Respiratory:  Negative for shortness of breath.   Cardiovascular:  Negative for chest pain.  Gastrointestinal:  Negative for abdominal pain, diarrhea and vomiting.  Musculoskeletal:  Positive for back pain. Negative for neck pain.       + Left shoulder pain  Skin:  Negative for rash.  Neurological:  Positive for numbness. Negative for weakness and headaches.       No saddle anesthesia, no incontinence    Physical Exam Updated Vital Signs BP (!) 142/109   Pulse 86   Temp 98.4 F (36.9 C)   Resp 18   Wt 46 kg   SpO2 96%   BMI 17.96 kg/m  Physical Exam Vitals and nursing note reviewed.  Constitutional:      General: She is not in acute distress.    Appearance: Normal appearance.  HENT:     Head: Normocephalic.     Mouth/Throat:     Mouth: Mucous membranes are moist.  Cardiovascular:     Rate and Rhythm: Normal rate.  Pulmonary:     Effort: Pulmonary effort is normal. No respiratory distress.  Abdominal:     Palpations: Abdomen is soft.     Tenderness: There is no abdominal tenderness.  Musculoskeletal:     Comments: Midline and minimally left paraspinal lower back  pain to palpation, extending into the left buttocks.  Positive straight leg test on the left, left leg appears weak secondary to pain.  Tenderness to palpation of the distal left clavicle and shoulder.  Left upper extremity is otherwise neurovascularly intact.  Skin:    General: Skin is warm.  Neurological:     Mental Status: She is alert and oriented to person, place, and time. Mental status is at baseline.  Psychiatric:        Mood and Affect: Mood normal.     ED Results / Procedures / Treatments   Labs (all labs ordered are listed, but only abnormal results are displayed) Labs Reviewed - No data to display  EKG  None  Radiology No results found.  Procedures Procedures    Medications Ordered in ED Medications  oxyCODONE-acetaminophen (PERCOCET/ROXICET) 5-325 MG per tablet 1 tablet (has no administration in time range)    ED Course/ Medical Decision Making/ A&P                                 Medical Decision Making Amount and/or Complexity of Data Reviewed Radiology: ordered.  Risk Prescription drug management.   62 year old female presents emergency department with acute on chronic lower back pain and left shoulder pain.  She had a mechanical fall today without head injury or loss of consciousness.  Vitals are stable on arrival.  Complaining of lower back pain with radiation down the left leg.  She is scheduled to follow-up with a spine specialist as an outpatient, reports that she recently had an MRI of and unable to find this imaging.  She does not report any red flag symptoms in regards to lower back pain.  Physical exam shows positive straight leg test on the left, she has some mild sensory discrepancy and some weakness that appears secondary to pain but no other acute finding.  X-ray of the left shoulder shows no fracture.  X-ray of the lumbar spine shows known chronic findings.  No concern for cauda equina at this time.  Patient's currently has missed a dose of  her blood pressure medicine, BP is uptrending. BP also elevated 2/2 pain. She is prescribed pain medicine and muscle relaxers as outpatient. I ordered her home medications here.   Pain improved but ongoing. After review of her medication list she is typically taking 10 mg of oxycodone, I only had ordered 5 mg.  Additional medicine ordered. I expect BP to downtrend with home medications. Patient is requesting be discharged home for her outpatient follow-up.  She has been ambulatory at baseline use the restroom without assistance.  Patient at this time appears safe and stable for discharge and close outpatient follow up. Discharge plan and strict return to ED precautions discussed, patient verbalizes understanding and agreement.        Final Clinical Impression(s) / ED Diagnoses Final diagnoses:  None    Rx / DC Orders ED Discharge Orders     None         Rozelle Logan, DO 11/06/23 2324

## 2023-11-06 NOTE — Discharge Instructions (Signed)
You have been seen and discharged from the emergency department.  Your CT and x-ray imaging showed no acute injury/fracture.  You were given pain medicine your blood pressure medicine here in the department.  I sent prescriptions for morning medicine.  Follow-up at your scheduled spine specialist appointment.  Follow-up with your primary provider for further evaluation and further care. Take home medications as prescribed. If you have any worsening symptoms or further concerns for your health please return to an emergency department for further evaluation.

## 2023-11-06 NOTE — ED Triage Notes (Addendum)
C.o left lower back pain radiating down left leg with tingling.  Pt reports falling onto left side due to pain.  Denies hitting head, denies loc.  Also c/o left shoulder pain from bracing self from fall.

## 2024-01-06 ENCOUNTER — Encounter (HOSPITAL_COMMUNITY): Payer: Self-pay | Admitting: Emergency Medicine

## 2024-01-06 ENCOUNTER — Emergency Department (HOSPITAL_COMMUNITY): Payer: 59

## 2024-01-06 ENCOUNTER — Other Ambulatory Visit: Payer: Self-pay

## 2024-01-06 ENCOUNTER — Emergency Department (HOSPITAL_COMMUNITY)
Admission: EM | Admit: 2024-01-06 | Discharge: 2024-01-06 | Disposition: A | Discharge status: Home / Self Care | Payer: 59 | Attending: Emergency Medicine | Admitting: Emergency Medicine

## 2024-01-06 DIAGNOSIS — S3210XA Unspecified fracture of sacrum, initial encounter for closed fracture: Secondary | ICD-10-CM | POA: Insufficient documentation

## 2024-01-06 DIAGNOSIS — Y92009 Unspecified place in unspecified non-institutional (private) residence as the place of occurrence of the external cause: Secondary | ICD-10-CM | POA: Insufficient documentation

## 2024-01-06 DIAGNOSIS — M5442 Lumbago with sciatica, left side: Secondary | ICD-10-CM | POA: Diagnosis not present

## 2024-01-06 DIAGNOSIS — W19XXXA Unspecified fall, initial encounter: Secondary | ICD-10-CM | POA: Insufficient documentation

## 2024-01-06 DIAGNOSIS — M25512 Pain in left shoulder: Secondary | ICD-10-CM | POA: Diagnosis not present

## 2024-01-06 DIAGNOSIS — S322XXA Fracture of coccyx, initial encounter for closed fracture: Secondary | ICD-10-CM | POA: Diagnosis not present

## 2024-01-06 DIAGNOSIS — Y9389 Activity, other specified: Secondary | ICD-10-CM | POA: Diagnosis not present

## 2024-01-06 DIAGNOSIS — S3992XA Unspecified injury of lower back, initial encounter: Secondary | ICD-10-CM | POA: Diagnosis present

## 2024-01-06 MED ORDER — OXYCODONE-ACETAMINOPHEN 5-325 MG PO TABS
1.0000 | ORAL_TABLET | Freq: Once | ORAL | Status: AC
  Administered 2024-01-06: 1 via ORAL
  Filled 2024-01-06: qty 1

## 2024-01-06 MED ORDER — OXYCODONE-ACETAMINOPHEN 5-325 MG PO TABS: 1.0000 | ORAL_TABLET | Freq: Four times a day (QID) | ORAL | 0 refills | Status: AC | PRN

## 2024-01-06 NOTE — Discharge Instructions (Addendum)
Your x-ray showed a tailbone fracture.  Please take the Percocet as needed.  Do not drive drink alcohol while taking this as it may make you drowsy.  Follow-up with your doctor.  Return to the ER for worsening symptoms.

## 2024-01-06 NOTE — ED Triage Notes (Signed)
BIB EMS from home after mechanical fall carrying in groceries. No head injury, no LOC.  No Thinners.  Fell onto [facility] floor attempting to break fall with left arm. Left backside, left leg, and left shoulder/arm pain.   Hx of L4-5 herniated disc.  Dull aching pain. VSS

## 2024-01-06 NOTE — ED Provider Notes (Signed)
I was asked to follow-up on the patient's imaging studies after a fall earlier today.  The patient's shoulder x-ray is negative.  Her lumbar x-ray shows possible coccyx fracture.  The patient will be treated symptomatically with this.  She will be discharged with return precautions. Physical Exam  BP 126/68   Pulse 73   Temp 98.3 F (36.8 C)   Resp 18   SpO2 97%   Physical Exam General: No acute distress MSK: Tender over the sacrococcygeal junction Procedures  Procedures  ED Course / MDM    Medical Decision Making Amount and/or Complexity of Data Reviewed Radiology: ordered.   The patient will be discharged with return precautions.       Durwin Glaze, MD 01/06/24 [phone]

## 2024-01-06 NOTE — ED Provider Notes (Signed)
[place] EMERGENCY DEPARTMENT AT [facility] Provider Note   CSN: [id number] Arrival date & time: 01/06/24  1516     History {Add pertinent medical, surgical, social history, OB history to HPI:1} Chief Complaint  Patient presents with   Melissa Bridges is a 63 y.o. female.  She is brought in by EMS after a fall bringing in the groceries at home.  She said her back and sciatica have been bothering her and her leg gave out causing her to fall.  She landed on her left side.  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  She also has worsening pain in her low back.  She said she has seen a spine doctor a long time ago and told her she has got an L4-5 herniated disc.  No bowel or bladder incontinence no neck pain.  No numbness or weakness.  The history is provided by the patient and the EMS personnel.  Fall This is a new problem. The current episode started 1 to 2 hours ago. The problem has not changed since onset.Pertinent negatives include no chest pain, no abdominal pain, no headaches and no shortness of breath. The symptoms are aggravated by bending and twisting. Nothing relieves the symptoms. She has tried nothing for the symptoms. The treatment provided no relief.       Home Medications Prior to Admission medications   Medication Sig Start Date End Date Taking? Authorizing Provider  acetaminophen (TYLENOL) 500 MG tablet Take 2,000-2,500 mg by mouth every 4 (four) hours as needed (pain).    [provider]  albuterol (VENTOLIN HFA) 108 (90 Base) MCG/ACT inhaler Inhale 2 puffs into the lungs every 6 (six) hours as needed for wheezing or shortness of breath. 11/06/23   Horton, Clabe Seal, DO  amLODipine (NORVASC) 5 MG tablet Take 1 tablet (5 mg total) by mouth daily. 11/06/23   Horton, Clabe Seal, DO  Blood Pressure Monitor KIT Dispense one blood pressure monitor to blood pressure 10/22/19   Stallings, Zoe  A, MD  citalopram (CELEXA) 40 MG tablet Take 1 tablet (40 mg total) by mouth daily. 07/12/22   Janeece Agee, NP  divalproex (DEPAKOTE) 500 MG DR tablet Take 1 tablet (500 mg total) by mouth 3 (three) times daily. 07/12/22   Janeece Agee, NP  ferrous sulfate 325 (65 FE) MG tablet Take by mouth. 07/05/18   [provider]  Fluticasone-Umeclidin-Vilant (TRELEGY ELLIPTA) 100-62.5-25 MCG/ACT AEPB Inhale 1 puff into the lungs daily. 08/19/22   Martina Sinner, MD  folic acid (FOLVITE) 1 MG tablet Take by mouth. 07/16/18   [provider]  gabapentin (NEURONTIN) 400 MG capsule Take 1 capsule (400 mg total) by mouth 3 (three) times daily. 07/12/22   Janeece Agee, NP  ipratropium-albuterol (DUONEB) 0.5-2.5 (3) MG/3ML SOLN Take 3 mLs by nebulization every 6 (six) hours as needed (shortness of breath). Reported on 12/02/2015 07/12/22   Janeece Agee, NP  LORazepam (ATIVAN) 0.5 MG tablet Take 1 tablet (0.5 mg total) by mouth 2 (two) times daily as needed for anxiety. 07/12/22   Janeece Agee, NP  methocarbamol (ROBAXIN) 500 MG tablet Take 1 tablet (500 mg total) by mouth 4 (four) times daily. 07/12/22   Janeece Agee, NP  mirabegron ER (MYRBETRIQ) 50 MG TB24 tablet Take 1 tablet (50 mg total) by mouth daily. 07/12/22   Janeece Agee, NP  montelukast (SINGULAIR) 10 MG tablet TAKE 1 TABLET BY  MOUTH EVERYDAY AT BEDTIME 11/06/23   Horton, Kristie M, DO  Multiple Vitamin (MULTIVITAMIN WITH MINERALS) TABS tablet Take 1 tablet by mouth daily. 07/12/22   Janeece Agee, NP  nystatin (MYCOSTATIN) 100000 UNIT/ML suspension Take 5 mLs (500,000 Units total) by mouth 4 (four) times daily. 07/09/21   Janeece Agee, NP  ondansetron (ZOFRAN) 4 MG tablet Take 1 tablet (4 mg total) by mouth every 6 (six) hours. 10/22/20   Bethann Berkshire, MD  oxyCODONE (ROXICODONE) 5 MG immediate release tablet Take 1 tablet (5 mg total) by mouth every 6 (six) hours as needed for severe pain (pain score 7-10). 11/06/23    Horton, Clabe Seal, DO  oxyCODONE-acetaminophen (PERCOCET) 10-325 MG tablet Take 1 tablet by mouth every 8 (eight) hours as needed for pain. 07/27/22   Sheliah Hatch, MD  pantoprazole (PROTONIX) 20 MG tablet Take 1 tablet (20 mg total) by mouth daily. 07/12/22   Janeece Agee, NP  QUEtiapine (SEROQUEL XR) 50 MG TB24 24 hr tablet Take 1 tablet (50 mg total) by mouth at bedtime. 07/12/22   Janeece Agee, NP  thiamine 100 MG tablet Take 1 tablet (100 mg total) by mouth daily. 02/05/19   Lezlie Lye, Meda Coffee, MD  triamcinolone cream (KENALOG) 0.1 % Apply 1 application topically 2 (two) times daily as needed (rash/irritation). 07/09/21   Janeece Agee, NP  valACYclovir (VALTREX) 1000 MG tablet TAKE 2 TABLETS BY MOUTH AT SYMPTOMS ONSET ,THEN  REPEAT IN 12 HOURS 05/20/22   Janeece Agee, NP  vitamin C (ASCORBIC ACID) 250 MG tablet Take 250 mg by mouth daily.    [provider]      Allergies    Fruit & vegetable daily [nutritional supplements], Iodinated contrast media, Ibuprofen, Aloe vera, and Aspirin    Review of Systems   Review of Systems  Constitutional:  Negative for fever.  Eyes:  Negative for visual disturbance.  Respiratory:  Negative for shortness of breath.   Cardiovascular:  Negative for chest pain.  Gastrointestinal:  Negative for abdominal pain.  Genitourinary:  Negative for dysuria.  Musculoskeletal:  Positive for back pain.  Neurological:  Negative for weakness, numbness and headaches.    Physical Exam Updated Vital Signs BP (!) 150/78 (BP Location: Right Arm)   Pulse 72   Temp 98.3 F (36.8 C)   Resp 18   SpO2 98%  Physical Exam Vitals and nursing note reviewed.  Constitutional:      General: She is not in acute distress.    Appearance: Normal appearance. She is well-developed.  HENT:     Head: Normocephalic and atraumatic.  Eyes:     Conjunctiva/sclera: Conjunctivae normal.  Cardiovascular:     Rate and Rhythm: Normal rate and regular rhythm.      Heart sounds: No murmur heard. Pulmonary:     Effort: Pulmonary effort is normal. No respiratory distress.     Breath sounds: Normal breath sounds.  Abdominal:     Palpations: Abdomen is soft.     Tenderness: There is no abdominal tenderness.  Musculoskeletal:        General: Tenderness present. No deformity.     Cervical back: Neck supple.     Right lower leg: No edema.     Left lower leg: No edema.     Comments: She has some tenderness around her left shoulder with decreased range of motion.  Elbow and wrist nontender.  Lower extremities full range of motion without any pain or limitations.  She has some  diffuse lumbar tenderness.  Skin:    General: Skin is warm and dry.     Capillary Refill: Capillary refill takes less than 2 seconds.  Neurological:     General: No focal deficit present.     Mental Status: She is alert.     Sensory: No sensory deficit.     Motor: No weakness.     ED Results / Procedures / Treatments   Labs (all labs ordered are listed, but only abnormal results are displayed) Labs Reviewed - No data to display  EKG None  Radiology No results found.  Procedures Procedures  {Document cardiac monitor, telemetry assessment procedure when appropriate:1}  Medications Ordered in ED Medications - No data to display  ED Course/ Medical Decision Making/ A&P   {   Click here for ABCD2, HEART and other calculatorsREFRESH Note before signing :1}                              Medical Decision Making Amount and/or Complexity of Data Reviewed Radiology: ordered.   This patient complains of ***; this involves an extensive number of treatment Options and is a complaint that carries with it a high risk of complications and morbidity. The differential includes ***  I ordered, reviewed and interpreted labs, which included *** I ordered medication *** and reviewed PMP when indicated. I ordered imaging studies which included *** and I independently     visualized and interpreted imaging which showed *** Additional history obtained from *** Previous records obtained and reviewed *** I consulted *** and discussed lab and imaging findings and discussed disposition.  Cardiac monitoring reviewed, *** Social determinants considered, *** Critical Interventions: ***  After the interventions stated above, I reevaluated the patient and found *** Admission and further testing considered, ***   {Document critical care time when appropriate:1} {Document review of labs and clinical decision tools ie heart score, Chads2Vasc2 etc:1}  {Document your independent review of radiology images, and any outside records:1} {Document your discussion with family members, caretakers, and with consultants:1} {Document social determinants of health affecting pt's care:1} {Document your decision making why or why not admission, treatments were needed:1} Final Clinical Impression(s) / ED Diagnoses Final diagnoses:  None    Rx / DC Orders ED Discharge Orders     None

## 2024-01-06 NOTE — ED Notes (Signed)
Pt was given a drink and a [place] sand which,

## 2024-01-12 ENCOUNTER — Other Ambulatory Visit: Payer: Self-pay | Admitting: Pulmonary Disease

## 2024-01-12 DIAGNOSIS — J432 Centrilobular emphysema: Secondary | ICD-10-CM

## 2024-02-01 ENCOUNTER — Other Ambulatory Visit: Payer: Self-pay | Admitting: Pulmonary Disease

## 2024-02-01 DIAGNOSIS — J432 Centrilobular emphysema: Secondary | ICD-10-CM

## 2024-02-20 ENCOUNTER — Encounter: Payer: Self-pay | Admitting: Pulmonary Disease

## 2024-03-08 ENCOUNTER — Emergency Department (HOSPITAL_COMMUNITY)

## 2024-03-08 ENCOUNTER — Encounter (HOSPITAL_COMMUNITY): Payer: Self-pay

## 2024-03-08 ENCOUNTER — Emergency Department (HOSPITAL_COMMUNITY)
Admission: EM | Admit: 2024-03-08 | Discharge: 2024-03-08 | Disposition: A | Discharge status: Home / Self Care | Attending: Emergency Medicine | Admitting: Emergency Medicine

## 2024-03-08 ENCOUNTER — Other Ambulatory Visit: Payer: Self-pay

## 2024-03-08 DIAGNOSIS — M25561 Pain in right knee: Secondary | ICD-10-CM | POA: Diagnosis not present

## 2024-03-08 DIAGNOSIS — Z79899 Other long term (current) drug therapy: Secondary | ICD-10-CM | POA: Diagnosis not present

## 2024-03-08 DIAGNOSIS — M545 Low back pain, unspecified: Secondary | ICD-10-CM

## 2024-03-08 DIAGNOSIS — J45909 Unspecified asthma, uncomplicated: Secondary | ICD-10-CM | POA: Insufficient documentation

## 2024-03-08 DIAGNOSIS — I10 Essential (primary) hypertension: Secondary | ICD-10-CM | POA: Insufficient documentation

## 2024-03-08 DIAGNOSIS — J449 Chronic obstructive pulmonary disease, unspecified: Secondary | ICD-10-CM | POA: Diagnosis not present

## 2024-03-08 DIAGNOSIS — Z7951 Long term (current) use of inhaled steroids: Secondary | ICD-10-CM | POA: Insufficient documentation

## 2024-03-08 MED ORDER — MELOXICAM 7.5 MG PO TABS: 7.5000 mg | ORAL_TABLET | Freq: Every day | ORAL | 0 refills | Status: AC

## 2024-03-08 MED ORDER — HYDROCODONE-ACETAMINOPHEN 5-325 MG PO TABS
1.0000 | ORAL_TABLET | Freq: Once | ORAL | Status: AC
  Administered 2024-03-08: 1 via ORAL
  Filled 2024-03-08: qty 1

## 2024-03-08 NOTE — ED Provider Notes (Signed)
 [place] [facility]ERGENCY DEPARTMENT AT [facility] Provider Note   CSN: [id number] Arrival date & time: 03/08/24  1028     History  Chief Complaint  Patient presents with   Back Pain   HPI Melissa Bridges is a 63 y.o. female with history of asthma, COPD, schizoaffective disorder, seizures, hypertension presenting for back pain.  States it has been going on intermittently for about 2 months.  Located in the left lower back extends down to the left back of her leg at times.  It is a sharp pain that is worse with movement.  Denies any pain in the midline.  Denies urinary symptoms.  Denies saddle anesthesia.  Denies any bowel changes.  States she has been taking Tylenol, Paxil and gabapentin which have not helped.  Also reporting right knee pain that started about a week ago.  Denies any trauma to the right knee.  Stay she still ambulating and bearing weight without assistance.  Denies any new trauma to the back.   Back Pain      Home Medications Prior to Admission medications   Medication Sig Start Date End Date Taking? Authorizing Provider  meloxicam (MOBIC) 7.5 MG tablet Take 1 tablet (7.5 mg total) by mouth daily for 7 days. 03/08/24 03/15/24 Yes Gareth Eagle, PA-C  acetaminophen (TYLENOL) 500 MG tablet Take 2,000-2,500 mg by mouth every 4 (four) hours as needed (pain).    [provider]  albuterol (VENTOLIN HFA) 108 (90 Base) MCG/ACT inhaler Inhale 2 puffs into the lungs every 6 (six) hours as needed for wheezing or shortness of breath. 11/06/23   Horton, Clabe Seal, DO  amLODipine (NORVASC) 5 MG tablet Take 1 tablet (5 mg total) by mouth daily. 11/06/23   Horton, Clabe Seal, DO  Blood Pressure Monitor KIT Dispense one blood pressure monitor to blood pressure 10/22/19   Stallings, Zoe A, MD  citalopram (CELEXA) 40 MG tablet Take 1 tablet (40 mg total) by mouth daily. 07/12/22   Janeece Agee, NP  divalproex (DEPAKOTE) 500 MG DR tablet Take 1 tablet (500 mg  total) by mouth 3 (three) times daily. 07/12/22   Janeece Agee, NP  ferrous sulfate 325 (65 FE) MG tablet Take by mouth. 07/05/18   [provider]  Fluticasone-Umeclidin-Vilant (TRELEGY ELLIPTA) 100-62.5-25 MCG/ACT AEPB Inhale 1 puff into the lungs daily. 08/19/22   Martina Sinner, MD  folic acid (FOLVITE) 1 MG tablet Take by mouth. 07/16/18   [provider]  gabapentin (NEURONTIN) 400 MG capsule Take 1 capsule (400 mg total) by mouth 3 (three) times daily. 07/12/22   Janeece Agee, NP  ipratropium-albuterol (DUONEB) 0.5-2.5 (3) MG/3ML SOLN Take 3 mLs by nebulization every 6 (six) hours as needed (shortness of breath). Reported on 12/02/2015 07/12/22   Janeece Agee, NP  LORazepam (ATIVAN) 0.5 MG tablet Take 1 tablet (0.5 mg total) by mouth 2 (two) times daily as needed for anxiety. 07/12/22   Janeece Agee, NP  methocarbamol (ROBAXIN) 500 MG tablet Take 1 tablet (500 mg total) by mouth 4 (four) times daily. 07/12/22   Janeece Agee, NP  mirabegron ER (MYRBETRIQ) 50 MG TB24 tablet Take 1 tablet (50 mg total) by mouth daily. 07/12/22   Janeece Agee, NP  montelukast (SINGULAIR) 10 MG tablet TAKE 1 TABLET BY MOUTH EVERYDAY AT BEDTIME 11/06/23   Horton, Kristie M, DO  Multiple Vitamin (MULTIVITAMIN WITH MINERALS) TABS tablet Take 1 tablet by mouth daily. 07/12/22   Janeece Agee, NP  nystatin (MYCOSTATIN) 100000  UNIT/ML suspension Take 5 mLs (500,000 Units total) by mouth 4 (four) times daily. 07/09/21   Janeece Agee, NP  ondansetron (ZOFRAN) 4 MG tablet Take 1 tablet (4 mg total) by mouth every 6 (six) hours. 10/22/20   Bethann Berkshire, MD  oxyCODONE (ROXICODONE) 5 MG immediate release tablet Take 1 tablet (5 mg total) by mouth every 6 (six) hours as needed for severe pain (pain score 7-10). 11/06/23   Horton, Clabe Seal, DO  oxyCODONE-acetaminophen (PERCOCET) 10-325 MG tablet Take 1 tablet by mouth every 8 (eight) hours as needed for pain. 07/27/22   Sheliah Hatch, MD   oxyCODONE-acetaminophen (PERCOCET/ROXICET) 5-325 MG tablet Take 1 tablet by mouth every 6 (six) hours as needed for severe pain (pain score 7-10). 01/06/24   Durwin Glaze, MD  pantoprazole (PROTONIX) 20 MG tablet Take 1 tablet (20 mg total) by mouth daily. 07/12/22   Janeece Agee, NP  QUEtiapine (SEROQUEL XR) 50 MG TB24 24 hr tablet Take 1 tablet (50 mg total) by mouth at bedtime. 07/12/22   Janeece Agee, NP  thiamine 100 MG tablet Take 1 tablet (100 mg total) by mouth daily. 02/05/19   Lezlie Lye, Meda Coffee, MD  triamcinolone cream (KENALOG) 0.1 % Apply 1 application topically 2 (two) times daily as needed (rash/irritation). 07/09/21   Janeece Agee, NP  valACYclovir (VALTREX) 1000 MG tablet TAKE 2 TABLETS BY MOUTH AT SYMPTOMS ONSET ,THEN  REPEAT IN 12 HOURS 05/20/22   Janeece Agee, NP  vitamin C (ASCORBIC ACID) 250 MG tablet Take 250 mg by mouth daily.    [provider]      Allergies    Fruit & vegetable daily [nutritional supplements], Iodinated contrast media, Ibuprofen, Aloe vera, and Aspirin    Review of Systems   Review of Systems  Musculoskeletal:  Positive for back pain.    Physical Exam Updated Vital Signs BP (!) 164/85 (BP Location: Left Arm)   Pulse 77   Temp 98 F (36.7 C) (Oral)   Resp 18   Ht 5' 3.75" (1.619 m)   Wt 49 kg   SpO2 99%   BMI 18.68 kg/m  Physical Exam Constitutional:      Appearance: Normal appearance.  HENT:     Head: Normocephalic.     Nose: Nose normal.  Eyes:     Conjunctiva/sclera: Conjunctivae normal.  Pulmonary:     Effort: Pulmonary effort is normal.  Musculoskeletal:       Back:     Right knee: Tenderness (generalized over the joint) present. Normal pulse.     Left knee: Normal pulse.  Neurological:     Mental Status: She is alert.  Psychiatric:        Mood and Affect: Mood normal.     ED Results / Procedures / Treatments   Labs (all labs ordered are listed, but only abnormal results are displayed) Labs  Reviewed - No data to display  EKG None  Radiology DG Knee Complete 4 Views Right Result Date: 03/08/2024 CLINICAL DATA:  Right knee pain. EXAM: RIGHT KNEE - COMPLETE 4+ VIEW COMPARISON:  None Available. FINDINGS: No evidence of fracture, dislocation, or joint effusion. Minimal tricompartmental osteophytosis. Soft tissues are unremarkable. IMPRESSION: 1. No acute osseous abnormality. 2. Minimal tricompartmental degenerative changes. Electronically Signed   By: Hart Robinsons M.D.   On: 03/08/2024 11:55    Procedures Procedures    Medications Ordered in ED Medications  HYDROcodone-acetaminophen (NORCO/VICODIN) 5-325 MG per tablet 1 tablet (has no administration in  time range)    ED Course/ Medical Decision Making/ A&P                                 Medical Decision Making Amount and/or Complexity of Data Reviewed Radiology: ordered.   63 year old well-appearing female presenting for back pain right knee pain.  Exam was unremarkable.  Suspect this is pain related to recent fall.  Considered more sinister pathology for her back pain but unlikely given lack of red flag symptoms and the pain has been the same and intermittent since her fall.  Inspection of her right knee was grossly reassuring.  X-ray was negative.  Patient reported an intolerance to ibuprofen but per her chart she has been on meloxicam in the past and tolerated it well.  I sent a 5-day course of meloxicam to her pharmacy.  Advised her to continue taking her naproxen and gabapentin as prescribed to her.  Advised her to follow-up her PCP.  Discussed return precautions.  Discharged in good condition.        Final Clinical Impression(s) / ED Diagnoses Final diagnoses:  Low back pain, unspecified back pain laterality, unspecified chronicity, unspecified whether sciatica present  Right knee pain, unspecified chronicity    Rx / DC Orders ED Discharge Orders          Ordered    meloxicam (MOBIC) 7.5 MG tablet   Daily        03/08/24 1204              Vaughan Browner 03/08/24 1205    Benjiman Core, MD 03/08/24 1452

## 2024-03-08 NOTE — Discharge Instructions (Addendum)
 Evaluation today was reassuring.  I sent a 7-day course of meloxicam to your pharmacy.  X-ray of the right knee was negative.  I suspect the pain you are experiencing is related to your fall and also sciatica.  Continue taking the Robaxin and gabapentin as prescribed to you.  Please follow-up with PCP.

## 2024-03-08 NOTE — ED Triage Notes (Signed)
 Patient has left sided lower back pain that moves down her left leg for 2 weeks. Complaining of right knee pain. Ambulatory on arrival. Takes robaxin and gabapentin but stated it is not working.
